# Patient Record
Sex: Female | Born: 1960 | Race: White | Hispanic: No | Marital: Married | Smoking: Former smoker
Health system: Southern US, Community
[De-identification: ages and names within clinical notes are randomized; demographics above are authoritative.]

## PROBLEM LIST (undated history)

## (undated) DIAGNOSIS — J189 Pneumonia, unspecified organism: Secondary | ICD-10-CM

## (undated) DIAGNOSIS — Z5189 Encounter for other specified aftercare: Secondary | ICD-10-CM

## (undated) DIAGNOSIS — I639 Cerebral infarction, unspecified: Secondary | ICD-10-CM

## (undated) DIAGNOSIS — C50919 Malignant neoplasm of unspecified site of unspecified female breast: Secondary | ICD-10-CM

## (undated) DIAGNOSIS — E119 Type 2 diabetes mellitus without complications: Secondary | ICD-10-CM

## (undated) DIAGNOSIS — F419 Anxiety disorder, unspecified: Secondary | ICD-10-CM

## (undated) DIAGNOSIS — K219 Gastro-esophageal reflux disease without esophagitis: Secondary | ICD-10-CM

## (undated) DIAGNOSIS — R87629 Unspecified abnormal cytological findings in specimens from vagina: Secondary | ICD-10-CM

## (undated) DIAGNOSIS — M1990 Unspecified osteoarthritis, unspecified site: Secondary | ICD-10-CM

## (undated) DIAGNOSIS — Z87442 Personal history of urinary calculi: Secondary | ICD-10-CM

## (undated) DIAGNOSIS — I10 Essential (primary) hypertension: Secondary | ICD-10-CM

## (undated) DIAGNOSIS — R011 Cardiac murmur, unspecified: Secondary | ICD-10-CM

## (undated) DIAGNOSIS — T7840XA Allergy, unspecified, initial encounter: Secondary | ICD-10-CM

## (undated) HISTORY — DX: Allergy, unspecified, initial encounter: T78.40XA

## (undated) HISTORY — DX: Cerebral infarction, unspecified: I63.9

## (undated) HISTORY — DX: Type 2 diabetes mellitus without complications: E11.9

## (undated) HISTORY — DX: Encounter for other specified aftercare: Z51.89

## (undated) HISTORY — DX: Unspecified osteoarthritis, unspecified site: M19.90

## (undated) HISTORY — DX: Essential (primary) hypertension: I10

## (undated) HISTORY — DX: Unspecified abnormal cytological findings in specimens from vagina: R87.629

## (undated) HISTORY — PX: CHOLECYSTECTOMY: SHX55

## (undated) HISTORY — DX: Malignant neoplasm of unspecified site of unspecified female breast: C50.919

---

## 1998-09-02 ENCOUNTER — Other Ambulatory Visit: Admission: RE | Admit: 1998-09-02 | Discharge: 1998-09-02 | Payer: Self-pay | Admitting: *Deleted

## 2000-04-24 ENCOUNTER — Other Ambulatory Visit: Admission: RE | Admit: 2000-04-24 | Discharge: 2000-04-24 | Payer: Self-pay | Admitting: Family Medicine

## 2001-06-12 ENCOUNTER — Other Ambulatory Visit: Admission: RE | Admit: 2001-06-12 | Discharge: 2001-06-12 | Payer: Self-pay | Admitting: Family Medicine

## 2012-08-05 ENCOUNTER — Encounter: Payer: Self-pay | Admitting: Nurse Practitioner

## 2014-05-26 ENCOUNTER — Telehealth: Payer: Self-pay | Admitting: Family Medicine

## 2014-05-26 NOTE — Telephone Encounter (Signed)
Stp and she is coughing a lot, wtbs today, advised no appts available today offered tomorrow but pt states she will go to the urgent care.

## 2014-11-16 ENCOUNTER — Telehealth: Payer: Self-pay | Admitting: Family Medicine

## 2014-11-16 NOTE — Telephone Encounter (Signed)
Appointment scheduled for 8/3 @ 1:10pm with [facility]. Patient has [facility] and is currently on no medications except doxycycline.

## 2014-12-08 ENCOUNTER — Ambulatory Visit (INDEPENDENT_AMBULATORY_CARE_PROVIDER_SITE_OTHER): Payer: PRIVATE HEALTH INSURANCE | Admitting: Family Medicine

## 2014-12-08 ENCOUNTER — Encounter (INDEPENDENT_AMBULATORY_CARE_PROVIDER_SITE_OTHER): Payer: Self-pay

## 2014-12-08 ENCOUNTER — Encounter: Payer: Self-pay | Admitting: Family Medicine

## 2014-12-08 VITALS — BP 238/142 | HR 105 | Temp 97.5°F | Ht 61.5 in | Wt 150.8 lb

## 2014-12-08 DIAGNOSIS — S3091XA Unspecified superficial injury of lower back and pelvis, initial encounter: Secondary | ICD-10-CM | POA: Diagnosis not present

## 2014-12-08 DIAGNOSIS — R0600 Dyspnea, unspecified: Secondary | ICD-10-CM

## 2014-12-08 DIAGNOSIS — I10 Essential (primary) hypertension: Secondary | ICD-10-CM | POA: Diagnosis not present

## 2014-12-08 DIAGNOSIS — T148 Other injury of unspecified body region: Secondary | ICD-10-CM | POA: Diagnosis not present

## 2014-12-08 DIAGNOSIS — W57XXXA Bitten or stung by nonvenomous insect and other nonvenomous arthropods, initial encounter: Secondary | ICD-10-CM | POA: Diagnosis not present

## 2014-12-08 LAB — POCT CBC
Granulocyte percent: 79.7 %G (ref 37–80)
HEMATOCRIT: 48.4 % — AB (ref 37.7–47.9)
Hemoglobin: 15.8 g/dL (ref 12.2–16.2)
Lymph, poc: 1.9 (ref 0.6–3.4)
MCH, POC: 29.4 pg (ref 27–31.2)
MCHC: 32.8 g/dL (ref 31.8–35.4)
MCV: 89.9 fL (ref 80–97)
MPV: 8.1 fL (ref 0–99.8)
PLATELET COUNT, POC: 240 10*3/uL (ref 142–424)
POC Granulocyte: 8.5 — AB (ref 2–6.9)
POC LYMPH PERCENT: 18.2 %L (ref 10–50)
RBC: 5.38 M/uL (ref 4.04–5.48)
RDW, POC: 12.5 %
WBC: 10.7 10*3/uL — AB (ref 4.6–10.2)

## 2014-12-08 MED ORDER — OLMESARTAN MEDOXOMIL-HCTZ 40-25 MG PO TABS: 1.0000 | ORAL_TABLET | Freq: Every day | ORAL | Status: DC

## 2014-12-08 NOTE — Progress Notes (Signed)
Subjective:  Patient ID: Marilyn Ford, female    DOB: 15-Aug-1960  Age: 54 y.o. MRN: [id number]  CC: Establish Care and Insect Bite   HPI Marieta Ford presents for Tick bite 4 weeks ago.Concerned about possibility of LYmes dx. Describes engorged dog tick. Irritated anad painful at time of removal. Now slight itch only at site on upper back on the right.   Also needs follow-up of hypertension. Patient has no history of headache chest pain or recent cough. Patient also denies symptoms of TIA such as numbness weakness lateralizing. Patient not checking blood pressure at home. Off all meds.   She is a smoker and states that she is not interested in quitting right now. She gets winded rather easily walking a block or more. Sits down to rest frequently.   History Marilyn Ford has a past medical history of Hypertension.   She has past surgical history that includes Cholecystectomy and Cesarean section.   Her family history includes Diabetes in her brother.She reports that she has been smoking Cigarettes.  She started smoking about 15 years ago. She has been smoking about 0.50 packs per day. She does not have any smokeless tobacco history on file. She reports that she does not drink alcohol or use illicit drugs.  No current outpatient prescriptions on file prior to visit.   No current facility-administered medications on file prior to visit.    ROS Review of Systems  Constitutional: Negative for fever, chills, diaphoresis, appetite change, fatigue and unexpected weight change.  HENT: Negative for congestion, ear pain, hearing loss, postnasal drip, rhinorrhea, sneezing, sore throat and trouble swallowing.   Eyes: Negative for pain.  Respiratory: Positive for cough and shortness of breath. Negative for chest tightness.   Cardiovascular: Negative for chest pain and palpitations.  Gastrointestinal: Negative for nausea, vomiting, abdominal pain, diarrhea and constipation.  Genitourinary: Negative  for dysuria, frequency and menstrual problem.  Musculoskeletal: Negative for joint swelling and arthralgias.  Skin: Positive for wound (minimal hyperemia at site of right posterior shoulder tick bite.). Negative for rash.  Neurological: Negative for dizziness, weakness, numbness and headaches.  Psychiatric/Behavioral: Negative for dysphoric mood and agitation.    Objective:  BP 238/142 mmHg  Pulse 105  Temp(Src) 97.5 F (36.4 C) (Oral)  Ht 5' 1.5" (1.562 m)  Wt 150 lb 12.8 oz (68.402 kg)  BMI 28.04 kg/m2 .ext Physical Exam  Constitutional: She is oriented to person, place, and time. She appears well-developed and well-nourished. No distress.  HENT:  Head: Normocephalic and atraumatic.  Right Ear: External ear normal.  Left Ear: External ear normal.  Nose: Nose normal.  Mouth/Throat: Oropharynx is clear and moist.  Eyes: Conjunctivae and EOM are normal. Pupils are equal, round, and reactive to light.  Neck: Normal range of motion. Neck supple. No thyromegaly present.  Cardiovascular: Normal rate, regular rhythm and normal heart sounds.   No murmur heard. Pulmonary/Chest: Effort normal and breath sounds normal. No respiratory distress. She has no wheezes. She has no rales.  Abdominal: Soft. Bowel sounds are normal. She exhibits no distension. There is no tenderness.  Lymphadenopathy:    She has no cervical adenopathy.  Neurological: She is alert and oriented to person, place, and time. She has normal reflexes.  Skin: Skin is warm and dry.  Psychiatric: She has a normal mood and affect. Her behavior is normal. Judgment and thought content normal.    Assessment & Plan:   Kura was seen today for establish care and insect bite.  Diagnoses  and all orders for this visit:  Severe uncontrolled hypertension Orders: -     CMP14+EGFR  Dyspnea Orders: -     PR BREATHING CAPACITY TEST -     CMP14+EGFR  Tick bite Orders: -     POCT CBC -     CMP14+EGFR -     Lyme Ab/Western  Blot Reflex  Other orders -     Discontinue: olmesartan-hydrochlorothiazide (BENICAR HCT) 40-25 MG per tablet; Take 1 tablet by mouth daily.   I am having Ms. Mies maintain her Naproxen-Esomeprazole and multivitamin.  Meds ordered this encounter  Medications  . Naproxen-Esomeprazole 500-20 MG TBEC    Sig: Take 1 tablet by mouth 2 (two) times daily.  . Multiple Vitamin (MULTIVITAMIN) capsule    Sig: Take 1 capsule by mouth daily.  [name] DISCONTD: olmesartan-hydrochlorothiazide (BENICAR HCT) 40-25 MG per tablet    Sig: Take 1 tablet by mouth daily.    Dispense:  30 tablet    Refill:  1   Handout for DASH eating plan and information regarding HTN Given.   Insurance didn't cover benicar so diovan prescribed as a substitute.  Follow-up: Return in about 1 week (around 12/15/2014).  Marilyn Ford, M.D.

## 2014-12-08 NOTE — Patient Instructions (Signed)
DASH Eating Plan DASH stands for "Dietary Approaches to Stop Hypertension." The DASH eating plan is a healthy eating plan that has been shown to reduce high blood pressure (hypertension). Additional health benefits may include reducing the risk of type 2 diabetes mellitus, heart disease, and stroke. The DASH eating plan may also help with weight loss. WHAT DO I NEED TO KNOW ABOUT THE DASH EATING PLAN? For the DASH eating plan, you will follow these general guidelines:  Choose foods with a percent daily value for sodium of less than 5% (as listed on the food label).  Use salt-free seasonings or herbs instead of table salt or sea salt.  Check with your health care provider or pharmacist before using salt substitutes.  Eat lower-sodium products, often labeled as "lower sodium" or "no salt added."  Eat fresh foods.  Eat more vegetables, fruits, and low-fat dairy products.  Choose whole grains. Look for the word "whole" as the first word in the ingredient list.  Choose fish and skinless chicken or turkey more often than red meat. Limit fish, poultry, and meat to 6 oz (170 g) each day.  Limit sweets, desserts, sugars, and sugary drinks.  Choose heart-healthy fats.  Limit cheese to 1 oz (28 g) per day.  Eat more home-cooked food and less restaurant, buffet, and fast food.  Limit fried foods.  Cook foods using methods other than frying.  Limit canned vegetables. If you do use them, rinse them well to decrease the sodium.  When eating at a restaurant, ask that your food be prepared with less salt, or no salt if possible. WHAT FOODS CAN I EAT? Seek help from a dietitian for individual calorie needs. Grains Whole grain or whole wheat bread. Brown rice. Whole grain or whole wheat pasta. Quinoa, bulgur, and whole grain cereals. Low-sodium cereals. Corn or whole wheat flour tortillas. Whole grain cornbread. Whole grain crackers. Low-sodium crackers. Vegetables Fresh or frozen vegetables  (raw, steamed, roasted, or grilled). Low-sodium or reduced-sodium tomato and vegetable juices. Low-sodium or reduced-sodium tomato sauce and paste. Low-sodium or reduced-sodium canned vegetables.  Fruits All fresh, canned (in natural juice), or frozen fruits. Meat and Other Protein Products Ground beef (85% or leaner), grass-fed beef, or beef trimmed of fat. Skinless chicken or turkey. Ground chicken or turkey. Pork trimmed of fat. All fish and seafood. Eggs. Dried beans, peas, or lentils. Unsalted nuts and seeds. Unsalted canned beans. Dairy Low-fat dairy products, such as skim or 1% milk, 2% or reduced-fat cheeses, low-fat ricotta or cottage cheese, or plain low-fat yogurt. Low-sodium or reduced-sodium cheeses. Fats and Oils Tub margarines without trans fats. Light or reduced-fat mayonnaise and salad dressings (reduced sodium). Avocado. Safflower, olive, or canola oils. Natural peanut or almond butter. Other Unsalted popcorn and pretzels. The items listed above may not be a complete list of recommended foods or beverages. Contact your dietitian for more options. WHAT FOODS ARE NOT RECOMMENDED? Grains White bread. White pasta. White rice. Refined cornbread. Bagels and croissants. Crackers that contain trans fat. Vegetables Creamed or fried vegetables. Vegetables in a cheese sauce. Regular canned vegetables. Regular canned tomato sauce and paste. Regular tomato and vegetable juices. Fruits Dried fruits. Canned fruit in light or heavy syrup. Fruit juice. Meat and Other Protein Products Fatty cuts of meat. Ribs, chicken wings, bacon, sausage, bologna, salami, chitterlings, fatback, hot dogs, bratwurst, and packaged luncheon meats. Salted nuts and seeds. Canned beans with salt. Dairy Whole or 2% milk, cream, half-and-half, and cream cheese. Whole-fat or sweetened yogurt. Full-fat   cheeses or blue cheese. Nondairy creamers and whipped toppings. Processed cheese, cheese spreads, or cheese  curds. Condiments Onion and garlic salt, seasoned salt, table salt, and sea salt. Canned and packaged gravies. Worcestershire sauce. Tartar sauce. Barbecue sauce. Teriyaki sauce. Soy sauce, including reduced sodium. Steak sauce. Fish sauce. Oyster sauce. Cocktail sauce. Horseradish. Ketchup and mustard. Meat flavorings and tenderizers. Bouillon cubes. Hot sauce. Tabasco sauce. Marinades. Taco seasonings. Relishes. Fats and Oils Butter, stick margarine, lard, shortening, ghee, and bacon fat. Coconut, palm kernel, or palm oils. Regular salad dressings. Other Pickles and olives. Salted popcorn and pretzels. The items listed above may not be a complete list of foods and beverages to avoid. Contact your dietitian for more information. WHERE CAN I FIND MORE INFORMATION? National Heart, Lung, and Blood Institute: www.nhlbi.nih.gov/health/health-topics/topics/dash/ Document Released: 04/12/2011 Document Revised: 09/07/2013 Document Reviewed: 02/25/2013 ExitCare Patient Information 2015 [employer]. This information is not intended to replace advice given to you by your health care provider. Make sure you discuss any questions you have with your health care provider. Hypertension Hypertension, commonly called high blood pressure, is when the force of blood pumping through your arteries is too strong. Your arteries are the blood vessels that carry blood from your heart throughout your body. A blood pressure reading consists of a higher number over a lower number, such as 110/72. The higher number (systolic) is the pressure inside your arteries when your heart pumps. The lower number (diastolic) is the pressure inside your arteries when your heart relaxes. Ideally you want your blood pressure below 120/80. Hypertension forces your heart to work harder to pump blood. Your arteries may become narrow or stiff. Having hypertension puts you at risk for heart disease, stroke, and other problems.  RISK  FACTORS Some risk factors for high blood pressure are controllable. Others are not.  Risk factors you cannot control include:   Race. You may be at higher risk if you are African American.  Age. Risk increases with age.  Gender. Men are at higher risk than women before age 45 years. After age 65, women are at higher risk than men. Risk factors you can control include:  Not getting enough exercise or physical activity.  Being overweight.  Getting too much fat, sugar, calories, or salt in your diet.  Drinking too much alcohol. SIGNS AND SYMPTOMS Hypertension does not usually cause signs or symptoms. Extremely high blood pressure (hypertensive crisis) may cause headache, anxiety, shortness of breath, and nosebleed. DIAGNOSIS  To check if you have hypertension, your health care provider will measure your blood pressure while you are seated, with your arm held at the level of your heart. It should be measured at least twice using the same arm. Certain conditions can cause a difference in blood pressure between your right and left arms. A blood pressure reading that is higher than normal on one occasion does not mean that you need treatment. If one blood pressure reading is high, ask your health care provider about having it checked again. TREATMENT  Treating high blood pressure includes making lifestyle changes and possibly taking medicine. Living a healthy lifestyle can help lower high blood pressure. You may need to change some of your habits. Lifestyle changes may include:  Following the DASH diet. This diet is high in fruits, vegetables, and whole grains. It is low in salt, red meat, and added sugars.  Getting at least 2 hours of brisk physical activity every week.  Losing weight if necessary.  Not smoking.  Limiting   alcoholic beverages.  Learning ways to reduce stress. If lifestyle changes are not enough to get your blood pressure under control, your health care provider may  prescribe medicine. You may need to take more than one. Work closely with your health care provider to understand the risks and benefits. HOME CARE INSTRUCTIONS  Have your blood pressure rechecked as directed by your health care provider.   Take medicines only as directed by your health care provider. Follow the directions carefully. Blood pressure medicines must be taken as prescribed. The medicine does not work as well when you skip doses. Skipping doses also puts you at risk for problems.   Do not smoke.   Monitor your blood pressure at home as directed by your health care provider. SEEK MEDICAL CARE IF:   You think you are having a reaction to medicines taken.  You have recurrent headaches or feel dizzy.  You have swelling in your ankles.  You have trouble with your vision. SEEK IMMEDIATE MEDICAL CARE IF:  You develop a severe headache or confusion.  You have unusual weakness, numbness, or feel faint.  You have severe chest or abdominal pain.  You vomit repeatedly.  You have trouble breathing. MAKE SURE YOU:   Understand these instructions.  Will watch your condition.  Will get help right away if you are not doing well or get worse. Document Released: 04/23/2005 Document Revised: 09/07/2013 Document Reviewed: 02/13/2013 ExitCare Patient Information 2015 [employer]. This information is not intended to replace advice given to you by your health care provider. Make sure you discuss any questions you have with your health care provider.  

## 2014-12-09 ENCOUNTER — Telehealth: Payer: Self-pay | Admitting: Family Medicine

## 2014-12-09 LAB — CMP14+EGFR
A/G RATIO: 1.3 (ref 1.1–2.5)
ALT: 19 IU/L (ref 0–32)
AST: 14 IU/L (ref 0–40)
Albumin: 3.9 g/dL (ref 3.5–5.5)
Alkaline Phosphatase: 129 IU/L — ABNORMAL HIGH (ref 39–117)
BILIRUBIN TOTAL: 0.4 mg/dL (ref 0.0–1.2)
BUN/Creatinine Ratio: 18 (ref 9–23)
BUN: 13 mg/dL (ref 6–24)
CHLORIDE: 93 mmol/L — AB (ref 97–108)
CO2: 27 mmol/L (ref 18–29)
CREATININE: 0.74 mg/dL (ref 0.57–1.00)
Calcium: 9.5 mg/dL (ref 8.7–10.2)
GFR calc non Af Amer: 93 mL/min/{1.73_m2} (ref >59)
GFR, EST AFRICAN AMERICAN: 107 mL/min/{1.73_m2} (ref >59)
Globulin, Total: 2.9 g/dL (ref 1.5–4.5)
Glucose: 224 mg/dL — ABNORMAL HIGH (ref 65–99)
POTASSIUM: 3.8 mmol/L (ref 3.5–5.2)
Sodium: 141 mmol/L (ref 134–144)
TOTAL PROTEIN: 6.8 g/dL (ref 6.0–8.5)

## 2014-12-09 LAB — LYME AB/WESTERN BLOT REFLEX
LYME DISEASE AB, QUANT, IGM: <0.8 index (ref 0.00–0.79)
Lyme IgG/IgM Ab: <0.91 {ISR} (ref 0.00–0.90)

## 2014-12-09 NOTE — Telephone Encounter (Signed)
Dr Livia Snellen can you address and then route to pool. Pt aware that she will hear from [place] on friday

## 2014-12-10 ENCOUNTER — Other Ambulatory Visit: Payer: Self-pay | Admitting: Family Medicine

## 2014-12-10 MED ORDER — VALSARTAN-HYDROCHLOROTHIAZIDE 160-25 MG PO TABS: 1.0000 | ORAL_TABLET | Freq: Every day | ORAL | Status: DC

## 2014-12-10 NOTE — Telephone Encounter (Signed)
Replacement sent to [facility]

## 2014-12-14 LAB — POCT GLYCOSYLATED HEMOGLOBIN (HGB A1C): HEMOGLOBIN A1C: 6.8

## 2014-12-14 NOTE — Addendum Note (Signed)
Addended by: Wyline Mood on: 12/14/2014 08:53 AM   Modules accepted: Orders

## 2014-12-15 ENCOUNTER — Ambulatory Visit (INDEPENDENT_AMBULATORY_CARE_PROVIDER_SITE_OTHER): Payer: PRIVATE HEALTH INSURANCE | Admitting: Family Medicine

## 2014-12-15 VITALS — BP 194/110 | HR 98 | Temp 97.4°F | Ht 62.0 in | Wt 151.0 lb

## 2014-12-15 DIAGNOSIS — E1069 Type 1 diabetes mellitus with other specified complication: Secondary | ICD-10-CM

## 2014-12-15 DIAGNOSIS — E108 Type 1 diabetes mellitus with unspecified complications: Secondary | ICD-10-CM

## 2014-12-15 DIAGNOSIS — I10 Essential (primary) hypertension: Secondary | ICD-10-CM

## 2014-12-15 DIAGNOSIS — E1065 Type 1 diabetes mellitus with hyperglycemia: Secondary | ICD-10-CM

## 2014-12-15 DIAGNOSIS — IMO0002 Reserved for concepts with insufficient information to code with codable children: Secondary | ICD-10-CM

## 2014-12-15 MED ORDER — METFORMIN HCL ER 500 MG PO TB24: 500.0000 mg | ORAL_TABLET | Freq: Every day | ORAL | Status: DC

## 2014-12-15 MED ORDER — AMLODIPINE BESYLATE 5 MG PO TABS: 5.0000 mg | ORAL_TABLET | Freq: Every day | ORAL | Status: DC

## 2014-12-15 NOTE — Progress Notes (Signed)
Subjective:  Patient ID: Marilyn Ford, female    DOB: 03/12/1961  Age: 54 y.o. MRN: [id number]  CC: Hypertension and Diabetes   HPI Marilyn Ford presents for  follow-up of hypertension. Patient has no history of headache chest pain or shortness of breath or recent cough. Patient also denies symptoms of TIA such as numbness weakness lateralizing. Patient checks  blood pressure at home and has not had any elevated readings recently. Patient denies side effects from his medication. States taking it regularly.  Patient was noted to have an elevated glucose on her lab evaluation at her previous visit. This was followed up with a hemoglobin A1c test. 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 Marilyn Ford also has family members who've had diabetes. Marilyn Ford denies any current symptoms from the diabetes including polyuria polydipsia nausea or hypoglycemic symptoms.  History Marilyn Ford has a past medical history of Hypertension.   Marilyn Ford has past surgical history that includes Cholecystectomy and Cesarean section.   Her family history includes Diabetes in her brother.Marilyn Ford reports that Marilyn Ford has been smoking Cigarettes.  Marilyn Ford started smoking about 15 years ago. Marilyn Ford has been smoking about 0.50 packs per day. Marilyn Ford does not have any smokeless tobacco history on file. Marilyn Ford reports that Marilyn Ford does not drink alcohol or use illicit drugs.  Current Outpatient Prescriptions on File Prior to Visit  Medication Sig Dispense Refill  . Multiple Vitamin (MULTIVITAMIN) capsule Take 1 capsule by mouth daily.    . Naproxen-Esomeprazole 500-20 MG TBEC Take 1 tablet by mouth 2 (two) times daily.    . valsartan-hydrochlorothiazide (DIOVAN HCT) 160-25 MG per tablet Take 1 tablet by mouth daily. 30 tablet 2   No current facility-administered medications on file prior to visit.    ROS Review of Systems  Constitutional: Negative for fever,  chills, diaphoresis, appetite change and fatigue.  HENT: Negative for congestion, ear pain, hearing loss, postnasal drip, rhinorrhea, sore throat and trouble swallowing.   Respiratory: Negative for cough, chest tightness and shortness of breath.   Cardiovascular: Negative for chest pain and palpitations.  Gastrointestinal: Negative for abdominal pain.  Musculoskeletal: Negative for arthralgias.  Skin: Negative for rash.    Objective:  BP 194/110 mmHg  Pulse 98  Temp(Src) 97.4 F (36.3 C) (Oral)  Ht 5\' 2"  (1.575 m)  Wt 151 lb (68.493 kg)  BMI 27.61 kg/m2  BP Readings from Last 3 Encounters:  12/15/14 194/110  12/08/14 238/142    Wt Readings from Last 3 Encounters:  12/15/14 151 lb (68.493 kg)  12/08/14 150 lb 12.8 oz (68.402 kg)     Physical Exam  Constitutional: Marilyn Ford is oriented to person, place, and time. Marilyn Ford appears well-developed and well-nourished. No distress.  HENT:  Head: Normocephalic and atraumatic.  Eyes: Conjunctivae are normal. Pupils are equal, round, and reactive to light.  Neck: Normal range of motion. Neck supple. No thyromegaly present.  Cardiovascular: Normal rate, regular rhythm and normal heart sounds.   No murmur heard. Pulmonary/Chest: Effort normal and breath sounds normal. No respiratory distress. Marilyn Ford has no wheezes. Marilyn Ford has no rales.  Abdominal: Soft. Bowel sounds are normal. Marilyn Ford exhibits no distension. There is no tenderness.  Musculoskeletal: Normal range of motion.  Lymphadenopathy:    Marilyn Ford has no cervical adenopathy.  Neurological: Marilyn Ford is alert and oriented to person, place, and time.  Skin: Skin is warm and dry.  Psychiatric: Marilyn Ford has  a normal mood and affect. Her behavior is normal. Judgment and thought content normal.    Lab Results  Component Value Date   HGBA1C 6.8 12/14/2014    Lab Results  Component Value Date   WBC 10.7* 12/08/2014   HGB 15.8 12/08/2014   HCT 48.4* 12/08/2014   GLUCOSE 224* 12/08/2014   ALT 19 12/08/2014    AST 14 12/08/2014   NA 141 12/08/2014   K 3.8 12/08/2014   CL 93* 12/08/2014   CREATININE 0.74 12/08/2014   BUN 13 12/08/2014   CO2 27 12/08/2014   HGBA1C 6.8 12/14/2014    No results found.  Assessment & Plan:   Marilyn Ford was seen today for hypertension and diabetes.  Diagnoses and all orders for this visit:  Severe uncontrolled hypertension  Type I diabetes mellitus with complication, uncontrolled -     Amb ref to Medical Nutrition Therapy-MNT  Other orders -     amLODipine (NORVASC) 5 MG tablet; Take 1 tablet (5 mg total) by mouth daily. For blood pressure -     metFORMIN (GLUCOPHAGE-XR) 500 MG 24 hr tablet; Take 1 tablet (500 mg total) by mouth daily with breakfast.  I am having Marilyn Ford start on amLODipine and metFORMIN. I am also having her maintain her Naproxen-Esomeprazole, multivitamin, and valsartan-hydrochlorothiazide.  Meds ordered this encounter  Medications  . amLODipine (NORVASC) 5 MG tablet    Sig: Take 1 tablet (5 mg total) by mouth daily. For blood pressure    Dispense:  30 tablet    Refill:  2  . metFORMIN (GLUCOPHAGE-XR) 500 MG 24 hr tablet    Sig: Take 1 tablet (500 mg total) by mouth daily with breakfast.    Dispense:  30 tablet    Refill:  2     Follow-up: Return in about 1 week (around 12/22/2014).  Claretta Fraise, M.D.

## 2014-12-16 ENCOUNTER — Encounter: Payer: Self-pay | Admitting: Family Medicine

## 2014-12-20 ENCOUNTER — Ambulatory Visit: Payer: Self-pay

## 2014-12-23 ENCOUNTER — Encounter: Payer: Self-pay | Admitting: Pharmacist

## 2014-12-23 ENCOUNTER — Ambulatory Visit (INDEPENDENT_AMBULATORY_CARE_PROVIDER_SITE_OTHER): Payer: PRIVATE HEALTH INSURANCE | Admitting: Pharmacist

## 2014-12-23 VITALS — BP 180/99 | HR 92 | Ht 62.0 in | Wt 150.5 lb

## 2014-12-23 DIAGNOSIS — IMO0002 Reserved for concepts with insufficient information to code with codable children: Secondary | ICD-10-CM

## 2014-12-23 DIAGNOSIS — E1165 Type 2 diabetes mellitus with hyperglycemia: Secondary | ICD-10-CM | POA: Diagnosis not present

## 2014-12-23 DIAGNOSIS — I10 Essential (primary) hypertension: Secondary | ICD-10-CM | POA: Diagnosis not present

## 2014-12-23 DIAGNOSIS — E119 Type 2 diabetes mellitus without complications: Secondary | ICD-10-CM | POA: Insufficient documentation

## 2014-12-23 MED ORDER — GLUCOSE BLOOD VI STRP: ORAL_STRIP | Status: DC

## 2014-12-23 MED ORDER — ACCU-CHEK SOFTCLIX LANCETS MISC: Status: DC

## 2014-12-23 NOTE — Progress Notes (Signed)
Subjective:    Marilyn Ford is a 54 y.o. female who presents for an initial evaluation of Type 2 diabetes mellitus and for diabetes education.  She was diagnosed with type 2 DM 12/14/2014 based on RBG of 224 and A1c of 6.8%.  Current symptoms/problems include hyperglycemia, polyuria and fatigue and have been improving since she was diagnosed.   Marilyn Ford was started on metformin XR 500mg  1 tablet daily and has tolerated well.  Denies diarrhea or GI distress.  Known diabetic complications: none Cardiovascular risk factors: diabetes mellitus, hypertension and smoking/ tobacco exposure  HTN - patient has not started valsartan HCTZ because she was concerned about taking 2 BP medications and was confused as to if she should take together or separate.  Eye exam current (within one year): no Weight trend: stable Prior visit with dietician: no Current diet: in general, an "unhealthy" diet Current exercise: none but before summer she was walking 3 miles and plans to resume when weather is not so hot  Current monitoring regimen: none Home blood sugar records: not checking currently Any episodes of hypoglycemia? no  Is She on ACE inhibitor or angiotensin II receptor blocker?  Patient prescribed valsartan HCTZ but has not started yet    valsartan + HCTZ (Diovan HCT)  The following portions of the patient's history were reviewed and updated as appropriate: allergies, current medications, past family history, past medical history, past social history, past surgical history and problem list.  Review of Systems Constitutional: positive for fatigue, negative for anorexia, chills, fevers, malaise, sweats and weight loss Gastrointestinal: positive for none, negative for abdominal pain, diarrhea and nausea Behavioral/Psych: positive for bad mood, tobacco use and work related stress, negative for aggressive behavior, anxiety, decreased appetite and illegal drug usage    Objective:    BP 180/99  mmHg  Pulse 92  Ht 5\' 2"  (1.575 m)  Wt 150 lb 8 oz (68.266 kg)  BMI 27.52 kg/m2  A1c was 6.8% (12/14/2014)  Lab Review GLUCOSE (mg/dL)  Date Value  12/08/2014 224*   CO2 (mmol/L)  Date Value  12/08/2014 27   BUN (mg/dL)  Date Value  12/08/2014 13   CREATININE, SER (mg/dL)  Date Value  12/08/2014 0.74      ssessment:    Diabetes Mellitus type II, under inadequate control.   HTN -  Uncontrolled and has not started ARB diuretic combo.  Plan:    1.  Rx changes: continue metformin XR 500mg  1 tablet daily    Start valsartan HCTZ take 1 tablet each morning   Continue amlodipine 5mg  - take 1 tablet each evening. 2.  Education: Reviewed 'ABCs' of diabetes management (respective goals in parentheses):  A1C (<7), blood pressure (<130/80), and cholesterol (LDL <100). 3.  Compliance at present is estimated to be good. Efforts to improve compliance (if necessary) will be directed at dietary modifications: discuss CHO counting diet.  Patient appears to have good understandin gof CHO counting and how to measure serving sizes.  And increase exercise. 4.  Patient is given Aviva Plus glucometer in office today and taught how to use.  Discuss BG targets pre and post meal.  Patient advised to check BG once daily at varying times and to record and bring to next appt.  Rx for test strips and lancet sent to pharmacy. 5.  Reminded to get influenza vaccines in Fall 2016 6.  Discussed foot care - patient sees Dr Irving Shows already.  To checked feet daily. 7.  Reminded about importance  of eye exam yearly.  8.   Follow up: 1 month    Cherre Robins, PharmD, CPP, CDE

## 2015-01-24 ENCOUNTER — Ambulatory Visit: Payer: Self-pay | Admitting: Pharmacist

## 2015-03-12 ENCOUNTER — Encounter (HOSPITAL_COMMUNITY): Payer: Self-pay | Admitting: *Deleted

## 2015-03-12 ENCOUNTER — Emergency Department (HOSPITAL_COMMUNITY): Payer: PRIVATE HEALTH INSURANCE

## 2015-03-12 ENCOUNTER — Inpatient Hospital Stay (HOSPITAL_COMMUNITY): Payer: PRIVATE HEALTH INSURANCE

## 2015-03-12 ENCOUNTER — Inpatient Hospital Stay (HOSPITAL_COMMUNITY)
Admission: EM | Admit: 2015-03-12 | Discharge: 2015-03-14 | DRG: 065 | Disposition: A | Discharge status: Home Health | Payer: PRIVATE HEALTH INSURANCE | Attending: Internal Medicine | Admitting: Internal Medicine

## 2015-03-12 DIAGNOSIS — R51 Headache: Secondary | ICD-10-CM | POA: Diagnosis present

## 2015-03-12 DIAGNOSIS — R531 Weakness: Secondary | ICD-10-CM | POA: Diagnosis present

## 2015-03-12 DIAGNOSIS — Z79899 Other long term (current) drug therapy: Secondary | ICD-10-CM | POA: Diagnosis not present

## 2015-03-12 DIAGNOSIS — I10 Essential (primary) hypertension: Secondary | ICD-10-CM | POA: Diagnosis present

## 2015-03-12 DIAGNOSIS — E860 Dehydration: Secondary | ICD-10-CM | POA: Diagnosis present

## 2015-03-12 DIAGNOSIS — Z7982 Long term (current) use of aspirin: Secondary | ICD-10-CM | POA: Diagnosis not present

## 2015-03-12 DIAGNOSIS — E1165 Type 2 diabetes mellitus with hyperglycemia: Secondary | ICD-10-CM | POA: Diagnosis present

## 2015-03-12 DIAGNOSIS — I639 Cerebral infarction, unspecified: Secondary | ICD-10-CM | POA: Diagnosis present

## 2015-03-12 DIAGNOSIS — Z7984 Long term (current) use of oral hypoglycemic drugs: Secondary | ICD-10-CM

## 2015-03-12 DIAGNOSIS — G8191 Hemiplegia, unspecified affecting right dominant side: Secondary | ICD-10-CM | POA: Diagnosis present

## 2015-03-12 DIAGNOSIS — D751 Secondary polycythemia: Secondary | ICD-10-CM | POA: Diagnosis present

## 2015-03-12 DIAGNOSIS — E1101 Type 2 diabetes mellitus with hyperosmolarity with coma: Secondary | ICD-10-CM | POA: Diagnosis not present

## 2015-03-12 DIAGNOSIS — Z9114 Patient's other noncompliance with medication regimen: Secondary | ICD-10-CM | POA: Diagnosis not present

## 2015-03-12 DIAGNOSIS — Z720 Tobacco use: Secondary | ICD-10-CM | POA: Diagnosis present

## 2015-03-12 DIAGNOSIS — E119 Type 2 diabetes mellitus without complications: Secondary | ICD-10-CM | POA: Diagnosis present

## 2015-03-12 DIAGNOSIS — I63432 Cerebral infarction due to embolism of left posterior cerebral artery: Principal | ICD-10-CM | POA: Diagnosis present

## 2015-03-12 DIAGNOSIS — F17210 Nicotine dependence, cigarettes, uncomplicated: Secondary | ICD-10-CM | POA: Diagnosis present

## 2015-03-12 DIAGNOSIS — E785 Hyperlipidemia, unspecified: Secondary | ICD-10-CM | POA: Diagnosis present

## 2015-03-12 DIAGNOSIS — M6289 Other specified disorders of muscle: Secondary | ICD-10-CM | POA: Diagnosis not present

## 2015-03-12 DIAGNOSIS — I6789 Other cerebrovascular disease: Secondary | ICD-10-CM | POA: Diagnosis not present

## 2015-03-12 DIAGNOSIS — I6300 Cerebral infarction due to thrombosis of unspecified precerebral artery: Secondary | ICD-10-CM | POA: Diagnosis not present

## 2015-03-12 DIAGNOSIS — I63332 Cerebral infarction due to thrombosis of left posterior cerebral artery: Secondary | ICD-10-CM | POA: Diagnosis not present

## 2015-03-12 DIAGNOSIS — H538 Other visual disturbances: Secondary | ICD-10-CM | POA: Diagnosis present

## 2015-03-12 DIAGNOSIS — E118 Type 2 diabetes mellitus with unspecified complications: Secondary | ICD-10-CM | POA: Diagnosis not present

## 2015-03-12 LAB — GLUCOSE, CAPILLARY
GLUCOSE-CAPILLARY: 247 mg/dL — AB (ref 65–99)
Glucose-Capillary: 229 mg/dL — ABNORMAL HIGH (ref 65–99)

## 2015-03-12 LAB — COMPREHENSIVE METABOLIC PANEL
ALBUMIN: 4.3 g/dL (ref 3.5–5.0)
ALK PHOS: 104 U/L (ref 38–126)
ALT: 24 U/L (ref 14–54)
ANION GAP: 18 — AB (ref 5–15)
AST: 26 U/L (ref 15–41)
BILIRUBIN TOTAL: 0.6 mg/dL (ref 0.3–1.2)
BUN: 20 mg/dL (ref 6–20)
CALCIUM: 10.6 mg/dL — AB (ref 8.9–10.3)
CO2: 23 mmol/L (ref 22–32)
Chloride: 97 mmol/L — ABNORMAL LOW (ref 101–111)
Creatinine, Ser: 0.85 mg/dL (ref 0.44–1.00)
GLUCOSE: 177 mg/dL — AB (ref 65–99)
POTASSIUM: 3.8 mmol/L (ref 3.5–5.1)
Sodium: 138 mmol/L (ref 135–145)
TOTAL PROTEIN: 8.3 g/dL — AB (ref 6.5–8.1)

## 2015-03-12 LAB — CBC WITH DIFFERENTIAL/PLATELET
BASOS ABS: 0.1 10*3/uL (ref 0.0–0.1)
Basophils Relative: 1 %
EOS PCT: 1 %
Eosinophils Absolute: 0.1 10*3/uL (ref 0.0–0.7)
HEMATOCRIT: 48.3 % — AB (ref 36.0–46.0)
HEMOGLOBIN: 17.5 g/dL — AB (ref 12.0–15.0)
LYMPHS PCT: 14 %
Lymphs Abs: 1.7 10*3/uL (ref 0.7–4.0)
MCH: 32.5 pg (ref 26.0–34.0)
MCHC: 36.2 g/dL — AB (ref 30.0–36.0)
MCV: 89.6 fL (ref 78.0–100.0)
MONO ABS: 0.7 10*3/uL (ref 0.1–1.0)
MONOS PCT: 6 %
Neutro Abs: 9.2 10*3/uL — ABNORMAL HIGH (ref 1.7–7.7)
Neutrophils Relative %: 78 %
Platelets: DECREASED 10*3/uL (ref 150–400)
RBC: 5.39 MIL/uL — ABNORMAL HIGH (ref 3.87–5.11)
RDW: 12.6 % (ref 11.5–15.5)
WBC: 11.8 10*3/uL — ABNORMAL HIGH (ref 4.0–10.5)

## 2015-03-12 LAB — URINALYSIS, ROUTINE W REFLEX MICROSCOPIC
BILIRUBIN URINE: NEGATIVE
Glucose, UA: NEGATIVE mg/dL
Ketones, ur: NEGATIVE mg/dL
LEUKOCYTES UA: NEGATIVE
NITRITE: NEGATIVE
PH: 6.5 (ref 5.0–8.0)
Protein, ur: 100 mg/dL — AB
Specific Gravity, Urine: 1.009 (ref 1.005–1.030)
UROBILINOGEN UA: 0.2 mg/dL (ref 0.0–1.0)

## 2015-03-12 LAB — URINE MICROSCOPIC-ADD ON

## 2015-03-12 LAB — I-STAT TROPONIN, ED: TROPONIN I, POC: 0 ng/mL (ref 0.00–0.08)

## 2015-03-12 MED ORDER — INSULIN ASPART 100 UNIT/ML ~~LOC~~ SOLN: 0.0000 [IU] | SUBCUTANEOUS | Status: DC

## 2015-03-12 MED ORDER — INSULIN ASPART 100 UNIT/ML ~~LOC~~ SOLN
0.0000 [IU] | Freq: Three times a day (TID) | SUBCUTANEOUS | Status: DC
  Administered 2015-03-12: 3 [IU] via SUBCUTANEOUS
  Administered 2015-03-13 – 2015-03-14 (×2): 1 [IU] via SUBCUTANEOUS

## 2015-03-12 MED ORDER — NICOTINE 14 MG/24HR TD PT24
14.0000 mg | MEDICATED_PATCH | Freq: Every day | TRANSDERMAL | Status: DC
Start: 2015-03-12 — End: 2015-03-14
  Administered 2015-03-12 – 2015-03-14 (×3): 14 mg via TRANSDERMAL
  Filled 2015-03-12 (×3): qty 1

## 2015-03-12 MED ORDER — STROKE: EARLY STAGES OF RECOVERY BOOK
Freq: Once | Status: AC
  Administered 2015-03-12: 15:00:00
  Filled 2015-03-12: qty 1

## 2015-03-12 MED ORDER — LABETALOL HCL 5 MG/ML IV SOLN
10.0000 mg | INTRAVENOUS | Status: DC | PRN
  Administered 2015-03-12 (×2): 10 mg via INTRAVENOUS
  Filled 2015-03-12: qty 4

## 2015-03-12 MED ORDER — INSULIN ASPART 100 UNIT/ML ~~LOC~~ SOLN
3.0000 [IU] | Freq: Three times a day (TID) | SUBCUTANEOUS | Status: DC
  Administered 2015-03-12 – 2015-03-14 (×5): 3 [IU] via SUBCUTANEOUS

## 2015-03-12 MED ORDER — SENNOSIDES-DOCUSATE SODIUM 8.6-50 MG PO TABS: 1.0000 | ORAL_TABLET | Freq: Every evening | ORAL | Status: DC | PRN

## 2015-03-12 MED ORDER — SODIUM CHLORIDE 0.9 % IV SOLN
INTRAVENOUS | Status: DC
  Administered 2015-03-12: 15:00:00 via INTRAVENOUS

## 2015-03-12 MED ORDER — LORAZEPAM 2 MG/ML IJ SOLN
0.5000 mg | Freq: Once | INTRAMUSCULAR | Status: AC
  Administered 2015-03-12: 0.5 mg via INTRAVENOUS
  Filled 2015-03-12: qty 1

## 2015-03-12 MED ORDER — AMLODIPINE BESYLATE 5 MG PO TABS
5.0000 mg | ORAL_TABLET | Freq: Every day | ORAL | Status: DC
  Administered 2015-03-12 – 2015-03-14 (×3): 5 mg via ORAL
  Filled 2015-03-12 (×3): qty 1

## 2015-03-12 MED ORDER — ASPIRIN 300 MG RE SUPP: 300.0000 mg | Freq: Every day | RECTAL | Status: DC

## 2015-03-12 MED ORDER — ENOXAPARIN SODIUM 40 MG/0.4ML ~~LOC~~ SOLN
40.0000 mg | SUBCUTANEOUS | Status: DC
  Administered 2015-03-12 – 2015-03-14 (×3): 40 mg via SUBCUTANEOUS
  Filled 2015-03-12 (×3): qty 0.4

## 2015-03-12 MED ORDER — ASPIRIN 325 MG PO TABS
325.0000 mg | ORAL_TABLET | Freq: Every day | ORAL | Status: DC
  Administered 2015-03-12 – 2015-03-14 (×3): 325 mg via ORAL
  Filled 2015-03-12 (×3): qty 1

## 2015-03-12 NOTE — ED Notes (Signed)
Attempted report to [facility]M.  

## 2015-03-12 NOTE — ED Provider Notes (Signed)
CSN: [id number]     Arrival date & time 03/12/15  4098 History   First MD Initiated Contact with Patient 03/12/15 [phone]     Chief Complaint  Patient presents with  . Cerebrovascular Accident     (Consider location/radiation/quality/duration/timing/severity/associated sxs/prior Treatment) Patient is a 54 y.o. female presenting with neurologic complaint.  Neurologic Problem This is a new problem. Episode onset: on awakening this am. The problem occurs constantly. The problem has not changed since onset.Pertinent negatives include no chest pain, no abdominal pain, no headaches and no shortness of breath. Nothing aggravates the symptoms. Nothing relieves the symptoms.    Past Medical History  Diagnosis Date  . Hypertension   . Diabetes mellitus without complication [facility])    Past Surgical History  Procedure Laterality Date  . Cholecystectomy    . Cesarean section     Family History  Problem Relation Age of Onset  . Diabetes Brother   . Diabetes Mother    Social History  Substance Use Topics  . Smoking status: Current Every Day Smoker -- 0.50 packs/day    Types: Cigarettes    Start date: 12/08/1999  . Smokeless tobacco: None  . Alcohol Use: No   OB History    No data available     Review of Systems  Respiratory: Negative for shortness of breath.   Cardiovascular: Negative for chest pain.  Gastrointestinal: Negative for abdominal pain.  Neurological: Negative for headaches.  All other systems reviewed and are negative.     Allergies  Penicillins  Home Medications   Prior to Admission medications   Medication Sig Start Date End Date Taking? Authorizing Provider  amLODipine (NORVASC) 5 MG tablet Take 1 tablet (5 mg total) by mouth daily. For blood pressure 12/15/14  Yes Claretta Fraise, MD  ibuprofen (ADVIL,MOTRIN) 200 MG tablet Take 200 mg by mouth every 8 (eight) hours as needed (pain).   Yes Historical Provider, MD  metFORMIN (GLUCOPHAGE-XR) 500 MG 24 hr tablet Take  1 tablet (500 mg total) by mouth daily with breakfast. 12/15/14  Yes Claretta Fraise, MD  Multiple Vitamin (MULTIVITAMIN) capsule Take 1 capsule by mouth daily.   Yes Historical Provider, MD  ACCU-CHEK SOFTCLIX LANCETS lancets Use to check BG once daily 12/23/14   Tammy Eckard, PHARMD  glucose blood (ACCU-CHEK AVIVA PLUS) test strip Use to check BG up to QD 12/23/14   Tammy Eckard, PHARMD   BP 168/82 mmHg  Pulse 89  Temp(Src) 98.2 F (36.8 C) (Oral)  Resp 18  SpO2 98% Physical Exam  Constitutional: She is oriented to person, place, and time. She appears well-developed and well-nourished.  HENT:  Head: Normocephalic and atraumatic.  Right Ear: External ear normal.  Left Ear: External ear normal.  Eyes: Conjunctivae and EOM are normal. Pupils are equal, round, and reactive to light.  Neck: Normal range of motion. Neck supple.  Cardiovascular: Normal rate, regular rhythm, normal heart sounds and intact distal pulses.   Pulmonary/Chest: Effort normal and breath sounds normal.  Abdominal: Soft. Bowel sounds are normal. There is no tenderness.  Musculoskeletal: Normal range of motion.  Neurological: She is alert and oriented to person, place, and time. She has normal strength and normal reflexes. No cranial nerve deficit or sensory deficit (subjective decrease in RUE). Coordination normal. GCS eye subscore is 4. GCS verbal subscore is 5. GCS motor subscore is 6.  Skin: Skin is warm and dry.  Vitals reviewed.   ED Course  Procedures (including critical care time) Labs Review Labs  Reviewed  CBC WITH DIFFERENTIAL/PLATELET - Abnormal; Notable for the following:    WBC 11.8 (*)    RBC 5.39 (*)    Hemoglobin 17.5 (*)    HCT 48.3 (*)    MCHC 36.2 (*)    Neutro Abs 9.2 (*)    All other components within normal limits  COMPREHENSIVE METABOLIC PANEL - Abnormal; Notable for the following:    Chloride 97 (*)    Glucose, Bld 177 (*)    Calcium 10.6 (*)    Total Protein 8.3 (*)    Anion gap 18  (*)    All other components within normal limits  URINALYSIS, ROUTINE W REFLEX MICROSCOPIC (NOT AT [facility]) - Abnormal; Notable for the following:    Hgb urine dipstick SMALL (*)    Protein, ur 100 (*)    All other components within normal limits  GLUCOSE, CAPILLARY - Abnormal; Notable for the following:    Glucose-Capillary 229 (*)    All other components within normal limits  LIPID PANEL - Abnormal; Notable for the following:    Cholesterol 302 (*)    Triglycerides 534 (*)    HDL 39 (*)    All other components within normal limits  GLUCOSE, CAPILLARY - Abnormal; Notable for the following:    Glucose-Capillary 247 (*)    All other components within normal limits  GLUCOSE, CAPILLARY - Abnormal; Notable for the following:    Glucose-Capillary 132 (*)    All other components within normal limits  CBC WITH DIFFERENTIAL/PLATELET - Abnormal; Notable for the following:    Hemoglobin 15.7 (*)    All other components within normal limits  GLUCOSE, CAPILLARY - Abnormal; Notable for the following:    Glucose-Capillary 110 (*)    All other components within normal limits  URINE MICROSCOPIC-ADD ON  HEMOGLOBIN A1C  CBG MONITORING, ED  Randolm Idol, ED    Imaging Review Dg Chest 2 View  03/12/2015  CLINICAL DATA:  54 year old female with history of altered mental status and headache for 1 week. Blurred vision and weakness today. EXAM: CHEST  2 VIEW COMPARISON:  No priors. FINDINGS: Lung volumes are normal. No consolidative airspace disease. No pleural effusions. No pneumothorax. No pulmonary nodule or mass noted. Pulmonary vasculature and the cardiomediastinal silhouette are within normal limits. Atherosclerosis in the thoracic aorta. IMPRESSION: 1.  No radiographic evidence of acute cardiopulmonary disease. 2. Atherosclerosis. Electronically Signed   By: Vinnie Langton M.D.   On: 03/12/2015 10:05   Ct Head Wo Contrast  03/12/2015  CLINICAL DATA:  Right-sided weakness and numbness since  yesterday. [place] out of bed this morning, now with confusion. EXAM: CT HEAD WITHOUT CONTRAST TECHNIQUE: Contiguous axial images were obtained from the base of the skull through the vertex without intravenous contrast. COMPARISON:  None. FINDINGS: Regional soft tissues appear normal. No radiopaque foreign body. No displaced calvarial fracture. Bilateral basal ganglial lacunar infarcts, right greater than left (representative (image 18, series 2). Bilateral basal ganglial calcifications. Scattered periventricular hypodensities compatible with microvascular ischemic disease. The gray-white differentiation is otherwise well maintained without CT evidence of superimposed acute large territory infarct. No intraparenchymal or extra-axial mass or hemorrhage. Unchanged size and configuration of the ventricles and basilar cisterns. No midline shift. There is diffuse increased attenuation intracranial blood pool suggestive of volume depletion. Limited visualization the paranasal sinuses and mastoid air cells is normal. No air-fluid levels. IMPRESSION: Advanced microvascular ischemic disease without definite superimposed acute intracranial process. Electronically Signed   By: Eldridge Abrahams.D.  On: 03/12/2015 10:30   Mr Brain Wo Contrast  03/12/2015  CLINICAL DATA:  54 year old female with hypertension, hyperglycemia. One week of headaches, progressed at 0300 hours with associated right extremity weakness and tingling at that time. Initial encounter. EXAM: MRI HEAD WITHOUT CONTRAST MRA HEAD WITHOUT CONTRAST TECHNIQUE: Multiplanar, multiecho pulse sequences of the brain and surrounding structures were obtained without intravenous contrast. Angiographic images of the head were obtained using MRA technique without contrast. COMPARISON:  Head CT without contrast 1010 hours today. FINDINGS: MRI HEAD FINDINGS 16 mm curvilinear area of restricted diffusion tracking from the posterior left corona radiata through the external capsule  toward the lentiform nuclei. Mild associated T2 and FLAIR hyperintensity. No associated acute hemorrhage or mass effect. Superimposed chronic lacunar infarcts, some with hemosiderin, in the right basal ganglia, bilateral thalami, and anterior limb of the left external capsule. Chronic lacunar infarct in the left pons. There are also superimposed small scattered white matter foci of restricted diffusion in the left occipital lobe best seen on series 4, image 17. Minimal associated T2 and FLAIR hyperintensity with no hemorrhage or mass effect. No right hemisphere or posterior fossa restricted diffusion. Major intracranial vascular flow voids are preserved. No cortical encephalomalacia. No other chronic cerebral blood products. No midline shift, mass effect, evidence of mass lesion, ventriculomegaly, extra-axial collection or acute intracranial hemorrhage. Cervicomedullary junction and pituitary are within normal limits. Negative visualized cervical spine. Visible internal auditory structures appear normal. Mastoids and paranasal sinuses are clear. Negative orbit and scalp soft tissues. Visualized bone marrow signal is within normal limits. MRA HEAD FINDINGS Antegrade flow in the posterior circulation with dominant distal left vertebral artery. Both PICA origins are patent. Patent vertebrobasilar junction. No basilar artery stenosis. SCA and PCA origins are normal. Right PCA branches are within normal limits. Posterior communicating arteries are diminutive or absent. There is a moderate to severe focal stenosis in the left PCA P2 segment (Series 6, image 79). Preserved distal left PCA flow. Antegrade flow in both ICA siphons. No siphon stenosis. Ophthalmic artery origins are normal. Patent carotid termini. Dominant left ACA. ACA origins are normal. Diminutive anterior communicating artery. Visualized ACA branches are within normal limits. Right MCA M1 segment is mildly irregular without stenosis. Visualized MCA  branches are within normal limits. Left MCA origin and M1 segment are patent. Mild M1 segment irregularity without stenosis. Patent left MCA bifurcation. No left MCA branch occlusion identified. IMPRESSION: 1. Acute lacunar infarct extending from the posterior left corona radiata to the external capsule. No hemorrhage or mass effect. 2. Superimposed scattered small acute left PCA territory white matter infarcts in the occipital lobe no hemorrhage or mass effect. Favor synchronous atherosclerotic/small vessel disease rather than sequelae of emboli. 3. Intracranial MRA remarkable for moderate to severe left PCA P2 segment stenosis. No anterior circulation stenosis or left MCA branch occlusion identified. 4. Underlying advanced chronic small vessel ischemia in the bilateral deep gray matter nuclei and brainstem. Electronically Signed   By: Genevie Ann M.D.   On: 03/12/2015 14:14   Mr Jodene Nam Head/brain Wo Cm  03/12/2015  CLINICAL DATA:  54 year old female with hypertension, hyperglycemia. One week of headaches, progressed at 0300 hours with associated right extremity weakness and tingling at that time. Initial encounter. EXAM: MRI HEAD WITHOUT CONTRAST MRA HEAD WITHOUT CONTRAST TECHNIQUE: Multiplanar, multiecho pulse sequences of the brain and surrounding structures were obtained without intravenous contrast. Angiographic images of the head were obtained using MRA technique without contrast. COMPARISON:  Head CT without contrast  1010 hours today. FINDINGS: MRI HEAD FINDINGS 16 mm curvilinear area of restricted diffusion tracking from the posterior left corona radiata through the external capsule toward the lentiform nuclei. Mild associated T2 and FLAIR hyperintensity. No associated acute hemorrhage or mass effect. Superimposed chronic lacunar infarcts, some with hemosiderin, in the right basal ganglia, bilateral thalami, and anterior limb of the left external capsule. Chronic lacunar infarct in the left pons. There are also  superimposed small scattered white matter foci of restricted diffusion in the left occipital lobe best seen on series 4, image 17. Minimal associated T2 and FLAIR hyperintensity with no hemorrhage or mass effect. No right hemisphere or posterior fossa restricted diffusion. Major intracranial vascular flow voids are preserved. No cortical encephalomalacia. No other chronic cerebral blood products. No midline shift, mass effect, evidence of mass lesion, ventriculomegaly, extra-axial collection or acute intracranial hemorrhage. Cervicomedullary junction and pituitary are within normal limits. Negative visualized cervical spine. Visible internal auditory structures appear normal. Mastoids and paranasal sinuses are clear. Negative orbit and scalp soft tissues. Visualized bone marrow signal is within normal limits. MRA HEAD FINDINGS Antegrade flow in the posterior circulation with dominant distal left vertebral artery. Both PICA origins are patent. Patent vertebrobasilar junction. No basilar artery stenosis. SCA and PCA origins are normal. Right PCA branches are within normal limits. Posterior communicating arteries are diminutive or absent. There is a moderate to severe focal stenosis in the left PCA P2 segment (Series 6, image 79). Preserved distal left PCA flow. Antegrade flow in both ICA siphons. No siphon stenosis. Ophthalmic artery origins are normal. Patent carotid termini. Dominant left ACA. ACA origins are normal. Diminutive anterior communicating artery. Visualized ACA branches are within normal limits. Right MCA M1 segment is mildly irregular without stenosis. Visualized MCA branches are within normal limits. Left MCA origin and M1 segment are patent. Mild M1 segment irregularity without stenosis. Patent left MCA bifurcation. No left MCA branch occlusion identified. IMPRESSION: 1. Acute lacunar infarct extending from the posterior left corona radiata to the external capsule. No hemorrhage or mass effect. 2.  Superimposed scattered small acute left PCA territory white matter infarcts in the occipital lobe no hemorrhage or mass effect. Favor synchronous atherosclerotic/small vessel disease rather than sequelae of emboli. 3. Intracranial MRA remarkable for moderate to severe left PCA P2 segment stenosis. No anterior circulation stenosis or left MCA branch occlusion identified. 4. Underlying advanced chronic small vessel ischemia in the bilateral deep gray matter nuclei and brainstem. Electronically Signed   By: Genevie Ann M.D.   On: 03/12/2015 14:14   I have personally reviewed and evaluated these images and lab results as part of my medical decision-making.   EKG Interpretation   Date/Time:  Saturday March 12 2015 09:41:24 EDT Ventricular Rate:  120 PR Interval:  155 QRS Duration: 82 QT Interval:  331 QTC Calculation: 468 R Axis:   4 Text Interpretation:  Sinus tachycardia LAE, consider biatrial enlargement  Probable anteroseptal infarct, old Minimal ST depression, lateral leads ED  PHYSICIAN INTERPRETATION AVAILABLE IN [facility] [place] Confirmed by TEST,  Record ([id number]) on 03/13/2015 9:52:37 AM      MDM   Final diagnoses:  CVA (cerebral infarction)    54 y.o. female with pertinent PMH of prior episode of R sided numbness and CVA symptoms without dx, HTN, DM presents with R sided numbness, tingling, and weakness on awakening this am.  No facial symptoms.  Had a previous episode 4 months ago without clear etiology.  On arrival today vitals and physical  exam as above, no clear focal neuro deficits.  Consulted neurology and medicine for admission  I have reviewed all laboratory and imaging studies if ordered as above  1. CVA (cerebral infarction)         Debby Freiberg, MD 03/13/15 [phone]

## 2015-03-12 NOTE — Consult Note (Signed)
Neurology Consultation Reason for Consult: Stroke Referring Physician: Theodora Blow  CC: Stroke  History is obtained from: Patient  HPI: Marilyn Ford is a 54 y.o. female with a history of hypertension, diabetes who presents with right-sided weakness. She states that she awoke with these symptoms, however she might have had some slurred speech even prior to going to bed. This morning, when she awoke she noticed that she was dragging her right foot and therefore sought further care in the emergency department.   On review of symptoms, it is difficult to pin her down but she states that she just felt "off" yesterday.  LKW: 11/4, unclear time tpa given?: no, outside of window    ROS: A 14 point ROS was performed and is negative except as noted in the HPI.   Past Medical History  Diagnosis Date  . Hypertension   . Diabetes mellitus without complication ([place])      Family History  Problem Relation Age of Onset  . Diabetes Brother   . Diabetes Mother      Social History:  reports that she has been smoking Cigarettes.  She started smoking about 15 years ago. She has been smoking about 0.50 packs per day. She does not have any smokeless tobacco history on file. She reports that she does not drink alcohol or use illicit drugs.   Exam: Current vital signs: BP 138/66 mmHg  Pulse 83  Temp(Src) 98.3 F (36.8 C) (Oral)  Resp 16  SpO2 97% Vital signs in last 24 hours: Temp:  [98.2 F (36.8 C)-98.3 F (36.8 C)] 98.3 F (36.8 C) (11/05 1400) Pulse Rate:  [79-112] 83 (11/05 1600) Resp:  [14-22] 16 (11/05 1600) BP: (138-244)/(66-128) 138/66 mmHg (11/05 1600) SpO2:  [95 %-97 %] 97 % (11/05 1600)  Physical Exam  Constitutional: Appears well-developed and well-nourished.  Psych: Affect appropriate to situation Eyes: No scleral injection HENT: No OP obstrucion Head: Normocephalic.  Cardiovascular: Normal rate and regular rhythm.  Respiratory: Effort normal and breath sounds  normal to anterior ascultation GI: Soft.  No distension. There is no tenderness.  Skin: WDI  Neuro: Mental Status: Patient is awake, alert, oriented to person, place, month, year, and situation. Patient is able to give a clear and coherent history. No signs of aphasia or neglect Cranial Nerves: II: Visual Fields are full. Pupils are equal, round, and reactive to light.   III,IV, VI: EOMI without ptosis or diploplia.  V: Facial sensation is decreased on the right to temperature VII: Facial movement is mild right facial weakness VIII: hearing is intact to voice X: Uvula elevates symmetrically XI: Shoulder shrug is symmetric. XII: tongue is midline without atrophy or fasciculations.  Motor: Tone is normal. Bulk is normal. 5/5 strength was present on the left, she has 4+/5 strength in the right arm and leg Sensory: Sensation is diminished on the right to temperature Cerebellar: FNF and HKS are intact on left, consistent with weakness on right  I have reviewed labs in epic and the results pertinent to this consultation are: cmp - mildly high calcium  I have reviewed the images obtained:MRI brain two areas of infarction   Impression: 54 yo F with two separate infarcts, embolic vs simultaneous small vessel infarction. She will need PT and risk factor modification. I have encouraged her to stop smoking.   Recommendations: 1. HgbA1c, fasting lipid panel 2. Frequent neuro checks 3. Echocardiogram 4. Carotid dopplers 5. Prophylactic therapy-Antiplatelet med: Aspirin - dose 325mg  PO or 300mg  PR 6.  Risk factor modification 7. Telemetry monitoring 8. PT consult, OT consult, Speech consult    Roland Rack, MD [facility] [phone]  If 7pm- 7am, please page neurology on call as listed in [place].

## 2015-03-12 NOTE — ED Notes (Signed)
Pt states she wasn't feeling like herself yesterday when she was on her way to work. She states on her way she was unable to press down on the brake. Pt states she was still able to ambulate. Pt states she woke up at 0200 this am and fell out of the bed bc she was unable to stand up due to numbness. Pt family states in July pt was suspected to have lyme disease and had facial drooping and stroke sx then. Pt states the test was negative. Pt is having right sided weakness and numbness, family states they feel as if her speech has been affected and her emotions are labile and she seems confused.

## 2015-03-12 NOTE — H&P (Signed)
[facility] History and Physical                                                                                    Marilyn Ford, is a 54 y.o. female  MRN: [id number]   DOB - 07-07-1960  Admit Date - 03/12/2015  Outpatient Primary MD for the patient is Claretta Fraise, MD  Referring Physician:  Dr. Colin Rhein  Chief Complaint:   Chief Complaint  Patient presents with  . 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 The patient reports that she has had a headache on the left side of her head each night this week. It has woken her at 3 AM each night and she's taken Advil for it. She hasn't been eating or drinking well this week. For the last several days she feels as though her peripheral vision has decreased. Last night at approximately 2 AM she woke up because her right leg was tingling. She got up to go to the bathroom and fell into the wall hitting her head. She realized that she was unable to move her right leg. Her right arm was also weak. Her family mentions that her speech was "low". The patient also mentions that this past August she had an episode of facial droop and drooling with slurred speech. Her physician thought she had Lyme disease and treated her with doxycycline. Her "Lyme test " was negative.  The patient reports that she was put on a second blood pressure medication approximately 3 months ago, but her blood pressure has not come down and the medication made her feel ill so she stopped taking it. Her blood pressures have been elevated at home. Also her CBGs have been approximately 150 but she does not check them on a regular basis. She smokes half a pack of Stewart daily and drinks 4-5 beers per night.   Review of Systems  Constitutional: Positive for malaise/fatigue.  Eyes: Positive for  blurred vision.  Respiratory: Negative.   Cardiovascular: Negative.   Gastrointestinal: Negative.   Genitourinary: Negative.   Musculoskeletal: Positive for falls.  Skin: Negative.   Neurological: Positive for dizziness, tingling, speech change, focal weakness, weakness and headaches.  Endo/Heme/Allergies: Negative.   Psychiatric/Behavioral: Negative.      Past Medical History  Past Medical History  Diagnosis Date  . Hypertension   . Diabetes mellitus without complication [facility])     Past Surgical History  Procedure Laterality Date  . Cholecystectomy    . Cesarean section        Social History Social History  Substance Use Topics  . Smoking status: Current Every Day Smoker -- 0.50 packs/day    Types: Cigarettes    Start date: 12/08/1999  . Smokeless tobacco: Not on file  . Alcohol Use: No   she drinks 4-5 beers per night. She smokes half pack of cigarettes a day and has for the past 35 years. She lives at home with her husband. She is independent with ADLs. She has an  active life with multiple grandchildren.  Family History Family History  Problem Relation Age of Onset  . Diabetes Brother   . Diabetes Mother    mother is alive at 45 with no known health problems. Father is deceased, cause of death unknown by the patient at this time  Prior to Admission medications   Medication Sig Start Date End Date Taking? Authorizing Provider  amLODipine (NORVASC) 5 MG tablet Take 1 tablet (5 mg total) by mouth daily. For blood pressure 12/15/14  Yes Claretta Fraise, MD  ibuprofen (ADVIL,MOTRIN) 200 MG tablet Take 200 mg by mouth every 8 (eight) hours as needed (pain).   Yes Historical Provider, MD  metFORMIN (GLUCOPHAGE-XR) 500 MG 24 hr tablet Take 1 tablet (500 mg total) by mouth daily with breakfast. 12/15/14  Yes Claretta Fraise, MD  Multiple Vitamin (MULTIVITAMIN) capsule Take 1 capsule by mouth daily.   Yes Historical Provider, MD  ACCU-CHEK SOFTCLIX LANCETS lancets Use to check  BG once daily 12/23/14   Tammy Eckard, PHARMD  glucose blood (ACCU-CHEK AVIVA PLUS) test strip Use to check BG up to QD 12/23/14   Tammy Eckard, PHARMD    Allergies  Allergen Reactions  . Penicillins Shortness Of Breath    Physical Exam  Vitals  Blood pressure 183/98, pulse 83, temperature 98.2 F (36.8 C), temperature source Oral, resp. rate 22, SpO2 95 %.   General: Pleasant, well-developed well-nourished lying in bed in NAD, husband at bedside  Psych:  Normal affect and insight, Not Suicidal or Homicidal, Awake Alert, Oriented X 3.  Neuro:  Slightly decreased strength in right upper and lower extremities in comparison to left, symmetric sensation, no tongue deviation, no visual field defects detected, cranial nerves II through XII are grossly intact, except speech is slightly slurred.  ENT:  Ears and Eyes appear Normal, Conjunctivae clear, PER. Moist oral mucosa without erythema or exudates.  Neck:  Supple, No lymphadenopathy appreciated  Respiratory:  Symmetrical chest wall movement, Good air movement bilaterally, CTAB.  Cardiac:  RRR, +2/6 Murmur, no LE edema noted, no JVD.    Abdomen:  Positive bowel sounds, Soft, Non tender, Non distended,  No masses appreciated  Skin:  No Cyanosis, Normal Skin Turgor, No Skin Rash or Bruise.  Extremities:  Able to move all 4.  no effusions.  Data Review  Wt Readings from Last 3 Encounters:  12/23/14 68.266 kg (150 lb 8 oz)  12/15/14 68.493 kg (151 lb)  12/08/14 68.402 kg (150 lb 12.8 oz)    CBC  Recent Labs Lab 03/12/15 0945  WBC 11.8*  HGB 17.5*  HCT 48.3*  PLT PLATELET CLUMPS NOTED ON SMEAR, COUNT APPEARS DECREASED  MCV 89.6  MCH 32.5  MCHC 36.2*  RDW 12.6  LYMPHSABS 1.7  MONOABS 0.7  EOSABS 0.1  BASOSABS 0.1    Chemistries   Recent Labs Lab 03/12/15 0945  NA 138  K 3.8  CL 97*  CO2 23  GLUCOSE 177*  BUN 20  CREATININE 0.85  CALCIUM 10.6*  AST 26  ALT 24  ALKPHOS 104  BILITOT 0.6     Lab  Results  Component Value Date   HGBA1C 6.8 12/14/2014    Urinalysis    Component Value Date/Time   COLORURINE YELLOW 03/12/2015 [place] 03/12/2015 1105   LABSPEC 1.009 03/12/2015 1105   PHURINE 6.5 03/12/2015 [place] 03/12/2015 1105   HGBUR SMALL* 03/12/2015 1105   BILIRUBINUR NEGATIVE 03/12/2015 1105   KETONESUR NEGATIVE 03/12/2015  1105   PROTEINUR 100* 03/12/2015 1105   UROBILINOGEN 0.2 03/12/2015 1105   NITRITE NEGATIVE 03/12/2015 1105   LEUKOCYTESUR NEGATIVE 03/12/2015 1105    Imaging results:   Dg Chest 2 View  03/12/2015  CLINICAL DATA:  54 year old female with history of altered mental status and headache for 1 week. Blurred vision and weakness today. EXAM: CHEST  2 VIEW COMPARISON:  No priors. FINDINGS: Lung volumes are normal. No consolidative airspace disease. No pleural effusions. No pneumothorax. No pulmonary nodule or mass noted. Pulmonary vasculature and the cardiomediastinal silhouette are within normal limits. Atherosclerosis in the thoracic aorta. IMPRESSION: 1.  No radiographic evidence of acute cardiopulmonary disease. 2. Atherosclerosis. Electronically Signed   By: Vinnie Langton M.D.   On: 03/12/2015 10:05   Ct Head Wo Contrast  03/12/2015  CLINICAL DATA:  Right-sided weakness and numbness since yesterday. [place] out of bed this morning, now with confusion. EXAM: CT HEAD WITHOUT CONTRAST TECHNIQUE: Contiguous axial images were obtained from the base of the skull through the vertex without intravenous contrast. COMPARISON:  None. FINDINGS: Regional soft tissues appear normal. No radiopaque foreign body. No displaced calvarial fracture. Bilateral basal ganglial lacunar infarcts, right greater than left (representative (image 18, series 2). Bilateral basal ganglial calcifications. Scattered periventricular hypodensities compatible with microvascular ischemic disease. The gray-white differentiation is otherwise well maintained without CT  evidence of superimposed acute large territory infarct. No intraparenchymal or extra-axial mass or hemorrhage. Unchanged size and configuration of the ventricles and basilar cisterns. No midline shift. There is diffuse increased attenuation intracranial blood pool suggestive of volume depletion. Limited visualization the paranasal sinuses and mastoid air cells is normal. No air-fluid levels. IMPRESSION: Advanced microvascular ischemic disease without definite superimposed acute intracranial process. Electronically Signed   By: Sandi Mariscal M.D.   On: 03/12/2015 10:30    My personal review of EKG: sinus tach, ST depression noted multiple leads - Will repeat EKG.   Assessment & Plan  Principal Problem:   CVA (cerebral infarction) Active Problems:   DM (diabetes mellitus), type 2, uncontrolled ([place])   Hypertension   Right sided weakness   Tobacco abuse   CVA Patient with right sided deficits likely due to CVA.  Needs significant lifestyle changes / education. Neurology consulted.  Admitted to neuro telemetry using stroke protocol. MRI/MRA, 2-D echo, carotids, PT/OT evaluations ordered.  Aspirin ordered.  Will defer to neuro for final recs on antiplatelet therapy.    Diabetes Mellitus Patient likely non compliant with insulin.  Med Rec lists only metformin.  Check Hgb A1C.  Will place on Novolog sliding scale with meal coverage while in house.  Hypertension Patient unhappy with current regimen of BP medications (she says there were two, but I don't see a second one listed) Will allow for permissive HTN given acute stroke.  Continue amlodipine.  Add PRN Hydralazine.  Will likely need a new BP regimen at d/c.  Tobacco abuse Counseled to quit.  Nicotine patch ordered.  Regular alcohol use. Drinks 4-5 beers daily.  Patient will need education regarding life style changes.     Consultants Called:    Neurology  Family Communication:     Husband at bedside  Code Status:    Full  Code  Condition:    Guarded.  Potential Disposition:   To home when work up complete - hopefully 11/6  Time spent in minutes : [place],  PA-C on 03/12/2015 at 12:55 PM Between 7am to 7pm -  Pager - [phone] After 7pm go to [email or web address] - password [email or web address] And look for the night coverage person covering me after hours

## 2015-03-12 NOTE — ED Notes (Signed)
Patient transported to MRI 

## 2015-03-13 ENCOUNTER — Inpatient Hospital Stay (HOSPITAL_COMMUNITY): Payer: PRIVATE HEALTH INSURANCE

## 2015-03-13 DIAGNOSIS — I6300 Cerebral infarction due to thrombosis of unspecified precerebral artery: Secondary | ICD-10-CM

## 2015-03-13 DIAGNOSIS — I10 Essential (primary) hypertension: Secondary | ICD-10-CM

## 2015-03-13 DIAGNOSIS — M6289 Other specified disorders of muscle: Secondary | ICD-10-CM

## 2015-03-13 DIAGNOSIS — Z720 Tobacco use: Secondary | ICD-10-CM

## 2015-03-13 DIAGNOSIS — E1165 Type 2 diabetes mellitus with hyperglycemia: Secondary | ICD-10-CM

## 2015-03-13 DIAGNOSIS — E118 Type 2 diabetes mellitus with unspecified complications: Secondary | ICD-10-CM

## 2015-03-13 LAB — LIPID PANEL
CHOL/HDL RATIO: 7.7 ratio
Cholesterol: 302 mg/dL — ABNORMAL HIGH (ref 0–200)
HDL: 39 mg/dL — AB (ref >40)
LDL CALC: UNDETERMINED mg/dL (ref 0–99)
Triglycerides: 534 mg/dL — ABNORMAL HIGH (ref <150)
VLDL: UNDETERMINED mg/dL (ref 0–40)

## 2015-03-13 LAB — GLUCOSE, CAPILLARY
GLUCOSE-CAPILLARY: 132 mg/dL — AB (ref 65–99)
GLUCOSE-CAPILLARY: 110 mg/dL — AB (ref 65–99)
Glucose-Capillary: 110 mg/dL — ABNORMAL HIGH (ref 65–99)
Glucose-Capillary: 85 mg/dL (ref 65–99)

## 2015-03-13 LAB — CBC WITH DIFFERENTIAL/PLATELET
Basophils Absolute: 0.1 10*3/uL (ref 0.0–0.1)
Basophils Relative: 1 %
EOS ABS: 0.2 10*3/uL (ref 0.0–0.7)
EOS PCT: 2 %
HCT: 43.9 % (ref 36.0–46.0)
Hemoglobin: 15.7 g/dL — ABNORMAL HIGH (ref 12.0–15.0)
LYMPHS ABS: 2.2 10*3/uL (ref 0.7–4.0)
Lymphocytes Relative: 26 %
MCH: 32.4 pg (ref 26.0–34.0)
MCHC: 35.8 g/dL (ref 30.0–36.0)
MCV: 90.7 fL (ref 78.0–100.0)
MONO ABS: 0.6 10*3/uL (ref 0.1–1.0)
MONOS PCT: 7 %
Neutro Abs: 5.6 10*3/uL (ref 1.7–7.7)
Neutrophils Relative %: 64 %
PLATELETS: 264 10*3/uL (ref 150–400)
RBC: 4.84 MIL/uL (ref 3.87–5.11)
RDW: 12.8 % (ref 11.5–15.5)
WBC: 8.5 10*3/uL (ref 4.0–10.5)

## 2015-03-13 MED ORDER — FENOFIBRATE 54 MG PO TABS
54.0000 mg | ORAL_TABLET | Freq: Every day | ORAL | Status: DC
  Administered 2015-03-13: 54 mg via ORAL
  Filled 2015-03-13: qty 1

## 2015-03-13 MED ORDER — ATORVASTATIN CALCIUM 40 MG PO TABS
40.0000 mg | ORAL_TABLET | Freq: Every day | ORAL | Status: DC
  Administered 2015-03-13: 40 mg via ORAL
  Filled 2015-03-13: qty 1

## 2015-03-13 MED ORDER — NIACIN 50 MG PO TABS
50.0000 mg | ORAL_TABLET | Freq: Every day | ORAL | Status: DC
  Administered 2015-03-13: 50 mg via ORAL
  Filled 2015-03-13 (×2): qty 1

## 2015-03-13 MED ORDER — HYDRALAZINE HCL 20 MG/ML IJ SOLN: 10.0000 mg | Freq: Four times a day (QID) | INTRAMUSCULAR | Status: DC | PRN

## 2015-03-13 MED ORDER — ATORVASTATIN CALCIUM 80 MG PO TABS
80.0000 mg | ORAL_TABLET | Freq: Every day | ORAL | Status: DC
  Administered 2015-03-14: 80 mg via ORAL
  Filled 2015-03-13: qty 1

## 2015-03-13 NOTE — Progress Notes (Signed)
Utilization Review Completed.Khalil Belote T11/10/2014  

## 2015-03-13 NOTE — Progress Notes (Signed)
STROKE TEAM PROGRESS NOTE   HISTORY Marilyn Ford is a 54 y.o. female with a history of hypertension, diabetes who presented with right-sided weakness. She stated that she awoke with these symptoms, however she might have had some slurred speech even prior to going to bed. The morning of admission, when she awoke she noticed that she was dragging her right foot and therefore sought further care in the emergency department.  On review of symptoms, it was difficult to pin her down but she states that she just felt "off" yesterday.  LKW: 11/4, unclear time tpa given?: no, outside of window   SUBJECTIVE (INTERVAL HISTORY) Her family is at the bedside.  Overall she feels her condition is unchanged. We discussed the need for medical compliance and smoking cessation.  Of note, a family member pulled me aside and explained that patient is not transparent about the amount of ETOH she typically drinks.   OBJECTIVE Temp:  [97.3 F (36.3 C)-98.6 F (37 C)] 97.8 F (36.6 C) (11/06 1004) Pulse Rate:  [79-103] 83 (11/06 1004) Cardiac Rhythm:  [-] Normal sinus rhythm (11/06 0707) Resp:  [14-22] 18 (11/06 1004) BP: (138-205)/(64-120) 156/64 mmHg (11/06 1004) SpO2:  [95 %-100 %] 98 % (11/06 1004)  CBC:   Recent Labs Lab 03/12/15 0945 03/13/15 0728  WBC 11.8* 8.5  NEUTROABS 9.2* 5.6  HGB 17.5* 15.7*  HCT 48.3* 43.9  MCV 89.6 90.7  PLT PLATELET CLUMPS NOTED ON SMEAR, COUNT APPEARS DECREASED 720    Basic Metabolic Panel:   Recent Labs Lab 03/12/15 0945  NA 138  K 3.8  CL 97*  CO2 23  GLUCOSE 177*  BUN 20  CREATININE 0.85  CALCIUM 10.6*    Lipid Panel:     Component Value Date/Time   CHOL 302* 03/13/2015 0438   TRIG 534* 03/13/2015 0438   HDL 39* 03/13/2015 0438   CHOLHDL 7.7 03/13/2015 0438   VLDL UNABLE TO CALCULATE IF TRIGLYCERIDE OVER 400 mg/dL 03/13/2015 0438   LDLCALC UNABLE TO CALCULATE IF TRIGLYCERIDE OVER 400 mg/dL 03/13/2015 0438   HgbA1c:  Lab Results   Component Value Date   HGBA1C 6.8 12/14/2014   Urine Drug Screen: No results found for: LABOPIA, COCAINSCRNUR, LABBENZ, AMPHETMU, THCU, LABBARB       IMAGING  Dg Chest 2 View 03/12/2015   1.  No radiographic evidence of acute cardiopulmonary disease.  2. Atherosclerosis.   Ct Head Wo Contrast 03/12/2015   Advanced microvascular ischemic disease without definite superimposed acute intracranial process.   Mr Marilyn Ford Head/brain Wo Cm 03/12/2015   1. Acute lacunar infarct extending from the posterior left corona radiata to the external capsule. No hemorrhage or mass effect.  2. Superimposed scattered small acute left PCA territory white matter infarcts in the occipital lobe no hemorrhage or mass effect. Favor synchronous atherosclerotic/small vessel disease rather than sequelae of emboli.  3. Intracranial MRA remarkable for moderate to severe left PCA P2 segment stenosis. No anterior circulation stenosis or left MCA branch occlusion identified.  4. Underlying advanced chronic small vessel ischemia in the bilateral deep gray matter nuclei and brainstem.    PHYSICAL EXAM General - Well nourished, well developed, in NAD; she is volitionally poorly responsive and inattentive HEENT:  wnl  Cardiovascular - Regular rate and rhythm Pulmonary: CTA Abdomen: NT, ND, normal bowel sounds Extremities: No C/C/E  Neurological Exam Mental Status: Normal Orientation:  Oriented to person, place and time Speech:  Fluent; no dysarthria Cranial Nerves:  PERRL; EOMI; visual fields  full, face grossly symmetric, hearing grossly intact; shrug symmetric and tongue midline  Motor Exam:  Tone:  Within normal limits; Strength: 5/5 throughout the left side; ride side LE has drift the arm is stronger than the leg  Sensory: Intact to light touch throughout  Coordination:  Intact finger to nose  Gait: Patient able to walk slowly with a walker.  She maintains wide base  ASSESSMENT/PLAN [name] is a 54 y.o. female with history of diabetes mellitus, tobacco use, and hypertension presenting with right hemiparesis and slurred speech. She did not receive IV t-PA due to late presentation.   Stroke:  Dominant infarcts secondary to small vessel disease.  Resultant  Right hemiplegia  MRI  Acute lacunar infarct extending from the posterior left corona radiata to the external capsule.  Superimposed scattered small acute left PCA territory white matter infarcts in the occipital lobe  MRA - moderate to severe left PCA P2 segment stenosis.  Carotid Doppler - pending  2D Echo  pending  LDL - unable to calculate LDL secondary to triglycerides of 534  HgbA1c pending  VTE prophylaxis - Lovenox Diet Carb Modified Fluid consistency:: Thin; Room service appropriate?: Yes  No antithrombotic prior to admission, now on aspirin 325 mg daily  Patient counseled to be compliant with her antithrombotic medications  Ongoing aggressive stroke risk factor management  Therapy recommendations: Pending  Disposition: Pending  Hypertension  Blood pressure somewhat high  Permissive hypertension (OK if < 220/120) but gradually normalize in 5-7 days  Hyperlipidemia  Home meds: No lipid lowering medications prior to admission  LDL could not be calculated secondary to elevated triglycerides, goal < 70  Lipitor 40 mg daily added. Increase Lipitor to 80 mg daily.  Continue statin at discharge  Diabetes  HgbA1c pending, goal < 7.0  Uncontrolled  Other Stroke Risk Factors  Cigarette smoker, advised to stop smoking  ETOH use 4-5 beers per night; watch for ETOH withdrawal  Other Active Problems  Anemia  Leukocytosis  Elevated triglycerides  Elevated Calcium; will check ionized Ca  Hospital day # 1  Mikey Bussing PA-C [facility] Pager [phone] 03/13/2015, 11:16 AM  NEUROLOGY ATTENDING NOTE Patient was seen and examined by me personally. I reviewed  notes, independently viewed imaging studies, participated in medical decision making and plan of care. I have made additions or clarifications directly to the above note.  Documentation accurately reflects findings. The laboratory and radiographic studies were personally reviewed by me.  ROS completed by me personally and pertinent positives fully documented.  Assessment and plan completed by me personally and fully documented above.  Condition is unchanged   I spent 30 minutes of consultative care time with this patient.  SIGNED BY: Dr. Elissa Hefty     To contact Stroke Continuity provider, please refer to [email or web address] After hours, contact General Neurology

## 2015-03-13 NOTE — Progress Notes (Signed)
VASCULAR LAB PRELIMINARY  PRELIMINARY  PRELIMINARY  PRELIMINARY  Carotid duplex completed.    Preliminary report:  1-39% ICA plaquing.  Vertebral artery flow is antegrade.   Rushawn Capshaw, RVT 03/13/2015, 11:47 AM

## 2015-03-13 NOTE — Evaluation (Signed)
Physical Therapy Evaluation Patient Details Name: Marilyn Ford MRN: [id number] DOB: 1961-03-11 Today's Date: 03/13/2015   History of Present Illness    54 y.o. female with a history of hypertension, diabetes who presents 03/12/2015 with right-sided weakness. She states that she awoke with these symptoms, however she might have had some slurred speech even prior to going to bed. This morning, when she awoke she noticed that she was dragging her right foot and therefore sought further care in the emergency department.   Clinical Impression  Pt presents with moderate limitations to functional mobility related to RIGHT side decr motor control, decr sensation, weakness, limiting balance, gait and basic mobility ADLs, requiring minimal to moderate assistance depending on the task.  Pt works as CNA at baseline, is generally active with limitations, now tearful and emotional but motivated to maximize recovery.  Discussed options with pt, they prefer to seek postacute services closer to home in [place].   Recommend CSW to assist with possible short term SNF for rehabilitation of impairments and limitations; up with assist using RW while in hospital; initiate PT in acute setting to facilitate motor and sensory recovery.  See below for details of exam findings.      Follow Up Recommendations SNF    Equipment Recommendations  Rolling walker with 5" wheels    Recommendations for Other Services       Precautions / Restrictions Precautions Precautions: Fall Precaution Comments: up with assist, use walker for BR priviledges Restrictions Weight Bearing Restrictions: No      Mobility  Bed Mobility Overal bed mobility: Modified Independent             General bed mobility comments: incr time but able to come to sitting unassisted  Transfers Overall transfer level: Needs assistance Equipment used: 1 person hand held assist;Rolling walker (2 wheeled) Transfers: Sit to/from Stand Sit to Stand:  Min assist         General transfer comment: cues for safest hand position, and to control speed in setting of new LE deficit; initial instability needing physical assist to steady related to decr motor control RLE  Ambulation/Gait Ambulation/Gait assistance: Mod assist Ambulation Distance (Feet): 75 Feet Assistive device: Rolling walker (2 wheeled) Gait Pattern/deviations: Step-to pattern;Decreased step length - right;Decreased stance time - right;Decreased dorsiflexion - right;Decreased weight shift to right;Trunk flexed;Narrow base of support   Gait velocity interpretation: Below normal speed for age/gender General Gait Details: cues to adjust pace of gait cycle to accomodate delayed RLE incl cue to incr step lenght intact side to promote swing phase RLE; note inversion/supination pattern with limited TA/peroneal activation in DF; pt fatigues after moderate distance.    Stairs            Wheelchair Mobility    Modified Rankin (Stroke Patients Only) Modified Rankin (Stroke Patients Only) Pre-Morbid Rankin Score: No symptoms Modified Rankin: Moderately severe disability     Balance Overall balance assessment: Needs assistance Sitting-balance support: No upper extremity supported;Feet supported Sitting balance-Leahy Scale: Fair Sitting balance - Comments: requires UE support for dynamic activities   Standing balance support: Bilateral upper extremity supported;During functional activity Standing balance-Leahy Scale: Fair Standing balance comment: able to stand quietly short bouts without assist, needs hands on for initiating dynamic activity                             Pertinent Vitals/Pain Pain Assessment: No/denies pain    Home Living Family/patient expects  to be discharged to:: Private residence Living Arrangements: Spouse/significant other;Parent Available Help at Discharge: Family;Available 24 hours/day Type of Home: House         Home  Equipment: None Additional Comments: mother can stay with patient, family willing to take time off work; discussed pros/cons of HH v. 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 intact side, lacks dorsiflexion esp activation of peroneals resulting in excessive inversion/supination of foot with DF attempted       Communication   Communication: No difficulties  Cognition Arousal/Alertness: Awake/alert Behavior During Therapy: WFL for tasks assessed/performed (tearful at end of session) Overall Cognitive Status: Within Functional Limits for tasks assessed                      General Comments      Exercises        Assessment/Plan    PT Assessment Patient needs continued PT services  PT Diagnosis Hemiplegia dominant side   PT Problem List Impaired sensation;Decreased knowledge of precautions;Decreased knowledge of use of DME;Decreased coordination;Decreased mobility;Decreased balance;Decreased range of motion;Decreased strength  PT Treatment Interventions Patient/family education;Therapeutic exercise;Functional mobility training;Therapeutic activities;Balance training;Neuromuscular re-education;Stair training;Gait training;DME instruction   PT Goals (Current goals can be found in the Care Plan section) Acute Rehab PT Goals 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 PT Goal Formulation: With patient/family Time For Goal Achievement: 03/27/15 Potential to Achieve Goals: Good    Frequency Min 4X/week   Barriers to discharge Decreased caregiver  support family can assist minimally for short time; primary caregiver would be mother    Co-evaluation               End of Session Equipment Utilized During Treatment: Gait belt Activity Tolerance: Patient tolerated treatment well Patient left: in bed;with call bell/phone within reach;with nursing/sitter in room;with family/visitor present Nurse Communication: Mobility status         Time: 2542-7062 PT Time Calculation (min) (ACUTE ONLY): 30 min   Charges:   PT Evaluation $Initial PT Evaluation Tier I: 1 Procedure PT Treatments $Gait Training: 8-22 mins   PT G Codes:        Herbie Drape 03/13/2015, 11:05 AM

## 2015-03-13 NOTE — Progress Notes (Signed)
Patient Demographics:    Marilyn Ford, is a 54 y.o. female, DOB - December 08, 1960, VQX:[id number]  Admit date - 03/12/2015   Admitting Physician Cristal Ford, DO  Outpatient Primary MD for the patient is Claretta Fraise, MD  LOS - 1   Chief Complaint  Patient presents with  . Cerebrovascular Accident        Subjective:    Marilyn Ford today has, No headache, No chest pain, No abdominal pain - No Nausea, No new weakness tingling or numbness, No Cough - SOB. Approved but still present right-sided weakness.   Assessment  & Plan :      1. Left PCA territory ischemic CVA - appears embolic, currently on aspirin, LDL above goal with high triglycerides, placed on statin along with niacin. Neurology to evaluate. Full stroke workup underway which will include echogram, carotid duplex, A1c, evaluation by PT-OT and speech. Counseled to quit smoking.   2. History of smoking. Counseled to quit.   3. Essential hypertension. Currently off medications to allow for permissive hypertension due to #1 above. As needed IV hydralazine ordered.   4. Mild polycythemia, likely due to smoking. Stable. Outpatient monitor. Counseled to quit smoking.   5. DM type II. Currently on sliding scale will monitor.  CBG (last 3)   Recent Labs  03/12/15 1615 03/12/15 2120 03/13/15 0640  GLUCAP 229* 247* 132*     Lab Results  Component Value Date   HGBA1C 6.8 12/14/2014      Code Status : Full  Family Communication  : Husband bedside  Disposition Plan  :  home in 1-2 days  Consults  :  Neurology  Procedures  :   CT head and MRI brain confirming Left lacunar infarct in the corona radiate and internal capsule area, also some L. PCA distribution small infarcts.  TTE   Carotid Duplex  DVT Prophylaxis  :   Lovenox    Lab Results  Component Value Date   PLT 264 03/13/2015    Inpatient Medications  Scheduled Meds: . amLODipine  5 mg Oral Daily  . aspirin  300 mg Rectal Daily   Or  . aspirin  325 mg Oral Daily  . atorvastatin  40 mg Oral q1800  . enoxaparin (LOVENOX) injection  40 mg Subcutaneous Q24H  . insulin aspart  0-9 Units Subcutaneous TID WC  . insulin aspart  3 Units Subcutaneous TID WC  . niacin  50 mg Oral QHS  . nicotine  14 mg Transdermal Daily   Continuous Infusions: . sodium chloride 75 mL/hr at 03/12/15 1442   PRN Meds:.senna-docusate  Antibiotics  :     Anti-infectives    None        Objective:   Filed Vitals:   03/12/15 2341 03/13/15 0203 03/13/15 0521 03/13/15 1004  BP: 175/97 178/87 182/89 156/64  Pulse: 81 86 80 83  Temp: 97.7 F (36.5 C) 97.9 F (36.6 C) 97.3 F (36.3 C) 97.8 F (36.6 C)  TempSrc: Oral Oral Oral Oral  Resp: 16 18 18 18   SpO2: 100% 100% 99% 98%    Wt Readings from Last 3 Encounters:  12/23/14 68.266 kg (150 lb 8 oz)  12/15/14 68.493 kg (151 lb)  12/08/14 68.402 kg (150 lb 12.8 oz)  No intake or output data in the 24 hours ending 03/13/15 1102   Physical Exam  Awake Alert, Oriented X 3, mild right-sided hemiparesis, Normal affect [place].AT,PERRAL Supple Neck,No JVD, No cervical lymphadenopathy appriciated.  Symmetrical Chest wall movement, Good air movement bilaterally, CTAB RRR,No Gallops,Rubs or new Murmurs, No Parasternal Heave +ve B.Sounds, Abd Soft, No tenderness, No organomegaly appriciated, No rebound - guarding or rigidity. No Cyanosis, Clubbing or edema, No new Rash or bruise       Data Review:   Micro Results No results found for this or any previous visit (from the past 240 hour(s)).  Radiology Reports Dg Chest 2 View  03/12/2015  CLINICAL DATA:  54 year old female with history of altered mental status and headache for 1 week. Blurred vision and weakness today. EXAM: CHEST  2 VIEW COMPARISON:  No  priors. FINDINGS: Lung volumes are normal. No consolidative airspace disease. No pleural effusions. No pneumothorax. No pulmonary nodule or mass noted. Pulmonary vasculature and the cardiomediastinal silhouette are within normal limits. Atherosclerosis in the thoracic aorta. IMPRESSION: 1.  No radiographic evidence of acute cardiopulmonary disease. 2. Atherosclerosis. Electronically Signed   By: Vinnie Langton M.D.   On: 03/12/2015 10:05   Ct Head Wo Contrast  03/12/2015  CLINICAL DATA:  Right-sided weakness and numbness since yesterday. [place] out of bed this morning, now with confusion. EXAM: CT HEAD WITHOUT CONTRAST TECHNIQUE: Contiguous axial images were obtained from the base of the skull through the vertex without intravenous contrast. COMPARISON:  None. FINDINGS: Regional soft tissues appear normal. No radiopaque foreign body. No displaced calvarial fracture. Bilateral basal ganglial lacunar infarcts, right greater than left (representative (image 18, series 2). Bilateral basal ganglial calcifications. Scattered periventricular hypodensities compatible with microvascular ischemic disease. The gray-white differentiation is otherwise well maintained without CT evidence of superimposed acute large territory infarct. No intraparenchymal or extra-axial mass or hemorrhage. Unchanged size and configuration of the ventricles and basilar cisterns. No midline shift. There is diffuse increased attenuation intracranial blood pool suggestive of volume depletion. Limited visualization the paranasal sinuses and mastoid air cells is normal. No air-fluid levels. IMPRESSION: Advanced microvascular ischemic disease without definite superimposed acute intracranial process. Electronically Signed   By: Sandi Mariscal M.D.   On: 03/12/2015 10:30   Mr Brain Wo Contrast  03/12/2015  CLINICAL DATA:  54 year old female with hypertension, hyperglycemia. One week of headaches, progressed at 0300 hours with associated right extremity  weakness and tingling at that time. Initial encounter. EXAM: MRI HEAD WITHOUT CONTRAST MRA HEAD WITHOUT CONTRAST TECHNIQUE: Multiplanar, multiecho pulse sequences of the brain and surrounding structures were obtained without intravenous contrast. Angiographic images of the head were obtained using MRA technique without contrast. COMPARISON:  Head CT without contrast 1010 hours today. FINDINGS: MRI HEAD FINDINGS 16 mm curvilinear area of restricted diffusion tracking from the posterior left corona radiata through the external capsule toward the lentiform nuclei. Mild associated T2 and FLAIR hyperintensity. No associated acute hemorrhage or mass effect. Superimposed chronic lacunar infarcts, some with hemosiderin, in the right basal ganglia, bilateral thalami, and anterior limb of the left external capsule. Chronic lacunar infarct in the left pons. There are also superimposed small scattered white matter foci of restricted diffusion in the left occipital lobe best seen on series 4, image 17. Minimal associated T2 and FLAIR hyperintensity with no hemorrhage or mass effect. No right hemisphere or posterior fossa restricted diffusion. Major intracranial vascular flow voids are preserved. No cortical encephalomalacia. No other chronic cerebral blood products. No midline  shift, mass effect, evidence of mass lesion, ventriculomegaly, extra-axial collection or acute intracranial hemorrhage. Cervicomedullary junction and pituitary are within normal limits. Negative visualized cervical spine. Visible internal auditory structures appear normal. Mastoids and paranasal sinuses are clear. Negative orbit and scalp soft tissues. Visualized bone marrow signal is within normal limits. MRA HEAD FINDINGS Antegrade flow in the posterior circulation with dominant distal left vertebral artery. Both PICA origins are patent. Patent vertebrobasilar junction. No basilar artery stenosis. SCA and PCA origins are normal. Right PCA branches are  within normal limits. Posterior communicating arteries are diminutive or absent. There is a moderate to severe focal stenosis in the left PCA P2 segment (Series 6, image 79). Preserved distal left PCA flow. Antegrade flow in both ICA siphons. No siphon stenosis. Ophthalmic artery origins are normal. Patent carotid termini. Dominant left ACA. ACA origins are normal. Diminutive anterior communicating artery. Visualized ACA branches are within normal limits. Right MCA M1 segment is mildly irregular without stenosis. Visualized MCA branches are within normal limits. Left MCA origin and M1 segment are patent. Mild M1 segment irregularity without stenosis. Patent left MCA bifurcation. No left MCA branch occlusion identified. IMPRESSION: 1. Acute lacunar infarct extending from the posterior left corona radiata to the external capsule. No hemorrhage or mass effect. 2. Superimposed scattered small acute left PCA territory white matter infarcts in the occipital lobe no hemorrhage or mass effect. Favor synchronous atherosclerotic/small vessel disease rather than sequelae of emboli. 3. Intracranial MRA remarkable for moderate to severe left PCA P2 segment stenosis. No anterior circulation stenosis or left MCA branch occlusion identified. 4. Underlying advanced chronic small vessel ischemia in the bilateral deep gray matter nuclei and brainstem. Electronically Signed   By: Genevie Ann M.D.   On: 03/12/2015 14:14   Mr Jodene Nam Head/brain Wo Cm  03/12/2015  CLINICAL DATA:  54 year old female with hypertension, hyperglycemia. One week of headaches, progressed at 0300 hours with associated right extremity weakness and tingling at that time. Initial encounter. EXAM: MRI HEAD WITHOUT CONTRAST MRA HEAD WITHOUT CONTRAST TECHNIQUE: Multiplanar, multiecho pulse sequences of the brain and surrounding structures were obtained without intravenous contrast. Angiographic images of the head were obtained using MRA technique without contrast.  COMPARISON:  Head CT without contrast 1010 hours today. FINDINGS: MRI HEAD FINDINGS 16 mm curvilinear area of restricted diffusion tracking from the posterior left corona radiata through the external capsule toward the lentiform nuclei. Mild associated T2 and FLAIR hyperintensity. No associated acute hemorrhage or mass effect. Superimposed chronic lacunar infarcts, some with hemosiderin, in the right basal ganglia, bilateral thalami, and anterior limb of the left external capsule. Chronic lacunar infarct in the left pons. There are also superimposed small scattered white matter foci of restricted diffusion in the left occipital lobe best seen on series 4, image 17. Minimal associated T2 and FLAIR hyperintensity with no hemorrhage or mass effect. No right hemisphere or posterior fossa restricted diffusion. Major intracranial vascular flow voids are preserved. No cortical encephalomalacia. No other chronic cerebral blood products. No midline shift, mass effect, evidence of mass lesion, ventriculomegaly, extra-axial collection or acute intracranial hemorrhage. Cervicomedullary junction and pituitary are within normal limits. Negative visualized cervical spine. Visible internal auditory structures appear normal. Mastoids and paranasal sinuses are clear. Negative orbit and scalp soft tissues. Visualized bone marrow signal is within normal limits. MRA HEAD FINDINGS Antegrade flow in the posterior circulation with dominant distal left vertebral artery. Both PICA origins are patent. Patent vertebrobasilar junction. No basilar artery stenosis. SCA and PCA origins  are normal. Right PCA branches are within normal limits. Posterior communicating arteries are diminutive or absent. There is a moderate to severe focal stenosis in the left PCA P2 segment (Series 6, image 79). Preserved distal left PCA flow. Antegrade flow in both ICA siphons. No siphon stenosis. Ophthalmic artery origins are normal. Patent carotid termini. Dominant  left ACA. ACA origins are normal. Diminutive anterior communicating artery. Visualized ACA branches are within normal limits. Right MCA M1 segment is mildly irregular without stenosis. Visualized MCA branches are within normal limits. Left MCA origin and M1 segment are patent. Mild M1 segment irregularity without stenosis. Patent left MCA bifurcation. No left MCA branch occlusion identified. IMPRESSION: 1. Acute lacunar infarct extending from the posterior left corona radiata to the external capsule. No hemorrhage or mass effect. 2. Superimposed scattered small acute left PCA territory white matter infarcts in the occipital lobe no hemorrhage or mass effect. Favor synchronous atherosclerotic/small vessel disease rather than sequelae of emboli. 3. Intracranial MRA remarkable for moderate to severe left PCA P2 segment stenosis. No anterior circulation stenosis or left MCA branch occlusion identified. 4. Underlying advanced chronic small vessel ischemia in the bilateral deep gray matter nuclei and brainstem. Electronically Signed   By: Genevie Ann M.D.   On: 03/12/2015 14:14     CBC  Recent Labs Lab 03/12/15 0945 03/13/15 0728  WBC 11.8* 8.5  HGB 17.5* 15.7*  HCT 48.3* 43.9  PLT PLATELET CLUMPS NOTED ON SMEAR, COUNT APPEARS DECREASED 264  MCV 89.6 90.7  MCH 32.5 32.4  MCHC 36.2* 35.8  RDW 12.6 12.8  LYMPHSABS 1.7 2.2  MONOABS 0.7 0.6  EOSABS 0.1 0.2  BASOSABS 0.1 0.1    Chemistries   Recent Labs Lab 03/12/15 0945  NA 138  K 3.8  CL 97*  CO2 23  GLUCOSE 177*  BUN 20  CREATININE 0.85  CALCIUM 10.6*  AST 26  ALT 24  ALKPHOS 104  BILITOT 0.6   ------------------------------------------------------------------------------------------------------------------ CrCl cannot be calculated (Unknown ideal weight.). ------------------------------------------------------------------------------------------------------------------ No results for input(s): HGBA1C in the last 72  hours. ------------------------------------------------------------------------------------------------------------------  Recent Labs  03/13/15 0438  CHOL 302*  HDL 39*  LDLCALC UNABLE TO CALCULATE IF TRIGLYCERIDE OVER 400 mg/dL  TRIG 534*  CHOLHDL 7.7   ------------------------------------------------------------------------------------------------------------------ No results for input(s): TSH, T4TOTAL, T3FREE, THYROIDAB in the last 72 hours.  Invalid input(s): FREET3 ------------------------------------------------------------------------------------------------------------------ No results for input(s): VITAMINB12, FOLATE, FERRITIN, TIBC, IRON, RETICCTPCT in the last 72 hours.  Coagulation profile No results for input(s): INR, PROTIME in the last 168 hours.  No results for input(s): DDIMER in the last 72 hours.  Cardiac Enzymes No results for input(s): CKMB, TROPONINI, MYOGLOBIN in the last 168 hours.  Invalid input(s): CK ------------------------------------------------------------------------------------------------------------------ Invalid input(s): POCBNP   Time Spent in minutes   35   SINGH,PRASHANT K M.D on 03/13/2015 at 11:02 AM  Between 7am to 7pm - Pager - [phone]  After 7pm go to [email or web address] - password [facility]  [facility] -  Office  [phone]

## 2015-03-14 ENCOUNTER — Inpatient Hospital Stay (HOSPITAL_COMMUNITY): Payer: PRIVATE HEALTH INSURANCE

## 2015-03-14 DIAGNOSIS — Z794 Long term (current) use of insulin: Secondary | ICD-10-CM

## 2015-03-14 DIAGNOSIS — E1101 Type 2 diabetes mellitus with hyperosmolarity with coma: Secondary | ICD-10-CM

## 2015-03-14 DIAGNOSIS — I63332 Cerebral infarction due to thrombosis of left posterior cerebral artery: Secondary | ICD-10-CM

## 2015-03-14 DIAGNOSIS — E785 Hyperlipidemia, unspecified: Secondary | ICD-10-CM

## 2015-03-14 DIAGNOSIS — I6789 Other cerebrovascular disease: Secondary | ICD-10-CM

## 2015-03-14 LAB — GLUCOSE, CAPILLARY
GLUCOSE-CAPILLARY: 143 mg/dL — AB (ref 65–99)
Glucose-Capillary: 114 mg/dL — ABNORMAL HIGH (ref 65–99)

## 2015-03-14 LAB — HEMOGLOBIN A1C
Hgb A1c MFr Bld: 6.8 % — ABNORMAL HIGH (ref 4.8–5.6)
MEAN PLASMA GLUCOSE: 148 mg/dL

## 2015-03-14 MED ORDER — NICOTINE 14 MG/24HR TD PT24: 14.0000 mg | MEDICATED_PATCH | Freq: Every day | TRANSDERMAL | Status: DC

## 2015-03-14 MED ORDER — ATORVASTATIN CALCIUM 80 MG PO TABS: 80.0000 mg | ORAL_TABLET | Freq: Every day | ORAL | Status: DC

## 2015-03-14 MED ORDER — ASPIRIN 81 MG PO TBEC: 81.0000 mg | DELAYED_RELEASE_TABLET | Freq: Every day | ORAL | Status: DC

## 2015-03-14 MED ORDER — CLONIDINE ORAL SUSPENSION 10 MCG/ML
0.1000 mg | Freq: Once | ORAL | Status: AC
  Administered 2015-03-14: 0.1 mg via ORAL
  Filled 2015-03-14 (×2): qty 10

## 2015-03-14 MED ORDER — ALPRAZOLAM 0.5 MG PO TABS
0.5000 mg | ORAL_TABLET | Freq: Once | ORAL | Status: AC
  Administered 2015-03-14: 0.5 mg via ORAL
  Filled 2015-03-14: qty 1

## 2015-03-14 MED ORDER — CLOPIDOGREL BISULFATE 75 MG PO TABS: 75.0000 mg | ORAL_TABLET | Freq: Every day | ORAL | Status: DC

## 2015-03-14 MED ORDER — NIACIN 50 MG PO TABS: 50.0000 mg | ORAL_TABLET | Freq: Every day | ORAL | Status: DC

## 2015-03-14 MED ORDER — ASPIRIN EC 81 MG PO TBEC: 81.0000 mg | DELAYED_RELEASE_TABLET | Freq: Every day | ORAL | Status: DC

## 2015-03-14 NOTE — Progress Notes (Signed)
STROKE TEAM PROGRESS NOTE   SUBJECTIVE (INTERVAL HISTORY) Patient is sitting up in the chair at the bedside, female friend/family member (husband vs s/o) is beside her on the couch. Pt right hand weakness much better  OBJECTIVE Temp:  [97.6 F (36.4 C)-98.4 F (36.9 C)] 98.1 F (36.7 C) (11/07 0857) Pulse Rate:  [84-92] 88 (11/07 0857) Cardiac Rhythm:  [-] Normal sinus rhythm (11/07 0700) Resp:  [18-20] 20 (11/07 0857) BP: (164-181)/(82-96) 169/83 mmHg (11/07 0857) SpO2:  [96 %-98 %] 98 % (11/07 0857)  CBC:   Recent Labs Lab 03/12/15 0945 03/13/15 0728  WBC 11.8* 8.5  NEUTROABS 9.2* 5.6  HGB 17.5* 15.7*  HCT 48.3* 43.9  MCV 89.6 90.7  PLT PLATELET CLUMPS NOTED ON SMEAR, COUNT APPEARS DECREASED 081    Basic Metabolic Panel:   Recent Labs Lab 03/12/15 0945  NA 138  K 3.8  CL 97*  CO2 23  GLUCOSE 177*  BUN 20  CREATININE 0.85  CALCIUM 10.6*    Lipid Panel:     Component Value Date/Time   CHOL 302* 03/13/2015 0438   TRIG 534* 03/13/2015 0438   HDL 39* 03/13/2015 0438   CHOLHDL 7.7 03/13/2015 0438   VLDL UNABLE TO CALCULATE IF TRIGLYCERIDE OVER 400 mg/dL 03/13/2015 0438   LDLCALC UNABLE TO CALCULATE IF TRIGLYCERIDE OVER 400 mg/dL 03/13/2015 0438   HgbA1c:  Lab Results  Component Value Date   HGBA1C 6.8* 03/13/2015   Urine Drug Screen: No results found for: LABOPIA, COCAINSCRNUR, LABBENZ, AMPHETMU, THCU, LABBARB       IMAGING I have personally reviewed the radiological images below and agree with the radiology interpretations.  Dg Chest 2 View 03/12/2015   1.  No radiographic evidence of acute cardiopulmonary disease.  2. Atherosclerosis.   Ct Head Wo Contrast 03/12/2015   Advanced microvascular ischemic disease without definite superimposed acute intracranial process.   Mri & Mra Head/brain Wo Cm 03/12/2015   1. Acute lacunar infarct extending from the posterior left corona radiata to the external capsule. No hemorrhage or mass effect.  2.  Superimposed scattered small acute left PCA territory white matter infarcts in the occipital lobe no hemorrhage or mass effect. Favor synchronous atherosclerotic/small vessel disease rather than sequelae of emboli.  3. Intracranial MRA remarkable for moderate to severe left PCA P2 segment stenosis. No anterior circulation stenosis or left MCA branch occlusion identified.  4. Underlying advanced chronic small vessel ischemia in the bilateral deep gray matter nuclei and brainstem.   Carotid Doppler   There is 1-39% bilateral ICA stenosis. Vertebral artery flow is antegrade.    TTE - - Left ventricle: The cavity size was normal. Wall thickness was increased in a pattern of moderate LVH. Systolic function was normal. The estimated ejection fraction was in the range of 55% to 60%. Doppler parameters are consistent with abnormal left ventricular relaxation (grade 1 diastolic dysfunction).    PHYSICAL EXAM General - Well nourished, well developed, in NAD; she is volitionally poorly responsive and inattentive HEENT:  wnl  Cardiovascular - Regular rate and rhythm Pulmonary: CTA Abdomen: NT, ND, normal bowel sounds Extremities: No C/C/E  Neurological Exam Mental Status: Normal Orientation:  Oriented to person, place and time Speech:  Fluent; no dysarthria Cranial Nerves:  PERRL; EOMI; visual fields full, face grossly symmetric, hearing grossly intact; shrug symmetric and tongue midline Motor Exam:  Tone:  Within normal limits; Strength: 5/5 throughout the left side; ride side LE has drift the arm is stronger than the leg  Sensory: Intact to light touch throughout Coordination:  Intact finger to nose Gait: Patient able to walk slowly with a walker.  She maintains wide base   ASSESSMENT/PLAN [name] is a 54 y.o. female with history of diabetes mellitus, tobacco use, and hypertension presenting with right hemiparesis and slurred speech. She did not receive IV t-PA due  to late presentation.   Stroke:  Dominant L corona radiata/external capsule infarct and small scattered L PCA infarcts with L P2 stensosis secondary to small vessel disease given multiple risk factors. cardioembolic source can not excluded at this time, recommend 30 day cardiac monitoring.  Resultant  Right hemiparesis much improved  MRI  Acute lacunar infarct extending from the posterior left corona radiata to the external capsule. Superimposed scattered small acute left PCA territory white matter infarcts in the occipital lobe  MRA - moderate to severe left PCA P2 segment stenosis.  Carotid Doppler No significant stenosis   2D Echo  EF 55-60%   LDL - unable to calculate LDL secondary to triglycerides of 534  HgbA1c 6.8  VTE prophylaxis - Lovenox Diet Carb Modified Fluid consistency:: Thin; Room service appropriate?: Yes  No antithrombotic prior to admission, now on aspirin 325 mg daily. Given large vessel intracranial atherosclerosis, patient should be treated with aspirin 81 mg and clopidogrel 75 mg orally every day x 3 months for secondary stroke prevention. After 3 months, change to plavix alone. Long-term dual antiplatelets are contraindicated due to risk for intracerebral hemorrhage. Orders changed.   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  Continue statin at discharge  Diabetes  HgbA1c 6.8, goal <  7.0  Uncontrolled  Other Stroke Risk Factors  Cigarette smoker, advised to stop smoking  ETOH use 4-5 beers per night; watch for ETOH withdrawal  Other Active Problems  Anemia  Leukocytosis  Elevated triglycerides  Elevated Calcium; will check ionized Ca  Hospital day # 2  Neurology will sign off. Please call with questions. Pt will follow up with Dr. Erlinda Hong at [facility] in about 1 month. Thanks for the consult.  Rosalin Hawking, MD PhD Stroke Neurology 03/15/2015 12:39 AM   To contact Stroke Continuity provider, please refer to [email or web address] After hours, contact General Neurology

## 2015-03-14 NOTE — Evaluation (Signed)
Occupational Therapy Evaluation Patient Details Name: Marilyn Ford MRN: [id number] DOB: [birth year] Today's Date: 03/14/2015    History of Present Illness 54 y.o. female with a history of hypertension, diabetes who presents 03/12/2015 with right-sided weakness. She states that she awoke with these symptoms, however she might have had some slurred speech even prior to going to bed. This morning, when she awoke she noticed that she was dragging her right foot and therefore sought further care in the emergency department.   Clinical Impression   Patient presenting with decreased ADL and functional mobility independence secondary to above. Patient independent PTA. Patient currently functioning at an overall min assist level. Patient will benefit from acute OT to increase overall independence in the areas of ADLs, functional mobility, and overall safety in order to safely discharge home with HHOT. During OT eval, pt adamantly refused SNF placement.     Follow Up Recommendations  Supervision/Assistance - 24 hour;Home health OT (Pt adamantly refusing SNF placement )    Equipment Recommendations  3 in 1 bedside comode    Recommendations for Other Services  None at this time   Precautions / Restrictions Precautions Precautions: Fall Precaution Comments: up with assist, use walker for BR priviledges Restrictions Weight Bearing Restrictions: No    Mobility Bed Mobility Overal bed mobility: Modified Independent  Transfers Overall transfer level: Needs assistance Equipment used: Rolling walker (2 wheeled) Transfers: Sit to/from [facility] Sit to Stand: Min guard Stand pivot transfers: Min guard       General transfer comment: Min guard and cues for safety    Balance Overall balance assessment: Needs assistance Sitting-balance support: No upper extremity supported Sitting balance-Leahy Scale: Good     Standing balance support: Bilateral upper extremity  supported;During functional activity Standing balance-Leahy Scale: Fair    ADL Overall ADL's : Needs assistance/impaired Eating/Feeding: Set up;Sitting   Grooming: Set up;Sitting   Upper Body Bathing: Set up;Sitting   Lower Body Bathing: Min guard;Sit to/from stand   Upper Body Dressing : Set up;Sitting   Lower Body Dressing: Min guard;Sit to/from stand   Toilet Transfer: Minimal assistance;BSC;RW Functional mobility during ADLs: Minimal assistance;Rolling walker General ADL Comments: Pt limited due to decreased strength in R foot, unable to actively dorsiflex foot. Pt requires cueing for overall safety and encouragement for tasks. Pt needs RW for safe mobility. Pt with supportive family.     Vision Additional Comments: Pt reports her vision is back to normal. Her occular ROM is WFL and peripheral vision is WNL.           Pertinent Vitals/Pain Pain Assessment: No/denies pain     Hand Dominance Right   Extremity/Trunk Assessment Upper Extremity Assessment Upper Extremity Assessment: RUE deficits/detail RUE Deficits / Details: decr sensation, numb but LT intact RUE Sensation: decreased light touch RUE Coordination: decreased fine motor (but able to functionally use)   Lower Extremity Assessment Lower Extremity Assessment: Defer to PT evaluation   Cervical / Trunk Assessment Cervical / Trunk Assessment: Normal   Communication Communication Communication: No difficulties   Cognition Arousal/Alertness: Awake/alert Behavior During Therapy: WFL for tasks assessed/performed Overall Cognitive Status: Within Functional Limits for tasks assessed              Home Living Family/patient expects to be discharged to:: Private residence Living Arrangements: Spouse/significant other;Parent Available Help at Discharge: Family;Available 24 hours/day (has family surrounding her that can provide 24/7 supervision/assistance per husband and patient) Type of Home: House Home  Access: Stairs to enter  Entrance Stairs-Number of Steps: 2 Entrance Stairs-Rails: Left Home Layout: One level     Bathroom Shower/Tub: Corporate investment banker: Standard     Home Equipment: None   Additional Comments: discussed pros/cons of HH v. 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    OT Diagnosis: Generalized weakness   OT Problem List: Decreased strength;Decreased activity tolerance;Impaired balance (sitting and/or standing);Decreased safety awareness;Decreased knowledge of use of DME or AE;Decreased knowledge of precautions   OT Treatment/Interventions: Self-care/ADL training;Therapeutic exercise;Energy conservation;DME and/or AE instruction;Therapeutic activities;Patient/family education;Balance training    OT Goals(Current goals can be found in the care plan section) Acute Rehab OT Goals 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 OT Goal Formulation: With patient/family Time For Goal Achievement: 03/28/15 Potential to Achieve Goals: Good ADL Goals Pt Will Perform Grooming: with modified independence;standing Pt Will Perform Upper Body Bathing: with modified independence;sitting;standing Pt Will Perform Lower Body Bathing: with modified independence;sit to/from stand Pt Will Perform Upper Body Dressing: with modified independence;sitting;standing Pt Will Perform Lower Body Dressing: with modified independence;sit to/from stand Pt Will Transfer to Toilet: ambulating;bedside commode;with supervision Pt Will Perform Tub/Shower Transfer: ambulating;3 in 1;with supervision;Tub transfer;rolling walker  OT Frequency: Min 2X/week   Barriers to D/C: None known at this time   End of Session Equipment Utilized During Treatment: Rolling walker  Activity Tolerance: Patient tolerated treatment well Patient left: in chair;with call bell/phone within reach;with family/visitor  present (discussed importance of calling for assistance and pt NOT getting up on her own)   Time: 0940-7680 OT Time Calculation (min): 18 min Charges:  OT General Charges $OT Visit: 1 Procedure OT Evaluation $Initial OT Evaluation Tier I: 1 Procedure  Sharalyn Lomba , MS, OTR/L, CLT  03/14/2015, 10:14 AM

## 2015-03-14 NOTE — Discharge Summary (Signed)
Marilyn Ford, is a 54 y.o. female  DOB 1961-02-15  MRN [id number].  Admission date:  03/12/2015  Admitting Physician  Cristal Ford, DO  Discharge Date:  03/14/2015   Primary MD  Claretta Fraise, MD  Recommendations for primary care physician for things to follow:   Monitor secondary risk factors for stroke   Admission Diagnosis  CVA (cerebral infarction) [I63.9]   Discharge Diagnosis  CVA (cerebral infarction) [I63.9]    Principal Problem:   CVA (cerebral infarction) Active Problems:   DM (diabetes mellitus), type 2, uncontrolled ([place])   Hypertension   Right sided weakness   Tobacco abuse   Dehydration      Past Medical History  Diagnosis Date  . Hypertension   . Diabetes mellitus without complication [facility])     Past Surgical History  Procedure Laterality Date  . Cholecystectomy    . 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 The patient reports that she has had a headache on the left side of her head each night this week. It has woken her at 3 AM each night and she's taken Advil for it. She hasn't been eating or drinking well this week. For the last several days she feels as though her peripheral vision has decreased. Last night at approximately 2 AM she woke up because her right leg was tingling. She got up to go to the bathroom and fell into the wall hitting her head. She realized that she was unable to move her right leg. Her right arm was also weak. Her family mentions that her speech was "low". The patient also mentions that this past August she had an  episode of facial droop and drooling with slurred speech. Her physician thought she had Lyme disease and treated her with doxycycline. Her "Lyme test " was negative. The patient reports that she was put on a second blood pressure medication approximately 3 months ago, but her blood pressure has not come down and the medication made her feel ill so she stopped taking it. Her blood pressures have been elevated at home. Also her CBGs have been approximately 150 but she does not check them on a regular basis. She smokes half a pack of [place] daily and drinks 4-5 beers per night.      Hospital Course:    1. Left PCA territory ischemic CVA - appears embolic, currently on aspirin, LDL above goal with high triglycerides, placed on statin along with niacin. Neurology saw the patient along with PTOT and speech, we recommended SNF placement but patient refused, she will be placed on aspirin and Plavix and statin, Plavix to be stopped in 3 months thereafter only aspirin and statin, her right-sided deficits have improved but right-sided strength is still 4  over 5. She was Counseled to quit smoking. She will follow with PCP along with neurology post discharge.   2. History of smoking. Counseled to quit.   3. Essential hypertension. Resume home medications post discharge.   4. Mild polycythemia, likely due to smoking. Stable. Outpatient monitor. Counseled to quit smoking. Request PCP to check CBC in 1 week.   5. DM type II. Resume home regimen postdischarge. PCP to monitor A1c and CBG.        Discharge Condition: Stable  Follow UP  Follow-up Information    Follow up with Claretta Fraise, MD.   Specialty:  Family Medicine   Contact information:   [place] [address] [phone]       Follow up with SETHI,PRAMOD, MD. Schedule an appointment as soon as possible for a visit in 1 week.   Specialties:  Neurology, Radiology   Why:  Appointment is 03/30/15 at 2pm. Patient needs  to arrive at 1:30pm.   Contact information:   [address] [place]  [address] [phone]        Consults obtained - Neuro  Diet and Activity recommendation: See Discharge Instructions below  Discharge Instructions           Discharge Instructions    Discharge instructions    Complete by:  As directed   Follow with Primary MD STACKS,WARREN, MD in 7 days   Get CBC, CMP, 2 view Chest X ray checked  by Primary MD next visit.    Activity: As tolerated with Full fall precautions use walker/cane & assistance as needed   Disposition Home     Diet: Heart Healthy Low Carb.  For Heart failure patients - Check your Weight same time everyday, if you gain over 2 pounds, or you develop in leg swelling, experience more shortness of breath or chest pain, call your Primary MD immediately. Follow Cardiac Low Salt Diet and 1.5 lit/day fluid restriction.   On your next visit with your primary care physician please Get Medicines reviewed and adjusted.   Please request your Prim.MD to go over all Hospital Tests and Procedure/Radiological results at the follow up, please get all Hospital records sent to your Prim MD by signing hospital release before you go home.   If you experience worsening of your admission symptoms, develop shortness of breath, life threatening emergency, suicidal or homicidal thoughts you must seek medical attention immediately by calling 911 or calling your MD immediately  if symptoms less severe.  You Must read complete instructions/literature along with all the possible adverse reactions/side effects for all the Medicines you take and that have been prescribed to you. Take any new Medicines after you have completely understood and accpet all the possible adverse reactions/side effects.   Do not drive, operating heavy machinery, perform activities at heights, swimming or participation in water activities or provide baby sitting services if your were  admitted for syncope or siezures until you have seen by Primary MD or a Neurologist and advised to do so again.  Do not drive when taking Pain medications.    Do not take more than prescribed Pain, Sleep and Anxiety Medications  Special Instructions: If you have smoked or chewed Tobacco  in the last 2 yrs please stop smoking, stop any regular Alcohol  and or any Recreational drug use.  Wear Seat belts while driving.   Please note  You were cared for by a hospitalist during your hospital stay. If you have any questions about  your discharge medications or the care you received while you were in the hospital after you are discharged, you can call the unit and asked to speak with the hospitalist on call if the hospitalist that took care of you is not available. Once you are discharged, your primary care physician will handle any further medical issues. Please note that NO REFILLS for any discharge medications will be authorized once you are discharged, as it is imperative that you return to your primary care physician (or establish a relationship with a primary care physician if you do not have one) for your aftercare needs so that they can reassess your need for medications and monitor your lab values.     Increase activity slowly    Complete by:  As directed              Discharge Medications       Medication List    TAKE these medications        ACCU-CHEK SOFTCLIX LANCETS lancets  Use to check BG once daily     amLODipine 5 MG tablet  Commonly known as:  NORVASC  Take 1 tablet (5 mg total) by mouth daily. For blood pressure     aspirin 81 MG EC tablet  Take 1 tablet (81 mg total) by mouth daily.  Start taking on:  03/15/2015     atorvastatin 80 MG tablet  Commonly known as:  LIPITOR  Take 1 tablet (80 mg total) by mouth daily at 6 PM.     clopidogrel 75 MG tablet  Commonly known as:  PLAVIX  Take 1 tablet (75 mg total) by mouth daily.     glucose blood test strip    Commonly known as:  ACCU-CHEK AVIVA PLUS  Use to check BG up to QD     ibuprofen 200 MG tablet  Commonly known as:  ADVIL,MOTRIN  Take 200 mg by mouth every 8 (eight) hours as needed (pain).     metFORMIN 500 MG 24 hr tablet  Commonly known as:  GLUCOPHAGE-XR  Take 1 tablet (500 mg total) by mouth daily with breakfast.     multivitamin capsule  Take 1 capsule by mouth daily.     niacin 50 MG tablet  Take 1 tablet (50 mg total) by mouth at bedtime.     nicotine 14 mg/24hr patch  Commonly known as:  NICODERM CQ - dosed in mg/24 hours  Place 1 patch (14 mg total) onto the skin daily.        Major procedures and Radiology Reports - PLEASE review detailed and final reports for all details, in brief -   TTE   Left ventricle: The cavity size was normal. Wall thickness wasincreased in a pattern of moderate LVH. Systolic function was normal. The estimated ejection fraction was in the range of 55% to 60%. Doppler parameters are consistent with abnormal left ventricular relaxation (grade 1 diastolic dysfunction).   Carotids  Bilateral: intimal wall thickening CCA. Mild soft plaque origin ICA. 1-39% ICA plaquing. Vertebral artery flow is antegrade.    Dg Chest 2 View  03/12/2015  CLINICAL DATA:  54 year old female with history of altered mental status and headache for 1 week. Blurred vision and weakness today. EXAM: CHEST  2 VIEW COMPARISON:  No priors. FINDINGS: Lung volumes are normal. No consolidative airspace disease. No pleural effusions. No pneumothorax. No pulmonary nodule or mass noted. Pulmonary vasculature and the cardiomediastinal silhouette are within normal limits. Atherosclerosis in the thoracic aorta. IMPRESSION:  1.  No radiographic evidence of acute cardiopulmonary disease. 2. Atherosclerosis. Electronically Signed   By: Vinnie Langton M.D.   On: 03/12/2015 10:05   Ct Head Wo Contrast  03/12/2015  CLINICAL DATA:  Right-sided weakness and numbness since yesterday.  [place] out of bed this morning, now with confusion. EXAM: CT HEAD WITHOUT CONTRAST TECHNIQUE: Contiguous axial images were obtained from the base of the skull through the vertex without intravenous contrast. COMPARISON:  None. FINDINGS: Regional soft tissues appear normal. No radiopaque foreign body. No displaced calvarial fracture. Bilateral basal ganglial lacunar infarcts, right greater than left (representative (image 18, series 2). Bilateral basal ganglial calcifications. Scattered periventricular hypodensities compatible with microvascular ischemic disease. The gray-white differentiation is otherwise well maintained without CT evidence of superimposed acute large territory infarct. No intraparenchymal or extra-axial mass or hemorrhage. Unchanged size and configuration of the ventricles and basilar cisterns. No midline shift. There is diffuse increased attenuation intracranial blood pool suggestive of volume depletion. Limited visualization the paranasal sinuses and mastoid air cells is normal. No air-fluid levels. IMPRESSION: Advanced microvascular ischemic disease without definite superimposed acute intracranial process. Electronically Signed   By: Sandi Mariscal M.D.   On: 03/12/2015 10:30   Mr Brain Wo Contrast  03/12/2015  CLINICAL DATA:  54 year old female with hypertension, hyperglycemia. One week of headaches, progressed at 0300 hours with associated right extremity weakness and tingling at that time. Initial encounter. EXAM: MRI HEAD WITHOUT CONTRAST MRA HEAD WITHOUT CONTRAST TECHNIQUE: Multiplanar, multiecho pulse sequences of the brain and surrounding structures were obtained without intravenous contrast. Angiographic images of the head were obtained using MRA technique without contrast. COMPARISON:  Head CT without contrast 1010 hours today. FINDINGS: MRI HEAD FINDINGS 16 mm curvilinear area of restricted diffusion tracking from the posterior left corona radiata through the external capsule toward the  lentiform nuclei. Mild associated T2 and FLAIR hyperintensity. No associated acute hemorrhage or mass effect. Superimposed chronic lacunar infarcts, some with hemosiderin, in the right basal ganglia, bilateral thalami, and anterior limb of the left external capsule. Chronic lacunar infarct in the left pons. There are also superimposed small scattered white matter foci of restricted diffusion in the left occipital lobe best seen on series 4, image 17. Minimal associated T2 and FLAIR hyperintensity with no hemorrhage or mass effect. No right hemisphere or posterior fossa restricted diffusion. Major intracranial vascular flow voids are preserved. No cortical encephalomalacia. No other chronic cerebral blood products. No midline shift, mass effect, evidence of mass lesion, ventriculomegaly, extra-axial collection or acute intracranial hemorrhage. Cervicomedullary junction and pituitary are within normal limits. Negative visualized cervical spine. Visible internal auditory structures appear normal. Mastoids and paranasal sinuses are clear. Negative orbit and scalp soft tissues. Visualized bone marrow signal is within normal limits. MRA HEAD FINDINGS Antegrade flow in the posterior circulation with dominant distal left vertebral artery. Both PICA origins are patent. Patent vertebrobasilar junction. No basilar artery stenosis. SCA and PCA origins are normal. Right PCA branches are within normal limits. Posterior communicating arteries are diminutive or absent. There is a moderate to severe focal stenosis in the left PCA P2 segment (Series 6, image 79). Preserved distal left PCA flow. Antegrade flow in both ICA siphons. No siphon stenosis. Ophthalmic artery origins are normal. Patent carotid termini. Dominant left ACA. ACA origins are normal. Diminutive anterior communicating artery. Visualized ACA branches are within normal limits. Right MCA M1 segment is mildly irregular without stenosis. Visualized MCA branches are  within normal limits. Left MCA origin and M1 segment  are patent. Mild M1 segment irregularity without stenosis. Patent left MCA bifurcation. No left MCA branch occlusion identified. IMPRESSION: 1. Acute lacunar infarct extending from the posterior left corona radiata to the external capsule. No hemorrhage or mass effect. 2. Superimposed scattered small acute left PCA territory white matter infarcts in the occipital lobe no hemorrhage or mass effect. Favor synchronous atherosclerotic/small vessel disease rather than sequelae of emboli. 3. Intracranial MRA remarkable for moderate to severe left PCA P2 segment stenosis. No anterior circulation stenosis or left MCA branch occlusion identified. 4. Underlying advanced chronic small vessel ischemia in the bilateral deep gray matter nuclei and brainstem. Electronically Signed   By: Genevie Ann M.D.   On: 03/12/2015 14:14   Mr Jodene Nam Head/brain Wo Cm  03/12/2015  CLINICAL DATA:  54 year old female with hypertension, hyperglycemia. One week of headaches, progressed at 0300 hours with associated right extremity weakness and tingling at that time. Initial encounter. EXAM: MRI HEAD WITHOUT CONTRAST MRA HEAD WITHOUT CONTRAST TECHNIQUE: Multiplanar, multiecho pulse sequences of the brain and surrounding structures were obtained without intravenous contrast. Angiographic images of the head were obtained using MRA technique without contrast. COMPARISON:  Head CT without contrast 1010 hours today. FINDINGS: MRI HEAD FINDINGS 16 mm curvilinear area of restricted diffusion tracking from the posterior left corona radiata through the external capsule toward the lentiform nuclei. Mild associated T2 and FLAIR hyperintensity. No associated acute hemorrhage or mass effect. Superimposed chronic lacunar infarcts, some with hemosiderin, in the right basal ganglia, bilateral thalami, and anterior limb of the left external capsule. Chronic lacunar infarct in the left pons. There are also superimposed  small scattered white matter foci of restricted diffusion in the left occipital lobe best seen on series 4, image 17. Minimal associated T2 and FLAIR hyperintensity with no hemorrhage or mass effect. No right hemisphere or posterior fossa restricted diffusion. Major intracranial vascular flow voids are preserved. No cortical encephalomalacia. No other chronic cerebral blood products. No midline shift, mass effect, evidence of mass lesion, ventriculomegaly, extra-axial collection or acute intracranial hemorrhage. Cervicomedullary junction and pituitary are within normal limits. Negative visualized cervical spine. Visible internal auditory structures appear normal. Mastoids and paranasal sinuses are clear. Negative orbit and scalp soft tissues. Visualized bone marrow signal is within normal limits. MRA HEAD FINDINGS Antegrade flow in the posterior circulation with dominant distal left vertebral artery. Both PICA origins are patent. Patent vertebrobasilar junction. No basilar artery stenosis. SCA and PCA origins are normal. Right PCA branches are within normal limits. Posterior communicating arteries are diminutive or absent. There is a moderate to severe focal stenosis in the left PCA P2 segment (Series 6, image 79). Preserved distal left PCA flow. Antegrade flow in both ICA siphons. No siphon stenosis. Ophthalmic artery origins are normal. Patent carotid termini. Dominant left ACA. ACA origins are normal. Diminutive anterior communicating artery. Visualized ACA branches are within normal limits. Right MCA M1 segment is mildly irregular without stenosis. Visualized MCA branches are within normal limits. Left MCA origin and M1 segment are patent. Mild M1 segment irregularity without stenosis. Patent left MCA bifurcation. No left MCA branch occlusion identified. IMPRESSION: 1. Acute lacunar infarct extending from the posterior left corona radiata to the external capsule. No hemorrhage or mass effect. 2. Superimposed  scattered small acute left PCA territory white matter infarcts in the occipital lobe no hemorrhage or mass effect. Favor synchronous atherosclerotic/small vessel disease rather than sequelae of emboli. 3. Intracranial MRA remarkable for moderate to severe left PCA P2 segment stenosis. No  anterior circulation stenosis or left MCA branch occlusion identified. 4. Underlying advanced chronic small vessel ischemia in the bilateral deep gray matter nuclei and brainstem. Electronically Signed   By: Genevie Ann M.D.   On: 03/12/2015 14:14    Micro Results      No results found for this or any previous visit (from the past 240 hour(s)).   Today   Subjective    Marilyn Ford today has no headache,no chest abdominal pain,no new weakness tingling or numbness, feels much better wants to go home today.     Objective   Blood pressure 169/83, pulse 84, temperature 98.1 F (36.7 C), temperature source Oral, resp. rate 20, height 5\' 2"  (1.575 m), weight 69.6 kg (153 lb 7 oz), SpO2 100 %.   Intake/Output Summary (Last 24 hours) at 03/14/15 1545 Last data filed at 03/14/15 0900  Gross per 24 hour  Intake    240 ml  Output      0 ml  Net    240 ml    Exam Awake Alert, Oriented x 3, No new F.N deficits, 4 over 5 strength on the right side, Normal affect [place].AT,PERRAL Supple Neck,No JVD, No cervical lymphadenopathy appriciated.  Symmetrical Chest wall movement, Good air movement bilaterally, CTAB RRR,No Gallops,Rubs or new Murmurs, No Parasternal Heave +ve B.Sounds, Abd Soft, Non tender, No organomegaly appriciated, No rebound -guarding or rigidity. No Cyanosis, Clubbing or edema, No new Rash or bruise   Data Review   CBC w Diff:  Lab Results  Component Value Date   WBC 8.5 03/13/2015   WBC 10.7* 12/08/2014   HGB 15.7* 03/13/2015   HGB 15.8 12/08/2014   HCT 43.9 03/13/2015   HCT 48.4* 12/08/2014   PLT 264 03/13/2015   LYMPHOPCT 26 03/13/2015   MONOPCT 7 03/13/2015   EOSPCT 2 03/13/2015    BASOPCT 1 03/13/2015    CMP:  Lab Results  Component Value Date   NA 138 03/12/2015   NA 141 12/08/2014   K 3.8 03/12/2015   CL 97* 03/12/2015   CO2 23 03/12/2015   BUN 20 03/12/2015   BUN 13 12/08/2014   CREATININE 0.85 03/12/2015   PROT 8.3* 03/12/2015   PROT 6.8 12/08/2014   ALBUMIN 4.3 03/12/2015   ALBUMIN 3.9 12/08/2014   BILITOT 0.6 03/12/2015   BILITOT 0.4 12/08/2014   ALKPHOS 104 03/12/2015   AST 26 03/12/2015   ALT 24 03/12/2015   Lab Results  Component Value Date   HGBA1C 6.8* 03/13/2015    Lab Results  Component Value Date   CHOL 302* 03/13/2015   HDL 39* 03/13/2015   LDLCALC UNABLE TO CALCULATE IF TRIGLYCERIDE OVER 400 mg/dL 03/13/2015   TRIG 534* 03/13/2015   CHOLHDL 7.7 03/13/2015     Total Time in preparing paper work, data evaluation and todays exam - 35 minutes  Lala Lund K M.D on 03/14/2015 at 3:45 PM  [facility]   Office  [phone]

## 2015-03-14 NOTE — Progress Notes (Signed)
Patient being discharged home with orders for home health and PT, discharge summary reviewed prescription given, IV removed patient is alert and oriented with no complaints of pain husband is driving her home and will remain with her.

## 2015-03-14 NOTE — Care Management Note (Signed)
Case Management Note  Patient Details  Name: DANAE OLAND MRN: [id number] Date of Birth: 04-23-1961  Subjective/Objective:                    Action/Plan: Met with patient to discuss discharge planning. Patient is agreeable to home health services and has chosen [facility].  Miranda with [facility] was notified and has accepted the referral for discharge home today. [facility] DME was notified of need for equipment prior to discharge home today.  Expected Discharge Date:                  Expected Discharge Plan:  [place]  In-House Referral:     Discharge planning Services  CM Consult  Post Acute Care Choice:  Durable Medical Equipment, Home Health Choice offered to:  Patient  DME Arranged:  3-N-1, Walker rolling DME Agency:  [place]:  PT, OT [facility] Agency:  [place]  Status of Service:  Completed, signed off  Medicare Important Message Given:    Date Medicare IM Given:    Medicare IM give by:    Date Additional Medicare IM Given:    Additional Medicare Important Message give by:     If discussed at [place] of Stay Meetings, dates discussed:    Additional Comments:  Rolm Baptise, RN 03/14/2015, 4:14 PM

## 2015-03-14 NOTE — Progress Notes (Signed)
Physical Therapy Treatment Patient Details Name: Marilyn Ford MRN: [id number] DOB: Aug 02, 1960 Today's Date: 03/14/2015    History of Present Illness 54 y.o. female with a history of hypertension, diabetes who presents 03/12/2015 with right-sided weakness. She states that she awoke with these symptoms, however she might have had some slurred speech even prior to going to bed. This morning, when she awoke she noticed that she was dragging her right foot and therefore sought further care in the emergency department. MRI + posterior left corona radiata to the external capsule and multiple small infarcts Lt occipital lobe    PT Comments    Patient able to make corrections as instructed, however remains limited by fatigue. Discussed f/u PT plans and pt strongly desires OPPT at [facility], [place] facility. She reports she will have transportation (and verbalizes that she may not drive herself). Husband present throughout and agrees.    Follow Up Recommendations  Outpatient PT     Equipment Recommendations  Rolling walker with 5" wheels    Recommendations for Other Services       Precautions / Restrictions Precautions Precautions: Fall Precaution Comments: up with assist, use walker for BR priviledges    Mobility  Bed Mobility Overal bed mobility: Modified Independent             General bed mobility comments: incr time and effort  Transfers Overall transfer level: Needs assistance Equipment used: Rolling walker (2 wheeled) Transfers: Sit to/from Stand Sit to Stand: Min guard         General transfer comment: cues for safest hand position  Ambulation/Gait Ambulation/Gait assistance: Min guard Ambulation Distance (Feet): 70 Feet Assistive device: Rolling walker (2 wheeled) Gait Pattern/deviations: Step-through pattern;Decreased stride length;Decreased dorsiflexion - right Gait velocity: decr   General Gait Details: vc for heelstrike on Rt (improved DF with  minimal supination); as fatigued noted Rt knee hyperextension which pt was able to control once made aware   Stairs Stairs:  (pt deferred 2/2 fatigue; able to verbalize correct pattern)          Wheelchair Mobility    Modified Rankin (Stroke Patients Only) Modified Rankin (Stroke Patients Only) Pre-Morbid Rankin Score: No symptoms Modified Rankin: Moderately severe disability     Balance           Standing balance support: No upper extremity supported Standing balance-Leahy Scale: Fair                      Cognition Arousal/Alertness: Awake/alert Behavior During Therapy: WFL for tasks assessed/performed Overall Cognitive Status: Within Functional Limits for tasks assessed                      Exercises      General Comments        Pertinent Vitals/Pain Pain Assessment: No/denies pain    Home Living                      Prior Function            PT Goals (current goals can now be found in the care plan section) Acute Rehab PT Goals 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

## 2015-03-14 NOTE — Evaluation (Addendum)
SLP Cancellation Note  Patient Details Name: Marilyn Ford MRN: [id number] DOB: 1961-02-18   Cancelled treatment:       Reason Eval/Treat Not Completed: Patient at procedure or test/unavailable  Will reattempt as schedule allows.  Pt undergoing echo.    Luanna Salk, [facility] SLP [phone]

## 2015-03-14 NOTE — Progress Notes (Signed)
Echocardiogram 2D Echocardiogram has been performed.  Marilyn Ford 03/14/2015, 11:12 AM

## 2015-03-14 NOTE — Discharge Instructions (Signed)
Follow with Primary MD STACKS,WARREN, MD in 7 days   Get CBC, CMP, 2 view Chest X ray checked  by Primary MD next visit.    Activity: As tolerated with Full fall precautions use walker/cane & assistance as needed   Disposition Home     Diet: Heart Healthy Low carb.  For Heart failure patients - Check your Weight same time everyday, if you gain over 2 pounds, or you develop in leg swelling, experience more shortness of breath or chest pain, call your Primary MD immediately. Follow Cardiac Low Salt Diet and 1.5 lit/day fluid restriction.   On your next visit with your primary care physician please Get Medicines reviewed and adjusted.   Please request your Prim.MD to go over all Hospital Tests and Procedure/Radiological results at the follow up, please get all Hospital records sent to your Prim MD by signing hospital release before you go home.   If you experience worsening of your admission symptoms, develop shortness of breath, life threatening emergency, suicidal or homicidal thoughts you must seek medical attention immediately by calling 911 or calling your MD immediately  if symptoms less severe.  You Must read complete instructions/literature along with all the possible adverse reactions/side effects for all the Medicines you take and that have been prescribed to you. Take any new Medicines after you have completely understood and accpet all the possible adverse reactions/side effects.   Do not drive, operating heavy machinery, perform activities at heights, swimming or participation in water activities or provide baby sitting services if your were admitted for syncope or siezures until you have seen by Primary MD or a Neurologist and advised to do so again.  Do not drive when taking Pain medications.    Do not take more than prescribed Pain, Sleep and Anxiety Medications  Special Instructions: If you have smoked or chewed Tobacco  in the last 2 yrs please stop smoking, stop any  regular Alcohol  and or any Recreational drug use.  Wear Seat belts while driving.   Please note  You were cared for by a hospitalist during your hospital stay. If you have any questions about your discharge medications or the care you received while you were in the hospital after you are discharged, you can call the unit and asked to speak with the hospitalist on call if the hospitalist that took care of you is not available. Once you are discharged, your primary care physician will handle any further medical issues. Please note that NO REFILLS for any discharge medications will be authorized once you are discharged, as it is imperative that you return to your primary care physician (or establish a relationship with a primary care physician if you do not have one) for your aftercare needs so that they can reassess your need for medications and monitor your lab values.

## 2015-03-14 NOTE — Progress Notes (Addendum)
Just after  Discharge I was informed of a BP 205/110 I called attending and he ordered medications will check BP again in about 30 min, medicated patient per attending BP came down 182/ 102 and instructed to take BP medication when she gets home.

## 2015-03-15 ENCOUNTER — Encounter: Payer: Self-pay | Admitting: Family Medicine

## 2015-03-15 ENCOUNTER — Ambulatory Visit (INDEPENDENT_AMBULATORY_CARE_PROVIDER_SITE_OTHER): Payer: PRIVATE HEALTH INSURANCE | Admitting: Family Medicine

## 2015-03-15 ENCOUNTER — Telehealth: Payer: Self-pay | Admitting: Family Medicine

## 2015-03-15 VITALS — BP 161/94 | HR 80 | Temp 97.8°F | Ht 62.0 in | Wt 150.0 lb

## 2015-03-15 DIAGNOSIS — E785 Hyperlipidemia, unspecified: Secondary | ICD-10-CM | POA: Insufficient documentation

## 2015-03-15 DIAGNOSIS — M6289 Other specified disorders of muscle: Secondary | ICD-10-CM | POA: Diagnosis not present

## 2015-03-15 DIAGNOSIS — R531 Weakness: Secondary | ICD-10-CM

## 2015-03-15 DIAGNOSIS — E1165 Type 2 diabetes mellitus with hyperglycemia: Secondary | ICD-10-CM | POA: Diagnosis not present

## 2015-03-15 DIAGNOSIS — I10 Essential (primary) hypertension: Secondary | ICD-10-CM | POA: Diagnosis not present

## 2015-03-15 DIAGNOSIS — IMO0002 Reserved for concepts with insufficient information to code with codable children: Secondary | ICD-10-CM

## 2015-03-15 DIAGNOSIS — I63332 Cerebral infarction due to thrombosis of left posterior cerebral artery: Secondary | ICD-10-CM | POA: Diagnosis not present

## 2015-03-15 DIAGNOSIS — E1159 Type 2 diabetes mellitus with other circulatory complications: Secondary | ICD-10-CM

## 2015-03-15 DIAGNOSIS — Z720 Tobacco use: Secondary | ICD-10-CM

## 2015-03-15 LAB — GLUCOSE, CAPILLARY: GLUCOSE-CAPILLARY: 108 mg/dL — AB (ref 65–99)

## 2015-03-15 MED ORDER — VARENICLINE TARTRATE 0.5 MG X 11 & 1 MG X 42 PO MISC: ORAL | Status: DC

## 2015-03-15 MED ORDER — VALSARTAN-HYDROCHLOROTHIAZIDE 160-25 MG PO TABS: 1.0000 | ORAL_TABLET | Freq: Every day | ORAL | Status: DC

## 2015-03-15 NOTE — Telephone Encounter (Signed)
Patient's husband aware °

## 2015-03-15 NOTE — Progress Notes (Signed)
Subjective:  Patient ID: Marilyn Ford, female    DOB: 10-27-1960  Age: 54 y.o. MRN: [id number]  CC: Hospitalization Follow-up   HPI Marilyn Ford presents for released from hospital yesterday. She has right-sided weakness after a CVA. She was started on Plavix and aspirin. Hospital report reviewed. At discharge summary risk factor management was recommended. Currently the patient is a smoker she has elevated cholesterol but was started on Lipitor however she decided not to take it when it was prescribed. She was also started on niacin and nicotine patch while in the hospital. She is not using that she and is not to fully committed to smoking cessation. Currently she has weakness of the right leg but has regained some function of the right upper extremity.  History Marilyn Ford has a past medical history of Hypertension and Diabetes mellitus without complication ([place]).   She has past surgical history that includes Cholecystectomy and Cesarean section.   Her family history includes Diabetes in her brother and mother.She reports that she has been smoking Cigarettes.  She started smoking about 15 years ago. She has been smoking about 0.50 packs per day. She does not have any smokeless tobacco history on file. She reports that she does not drink alcohol or use illicit drugs.  Outpatient Prescriptions Prior to Visit  Medication Sig Dispense Refill  . aspirin EC 81 MG EC tablet Take 1 tablet (81 mg total) by mouth daily. 30 tablet 0  . atorvastatin (LIPITOR) 80 MG tablet Take 1 tablet (80 mg total) by mouth daily at 6 PM. 30 tablet 0  . clopidogrel (PLAVIX) 75 MG tablet Take 1 tablet (75 mg total) by mouth daily. 30 tablet 3  . metFORMIN (GLUCOPHAGE-XR) 500 MG 24 hr tablet Take 1 tablet (500 mg total) by mouth daily with breakfast. 30 tablet 2  . Multiple Vitamin (MULTIVITAMIN) capsule Take 1 capsule by mouth daily.    Marilyn Ford Kitchen ACCU-CHEK SOFTCLIX LANCETS lancets Use to check BG once daily 100 each 2  .  glucose blood (ACCU-CHEK AVIVA PLUS) test strip Use to check BG up to QD 100 each 2  . ibuprofen (ADVIL,MOTRIN) 200 MG tablet Take 200 mg by mouth every 8 (eight) hours as needed (pain).    . niacin 50 MG tablet Take 1 tablet (50 mg total) by mouth at bedtime. 30 tablet 0  . nicotine (NICODERM CQ - DOSED IN MG/24 HOURS) 14 mg/24hr patch Place 1 patch (14 mg total) onto the skin daily. 28 patch 0  . amLODipine (NORVASC) 5 MG tablet Take 1 tablet (5 mg total) by mouth daily. For blood pressure (Patient not taking: Reported on 03/15/2015) 30 tablet 2   No facility-administered medications prior to visit.    ROS Review of Systems  Constitutional: Negative for fever, activity change and appetite change.  HENT: Negative for congestion, rhinorrhea and sore throat.   Eyes: Negative for pain and visual disturbance.  Respiratory: Negative for cough and shortness of breath.   Gastrointestinal: Negative for nausea and abdominal pain.  Musculoskeletal: Negative for myalgias and arthralgias.  Neurological: Positive for facial asymmetry and weakness. Negative for dizziness and speech difficulty.  Hematological: Negative for adenopathy. Does not bruise/bleed easily.    Objective:  BP 161/94 mmHg  Pulse 80  Temp(Src) 97.8 F (36.6 C) (Oral)  Ht 5\' 2"  (1.575 m)  Wt 150 lb (68.04 kg)  BMI 27.43 kg/m2  SpO2 98%  BP Readings from Last 3 Encounters:  03/15/15 161/94  03/14/15 182/102  12/23/14 180/99    Wt Readings from Last 3 Encounters:  03/15/15 150 lb (68.04 kg)  03/14/15 153 lb 7 oz (69.6 kg)  12/23/14 150 lb 8 oz (68.266 kg)     Physical Exam  Constitutional: She is oriented to person, place, and time. She appears well-developed and well-nourished. No distress.  HENT:  Head: Normocephalic and atraumatic.  Right Ear: External ear normal.  Left Ear: External ear normal.  Nose: Nose normal.  Mouth/Throat: Oropharynx is clear and moist.  Eyes: Conjunctivae and EOM are normal. Pupils  are equal, round, and reactive to light.  Neck: Normal range of motion. Neck supple. No thyromegaly present.  Cardiovascular: Normal rate, regular rhythm and normal heart sounds.   No murmur heard. Pulmonary/Chest: Effort normal and breath sounds normal. No respiratory distress. She has no wheezes. She has no rales.  Abdominal: Soft. Bowel sounds are normal. She exhibits no distension. There is no tenderness.  Musculoskeletal:  Right lower extremity strength is 2-3/5. Patient is wheelchair-bound. There is minimal weakness of the right upper extremity.  Lymphadenopathy:    She has no cervical adenopathy.  Neurological: She is alert and oriented to person, place, and time. She has normal reflexes.  Skin: Skin is warm and dry.  Psychiatric: She has a normal mood and affect. Her behavior is normal. Judgment and thought content normal.    Lab Results  Component Value Date   HGBA1C 6.8* 03/13/2015   HGBA1C 6.8 12/14/2014    Lab Results  Component Value Date   WBC 8.5 03/13/2015   HGB 15.7* 03/13/2015   HCT 43.9 03/13/2015   PLT 264 03/13/2015   GLUCOSE 177* 03/12/2015   CHOL 302* 03/13/2015   TRIG 534* 03/13/2015   HDL 39* 03/13/2015   LDLCALC UNABLE TO CALCULATE IF TRIGLYCERIDE OVER 400 mg/dL 03/13/2015   ALT 24 03/12/2015   AST 26 03/12/2015   NA 138 03/12/2015   K 3.8 03/12/2015   CL 97* 03/12/2015   CREATININE 0.85 03/12/2015   BUN 20 03/12/2015   CO2 23 03/12/2015   HGBA1C 6.8* 03/13/2015    Dg Chest 2 View  03/12/2015  CLINICAL DATA:  54 year old female with history of altered mental status and headache for 1 week. Blurred vision and weakness today. EXAM: CHEST  2 VIEW COMPARISON:  No priors. FINDINGS: Lung volumes are normal. No consolidative airspace disease. No pleural effusions. No pneumothorax. No pulmonary nodule or mass noted. Pulmonary vasculature and the cardiomediastinal silhouette are within normal limits. Atherosclerosis in the thoracic aorta. IMPRESSION: 1.   No radiographic evidence of acute cardiopulmonary disease. 2. Atherosclerosis. Electronically Signed   By: Vinnie Langton M.D.   On: 03/12/2015 10:05   Ct Head Wo Contrast  03/12/2015  CLINICAL DATA:  Right-sided weakness and numbness since yesterday. [place] out of bed this morning, now with confusion. EXAM: CT HEAD WITHOUT CONTRAST TECHNIQUE: Contiguous axial images were obtained from the base of the skull through the vertex without intravenous contrast. COMPARISON:  None. FINDINGS: Regional soft tissues appear normal. No radiopaque foreign body. No displaced calvarial fracture. Bilateral basal ganglial lacunar infarcts, right greater than left (representative (image 18, series 2). Bilateral basal ganglial calcifications. Scattered periventricular hypodensities compatible with microvascular ischemic disease. The gray-white differentiation is otherwise well maintained without CT evidence of superimposed acute large territory infarct. No intraparenchymal or extra-axial mass or hemorrhage. Unchanged size and configuration of the ventricles and basilar cisterns. No midline shift. There is diffuse increased attenuation intracranial blood pool suggestive of volume  depletion. Limited visualization the paranasal sinuses and mastoid air cells is normal. No air-fluid levels. IMPRESSION: Advanced microvascular ischemic disease without definite superimposed acute intracranial process. Electronically Signed   By: Sandi Mariscal M.D.   On: 03/12/2015 10:30   Mr Brain Wo Contrast  03/12/2015  CLINICAL DATA:  54 year old female with hypertension, hyperglycemia. One week of headaches, progressed at 0300 hours with associated right extremity weakness and tingling at that time. Initial encounter. EXAM: MRI HEAD WITHOUT CONTRAST MRA HEAD WITHOUT CONTRAST TECHNIQUE: Multiplanar, multiecho pulse sequences of the brain and surrounding structures were obtained without intravenous contrast. Angiographic images of the head were obtained  using MRA technique without contrast. COMPARISON:  Head CT without contrast 1010 hours today. FINDINGS: MRI HEAD FINDINGS 16 mm curvilinear area of restricted diffusion tracking from the posterior left corona radiata through the external capsule toward the lentiform nuclei. Mild associated T2 and FLAIR hyperintensity. No associated acute hemorrhage or mass effect. Superimposed chronic lacunar infarcts, some with hemosiderin, in the right basal ganglia, bilateral thalami, and anterior limb of the left external capsule. Chronic lacunar infarct in the left pons. There are also superimposed small scattered white matter foci of restricted diffusion in the left occipital lobe best seen on series 4, image 17. Minimal associated T2 and FLAIR hyperintensity with no hemorrhage or mass effect. No right hemisphere or posterior fossa restricted diffusion. Major intracranial vascular flow voids are preserved. No cortical encephalomalacia. No other chronic cerebral blood products. No midline shift, mass effect, evidence of mass lesion, ventriculomegaly, extra-axial collection or acute intracranial hemorrhage. Cervicomedullary junction and pituitary are within normal limits. Negative visualized cervical spine. Visible internal auditory structures appear normal. Mastoids and paranasal sinuses are clear. Negative orbit and scalp soft tissues. Visualized bone marrow signal is within normal limits. MRA HEAD FINDINGS Antegrade flow in the posterior circulation with dominant distal left vertebral artery. Both PICA origins are patent. Patent vertebrobasilar junction. No basilar artery stenosis. SCA and PCA origins are normal. Right PCA branches are within normal limits. Posterior communicating arteries are diminutive or absent. There is a moderate to severe focal stenosis in the left PCA P2 segment (Series 6, image 79). Preserved distal left PCA flow. Antegrade flow in both ICA siphons. No siphon stenosis. Ophthalmic artery origins are  normal. Patent carotid termini. Dominant left ACA. ACA origins are normal. Diminutive anterior communicating artery. Visualized ACA branches are within normal limits. Right MCA M1 segment is mildly irregular without stenosis. Visualized MCA branches are within normal limits. Left MCA origin and M1 segment are patent. Mild M1 segment irregularity without stenosis. Patent left MCA bifurcation. No left MCA branch occlusion identified. IMPRESSION: 1. Acute lacunar infarct extending from the posterior left corona radiata to the external capsule. No hemorrhage or mass effect. 2. Superimposed scattered small acute left PCA territory white matter infarcts in the occipital lobe no hemorrhage or mass effect. Favor synchronous atherosclerotic/small vessel disease rather than sequelae of emboli. 3. Intracranial MRA remarkable for moderate to severe left PCA P2 segment stenosis. No anterior circulation stenosis or left MCA branch occlusion identified. 4. Underlying advanced chronic small vessel ischemia in the bilateral deep gray matter nuclei and brainstem. Electronically Signed   By: Genevie Ann M.D.   On: 03/12/2015 14:14   Mr Jodene Nam Head/brain Wo Cm  03/12/2015  CLINICAL DATA:  54 year old female with hypertension, hyperglycemia. One week of headaches, progressed at 0300 hours with associated right extremity weakness and tingling at that time. Initial encounter. EXAM: MRI HEAD WITHOUT CONTRAST MRA  HEAD WITHOUT CONTRAST TECHNIQUE: Multiplanar, multiecho pulse sequences of the brain and surrounding structures were obtained without intravenous contrast. Angiographic images of the head were obtained using MRA technique without contrast. COMPARISON:  Head CT without contrast 1010 hours today. FINDINGS: MRI HEAD FINDINGS 16 mm curvilinear area of restricted diffusion tracking from the posterior left corona radiata through the external capsule toward the lentiform nuclei. Mild associated T2 and FLAIR hyperintensity. No associated acute  hemorrhage or mass effect. Superimposed chronic lacunar infarcts, some with hemosiderin, in the right basal ganglia, bilateral thalami, and anterior limb of the left external capsule. Chronic lacunar infarct in the left pons. There are also superimposed small scattered white matter foci of restricted diffusion in the left occipital lobe best seen on series 4, image 17. Minimal associated T2 and FLAIR hyperintensity with no hemorrhage or mass effect. No right hemisphere or posterior fossa restricted diffusion. Major intracranial vascular flow voids are preserved. No cortical encephalomalacia. No other chronic cerebral blood products. No midline shift, mass effect, evidence of mass lesion, ventriculomegaly, extra-axial collection or acute intracranial hemorrhage. Cervicomedullary junction and pituitary are within normal limits. Negative visualized cervical spine. Visible internal auditory structures appear normal. Mastoids and paranasal sinuses are clear. Negative orbit and scalp soft tissues. Visualized bone marrow signal is within normal limits. MRA HEAD FINDINGS Antegrade flow in the posterior circulation with dominant distal left vertebral artery. Both PICA origins are patent. Patent vertebrobasilar junction. No basilar artery stenosis. SCA and PCA origins are normal. Right PCA branches are within normal limits. Posterior communicating arteries are diminutive or absent. There is a moderate to severe focal stenosis in the left PCA P2 segment (Series 6, image 79). Preserved distal left PCA flow. Antegrade flow in both ICA siphons. No siphon stenosis. Ophthalmic artery origins are normal. Patent carotid termini. Dominant left ACA. ACA origins are normal. Diminutive anterior communicating artery. Visualized ACA branches are within normal limits. Right MCA M1 segment is mildly irregular without stenosis. Visualized MCA branches are within normal limits. Left MCA origin and M1 segment are patent. Mild M1 segment  irregularity without stenosis. Patent left MCA bifurcation. No left MCA branch occlusion identified. IMPRESSION: 1. Acute lacunar infarct extending from the posterior left corona radiata to the external capsule. No hemorrhage or mass effect. 2. Superimposed scattered small acute left PCA territory white matter infarcts in the occipital lobe no hemorrhage or mass effect. Favor synchronous atherosclerotic/small vessel disease rather than sequelae of emboli. 3. Intracranial MRA remarkable for moderate to severe left PCA P2 segment stenosis. No anterior circulation stenosis or left MCA branch occlusion identified. 4. Underlying advanced chronic small vessel ischemia in the bilateral deep gray matter nuclei and brainstem. Electronically Signed   By: Genevie Ann M.D.   On: 03/12/2015 14:14    Assessment & Plan:   There are no diagnoses linked to this encounter. I have discontinued Ms. Recker's glucose blood, ACCU-CHEK SOFTCLIX LANCETS, ibuprofen, niacin, and nicotine. I am also having her start on varenicline. Additionally, I am having her maintain her amLODipine, metFORMIN, multivitamin, atorvastatin, aspirin, and clopidogrel.  Meds ordered this encounter  Medications  . DISCONTD: valsartan-hydrochlorothiazide (DIOVAN-HCT) 160-25 MG tablet    Sig: Take 1 tablet by mouth daily.  . varenicline (CHANTIX STARTING MONTH PAK) 0.5 MG X 11 & 1 MG X 42 tablet    Sig: Take one 0.5 mg tablet by mouth once daily for 3 days, then increase to one 0.5 mg tablet twice daily for 4 days, then increase to one 1  mg tablet twice daily.    Dispense:  53 tablet    Refill:  0   Discussed in detail the need to stop smoking. Additionally keeping tight control over her diabetes. The repeat blood pressure down and controlling cholesterol are important goals for her to follow  Follow-up: No Follow-up on file.  Claretta Fraise, M.D.

## 2015-03-16 ENCOUNTER — Telehealth: Payer: Self-pay | Admitting: Family Medicine

## 2015-03-16 ENCOUNTER — Other Ambulatory Visit: Payer: Self-pay | Admitting: Family Medicine

## 2015-03-16 MED ORDER — TRAZODONE HCL 150 MG PO TABS: ORAL_TABLET | ORAL | Status: DC

## 2015-03-16 NOTE — Telephone Encounter (Signed)
Med sent to [facility]  

## 2015-03-16 NOTE — Telephone Encounter (Signed)
Patient aware and rx called into pharmacy.  

## 2015-03-17 NOTE — Telephone Encounter (Signed)
We will fill out for her, she will bring 03/18/15

## 2015-03-18 ENCOUNTER — Other Ambulatory Visit: Payer: Self-pay

## 2015-03-18 NOTE — Patient Outreach (Signed)
Successful outreach to confirm consent to receiving [facility] Transition calls.  Calls will begin Sat 03/19/15; [place] [facility] will follow-up based on daily dashboard report results. Kenney Houseman, [facility] [phone]

## 2015-03-22 ENCOUNTER — Ambulatory Visit (INDEPENDENT_AMBULATORY_CARE_PROVIDER_SITE_OTHER): Payer: PRIVATE HEALTH INSURANCE | Admitting: Family Medicine

## 2015-03-22 ENCOUNTER — Encounter: Payer: Self-pay | Admitting: Family Medicine

## 2015-03-22 VITALS — BP 151/84 | HR 100 | Temp 97.1°F | Ht 62.0 in | Wt 157.0 lb

## 2015-03-22 DIAGNOSIS — I69359 Hemiplegia and hemiparesis following cerebral infarction affecting unspecified side: Secondary | ICD-10-CM | POA: Diagnosis not present

## 2015-03-22 DIAGNOSIS — Z23 Encounter for immunization: Secondary | ICD-10-CM

## 2015-03-22 MED ORDER — VALSARTAN-HYDROCHLOROTHIAZIDE 320-25 MG PO TABS: 1.0000 | ORAL_TABLET | Freq: Every day | ORAL | Status: DC

## 2015-03-22 MED ORDER — AMLODIPINE BESYLATE 10 MG PO TABS: 10.0000 mg | ORAL_TABLET | Freq: Every day | ORAL | Status: DC

## 2015-03-22 NOTE — Progress Notes (Signed)
Subjective:  Patient ID: Marilyn Ford, female    DOB: 1961-04-14  Age: 54 y.o. MRN: [id number]  CC: Cerebrovascular Accident   HPI [name] presents for follow-up from her recent CVA. She has been taking therapy at home and has been getting some strength back this week. She continues to have weakness on the left side. She is walking a little bit but does not have complete use of her right upper extremity and lower extremity is very slow and deliberate. She has committed to taking her medications for cholesterol and pressure and diabetes. However she is afraid of taking the Chantix. She is concerned about the possibility of side effects including depression.  Multiple blood pressure readings from home brought in showing systolics in the [id number] range and the diastolics in the 123456 10 range. Sugars taken fasting and postprandial. In the high 100s on the same log sheet.  History [name] has a past medical history of Hypertension and Diabetes mellitus without complication ([place]).   She has past surgical history that includes Cholecystectomy and Cesarean section.   Her family history includes Diabetes in her brother and mother.She reports that she has been smoking Cigarettes.  She started smoking about 15 years ago. She has been smoking about 0.50 packs per day. She does not have any smokeless tobacco history on file. She reports that she does not drink alcohol or use illicit drugs.  Outpatient Prescriptions Prior to Visit  Medication Sig Dispense Refill  . aspirin EC 81 MG EC tablet Take 1 tablet (81 mg total) by mouth daily. 30 tablet 0  . atorvastatin (LIPITOR) 80 MG tablet Take 1 tablet (80 mg total) by mouth daily at 6 PM. 30 tablet 0  . clopidogrel (PLAVIX) 75 MG tablet Take 1 tablet (75 mg total) by mouth daily. 30 tablet 3  . metFORMIN (GLUCOPHAGE-XR) 500 MG 24 hr tablet Take 1 tablet (500 mg total) by mouth daily with breakfast. 30 tablet 2  . Multiple Vitamin (MULTIVITAMIN)  capsule Take 1 capsule by mouth daily.    . traZODone (DESYREL) 150 MG tablet 1 or 2 at bedtime for sleep 60 tablet 2  . amLODipine (NORVASC) 5 MG tablet Take 1 tablet (5 mg total) by mouth daily. For blood pressure 30 tablet 2  . valsartan-hydrochlorothiazide (DIOVAN-HCT) 160-25 MG tablet Take 1 tablet by mouth daily. 30 tablet 2  . varenicline (CHANTIX STARTING MONTH PAK) 0.5 MG X 11 & 1 MG X 42 tablet Take one 0.5 mg tablet by mouth once daily for 3 days, then increase to one 0.5 mg tablet twice daily for 4 days, then increase to one 1 mg tablet twice daily. (Patient not taking: Reported on 03/22/2015) 53 tablet 0   No facility-administered medications prior to visit.    ROS Review of Systems  Constitutional: Negative for fever, chills, diaphoresis, appetite change, fatigue and unexpected weight change.  HENT: Negative for congestion, ear pain, hearing loss, postnasal drip, rhinorrhea, sneezing, sore throat and trouble swallowing.   Eyes: Negative for pain.  Respiratory: Negative for cough, chest tightness and shortness of breath.   Cardiovascular: Negative for chest pain and palpitations.  Gastrointestinal: Negative for nausea, vomiting, abdominal pain, diarrhea and constipation.  Genitourinary: Negative for dysuria, frequency and menstrual problem.  Musculoskeletal: Negative for joint swelling and arthralgias.  Skin: Negative for rash.  Neurological: Negative for dizziness, weakness, numbness and headaches.  Psychiatric/Behavioral: Negative for dysphoric mood and agitation.    Objective:  BP 151/84 mmHg  Pulse  100  Temp(Src) 97.1 F (36.2 C) (Oral)  Ht 5\' 2"  (1.575 m)  Wt 157 lb (71.215 kg)  BMI 28.71 kg/m2  SpO2 100%  BP Readings from Last 3 Encounters:  03/22/15 151/84  03/15/15 161/94  03/14/15 182/102    Wt Readings from Last 3 Encounters:  03/22/15 157 lb (71.215 kg)  03/15/15 150 lb (68.04 kg)  03/14/15 153 lb 7 oz (69.6 kg)     Physical Exam    Constitutional: She is oriented to person, place, and time. She appears well-developed and well-nourished. No distress.  HENT:  Head: Normocephalic and atraumatic.  Right Ear: External ear normal.  Left Ear: External ear normal.  Nose: Nose normal.  Mouth/Throat: Oropharynx is clear and moist.  Eyes: Conjunctivae and EOM are normal. Pupils are equal, round, and reactive to light.  Neck: Normal range of motion. Neck supple. No thyromegaly present.  Cardiovascular: Normal rate, regular rhythm and normal heart sounds.   No murmur heard. Pulmonary/Chest: Effort normal and breath sounds normal. No respiratory distress. She has no wheezes. She has no rales.  Abdominal: Soft. Bowel sounds are normal. She exhibits no distension. There is no tenderness.  Lymphadenopathy:    She has no cervical adenopathy.  Neurological: She is alert and oriented to person, place, and time. She has normal reflexes.  Skin: Skin is warm and dry.  Psychiatric: She has a normal mood and affect. Her behavior is normal. Judgment and thought content normal.    Lab Results  Component Value Date   HGBA1C 6.8* 03/13/2015   HGBA1C 6.8 12/14/2014    Lab Results  Component Value Date   WBC 8.5 03/13/2015   HGB 15.7* 03/13/2015   HCT 43.9 03/13/2015   PLT 264 03/13/2015   GLUCOSE 177* 03/12/2015   CHOL 302* 03/13/2015   TRIG 534* 03/13/2015   HDL 39* 03/13/2015   LDLCALC UNABLE TO CALCULATE IF TRIGLYCERIDE OVER 400 mg/dL 03/13/2015   ALT 24 03/12/2015   AST 26 03/12/2015   NA 138 03/12/2015   K 3.8 03/12/2015   CL 97* 03/12/2015   CREATININE 0.85 03/12/2015   BUN 20 03/12/2015   CO2 23 03/12/2015   HGBA1C 6.8* 03/13/2015    Dg Chest 2 View  03/12/2015  CLINICAL DATA:  54 year old female with history of altered mental status and headache for 1 week. Blurred vision and weakness today. EXAM: CHEST  2 VIEW COMPARISON:  No priors. FINDINGS: Lung volumes are normal. No consolidative airspace disease. No pleural  effusions. No pneumothorax. No pulmonary nodule or mass noted. Pulmonary vasculature and the cardiomediastinal silhouette are within normal limits. Atherosclerosis in the thoracic aorta. IMPRESSION: 1.  No radiographic evidence of acute cardiopulmonary disease. 2. Atherosclerosis. Electronically Signed   By: Vinnie Langton M.D.   On: 03/12/2015 10:05   Ct Head Wo Contrast  03/12/2015  CLINICAL DATA:  Right-sided weakness and numbness since yesterday. [place] out of bed this morning, now with confusion. EXAM: CT HEAD WITHOUT CONTRAST TECHNIQUE: Contiguous axial images were obtained from the base of the skull through the vertex without intravenous contrast. COMPARISON:  None. FINDINGS: Regional soft tissues appear normal. No radiopaque foreign body. No displaced calvarial fracture. Bilateral basal ganglial lacunar infarcts, right greater than left (representative (image 18, series 2). Bilateral basal ganglial calcifications. Scattered periventricular hypodensities compatible with microvascular ischemic disease. The gray-white differentiation is otherwise well maintained without CT evidence of superimposed acute large territory infarct. No intraparenchymal or extra-axial mass or hemorrhage. Unchanged size and configuration of  the ventricles and basilar cisterns. No midline shift. There is diffuse increased attenuation intracranial blood pool suggestive of volume depletion. Limited visualization the paranasal sinuses and mastoid air cells is normal. No air-fluid levels. IMPRESSION: Advanced microvascular ischemic disease without definite superimposed acute intracranial process. Electronically Signed   By: Sandi Mariscal M.D.   On: 03/12/2015 10:30   Mr Brain Wo Contrast  03/12/2015  CLINICAL DATA:  54 year old female with hypertension, hyperglycemia. One week of headaches, progressed at 0300 hours with associated right extremity weakness and tingling at that time. Initial encounter. EXAM: MRI HEAD WITHOUT CONTRAST MRA  HEAD WITHOUT CONTRAST TECHNIQUE: Multiplanar, multiecho pulse sequences of the brain and surrounding structures were obtained without intravenous contrast. Angiographic images of the head were obtained using MRA technique without contrast. COMPARISON:  Head CT without contrast 1010 hours today. FINDINGS: MRI HEAD FINDINGS 16 mm curvilinear area of restricted diffusion tracking from the posterior left corona radiata through the external capsule toward the lentiform nuclei. Mild associated T2 and FLAIR hyperintensity. No associated acute hemorrhage or mass effect. Superimposed chronic lacunar infarcts, some with hemosiderin, in the right basal ganglia, bilateral thalami, and anterior limb of the left external capsule. Chronic lacunar infarct in the left pons. There are also superimposed small scattered white matter foci of restricted diffusion in the left occipital lobe best seen on series 4, image 17. Minimal associated T2 and FLAIR hyperintensity with no hemorrhage or mass effect. No right hemisphere or posterior fossa restricted diffusion. Major intracranial vascular flow voids are preserved. No cortical encephalomalacia. No other chronic cerebral blood products. No midline shift, mass effect, evidence of mass lesion, ventriculomegaly, extra-axial collection or acute intracranial hemorrhage. Cervicomedullary junction and pituitary are within normal limits. Negative visualized cervical spine. Visible internal auditory structures appear normal. Mastoids and paranasal sinuses are clear. Negative orbit and scalp soft tissues. Visualized bone marrow signal is within normal limits. MRA HEAD FINDINGS Antegrade flow in the posterior circulation with dominant distal left vertebral artery. Both PICA origins are patent. Patent vertebrobasilar junction. No basilar artery stenosis. SCA and PCA origins are normal. Right PCA branches are within normal limits. Posterior communicating arteries are diminutive or absent. There is a  moderate to severe focal stenosis in the left PCA P2 segment (Series 6, image 79). Preserved distal left PCA flow. Antegrade flow in both ICA siphons. No siphon stenosis. Ophthalmic artery origins are normal. Patent carotid termini. Dominant left ACA. ACA origins are normal. Diminutive anterior communicating artery. Visualized ACA branches are within normal limits. Right MCA M1 segment is mildly irregular without stenosis. Visualized MCA branches are within normal limits. Left MCA origin and M1 segment are patent. Mild M1 segment irregularity without stenosis. Patent left MCA bifurcation. No left MCA branch occlusion identified. IMPRESSION: 1. Acute lacunar infarct extending from the posterior left corona radiata to the external capsule. No hemorrhage or mass effect. 2. Superimposed scattered small acute left PCA territory white matter infarcts in the occipital lobe no hemorrhage or mass effect. Favor synchronous atherosclerotic/small vessel disease rather than sequelae of emboli. 3. Intracranial MRA remarkable for moderate to severe left PCA P2 segment stenosis. No anterior circulation stenosis or left MCA branch occlusion identified. 4. Underlying advanced chronic small vessel ischemia in the bilateral deep gray matter nuclei and brainstem. Electronically Signed   By: Genevie Ann M.D.   On: 03/12/2015 14:14   Mr Jodene Nam Head/brain Wo Cm  03/12/2015  CLINICAL DATA:  54 year old female with hypertension, hyperglycemia. One week of headaches, progressed at 0300  hours with associated right extremity weakness and tingling at that time. Initial encounter. EXAM: MRI HEAD WITHOUT CONTRAST MRA HEAD WITHOUT CONTRAST TECHNIQUE: Multiplanar, multiecho pulse sequences of the brain and surrounding structures were obtained without intravenous contrast. Angiographic images of the head were obtained using MRA technique without contrast. COMPARISON:  Head CT without contrast 1010 hours today. FINDINGS: MRI HEAD FINDINGS 16 mm  curvilinear area of restricted diffusion tracking from the posterior left corona radiata through the external capsule toward the lentiform nuclei. Mild associated T2 and FLAIR hyperintensity. No associated acute hemorrhage or mass effect. Superimposed chronic lacunar infarcts, some with hemosiderin, in the right basal ganglia, bilateral thalami, and anterior limb of the left external capsule. Chronic lacunar infarct in the left pons. There are also superimposed small scattered white matter foci of restricted diffusion in the left occipital lobe best seen on series 4, image 17. Minimal associated T2 and FLAIR hyperintensity with no hemorrhage or mass effect. No right hemisphere or posterior fossa restricted diffusion. Major intracranial vascular flow voids are preserved. No cortical encephalomalacia. No other chronic cerebral blood products. No midline shift, mass effect, evidence of mass lesion, ventriculomegaly, extra-axial collection or acute intracranial hemorrhage. Cervicomedullary junction and pituitary are within normal limits. Negative visualized cervical spine. Visible internal auditory structures appear normal. Mastoids and paranasal sinuses are clear. Negative orbit and scalp soft tissues. Visualized bone marrow signal is within normal limits. MRA HEAD FINDINGS Antegrade flow in the posterior circulation with dominant distal left vertebral artery. Both PICA origins are patent. Patent vertebrobasilar junction. No basilar artery stenosis. SCA and PCA origins are normal. Right PCA branches are within normal limits. Posterior communicating arteries are diminutive or absent. There is a moderate to severe focal stenosis in the left PCA P2 segment (Series 6, image 79). Preserved distal left PCA flow. Antegrade flow in both ICA siphons. No siphon stenosis. Ophthalmic artery origins are normal. Patent carotid termini. Dominant left ACA. ACA origins are normal. Diminutive anterior communicating artery. Visualized  ACA branches are within normal limits. Right MCA M1 segment is mildly irregular without stenosis. Visualized MCA branches are within normal limits. Left MCA origin and M1 segment are patent. Mild M1 segment irregularity without stenosis. Patent left MCA bifurcation. No left MCA branch occlusion identified. IMPRESSION: 1. Acute lacunar infarct extending from the posterior left corona radiata to the external capsule. No hemorrhage or mass effect. 2. Superimposed scattered small acute left PCA territory white matter infarcts in the occipital lobe no hemorrhage or mass effect. Favor synchronous atherosclerotic/small vessel disease rather than sequelae of emboli. 3. Intracranial MRA remarkable for moderate to severe left PCA P2 segment stenosis. No anterior circulation stenosis or left MCA branch occlusion identified. 4. Underlying advanced chronic small vessel ischemia in the bilateral deep gray matter nuclei and brainstem. Electronically Signed   By: Genevie Ann M.D.   On: 03/12/2015 14:14    Assessment & Plan:   Lulabell was seen today for cerebrovascular accident.  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 For blood pressure -     valsartan-hydrochlorothiazide (DIOVAN-HCT) 320-25 MG tablet; Take 1 tablet by mouth daily. -     Flu Vaccine QUAD 36+ mos IM   I have discontinued Ms. Harville's valsartan-hydrochlorothiazide. I have also changed her amLODipine. Additionally, I  am having her start on valsartan-hydrochlorothiazide. Lastly, I am having her maintain her metFORMIN, multivitamin, atorvastatin, aspirin, clopidogrel, varenicline, and traZODone.  Meds ordered this encounter  Medications  . amLODipine (NORVASC) 10 MG tablet    Sig: Take 1 tablet (10 mg total) by mouth daily. For blood  pressure    Dispense:  30 tablet    Refill:  2  . valsartan-hydrochlorothiazide (DIOVAN-HCT) 320-25 MG tablet    Sig: Take 1 tablet by mouth daily.    Dispense:  30 tablet    Refill:  2   Patient was encouraged to start taking the Chantix starter pack prescribed at the previous visit. We reviewed in detail the side effects of the use of Chantix and perspective remedies/treatments and interventions. Patient seems to be more comfortable with the drug after that discussion. Additionally we discussed the risk benefit particularly with continued smoking. She will make every effort to stop smoking she states at this time.  Follow-up: Return in about 1 month (around 04/21/2015) for hypertension, diabetes, smoking cessation.  Claretta Fraise, M.D.

## 2015-03-24 ENCOUNTER — Telehealth: Payer: Self-pay

## 2015-03-24 ENCOUNTER — Telehealth: Payer: Self-pay | Admitting: Family Medicine

## 2015-03-24 DIAGNOSIS — I63332 Cerebral infarction due to thrombosis of left posterior cerebral artery: Secondary | ICD-10-CM

## 2015-03-24 NOTE — Telephone Encounter (Signed)
Okay to refer? 

## 2015-03-24 NOTE — Telephone Encounter (Signed)
On Stacks desk, just waiting for signature, pt aware

## 2015-03-24 NOTE — Telephone Encounter (Signed)
Patieints husband aware that referral has been placed.

## 2015-03-24 NOTE — Telephone Encounter (Signed)
Insurance denied Chantix.  Plan does not cover this type of medicine

## 2015-03-25 NOTE — Telephone Encounter (Signed)
Picked up papers today

## 2015-03-25 NOTE — Telephone Encounter (Signed)
Please find her a couopon. Also remind her of the offset in cost of cigarettes.Thanks, WS

## 2015-03-29 ENCOUNTER — Ambulatory Visit: Payer: PRIVATE HEALTH INSURANCE | Attending: Family Medicine | Admitting: Physical Therapy

## 2015-03-29 VITALS — BP 139/88

## 2015-03-29 DIAGNOSIS — R2681 Unsteadiness on feet: Secondary | ICD-10-CM | POA: Diagnosis present

## 2015-03-29 DIAGNOSIS — R29898 Other symptoms and signs involving the musculoskeletal system: Secondary | ICD-10-CM

## 2015-03-29 DIAGNOSIS — M259 Joint disorder, unspecified: Secondary | ICD-10-CM | POA: Insufficient documentation

## 2015-03-29 NOTE — Therapy (Signed)
[facility] [place], [place], [address] Phone: [phone]   Fax:  [phone]  Physical Therapy Evaluation  Patient Details  Name: Marilyn Ford MRN: [id number] Date of Birth: June 16, 1960 Referring Provider: Claretta Fraise MD  Encounter Date: 03/29/2015      PT End of Session - 03/29/15 1220    Visit Number 1   Number of Visits 16   Date for PT Re-Evaluation 05/31/15   PT Start Time 1115   PT Stop Time 1159   PT Time Calculation (min) 44 min   Activity Tolerance Patient tolerated treatment well   Behavior During Therapy [facility] for tasks assessed/performed      Past Medical History  Diagnosis Date  . Hypertension   . Diabetes mellitus without complication [facility])     Past Surgical History  Procedure Laterality Date  . Cholecystectomy    . Cesarean section      Filed Vitals:   03/29/15 1221  BP: 139/88    Visit Diagnosis:  Ankle weakness - Plan: PT plan of care cert/re-cert  Unsteadiness - Plan: PT plan of care cert/re-cert          [facility] PT Assessment - 03/29/15 0001    Assessment   Medical Diagnosis Cerebral infarction.   Referring Provider Claretta Fraise MD   Onset Date/Surgical Date --  03/12/15.   Precautions   Precautions Fall  Monitor BP please.   Precaution Comments Patient is currently using a FWW.  please monitor ptient at all times for safety.   Restrictions   Weight Bearing Restrictions No   Balance Screen   Has the patient fallen in the past 6 months No   Has the patient had a decrease in activity level because of a fear of falling?  No   Is the patient reluctant to leave their home because of a fear of falling?  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   Cognition   Overall Cognitive Status Within Functional Limits for tasks assessed   ROM / Strength   AROM / PROM / Strength AROM;Strength   AROM   Overall AROM Comments  Normal right LE AROM with the exception of right ankle dorsiflexion.   Strength   Overall Strength Comments Right ankle dorsiflexion= 4-/5 and eversion= 2+/5.   Transfers   Transfers Sit to Stand   Sit to Stand 4: Min guard   Ambulation/Gait   Ambulation/Gait Yes   Gait Comments The patient ambulated safely with a FWW exhibiting a steppage type gait pattern.   Balance   Balance Assessed Yes   Balance comment Performed a Romberg test om patient which was megative but patient required supervision for safety.                   [address] [facility] Adult PT Treatment/Exercise - 03/29/15 0001    Exercises   Exercises Knee/Hip   Knee/Hip Exercises: Aerobic   Nustep Level 3 x 10 minutes.                     PT Long Term Goals - 03/29/15 1250    PT LONG TERM GOAL #1   Title Ind with a HEP.   Time 8   Period Weeks   Status New   PT LONG TERM GOAL #2   Title Right ankle strength= 5/5.   Time 8   Period Weeks  Status New   PT LONG TERM GOAL #3   Title Walk in clinic with a straight cane without gait deviation.   Time 8   Period Weeks   Status New   PT LONG TERM GOAL #4   Title Perform all transfers independent.   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  She had some home health physical therapy and has been complaint to these exercises.  She worked hard and has already seen notable improvement.  She is very motivated to get back to where she was prior to this event.  Her CC is that of right nakle weakness.  She wants to walk without an assistive device again.   Pt will benefit from skilled therapeutic intervention in order to improve on the following deficits Pain;Decreased activity tolerance;Decreased strength   Rehab Potential Excellent   PT Frequency 2x / week   PT Duration 8 weeks    PT Treatment/Interventions Therapeutic activities;Therapeutic exercise;Gait training;Neuromuscular re-education;Balance training   PT Next Visit Plan Right ankle strengthening (especiially dorsiflexion and eversion); core exercise; gait and balance exercises.         Problem List Patient Active Problem List   Diagnosis Date Noted  . HLD (hyperlipidemia)   . Right sided weakness 03/12/2015  . CVA (cerebral infarction) 03/12/2015  . Tobacco abuse 03/12/2015  . DM (diabetes mellitus), type 2, uncontrolled ([place]) 12/23/2014  . Hypertension 12/23/2014    Muadh Creasy, [place] MPT 03/29/2015, 1:08 PM  [facility] [address] [place], [place], [address] Phone: [phone]   Fax:  [phone]  Name: Marilyn Ford MRN: [id number] Date of Birth: [birth year]

## 2015-03-30 ENCOUNTER — Encounter: Payer: Self-pay | Admitting: Neurology

## 2015-03-30 ENCOUNTER — Ambulatory Visit (INDEPENDENT_AMBULATORY_CARE_PROVIDER_SITE_OTHER): Payer: PRIVATE HEALTH INSURANCE | Admitting: Neurology

## 2015-03-30 VITALS — BP 127/83 | HR 94 | Ht 62.0 in | Wt 158.4 lb

## 2015-03-30 DIAGNOSIS — I639 Cerebral infarction, unspecified: Secondary | ICD-10-CM | POA: Diagnosis not present

## 2015-03-30 DIAGNOSIS — I6381 Other cerebral infarction due to occlusion or stenosis of small artery: Secondary | ICD-10-CM

## 2015-03-30 NOTE — Progress Notes (Signed)
[facility] [address] [place]. [place] [address] [phone]       OFFICE FOLLOW-UP NOTE  Marilyn. Marilyn Ford Date of Birth:  Nov 06, 1960 Medical Record Number:  [id number]   HPI: Marilyn Ford is a 83 year Caucasian lady seen today for first office follow-up following hospital admission for stroke 3 weeks ago.Marilyn Ford is a 54 y.o. female with a history of hypertension, diabetes who presented with right-sided weakness. She stated that she awoke with these symptoms, however she might have had some slurred speech even prior to going to bed. The morning of admission, when she awoke she noticed that she was dragging her right foot and therefore sought further care in the emergency department. On review of symptoms, it was difficult to pin her down but she states that she just felt "off" yesterday. LKW: 03/11/15, unclear time tpa given?: no, outside of window.. 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 Carotid Doppler showed no significant extracranial stenosis. Transthoracic echo showed normal ejection fraction without cardiac source of embolism. Hemoglobin A1c was elevated at 6.8. LDL cholesterol could not be calculated due to triglycerides being elevated at 534. Patient was counseled to quit smoking and started on Plavix. She states she has quit smoking completely. She states her blood pressure is under much better controlled and today it is 127/83. Her fasting sugars however still range in the 1:30 range. She is currently doing outpatient physical and occupational therapy and uses a walker for long distances but can walk short distances without help. She still has  mild weakness in the right leg and at times drags it. She's had no falls. She was advised to undergo outpatient 30 day heart monitor but for unclear reason that has not yet been done. She remains on aspirin and Plavix which is tolerating well without significant bleeding or bruising.  ROS:   14 system review of systems is positive for  leg weakness, difficult walking, anxiety, change in appetite and all other systems negative  PMH:  Past Medical History  Diagnosis Date  . Hypertension   . Diabetes mellitus without complication ([place])   . Stroke [facility])     Social History:  Social History   Social History  . Marital Status: Married    Spouse Name: N/A  . Number of Children: N/A  . Years of Education: N/A   Occupational History  . Not on file.   Social History Main Topics  . Smoking status: Former Smoker -- 0.50 packs/day    Types: Cigarettes    Start date: 12/08/1999    Quit date: 03/12/2015  . Smokeless tobacco: Not on file  . Alcohol Use: No  . Drug Use: No  . Sexual Activity: Not on file   Other Topics Concern  . Not on file   Social History Narrative    Medications:   Current Outpatient Prescriptions on File Prior to Visit  Medication Sig Dispense Refill  . amLODipine (NORVASC) 10 MG tablet Take 1 tablet (10 mg total) by mouth daily. For blood pressure 30 tablet 2  . aspirin EC 81 MG EC tablet Take 1 tablet (81 mg total) by mouth daily. 30 tablet 0  . atorvastatin (LIPITOR)  80 MG tablet Take 1 tablet (80 mg total) by mouth daily at 6 PM. 30 tablet 0  . clopidogrel (PLAVIX) 75 MG tablet Take 1 tablet (75 mg total) by mouth daily. 30 tablet 3  . metFORMIN (GLUCOPHAGE-XR) 500 MG 24 hr tablet Take 1 tablet (500 mg total) by mouth daily with breakfast. 30 tablet 2  . Multiple Vitamin (MULTIVITAMIN) capsule Take 1 capsule by mouth daily.    . traZODone (DESYREL) 150 MG tablet 1 or 2 at bedtime for sleep 60 tablet 2  . valsartan-hydrochlorothiazide (DIOVAN-HCT) 320-25 MG  tablet Take 1 tablet by mouth daily. 30 tablet 2   No current facility-administered medications on file prior to visit.    Allergies:   Allergies  Allergen Reactions  . Penicillins Shortness Of Breath    Physical Exam General: well developed, well nourished middle-age Caucasian lady, seated, in no evident distress Head: head normocephalic and atraumatic.  Neck: supple with no carotid or supraclavicular bruits Cardiovascular: regular rate and rhythm, no murmurs Musculoskeletal: no deformity Skin:  no rash/petichiae Vascular:  Normal pulses all extremities Filed Vitals:   03/30/15 1418  BP: 127/83  Pulse: 94   Neurologic Exam Mental Status: Awake and fully alert. Oriented to place and time. Recent and remote memory intact. Attention span, concentration and fund of knowledge appropriate. Mood and affect appropriate.  Cranial Nerves: Fundoscopic exam reveals sharp disc margins. Pupils equal, briskly reactive to light. Extraocular movements full without nystagmus. Visual fields full to confrontation. Hearing intact. Facial sensation intact. Face, tongue, palate moves normally and symmetrically.  Motor: Normal bulk and tone. Normal strength in all tested extremity muscles. Mild weakness of right hip flexors mild right lower extremity drift Sensory.: intact to touch ,pinprick .position and vibratory sensation.  Coordination: Rapid alternating movements normal in all extremities. Finger-to-nose and heel-to-shin performed accurately bilaterally. Gait and Station: Arises from chair without difficulty. Stance is normal. Gait demonstrates normal stride length and balance . Able to heel, toe and tandem walk with mild difficulty.  Reflexes: 1+ and symmetric. Toes downgoing.   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 Multiple vascular risk factors of hypertension, diabetes, smoking, hyperlipidemia and cerebrovascular disease    PLAN: I had a long d/w patient and husband about her recent stroke, risk for recurrent stroke/TIAs, personally independently reviewed imaging studies and stroke evaluation results and answered questions.Continue aspirin 81 mg daily and clopidogrel 75 mg daily  for secondary stroke prevention for 3 months and then discontinue aspirin and stay on clopidogrel alone and maintain strict control of hypertension with blood pressure goal below 130/90, diabetes with hemoglobin A1c goal below 6.5% and lipids with LDL cholesterol goal below 70 mg/dL. I also advised the patient to eat a healthy diet with plenty of whole grains, cereals, fruits and vegetables, exercise regularly and maintain ideal body weight .patient has not yet had outpatient 30 day heart monitor for unclear reason and hence I will order it again.Greater than 50% of time during this 25 minute visit was spent on counseling,explanation of diagnosis, planning of further management, discussion with patient and family and coordination of care Followup in the future with Dr. Erlinda Hong in 6 months or call earlier if necessary.  Antony Contras, MD  Note: This document was prepared with digital dictation and possible smart phrase technology. Any transcriptional errors  that result from this process are unintentional

## 2015-03-30 NOTE — Patient Instructions (Signed)
I had a long d/w patient and husband about her recent stroke, risk for recurrent stroke/TIAs, personally independently reviewed imaging studies and stroke evaluation results and answered questions.Continue aspirin 81 mg daily and clopidogrel 75 mg daily  for secondary stroke prevention for 3 months and then discontinue aspirin and stay on clopidogrel alone and maintain strict control of hypertension with blood pressure goal below 130/90, diabetes with hemoglobin A1c goal below 6.5% and lipids with LDL cholesterol goal below 70 mg/dL. I also advised the patient to eat a healthy diet with plenty of whole grains, cereals, fruits and vegetables, exercise regularly and maintain ideal body weight .patient has not yet had outpatient 30 day heart monitor for unclear reason and hence I will order it again. Followup in the future with Dr. Erlinda Hong in 6 months or call earlier if necessary.  Thank you for coming to see [place] at [facility]. I hope we have been able to provide you high quality care today. You may receive a patient satisfaction survey over the next few weeks. We would appreciate your feedback and comments so that we may continue to improve ourselves and the health of our patients.  Stroke Prevention Some medical conditions and behaviors are associated with an increased chance of having a stroke. You may prevent a stroke by making healthy choices and managing medical conditions. HOW CAN I REDUCE MY RISK OF HAVING A STROKE?   Stay physically active. Get at least 30 minutes of activity on most or all days.  Do not smoke. It may also be helpful to avoid exposure to secondhand smoke.  Limit alcohol use. Moderate alcohol use is considered to be:  No more than 2 drinks per day for men.  No more than 1 drink per day for nonpregnant women.  Eat healthy foods. This involves:  Eating 5 or more servings of fruits and vegetables a day.  Making dietary changes that address high blood pressure  (hypertension), high cholesterol, diabetes, or obesity.  Manage your cholesterol levels.  Making food choices that are high in fiber and low in saturated fat, trans fat, and cholesterol may control cholesterol levels.  Take any prescribed medicines to control cholesterol as directed by your health care provider.  Manage your diabetes.  Controlling your carbohydrate and sugar intake is recommended to manage diabetes.  Take any prescribed medicines to control diabetes as directed by your health care provider.  Control your hypertension.  Making food choices that are low in salt (sodium), saturated fat, trans fat, and cholesterol is recommended to manage hypertension.  Ask your health care provider if you need treatment to lower your blood pressure. Take any prescribed medicines to control hypertension as directed by your health care provider.  If you are 43-53 years of age, have your blood pressure checked every 3-5 years. If you are 77 years of age or older, have your blood pressure checked every year.  Maintain a healthy weight.  Reducing calorie intake and making food choices that are low in sodium, saturated fat, trans fat, and cholesterol are recommended to manage weight.  Stop drug abuse.  Avoid taking birth control pills.  Talk to your health care provider about the risks of taking birth control pills if you are over 58 years old, smoke, get migraines, or have ever had a blood clot.  Get evaluated for sleep disorders (sleep apnea).  Talk to your health care provider about getting a sleep evaluation if you snore a lot or have excessive sleepiness.  Take medicines only as directed by your health care provider.  For some people, aspirin or blood thinners (anticoagulants) are helpful in reducing the risk of forming abnormal blood clots that can lead to stroke. If you have the irregular heart rhythm of atrial fibrillation, you should be on a blood thinner unless there is a good  reason you cannot take them.  Understand all your medicine instructions.  Make sure that other conditions (such as anemia or atherosclerosis) are addressed. SEEK IMMEDIATE MEDICAL CARE IF:   You have sudden weakness or numbness of the face, arm, or leg, especially on one side of the body.  Your face or eyelid droops to one side.  You have sudden confusion.  You have trouble speaking (aphasia) or understanding.  You have sudden trouble seeing in one or both eyes.  You have sudden trouble walking.  You have dizziness.  You have a loss of balance or coordination.  You have a sudden, severe headache with no known cause.  You have new chest pain or an irregular heartbeat. Any of these symptoms may represent a serious problem that is an emergency. Do not wait to see if the symptoms will go away. Get medical help at once. Call your local emergency services (911 in [place] Do not drive yourself to the hospital.   This information is not intended to replace advice given to you by your health care provider. Make sure you discuss any questions you have with your health care provider.   Document Released: 05/31/2004 Document Revised: 05/14/2014 Document Reviewed: 10/24/2012 Elsevier Interactive Patient Education [employer].

## 2015-04-05 ENCOUNTER — Ambulatory Visit (INDEPENDENT_AMBULATORY_CARE_PROVIDER_SITE_OTHER): Payer: PRIVATE HEALTH INSURANCE | Admitting: Family Medicine

## 2015-04-05 ENCOUNTER — Encounter: Payer: Self-pay | Admitting: Family Medicine

## 2015-04-05 ENCOUNTER — Ambulatory Visit: Payer: PRIVATE HEALTH INSURANCE | Admitting: Physical Therapy

## 2015-04-05 VITALS — BP 144/81 | HR 89

## 2015-04-05 VITALS — BP 149/83 | HR 87 | Temp 97.4°F | Ht 62.0 in | Wt 156.0 lb

## 2015-04-05 DIAGNOSIS — R2681 Unsteadiness on feet: Secondary | ICD-10-CM

## 2015-04-05 DIAGNOSIS — I10 Essential (primary) hypertension: Secondary | ICD-10-CM | POA: Diagnosis not present

## 2015-04-05 DIAGNOSIS — M259 Joint disorder, unspecified: Secondary | ICD-10-CM | POA: Diagnosis not present

## 2015-04-05 DIAGNOSIS — I63332 Cerebral infarction due to thrombosis of left posterior cerebral artery: Secondary | ICD-10-CM

## 2015-04-05 DIAGNOSIS — E1159 Type 2 diabetes mellitus with other circulatory complications: Secondary | ICD-10-CM | POA: Diagnosis not present

## 2015-04-05 DIAGNOSIS — I69359 Hemiplegia and hemiparesis following cerebral infarction affecting unspecified side: Secondary | ICD-10-CM | POA: Diagnosis not present

## 2015-04-05 DIAGNOSIS — IMO0002 Reserved for concepts with insufficient information to code with codable children: Secondary | ICD-10-CM

## 2015-04-05 DIAGNOSIS — E1165 Type 2 diabetes mellitus with hyperglycemia: Secondary | ICD-10-CM

## 2015-04-05 DIAGNOSIS — E785 Hyperlipidemia, unspecified: Secondary | ICD-10-CM | POA: Diagnosis not present

## 2015-04-05 DIAGNOSIS — R29898 Other symptoms and signs involving the musculoskeletal system: Secondary | ICD-10-CM

## 2015-04-05 MED ORDER — ATORVASTATIN CALCIUM 80 MG PO TABS: 80.0000 mg | ORAL_TABLET | Freq: Every day | ORAL | Status: DC

## 2015-04-05 MED ORDER — METOPROLOL SUCCINATE ER 50 MG PO TB24: 50.0000 mg | ORAL_TABLET | Freq: Every day | ORAL | Status: DC

## 2015-04-05 MED ORDER — METFORMIN HCL ER 500 MG PO TB24: 500.0000 mg | ORAL_TABLET | Freq: Every day | ORAL | Status: DC

## 2015-04-05 MED ORDER — CLOPIDOGREL BISULFATE 75 MG PO TABS: 75.0000 mg | ORAL_TABLET | Freq: Every day | ORAL | Status: DC

## 2015-04-05 NOTE — Therapy (Signed)
[facility] [place], [place], [address] Phone: [phone]   Fax:  [phone]  Physical Therapy Treatment  Patient Details  Name: Marilyn Ford MRN: [id number] Date of Birth: 1960-07-28 Referring Provider: Claretta Fraise MD  Encounter Date: 04/05/2015      PT End of Session - 04/05/15 1122    Visit Number 2   Number of Visits 16   Date for PT Re-Evaluation 05/31/15   PT Start Time 1114   PT Stop Time 0900   PT Time Calculation (min) 1306 min   Activity Tolerance Patient tolerated treatment well   Behavior During Therapy [facility] for tasks assessed/performed      Past Medical History  Diagnosis Date  . Hypertension   . Diabetes mellitus without complication ([place])   . Stroke [facility])     Past Surgical History  Procedure Laterality Date  . Cholecystectomy    . Cesarean section      Filed Vitals:   04/05/15 1124 04/05/15 1159  BP: 139/87 144/81  Pulse: 85 89    Visit Diagnosis:  Ankle weakness  Unsteadiness      Subjective Assessment - 04/05/15 1208    Subjective Patient has f/u with MD tommorrow.   Currently in Pain? No/denies                         [facility] Adult PT Treatment/Exercise - 04/05/15 0001    Ambulation/Gait   Ambulation Distance (Feet) 300 Feet   Assistive device Straight cane   Gait Pattern Step-to pattern;Step-through pattern;Decreased dorsiflexion - right   Ambulation Surface Level   Gait Comments Focused on step to gait using cane, R, L as VCs and cues to not look down.   Exercises   Exercises Knee/Hip;Ankle   Knee/Hip Exercises: Aerobic   Nustep Level 4 x 10 minutes.   Knee/Hip Exercises: Standing   Heel Raises 3 sets;10 reps  straight, in, out   Heel Raises Limitations difficutl wth toes out   Hip Flexion 1 set;10 reps  then 1x 5   Forward Step Up 2 sets;Right;10 reps;Hand Hold: 2;Step Height: 4"   Forward Step Up Limitations VC's to avoid circumduction   Rocker Board 1 minute  PF/DF   Ankle Exercises: Seated   Ankle Circles/Pumps Strengthening;Both;15 reps  cw/ccw   Towel Inversion/Eversion --  30 reps NO TOWEL   Toe Raise --  30 reps                PT Education - 04/05/15 1208    Education provided Yes   Education Details HEP   Person(s) Educated Patient   Methods Demonstration;Explanation;Handout   Comprehension Verbalized understanding;Returned demonstration             PT Long Term Goals - 03/29/15 1250    PT LONG TERM GOAL #1   Title Ind with a HEP.   Time 8   Period Weeks   Status New   PT LONG TERM GOAL #2   Title Right ankle strength= 5/5.   Time 8   Period Weeks   Status New   PT LONG TERM GOAL #3   Title Walk in clinic with a straight cane without gait deviation.   Time 8   Period Weeks   Status New   PT LONG TERM GOAL #4   Title Perform all transfers independent.   Time 8   Period Weeks   Status New  Plan - 04/05/15 1211    Clinical Impression Statement Patient tolerated therapy well, but stated she was tired at the end. Vitals were monitored. Patient not confident with step to gait pattern with SPC and required SBA to CGA the entire time. She gave the impression her family was pressuring her to use the [facility] or no cane at home, but stated she feels best with the RW. PT encouraged patient to do what felt safest.  Goals are ongoing as only second visit.   Pt will benefit from skilled therapeutic intervention in order to improve on the following deficits Pain;Decreased activity tolerance;Decreased strength   Rehab Potential Excellent   PT Frequency 2x / week   PT Duration 8 weeks   PT Treatment/Interventions Therapeutic activities;Therapeutic exercise;Gait training;Neuromuscular re-education;Balance training   PT Next Visit Plan Continue Right ankle strengthening (especiially dorsiflexion and eversion); core exercise; gait with SPC and balance exercises.   Consulted and  Agree with Plan of Care Patient        Problem List Patient Active Problem List   Diagnosis Date Noted  . HLD (hyperlipidemia)   . Right sided weakness 03/12/2015  . CVA (cerebral infarction) 03/12/2015  . Tobacco abuse 03/12/2015  . DM (diabetes mellitus), type 2, uncontrolled ([place]) 12/23/2014  . Hypertension 12/23/2014    [name] PT  04/05/2015, 12:19 PM  [facility] [address] [place], [place], [address] Phone: [phone]   Fax:  [phone]  Name: Marilyn Ford MRN: [id number] Date of Birth: August 23, 1960

## 2015-04-05 NOTE — Progress Notes (Signed)
Subjective:  Patient ID: Marilyn Ford, female    DOB: Feb 13, 1961  Age: 54 y.o. MRN: [id number]  CC: 2 week recheck   HPI [name] presents for recheck of her recent stroke. She continues in physical therapy. She is beginning to stand transfer and walk short distances just a few steps at a time. She is using a walker under physical therapy supervision. She denies any excessive headache. She says her blood pressure has been running high when it's checked she is concerned that she may have another stroke if her blood pressure goes high again. She denies myalgia from her cholesterol medicine. She is sleeping better with the trazodone  History [name] has a past medical history of Hypertension; Diabetes mellitus without complication ([place]); and Stroke ([place]).   She has past surgical history that includes Cholecystectomy and Cesarean section.   Her family history includes Diabetes in her brother and mother.She reports that she quit smoking about 4 weeks ago. Her smoking use included Cigarettes. She started smoking about 15 years ago. She smoked 0.50 packs per day. She does not have any smokeless tobacco history on file. She reports that she does not drink alcohol or use illicit drugs.  Outpatient Prescriptions Prior to Visit  Medication Sig Dispense Refill  . amLODipine (NORVASC) 10 MG tablet Take 1 tablet (10 mg total) by mouth daily. For blood pressure 30 tablet 2  . aspirin EC 81 MG EC tablet Take 1 tablet (81 mg total) by mouth daily. 30 tablet 0  . Multiple Vitamin (MULTIVITAMIN) capsule Take 1 capsule by mouth daily.    . traZODone (DESYREL) 150 MG tablet 1 or 2 at bedtime for sleep 60 tablet 2  . valsartan-hydrochlorothiazide (DIOVAN-HCT) 320-25 MG tablet Take 1 tablet by mouth daily. 30 tablet 2  . atorvastatin (LIPITOR) 80 MG tablet Take 1 tablet (80 mg total) by mouth daily at 6 PM. 30 tablet 0  . clopidogrel (PLAVIX) 75 MG tablet Take 1 tablet (75 mg total) by mouth daily. 30  tablet 3  . metFORMIN (GLUCOPHAGE-XR) 500 MG 24 hr tablet Take 1 tablet (500 mg total) by mouth daily with breakfast. 30 tablet 2   No facility-administered medications prior to visit.    ROS Review of Systems  Constitutional: Negative for fever, activity change and appetite change.  HENT: Negative for congestion, rhinorrhea and sore throat.   Eyes: Negative for visual disturbance.  Respiratory: Negative for cough and shortness of breath.   Cardiovascular: Negative for chest pain and palpitations.  Gastrointestinal: Negative for nausea, abdominal pain and diarrhea.  Genitourinary: Negative for dysuria.  Musculoskeletal: Negative for myalgias and arthralgias.  Neurological: Positive for light-headedness.  Psychiatric/Behavioral: Positive for decreased concentration. Negative for behavioral problems, confusion, dysphoric mood and agitation. The patient is nervous/anxious.     Objective:  BP 149/83 mmHg  Pulse 87  Temp(Src) 97.4 F (36.3 C) (Oral)  Ht 5\' 2"  (1.575 m)  Wt 156 lb (70.761 kg)  BMI 28.53 kg/m2  BP Readings from Last 3 Encounters:  04/07/15 138/87  04/05/15 149/83  04/05/15 144/81    Wt Readings from Last 3 Encounters:  04/05/15 156 lb (70.761 kg)  03/30/15 158 lb 6.4 oz (71.85 kg)  03/22/15 157 lb (71.215 kg)     Physical Exam  Lab Results  Component Value Date   HGBA1C 6.8* 03/13/2015   HGBA1C 6.8 12/14/2014    Lab Results  Component Value Date   WBC 8.5 03/13/2015   HGB 15.7* 03/13/2015  HCT 43.9 03/13/2015   PLT 264 03/13/2015   GLUCOSE 177* 03/12/2015   CHOL 302* 03/13/2015   TRIG 534* 03/13/2015   HDL 39* 03/13/2015   LDLCALC UNABLE TO CALCULATE IF TRIGLYCERIDE OVER 400 mg/dL 03/13/2015   ALT 24 03/12/2015   AST 26 03/12/2015   NA 138 03/12/2015   K 3.8 03/12/2015   CL 97* 03/12/2015   CREATININE 0.85 03/12/2015   BUN 20 03/12/2015   CO2 23 03/12/2015   HGBA1C 6.8* 03/13/2015    Dg Chest 2 View  03/12/2015  CLINICAL DATA:   54 year old female with history of altered mental status and headache for 1 week. Blurred vision and weakness today. EXAM: CHEST  2 VIEW COMPARISON:  No priors. FINDINGS: Lung volumes are normal. No consolidative airspace disease. No pleural effusions. No pneumothorax. No pulmonary nodule or mass noted. Pulmonary vasculature and the cardiomediastinal silhouette are within normal limits. Atherosclerosis in the thoracic aorta. IMPRESSION: 1.  No radiographic evidence of acute cardiopulmonary disease. 2. Atherosclerosis. Electronically Signed   By: Vinnie Langton M.D.   On: 03/12/2015 10:05   Ct Head Wo Contrast  03/12/2015  CLINICAL DATA:  Right-sided weakness and numbness since yesterday. [place] out of bed this morning, now with confusion. EXAM: CT HEAD WITHOUT CONTRAST TECHNIQUE: Contiguous axial images were obtained from the base of the skull through the vertex without intravenous contrast. COMPARISON:  None. FINDINGS: Regional soft tissues appear normal. No radiopaque foreign body. No displaced calvarial fracture. Bilateral basal ganglial lacunar infarcts, right greater than left (representative (image 18, series 2). Bilateral basal ganglial calcifications. Scattered periventricular hypodensities compatible with microvascular ischemic disease. The gray-white differentiation is otherwise well maintained without CT evidence of superimposed acute large territory infarct. No intraparenchymal or extra-axial mass or hemorrhage. Unchanged size and configuration of the ventricles and basilar cisterns. No midline shift. There is diffuse increased attenuation intracranial blood pool suggestive of volume depletion. Limited visualization the paranasal sinuses and mastoid air cells is normal. No air-fluid levels. IMPRESSION: Advanced microvascular ischemic disease without definite superimposed acute intracranial process. Electronically Signed   By: Sandi Mariscal M.D.   On: 03/12/2015 10:30   Mr Brain Wo  Contrast  03/12/2015  CLINICAL DATA:  54 year old female with hypertension, hyperglycemia. One week of headaches, progressed at 0300 hours with associated right extremity weakness and tingling at that time. Initial encounter. EXAM: MRI HEAD WITHOUT CONTRAST MRA HEAD WITHOUT CONTRAST TECHNIQUE: Multiplanar, multiecho pulse sequences of the brain and surrounding structures were obtained without intravenous contrast. Angiographic images of the head were obtained using MRA technique without contrast. COMPARISON:  Head CT without contrast 1010 hours today. FINDINGS: MRI HEAD FINDINGS 16 mm curvilinear area of restricted diffusion tracking from the posterior left corona radiata through the external capsule toward the lentiform nuclei. Mild associated T2 and FLAIR hyperintensity. No associated acute hemorrhage or mass effect. Superimposed chronic lacunar infarcts, some with hemosiderin, in the right basal ganglia, bilateral thalami, and anterior limb of the left external capsule. Chronic lacunar infarct in the left pons. There are also superimposed small scattered white matter foci of restricted diffusion in the left occipital lobe best seen on series 4, image 17. Minimal associated T2 and FLAIR hyperintensity with no hemorrhage or mass effect. No right hemisphere or posterior fossa restricted diffusion. Major intracranial vascular flow voids are preserved. No cortical encephalomalacia. No other chronic cerebral blood products. No midline shift, mass effect, evidence of mass lesion, ventriculomegaly, extra-axial collection or acute intracranial hemorrhage. Cervicomedullary junction and pituitary are  within normal limits. Negative visualized cervical spine. Visible internal auditory structures appear normal. Mastoids and paranasal sinuses are clear. Negative orbit and scalp soft tissues. Visualized bone marrow signal is within normal limits. MRA HEAD FINDINGS Antegrade flow in the posterior circulation with dominant distal  left vertebral artery. Both PICA origins are patent. Patent vertebrobasilar junction. No basilar artery stenosis. SCA and PCA origins are normal. Right PCA branches are within normal limits. Posterior communicating arteries are diminutive or absent. There is a moderate to severe focal stenosis in the left PCA P2 segment (Series 6, image 79). Preserved distal left PCA flow. Antegrade flow in both ICA siphons. No siphon stenosis. Ophthalmic artery origins are normal. Patent carotid termini. Dominant left ACA. ACA origins are normal. Diminutive anterior communicating artery. Visualized ACA branches are within normal limits. Right MCA M1 segment is mildly irregular without stenosis. Visualized MCA branches are within normal limits. Left MCA origin and M1 segment are patent. Mild M1 segment irregularity without stenosis. Patent left MCA bifurcation. No left MCA branch occlusion identified. IMPRESSION: 1. Acute lacunar infarct extending from the posterior left corona radiata to the external capsule. No hemorrhage or mass effect. 2. Superimposed scattered small acute left PCA territory white matter infarcts in the occipital lobe no hemorrhage or mass effect. Favor synchronous atherosclerotic/small vessel disease rather than sequelae of emboli. 3. Intracranial MRA remarkable for moderate to severe left PCA P2 segment stenosis. No anterior circulation stenosis or left MCA branch occlusion identified. 4. Underlying advanced chronic small vessel ischemia in the bilateral deep gray matter nuclei and brainstem. Electronically Signed   By: Genevie Ann M.D.   On: 03/12/2015 14:14   Mr Jodene Nam Head/brain Wo Cm  03/12/2015  CLINICAL DATA:  54 year old female with hypertension, hyperglycemia. One week of headaches, progressed at 0300 hours with associated right extremity weakness and tingling at that time. Initial encounter. EXAM: MRI HEAD WITHOUT CONTRAST MRA HEAD WITHOUT CONTRAST TECHNIQUE: Multiplanar, multiecho pulse sequences of the  brain and surrounding structures were obtained without intravenous contrast. Angiographic images of the head were obtained using MRA technique without contrast. COMPARISON:  Head CT without contrast 1010 hours today. FINDINGS: MRI HEAD FINDINGS 16 mm curvilinear area of restricted diffusion tracking from the posterior left corona radiata through the external capsule toward the lentiform nuclei. Mild associated T2 and FLAIR hyperintensity. No associated acute hemorrhage or mass effect. Superimposed chronic lacunar infarcts, some with hemosiderin, in the right basal ganglia, bilateral thalami, and anterior limb of the left external capsule. Chronic lacunar infarct in the left pons. There are also superimposed small scattered white matter foci of restricted diffusion in the left occipital lobe best seen on series 4, image 17. Minimal associated T2 and FLAIR hyperintensity with no hemorrhage or mass effect. No right hemisphere or posterior fossa restricted diffusion. Major intracranial vascular flow voids are preserved. No cortical encephalomalacia. No other chronic cerebral blood products. No midline shift, mass effect, evidence of mass lesion, ventriculomegaly, extra-axial collection or acute intracranial hemorrhage. Cervicomedullary junction and pituitary are within normal limits. Negative visualized cervical spine. Visible internal auditory structures appear normal. Mastoids and paranasal sinuses are clear. Negative orbit and scalp soft tissues. Visualized bone marrow signal is within normal limits. MRA HEAD FINDINGS Antegrade flow in the posterior circulation with dominant distal left vertebral artery. Both PICA origins are patent. Patent vertebrobasilar junction. No basilar artery stenosis. SCA and PCA origins are normal. Right PCA branches are within normal limits. Posterior communicating arteries are diminutive or absent. There is a  moderate to severe focal stenosis in the left PCA P2 segment (Series 6, image  79). Preserved distal left PCA flow. Antegrade flow in both ICA siphons. No siphon stenosis. Ophthalmic artery origins are normal. Patent carotid termini. Dominant left ACA. ACA origins are normal. Diminutive anterior communicating artery. Visualized ACA branches are within normal limits. Right MCA M1 segment is mildly irregular without stenosis. Visualized MCA branches are within normal limits. Left MCA origin and M1 segment are patent. Mild M1 segment irregularity without stenosis. Patent left MCA bifurcation. No left MCA branch occlusion identified. IMPRESSION: 1. Acute lacunar infarct extending from the posterior left corona radiata to the external capsule. No hemorrhage or mass effect. 2. Superimposed scattered small acute left PCA territory white matter infarcts in the occipital lobe no hemorrhage or mass effect. Favor synchronous atherosclerotic/small vessel disease rather than sequelae of emboli. 3. Intracranial MRA remarkable for moderate to severe left PCA P2 segment stenosis. No anterior circulation stenosis or left MCA branch occlusion identified. 4. Underlying advanced chronic small vessel ischemia in the bilateral deep gray matter nuclei and brainstem. Electronically Signed   By: Genevie Ann M.D.   On: 03/12/2015 14:14    Assessment & Plan:   Sundas was seen today for 2 week recheck.  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 Take with or immediately following a meal. -     metFORMIN (GLUCOPHAGE-XR) 500 MG 24 hr tablet; Take 1 tablet (500  mg total) by mouth daily with breakfast. -     atorvastatin (LIPITOR) 80 MG tablet; Take 1 tablet (80 mg total) by mouth daily at 6 PM. -     clopidogrel (PLAVIX) 75 MG tablet; Take 1 tablet (75 mg total) by mouth daily.   I am having Ms. Therrien start on metoprolol succinate. I am also having her maintain her multivitamin, aspirin, traZODone, amLODipine, valsartan-hydrochlorothiazide, metFORMIN, atorvastatin, and clopidogrel.  Meds ordered this encounter  Medications  . metoprolol succinate (TOPROL-XL) 50 MG 24 hr tablet    Sig: Take 1 tablet (50 mg total) by mouth daily. Take with or immediately following a meal.    Dispense:  30 tablet    Refill:  0  . metFORMIN (GLUCOPHAGE-XR) 500 MG 24 hr tablet    Sig: Take 1 tablet (500 mg total) by mouth daily with breakfast.    Dispense:  90 tablet    Refill:  3  . atorvastatin (LIPITOR) 80 MG tablet    Sig: Take 1 tablet (80 mg total) by mouth daily at 6 PM.    Dispense:  30 tablet    Refill:  5  . clopidogrel (PLAVIX) 75 MG tablet    Sig: Take 1 tablet (75 mg total) by mouth daily.    Dispense:  30 tablet    Refill:  3     Follow-up: Return in about 2 weeks (around 04/19/2015).  Claretta Fraise, M.D.

## 2015-04-05 NOTE — Patient Instructions (Signed)
  Ankle Circles   Slowly rotate right foot and ankle clockwise then counterclockwise. Gradually increase range of motion. Avoid pain. Circle __10__ times each direction per set. Do ____ sets per session. Do 2-3____ sessions per day.  http://orth.exer.us/30   Copyright  VHI. All rights reserved.    ROM:  Dorsiflexion  DO IN SITTING   Sit in chair. Raise toes upward.  Move through full range of motion. Repeat __30__ times per set. Do ____ sets per session. Do __3-4__ sessions per day.  http://orth.exer.us/34   Copyright  VHI. All rights reserved.    ROM: Inversion / Eversion Do in long sitting.   Sitting with legs on bed, feet off bed, gently turn ankle and foot in and out. Move through full range of motion. Avoid pain. Repeat _30___ times per set. Do ____ sets per session. Do _3-4___ sessions per day.  http://orth.exer.us/36   Copyright  VHI. All rights reserved.   [name], PT 04/05/2015 12:04 PM [facility] [place], [place], [address] Phone: [phone]   Fax:  [phone]

## 2015-04-06 ENCOUNTER — Ambulatory Visit (INDEPENDENT_AMBULATORY_CARE_PROVIDER_SITE_OTHER): Payer: PRIVATE HEALTH INSURANCE | Admitting: Family Medicine

## 2015-04-06 DIAGNOSIS — I10 Essential (primary) hypertension: Secondary | ICD-10-CM

## 2015-04-06 DIAGNOSIS — Z720 Tobacco use: Secondary | ICD-10-CM

## 2015-04-06 DIAGNOSIS — E1165 Type 2 diabetes mellitus with hyperglycemia: Secondary | ICD-10-CM

## 2015-04-06 DIAGNOSIS — I69351 Hemiplegia and hemiparesis following cerebral infarction affecting right dominant side: Secondary | ICD-10-CM

## 2015-04-07 ENCOUNTER — Ambulatory Visit: Payer: PRIVATE HEALTH INSURANCE | Attending: Family Medicine | Admitting: Physical Therapy

## 2015-04-07 VITALS — BP 138/87 | HR 75

## 2015-04-07 DIAGNOSIS — R531 Weakness: Secondary | ICD-10-CM | POA: Diagnosis present

## 2015-04-07 DIAGNOSIS — R2681 Unsteadiness on feet: Secondary | ICD-10-CM | POA: Insufficient documentation

## 2015-04-07 DIAGNOSIS — M259 Joint disorder, unspecified: Secondary | ICD-10-CM | POA: Insufficient documentation

## 2015-04-07 DIAGNOSIS — R29898 Other symptoms and signs involving the musculoskeletal system: Secondary | ICD-10-CM

## 2015-04-07 NOTE — Therapy (Signed)
[facility] [place], [place], [address] Phone: [phone]   Fax:  [phone]  Physical Therapy Treatment  Patient Details  Name: Marilyn Ford MRN: [id number] Date of Birth: December 11, 1960 Referring Provider: Claretta Fraise MD  Encounter Date: 04/07/2015      PT End of Session - 04/07/15 1119    Visit Number 3   Number of Visits 16   Date for PT Re-Evaluation 05/31/15   PT Start Time 1116   PT Stop Time 1159   PT Time Calculation (min) 43 min   Activity Tolerance Patient tolerated treatment well   Behavior During Therapy [facility] for tasks assessed/performed      Past Medical History  Diagnosis Date  . Hypertension   . Diabetes mellitus without complication ([place])   . Stroke [facility])     Past Surgical History  Procedure Laterality Date  . Cholecystectomy    . Cesarean section      Filed Vitals:   04/07/15 1128 04/07/15 1157  BP: 136/84 138/87  Pulse: 74 75    Visit Diagnosis:  Ankle weakness  Unsteadiness  Generalized weakness      Subjective Assessment - 04/07/15 1120    Subjective I was tired after my last visit but just napped and was better then. My doctor added another BP medication.   Currently in Pain? No/denies                         [facility] Adult PT Treatment/Exercise - 04/07/15 0001    Knee/Hip Exercises: Aerobic   Nustep Level 4 x 10 minutes.   Knee/Hip Exercises: Standing   Heel Raises 3 sets;10 reps  straight, in, out   Hip Abduction Both;2 sets;10 reps   Lateral Step Up Right;15 reps;Hand Hold: 2;Step Height: 4"   Lateral Step Up Limitations Increase to 6 next time   Forward Step Up 15 reps;Hand Hold: 2;Step Height: 4"   Forward Step Up Limitations increase to 6 next time   Knee/Hip Exercises: Supine   Short Arc Quad Sets Strengthening;Right;20 reps   Bridges Limitations Bridge 2x10   Single Leg Bridge Strengthening;Right;10 reps   Straight Leg Raises  Strengthening;Right;2 sets;10 reps   Knee/Hip Exercises: Prone   Hamstring Curl 20 reps   Hamstring Curl Limitations no eccentric control   Hip Extension Strengthening;Right;2 sets;10 reps   Ankle Exercises: Seated   Towel Inversion/Eversion --  20 reps NO TOWEL   Other Seated Ankle Exercises ankle isolator 1.5# DF x 20                     PT Long Term Goals - 03/29/15 1250    PT LONG TERM GOAL #1   Title Ind with a HEP.   Time 8   Period Weeks   Status New   PT LONG TERM GOAL #2   Title Right ankle strength= 5/5.   Time 8   Period Weeks   Status New   PT LONG TERM GOAL #3   Title Walk in clinic with a straight cane without gait deviation.   Time 8   Period Weeks   Status New   PT LONG TERM GOAL #4   Title Perform all transfers independent.   Time 8   Period Weeks   Status New               Plan - 04/07/15 1230    Clinical Impression Statement Patient did a  great job today with all exercises. Her vitals remained constant and she stated at the end that she did not feel fatigued like she did the other day. She has very little eccentric HS control with prone HS curls requiring assist to lower to avoid slamming table. She did not want to work on gait with SPC today.   PT Next Visit Plan Continue RLE strength, including ankle DF/ever; HS, hip flex/abd/ext; core exercise; gait with SPC and balance exercises.   Consulted and Agree with Plan of Care Patient        Problem List Patient Active Problem List   Diagnosis Date Noted  . HLD (hyperlipidemia)   . Right sided weakness 03/12/2015  . CVA (cerebral infarction) 03/12/2015  . Tobacco abuse 03/12/2015  . DM (diabetes mellitus), type 2, uncontrolled ([place]) 12/23/2014  . Hypertension 12/23/2014    [name] PT  04/07/2015, 12:35 PM  [facility] [address] [place], [place], [address] Phone: [phone]   Fax:  [phone]  Name: Marilyn Ford MRN: [id number] Date of Birth: 09-Nov-1960

## 2015-04-08 ENCOUNTER — Telehealth: Payer: Self-pay | Admitting: Family Medicine

## 2015-04-08 NOTE — Telephone Encounter (Signed)
I discussed the blood pressure with the patient. She is in no distress with no symptoms. The blood pressure is coming down to 170. I do not believe the prolonged is the cause. I would like her to rest over the weekend that is the next 3 days. Monitor her blood pressure, avoid salt and check back in with me on Monday, 3 days from now. If she develops symptoms such as severe headache vision changes etc. she knows to back to the emergency department in the meantime.  Marilyn Fraise, MD

## 2015-04-08 NOTE — Telephone Encounter (Signed)
Her BP yesterday was 190/90 and this morning it is 178/90. She has taking all of her medications this am.

## 2015-04-11 ENCOUNTER — Encounter: Payer: PRIVATE HEALTH INSURANCE | Admitting: Physical Therapy

## 2015-04-13 ENCOUNTER — Ambulatory Visit: Payer: PRIVATE HEALTH INSURANCE | Admitting: Physical Therapy

## 2015-04-13 ENCOUNTER — Encounter: Payer: Self-pay | Admitting: Physical Therapy

## 2015-04-13 VITALS — BP 112/71 | HR 74

## 2015-04-13 DIAGNOSIS — R2681 Unsteadiness on feet: Secondary | ICD-10-CM

## 2015-04-13 DIAGNOSIS — R531 Weakness: Secondary | ICD-10-CM

## 2015-04-13 DIAGNOSIS — R29898 Other symptoms and signs involving the musculoskeletal system: Secondary | ICD-10-CM

## 2015-04-13 DIAGNOSIS — M259 Joint disorder, unspecified: Secondary | ICD-10-CM | POA: Diagnosis not present

## 2015-04-13 NOTE — Therapy (Signed)
[facility] [place], [place], [address] Phone: [phone]   Fax:  [phone]  Physical Therapy Treatment  Patient Details  Name: Marilyn Ford MRN: [id number] Date of Birth: 27-Aug-1960 Referring Provider: Claretta Fraise MD  Encounter Date: 04/13/2015      PT End of Session - 04/13/15 0918    Visit Number 4   Number of Visits 16   Date for PT Re-Evaluation 05/31/15   PT Start Time 0904   PT Stop Time 0942   PT Time Calculation (min) 38 min   Activity Tolerance Patient tolerated treatment well   Behavior During Therapy [facility] for tasks assessed/performed      Past Medical History  Diagnosis Date  . Hypertension   . Diabetes mellitus without complication ([place])   . Stroke [facility])     Past Surgical History  Procedure Laterality Date  . Cholecystectomy    . Cesarean section      Filed Vitals:   04/13/15 0916  BP: 112/71  Pulse: 74  SpO2: 97%    Visit Diagnosis:  Ankle weakness  Unsteadiness  Generalized weakness      Subjective Assessment - 04/13/15 0919    Subjective States that biggest issue post-stroke is walking. States that she was worked so hard in last treatment that she started menstrual cycle that she had not had in over 1 year. Requested no prone exercises today.   Currently in Pain? No/denies            [facility] PT Assessment - 04/13/15 0001    Assessment   Medical Diagnosis Cerebral infarction.   Onset Date/Surgical Date 03/12/15                     [facility] Adult PT Treatment/Exercise - 04/13/15 0001    Knee/Hip Exercises: Aerobic   Nustep Level 4 x 14 minutes.   Knee/Hip Exercises: Standing   Hip Abduction Both;2 sets;10 reps   Lateral Step Up Right;2 sets;10 reps;Hand Hold: 2;Step Height: 6"   Forward Step Up Right;2 sets;10 reps;Hand Hold: 2;Step Height: 6"   Knee/Hip Exercises: Seated   Long Arc Quad Strengthening;Right;2 sets;10 reps;Weights   Long Arc Quad Weight  2 lbs.   Knee/Hip Exercises: Supine   Bridges Limitations Bridge 2x10   Straight Leg Raises Strengthening;Right;Other (comment)  x25 reps   Other Supine Knee/Hip Exercises Supine B clamshell red theraband x25 reps   Ankle Exercises: Standing   Heel Raises 20 reps   Ankle Exercises: Seated   Other Seated Ankle Exercises AROM R ankle ROM 4D x20 reps                     PT Long Term Goals - 04/13/15 0944    PT LONG TERM GOAL #1   Title Ind with a HEP.   Time 8   Period Weeks   Status Achieved   PT LONG TERM GOAL #2   Title Right ankle strength= 5/5.   Time 8   Period Weeks   Status On-going   PT LONG TERM GOAL #3   Title Walk in clinic with a straight cane without gait deviation.   Time 8   Period Weeks   Status On-going   PT LONG TERM GOAL #4   Title Perform all transfers independent.   Time 8   Period Weeks   Status On-going               Plan - 04/13/15  0944    Clinical Impression Statement Patient tolerated today's treatment very well with no complants of pain only of fatigue following standing exercises. Progressed well to 6" step ups forward and laterally in which patient noted fatigue following exercises. Continues to demonstrate fatigue and weakness in R ankle with AROM exercises. Continues to require FWW for ambulation in clinic although patient reported at times at home she forgets to use FWW and is aware of safety consequences. Demonstrated slight R hip abduction with forward step up to 6" step. Denied pain following today's treatment only fatigue. Vitals taken following today's treatment: BP 117/80, 78 bpm, 97% O2 sat.   Pt will benefit from skilled therapeutic intervention in order to improve on the following deficits Pain;Decreased activity tolerance;Decreased strength   Rehab Potential Excellent   PT Frequency 2x / week   PT Duration 8 weeks   PT Treatment/Interventions Therapeutic activities;Therapeutic exercise;Gait training;Neuromuscular  re-education;Balance training   PT Next Visit Plan Continue RLE strength, including ankle DF/ever; HS, hip flex/abd/ext; core exercise; gait with SPC and balance exercises.   Consulted and Agree with Plan of Care Patient        Problem List Patient Active Problem List   Diagnosis Date Noted  . HLD (hyperlipidemia)   . Right sided weakness 03/12/2015  . CVA (cerebral infarction) 03/12/2015  . Tobacco abuse 03/12/2015  . DM (diabetes mellitus), type 2, uncontrolled ([place]) 12/23/2014  . Hypertension 12/23/2014    Wynelle Fanny, PTA 04/13/2015, 10:33 AM  [facility] [address] [place], [place], [address] Phone: [phone]   Fax:  [phone]  Name: Marilyn Ford MRN: [id number] Date of Birth: 09-02-1960

## 2015-04-14 ENCOUNTER — Ambulatory Visit: Payer: PRIVATE HEALTH INSURANCE | Admitting: Physical Therapy

## 2015-04-14 VITALS — BP 135/80 | HR 76

## 2015-04-14 DIAGNOSIS — R531 Weakness: Secondary | ICD-10-CM

## 2015-04-14 DIAGNOSIS — M259 Joint disorder, unspecified: Secondary | ICD-10-CM | POA: Diagnosis not present

## 2015-04-14 DIAGNOSIS — R2681 Unsteadiness on feet: Secondary | ICD-10-CM

## 2015-04-14 NOTE — Therapy (Signed)
[facility] [place], [place], [address] Phone: [phone]   Fax:  [phone]  Physical Therapy Treatment  Patient Details  Name: Marilyn Ford MRN: [id number] Date of Birth: [birth year] Referring Provider: Claretta Fraise MD  Encounter Date: 04/14/2015      PT End of Session - 04/14/15 1118    Visit Number 5   Number of Visits 16   Date for PT Re-Evaluation 05/31/15   PT Start Time 1116   PT Stop Time 1144   PT Time Calculation (min) 28 min   Activity Tolerance Patient tolerated treatment well   Behavior During Therapy [facility] for tasks assessed/performed      Past Medical History  Diagnosis Date  . Hypertension   . Diabetes mellitus without complication ([place])   . Stroke [facility])     Past Surgical History  Procedure Laterality Date  . Cholecystectomy    . Cesarean section      Filed Vitals:   04/14/15 1126 04/14/15 1143  BP: 132/82 135/80  Pulse: 69 76    Visit Diagnosis:  Unsteadiness  Generalized weakness      Subjective Assessment - 04/14/15 1119    Subjective Patient states she walked without any AD around the house last night. She felt safe and did not use any furniture as support. She presents with RW today. She feels stronger overall. PT advised patient to call her OBGYN regarding her having bleeding.  Patient also stated that she had to leave early today due to her ride.   Patient Stated Goals learn how to walk and get stronger   Currently in Pain? No/denies            [facility] PT Assessment - 04/14/15 0001    Assessment   Medical Diagnosis Cerebral infarction.   Onset Date/Surgical Date 03/12/15                     [facility] Adult PT Treatment/Exercise - 04/14/15 0001    Knee/Hip Exercises: Aerobic   Nustep L6 x 6 min   Knee/Hip Exercises: Standing   Knee Flexion Strengthening;Right;2 sets;10 reps   Hip Flexion Stengthening;Right;2 sets;10 reps, 2#   Hip Abduction Both;2  sets;10 reps  2#   Lateral Step Up Right;2 sets;10 reps;Hand Hold: 2;Step Height: 6"   Forward Step Up Right;2 sets;10 reps;Hand Hold: 2;Step Height: 6"   Knee/Hip Exercises: Seated   Sit to Sand 20 reps                     PT Long Term Goals - 04/13/15 0944    PT LONG TERM GOAL #1   Title Ind with a HEP.   Time 8   Period Weeks   Status Achieved   PT LONG TERM GOAL #2   Title Right ankle strength= 5/5.   Time 8   Period Weeks   Status On-going   PT LONG TERM GOAL #3   Title Walk in clinic with a straight cane without gait deviation.   Time 8   Period Weeks   Status On-going   PT LONG TERM GOAL #4   Title Perform all transfers independent.   Time 8   Period Weeks   Status On-going               Plan - 04/14/15 1152    Clinical Impression Statement Patient tolerated therex well today. Vitals were monitored and systolic was within safe limit but notably higher  than yesterday (130s vs 117). Patient continues to require short rests between standing exercises, but overall shows improved strength.    PT Next Visit Plan Continue RLE strength, including ankle DF/ever; HS, hip flex/abd/ext; core exercise; gait with SPC and balance exercises. F/U with pt calling OB.        Problem List Patient Active Problem List   Diagnosis Date Noted  . HLD (hyperlipidemia)   . Right sided weakness 03/12/2015  . CVA (cerebral infarction) 03/12/2015  . Tobacco abuse 03/12/2015  . DM (diabetes mellitus), type 2, uncontrolled ([place]) 12/23/2014  . Hypertension 12/23/2014    [name] PT  04/14/2015, 12:16 PM  [facility] [address] [place], [place], [address] Phone: [phone]   Fax:  [phone]  Name: Marilyn Ford MRN: [id number] Date of Birth: March 17, 1961

## 2015-04-19 ENCOUNTER — Ambulatory Visit: Payer: PRIVATE HEALTH INSURANCE | Admitting: Physical Therapy

## 2015-04-19 DIAGNOSIS — R29898 Other symptoms and signs involving the musculoskeletal system: Secondary | ICD-10-CM

## 2015-04-19 DIAGNOSIS — M259 Joint disorder, unspecified: Secondary | ICD-10-CM | POA: Diagnosis not present

## 2015-04-19 DIAGNOSIS — R531 Weakness: Secondary | ICD-10-CM

## 2015-04-19 DIAGNOSIS — R2681 Unsteadiness on feet: Secondary | ICD-10-CM

## 2015-04-19 NOTE — Therapy (Signed)
[facility] [place], [place], [address] Phone: [phone]   Fax:  [phone]  Physical Therapy Treatment  Patient Details  Name: Marilyn Ford MRN: [id number] Date of Birth: 21-Nov-1960 Referring Provider: Claretta Fraise MD  Encounter Date: 04/19/2015      PT End of Session - 04/19/15 1123    Visit Number 6   Number of Visits 16   Date for PT Re-Evaluation 05/31/15   PT Start Time 1115   PT Stop Time 1204   PT Time Calculation (min) 49 min   Activity Tolerance Patient tolerated treatment well   Behavior During Therapy [facility] for tasks assessed/performed      Past Medical History  Diagnosis Date  . Hypertension   . Diabetes mellitus without complication ([place])   . Stroke [facility])     Past Surgical History  Procedure Laterality Date  . Cholecystectomy    . Cesarean section      There were no vitals filed for this visit.  Visit Diagnosis:  Unsteadiness  Generalized weakness  Ankle weakness      Subjective Assessment - 04/19/15 1124    Subjective Patient is doing well. She states she is walking more without AD. When she is out of the house she walks with her husband holding her arm. She followed up with OBGYN.    Patient Stated Goals learn how to walk and get stronger   Currently in Pain? No/denies            [facility] PT Assessment - 04/19/15 0001    Strength   Overall Strength Comments R ankle DF 4+/5, ever 4/5, Inv 4+/5   Transfers   Transfers Sit to Stand   Sit to Stand 7: Independent                     [facility] Adult PT Treatment/Exercise - 04/19/15 0001    Knee/Hip Exercises: Aerobic   Nustep L4 x 11 min, L 6 x 4 min   Knee/Hip Exercises: Standing   Knee Flexion Strengthening;Right;10 reps;1 set  5 second hold   Hip Flexion Stengthening;Right;2 sets;10 reps  2#   Hip Abduction Both;10 reps;3 sets  2#   Lateral Step Up Right;2 sets;10 reps;Step Height: 6";Hand Hold: 1   Forward  Step Up Right;2 sets;10 reps;Step Height: 6";Hand Hold: 2   Knee/Hip Exercises: Seated   Sit to Sand 20 reps   Knee/Hip Exercises: Supine   Bridges Limitations Bridge 2x10   Single Leg Bridge Right;5 reps   Straight Leg Raises Strengthening;Right;Other (comment);2 sets;10 reps  2#   Knee/Hip Exercises: Sidelying   Clams 20  easy   Ankle Exercises: Seated   Other Seated Ankle Exercises ankle isolator DF 2# x 10, 1# ever 2x 10                     PT Long Term Goals - 04/19/15 1205    PT LONG TERM GOAL #1   Title Ind with a HEP.   Time 8   Period Weeks   Status Achieved   PT LONG TERM GOAL #2   Title Right ankle strength= 5/5.   Time 8   Period Weeks   Status On-going   PT LONG TERM GOAL #3   Title Walk in clinic with a straight cane without gait deviation.   Time 8   Period Weeks   Status On-going   PT LONG TERM GOAL #4   Title Perform  all transfers independent.   Time 8   Period Weeks   Status Achieved               Plan - 04/19/15 1206    Clinical Impression Statement Patient is progressing very well with therapy. She has met her LTG to be I with transfers and is close to meeting her strength goals. She still has ankle weakness which worsens as she fatigues overall. She amb primarily without AD at home and uses the RW in the community unless her husband is there for CGA and no AD. She continues to evert the R ankle with amb.   Pt will benefit from skilled therapeutic intervention in order to improve on the following deficits Pain;Decreased activity tolerance;Decreased strength   Rehab Potential Excellent   PT Frequency 2x / week   PT Duration 8 weeks   PT Treatment/Interventions Therapeutic activities;Therapeutic exercise;Gait training;Neuromuscular re-education;Balance training   PT Next Visit Plan Continue RLE strength, including ankle DF/ever; HS, hip flex/abd/ext; core exercise; gait with SPC and balance exercises.   Consulted and Agree with  Plan of Care Patient        Problem List Patient Active Problem List   Diagnosis Date Noted  . HLD (hyperlipidemia)   . Right sided weakness 03/12/2015  . CVA (cerebral infarction) 03/12/2015  . Tobacco abuse 03/12/2015  . DM (diabetes mellitus), type 2, uncontrolled ([place]) 12/23/2014  . Hypertension 12/23/2014    [name] PT  04/19/2015, 4:02 PM  [facility] [address] [place], [place], [address] Phone: [phone]   Fax:  [phone]  Name: Marilyn Ford MRN: [id number] Date of Birth: 1960/10/08

## 2015-04-20 ENCOUNTER — Ambulatory Visit (INDEPENDENT_AMBULATORY_CARE_PROVIDER_SITE_OTHER): Payer: PRIVATE HEALTH INSURANCE | Admitting: Family Medicine

## 2015-04-20 ENCOUNTER — Encounter: Payer: Self-pay | Admitting: Family Medicine

## 2015-04-20 VITALS — BP 143/76 | HR 66 | Temp 97.0°F | Ht 64.0 in | Wt 155.0 lb

## 2015-04-20 DIAGNOSIS — E785 Hyperlipidemia, unspecified: Secondary | ICD-10-CM

## 2015-04-20 DIAGNOSIS — I10 Essential (primary) hypertension: Secondary | ICD-10-CM | POA: Diagnosis not present

## 2015-04-20 DIAGNOSIS — I69351 Hemiplegia and hemiparesis following cerebral infarction affecting right dominant side: Secondary | ICD-10-CM | POA: Diagnosis not present

## 2015-04-20 NOTE — Progress Notes (Signed)
Subjective:  Patient ID: Marilyn Ford, female    DOB: [birth year]  Age: 54 y.o. MRN: [id number]  CC: Hypertension and Cerebrovascular Accident   HPI Marilyn Ford presents for follow-up of her recent CVA. She has had weakness in the right upper and lower extremity. She's been going to physical therapy regularly. She is regaining significant strength and coordination through her therapy. She is now walking without a walker. She has a bit of a limp and mild weakness on the right. Today she is significant again for reassessment for her physical therapy, work readiness and for reevaluation of her risk factors including blood pressure and cholesterol. She continues to use aspirin and Plavix therapy to prevent thromboembolism. At this time she states she is tolerating her medicines well. She denies myalgia from the atorvastatin. Blood pressure checks at home have been stable toward the upper end of the normal range.  History Marilyn Ford has a past medical history of Hypertension; Diabetes mellitus without complication ([place]); and Stroke ([place]).   She has past surgical history that includes Cholecystectomy and Cesarean section.   Her family history includes Diabetes in her brother and mother.She reports that she quit smoking about 5 weeks ago. Her smoking use included Cigarettes. She started smoking about 15 years ago. She smoked 0.50 packs per day. She does not have any smokeless tobacco history on file. She reports that she does not drink alcohol or use illicit drugs.  Outpatient Prescriptions Prior to Visit  Medication Sig Dispense Refill  . amLODipine (NORVASC) 10 MG tablet Take 1 tablet (10 mg total) by mouth daily. For blood pressure 30 tablet 2  . aspirin EC 81 MG EC tablet Take 1 tablet (81 mg total) by mouth daily. 30 tablet 0  . atorvastatin (LIPITOR) 80 MG tablet Take 1 tablet (80 mg total) by mouth daily at 6 PM. 30 tablet 5  . clopidogrel (PLAVIX) 75 MG tablet Take 1 tablet (75 mg total)  by mouth daily. 30 tablet 3  . metFORMIN (GLUCOPHAGE-XR) 500 MG 24 hr tablet Take 1 tablet (500 mg total) by mouth daily with breakfast. 90 tablet 3  . metoprolol succinate (TOPROL-XL) 50 MG 24 hr tablet Take 1 tablet (50 mg total) by mouth daily. Take with or immediately following a meal. 30 tablet 0  . Multiple Vitamin (MULTIVITAMIN) capsule Take 1 capsule by mouth daily.    . traZODone (DESYREL) 150 MG tablet 1 or 2 at bedtime for sleep 60 tablet 2  . valsartan-hydrochlorothiazide (DIOVAN-HCT) 320-25 MG tablet Take 1 tablet by mouth daily. 30 tablet 2   No facility-administered medications prior to visit.    ROS Review of Systems  Constitutional: Negative for fever, activity change and appetite change.  HENT: Negative for congestion, rhinorrhea and sore throat.   Eyes: Negative for visual disturbance.  Respiratory: Negative for cough and shortness of breath.   Cardiovascular: Negative for chest pain and palpitations.  Gastrointestinal: Negative for nausea, abdominal pain and diarrhea.  Genitourinary: Negative for dysuria.  Musculoskeletal: Negative for myalgias and arthralgias.  Neurological: Positive for weakness (left-sided, see history of present illness).  Psychiatric/Behavioral: Negative for behavioral problems, confusion, dysphoric mood, decreased concentration and agitation. The patient is not nervous/anxious.     Objective:  BP 143/76 mmHg  Pulse 66  Temp(Src) 97 F (36.1 C) (Oral)  Ht 5\' 4"  (1.626 m)  Wt 155 lb (70.308 kg)  BMI 26.59 kg/m2  SpO2 99%  BP Readings from Last 3 Encounters:  04/20/15 143/76  04/14/15 135/80  04/13/15 112/71    Wt Readings from Last 3 Encounters:  04/20/15 155 lb (70.308 kg)  04/05/15 156 lb (70.761 kg)  03/30/15 158 lb 6.4 oz (71.85 kg)     Physical Exam  Constitutional: She is oriented to person, place, and time. She appears well-developed and well-nourished. No distress.  HENT:  Head: Normocephalic and atraumatic.  Right  Ear: External ear normal.  Left Ear: External ear normal.  Nose: Nose normal.  Mouth/Throat: Oropharynx is clear and moist.  Eyes: Conjunctivae and EOM are normal. Pupils are equal, round, and reactive to light.  Neck: Normal range of motion. Neck supple. No thyromegaly present.  Cardiovascular: Normal rate, regular rhythm and normal heart sounds.   No murmur heard. Pulmonary/Chest: Effort normal and breath sounds normal. No respiratory distress. She has no wheezes. She has no rales.  Abdominal: Soft. Bowel sounds are normal. She exhibits no distension. There is no tenderness.  Lymphadenopathy:    She has no cervical adenopathy.  Neurological: She is alert and oriented to person, place, and time. She has normal reflexes. Coordination (Right sided weakness with 3-4/5 strength) abnormal.  Skin: Skin is warm and dry.  Psychiatric: She has a normal mood and affect. Her behavior is normal. Judgment and thought content normal.     Lab Results  Component Value Date   WBC 8.5 03/13/2015   HGB 15.7* 03/13/2015   HCT 43.9 03/13/2015   PLT 264 03/13/2015   GLUCOSE 177* 03/12/2015   CHOL 302* 03/13/2015   TRIG 534* 03/13/2015   HDL 39* 03/13/2015   LDLCALC UNABLE TO CALCULATE IF TRIGLYCERIDE OVER 400 mg/dL 03/13/2015   ALT 24 03/12/2015   AST 26 03/12/2015   NA 138 03/12/2015   K 3.8 03/12/2015   CL 97* 03/12/2015   CREATININE 0.85 03/12/2015   BUN 20 03/12/2015   CO2 23 03/12/2015   HGBA1C 6.8* 03/13/2015    Dg Chest 2 View  03/12/2015  CLINICAL DATA:  54 year old female with history of altered mental status and headache for 1 week. Blurred vision and weakness today. EXAM: CHEST  2 VIEW COMPARISON:  No priors. FINDINGS: Lung volumes are normal. No consolidative airspace disease. No pleural effusions. No pneumothorax. No pulmonary nodule or mass noted. Pulmonary vasculature and the cardiomediastinal silhouette are within normal limits. Atherosclerosis in the thoracic aorta. IMPRESSION:  1.  No radiographic evidence of acute cardiopulmonary disease. 2. Atherosclerosis. Electronically Signed   By: Vinnie Langton M.D.   On: 03/12/2015 10:05   Ct Head Wo Contrast  03/12/2015  CLINICAL DATA:  Right-sided weakness and numbness since yesterday. [place] out of bed this morning, now with confusion. EXAM: CT HEAD WITHOUT CONTRAST TECHNIQUE: Contiguous axial images were obtained from the base of the skull through the vertex without intravenous contrast. COMPARISON:  None. FINDINGS: Regional soft tissues appear normal. No radiopaque foreign body. No displaced calvarial fracture. Bilateral basal ganglial lacunar infarcts, right greater than left (representative (image 18, series 2). Bilateral basal ganglial calcifications. Scattered periventricular hypodensities compatible with microvascular ischemic disease. The gray-white differentiation is otherwise well maintained without CT evidence of superimposed acute large territory infarct. No intraparenchymal or extra-axial mass or hemorrhage. Unchanged size and configuration of the ventricles and basilar cisterns. No midline shift. There is diffuse increased attenuation intracranial blood pool suggestive of volume depletion. Limited visualization the paranasal sinuses and mastoid air cells is normal. No air-fluid levels. IMPRESSION: Advanced microvascular ischemic disease without definite superimposed acute intracranial process. Electronically Signed  By: Sandi Mariscal M.D.   On: 03/12/2015 10:30   Mr Brain Wo Contrast  03/12/2015  CLINICAL DATA:  54 year old female with hypertension, hyperglycemia. One week of headaches, progressed at 0300 hours with associated right extremity weakness and tingling at that time. Initial encounter. EXAM: MRI HEAD WITHOUT CONTRAST MRA HEAD WITHOUT CONTRAST TECHNIQUE: Multiplanar, multiecho pulse sequences of the brain and surrounding structures were obtained without intravenous contrast. Angiographic images of the head were  obtained using MRA technique without contrast. COMPARISON:  Head CT without contrast 1010 hours today. FINDINGS: MRI HEAD FINDINGS 16 mm curvilinear area of restricted diffusion tracking from the posterior left corona radiata through the external capsule toward the lentiform nuclei. Mild associated T2 and FLAIR hyperintensity. No associated acute hemorrhage or mass effect. Superimposed chronic lacunar infarcts, some with hemosiderin, in the right basal ganglia, bilateral thalami, and anterior limb of the left external capsule. Chronic lacunar infarct in the left pons. There are also superimposed small scattered white matter foci of restricted diffusion in the left occipital lobe best seen on series 4, image 17. Minimal associated T2 and FLAIR hyperintensity with no hemorrhage or mass effect. No right hemisphere or posterior fossa restricted diffusion. Major intracranial vascular flow voids are preserved. No cortical encephalomalacia. No other chronic cerebral blood products. No midline shift, mass effect, evidence of mass lesion, ventriculomegaly, extra-axial collection or acute intracranial hemorrhage. Cervicomedullary junction and pituitary are within normal limits. Negative visualized cervical spine. Visible internal auditory structures appear normal. Mastoids and paranasal sinuses are clear. Negative orbit and scalp soft tissues. Visualized bone marrow signal is within normal limits. MRA HEAD FINDINGS Antegrade flow in the posterior circulation with dominant distal left vertebral artery. Both PICA origins are patent. Patent vertebrobasilar junction. No basilar artery stenosis. SCA and PCA origins are normal. Right PCA branches are within normal limits. Posterior communicating arteries are diminutive or absent. There is a moderate to severe focal stenosis in the left PCA P2 segment (Series 6, image 79). Preserved distal left PCA flow. Antegrade flow in both ICA siphons. No siphon stenosis. Ophthalmic artery  origins are normal. Patent carotid termini. Dominant left ACA. ACA origins are normal. Diminutive anterior communicating artery. Visualized ACA branches are within normal limits. Right MCA M1 segment is mildly irregular without stenosis. Visualized MCA branches are within normal limits. Left MCA origin and M1 segment are patent. Mild M1 segment irregularity without stenosis. Patent left MCA bifurcation. No left MCA branch occlusion identified. IMPRESSION: 1. Acute lacunar infarct extending from the posterior left corona radiata to the external capsule. No hemorrhage or mass effect. 2. Superimposed scattered small acute left PCA territory white matter infarcts in the occipital lobe no hemorrhage or mass effect. Favor synchronous atherosclerotic/small vessel disease rather than sequelae of emboli. 3. Intracranial MRA remarkable for moderate to severe left PCA P2 segment stenosis. No anterior circulation stenosis or left MCA branch occlusion identified. 4. Underlying advanced chronic small vessel ischemia in the bilateral deep gray matter nuclei and brainstem. Electronically Signed   By: Genevie Ann M.D.   On: 03/12/2015 14:14   Mr Jodene Nam Head/brain Wo Cm  03/12/2015  CLINICAL DATA:  54 year old female with hypertension, hyperglycemia. One week of headaches, progressed at 0300 hours with associated right extremity weakness and tingling at that time. Initial encounter. EXAM: MRI HEAD WITHOUT CONTRAST MRA HEAD WITHOUT CONTRAST TECHNIQUE: Multiplanar, multiecho pulse sequences of the brain and surrounding structures were obtained without intravenous contrast. Angiographic images of the head were obtained using MRA technique without  contrast. COMPARISON:  Head CT without contrast 1010 hours today. FINDINGS: MRI HEAD FINDINGS 16 mm curvilinear area of restricted diffusion tracking from the posterior left corona radiata through the external capsule toward the lentiform nuclei. Mild associated T2 and FLAIR hyperintensity. No  associated acute hemorrhage or mass effect. Superimposed chronic lacunar infarcts, some with hemosiderin, in the right basal ganglia, bilateral thalami, and anterior limb of the left external capsule. Chronic lacunar infarct in the left pons. There are also superimposed small scattered white matter foci of restricted diffusion in the left occipital lobe best seen on series 4, image 17. Minimal associated T2 and FLAIR hyperintensity with no hemorrhage or mass effect. No right hemisphere or posterior fossa restricted diffusion. Major intracranial vascular flow voids are preserved. No cortical encephalomalacia. No other chronic cerebral blood products. No midline shift, mass effect, evidence of mass lesion, ventriculomegaly, extra-axial collection or acute intracranial hemorrhage. Cervicomedullary junction and pituitary are within normal limits. Negative visualized cervical spine. Visible internal auditory structures appear normal. Mastoids and paranasal sinuses are clear. Negative orbit and scalp soft tissues. Visualized bone marrow signal is within normal limits. MRA HEAD FINDINGS Antegrade flow in the posterior circulation with dominant distal left vertebral artery. Both PICA origins are patent. Patent vertebrobasilar junction. No basilar artery stenosis. SCA and PCA origins are normal. Right PCA branches are within normal limits. Posterior communicating arteries are diminutive or absent. There is a moderate to severe focal stenosis in the left PCA P2 segment (Series 6, image 79). Preserved distal left PCA flow. Antegrade flow in both ICA siphons. No siphon stenosis. Ophthalmic artery origins are normal. Patent carotid termini. Dominant left ACA. ACA origins are normal. Diminutive anterior communicating artery. Visualized ACA branches are within normal limits. Right MCA M1 segment is mildly irregular without stenosis. Visualized MCA branches are within normal limits. Left MCA origin and M1 segment are patent. Mild  M1 segment irregularity without stenosis. Patent left MCA bifurcation. No left MCA branch occlusion identified. IMPRESSION: 1. Acute lacunar infarct extending from the posterior left corona radiata to the external capsule. No hemorrhage or mass effect. 2. Superimposed scattered small acute left PCA territory white matter infarcts in the occipital lobe no hemorrhage or mass effect. Favor synchronous atherosclerotic/small vessel disease rather than sequelae of emboli. 3. Intracranial MRA remarkable for moderate to severe left PCA P2 segment stenosis. No anterior circulation stenosis or left MCA branch occlusion identified. 4. Underlying advanced chronic small vessel ischemia in the bilateral deep gray matter nuclei and brainstem. Electronically Signed   By: Genevie Ann M.D.   On: 03/12/2015 14:14    Assessment & Plan:   Marilyn Ford was seen today for hypertension and cerebrovascular accident.  Diagnoses and all orders for this visit:  Hemiparesis affecting right side as late effect of cerebrovascular accident (CVA) ([place])  Essential hypertension  HLD (hyperlipidemia)   I am having Ms. Sassano maintain her multivitamin, aspirin, traZODone, amLODipine, valsartan-hydrochlorothiazide, metoprolol succinate, metFORMIN, atorvastatin, and clopidogrel.  No orders of the defined types were placed in this encounter.     Follow-up: Return in about 1 month (around 05/21/2015).  Claretta Fraise, M.D.

## 2015-04-21 ENCOUNTER — Telehealth: Payer: Self-pay | Admitting: Family Medicine

## 2015-04-21 NOTE — Telephone Encounter (Signed)
Please write. I will sign. Thanks, WS

## 2015-04-22 ENCOUNTER — Ambulatory Visit: Payer: PRIVATE HEALTH INSURANCE | Admitting: Physical Therapy

## 2015-04-22 DIAGNOSIS — R531 Weakness: Secondary | ICD-10-CM

## 2015-04-22 DIAGNOSIS — I69351 Hemiplegia and hemiparesis following cerebral infarction affecting right dominant side: Secondary | ICD-10-CM | POA: Insufficient documentation

## 2015-04-22 DIAGNOSIS — R29898 Other symptoms and signs involving the musculoskeletal system: Secondary | ICD-10-CM

## 2015-04-22 DIAGNOSIS — M259 Joint disorder, unspecified: Secondary | ICD-10-CM | POA: Diagnosis not present

## 2015-04-22 DIAGNOSIS — R2681 Unsteadiness on feet: Secondary | ICD-10-CM

## 2015-04-22 NOTE — Therapy (Signed)
[facility] [place], [place], [address] Phone: [phone]   Fax:  [phone]  Physical Therapy Treatment  Patient Details  Name: Marilyn Ford MRN: [id number] Date of Birth: 01-Dec-1960 Referring Provider: Claretta Fraise MD  Encounter Date: 04/22/2015      PT End of Session - 04/22/15 1126    Visit Number 7   Number of Visits 16   Date for PT Re-Evaluation 05/31/15   PT Start Time 1118   PT Stop Time 1158   PT Time Calculation (min) 40 min   Activity Tolerance Patient tolerated treatment well   Behavior During Therapy [facility] for tasks assessed/performed      Past Medical History  Diagnosis Date  . Hypertension   . Diabetes mellitus without complication ([place])   . Stroke [facility])     Past Surgical History  Procedure Laterality Date  . Cholecystectomy    . Cesarean section      There were no vitals filed for this visit.  Visit Diagnosis:  Unsteadiness  Generalized weakness  Ankle weakness      Subjective Assessment - 04/22/15 1127    Subjective Patient went to see MD yesterday. She also reported that she almost fell on Wednesday when her cat ran in front of her. She caught herself on the door, but was not using her walker.   Patient Stated Goals learn how to walk and get stronger   Currently in Pain? No/denies                         [facility] Adult PT Treatment/Exercise - 04/22/15 0001    Knee/Hip Exercises: Aerobic   Nustep L6 x 13 min   Knee/Hip Exercises: Standing   Knee Flexion Strengthening;Right;2 sets;10 reps  5 second hold; 1x 5   Hip Abduction Both;10 reps;3 sets  2#   Lateral Step Up Right;2 sets;10 reps;Hand Hold: 2;Step Height: 8"   Forward Step Up Right;2 sets;10 reps;Hand Hold: 2;Step Height: 8"   Knee/Hip Exercises: Seated   Knee/Hip Flexion 3 sets to fatigue hip flexion with 2# weight    Ankle Exercises: Seated   Other Seated Ankle Exercises ankle isolator 2# DF x 30,  Inv 1.5# x 30, Ever  with foot on bolster/pillocase x 20   PT had to block knee to avoid hip rotation                     PT Long Term Goals - 04/19/15 1205    PT LONG TERM GOAL #1   Title Ind with a HEP.   Time 8   Period Weeks   Status Achieved   PT LONG TERM GOAL #2   Title Right ankle strength= 5/5.   Time 8   Period Weeks   Status On-going   PT LONG TERM GOAL #3   Title Walk in clinic with a straight cane without gait deviation.   Time 8   Period Weeks   Status On-going   PT LONG TERM GOAL #4   Title Perform all transfers independent.   Time 8   Period Weeks   Status Achieved               Plan - 04/22/15 1204    Clinical Impression Statement Patient continues to work hard. She has hip flexor weakness which is evident with seated hip flexion. She fatigues easily and compensates with backward trunk lean. Ankle eversion is difficult when  any resistance is added. Patient wants to compensate with DF and/or hip ER.   PT Next Visit Plan Work gait first; Continue RLE strength, including ankle DF/ever; HS, hip flex/abd/ext; core exercise; gait with SPC and balance exercises.        Problem List Patient Active Problem List   Diagnosis Date Noted  . HLD (hyperlipidemia)   . Right sided weakness 03/12/2015  . CVA (cerebral infarction) 03/12/2015  . Tobacco abuse 03/12/2015  . DM (diabetes mellitus), type 2, uncontrolled ([place]) 12/23/2014  . Hypertension 12/23/2014   [name] PT  04/22/2015, 12:08 PM  [facility] [place], [place], [address] Phone: [phone]   Fax:  [phone]  Name: Marilyn Ford MRN: [id number] Date of Birth: Mar 17, 1961

## 2015-04-24 ENCOUNTER — Other Ambulatory Visit: Payer: Self-pay | Admitting: Family Medicine

## 2015-04-27 ENCOUNTER — Ambulatory Visit: Payer: PRIVATE HEALTH INSURANCE | Admitting: *Deleted

## 2015-04-27 DIAGNOSIS — R2681 Unsteadiness on feet: Secondary | ICD-10-CM

## 2015-04-27 DIAGNOSIS — R29898 Other symptoms and signs involving the musculoskeletal system: Secondary | ICD-10-CM

## 2015-04-27 DIAGNOSIS — R531 Weakness: Secondary | ICD-10-CM

## 2015-04-27 DIAGNOSIS — M259 Joint disorder, unspecified: Secondary | ICD-10-CM | POA: Diagnosis not present

## 2015-04-27 NOTE — Therapy (Signed)
[facility] [place], [place], [address] Phone: [phone]   Fax:  [phone]  Physical Therapy Treatment  Patient Details  Name: SHERICE VATTER MRN: [id number] Date of Birth: 1961/01/29 Referring Provider: Claretta Fraise MD  Encounter Date: 04/27/2015      PT End of Session - 04/27/15 0958    Visit Number 8   Number of Visits 16   Date for PT Re-Evaluation 05/31/15   PT Start Time 0945   PT Stop Time [id number]   PT Time Calculation (min) 50 min      Past Medical History  Diagnosis Date  . Hypertension   . Diabetes mellitus without complication ([place])   . Stroke [facility])     Past Surgical History  Procedure Laterality Date  . Cholecystectomy    . Cesarean section      There were no vitals filed for this visit.  Visit Diagnosis:  Unsteadiness  Ankle weakness  Generalized weakness      Subjective Assessment - 04/27/15 0956    Subjective MD said I was doing better and wanted me to keep coming to PT   Patient Stated Goals learn how to walk and get stronger                         [facility] Adult PT Treatment/Exercise - 04/27/15 0001    Knee/Hip Exercises: Aerobic   Nustep L4-6 x 13 min   Knee/Hip Exercises: Standing   Heel Raises 3 sets;10 reps   Knee Flexion Strengthening;Right;2 sets;10 reps  5 second hold; 1x 5   Hip Abduction Both;10 reps;3 sets  2#   Lateral Step Up Right;2 sets;10 reps;Hand Hold: 2;Step Height: 8"   Forward Step Up Right;2 sets;10 reps;Hand Hold: 2;Step Height: 8"   Other Standing Knee Exercises one step holds with LT and then RT foot in front   Knee/Hip Exercises: Seated   Sit to Sand 10 reps;without UE support   Ankle Exercises: Seated   Other Seated Ankle Exercises dyna-disc DF/PF,EV/INV/ and circles each way 2x10 each                     PT Long Term Goals - 04/19/15 1205    PT LONG TERM GOAL #1   Title Ind with a HEP.   Time 8   Period Weeks    Status Achieved   PT LONG TERM GOAL #2   Title Right ankle strength= 5/5.   Time 8   Period Weeks   Status On-going   PT LONG TERM GOAL #3   Title Walk in clinic with a straight cane without gait deviation.   Time 8   Period Weeks   Status On-going   PT LONG TERM GOAL #4   Title Perform all transfers independent.   Time 8   Period Weeks   Status Achieved               Plan - 04/27/15 1238    Clinical Impression Statement Pt did fairly well today and was able to perform all exs and Act.'s.  She still fatigues fairly easy and needs to rest at times. She did well with dyna-disc, but was challenging for eversion and needed assistance to complete circles.    Pt will benefit from skilled therapeutic intervention in order to improve on the following deficits Pain;Decreased activity tolerance;Decreased strength   Rehab Potential Excellent   PT Frequency 2x / week  PT Duration 8 weeks   PT Treatment/Interventions Therapeutic activities;Therapeutic exercise;Gait training;Neuromuscular re-education;Balance training   PT Next Visit Plan Work gait first; Continue RLE strength, including ankle DF/ever; HS, hip flex/abd/ext; core exercise; gait with SPC and balance exercises.   Consulted and Agree with Plan of Care Patient        Problem List Patient Active Problem List   Diagnosis Date Noted  . Hemiparesis affecting right side as late effect of cerebrovascular accident (CVA) ([place]) 04/22/2015  . HLD (hyperlipidemia)   . DM (diabetes mellitus), type 2, uncontrolled ([place]) 12/23/2014  . Hypertension 12/23/2014    RAMSEUR,CHRIS, PTA 04/27/2015, 12:49 PM  [facility] [address] [place], [place], [address] Phone: [phone]   Fax:  [phone]  Name: KERRIN FRANEY MRN: [id number] Date of Birth: [birth year]

## 2015-04-28 ENCOUNTER — Encounter: Payer: Self-pay | Admitting: Physical Therapy

## 2015-04-28 ENCOUNTER — Ambulatory Visit: Payer: PRIVATE HEALTH INSURANCE | Admitting: Physical Therapy

## 2015-04-28 VITALS — BP 182/82 | HR 72

## 2015-04-28 DIAGNOSIS — R2681 Unsteadiness on feet: Secondary | ICD-10-CM

## 2015-04-28 DIAGNOSIS — R29898 Other symptoms and signs involving the musculoskeletal system: Secondary | ICD-10-CM

## 2015-04-28 DIAGNOSIS — M259 Joint disorder, unspecified: Secondary | ICD-10-CM | POA: Diagnosis not present

## 2015-04-28 DIAGNOSIS — R531 Weakness: Secondary | ICD-10-CM

## 2015-04-28 NOTE — Therapy (Signed)
[facility] [place], [place], [address] Phone: [phone]   Fax:  [phone]  Physical Therapy Treatment  Patient Details  Name: Marilyn Ford MRN: [id number] Date of Birth: Mar 04, 1961 Referring Angelisa Winthrop: Claretta Fraise MD  Encounter Date: 04/28/2015      PT End of Session - 04/28/15 0827    Visit Number 9   Number of Visits 16   Date for PT Re-Evaluation 05/31/15   PT Start Time 0817   PT Stop Time 0845  2 units secondary to patient leaving early for dentist appointment   PT Time Calculation (min) 28 min   Activity Tolerance Patient tolerated treatment well   Behavior During Therapy [facility] for tasks assessed/performed      Past Medical History  Diagnosis Date  . Hypertension   . Diabetes mellitus without complication ([place])   . Stroke [facility])     Past Surgical History  Procedure Laterality Date  . Cholecystectomy    . Cesarean section      Filed Vitals:   04/28/15 0831  BP: 182/82  Pulse: 72    Visit Diagnosis:  Unsteadiness  Ankle weakness  Generalized weakness      Subjective Assessment - 04/28/15 0821    Subjective States that she has to leave in 30 minutes due to a dentist appointment.   Patient Stated Goals learn how to walk and get stronger            [facility] PT Assessment - 04/28/15 0001    Assessment   Medical Diagnosis Cerebral infarction.   Onset Date/Surgical Date 03/12/15                     [facility] Adult PT Treatment/Exercise - 04/28/15 0001    Knee/Hip Exercises: Aerobic   Nustep L5 x12 min   Knee/Hip Exercises: Standing   Heel Raises Both;2 sets;10 reps   Heel Raises Limitations Toe raises 2x10 reps   Lateral Step Up Both;2 sets;10 reps;Hand Hold: 2;Step Height: 6"   Forward Step Up Both;2 sets;10 reps;Hand Hold: 2;Step Height: 6"   Knee/Hip Exercises: Seated   Sit to Sand 20 reps;without UE support   Ankle Exercises: Seated   Other Seated Ankle Exercises  AROM R ankle inversion/eversion x20 reps each                     PT Long Term Goals - 04/19/15 1205    PT LONG TERM GOAL #1   Title Ind with a HEP.   Time 8   Period Weeks   Status Achieved   PT LONG TERM GOAL #2   Title Right ankle strength= 5/5.   Time 8   Period Weeks   Status On-going   PT LONG TERM GOAL #3   Title Walk in clinic with a straight cane without gait deviation.   Time 8   Period Weeks   Status On-going   PT LONG TERM GOAL #4   Title Perform all transfers independent.   Time 8   Period Weeks   Status Achieved               Plan - 04/28/15 0847    Clinical Impression Statement Patient tolerated today's treatment well although she reports that her R ankle fatigues quickly. Repititions were cut short on exercises secondary to patient having to leave early for a dentist appointment. Patient noted that BP medication had not been taken today resulting in high reading today.  Completed exercises well although with seated AROM R ankle inversion/eversion fatigue was beginning to be noticable. Required mioderate multimodal cueing for correction of sequencing of step up actiiviites. Continues to report intermittant R knee hyperextension with activity and states that at home she is comfortable to walk without the walker.   Pt will benefit from skilled therapeutic intervention in order to improve on the following deficits Pain;Decreased activity tolerance;Decreased strength   Rehab Potential Excellent   PT Frequency 2x / week   PT Duration 8 weeks   PT Treatment/Interventions Therapeutic activities;Therapeutic exercise;Gait training;Neuromuscular re-education;Balance training   PT Next Visit Plan Work gait first; Continue RLE strength, including ankle DF/ever; HS, hip flex/abd/ext; core exercise; gait with SPC and balance exercises.   Consulted and Agree with Plan of Care Patient        Problem List Patient Active Problem List   Diagnosis Date Noted   . Hemiparesis affecting right side as late effect of cerebrovascular accident (CVA) ([place]) 04/22/2015  . HLD (hyperlipidemia)   . DM (diabetes mellitus), type 2, uncontrolled ([place]) 12/23/2014  . Hypertension 12/23/2014    Wynelle Fanny, PTA 04/28/2015, 8:54 AM  [facility] [address] [place], [place], [address] Phone: [phone]   Fax:  [phone]  Name: Marilyn Ford MRN: [id number] Date of Birth: 08/15/1960

## 2015-04-29 ENCOUNTER — Telehealth: Payer: Self-pay | Admitting: Family Medicine
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
## 2015-05-04 ENCOUNTER — Encounter: Payer: PRIVATE HEALTH INSURANCE | Admitting: Physical Therapy

## 2015-05-06 ENCOUNTER — Telehealth: Payer: Self-pay | Admitting: Family Medicine

## 2015-05-06 ENCOUNTER — Encounter: Payer: PRIVATE HEALTH INSURANCE | Admitting: *Deleted

## 2015-05-06 NOTE — Telephone Encounter (Signed)
Pt wanted to know what she could take otc for cough. Pt advised to take delsym or robitussion otw and call us back if it does not work.

## 2015-05-11 ENCOUNTER — Ambulatory Visit: Payer: PRIVATE HEALTH INSURANCE | Attending: Family Medicine | Admitting: Physical Therapy

## 2015-05-11 DIAGNOSIS — R2681 Unsteadiness on feet: Secondary | ICD-10-CM | POA: Diagnosis present

## 2015-05-11 DIAGNOSIS — R29898 Other symptoms and signs involving the musculoskeletal system: Secondary | ICD-10-CM

## 2015-05-11 DIAGNOSIS — R531 Weakness: Secondary | ICD-10-CM | POA: Diagnosis present

## 2015-05-11 DIAGNOSIS — M259 Joint disorder, unspecified: Secondary | ICD-10-CM | POA: Insufficient documentation

## 2015-05-11 NOTE — Therapy (Signed)
[facility] [place], [place], [address] Phone: [phone]   Fax:  [phone]  Physical Therapy Treatment  Patient Details  Name: ELEXCIA ZETTLEMOYER MRN: [id number] Date of Birth: [birth year] Referring Provider: Claretta Fraise MD  Encounter Date: 05/11/2015      PT End of Session - 05/11/15 1352    Visit Number 10   Number of Visits 16   Date for PT Re-Evaluation 05/31/15   PT Start Time 1350   PT Stop Time 1430   PT Time Calculation (min) 40 min   Activity Tolerance Patient tolerated treatment well   Behavior During Therapy [facility] for tasks assessed/performed      Past Medical History  Diagnosis Date  . Hypertension   . Diabetes mellitus without complication ([place])   . Stroke [facility])     Past Surgical History  Procedure Laterality Date  . Cholecystectomy    . Cesarean section      There were no vitals filed for this visit.  Visit Diagnosis:  Unsteadiness  Generalized weakness  Ankle weakness      Subjective Assessment - 05/11/15 1353    Subjective Patient presents with no new complaints. She amb from lobby to clinic without AD, but used it from car to lobby. She states she now goes up and down stairs to do her laundry using the railing. She uses a reciprocal gait as well.   Patient Stated Goals learn how to walk and get stronger   Currently in Pain? No/denies            [facility] PT Assessment - 05/11/15 0001    Assessment   Medical Diagnosis Cerebral infarction.   Onset Date/Surgical Date 03/12/15   Strength   Overall Strength Comments R ankle DF 5-/5, ever 4+/5, Inv 5-//5                     [facility] Adult PT Treatment/Exercise - 05/11/15 0001    Knee/Hip Exercises: Aerobic   Nustep L6 x10 min   Knee/Hip Exercises: Standing   Heel Raises Both;2 sets;10 reps  on foam   Heel Raises Limitations Toe raises 2x10 reps   Lateral Step Up Both;2 sets;10 reps;Hand Hold: 2;Step Height: 8"   Forward Step Up Both;2 sets;10 reps;Hand Hold: 2;Step Height: 8"   Rocker Board 2 minutes   Other Standing Knee Exercises side stepping on balance beam one UE support, standing on foam eyes closed   Knee/Hip Exercises: Seated   Sit to Sand 10 reps;without UE support  1 set without wt; 1 set with 2 and 4 # ball (30 total)   Ankle Exercises: Seated   Other Seated Ankle Exercises ankle isolator seated 1.5# DF x 30                     PT Long Term Goals - 05/11/15 1554    PT LONG TERM GOAL #1   Title Ind with a HEP.   Time 8   Period Weeks   Status Achieved   PT LONG TERM GOAL #2   Title Right ankle strength= 5/5.   Time 8   Status On-going   PT LONG TERM GOAL #3   Title Walk in clinic with a straight cane without gait deviation.   Time 8   Period Weeks   Status On-going   PT LONG TERM GOAL #4   Title Perform all transfers independent.   Time 8   Period Weeks  Status Achieved               Plan - 05/11/15 1552    Clinical Impression Statement Patient continues to make gains in ankle strength and with overall strength and function. She is able to access her laundry room in basement now safely. She reports she only uses walker when she starts to feel weak and no longer uses it at night to go to the bathroom.    Pt will benefit from skilled therapeutic intervention in order to improve on the following deficits Pain;Decreased activity tolerance;Decreased strength   Rehab Potential Excellent   PT Frequency 2x / week   PT Duration 8 weeks   PT Treatment/Interventions Therapeutic activities;Therapeutic exercise;Gait training;Neuromuscular re-education;Balance training   PT Next Visit Plan Work gait first; Continue RLE strength, including ankle DF/ever; HS, hip flex/abd/ext; core exercise; gait with SPC and balance exercises.        Problem List Patient Active Problem List   Diagnosis Date Noted  . Hemiparesis affecting right side as late effect of  cerebrovascular accident (CVA) ([place]) 04/22/2015  . HLD (hyperlipidemia)   . DM (diabetes mellitus), type 2, uncontrolled ([place]) 12/23/2014  . Hypertension 12/23/2014   [name] PT  05/11/2015, 3:56 PM  [facility] [place], [place], [address] Phone: [phone]   Fax:  [phone]  Name: KASINDA KURTZER MRN: [id number] Date of Birth: [birth year]

## 2015-05-12 ENCOUNTER — Encounter: Payer: Self-pay | Admitting: Physical Therapy

## 2015-05-12 ENCOUNTER — Ambulatory Visit: Payer: PRIVATE HEALTH INSURANCE | Admitting: Physical Therapy

## 2015-05-12 DIAGNOSIS — R2681 Unsteadiness on feet: Secondary | ICD-10-CM

## 2015-05-12 DIAGNOSIS — R29898 Other symptoms and signs involving the musculoskeletal system: Secondary | ICD-10-CM

## 2015-05-12 DIAGNOSIS — R531 Weakness: Secondary | ICD-10-CM

## 2015-05-12 NOTE — Therapy (Signed)
[facility] [place], [place], [address] Phone: [phone]   Fax:  [phone]  Physical Therapy Treatment  Patient Details  Name: Marilyn Ford MRN: [id number] Date of Birth: [birth year] Referring Provider: Claretta Fraise MD  Encounter Date: 05/12/2015      PT End of Session - 05/12/15 1120    Visit Number 11   Number of Visits 16   Date for PT Re-Evaluation 05/31/15   PT Start Time 1118   PT Stop Time 1200   PT Time Calculation (min) 42 min   Activity Tolerance Patient tolerated treatment well   Behavior During Therapy [facility] for tasks assessed/performed      Past Medical History  Diagnosis Date  . Hypertension   . Diabetes mellitus without complication ([place])   . Stroke [facility])     Past Surgical History  Procedure Laterality Date  . Cholecystectomy    . Cesarean section      There were no vitals filed for this visit.  Visit Diagnosis:  Unsteadiness  Generalized weakness  Ankle weakness      Subjective Assessment - 05/12/15 1120    Subjective No new complaints today.   Patient Stated Goals learn how to walk and get stronger   Currently in Pain? No/denies            [facility] PT Assessment - 05/12/15 0001    Assessment   Medical Diagnosis Cerebral infarction.   Onset Date/Surgical Date 03/12/15                     [facility] Adult PT Treatment/Exercise - 05/12/15 0001    Ambulation/Gait   Ambulation/Gait Yes   Ambulation/Gait Assistance 6: Modified independent (Device/Increase time)   Ambulation Distance (Feet) 270 Feet   Assistive device None   Gait Pattern Step-through pattern;Decreased step length - right;Decreased step length - left;Decreased stance time - right;Decreased stride length;Decreased hip/knee flexion - right;Decreased dorsiflexion - right;Right foot flat;Decreased trunk rotation;Narrow base of support   Ambulation Surface Level;Indoor   Knee/Hip Exercises: Aerobic   Nustep L5-6 x15 min   Knee/Hip Exercises: Standing   Lateral Step Up Right;3 sets;10 reps;Hand Hold: 2;Step Height: 8"   Forward Step Up Right;3 sets;10 reps;Hand Hold: 2;Step Height: 8"   Other Standing Knee Exercises Sidestepping on beam 1 UE support x4 RT, toes on foam x3 min, tandem stance on airex x1 min   Knee/Hip Exercises: Seated   Long Arc Quad Strengthening;Right;2 sets;10 reps;Weights   Long Arc Quad Weight 3 lbs.   Sit to Sand 20 reps;without UE support  with 4# ball                     PT Long Term Goals - 05/11/15 1554    PT LONG TERM GOAL #1   Title Ind with a HEP.   Time 8   Period Weeks   Status Achieved   PT LONG TERM GOAL #2   Title Right ankle strength= 5/5.   Time 8   Status On-going   PT LONG TERM GOAL #3   Title Walk in clinic with a straight cane without gait deviation.   Time 8   Period Weeks   Status On-going   PT LONG TERM GOAL #4   Title Perform all transfers independent.   Time 8   Period Weeks   Status Achieved               Plan - 05/12/15 1209  Clinical Impression Statement Patient tolerated today's treatment well overall with only fatigue experienced at the end of treatment. Patient ambulated fairly well without AD required no CGA from PTA. Main focus with gait training was R heel/ toe to promote R knee control and encouraged patient to continue that focus at home. Patient experiences R hyperextension at times and keeps walker close for safety and comfort. No pain or discomfort reported with any therapeutic exercises today by patient. Patient tolerated balance exercises fairly well although she was experiencing fatigue with those exercises.    Pt will benefit from skilled therapeutic intervention in order to improve on the following deficits Pain;Decreased activity tolerance;Decreased strength   Rehab Potential Excellent   PT Frequency 2x / week   PT Duration 8 weeks   PT Treatment/Interventions Therapeutic  activities;Therapeutic exercise;Gait training;Neuromuscular re-education;Balance training   PT Next Visit Plan Work gait first; Continue RLE strength, including ankle DF/ever; HS, hip flex/abd/ext; core exercise; gait with SPC and balance exercises.   Consulted and Agree with Plan of Care Patient        Problem List Patient Active Problem List   Diagnosis Date Noted  . Hemiparesis affecting right side as late effect of cerebrovascular accident (CVA) ([place]) 04/22/2015  . HLD (hyperlipidemia)   . DM (diabetes mellitus), type 2, uncontrolled ([place]) 12/23/2014  . Hypertension 12/23/2014    Wynelle Fanny, PTA 05/12/2015, 12:16 PM  [facility] [address] [place], [place], [address] Phone: [phone]   Fax:  [phone]  Name: Marilyn Ford MRN: [id number] Date of Birth: [birth year]

## 2015-05-13 ENCOUNTER — Encounter: Payer: PRIVATE HEALTH INSURANCE | Admitting: Physical Therapy

## 2015-05-16 ENCOUNTER — Encounter: Payer: PRIVATE HEALTH INSURANCE | Admitting: Physical Therapy

## 2015-05-17 ENCOUNTER — Ambulatory Visit: Payer: PRIVATE HEALTH INSURANCE | Admitting: Physical Therapy

## 2015-05-17 DIAGNOSIS — R2681 Unsteadiness on feet: Secondary | ICD-10-CM | POA: Diagnosis not present

## 2015-05-17 DIAGNOSIS — R29898 Other symptoms and signs involving the musculoskeletal system: Secondary | ICD-10-CM

## 2015-05-17 DIAGNOSIS — R531 Weakness: Secondary | ICD-10-CM

## 2015-05-17 NOTE — Therapy (Signed)
[facility] [place], [place], [address] Phone: [phone]   Fax:  [phone]  Physical Therapy Treatment  Patient Details  Name: Marilyn Ford MRN: [id number] Date of Birth: [birth year] Referring Provider: Claretta Fraise MD  Encounter Date: 05/17/2015      PT End of Session - 05/17/15 1120    Visit Number 12   Number of Visits 16   Date for PT Re-Evaluation 05/31/15   PT Start Time 1118   PT Stop Time 1200   PT Time Calculation (min) 42 min   Activity Tolerance Patient tolerated treatment well   Behavior During Therapy [facility] for tasks assessed/performed      Past Medical History  Diagnosis Date  . Hypertension   . Diabetes mellitus without complication ([place])   . Stroke [facility])     Past Surgical History  Procedure Laterality Date  . Cholecystectomy    . Cesarean section      There were no vitals filed for this visit.  Visit Diagnosis:  Unsteadiness  Generalized weakness  Ankle weakness      Subjective Assessment - 05/17/15 1120    Subjective Paitent states she only has 30 min for appointment today. She also reports she has done a lot this morning going up and down stairs multiple times.   Patient Stated Goals learn how to walk and get stronger   Currently in Pain? No/denies            [facility] PT Assessment - 05/17/15 0001    Assessment   Medical Diagnosis Cerebral infarction.   Onset Date/Surgical Date 03/12/15   Next MD Visit 06/21/15   Strength   Overall Strength Comments R hip ABD 4-/5, R knee flex 4/5                     [facility] Adult PT Treatment/Exercise - 05/17/15 0001    Knee/Hip Exercises: Aerobic   Nustep L5 x 6   Knee/Hip Exercises: Standing   Knee Flexion Strengthening;Right;2 sets;10 reps   Knee/Hip Exercises: Seated   Long Arc Quad Strengthening;Right;2 sets;10 reps;Weights  2 sec hold and slow release   Long Arc Quad Weight 3 lbs.   Knee/Hip Exercises: Sidelying    Hip ABduction Strengthening;Right;10 reps;2 sets   Clams x25, then 10 with red band   Ankle Exercises: Seated   Heel Raises 15 reps  held because pt doing Blue    Other Seated Ankle Exercises ankle isolator seated 1.5# DF ever and inv  x 30  3 x 10   Ankle Exercises: Standing   SLS with intermittent UE support  still very difficult   Rocker Board 1 minute   Balance Beam heels up on beam with alternating arm reaches straight and diagonals                PT Education - 05/17/15 1207    Education provided Yes   Education Details HeP   Person(s) Educated Patient   Methods Explanation;Demonstration   Comprehension Verbalized understanding;Returned demonstration             PT Long Term Goals - 05/11/15 1554    PT LONG TERM GOAL #1   Title Ind with a HEP.   Time 8   Period Weeks   Status Achieved   PT LONG TERM GOAL #2   Title Right ankle strength= 5/5.   Time 8   Status On-going   PT LONG TERM GOAL #3   Title  Walk in clinic with a straight cane without gait deviation.   Time 8   Period Weeks   Status On-going   PT LONG TERM GOAL #4   Title Perform all transfers independent.   Time 8   Period Weeks   Status Achieved               Plan - 05/17/15 1207    Clinical Impression Statement Patient continues to make progress with balance and strength. She has most difficulty with COG shifted forward, but improved with repetitions. Her R hip ABD is still very weak. She demo'd good heel/toe with gait today without VCs. She did state that she is back to using the walker at night when she gets up because she has been feeling a little unsteady.    Pt will benefit from skilled therapeutic intervention in order to improve on the following deficits Pain;Decreased activity tolerance;Decreased strength   Rehab Potential Excellent   PT Frequency 2x / week   PT Duration 8 weeks   PT Treatment/Interventions Therapeutic activities;Therapeutic exercise;Gait  training;Neuromuscular re-education;Balance training   PT Next Visit Plan Continue R hip ABD strengthening in sidelying, ankle strengthening, HS, hip flex/ext and gait.   PT Home Exercise Plan clams with red band and SDLY hip ABD   Consulted and Agree with Plan of Care Patient        Problem List Patient Active Problem List   Diagnosis Date Noted  . Hemiparesis affecting right side as late effect of cerebrovascular accident (CVA) ([place]) 04/22/2015  . HLD (hyperlipidemia)   . DM (diabetes mellitus), type 2, uncontrolled ([place]) 12/23/2014  . Hypertension 12/23/2014    [name] PT  05/17/2015, 12:13 PM  [facility] [address] [place], [place], [address] Phone: [phone]   Fax:  [phone]  Name: Marilyn Ford MRN: [id number] Date of Birth: 1960-06-14

## 2015-05-20 ENCOUNTER — Ambulatory Visit: Payer: PRIVATE HEALTH INSURANCE | Admitting: Physical Therapy

## 2015-05-20 VITALS — BP 154/88

## 2015-05-20 DIAGNOSIS — R2681 Unsteadiness on feet: Secondary | ICD-10-CM | POA: Diagnosis not present

## 2015-05-20 DIAGNOSIS — R531 Weakness: Secondary | ICD-10-CM

## 2015-05-20 DIAGNOSIS — R29898 Other symptoms and signs involving the musculoskeletal system: Secondary | ICD-10-CM

## 2015-05-20 NOTE — Therapy (Signed)
[facility] [place], [place], [address] Phone: [phone]   Fax:  [phone]  Physical Therapy Treatment  Patient Details  Name: Marilyn Ford MRN: [id number] Date of Birth: 1960-08-12 Referring Provider: Claretta Fraise MD  Encounter Date: 05/20/2015      PT End of Session - 05/20/15 1354    Visit Number 13   Number of Visits 16   Date for PT Re-Evaluation 05/31/15   PT Start Time 0945   PT Stop Time [id number]   PT Time Calculation (min) 47 min      Past Medical History  Diagnosis Date  . Hypertension   . Diabetes mellitus without complication ([place])   . Stroke [facility])     Past Surgical History  Procedure Laterality Date  . Cholecystectomy    . Cesarean section      Filed Vitals:   05/20/15 1401  BP: 154/88    Visit Diagnosis:  Unsteadiness  Generalized weakness  Ankle weakness      Subjective Assessment - 05/20/15 1356    Subjective I drove today with my mother as passenger.  No problems.  I am also doing household chores.   Patient Stated Goals learn how to walk and get stronger   Currently in Pain? No/denies                         [facility] Adult PT Treatment/Exercise - 05/20/15 0001    Knee/Hip Exercises: Aerobic   Nustep Level x 15 minutes.   Knee/Hip Exercises: Standing   Forward Step Up Step Height: 4"   Forward Step Up Limitations x 4 minutes.             Balance Exercises - 05/20/15 1402    Balance Exercises: Standing   Rebounder --  2# ball x 4 minutes.   Other Standing Exercises Airex pad standing x 5 minutes f/b inverted BOSU in parallel bars x 10 minutes.                PT Long Term Goals - 05/11/15 1554    PT LONG TERM GOAL #1   Title Ind with a HEP.   Time 8   Period Weeks   Status Achieved   PT LONG TERM GOAL #2   Title Right ankle strength= 5/5.   Time 8   Status On-going   PT LONG TERM GOAL #3   Title Walk in clinic with a straight cane  without gait deviation.   Time 8   Period Weeks   Status On-going   PT LONG TERM GOAL #4   Title Perform all transfers independent.   Time 8   Period Weeks   Status Achieved               Problem List Patient Active Problem List   Diagnosis Date Noted  . Hemiparesis affecting right side as late effect of cerebrovascular accident (CVA) ([place]) 04/22/2015  . HLD (hyperlipidemia)   . DM (diabetes mellitus), type 2, uncontrolled ([place]) 12/23/2014  . Hypertension 12/23/2014    Shizuo Biskup, [place] MPT 05/20/2015, 2:09 PM  [facility] [address] [place], [place], [address] Phone: [phone]   Fax:  [phone]  Name: Marilyn Ford MRN: [id number] Date of Birth: 28-Sep-1960

## 2015-05-24 ENCOUNTER — Telehealth: Payer: Self-pay

## 2015-05-24 ENCOUNTER — Other Ambulatory Visit: Payer: Self-pay | Admitting: Neurology

## 2015-05-24 ENCOUNTER — Encounter: Payer: Self-pay | Admitting: *Deleted

## 2015-05-24 ENCOUNTER — Ambulatory Visit: Payer: PRIVATE HEALTH INSURANCE | Admitting: *Deleted

## 2015-05-24 DIAGNOSIS — R531 Weakness: Secondary | ICD-10-CM

## 2015-05-24 DIAGNOSIS — R002 Palpitations: Secondary | ICD-10-CM

## 2015-05-24 DIAGNOSIS — R2681 Unsteadiness on feet: Secondary | ICD-10-CM | POA: Diagnosis not present

## 2015-05-24 DIAGNOSIS — R29898 Other symptoms and signs involving the musculoskeletal system: Secondary | ICD-10-CM

## 2015-05-24 NOTE — Progress Notes (Signed)
I ordered again. Please follow it up. Thanks.  Rosalin Hawking, MD PhD Stroke Neurology 05/24/2015 8:43 PM  Orders Placed This Encounter  Procedures  . Cardiac event monitor    Standing Status: Future     Number of Occurrences:      Standing Expiration Date: 05/24/2016    Scheduling Instructions:     Request cardionet setup. Thank you.    Order Specific Question:  Where should this test be performed?    Answer:  [facility]

## 2015-05-24 NOTE — Therapy (Signed)
[facility] [place], [place], [address] Phone: [phone]   Fax:  [phone]  Physical Therapy Treatment  Patient Details  Name: Marilyn Ford MRN: [id number] Date of Birth: Feb 01, 1961 Referring Provider: Claretta Fraise MD  Encounter Date: 05/24/2015      PT End of Session - 05/24/15 0820    Visit Number 14   Number of Visits 16   Date for PT Re-Evaluation 05/31/15   PT Start Time 0815   PT Stop Time 0901   PT Time Calculation (min) 46 min      Past Medical History  Diagnosis Date  . Hypertension   . Diabetes mellitus without complication ([place])   . Stroke [facility])     Past Surgical History  Procedure Laterality Date  . Cholecystectomy    . Cesarean section      There were no vitals filed for this visit.  Visit Diagnosis:  Unsteadiness  Generalized weakness  Ankle weakness      Subjective Assessment - 05/24/15 0818    Subjective Drove today by myself and did well.    Patient Stated Goals learn how to walk and get stronger   Currently in Pain? No/denies                         [facility] Adult PT Treatment/Exercise - 05/24/15 0001    Knee/Hip Exercises: Aerobic   Nustep L5 x12 min   Knee/Hip Exercises: Standing   Lateral Step Up Right;3 sets;10 reps;Hand Hold: 2;Step Height: 6"   Forward Step Up Step Height: 6";3 sets;10 reps;Hand Hold: 2   Rocker Board 3 minutes  calf stretching   Knee/Hip Exercises: Seated   Long Arc Quad Strengthening;Right;10 reps;Weights;3 sets  2 sec hold and slow release   Long Arc Quad Weight 3 lbs.   Knee/Hip Exercises: Sidelying   Hip ABduction Strengthening;Right;10 reps;3 sets             Balance Exercises - 05/24/15 0844    Balance Exercises: Standing   Rebounder --  2# ball x 4 minutes.   Other Standing Exercises one step holds x 2 mins with each leg forward, RT ankle dyna disc x 30 all motions                PT Long Term Goals  - 05/11/15 1554    PT LONG TERM GOAL #1   Title Ind with a HEP.   Time 8   Period Weeks   Status Achieved   PT LONG TERM GOAL #2   Title Right ankle strength= 5/5.   Time 8   Status On-going   PT LONG TERM GOAL #3   Title Walk in clinic with a straight cane without gait deviation.   Time 8   Period Weeks   Status On-going   PT LONG TERM GOAL #4   Title Perform all transfers independent.   Time 8   Period Weeks   Status Achieved               Plan - 05/24/15 [id number]    Clinical Impression Statement Pt continues to make progress and did well again with Exs and Act.'s for balance and stregthening. She was challenged the most by COG forward with heels propped on airex and performing rebounder. Her RT foot inverts and VCs are needed for her to correct. She also did  better with hip abduction today   Pt will benefit from skilled  therapeutic intervention in order to improve on the following deficits Pain;Decreased activity tolerance;Decreased strength   Rehab Potential Excellent   PT Frequency 2x / week   PT Duration 8 weeks   PT Treatment/Interventions Therapeutic activities;Therapeutic exercise;Gait training;Neuromuscular re-education;Balance training   PT Next Visit Plan Continue R hip ABD strengthening in sidelying, ankle strengthening, HS, hip flex/ext and gait.  MD note written?   Consulted and Agree with Plan of Care Patient        Problem List Patient Active Problem List   Diagnosis Date Noted  . Hemiparesis affecting right side as late effect of cerebrovascular accident (CVA) ([place]) 04/22/2015  . HLD (hyperlipidemia)   . DM (diabetes mellitus), type 2, uncontrolled ([place]) 12/23/2014  . Hypertension 12/23/2014    Clenton Esper,CHRIS, PTA 05/24/2015, 9:20 AM  [facility] [address] [place], [place], [address] Phone: [phone]   Fax:  [phone]  Name: ANAIZA AZBILL MRN: [id number] Date of Birth: [birth year]

## 2015-05-24 NOTE — Telephone Encounter (Signed)
Called and left vm to return call at [phone]. To sch research screening visit.

## 2015-05-25 ENCOUNTER — Telehealth: Payer: Self-pay | Admitting: Neurology

## 2015-05-25 NOTE — Telephone Encounter (Signed)
I returned a phone call from [name] [name]  I spoke with Marilyn Ford and let her know that my reason for calling was to see if she was interested in scheduling an appointment for a research study with Dr. Erlinda Hong.  She said that she needed more time to think about the study.  I let her know that my manager would like for me to schedule her for next week.  She stated that she still needed more time to think about the study and was not ready right now to make an appointment.  I told her that I would let my manager know.  She said "Thank you"

## 2015-05-27 ENCOUNTER — Ambulatory Visit: Payer: PRIVATE HEALTH INSURANCE | Admitting: *Deleted

## 2015-05-27 ENCOUNTER — Encounter: Payer: Self-pay | Admitting: *Deleted

## 2015-05-27 DIAGNOSIS — R29898 Other symptoms and signs involving the musculoskeletal system: Secondary | ICD-10-CM

## 2015-05-27 DIAGNOSIS — R531 Weakness: Secondary | ICD-10-CM

## 2015-05-27 DIAGNOSIS — R2681 Unsteadiness on feet: Secondary | ICD-10-CM | POA: Diagnosis not present

## 2015-05-27 NOTE — Therapy (Addendum)
[facility] [place], [place], [address] Phone: [phone]   Fax:  [phone]  Physical Therapy Treatment  Patient Details  Name: Marilyn Ford MRN: [id number] Date of Birth: 07/06/1960 Referring Provider: Claretta Fraise MD  Encounter Date: 05/27/2015      PT End of Session - 05/27/15 1040    Visit Number 15   Number of Visits 16   Date for PT Re-Evaluation 05/31/15   PT Start Time 0946   PT Stop Time 1037   PT Time Calculation (min) 51 min      Past Medical History  Diagnosis Date  . Hypertension   . Diabetes mellitus without complication ([place])   . Stroke [facility])     Past Surgical History  Procedure Laterality Date  . Cholecystectomy    . Cesarean section      There were no vitals filed for this visit.  Visit Diagnosis:  Unsteadiness  Generalized weakness  Ankle weakness      Subjective Assessment - 05/27/15 1001    Subjective RT hip was sore after last Rx   Patient Stated Goals learn how to walk and get stronger   Currently in Pain? No/denies                         [facility] Adult PT Treatment/Exercise - 05/27/15 0001    Knee/Hip Exercises: Aerobic   Nustep L5 x15 min   Knee/Hip Exercises: Standing   Lateral Step Up Right;3 sets;10 reps;Hand Hold: 2;Step Height: 6"   Forward Step Up Step Height: 6";3 sets;10 reps;Hand Hold: 2   Rocker Board 3 minutes  calf stretching and balance SBA   Knee/Hip Exercises: Seated   Long Arc Quad Strengthening;Right;10 reps;Weights;3 sets  2 sec hold and slow release   Long Arc Quad Weight 3 lbs.   Knee/Hip Exercises: Sidelying   Hip ABduction Strengthening;Right;10 reps;3 sets   Ankle Exercises: Standing   Other Standing Ankle Exercises dyna-disc avoid inversion             Balance Exercises - 05/27/15 1015    Balance Exercises: Standing   Rebounder --  2# ball x 4 minutes.   Rockerboard EO;EC  also diagonal reaches   Other  Standing Exercises one step holds x 2 mins with each leg forward, RT ankle dyna disc x 30 all motions. Airex pad reaching diagonals, EO/EC with SBA                 PT Long Term Goals - 05/27/15 1041    PT LONG TERM GOAL #1   Title Ind with a HEP.   Time 8   Period Weeks   Status Achieved   PT LONG TERM GOAL #2   Title Right ankle strength= 5/5.   Time 8   Period Weeks   Status On-going   PT LONG TERM GOAL #3   Title Walk in clinic with a straight cane without gait deviation.   Time 8   Period Weeks   Status On-going   PT LONG TERM GOAL #4   Title Perform all transfers independent.   Time 8   Period Weeks   Status Achieved               Plan - 05/27/15 1045    Clinical Impression Statement Pt continues to progress with some act.'s and exs, but still has strength ( eversion 4/5) and proprioception deficits in RT ankle and has not  met goals yet. During exs her RT ankle still inverts, but when cued she can correct it. She now has the awareness  when her ankle is doing this and  can correct it.  We have slowed her gait cycle down and  practiced heel to toe pattern without inversion of RT ankle.  2 LTGs are ongoing   Pt will benefit from skilled therapeutic intervention in order to improve on the following deficits Pain;Decreased activity tolerance;Decreased strength   Rehab Potential Excellent   PT Frequency 2x / week   PT Duration 8 weeks   PT Treatment/Interventions Therapeutic activities;Therapeutic exercise;Gait training;Neuromuscular re-education;Balance training   PT Next Visit Plan Continue R hip ABD strengthening in sidelying, ankle strengthening, HS, hip flex/ext and gait.        RT ankle proprioception       Needs MD NOTE TO CONTINUE. Routed to PT   PT Home Exercise Plan clams with red band and SDLY hip ABD   Consulted and Agree with Plan of Care Patient        Problem List Patient Active Problem List   Diagnosis Date Noted  . Palpitations 05/24/2015   . Hemiparesis affecting right side as late effect of cerebrovascular accident (CVA) ([place]) 04/22/2015  . HLD (hyperlipidemia)   . DM (diabetes mellitus), type 2, uncontrolled ([place]) 12/23/2014  . Hypertension 12/23/2014    APPLEGATE, Mali, PTA 05/27/2015, 12:53 PM Mali Applegate MPT [facility] [address] [place], [place], [address] Phone: [phone]   Fax:  [phone]  Name: Marilyn Ford MRN: [id number] Date of Birth: 10/25/1960

## 2015-05-31 ENCOUNTER — Other Ambulatory Visit: Payer: Self-pay | Admitting: Neurology

## 2015-05-31 ENCOUNTER — Ambulatory Visit (INDEPENDENT_AMBULATORY_CARE_PROVIDER_SITE_OTHER): Payer: PRIVATE HEALTH INSURANCE

## 2015-05-31 DIAGNOSIS — R002 Palpitations: Secondary | ICD-10-CM | POA: Diagnosis not present

## 2015-05-31 DIAGNOSIS — I639 Cerebral infarction, unspecified: Secondary | ICD-10-CM

## 2015-06-01 ENCOUNTER — Ambulatory Visit: Payer: PRIVATE HEALTH INSURANCE | Admitting: Physical Therapy

## 2015-06-01 ENCOUNTER — Encounter: Payer: Self-pay | Admitting: Physical Therapy

## 2015-06-01 DIAGNOSIS — R531 Weakness: Secondary | ICD-10-CM

## 2015-06-01 DIAGNOSIS — R2681 Unsteadiness on feet: Secondary | ICD-10-CM | POA: Diagnosis not present

## 2015-06-01 DIAGNOSIS — R29898 Other symptoms and signs involving the musculoskeletal system: Secondary | ICD-10-CM

## 2015-06-01 NOTE — Therapy (Signed)
[facility] [place], [place], [address] Phone: [phone]   Fax:  [phone]  Physical Therapy Treatment  Patient Details  Name: Marilyn Ford MRN: [id number] Date of Birth: 1961-03-11 Referring Provider: Claretta Fraise MD  Encounter Date: 06/01/2015      PT End of Session - 06/01/15 1024    Visit Number 16   Number of Visits 16   Date for PT Re-Evaluation 05/31/15   PT Start Time 0944   PT Stop Time 1028   PT Time Calculation (min) 44 min   Activity Tolerance Patient tolerated treatment well   Behavior During Therapy [facility] for tasks assessed/performed      Past Medical History  Diagnosis Date  . Hypertension   . Diabetes mellitus without complication ([place])   . Stroke [facility])     Past Surgical History  Procedure Laterality Date  . Cholecystectomy    . Cesarean section      There were no vitals filed for this visit.  Visit Diagnosis:  Unsteadiness  Generalized weakness  Ankle weakness      Subjective Assessment - 06/01/15 1018    Subjective Right hip feeling better today   Patient Stated Goals learn how to walk and get stronger   Currently in Pain? No/denies                         [facility] Adult PT Treatment/Exercise - 06/01/15 0001    Knee/Hip Exercises: Aerobic   Nustep L5 x15 min with UE/LE monitored activity   Knee/Hip Exercises: Standing   Lateral Step Up Right;3 sets;10 reps;Hand Hold: 2;Step Height: 6"   Forward Step Up Step Height: 6";3 sets;10 reps;Hand Hold: 2   Rocker Board 3 minutes   Knee/Hip Exercises: Seated   Long Arc Quad Strengthening;Right;10 reps;Weights;3 sets   [employer] Weight 3 lbs.   Marching Limitations 3x10   Marching Weights 3 lbs.   Sit to Sand 20 reps;without UE support   Knee/Hip Exercises: Sidelying   Hip ABduction Strengthening;Right;10 reps;3 sets                     PT Long Term Goals - 05/27/15 1041    PT LONG TERM GOAL  #1   Title Ind with a HEP.   Time 8   Period Weeks   Status Achieved   PT LONG TERM GOAL #2   Title Right ankle strength= 5/5.   Time 8   Period Weeks   Status On-going   PT LONG TERM GOAL #3   Title Walk in clinic with a straight cane without gait deviation.   Time 8   Period Weeks   Status On-going   PT LONG TERM GOAL #4   Title Perform all transfers independent.   Time 8   Period Weeks   Status Achieved               Plan - 06/01/15 1026    Clinical Impression Statement Patient tolerated treatment very well today and has less pain in right hip. Patient understands her limitations to avoid pain or fatigue. Patient reported feeling 70% better overall. goals ongoing dueto gait deviations at this time.   Pt will benefit from skilled therapeutic intervention in order to improve on the following deficits Pain;Decreased activity tolerance;Decreased strength   Rehab Potential Excellent   PT Frequency 2x / week   PT Duration 8 weeks   PT Treatment/Interventions Therapeutic  activities;Therapeutic exercise;Gait training;Neuromuscular re-education;Balance training   PT Next Visit Plan DC next visit per MPT   Consulted and Agree with Plan of Care Patient        Problem List Patient Active Problem List   Diagnosis Date Noted  . Palpitations 05/24/2015  . Hemiparesis affecting right side as late effect of cerebrovascular accident (CVA) ([place]) 04/22/2015  . HLD (hyperlipidemia)   . DM (diabetes mellitus), type 2, uncontrolled ([place]) 12/23/2014  . Hypertension 12/23/2014    Phillips Climes, PTA 06/01/2015, 10:32 AM  [facility] [place], [place], [address] Phone: [phone]   Fax:  [phone]  Name: JYOTI ZUPON MRN: [id number] Date of Birth: [birth year]

## 2015-06-03 ENCOUNTER — Ambulatory Visit: Payer: PRIVATE HEALTH INSURANCE | Admitting: *Deleted

## 2015-06-03 DIAGNOSIS — R2681 Unsteadiness on feet: Secondary | ICD-10-CM

## 2015-06-03 DIAGNOSIS — R531 Weakness: Secondary | ICD-10-CM

## 2015-06-03 DIAGNOSIS — R29898 Other symptoms and signs involving the musculoskeletal system: Secondary | ICD-10-CM

## 2015-06-03 NOTE — Therapy (Addendum)
[facility] [place], [place], [address] Phone: [phone]   Fax:  [phone]  Physical Therapy Treatment  Patient Details  Name: Marilyn Ford MRN: [id number] Date of Birth: 10/08/1960 Referring Provider: Claretta Fraise MD  Encounter Date: 06/03/2015      PT End of Session - 06/03/15 1052    Visit Number 17   Number of Visits 17   Date for PT Re-Evaluation 05/31/15   PT Start Time 0945   PT Stop Time 7544   PT Time Calculation (min) 54 min      Past Medical History  Diagnosis Date  . Hypertension   . Diabetes mellitus without complication ([place])   . Stroke [facility])     Past Surgical History  Procedure Laterality Date  . Cholecystectomy    . Cesarean section      There were no vitals filed for this visit.  Visit Diagnosis:  Unsteadiness  Generalized weakness  Ankle weakness      Subjective Assessment - 06/03/15 1003    Subjective Right hip feeling better today not as sore.    Patient Stated Goals learn how to walk and get stronger   Currently in Pain? No/denies                         [facility] Adult PT Treatment/Exercise - 06/03/15 0001    Knee/Hip Exercises: Aerobic   Stationary Bike x 5 mins   Nustep L5 x17 min with UE/LE monitored activity   Knee/Hip Exercises: Standing   Lateral Step Up Right;3 sets;10 reps;Hand Hold: 2;Step Height: 6"   Forward Step Up Step Height: 6";3 sets;10 reps;Hand Hold: 2   Rocker Board 3 minutes   Knee/Hip Exercises: Seated   Long Arc Quad Strengthening;Right;10 reps;Weights;3 sets   [employer] Weight 3 lbs.   Sit to Sand 20 reps;without UE support   Ankle Exercises: Standing   Rebounder 2# ball 3x 10 with step stride stance                     PT Long Term Goals - 06/03/15 1038    PT LONG TERM GOAL #1   Title Ind with a HEP.   Time 8   Period Weeks   Status Achieved   PT LONG TERM GOAL #2   Title Right ankle strength= 5/5.   Eversion4-/5   Time 8   Period Weeks   Status Partially Met   PT LONG TERM GOAL #3   Title Walk in clinic with a straight cane without gait deviation.  Pt still has a deviated gait   Time 8   Period Weeks   Status On-going   PT LONG TERM GOAL #4   Title Perform all transfers independent.   Time 8   Period Weeks   Status Achieved               Plan - 06/03/15 1053    Clinical Impression Statement Pt did very well today with Ex.'s and Act.'s for balance, proprioception and strengthening. She was unable to meet LTG for no gait deviation due to mild weakness still and she has the fear of RT knee hyper-extending on her. She also still has a strength  deficit in RT ankle for eversion so LTG for ankle strength was partially met.   Pt will benefit from skilled therapeutic intervention in order to improve on the following deficits Pain;Decreased activity tolerance;Decreased strength  Rehab Potential Excellent   PT Frequency 2x / week   PT Duration 8 weeks   PT Next Visit Plan Pt will F/U with MD 06-21-15. She is on Hold from PT until MD F/U. DC if no new MD order   PT Home Exercise Plan clams with red band and SDLY hip ABD   Consulted and Agree with Plan of Care Patient        Problem List Patient Active Problem List   Diagnosis Date Noted  . Palpitations 05/24/2015  . Hemiparesis affecting right side as late effect of cerebrovascular accident (CVA) ([place]) 04/22/2015  . HLD (hyperlipidemia)   . DM (diabetes mellitus), type 2, uncontrolled ([place]) 12/23/2014  . Hypertension 12/23/2014    Destry Bezdek,CHRIS, PTA 06/03/2015, 11:02 AM  [facility] [address] [place], [place], [address] Phone: [phone]   Fax:  [phone]  Name: Marilyn Ford MRN: [id number] Date of Birth: Jul 08, 1960  PHYSICAL THERAPY DISCHARGE SUMMARY  Visits from Start of Care: 17.  Current functional level related to goals / functional outcomes: See  above.   Remaining deficits: Continued right ankle weakness and gait deviation.   Education / Equipment: HEP. Plan: Patient agrees to discharge.  Patient goals were not met. Patient is being discharged due to not returning since the last visit.  ?????        Mali Applegate MPT

## 2015-06-09 ENCOUNTER — Telehealth: Payer: Self-pay | Admitting: Family Medicine

## 2015-06-10 ENCOUNTER — Telehealth: Payer: Self-pay | Admitting: Family Medicine

## 2015-06-10 NOTE — Telephone Encounter (Signed)
Pt needs a letter stating she is still under our care and that she has not been released to go back to work. Please advise

## 2015-06-10 NOTE — Telephone Encounter (Signed)
Letter has been printed, signed and is up front.

## 2015-06-10 NOTE — Telephone Encounter (Signed)
It was at [place] the whole time

## 2015-06-10 NOTE — Telephone Encounter (Signed)
Please write and I will sign 

## 2015-06-13 NOTE — Telephone Encounter (Signed)
Faxed letter, pt aware

## 2015-06-13 NOTE — Telephone Encounter (Signed)
Detailed message left for patient that letter is ready to be picked up.

## 2015-06-19 ENCOUNTER — Other Ambulatory Visit: Payer: Self-pay | Admitting: Family Medicine

## 2015-06-21 ENCOUNTER — Ambulatory Visit (INDEPENDENT_AMBULATORY_CARE_PROVIDER_SITE_OTHER): Payer: PRIVATE HEALTH INSURANCE | Admitting: Family Medicine

## 2015-06-21 ENCOUNTER — Encounter: Payer: Self-pay | Admitting: Family Medicine

## 2015-06-21 VITALS — BP 141/85 | HR 78 | Temp 97.8°F | Ht 64.0 in | Wt 151.2 lb

## 2015-06-21 DIAGNOSIS — I10 Essential (primary) hypertension: Secondary | ICD-10-CM | POA: Diagnosis not present

## 2015-06-21 DIAGNOSIS — I69351 Hemiplegia and hemiparesis following cerebral infarction affecting right dominant side: Secondary | ICD-10-CM

## 2015-06-21 DIAGNOSIS — IMO0002 Reserved for concepts with insufficient information to code with codable children: Secondary | ICD-10-CM

## 2015-06-21 DIAGNOSIS — E785 Hyperlipidemia, unspecified: Secondary | ICD-10-CM

## 2015-06-21 DIAGNOSIS — E1165 Type 2 diabetes mellitus with hyperglycemia: Secondary | ICD-10-CM | POA: Diagnosis not present

## 2015-06-21 DIAGNOSIS — E1159 Type 2 diabetes mellitus with other circulatory complications: Secondary | ICD-10-CM | POA: Diagnosis not present

## 2015-06-21 LAB — POCT GLYCOSYLATED HEMOGLOBIN (HGB A1C): HEMOGLOBIN A1C: 6.4

## 2015-06-21 MED ORDER — METFORMIN HCL ER (OSM) 1000 MG PO TB24: 1000.0000 mg | ORAL_TABLET | Freq: Every day | ORAL | Status: DC

## 2015-06-21 NOTE — Progress Notes (Signed)
Subjective:  Patient ID: Marilyn Ford, female    DOB: Feb 08, 1961  Age: 55 y.o. MRN: [id number]  CC: Cerebrovascular Accident and Hypertension   HPI Marilyn Ford presents for follow-up of her recent CVA. She has had weakness in the right upper and lower extremity. She's been going to physical therapy regularly. She is regaining significant strength and coordination through her therapy. She is now walking without a walker. She has a continued limp and weakness on the right. Today she isseen for reassessment for her physical therapy, work readiness and for reevaluation of her risk factors including blood pressure and cholesterol. She continues to use aspirin and Plavix therapy to prevent thromboembolism. At this time she states she is tolerating her medicines well. She denies myalgia from the atorvastatin. Blood pressure checks at home have been stable with systolic in the [id number].  Patient does  check blood sugar at home Log shows glucose fasting 110-30 until last month started going to 140-150. Patient denies symptoms such as polyuria, polydipsia, excessive hunger, nausea No significant hypoglycemic spells noted. Medications as noted below. Taking them regularly without complication/adverse reaction being reported today.   History Marilyn Ford has a past medical history of Hypertension; Diabetes mellitus without complication ([place]); and Stroke ([place]).   She has past surgical history that includes Cholecystectomy and Cesarean section.   Her family history includes Diabetes in her brother and mother.She reports that she quit smoking about 3 months ago. Her smoking use included Cigarettes. She started smoking about 15 years ago. She smoked 0.50 packs per day. She does not have any smokeless tobacco history on file. She reports that she does not drink alcohol or use illicit drugs.  Outpatient Prescriptions Prior to Visit  Medication Sig Dispense Refill  . amLODipine (NORVASC) 10 MG tablet TAKE ONE  TABLET BY MOUTH ONCE DAILY FOR BLOOD PRESSURE 30 tablet 3  . aspirin EC 81 MG EC tablet Take 1 tablet (81 mg total) by mouth daily. 30 tablet 0  . atorvastatin (LIPITOR) 80 MG tablet Take 1 tablet (80 mg total) by mouth daily at 6 PM. 30 tablet 5  . clopidogrel (PLAVIX) 75 MG tablet Take 1 tablet (75 mg total) by mouth daily. 30 tablet 3  . metFORMIN (GLUCOPHAGE-XR) 500 MG 24 hr tablet Take 1 tablet (500 mg total) by mouth daily with breakfast. 90 tablet 3  . metoprolol succinate (TOPROL-XL) 50 MG 24 hr tablet TAKE ONE TABLET BY MOUTH ONCE DAILY TAKE WITH OR IMMEDIATELY FOLLOWING A MEAL 30 tablet 5  . Multiple Vitamin (MULTIVITAMIN) capsule Take 1 capsule by mouth daily.    . traZODone (DESYREL) 150 MG tablet 1 or 2 at bedtime for sleep 60 tablet 2  . valsartan-hydrochlorothiazide (DIOVAN-HCT) 320-25 MG tablet TAKE ONE TABLET BY MOUTH ONCE DAILY 30 tablet 3   No facility-administered medications prior to visit.    ROS Review of Systems  Constitutional: Negative for fever, activity change and appetite change.  HENT: Negative for congestion, rhinorrhea and sore throat.   Eyes: Negative for visual disturbance.  Respiratory: Negative for cough and shortness of breath.   Cardiovascular: Negative for chest pain and palpitations.  Gastrointestinal: Negative for nausea, abdominal pain and diarrhea.  Genitourinary: Negative for dysuria.  Musculoskeletal: Negative for myalgias and arthralgias.  Neurological: Positive for weakness (left-sided, see history of present illness).  Psychiatric/Behavioral: Negative for behavioral problems, confusion, dysphoric mood, decreased concentration and agitation. The patient is not nervous/anxious.     Objective:  BP 141/85 mmHg  Pulse 78  Temp(Src) 97.8 F (36.6 C)  Ht 5\' 4"  (1.626 m)  Wt 151 lb 3.2 oz (68.584 kg)  BMI 25.94 kg/m2  SpO2 99%  BP Readings from Last 3 Encounters:  06/21/15 141/85  05/20/15 154/88  04/28/15 182/82    Wt Readings from  Last 3 Encounters:  06/21/15 151 lb 3.2 oz (68.584 kg)  04/20/15 155 lb (70.308 kg)  04/05/15 156 lb (70.761 kg)     Physical Exam  Constitutional: She is oriented to person, place, and time. She appears well-developed and well-nourished. No distress.  HENT:  Head: Normocephalic and atraumatic.  Right Ear: External ear normal.  Left Ear: External ear normal.  Nose: Nose normal.  Mouth/Throat: Oropharynx is clear and moist.  Eyes: Conjunctivae and EOM are normal. Pupils are equal, round, and reactive to light.  Neck: Normal range of motion. Neck supple. No thyromegaly present.  Cardiovascular: Normal rate, regular rhythm and normal heart sounds.   No murmur heard. Pulmonary/Chest: Effort normal and breath sounds normal. No respiratory distress. She has no wheezes. She has no rales.  Abdominal: Soft. Bowel sounds are normal. She exhibits no distension. There is no tenderness.  Lymphadenopathy:    She has no cervical adenopathy.  Neurological: She is alert and oriented to person, place, and time. She has normal reflexes. Coordination (Right sided weakness with 3-4/5 strength & moderate limp. Slow gait) abnormal.  Skin: Skin is warm and dry.  Psychiatric: She has a normal mood and affect. Her behavior is normal. Judgment and thought content normal.     Lab Results  Component Value Date   WBC 8.5 03/13/2015   HGB 15.7* 03/13/2015   HCT 43.9 03/13/2015   PLT 264 03/13/2015   GLUCOSE 177* 03/12/2015   CHOL 302* 03/13/2015   TRIG 534* 03/13/2015   HDL 39* 03/13/2015   LDLCALC UNABLE TO CALCULATE IF TRIGLYCERIDE OVER 400 mg/dL 03/13/2015   ALT 24 03/12/2015   AST 26 03/12/2015   NA 138 03/12/2015   K 3.8 03/12/2015   CL 97* 03/12/2015   CREATININE 0.85 03/12/2015   BUN 20 03/12/2015   CO2 23 03/12/2015   HGBA1C 6.8* 03/13/2015    Dg Chest 2 View  03/12/2015  CLINICAL DATA:  55 year old female with history of altered mental status and headache for 1 week. Blurred vision and  weakness today. EXAM: CHEST  2 VIEW COMPARISON:  No priors. FINDINGS: Lung volumes are normal. No consolidative airspace disease. No pleural effusions. No pneumothorax. No pulmonary nodule or mass noted. Pulmonary vasculature and the cardiomediastinal silhouette are within normal limits. Atherosclerosis in the thoracic aorta. IMPRESSION: 1.  No radiographic evidence of acute cardiopulmonary disease. 2. Atherosclerosis. Electronically Signed   By: Vinnie Langton M.D.   On: 03/12/2015 10:05   Ct Head Wo Contrast  03/12/2015  CLINICAL DATA:  Right-sided weakness and numbness since yesterday. [place] out of bed this morning, now with confusion. EXAM: CT HEAD WITHOUT CONTRAST TECHNIQUE: Contiguous axial images were obtained from the base of the skull through the vertex without intravenous contrast. COMPARISON:  None. FINDINGS: Regional soft tissues appear normal. No radiopaque foreign body. No displaced calvarial fracture. Bilateral basal ganglial lacunar infarcts, right greater than left (representative (image 18, series 2). Bilateral basal ganglial calcifications. Scattered periventricular hypodensities compatible with microvascular ischemic disease. The gray-white differentiation is otherwise well maintained without CT evidence of superimposed acute large territory infarct. No intraparenchymal or extra-axial mass or hemorrhage. Unchanged size and configuration of the ventricles and basilar  cisterns. No midline shift. There is diffuse increased attenuation intracranial blood pool suggestive of volume depletion. Limited visualization the paranasal sinuses and mastoid air cells is normal. No air-fluid levels. IMPRESSION: Advanced microvascular ischemic disease without definite superimposed acute intracranial process. Electronically Signed   By: Sandi Mariscal M.D.   On: 03/12/2015 10:30   Mr Brain Wo Contrast  03/12/2015  CLINICAL DATA:  55 year old female with hypertension, hyperglycemia. One week of headaches,  progressed at 0300 hours with associated right extremity weakness and tingling at that time. Initial encounter. EXAM: MRI HEAD WITHOUT CONTRAST MRA HEAD WITHOUT CONTRAST TECHNIQUE: Multiplanar, multiecho pulse sequences of the brain and surrounding structures were obtained without intravenous contrast. Angiographic images of the head were obtained using MRA technique without contrast. COMPARISON:  Head CT without contrast 1010 hours today. FINDINGS: MRI HEAD FINDINGS 16 mm curvilinear area of restricted diffusion tracking from the posterior left corona radiata through the external capsule toward the lentiform nuclei. Mild associated T2 and FLAIR hyperintensity. No associated acute hemorrhage or mass effect. Superimposed chronic lacunar infarcts, some with hemosiderin, in the right basal ganglia, bilateral thalami, and anterior limb of the left external capsule. Chronic lacunar infarct in the left pons. There are also superimposed small scattered white matter foci of restricted diffusion in the left occipital lobe best seen on series 4, image 17. Minimal associated T2 and FLAIR hyperintensity with no hemorrhage or mass effect. No right hemisphere or posterior fossa restricted diffusion. Major intracranial vascular flow voids are preserved. No cortical encephalomalacia. No other chronic cerebral blood products. No midline shift, mass effect, evidence of mass lesion, ventriculomegaly, extra-axial collection or acute intracranial hemorrhage. Cervicomedullary junction and pituitary are within normal limits. Negative visualized cervical spine. Visible internal auditory structures appear normal. Mastoids and paranasal sinuses are clear. Negative orbit and scalp soft tissues. Visualized bone marrow signal is within normal limits. MRA HEAD FINDINGS Antegrade flow in the posterior circulation with dominant distal left vertebral artery. Both PICA origins are patent. Patent vertebrobasilar junction. No basilar artery stenosis.  SCA and PCA origins are normal. Right PCA branches are within normal limits. Posterior communicating arteries are diminutive or absent. There is a moderate to severe focal stenosis in the left PCA P2 segment (Series 6, image 79). Preserved distal left PCA flow. Antegrade flow in both ICA siphons. No siphon stenosis. Ophthalmic artery origins are normal. Patent carotid termini. Dominant left ACA. ACA origins are normal. Diminutive anterior communicating artery. Visualized ACA branches are within normal limits. Right MCA M1 segment is mildly irregular without stenosis. Visualized MCA branches are within normal limits. Left MCA origin and M1 segment are patent. Mild M1 segment irregularity without stenosis. Patent left MCA bifurcation. No left MCA branch occlusion identified. IMPRESSION: 1. Acute lacunar infarct extending from the posterior left corona radiata to the external capsule. No hemorrhage or mass effect. 2. Superimposed scattered small acute left PCA territory white matter infarcts in the occipital lobe no hemorrhage or mass effect. Favor synchronous atherosclerotic/small vessel disease rather than sequelae of emboli. 3. Intracranial MRA remarkable for moderate to severe left PCA P2 segment stenosis. No anterior circulation stenosis or left MCA branch occlusion identified. 4. Underlying advanced chronic small vessel ischemia in the bilateral deep gray matter nuclei and brainstem. Electronically Signed   By: Genevie Ann M.D.   On: 03/12/2015 14:14   Mr Jodene Nam Head/brain Wo Cm  03/12/2015  CLINICAL DATA:  55 year old female with hypertension, hyperglycemia. One week of headaches, progressed at 0300 hours with associated right  extremity weakness and tingling at that time. Initial encounter. EXAM: MRI HEAD WITHOUT CONTRAST MRA HEAD WITHOUT CONTRAST TECHNIQUE: Multiplanar, multiecho pulse sequences of the brain and surrounding structures were obtained without intravenous contrast. Angiographic images of the head were  obtained using MRA technique without contrast. COMPARISON:  Head CT without contrast 1010 hours today. FINDINGS: MRI HEAD FINDINGS 16 mm curvilinear area of restricted diffusion tracking from the posterior left corona radiata through the external capsule toward the lentiform nuclei. Mild associated T2 and FLAIR hyperintensity. No associated acute hemorrhage or mass effect. Superimposed chronic lacunar infarcts, some with hemosiderin, in the right basal ganglia, bilateral thalami, and anterior limb of the left external capsule. Chronic lacunar infarct in the left pons. There are also superimposed small scattered white matter foci of restricted diffusion in the left occipital lobe best seen on series 4, image 17. Minimal associated T2 and FLAIR hyperintensity with no hemorrhage or mass effect. No right hemisphere or posterior fossa restricted diffusion. Major intracranial vascular flow voids are preserved. No cortical encephalomalacia. No other chronic cerebral blood products. No midline shift, mass effect, evidence of mass lesion, ventriculomegaly, extra-axial collection or acute intracranial hemorrhage. Cervicomedullary junction and pituitary are within normal limits. Negative visualized cervical spine. Visible internal auditory structures appear normal. Mastoids and paranasal sinuses are clear. Negative orbit and scalp soft tissues. Visualized bone marrow signal is within normal limits. MRA HEAD FINDINGS Antegrade flow in the posterior circulation with dominant distal left vertebral artery. Both PICA origins are patent. Patent vertebrobasilar junction. No basilar artery stenosis. SCA and PCA origins are normal. Right PCA branches are within normal limits. Posterior communicating arteries are diminutive or absent. There is a moderate to severe focal stenosis in the left PCA P2 segment (Series 6, image 79). Preserved distal left PCA flow. Antegrade flow in both ICA siphons. No siphon stenosis. Ophthalmic artery  origins are normal. Patent carotid termini. Dominant left ACA. ACA origins are normal. Diminutive anterior communicating artery. Visualized ACA branches are within normal limits. Right MCA M1 segment is mildly irregular without stenosis. Visualized MCA branches are within normal limits. Left MCA origin and M1 segment are patent. Mild M1 segment irregularity without stenosis. Patent left MCA bifurcation. No left MCA branch occlusion identified. IMPRESSION: 1. Acute lacunar infarct extending from the posterior left corona radiata to the external capsule. No hemorrhage or mass effect. 2. Superimposed scattered small acute left PCA territory white matter infarcts in the occipital lobe no hemorrhage or mass effect. Favor synchronous atherosclerotic/small vessel disease rather than sequelae of emboli. 3. Intracranial MRA remarkable for moderate to severe left PCA P2 segment stenosis. No anterior circulation stenosis or left MCA branch occlusion identified. 4. Underlying advanced chronic small vessel ischemia in the bilateral deep gray matter nuclei and brainstem. Electronically Signed   By: Genevie Ann M.D.   On: 03/12/2015 14:14    Assessment & Plan:   There are no diagnoses linked to this encounter. I am having Ms. Moncrief maintain her multivitamin, aspirin, traZODone, metFORMIN, atorvastatin, clopidogrel, metoprolol succinate, valsartan-hydrochlorothiazide, and amLODipine.  No orders of the defined types were placed in this encounter.     Follow-up: No Follow-up on file.  Marilyn Ford, M.D.

## 2015-06-22 ENCOUNTER — Telehealth: Payer: Self-pay | Admitting: *Deleted

## 2015-06-22 LAB — CMP14+EGFR
ALT: 30 IU/L (ref 0–32)
AST: 17 IU/L (ref 0–40)
Albumin/Globulin Ratio: 1.6 (ref 1.1–2.5)
Albumin: 4.8 g/dL (ref 3.5–5.5)
Alkaline Phosphatase: 149 IU/L — ABNORMAL HIGH (ref 39–117)
BUN/Creatinine Ratio: 23 (ref 9–23)
BUN: 20 mg/dL (ref 6–24)
Bilirubin Total: 0.3 mg/dL (ref 0.0–1.2)
CO2: 26 mmol/L (ref 18–29)
CREATININE: 0.88 mg/dL (ref 0.57–1.00)
Calcium: 10.4 mg/dL — ABNORMAL HIGH (ref 8.7–10.2)
Chloride: 97 mmol/L (ref 96–106)
GFR, EST AFRICAN AMERICAN: 86 mL/min/{1.73_m2} (ref >59)
GFR, EST NON AFRICAN AMERICAN: 75 mL/min/{1.73_m2} (ref >59)
GLUCOSE: 107 mg/dL — AB (ref 65–99)
Globulin, Total: 3 g/dL (ref 1.5–4.5)
Potassium: 4.3 mmol/L (ref 3.5–5.2)
Sodium: 142 mmol/L (ref 134–144)
TOTAL PROTEIN: 7.8 g/dL (ref 6.0–8.5)

## 2015-06-22 LAB — LIPID PANEL
CHOL/HDL RATIO: 4.3 ratio (ref 0.0–4.4)
Cholesterol, Total: 143 mg/dL (ref 100–199)
HDL: 33 mg/dL — AB (ref >39)
LDL CALC: 50 mg/dL (ref 0–99)
TRIGLYCERIDES: 301 mg/dL — AB (ref 0–149)
VLDL CHOLESTEROL CAL: 60 mg/dL — AB (ref 5–40)

## 2015-06-22 LAB — MICROALBUMIN / CREATININE URINE RATIO
Creatinine, Urine: 85.5 mg/dL
MICROALB/CREAT RATIO: 44.6 mg/g creat — ABNORMAL HIGH (ref 0.0–30.0)
Microalbumin, Urine: 38.1 ug/mL

## 2015-06-22 NOTE — Telephone Encounter (Signed)
Pt notified of results Verbalizes understanding 

## 2015-06-22 NOTE — Telephone Encounter (Signed)
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 This can lead to kidney problems. Be sure to take the valsartan every day. It ismportant to keep your blood pressure and blood sugar under excellent control to prevent significant kidney problems. Best Regards, Claretta Fraise, M.D.

## 2015-06-29 ENCOUNTER — Telehealth: Payer: Self-pay | Admitting: Family Medicine

## 2015-06-29 NOTE — Telephone Encounter (Signed)
Yes, pt aware.

## 2015-06-30 ENCOUNTER — Telehealth: Payer: Self-pay | Admitting: Family Medicine

## 2015-07-05 NOTE — Telephone Encounter (Signed)
Form filled out for [facility], pt aware

## 2015-07-21 ENCOUNTER — Other Ambulatory Visit: Payer: Self-pay

## 2015-07-21 NOTE — Telephone Encounter (Signed)
Last seen 06/21/15  Dr Livia Snellen  Sending this through to you for approval for Deseryl for mail order

## 2015-07-22 MED ORDER — METFORMIN HCL ER (OSM) 1000 MG PO TB24: 1000.0000 mg | ORAL_TABLET | Freq: Every day | ORAL | Status: DC

## 2015-07-22 MED ORDER — ATORVASTATIN CALCIUM 80 MG PO TABS: 80.0000 mg | ORAL_TABLET | Freq: Every day | ORAL | Status: DC

## 2015-07-22 MED ORDER — VALSARTAN-HYDROCHLOROTHIAZIDE 320-25 MG PO TABS: 1.0000 | ORAL_TABLET | Freq: Every day | ORAL | Status: DC

## 2015-07-22 MED ORDER — CLOPIDOGREL BISULFATE 75 MG PO TABS: 75.0000 mg | ORAL_TABLET | Freq: Every day | ORAL | Status: DC

## 2015-07-22 MED ORDER — TRAZODONE HCL 150 MG PO TABS: ORAL_TABLET | ORAL | Status: DC

## 2015-07-22 MED ORDER — METOPROLOL SUCCINATE ER 50 MG PO TB24: ORAL_TABLET | ORAL | Status: DC

## 2015-07-22 MED ORDER — AMLODIPINE BESYLATE 10 MG PO TABS: ORAL_TABLET | ORAL | Status: DC

## 2015-07-23 ENCOUNTER — Telehealth: Payer: Self-pay | Admitting: Family Medicine

## 2015-07-25 ENCOUNTER — Telehealth: Payer: Self-pay | Admitting: Family Medicine

## 2015-07-25 NOTE — Telephone Encounter (Signed)
Stp and she just wanted to make sure we had [facility] as her pharmacy. Advised pt we do.

## 2015-07-27 ENCOUNTER — Telehealth: Payer: Self-pay | Admitting: *Deleted

## 2015-07-27 NOTE — Telephone Encounter (Signed)
noted 

## 2015-07-27 NOTE — Telephone Encounter (Signed)
Pt's son is incarcerated in [place] Pt wants to know if it is safe for her to ride for 3 hours to go visit him Please advise

## 2015-07-28 ENCOUNTER — Telehealth: Payer: Self-pay | Admitting: Family Medicine

## 2015-07-28 ENCOUNTER — Telehealth: Payer: Self-pay | Admitting: *Deleted

## 2015-07-28 NOTE — Telephone Encounter (Signed)
Pt notified of recommendation Verbalizes understanding 

## 2015-07-28 NOTE — Telephone Encounter (Signed)
Spoke with patient and informed her, per Dr Erlinda Hong, that the cardiac monitoring test done recently was negative for afib. Please continue current treatment. She verbalized understanding, appreciation.

## 2015-07-28 NOTE — Telephone Encounter (Signed)
She will be fine, just stop every1 1/2 hours for a break and walk for several minutes

## 2015-07-28 NOTE — Telephone Encounter (Signed)
noted 

## 2015-07-29 ENCOUNTER — Telehealth: Payer: Self-pay | Admitting: Family Medicine

## 2015-07-29 NOTE — Telephone Encounter (Signed)
Patient wants lettrser stating she cannot ride for 3 hrs to see her son who is incarcerated If we write the letter, they will move her son closer Please review and advise

## 2015-08-01 NOTE — Telephone Encounter (Signed)
Please  write and I will sign. Thanks, WS 

## 2015-08-01 NOTE — Telephone Encounter (Signed)
Pt notified letter is ready for pick up Letter to front for pt pick up

## 2015-09-22 ENCOUNTER — Ambulatory Visit (INDEPENDENT_AMBULATORY_CARE_PROVIDER_SITE_OTHER): Payer: 59 | Admitting: Family Medicine

## 2015-09-22 ENCOUNTER — Encounter: Payer: Self-pay | Admitting: Family Medicine

## 2015-09-22 VITALS — BP 165/83 | HR 84 | Temp 97.2°F | Ht 64.0 in | Wt 149.4 lb

## 2015-09-22 DIAGNOSIS — I69351 Hemiplegia and hemiparesis following cerebral infarction affecting right dominant side: Secondary | ICD-10-CM | POA: Diagnosis not present

## 2015-09-22 DIAGNOSIS — E1159 Type 2 diabetes mellitus with other circulatory complications: Secondary | ICD-10-CM

## 2015-09-22 DIAGNOSIS — E1165 Type 2 diabetes mellitus with hyperglycemia: Secondary | ICD-10-CM | POA: Diagnosis not present

## 2015-09-22 DIAGNOSIS — E785 Hyperlipidemia, unspecified: Secondary | ICD-10-CM

## 2015-09-22 DIAGNOSIS — I10 Essential (primary) hypertension: Secondary | ICD-10-CM | POA: Diagnosis not present

## 2015-09-22 DIAGNOSIS — IMO0002 Reserved for concepts with insufficient information to code with codable children: Secondary | ICD-10-CM

## 2015-09-22 LAB — URINALYSIS
BILIRUBIN UA: NEGATIVE
GLUCOSE, UA: NEGATIVE
KETONES UA: NEGATIVE
Leukocytes, UA: NEGATIVE
Nitrite, UA: NEGATIVE
Protein, UA: NEGATIVE
RBC, UA: NEGATIVE
UUROB: 0.2 mg/dL (ref 0.2–1.0)
pH, UA: 5.5 (ref 5.0–7.5)

## 2015-09-22 LAB — BAYER DCA HB A1C WAIVED: HB A1C (BAYER DCA - WAIVED): 6.2 % (ref <7.0)

## 2015-09-22 NOTE — Progress Notes (Signed)
Subjective:  Patient ID: Marilyn Ford, female    DOB: Sep 06, 1960  Age: 55 y.o. MRN: [id number]  CC: Hypertension; Hyperlipidemia; Diabetes; and Cerebrovascular Accident   HPI Marilyn Ford presents for follow-up of her recent CVA. Marilyn Ford has had weakness in the right upper and lower extremity. Marilyn Ford's finished physical therapy   Marilyn Ford is regaining significant strength and coordination through her therapy. Marilyn Ford is now walking without a walker. Marilyn Ford has a continued limp and weakness on the right. Leg feels heavy. Marilyn Ford continues to use aspirin and Plavix therapy to prevent thromboembolism. At this time Marilyn Ford states Marilyn Ford is tolerating her medicines well. Marilyn Ford denies myalgia from the atorvastatin. Blood pressure checks at home have been stable with systolic in the 025E.  Patient does  check blood sugar at home Log shows glucose fasting 110-30 Patient denies symptoms such as polyuria, polydipsia, excessive hunger, nausea No significant hypoglycemic spells noted. Medications as noted below. Taking them regularly without complication/adverse reaction being reported today.   History Marilyn Ford has a past medical history of Hypertension; Diabetes mellitus without complication ([place]); and Stroke ([place]).   Marilyn Ford has past surgical history that includes Cholecystectomy and Cesarean section.   Her family history includes Diabetes in her brother and mother.Marilyn Ford reports that Marilyn Ford quit smoking about 6 months ago. Her smoking use included Cigarettes. Marilyn Ford started smoking about 15 years ago. Marilyn Ford smoked 0.50 packs per day. Marilyn Ford does not have any smokeless tobacco history on file. Marilyn Ford reports that Marilyn Ford does not drink alcohol or use illicit drugs.  Outpatient Prescriptions Prior to Visit  Medication Sig Dispense Refill  . amLODipine (NORVASC) 10 MG tablet TAKE ONE TABLET BY MOUTH ONCE DAILY FOR BLOOD PRESSURE 90 tablet 3  . aspirin EC 81 MG EC tablet Take 1 tablet (81 mg total) by mouth daily. 30 tablet 0  . atorvastatin (LIPITOR)  80 MG tablet Take 1 tablet (80 mg total) by mouth daily at 6 PM. 90 tablet 1  . clopidogrel (PLAVIX) 75 MG tablet Take 1 tablet (75 mg total) by mouth daily. 90 tablet 1  . metformin (FORTAMET) 1000 MG (OSM) 24 hr tablet Take 1 tablet (1,000 mg total) by mouth daily with breakfast. 90 tablet 3  . metoprolol succinate (TOPROL-XL) 50 MG 24 hr tablet TAKE ONE TABLET BY MOUTH ONCE DAILY TAKE WITH OR IMMEDIATELY FOLLOWING A MEAL 90 tablet 3  . Multiple Vitamin (MULTIVITAMIN) capsule Take 1 capsule by mouth daily.    . traZODone (DESYREL) 150 MG tablet 1 or 2 at bedtime for sleep 180 tablet 1  . valsartan-hydrochlorothiazide (DIOVAN-HCT) 320-25 MG tablet Take 1 tablet by mouth daily. 90 tablet 3   No facility-administered medications prior to visit.    ROS Review of Systems  Constitutional: Negative for fever, activity change and appetite change.  HENT: Negative for congestion, rhinorrhea and sore throat.   Eyes: Negative for visual disturbance.  Respiratory: Negative for cough and shortness of breath.   Cardiovascular: Negative for chest pain and palpitations.  Gastrointestinal: Negative for nausea, abdominal pain and diarrhea.  Genitourinary: Negative for dysuria.  Musculoskeletal: Negative for myalgias and arthralgias.  Neurological: Positive for weakness (left-sided, see history of present illness).  Psychiatric/Behavioral: Negative for behavioral problems, confusion, dysphoric mood, decreased concentration and agitation. The patient is not nervous/anxious.     Objective:  BP 165/83 mmHg  Pulse 84  Temp(Src) 97.2 F (36.2 C) (Oral)  Ht '5\' 4"'  (1.626 m)  Wt 149 lb 6.4 oz (67.767 kg)  BMI 25.63  kg/m2  SpO2 99%  BP Readings from Last 3 Encounters:  09/22/15 165/83  06/21/15 141/85  05/20/15 154/88    Wt Readings from Last 3 Encounters:  09/22/15 149 lb 6.4 oz (67.767 kg)  06/21/15 151 lb 3.2 oz (68.584 kg)  04/20/15 155 lb (70.308 kg)     Physical Exam  Constitutional: Marilyn Ford  is oriented to person, place, and time. Marilyn Ford appears well-developed and well-nourished. No distress.  HENT:  Head: Normocephalic and atraumatic.  Right Ear: External ear normal.  Left Ear: External ear normal.  Nose: Nose normal.  Mouth/Throat: Oropharynx is clear and moist.  Eyes: Conjunctivae and EOM are normal. Pupils are equal, round, and reactive to light.  Neck: Normal range of motion. Neck supple. No thyromegaly present.  Cardiovascular: Normal rate, regular rhythm and normal heart sounds.   No murmur heard. Pulmonary/Chest: Effort normal and breath sounds normal. No respiratory distress. Marilyn Ford has no wheezes. Marilyn Ford has no rales.  Abdominal: Soft. Bowel sounds are normal. Marilyn Ford exhibits no distension. There is no tenderness.  Musculoskeletal:  Marked weakness for ambulation. Right sided limp.Decreased coordination   Lymphadenopathy:    Marilyn Ford has no cervical adenopathy.  Neurological: Marilyn Ford is alert and oriented to person, place, and time. Marilyn Ford has normal reflexes. Coordination (Right sided weakness with 3-4/5 strength & moderate limp. Slow gait. Slightly improved from 3 mos ago assessment.) abnormal.  Skin: Skin is warm and dry.  Psychiatric: Marilyn Ford has a normal mood and affect. Her behavior is normal. Judgment and thought content normal.     Lab Results  Component Value Date   WBC 8.5 03/13/2015   HGB 15.7* 03/13/2015   HCT 43.9 03/13/2015   PLT 264 03/13/2015   GLUCOSE 107* 06/21/2015   CHOL 143 06/21/2015   TRIG 301* 06/21/2015   HDL 33* 06/21/2015   LDLCALC 50 06/21/2015   ALT 30 06/21/2015   AST 17 06/21/2015   NA 142 06/21/2015   K 4.3 06/21/2015   CL 97 06/21/2015   CREATININE 0.88 06/21/2015   BUN 20 06/21/2015   CO2 26 06/21/2015   HGBA1C 6.4 06/21/2015    Dg Chest 2 View  03/12/2015  CLINICAL DATA:  55 year old female with history of altered mental status and headache for 1 week. Blurred vision and weakness today. EXAM: CHEST  2 VIEW COMPARISON:  No priors. FINDINGS:  Lung volumes are normal. No consolidative airspace disease. No pleural effusions. No pneumothorax. No pulmonary nodule or mass noted. Pulmonary vasculature and the cardiomediastinal silhouette are within normal limits. Atherosclerosis in the thoracic aorta. IMPRESSION: 1.  No radiographic evidence of acute cardiopulmonary disease. 2. Atherosclerosis. Electronically Signed   By: Vinnie Langton M.D.   On: 03/12/2015 10:05   Ct Head Wo Contrast  03/12/2015  CLINICAL DATA:  Right-sided weakness and numbness since yesterday. [place] out of bed this morning, now with confusion. EXAM: CT HEAD WITHOUT CONTRAST TECHNIQUE: Contiguous axial images were obtained from the base of the skull through the vertex without intravenous contrast. COMPARISON:  None. FINDINGS: Regional soft tissues appear normal. No radiopaque foreign body. No displaced calvarial fracture. Bilateral basal ganglial lacunar infarcts, right greater than left (representative (image 18, series 2). Bilateral basal ganglial calcifications. Scattered periventricular hypodensities compatible with microvascular ischemic disease. The gray-white differentiation is otherwise well maintained without CT evidence of superimposed acute large territory infarct. No intraparenchymal or extra-axial mass or hemorrhage. Unchanged size and configuration of the ventricles and basilar cisterns. No midline shift. There is diffuse increased attenuation intracranial blood  pool suggestive of volume depletion. Limited visualization the paranasal sinuses and mastoid air cells is normal. No air-fluid levels. IMPRESSION: Advanced microvascular ischemic disease without definite superimposed acute intracranial process. Electronically Signed   By: Sandi Mariscal M.D.   On: 03/12/2015 10:30   Mr Brain Wo Contrast  03/12/2015  CLINICAL DATA:  55 year old female with hypertension, hyperglycemia. One week of headaches, progressed at 0300 hours with associated right extremity weakness and  tingling at that time. Initial encounter. EXAM: MRI HEAD WITHOUT CONTRAST MRA HEAD WITHOUT CONTRAST TECHNIQUE: Multiplanar, multiecho pulse sequences of the brain and surrounding structures were obtained without intravenous contrast. Angiographic images of the head were obtained using MRA technique without contrast. COMPARISON:  Head CT without contrast 1010 hours today. FINDINGS: MRI HEAD FINDINGS 16 mm curvilinear area of restricted diffusion tracking from the posterior left corona radiata through the external capsule toward the lentiform nuclei. Mild associated T2 and FLAIR hyperintensity. No associated acute hemorrhage or mass effect. Superimposed chronic lacunar infarcts, some with hemosiderin, in the right basal ganglia, bilateral thalami, and anterior limb of the left external capsule. Chronic lacunar infarct in the left pons. There are also superimposed small scattered white matter foci of restricted diffusion in the left occipital lobe best seen on series 4, image 17. Minimal associated T2 and FLAIR hyperintensity with no hemorrhage or mass effect. No right hemisphere or posterior fossa restricted diffusion. Major intracranial vascular flow voids are preserved. No cortical encephalomalacia. No other chronic cerebral blood products. No midline shift, mass effect, evidence of mass lesion, ventriculomegaly, extra-axial collection or acute intracranial hemorrhage. Cervicomedullary junction and pituitary are within normal limits. Negative visualized cervical spine. Visible internal auditory structures appear normal. Mastoids and paranasal sinuses are clear. Negative orbit and scalp soft tissues. Visualized bone marrow signal is within normal limits. MRA HEAD FINDINGS Antegrade flow in the posterior circulation with dominant distal left vertebral artery. Both PICA origins are patent. Patent vertebrobasilar junction. No basilar artery stenosis. SCA and PCA origins are normal. Right PCA branches are within normal  limits. Posterior communicating arteries are diminutive or absent. There is a moderate to severe focal stenosis in the left PCA P2 segment (Series 6, image 79). Preserved distal left PCA flow. Antegrade flow in both ICA siphons. No siphon stenosis. Ophthalmic artery origins are normal. Patent carotid termini. Dominant left ACA. ACA origins are normal. Diminutive anterior communicating artery. Visualized ACA branches are within normal limits. Right MCA M1 segment is mildly irregular without stenosis. Visualized MCA branches are within normal limits. Left MCA origin and M1 segment are patent. Mild M1 segment irregularity without stenosis. Patent left MCA bifurcation. No left MCA branch occlusion identified. IMPRESSION: 1. Acute lacunar infarct extending from the posterior left corona radiata to the external capsule. No hemorrhage or mass effect. 2. Superimposed scattered small acute left PCA territory white matter infarcts in the occipital lobe no hemorrhage or mass effect. Favor synchronous atherosclerotic/small vessel disease rather than sequelae of emboli. 3. Intracranial MRA remarkable for moderate to severe left PCA P2 segment stenosis. No anterior circulation stenosis or left MCA branch occlusion identified. 4. Underlying advanced chronic small vessel ischemia in the bilateral deep gray matter nuclei and brainstem. Electronically Signed   By: Genevie Ann M.D.   On: 03/12/2015 14:14   Mr Jodene Nam Head/brain Wo Cm  03/12/2015  CLINICAL DATA:  55 year old female with hypertension, hyperglycemia. One week of headaches, progressed at 0300 hours with associated right extremity weakness and tingling at that time. Initial encounter. EXAM: MRI  HEAD WITHOUT CONTRAST MRA HEAD WITHOUT CONTRAST TECHNIQUE: Multiplanar, multiecho pulse sequences of the brain and surrounding structures were obtained without intravenous contrast. Angiographic images of the head were obtained using MRA technique without contrast. COMPARISON:  Head CT  without contrast 1010 hours today. FINDINGS: MRI HEAD FINDINGS 16 mm curvilinear area of restricted diffusion tracking from the posterior left corona radiata through the external capsule toward the lentiform nuclei. Mild associated T2 and FLAIR hyperintensity. No associated acute hemorrhage or mass effect. Superimposed chronic lacunar infarcts, some with hemosiderin, in the right basal ganglia, bilateral thalami, and anterior limb of the left external capsule. Chronic lacunar infarct in the left pons. There are also superimposed small scattered white matter foci of restricted diffusion in the left occipital lobe best seen on series 4, image 17. Minimal associated T2 and FLAIR hyperintensity with no hemorrhage or mass effect. No right hemisphere or posterior fossa restricted diffusion. Major intracranial vascular flow voids are preserved. No cortical encephalomalacia. No other chronic cerebral blood products. No midline shift, mass effect, evidence of mass lesion, ventriculomegaly, extra-axial collection or acute intracranial hemorrhage. Cervicomedullary junction and pituitary are within normal limits. Negative visualized cervical spine. Visible internal auditory structures appear normal. Mastoids and paranasal sinuses are clear. Negative orbit and scalp soft tissues. Visualized bone marrow signal is within normal limits. MRA HEAD FINDINGS Antegrade flow in the posterior circulation with dominant distal left vertebral artery. Both PICA origins are patent. Patent vertebrobasilar junction. No basilar artery stenosis. SCA and PCA origins are normal. Right PCA branches are within normal limits. Posterior communicating arteries are diminutive or absent. There is a moderate to severe focal stenosis in the left PCA P2 segment (Series 6, image 79). Preserved distal left PCA flow. Antegrade flow in both ICA siphons. No siphon stenosis. Ophthalmic artery origins are normal. Patent carotid termini. Dominant left ACA. ACA  origins are normal. Diminutive anterior communicating artery. Visualized ACA branches are within normal limits. Right MCA M1 segment is mildly irregular without stenosis. Visualized MCA branches are within normal limits. Left MCA origin and M1 segment are patent. Mild M1 segment irregularity without stenosis. Patent left MCA bifurcation. No left MCA branch occlusion identified. IMPRESSION: 1. Acute lacunar infarct extending from the posterior left corona radiata to the external capsule. No hemorrhage or mass effect. 2. Superimposed scattered small acute left PCA territory white matter infarcts in the occipital lobe no hemorrhage or mass effect. Favor synchronous atherosclerotic/small vessel disease rather than sequelae of emboli. 3. Intracranial MRA remarkable for moderate to severe left PCA P2 segment stenosis. No anterior circulation stenosis or left MCA branch occlusion identified. 4. Underlying advanced chronic small vessel ischemia in the bilateral deep gray matter nuclei and brainstem. Electronically Signed   By: Genevie Ann M.D.   On: 03/12/2015 14:14    Assessment & Plan:   Marilyn Ford was seen today for hypertension, hyperlipidemia, diabetes and cerebrovascular accident.  Diagnoses and all orders for this visit:  Hemiparesis affecting right side as late effect of cerebrovascular accident (CVA) ([place])  HLD (hyperlipidemia) -     Lipid panel; Standing  Essential hypertension -     CMP14+EGFR; Standing -     CMP14+EGFR  Uncontrolled type 2 diabetes mellitus with other circulatory complication, without long-term current use of insulin (HCC) -     Bayer DCA Hb A1c Waived; Standing -     CBC with Differential/Platelet; Standing -     CMP14+EGFR; Standing -     Lipid panel; Standing -  Bayer DCA Hb A1c Waived -     CBC with Differential/Platelet -     CMP14+EGFR -     Microalbumin / creatinine urine ratio -     Urinalysis -     Ambulatory referral to Ophthalmology  I am having Marilyn Ford  maintain her multivitamin, aspirin, atorvastatin, clopidogrel, metoprolol succinate, metformin, traZODone, valsartan-hydrochlorothiazide, and amLODipine.  No orders of the defined types were placed in this encounter.     Follow-up: Return in about 3 months (around 12/23/2015).  Claretta Fraise, M.D.

## 2015-09-23 LAB — CMP14+EGFR
ALBUMIN: 4.6 g/dL (ref 3.5–5.5)
ALT: 35 IU/L — AB (ref 0–32)
AST: 22 IU/L (ref 0–40)
Albumin/Globulin Ratio: 1.6 (ref 1.2–2.2)
Alkaline Phosphatase: 149 IU/L — ABNORMAL HIGH (ref 39–117)
BUN / CREAT RATIO: 13 (ref 9–23)
BUN: 14 mg/dL (ref 6–24)
Bilirubin Total: 0.3 mg/dL (ref 0.0–1.2)
CALCIUM: 10.2 mg/dL (ref 8.7–10.2)
CHLORIDE: 94 mmol/L — AB (ref 96–106)
CO2: 25 mmol/L (ref 18–29)
CREATININE: 1.09 mg/dL — AB (ref 0.57–1.00)
GFR, EST AFRICAN AMERICAN: 67 mL/min/{1.73_m2} (ref >59)
GFR, EST NON AFRICAN AMERICAN: 58 mL/min/{1.73_m2} — AB (ref >59)
GLUCOSE: 149 mg/dL — AB (ref 65–99)
Globulin, Total: 2.9 g/dL (ref 1.5–4.5)
Potassium: 3.9 mmol/L (ref 3.5–5.2)
Sodium: 136 mmol/L (ref 134–144)
TOTAL PROTEIN: 7.5 g/dL (ref 6.0–8.5)

## 2015-09-23 LAB — CBC WITH DIFFERENTIAL/PLATELET
BASOS ABS: 0 10*3/uL (ref 0.0–0.2)
Basos: 0 %
EOS (ABSOLUTE): 0.1 10*3/uL (ref 0.0–0.4)
Eos: 2 %
Hematocrit: 36.6 % (ref 34.0–46.6)
Hemoglobin: 12.4 g/dL (ref 11.1–15.9)
Immature Grans (Abs): 0 10*3/uL (ref 0.0–0.1)
Immature Granulocytes: 0 %
LYMPHS ABS: 2.1 10*3/uL (ref 0.7–3.1)
Lymphs: 23 %
MCH: 29.7 pg (ref 26.6–33.0)
MCHC: 33.9 g/dL (ref 31.5–35.7)
MCV: 88 fL (ref 79–97)
MONOCYTES: 6 %
MONOS ABS: 0.5 10*3/uL (ref 0.1–0.9)
NEUTROS PCT: 69 %
Neutrophils Absolute: 6.2 10*3/uL (ref 1.4–7.0)
Platelets: 300 10*3/uL (ref 150–379)
RBC: 4.17 x10E6/uL (ref 3.77–5.28)
RDW: 13.6 % (ref 12.3–15.4)
WBC: 9.1 10*3/uL (ref 3.4–10.8)

## 2015-09-23 LAB — MICROALBUMIN / CREATININE URINE RATIO
Creatinine, Urine: 29.7 mg/dL
Microalbumin, Urine: <3 ug/mL

## 2015-09-26 ENCOUNTER — Telehealth: Payer: Self-pay | Admitting: *Deleted

## 2015-09-26 NOTE — Telephone Encounter (Signed)
------------------------------------------------------------   [facility]             CID [id number]  Patient SAME                 Pt's Dr Erlinda Hong           Area Code 336 Phone# [phone] * DOB [phone]     RE PT CANT READ APPT DATE ON HER CARD/SAYS ITS       EITHER 5/30 OR 6/30/SHE NEEDS TO CXL THAT APPT       Disp:Y/N N If Y = C/B If No Response In 74minutes ============================================================

## 2015-09-28 NOTE — Progress Notes (Signed)
Patient aware.

## 2015-10-04 ENCOUNTER — Ambulatory Visit: Payer: Self-pay | Admitting: Neurology

## 2015-10-05 ENCOUNTER — Telehealth: Payer: Self-pay | Admitting: Family Medicine

## 2015-10-24 ENCOUNTER — Telehealth: Payer: Self-pay | Admitting: Family Medicine

## 2015-10-24 NOTE — Telephone Encounter (Signed)
Tried to call, but it was after 10:30. Will see pt here

## 2015-10-26 LAB — HM DIABETES EYE EXAM

## 2015-11-30 ENCOUNTER — Other Ambulatory Visit: Payer: Self-pay | Admitting: Family Medicine

## 2015-12-26 ENCOUNTER — Ambulatory Visit (INDEPENDENT_AMBULATORY_CARE_PROVIDER_SITE_OTHER): Payer: 59 | Admitting: Family Medicine

## 2015-12-26 ENCOUNTER — Encounter: Payer: Self-pay | Admitting: Family Medicine

## 2015-12-26 VITALS — BP 146/78 | HR 68 | Temp 97.0°F | Ht 64.0 in | Wt 151.6 lb

## 2015-12-26 DIAGNOSIS — I69351 Hemiplegia and hemiparesis following cerebral infarction affecting right dominant side: Secondary | ICD-10-CM

## 2015-12-26 DIAGNOSIS — IMO0002 Reserved for concepts with insufficient information to code with codable children: Secondary | ICD-10-CM

## 2015-12-26 DIAGNOSIS — E1165 Type 2 diabetes mellitus with hyperglycemia: Secondary | ICD-10-CM

## 2015-12-26 DIAGNOSIS — E1159 Type 2 diabetes mellitus with other circulatory complications: Secondary | ICD-10-CM

## 2015-12-26 DIAGNOSIS — E785 Hyperlipidemia, unspecified: Secondary | ICD-10-CM | POA: Diagnosis not present

## 2015-12-26 DIAGNOSIS — I10 Essential (primary) hypertension: Secondary | ICD-10-CM

## 2015-12-26 LAB — URINALYSIS
BILIRUBIN UA: NEGATIVE
GLUCOSE, UA: NEGATIVE
KETONES UA: NEGATIVE
Leukocytes, UA: NEGATIVE
Nitrite, UA: NEGATIVE
PH UA: 6.5 (ref 5.0–7.5)
PROTEIN UA: NEGATIVE
RBC UA: NEGATIVE
SPEC GRAV UA: 1.01 (ref 1.005–1.030)
Urobilinogen, Ur: 0.2 mg/dL (ref 0.2–1.0)

## 2015-12-26 LAB — BAYER DCA HB A1C WAIVED: HB A1C: 6.5 % (ref <7.0)

## 2015-12-26 NOTE — Progress Notes (Signed)
Subjective:  Patient ID: Marilyn Ford, female    DOB: 19-Jun-1960  Age: 55 y.o. MRN: [id number]  CC: Hypertension (3 mth rck) and Diabetes   HPI Marilyn Ford presents for  follow-up of hypertension. Patient has no history of headache chest pain or shortness of breath or recent cough. Patient also denies symptoms of TIA such as numbness weakness lateralizing. Patient checks  blood pressure at home and has not had any elevated readings recently. Patient denies side effects from his medication. States taking it regularly.  Patient also  in for follow-up of elevated cholesterol. Doing well without complaints on current medication. Denies side effects of statin including myalgia and arthralgia and nausea. Also in today for liver function testing. Currently no chest pain, shortness of breath or other cardiovascular related symptoms noted.  Follow-up of diabetes. Patient does check blood sugar at home. Readings run between 110 and 140 Patient denies symptoms such as polyuria, polydipsia, excessive hunger, nausea No significant hypoglycemic spells noted. Medications as noted below. Taking them regularly without complication/adverse reaction being reported today.    History Marilyn Ford has a past medical history of Diabetes mellitus without complication ([place]); Hypertension; and Stroke [facility] [address]).   Marilyn Ford has a past surgical history that includes Cholecystectomy and Cesarean section.   Her family history includes Diabetes in her brother and mother.Marilyn Ford reports that Marilyn Ford quit smoking about 9 months ago. Her smoking use included Cigarettes. Marilyn Ford started smoking about 16 years ago. Marilyn Ford smoked 0.50 packs per day. Marilyn Ford has never used smokeless tobacco. Marilyn Ford reports that Marilyn Ford does not drink alcohol or use drugs.  Current Outpatient Prescriptions on File Prior to Visit  Medication Sig Dispense Refill  . amLODipine (NORVASC) 10 MG tablet TAKE ONE TABLET BY MOUTH ONCE DAILY FOR BLOOD PRESSURE 90 tablet 3  . aspirin EC  81 MG EC tablet Take 1 tablet (81 mg total) by mouth daily. 30 tablet 0  . atorvastatin (LIPITOR) 80 MG tablet TAKE 1 TABLET DAILY AT 6 P.M. 90 tablet 0  . clopidogrel (PLAVIX) 75 MG tablet TAKE 1 TABLET DAILY 90 tablet 0  . metformin (FORTAMET) 1000 MG (OSM) 24 hr tablet Take 1 tablet (1,000 mg total) by mouth daily with breakfast. 90 tablet 3  . metoprolol succinate (TOPROL-XL) 50 MG 24 hr tablet TAKE ONE TABLET BY MOUTH ONCE DAILY TAKE WITH OR IMMEDIATELY FOLLOWING A MEAL 90 tablet 3  . Multiple Vitamin (MULTIVITAMIN) capsule Take 1 capsule by mouth daily.    . traZODone (DESYREL) 150 MG tablet 1 or 2 at bedtime for sleep 180 tablet 1  . valsartan-hydrochlorothiazide (DIOVAN-HCT) 320-25 MG tablet Take 1 tablet by mouth daily. 90 tablet 3   No current facility-administered medications on file prior to visit.     ROS Review of Systems  Constitutional: Negative for activity change, appetite change and fever.  HENT: Negative for congestion, rhinorrhea and sore throat.   Eyes: Negative for visual disturbance.  Respiratory: Negative for cough and shortness of breath.   Cardiovascular: Negative for chest pain and palpitations.  Gastrointestinal: Negative for abdominal pain, diarrhea and nausea.  Genitourinary: Negative for dysuria.  Musculoskeletal: Negative for arthralgias and myalgias.    Objective:  BP (!) 146/78 (BP Location: Left Arm, Patient Position: Sitting, Cuff Size: Normal)   Pulse 68   Temp 97 F (36.1 C) (Oral)   Ht '5\' 4"'  (1.626 m)   Wt 151 lb 9.6 oz (68.8 kg)   SpO2 100%   BMI 26.02 kg/m  BP Readings from Last 3 Encounters:  12/26/15 (!) 146/78  09/22/15 (!) 165/83  06/21/15 (!) 141/85    Wt Readings from Last 3 Encounters:  12/26/15 151 lb 9.6 oz (68.8 kg)  09/22/15 149 lb 6.4 oz (67.8 kg)  06/21/15 151 lb 3.2 oz (68.6 kg)     Physical Exam  Constitutional: Marilyn Ford is oriented to person, place, and time. Marilyn Ford appears well-developed and well-nourished. No  distress.  HENT:  Head: Normocephalic and atraumatic.  Right Ear: External ear normal.  Left Ear: External ear normal.  Nose: Nose normal.  Mouth/Throat: Oropharynx is clear and moist.  Eyes: Conjunctivae and EOM are normal. Pupils are equal, round, and reactive to light.  Neck: Normal range of motion. Neck supple. No thyromegaly present.  Cardiovascular: Normal rate, regular rhythm and normal heart sounds.   No murmur heard. Pulmonary/Chest: Effort normal and breath sounds normal. No respiratory distress. Marilyn Ford has no wheezes. Marilyn Ford has no rales.  Abdominal: Soft. Bowel sounds are normal. Marilyn Ford exhibits no distension. There is no tenderness.  Lymphadenopathy:    Marilyn Ford has no cervical adenopathy.  Neurological: Marilyn Ford is alert and oriented to person, place, and time. Marilyn Ford has normal reflexes.  Some residual right sided weakness   Skin: Skin is warm and dry.  Psychiatric: Marilyn Ford has a normal mood and affect. Her behavior is normal. Judgment and thought content normal.    Lab Results  Component Value Date   HGBA1C 6.4 06/21/2015   HGBA1C 6.8 (H) 03/13/2015   HGBA1C 6.8 12/14/2014    Lab Results  Component Value Date   WBC 9.1 09/22/2015   HGB 15.7 (H) 03/13/2015   HCT 36.6 09/22/2015   PLT 300 09/22/2015   GLUCOSE 149 (H) 09/22/2015   CHOL 143 06/21/2015   TRIG 301 (H) 06/21/2015   HDL 33 (L) 06/21/2015   LDLCALC 50 06/21/2015   ALT 35 (H) 09/22/2015   AST 22 09/22/2015   NA 136 09/22/2015   K 3.9 09/22/2015   CL 94 (L) 09/22/2015   CREATININE 1.09 (H) 09/22/2015   BUN 14 09/22/2015   CO2 25 09/22/2015   HGBA1C 6.4 06/21/2015    Dg Chest 2 View  Result Date: 03/12/2015 CLINICAL DATA:  55 year old female with history of altered mental status and headache for 1 week. Blurred vision and weakness today. EXAM: CHEST  2 VIEW COMPARISON:  No priors. FINDINGS: Lung volumes are normal. No consolidative airspace disease. No pleural effusions. No pneumothorax. No pulmonary nodule or mass  noted. Pulmonary vasculature and the cardiomediastinal silhouette are within normal limits. Atherosclerosis in the thoracic aorta. IMPRESSION: 1.  No radiographic evidence of acute cardiopulmonary disease. 2. Atherosclerosis. Electronically Signed   By: Vinnie Langton M.D.   On: 03/12/2015 10:05   Ct Head Wo Contrast  Result Date: 03/12/2015 CLINICAL DATA:  Right-sided weakness and numbness since yesterday. [place] out of bed this morning, now with confusion. EXAM: CT HEAD WITHOUT CONTRAST TECHNIQUE: Contiguous axial images were obtained from the base of the skull through the vertex without intravenous contrast. COMPARISON:  None. FINDINGS: Regional soft tissues appear normal. No radiopaque foreign body. No displaced calvarial fracture. Bilateral basal ganglial lacunar infarcts, right greater than left (representative (image 18, series 2). Bilateral basal ganglial calcifications. Scattered periventricular hypodensities compatible with microvascular ischemic disease. The gray-white differentiation is otherwise well maintained without CT evidence of superimposed acute large territory infarct. No intraparenchymal or extra-axial mass or hemorrhage. Unchanged size and configuration of the ventricles and basilar cisterns. No midline  shift. There is diffuse increased attenuation intracranial blood pool suggestive of volume depletion. Limited visualization the paranasal sinuses and mastoid air cells is normal. No air-fluid levels. IMPRESSION: Advanced microvascular ischemic disease without definite superimposed acute intracranial process. Electronically Signed   By: Sandi Mariscal M.D.   On: 03/12/2015 10:30   Mr Brain Wo Contrast  Result Date: 03/12/2015 CLINICAL DATA:  55 year old female with hypertension, hyperglycemia. One week of headaches, progressed at 0300 hours with associated right extremity weakness and tingling at that time. Initial encounter. EXAM: MRI HEAD WITHOUT CONTRAST MRA HEAD WITHOUT CONTRAST  TECHNIQUE: Multiplanar, multiecho pulse sequences of the brain and surrounding structures were obtained without intravenous contrast. Angiographic images of the head were obtained using MRA technique without contrast. COMPARISON:  Head CT without contrast 1010 hours today. FINDINGS: MRI HEAD FINDINGS 16 mm curvilinear area of restricted diffusion tracking from the posterior left corona radiata through the external capsule toward the lentiform nuclei. Mild associated T2 and FLAIR hyperintensity. No associated acute hemorrhage or mass effect. Superimposed chronic lacunar infarcts, some with hemosiderin, in the right basal ganglia, bilateral thalami, and anterior limb of the left external capsule. Chronic lacunar infarct in the left pons. There are also superimposed small scattered white matter foci of restricted diffusion in the left occipital lobe best seen on series 4, image 17. Minimal associated T2 and FLAIR hyperintensity with no hemorrhage or mass effect. No right hemisphere or posterior fossa restricted diffusion. Major intracranial vascular flow voids are preserved. No cortical encephalomalacia. No other chronic cerebral blood products. No midline shift, mass effect, evidence of mass lesion, ventriculomegaly, extra-axial collection or acute intracranial hemorrhage. Cervicomedullary junction and pituitary are within normal limits. Negative visualized cervical spine. Visible internal auditory structures appear normal. Mastoids and paranasal sinuses are clear. Negative orbit and scalp soft tissues. Visualized bone marrow signal is within normal limits. MRA HEAD FINDINGS Antegrade flow in the posterior circulation with dominant distal left vertebral artery. Both PICA origins are patent. Patent vertebrobasilar junction. No basilar artery stenosis. SCA and PCA origins are normal. Right PCA branches are within normal limits. Posterior communicating arteries are diminutive or absent. There is a moderate to severe  focal stenosis in the left PCA P2 segment (Series 6, image 79). Preserved distal left PCA flow. Antegrade flow in both ICA siphons. No siphon stenosis. Ophthalmic artery origins are normal. Patent carotid termini. Dominant left ACA. ACA origins are normal. Diminutive anterior communicating artery. Visualized ACA branches are within normal limits. Right MCA M1 segment is mildly irregular without stenosis. Visualized MCA branches are within normal limits. Left MCA origin and M1 segment are patent. Mild M1 segment irregularity without stenosis. Patent left MCA bifurcation. No left MCA branch occlusion identified. IMPRESSION: 1. Acute lacunar infarct extending from the posterior left corona radiata to the external capsule. No hemorrhage or mass effect. 2. Superimposed scattered small acute left PCA territory white matter infarcts in the occipital lobe no hemorrhage or mass effect. Favor synchronous atherosclerotic/small vessel disease rather than sequelae of emboli. 3. Intracranial MRA remarkable for moderate to severe left PCA P2 segment stenosis. No anterior circulation stenosis or left MCA branch occlusion identified. 4. Underlying advanced chronic small vessel ischemia in the bilateral deep gray matter nuclei and brainstem. Electronically Signed   By: Genevie Ann M.D.   On: 03/12/2015 14:14   Mr Jodene Nam Head/brain VG Cm  Result Date: 03/12/2015 CLINICAL DATA:  55 year old female with hypertension, hyperglycemia. One week of headaches, progressed at 0300 hours with associated right extremity  weakness and tingling at that time. Initial encounter. EXAM: MRI HEAD WITHOUT CONTRAST MRA HEAD WITHOUT CONTRAST TECHNIQUE: Multiplanar, multiecho pulse sequences of the brain and surrounding structures were obtained without intravenous contrast. Angiographic images of the head were obtained using MRA technique without contrast. COMPARISON:  Head CT without contrast 1010 hours today. FINDINGS: MRI HEAD FINDINGS 16 mm curvilinear area  of restricted diffusion tracking from the posterior left corona radiata through the external capsule toward the lentiform nuclei. Mild associated T2 and FLAIR hyperintensity. No associated acute hemorrhage or mass effect. Superimposed chronic lacunar infarcts, some with hemosiderin, in the right basal ganglia, bilateral thalami, and anterior limb of the left external capsule. Chronic lacunar infarct in the left pons. There are also superimposed small scattered white matter foci of restricted diffusion in the left occipital lobe best seen on series 4, image 17. Minimal associated T2 and FLAIR hyperintensity with no hemorrhage or mass effect. No right hemisphere or posterior fossa restricted diffusion. Major intracranial vascular flow voids are preserved. No cortical encephalomalacia. No other chronic cerebral blood products. No midline shift, mass effect, evidence of mass lesion, ventriculomegaly, extra-axial collection or acute intracranial hemorrhage. Cervicomedullary junction and pituitary are within normal limits. Negative visualized cervical spine. Visible internal auditory structures appear normal. Mastoids and paranasal sinuses are clear. Negative orbit and scalp soft tissues. Visualized bone marrow signal is within normal limits. MRA HEAD FINDINGS Antegrade flow in the posterior circulation with dominant distal left vertebral artery. Both PICA origins are patent. Patent vertebrobasilar junction. No basilar artery stenosis. SCA and PCA origins are normal. Right PCA branches are within normal limits. Posterior communicating arteries are diminutive or absent. There is a moderate to severe focal stenosis in the left PCA P2 segment (Series 6, image 79). Preserved distal left PCA flow. Antegrade flow in both ICA siphons. No siphon stenosis. Ophthalmic artery origins are normal. Patent carotid termini. Dominant left ACA. ACA origins are normal. Diminutive anterior communicating artery. Visualized ACA branches are  within normal limits. Right MCA M1 segment is mildly irregular without stenosis. Visualized MCA branches are within normal limits. Left MCA origin and M1 segment are patent. Mild M1 segment irregularity without stenosis. Patent left MCA bifurcation. No left MCA branch occlusion identified. IMPRESSION: 1. Acute lacunar infarct extending from the posterior left corona radiata to the external capsule. No hemorrhage or mass effect. 2. Superimposed scattered small acute left PCA territory white matter infarcts in the occipital lobe no hemorrhage or mass effect. Favor synchronous atherosclerotic/small vessel disease rather than sequelae of emboli. 3. Intracranial MRA remarkable for moderate to severe left PCA P2 segment stenosis. No anterior circulation stenosis or left MCA branch occlusion identified. 4. Underlying advanced chronic small vessel ischemia in the bilateral deep gray matter nuclei and brainstem. Electronically Signed   By: Genevie Ann M.D.   On: 03/12/2015 14:14    Assessment & Plan:   Dannica was seen today for hypertension and diabetes.  Diagnoses and all orders for this visit:  Hemiparesis affecting right side as late effect of cerebrovascular accident (CVA) ([place])  Uncontrolled type 2 diabetes mellitus with other circulatory complication, without long-term current use of insulin (HCC) -     Microalbumin / creatinine urine ratio -     Urinalysis -     Bayer DCA Hb A1c Waived -     Lipid panel -     CMP14+EGFR  HLD (hyperlipidemia) -     Lipid panel -     CMP14+EGFR  Essential hypertension -  CMP14+EGFR   I am having Marilyn Ford maintain her multivitamin, aspirin, metoprolol succinate, metformin, traZODone, valsartan-hydrochlorothiazide, amLODipine, clopidogrel, and atorvastatin. Patient insists that her blood pressures at home are excellent. 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 Instead of increasing medication today, I recommended Marilyn Ford monitor her systolic blood  pressure at home and at rest it should be 130 or less. Diastolic 85 or less Follow-up: Return in about 3 months (around 03/27/2016).  Claretta Fraise, M.D.

## 2015-12-27 LAB — CMP14+EGFR
ALBUMIN: 4.7 g/dL (ref 3.5–5.5)
ALT: 40 IU/L — ABNORMAL HIGH (ref 0–32)
AST: 26 IU/L (ref 0–40)
Albumin/Globulin Ratio: 1.6 (ref 1.2–2.2)
Alkaline Phosphatase: 158 IU/L — ABNORMAL HIGH (ref 39–117)
BILIRUBIN TOTAL: 0.4 mg/dL (ref 0.0–1.2)
BUN / CREAT RATIO: 12 (ref 9–23)
BUN: 11 mg/dL (ref 6–24)
CO2: 25 mmol/L (ref 18–29)
CREATININE: 0.94 mg/dL (ref 0.57–1.00)
Calcium: 9.9 mg/dL (ref 8.7–10.2)
Chloride: 96 mmol/L (ref 96–106)
GFR calc Af Amer: 80 mL/min/{1.73_m2} (ref >59)
GFR, EST NON AFRICAN AMERICAN: 69 mL/min/{1.73_m2} (ref >59)
GLOBULIN, TOTAL: 2.9 g/dL (ref 1.5–4.5)
GLUCOSE: 131 mg/dL — AB (ref 65–99)
Potassium: 4.4 mmol/L (ref 3.5–5.2)
Sodium: 139 mmol/L (ref 134–144)
TOTAL PROTEIN: 7.6 g/dL (ref 6.0–8.5)

## 2015-12-27 LAB — LIPID PANEL
CHOLESTEROL TOTAL: 150 mg/dL (ref 100–199)
Chol/HDL Ratio: 3.3 ratio units (ref 0.0–4.4)
HDL: 46 mg/dL (ref >39)
LDL Calculated: 61 mg/dL (ref 0–99)
Triglycerides: 213 mg/dL — ABNORMAL HIGH (ref 0–149)
VLDL Cholesterol Cal: 43 mg/dL — ABNORMAL HIGH (ref 5–40)

## 2015-12-27 LAB — MICROALBUMIN / CREATININE URINE RATIO
Creatinine, Urine: 29.5 mg/dL
MICROALB/CREAT RATIO: 19 mg/g{creat} (ref 0.0–30.0)
MICROALBUM., U, RANDOM: 5.6 ug/mL

## 2016-02-28 ENCOUNTER — Other Ambulatory Visit: Payer: Self-pay | Admitting: Family Medicine

## 2016-02-29 ENCOUNTER — Telehealth: Payer: Self-pay | Admitting: Family Medicine

## 2016-03-01 NOTE — Telephone Encounter (Signed)
Done, [facility] that they are ready and there is a fee

## 2016-03-02 DIAGNOSIS — Z0289 Encounter for other administrative examinations: Secondary | ICD-10-CM

## 2016-03-27 ENCOUNTER — Ambulatory Visit: Payer: Self-pay | Admitting: Family Medicine

## 2016-04-03 ENCOUNTER — Ambulatory Visit: Payer: Self-pay | Admitting: Family Medicine

## 2016-04-04 ENCOUNTER — Encounter: Payer: Self-pay | Admitting: Family Medicine

## 2016-04-11 ENCOUNTER — Encounter: Payer: Self-pay | Admitting: Family Medicine

## 2016-04-11 ENCOUNTER — Ambulatory Visit (INDEPENDENT_AMBULATORY_CARE_PROVIDER_SITE_OTHER): Payer: 59 | Admitting: Family Medicine

## 2016-04-11 ENCOUNTER — Encounter (INDEPENDENT_AMBULATORY_CARE_PROVIDER_SITE_OTHER): Payer: Self-pay

## 2016-04-11 VITALS — BP 132/86 | HR 67 | Temp 97.7°F | Ht 64.0 in | Wt 153.4 lb

## 2016-04-11 DIAGNOSIS — E782 Mixed hyperlipidemia: Secondary | ICD-10-CM

## 2016-04-11 DIAGNOSIS — I69351 Hemiplegia and hemiparesis following cerebral infarction affecting right dominant side: Secondary | ICD-10-CM

## 2016-04-11 DIAGNOSIS — E1149 Type 2 diabetes mellitus with other diabetic neurological complication: Secondary | ICD-10-CM

## 2016-04-11 DIAGNOSIS — I10 Essential (primary) hypertension: Secondary | ICD-10-CM | POA: Diagnosis not present

## 2016-04-11 LAB — BAYER DCA HB A1C WAIVED: HB A1C (BAYER DCA - WAIVED): 6 % (ref <7.0)

## 2016-04-11 MED ORDER — ATORVASTATIN CALCIUM 80 MG PO TABS: ORAL_TABLET | ORAL | 1 refills | Status: DC

## 2016-04-11 MED ORDER — CLOPIDOGREL BISULFATE 75 MG PO TABS: 75.0000 mg | ORAL_TABLET | Freq: Every day | ORAL | 1 refills | Status: DC

## 2016-04-11 MED ORDER — AMLODIPINE BESYLATE 10 MG PO TABS: ORAL_TABLET | ORAL | 3 refills | Status: DC

## 2016-04-11 MED ORDER — METFORMIN HCL ER (OSM) 1000 MG PO TB24: 1000.0000 mg | ORAL_TABLET | Freq: Every day | ORAL | 3 refills | Status: DC

## 2016-04-11 MED ORDER — VALSARTAN-HYDROCHLOROTHIAZIDE 320-25 MG PO TABS: 1.0000 | ORAL_TABLET | Freq: Every day | ORAL | 3 refills | Status: DC

## 2016-04-11 MED ORDER — TRAZODONE HCL 150 MG PO TABS: ORAL_TABLET | ORAL | 1 refills | Status: DC

## 2016-04-11 MED ORDER — METOPROLOL SUCCINATE ER 50 MG PO TB24: ORAL_TABLET | ORAL | 3 refills | Status: DC

## 2016-04-11 NOTE — Progress Notes (Signed)
Subjective:  Patient ID: Marilyn Ford, female    DOB: 1961-01-16  Age: 55 y.o. MRN: [id number]  CC: Diabetes (3 month recheck); Hyperlipidemia; Hypertension; and Cerebrovascular Accident   HPI Marilyn Ford presents for  follow-up of hypertension. Patient has no history of headache chest pain or shortness of breath or recent cough. Patient also denies symptoms of TIA such as numbness weakness lateralizing. Patient checks  blood pressure at home and has not had any elevated readings recently. Patient denies side effects from his medication. States taking it regularly.  Patient also  in for follow-up of elevated cholesterol. Doing well without complaints on current medication. Denies side effects of statin including myalgia and arthralgia and nausea. Also in today for liver function testing. Currently no chest pain, shortness of breath or other cardiovascular related symptoms noted.  Follow-up of diabetes. Patient does check blood sugar at home. Readings run between100-150 Patient denies symptoms such as polyuria, polydipsia, excessive hunger, nausea No significant hypoglycemic spells noted. Medications as noted below. Taking them regularly without complication/adverse reaction being reported today.    History Marilyn Ford has a past medical history of Diabetes mellitus without complication ([place]); Hypertension; and Stroke [facility]).   She has a past surgical history that includes Cholecystectomy and Cesarean section.   Her family history includes Diabetes in her brother and mother.She reports that she quit smoking about 13 months ago. Her smoking use included Cigarettes. She started smoking about 16 years ago. She smoked 0.50 packs per day. She has never used smokeless tobacco. She reports that she does not drink alcohol or use drugs.  Current Outpatient Prescriptions on File Prior to Visit  Medication Sig Dispense Refill  . aspirin EC 81 MG EC tablet Take 1 tablet (81 mg total) by mouth daily.  30 tablet 0  . Multiple Vitamin (MULTIVITAMIN) capsule Take 1 capsule by mouth daily.     No current facility-administered medications on file prior to visit.     ROS Review of Systems  Constitutional: Negative for activity change, appetite change and fever.  HENT: Negative for congestion, rhinorrhea and sore throat.   Eyes: Negative for visual disturbance.  Respiratory: Negative for cough and shortness of breath.   Cardiovascular: Negative for chest pain and palpitations.  Gastrointestinal: Negative for abdominal pain, diarrhea and nausea.  Genitourinary: Negative for dysuria.  Musculoskeletal: Negative for arthralgias and myalgias.    Objective:  BP 132/86   Pulse 67   Temp 97.7 F (36.5 C) (Oral)   Ht _0  (1.626 m)   Wt 153 lb 6.4 oz (69.6 kg)   BMI 26.33 kg/m   BP Readings from Last 3 Encounters:  04/11/16 132/86  12/26/15 (!) 146/78  09/22/15 (!) 165/83    Wt Readings from Last 3 Encounters:  04/11/16 153 lb 6.4 oz (69.6 kg)  12/26/15 151 lb 9.6 oz (68.8 kg)  09/22/15 149 lb 6.4 oz (67.8 kg)     Physical Exam  Constitutional: She is oriented to person, place, and time. She appears well-developed and well-nourished. No distress.  HENT:  Head: Normocephalic and atraumatic.  Right Ear: External ear normal.  Left Ear: External ear normal.  Nose: Nose normal.  Mouth/Throat: Oropharynx is clear and moist.  Eyes: Conjunctivae and EOM are normal. Pupils are equal, round, and reactive to light.  Neck: Normal range of motion. Neck supple. No thyromegaly present.  Cardiovascular: Normal rate, regular rhythm and normal heart sounds.   No murmur heard. Pulmonary/Chest: Effort normal and breath sounds  normal. No respiratory distress. She has no wheezes. She has no rales.  Abdominal: Soft. Bowel sounds are normal. She exhibits no distension. There is no tenderness.  Lymphadenopathy:    She has no cervical adenopathy.  Neurological: She is alert and oriented to person,  place, and time. She has normal reflexes.  Skin: Skin is warm and dry.  Psychiatric: She has a normal mood and affect. Her behavior is normal. Judgment and thought content normal.    Lab Results  Component Value Date   HGBA1C 6.4 06/21/2015   HGBA1C 6.8 (H) 03/13/2015   HGBA1C 6.8 12/14/2014    Lab Results  Component Value Date   WBC 9.1 09/22/2015   HGB 15.7 (H) 03/13/2015   HCT 36.6 09/22/2015   PLT 300 09/22/2015   GLUCOSE 89 04/11/2016   CHOL 146 04/11/2016   TRIG 257 (H) 04/11/2016   HDL 39 (L) 04/11/2016   LDLCALC 56 04/11/2016   ALT 30 04/11/2016   AST 17 04/11/2016   NA 141 04/11/2016   K 4.2 04/11/2016   CL 98 04/11/2016   CREATININE 0.90 04/11/2016   BUN 13 04/11/2016   CO2 26 04/11/2016   HGBA1C 6.4 06/21/2015    Dg Chest 2 View  Result Date: 03/12/2015 CLINICAL DATA:  55 year old female with history of altered mental status and headache for 1 week. Blurred vision and weakness today. EXAM: CHEST  2 VIEW COMPARISON:  No priors. FINDINGS: Lung volumes are normal. No consolidative airspace disease. No pleural effusions. No pneumothorax. No pulmonary nodule or mass noted. Pulmonary vasculature and the cardiomediastinal silhouette are within normal limits. Atherosclerosis in the thoracic aorta. IMPRESSION: 1.  No radiographic evidence of acute cardiopulmonary disease. 2. Atherosclerosis. Electronically Signed   By: Vinnie Langton M.D.   On: 03/12/2015 10:05   Ct Head Wo Contrast  Result Date: 03/12/2015 CLINICAL DATA:  Right-sided weakness and numbness since yesterday. [place] out of bed this morning, now with confusion. EXAM: CT HEAD WITHOUT CONTRAST TECHNIQUE: Contiguous axial images were obtained from the base of the skull through the vertex without intravenous contrast. COMPARISON:  None. FINDINGS: Regional soft tissues appear normal. No radiopaque foreign body. No displaced calvarial fracture. Bilateral basal ganglial lacunar infarcts, right greater than left  (representative (image 18, series 2). Bilateral basal ganglial calcifications. Scattered periventricular hypodensities compatible with microvascular ischemic disease. The gray-white differentiation is otherwise well maintained without CT evidence of superimposed acute large territory infarct. No intraparenchymal or extra-axial mass or hemorrhage. Unchanged size and configuration of the ventricles and basilar cisterns. No midline shift. There is diffuse increased attenuation intracranial blood pool suggestive of volume depletion. Limited visualization the paranasal sinuses and mastoid air cells is normal. No air-fluid levels. IMPRESSION: Advanced microvascular ischemic disease without definite superimposed acute intracranial process. Electronically Signed   By: Sandi Mariscal M.D.   On: 03/12/2015 10:30   Mr Brain Wo Contrast  Result Date: 03/12/2015 CLINICAL DATA:  55 year old female with hypertension, hyperglycemia. One week of headaches, progressed at 0300 hours with associated right extremity weakness and tingling at that time. Initial encounter. EXAM: MRI HEAD WITHOUT CONTRAST MRA HEAD WITHOUT CONTRAST TECHNIQUE: Multiplanar, multiecho pulse sequences of the brain and surrounding structures were obtained without intravenous contrast. Angiographic images of the head were obtained using MRA technique without contrast. COMPARISON:  Head CT without contrast 1010 hours today. FINDINGS: MRI HEAD FINDINGS 16 mm curvilinear area of restricted diffusion tracking from the posterior left corona radiata through the external capsule toward the lentiform nuclei.  Mild associated T2 and FLAIR hyperintensity. No associated acute hemorrhage or mass effect. Superimposed chronic lacunar infarcts, some with hemosiderin, in the right basal ganglia, bilateral thalami, and anterior limb of the left external capsule. Chronic lacunar infarct in the left pons. There are also superimposed small scattered white matter foci of restricted  diffusion in the left occipital lobe best seen on series 4, image 17. Minimal associated T2 and FLAIR hyperintensity with no hemorrhage or mass effect. No right hemisphere or posterior fossa restricted diffusion. Major intracranial vascular flow voids are preserved. No cortical encephalomalacia. No other chronic cerebral blood products. No midline shift, mass effect, evidence of mass lesion, ventriculomegaly, extra-axial collection or acute intracranial hemorrhage. Cervicomedullary junction and pituitary are within normal limits. Negative visualized cervical spine. Visible internal auditory structures appear normal. Mastoids and paranasal sinuses are clear. Negative orbit and scalp soft tissues. Visualized bone marrow signal is within normal limits. MRA HEAD FINDINGS Antegrade flow in the posterior circulation with dominant distal left vertebral artery. Both PICA origins are patent. Patent vertebrobasilar junction. No basilar artery stenosis. SCA and PCA origins are normal. Right PCA branches are within normal limits. Posterior communicating arteries are diminutive or absent. There is a moderate to severe focal stenosis in the left PCA P2 segment (Series 6, image 79). Preserved distal left PCA flow. Antegrade flow in both ICA siphons. No siphon stenosis. Ophthalmic artery origins are normal. Patent carotid termini. Dominant left ACA. ACA origins are normal. Diminutive anterior communicating artery. Visualized ACA branches are within normal limits. Right MCA M1 segment is mildly irregular without stenosis. Visualized MCA branches are within normal limits. Left MCA origin and M1 segment are patent. Mild M1 segment irregularity without stenosis. Patent left MCA bifurcation. No left MCA branch occlusion identified. IMPRESSION: 1. Acute lacunar infarct extending from the posterior left corona radiata to the external capsule. No hemorrhage or mass effect. 2. Superimposed scattered small acute left PCA territory white  matter infarcts in the occipital lobe no hemorrhage or mass effect. Favor synchronous atherosclerotic/small vessel disease rather than sequelae of emboli. 3. Intracranial MRA remarkable for moderate to severe left PCA P2 segment stenosis. No anterior circulation stenosis or left MCA branch occlusion identified. 4. Underlying advanced chronic small vessel ischemia in the bilateral deep gray matter nuclei and brainstem. Electronically Signed   By: Genevie Ann M.D.   On: 03/12/2015 14:14   Mr Jodene Nam Head/brain DP Cm  Result Date: 03/12/2015 CLINICAL DATA:  55 year old female with hypertension, hyperglycemia. One week of headaches, progressed at 0300 hours with associated right extremity weakness and tingling at that time. Initial encounter. EXAM: MRI HEAD WITHOUT CONTRAST MRA HEAD WITHOUT CONTRAST TECHNIQUE: Multiplanar, multiecho pulse sequences of the brain and surrounding structures were obtained without intravenous contrast. Angiographic images of the head were obtained using MRA technique without contrast. COMPARISON:  Head CT without contrast 1010 hours today. FINDINGS: MRI HEAD FINDINGS 16 mm curvilinear area of restricted diffusion tracking from the posterior left corona radiata through the external capsule toward the lentiform nuclei. Mild associated T2 and FLAIR hyperintensity. No associated acute hemorrhage or mass effect. Superimposed chronic lacunar infarcts, some with hemosiderin, in the right basal ganglia, bilateral thalami, and anterior limb of the left external capsule. Chronic lacunar infarct in the left pons. There are also superimposed small scattered white matter foci of restricted diffusion in the left occipital lobe best seen on series 4, image 17. Minimal associated T2 and FLAIR hyperintensity with no hemorrhage or mass effect. No right hemisphere or  posterior fossa restricted diffusion. Major intracranial vascular flow voids are preserved. No cortical encephalomalacia. No other chronic cerebral  blood products. No midline shift, mass effect, evidence of mass lesion, ventriculomegaly, extra-axial collection or acute intracranial hemorrhage. Cervicomedullary junction and pituitary are within normal limits. Negative visualized cervical spine. Visible internal auditory structures appear normal. Mastoids and paranasal sinuses are clear. Negative orbit and scalp soft tissues. Visualized bone marrow signal is within normal limits. MRA HEAD FINDINGS Antegrade flow in the posterior circulation with dominant distal left vertebral artery. Both PICA origins are patent. Patent vertebrobasilar junction. No basilar artery stenosis. SCA and PCA origins are normal. Right PCA branches are within normal limits. Posterior communicating arteries are diminutive or absent. There is a moderate to severe focal stenosis in the left PCA P2 segment (Series 6, image 79). Preserved distal left PCA flow. Antegrade flow in both ICA siphons. No siphon stenosis. Ophthalmic artery origins are normal. Patent carotid termini. Dominant left ACA. ACA origins are normal. Diminutive anterior communicating artery. Visualized ACA branches are within normal limits. Right MCA M1 segment is mildly irregular without stenosis. Visualized MCA branches are within normal limits. Left MCA origin and M1 segment are patent. Mild M1 segment irregularity without stenosis. Patent left MCA bifurcation. No left MCA branch occlusion identified. IMPRESSION: 1. Acute lacunar infarct extending from the posterior left corona radiata to the external capsule. No hemorrhage or mass effect. 2. Superimposed scattered small acute left PCA territory white matter infarcts in the occipital lobe no hemorrhage or mass effect. Favor synchronous atherosclerotic/small vessel disease rather than sequelae of emboli. 3. Intracranial MRA remarkable for moderate to severe left PCA P2 segment stenosis. No anterior circulation stenosis or left MCA branch occlusion identified. 4. Underlying  advanced chronic small vessel ischemia in the bilateral deep gray matter nuclei and brainstem. Electronically Signed   By: Genevie Ann M.D.   On: 03/12/2015 14:14    Assessment & Plan:   Marilyn Ford was seen today for diabetes, hyperlipidemia, hypertension and cerebrovascular accident.  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 -     clopidogrel (PLAVIX) 75 MG tablet; Take 1 tablet (75 mg total) by mouth daily. -     traZODone (DESYREL) 150 MG tablet; 1 or 2 at bedtime for sleep -     amLODipine (NORVASC) 10 MG tablet; TAKE ONE TABLET BY MOUTH ONCE DAILY FOR BLOOD PRESSURE -     metformin (FORTAMET) 1000 MG (OSM) 24 hr tablet; Take 1 tablet (1,000 mg total) by mouth daily with breakfast. -     metoprolol succinate (TOPROL-XL) 50 MG 24 hr tablet; TAKE ONE TABLET BY MOUTH ONCE DAILY TAKE WITH OR IMMEDIATELY FOLLOWING A MEAL -     valsartan-hydrochlorothiazide (DIOVAN-HCT) 320-25 MG tablet; Take 1 tablet by mouth daily.   I have changed Marilyn Ford's clopidogrel. I am also having her maintain her multivitamin, aspirin, atorvastatin, traZODone, amLODipine,  metformin, metoprolol succinate, and valsartan-hydrochlorothiazide.  Meds ordered this encounter  Medications  . atorvastatin (LIPITOR) 80 MG tablet    Sig: TAKE 1 TABLET DAILY AT 6 P.M.    Dispense:  90 tablet    Refill:  1  . clopidogrel (PLAVIX) 75 MG tablet    Sig: Take 1 tablet (75 mg total) by mouth daily.    Dispense:  90  tablet    Refill:  1  . traZODone (DESYREL) 150 MG tablet    Sig: 1 or 2 at bedtime for sleep    Dispense:  180 tablet    Refill:  1  . amLODipine (NORVASC) 10 MG tablet    Sig: TAKE ONE TABLET BY MOUTH ONCE DAILY FOR BLOOD PRESSURE    Dispense:  90 tablet    Refill:  3  . metformin (FORTAMET) 1000 MG (OSM) 24 hr tablet    Sig: Take 1 tablet (1,000 mg total) by mouth daily with breakfast.    Dispense:  90 tablet    Refill:  3  . metoprolol succinate (TOPROL-XL) 50 MG 24 hr tablet    Sig: TAKE ONE TABLET BY MOUTH ONCE DAILY TAKE WITH OR IMMEDIATELY FOLLOWING A MEAL    Dispense:  90 tablet    Refill:  3  . valsartan-hydrochlorothiazide (DIOVAN-HCT) 320-25 MG tablet    Sig: Take 1 tablet by mouth daily.    Dispense:  90 tablet    Refill:  3     Follow-up: Return in about 3 months (around 07/10/2016).  Claretta Fraise, M.D.

## 2016-04-12 LAB — CMP14+EGFR
ALT: 30 IU/L (ref 0–32)
AST: 17 IU/L (ref 0–40)
Albumin/Globulin Ratio: 1.6 (ref 1.2–2.2)
Albumin: 4.7 g/dL (ref 3.5–5.5)
Alkaline Phosphatase: 140 IU/L — ABNORMAL HIGH (ref 39–117)
BILIRUBIN TOTAL: 0.3 mg/dL (ref 0.0–1.2)
BUN/Creatinine Ratio: 14 (ref 9–23)
BUN: 13 mg/dL (ref 6–24)
CALCIUM: 9.7 mg/dL (ref 8.7–10.2)
CHLORIDE: 98 mmol/L (ref 96–106)
CO2: 26 mmol/L (ref 18–29)
Creatinine, Ser: 0.9 mg/dL (ref 0.57–1.00)
GFR, EST AFRICAN AMERICAN: 83 mL/min/{1.73_m2} (ref >59)
GFR, EST NON AFRICAN AMERICAN: 72 mL/min/{1.73_m2} (ref >59)
GLUCOSE: 89 mg/dL (ref 65–99)
Globulin, Total: 2.9 g/dL (ref 1.5–4.5)
Potassium: 4.2 mmol/L (ref 3.5–5.2)
Sodium: 141 mmol/L (ref 134–144)
TOTAL PROTEIN: 7.6 g/dL (ref 6.0–8.5)

## 2016-04-12 LAB — LIPID PANEL
CHOL/HDL RATIO: 3.7 ratio (ref 0.0–4.4)
Cholesterol, Total: 146 mg/dL (ref 100–199)
HDL: 39 mg/dL — ABNORMAL LOW (ref >39)
LDL Calculated: 56 mg/dL (ref 0–99)
Triglycerides: 257 mg/dL — ABNORMAL HIGH (ref 0–149)
VLDL Cholesterol Cal: 51 mg/dL — ABNORMAL HIGH (ref 5–40)

## 2016-04-12 LAB — MICROALBUMIN / CREATININE URINE RATIO
CREATININE, UR: 60.3 mg/dL
Microalb/Creat Ratio: <5 mg/g creat (ref 0.0–30.0)

## 2016-04-17 ENCOUNTER — Telehealth: Payer: Self-pay | Admitting: Family Medicine

## 2016-04-17 ENCOUNTER — Other Ambulatory Visit: Payer: Self-pay | Admitting: *Deleted

## 2016-04-17 MED ORDER — TRAZODONE HCL 150 MG PO TABS: ORAL_TABLET | ORAL | 1 refills | Status: DC

## 2016-04-17 NOTE — Telephone Encounter (Signed)
Pt notified RX sent into [facility] per request

## 2016-04-17 NOTE — Progress Notes (Signed)
RX sent into [facility] per pt request

## 2016-04-26 ENCOUNTER — Encounter: Payer: Self-pay | Admitting: *Deleted

## 2016-05-22 ENCOUNTER — Telehealth: Payer: Self-pay | Admitting: Family Medicine

## 2016-05-28 NOTE — Telephone Encounter (Signed)
Marilyn Ford states she has already spoken with [name] and she will get the questions to Dr Livia Snellen.

## 2016-05-28 NOTE — Telephone Encounter (Signed)
Please contact the patient to see what her questions are. Thanks, WS

## 2016-05-29 LAB — HM DIABETES EYE EXAM

## 2016-06-06 ENCOUNTER — Encounter: Payer: Self-pay | Admitting: *Deleted

## 2016-07-10 ENCOUNTER — Ambulatory Visit: Payer: Self-pay | Admitting: Family Medicine

## 2016-07-11 ENCOUNTER — Encounter: Payer: Self-pay | Admitting: Family Medicine

## 2016-07-11 ENCOUNTER — Telehealth: Payer: Self-pay | Admitting: Family Medicine

## 2016-07-24 ENCOUNTER — Ambulatory Visit (INDEPENDENT_AMBULATORY_CARE_PROVIDER_SITE_OTHER): Payer: 59 | Admitting: Family Medicine

## 2016-07-24 ENCOUNTER — Encounter: Payer: Self-pay | Admitting: Family Medicine

## 2016-07-24 VITALS — BP 135/78 | HR 65 | Temp 98.4°F | Ht 64.0 in | Wt 157.0 lb

## 2016-07-24 DIAGNOSIS — E1159 Type 2 diabetes mellitus with other circulatory complications: Secondary | ICD-10-CM

## 2016-07-24 DIAGNOSIS — E1165 Type 2 diabetes mellitus with hyperglycemia: Secondary | ICD-10-CM | POA: Diagnosis not present

## 2016-07-24 DIAGNOSIS — I10 Essential (primary) hypertension: Secondary | ICD-10-CM

## 2016-07-24 DIAGNOSIS — E1149 Type 2 diabetes mellitus with other diabetic neurological complication: Secondary | ICD-10-CM

## 2016-07-24 DIAGNOSIS — IMO0002 Reserved for concepts with insufficient information to code with codable children: Secondary | ICD-10-CM

## 2016-07-24 LAB — BAYER DCA HB A1C WAIVED: HB A1C (BAYER DCA - WAIVED): 6.8 % (ref <7.0)

## 2016-07-24 LAB — CMP14+EGFR
ALBUMIN: 4.5 g/dL (ref 3.5–5.5)
ALK PHOS: 141 IU/L — AB (ref 39–117)
ALT: 23 IU/L (ref 0–32)
AST: 18 IU/L (ref 0–40)
Albumin/Globulin Ratio: 1.6 (ref 1.2–2.2)
BUN / CREAT RATIO: 10 (ref 9–23)
BUN: 9 mg/dL (ref 6–24)
Bilirubin Total: 0.4 mg/dL (ref 0.0–1.2)
CALCIUM: 10.4 mg/dL — AB (ref 8.7–10.2)
CO2: 24 mmol/L (ref 18–29)
CREATININE: 0.89 mg/dL (ref 0.57–1.00)
Chloride: 101 mmol/L (ref 96–106)
GFR calc Af Amer: 84 mL/min/{1.73_m2} (ref >59)
GFR, EST NON AFRICAN AMERICAN: 73 mL/min/{1.73_m2} (ref >59)
GLUCOSE: 135 mg/dL — AB (ref 65–99)
Globulin, Total: 2.9 g/dL (ref 1.5–4.5)
Potassium: 4.3 mmol/L (ref 3.5–5.2)
Sodium: 143 mmol/L (ref 134–144)
Total Protein: 7.4 g/dL (ref 6.0–8.5)

## 2016-07-24 NOTE — Addendum Note (Signed)
Addended by: Liliane Bade on: 07/24/2016 10:48 AM   Modules accepted: Orders

## 2016-07-24 NOTE — Progress Notes (Signed)
Subjective:  Patient ID: Marilyn Ford, female    DOB: 1961-02-25  Age: 56 y.o. MRN: [id number]  CC: Hypertension (pt here today for routine follow up on her HTN. No other concerns voiced.)   HPI ORLI DEGRAVE presents for  follow-up of hypertension. Patient has no history of headache chest pain or shortness of breath or recent cough. Patient also denies symptoms of TIA such as numbness weakness lateralizing. Patient checks  blood pressure at home. Recent readings have been good Patient denies side effects from medication. States taking it regularly.  Patient also  in for follow-up of elevated cholesterol. Doing well without complaints on current medication. Denies side effects of statin including myalgia and arthralgia and nausea. Also in today for liver function testing. Currently no chest pain, shortness of breath or other cardiovascular related symptoms noted.  Follow-up of diabetes. Patient does check blood sugar at home. Readings run between 116 and 118 fasting & postprandial. Patient denies symptoms such as polyuria, polydipsia, excessive hunger, nausea No significant hypoglycemic spells noted. Medications reviewed. Pt reports taking them regularly. Pt. denies complication/adverse reaction today.    History Maylani has a past medical history of Diabetes mellitus without complication ([place]); Hypertension; and Stroke [facility]).   She has a past surgical history that includes Cholecystectomy and Cesarean section.   Her family history includes Diabetes in her brother and mother.She reports that she quit smoking about 16 months ago. Her smoking use included Cigarettes. She started smoking about 16 years ago. She smoked 0.50 packs per day. She has never used smokeless tobacco. She reports that she does not drink alcohol or use drugs.  Current Outpatient Prescriptions on File Prior to Visit  Medication Sig Dispense Refill  . amLODipine (NORVASC) 10 MG tablet TAKE ONE TABLET BY MOUTH ONCE  DAILY FOR BLOOD PRESSURE 90 tablet 3  . aspirin EC 81 MG EC tablet Take 1 tablet (81 mg total) by mouth daily. 30 tablet 0  . atorvastatin (LIPITOR) 80 MG tablet TAKE 1 TABLET DAILY AT 6 P.M. 90 tablet 1  . clopidogrel (PLAVIX) 75 MG tablet Take 1 tablet (75 mg total) by mouth daily. 90 tablet 1  . metformin (FORTAMET) 1000 MG (OSM) 24 hr tablet Take 1 tablet (1,000 mg total) by mouth daily with breakfast. 90 tablet 3  . metoprolol succinate (TOPROL-XL) 50 MG 24 hr tablet TAKE ONE TABLET BY MOUTH ONCE DAILY TAKE WITH OR IMMEDIATELY FOLLOWING A MEAL 90 tablet 3  . Multiple Vitamin (MULTIVITAMIN) capsule Take 1 capsule by mouth daily.    . traZODone (DESYREL) 150 MG tablet 1 or 2 at bedtime for sleep 180 tablet 1  . valsartan-hydrochlorothiazide (DIOVAN-HCT) 320-25 MG tablet Take 1 tablet by mouth daily. 90 tablet 3   No current facility-administered medications on file prior to visit.     ROS Review of Systems  Constitutional: Negative for activity change, appetite change and fever.  HENT: Negative for congestion, rhinorrhea and sore throat.   Eyes: Negative for visual disturbance.  Respiratory: Negative for cough and shortness of breath.   Cardiovascular: Negative for chest pain and palpitations.  Gastrointestinal: Negative for abdominal pain, diarrhea and nausea.  Genitourinary: Negative for dysuria.  Musculoskeletal: Negative for arthralgias and myalgias.    Objective:  BP 135/78   Pulse 65   Temp 98.4 F (36.9 C) (Oral)   Ht 5\' 4"  (1.626 m)   Wt 157 lb (71.2 kg)   BMI 26.95 kg/m   BP Readings from Last 3  Encounters:  07/24/16 135/78  04/11/16 132/86  12/26/15 (!) 146/78    Wt Readings from Last 3 Encounters:  07/24/16 157 lb (71.2 kg)  04/11/16 153 lb 6.4 oz (69.6 kg)  12/26/15 151 lb 9.6 oz (68.8 kg)     Physical Exam  Constitutional: She is oriented to person, place, and time. She appears well-developed and well-nourished. No distress.  HENT:  Head:  Normocephalic and atraumatic.  Right Ear: External ear normal.  Left Ear: External ear normal.  Nose: Nose normal.  Mouth/Throat: Oropharynx is clear and moist.  Eyes: Conjunctivae and EOM are normal. Pupils are equal, round, and reactive to light.  Neck: Normal range of motion. Neck supple. No thyromegaly present.  Cardiovascular: Normal rate, regular rhythm and normal heart sounds.   No murmur heard. Pulmonary/Chest: Effort normal and breath sounds normal. No respiratory distress. She has no wheezes. She has no rales.  Abdominal: Soft. Bowel sounds are normal. She exhibits no distension. There is no tenderness.  Lymphadenopathy:    She has no cervical adenopathy.  Neurological: She is alert and oriented to person, place, and time. She has normal reflexes.  Skin: Skin is warm and dry.  Psychiatric: She has a normal mood and affect. Her behavior is normal. Judgment and thought content normal.    MMSE score is 27. Pt. Having trouble with simple math & recall.    Assessment & Plan:   Kennya was seen today for hypertension.  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 Kirt maintain her multivitamin, aspirin, atorvastatin, clopidogrel, amLODipine, metformin, metoprolol succinate, valsartan-hydrochlorothiazide, and traZODone.  No orders of the defined types were placed in this encounter.    Follow-up: Return in about 3 months (around 10/24/2016).  Claretta Fraise, M.D.

## 2016-08-16 NOTE — Telephone Encounter (Signed)
Pt was seen 07/24/16 with Dr. Livia Snellen.

## 2016-09-24 DIAGNOSIS — Z0289 Encounter for other administrative examinations: Secondary | ICD-10-CM

## 2016-10-18 ENCOUNTER — Telehealth: Payer: Self-pay | Admitting: Family Medicine

## 2016-10-18 NOTE — Telephone Encounter (Signed)
Left message that records would be done today.

## 2016-10-20 ENCOUNTER — Other Ambulatory Visit: Payer: Self-pay | Admitting: Family Medicine

## 2016-10-26 ENCOUNTER — Encounter: Payer: Self-pay | Admitting: Family Medicine

## 2016-10-26 ENCOUNTER — Ambulatory Visit (INDEPENDENT_AMBULATORY_CARE_PROVIDER_SITE_OTHER): Payer: 59 | Admitting: Family Medicine

## 2016-10-26 VITALS — BP 112/63 | HR 65 | Temp 98.0°F | Ht 64.0 in | Wt 154.0 lb

## 2016-10-26 DIAGNOSIS — I69351 Hemiplegia and hemiparesis following cerebral infarction affecting right dominant side: Secondary | ICD-10-CM

## 2016-10-26 DIAGNOSIS — I10 Essential (primary) hypertension: Secondary | ICD-10-CM | POA: Diagnosis not present

## 2016-10-26 DIAGNOSIS — E782 Mixed hyperlipidemia: Secondary | ICD-10-CM | POA: Diagnosis not present

## 2016-10-26 DIAGNOSIS — R748 Abnormal levels of other serum enzymes: Secondary | ICD-10-CM | POA: Diagnosis not present

## 2016-10-26 DIAGNOSIS — E1159 Type 2 diabetes mellitus with other circulatory complications: Secondary | ICD-10-CM

## 2016-10-26 LAB — URINALYSIS
BILIRUBIN UA: NEGATIVE
GLUCOSE, UA: NEGATIVE
LEUKOCYTES UA: NEGATIVE
Nitrite, UA: NEGATIVE
PH UA: 6 (ref 5.0–7.5)
PROTEIN UA: NEGATIVE
RBC, UA: NEGATIVE
Specific Gravity, UA: 1.02 (ref 1.005–1.030)
Urobilinogen, Ur: 0.2 mg/dL (ref 0.2–1.0)

## 2016-10-26 LAB — BAYER DCA HB A1C WAIVED: HB A1C: 6.5 % (ref <7.0)

## 2016-10-26 NOTE — Progress Notes (Signed)
Subjective:  Patient ID: Marilyn Ford,  female    DOB: 29-Nov-1960  Age: 56 y.o.    CC: Diabetes (pt here today for routine follow up of her chronic medical conditions)   HPI CHERIS TWETEN presents for  follow-up of hypertension. Patient has no history of headache chest pain or shortness of breath or recent cough. Patient also denies symptoms of TIA such as numbness weakness lateralizing. Patient checks  blood pressure at home. Recent readings have been good Patient denies side effects from medication. States taking it regularly.  Patient also  in for follow-up of elevated cholesterol. Doing well without complaints on current medication. Denies side effects of statin including myalgia and arthralgia and nausea. Also in today for liver function testing. Currently no chest pain, shortness of breath or other cardiovascular related symptoms noted.  Follow-up of diabetes. Patient does check blood sugar at home. Readings run between 80-120 fasting and 145-165 post-prandial Patient denies symptoms such as polyuria, polydipsia, excessive hunger, nausea No significant hypoglycemic spells noted. Medications reviewed. Pt reports taking them regularly. Pt. denies complication/adverse reaction today.    History Roisin has a past medical history of Diabetes mellitus without complication ([place]); Hypertension; and Stroke [facility]).   She has a past surgical history that includes Cholecystectomy and Cesarean section.   Her family history includes Diabetes in her brother and mother.She reports that she quit smoking about 19 months ago. Her smoking use included Cigarettes. She started smoking about 16 years ago. She smoked 0.50 packs per day. She has never used smokeless tobacco. She reports that she does not drink alcohol or use drugs.  Current Outpatient Prescriptions on File Prior to Visit  Medication Sig Dispense Refill  . amLODipine (NORVASC) 10 MG tablet TAKE ONE TABLET BY MOUTH ONCE DAILY FOR  BLOOD PRESSURE 90 tablet 3  . aspirin EC 81 MG EC tablet Take 1 tablet (81 mg total) by mouth daily. 30 tablet 0  . atorvastatin (LIPITOR) 80 MG tablet TAKE 1 TABLET DAILY AT 6 P.M. 90 tablet 0  . clopidogrel (PLAVIX) 75 MG tablet TAKE 1 TABLET DAILY 90 tablet 0  . metformin (FORTAMET) 1000 MG (OSM) 24 hr tablet Take 1 tablet (1,000 mg total) by mouth daily with breakfast. 90 tablet 3  . metoprolol succinate (TOPROL-XL) 50 MG 24 hr tablet TAKE ONE TABLET BY MOUTH ONCE DAILY TAKE WITH OR IMMEDIATELY FOLLOWING A MEAL 90 tablet 3  . Multiple Vitamin (MULTIVITAMIN) capsule Take 1 capsule by mouth daily.    . traZODone (DESYREL) 150 MG tablet 1 or 2 at bedtime for sleep 180 tablet 1  . valsartan-hydrochlorothiazide (DIOVAN-HCT) 320-25 MG tablet Take 1 tablet by mouth daily. 90 tablet 3   No current facility-administered medications on file prior to visit.     ROS Review of Systems  Constitutional: Negative for activity change, appetite change and fever.  HENT: Negative for congestion, rhinorrhea and sore throat.   Eyes: Negative for visual disturbance.  Respiratory: Negative for cough and shortness of breath.   Cardiovascular: Negative for chest pain and palpitations.  Gastrointestinal: Negative for abdominal pain, diarrhea and nausea.  Genitourinary: Negative for dysuria.  Musculoskeletal: Negative for arthralgias and myalgias.    Objective:  BP 112/63   Pulse 65   Temp 98 F (36.7 C) (Oral)   Ht '5\' 4"'  (1.626 m)   Wt 154 lb (69.9 kg)   BMI 26.43 kg/m   BP Readings from Last 3 Encounters:  10/26/16 112/63  07/24/16 135/78  04/11/16 132/86    Wt Readings from Last 3 Encounters:  10/26/16 154 lb (69.9 kg)  07/24/16 157 lb (71.2 kg)  04/11/16 153 lb 6.4 oz (69.6 kg)     Physical Exam  Constitutional: She is oriented to person, place, and time. She appears well-developed and well-nourished. No distress.  HENT:  Head: Normocephalic and atraumatic.  Right Ear: External ear  normal.  Left Ear: External ear normal.  Nose: Nose normal.  Mouth/Throat: Oropharynx is clear and moist.  Eyes: Conjunctivae and EOM are normal. Pupils are equal, round, and reactive to light.  Neck: Normal range of motion. Neck supple. No thyromegaly present.  Cardiovascular: Normal rate, regular rhythm and normal heart sounds.   No murmur heard. Pulmonary/Chest: Effort normal and breath sounds normal. No respiratory distress. She has no wheezes. She has no rales.  Abdominal: Soft. Bowel sounds are normal. She exhibits no distension. There is no tenderness.  Lymphadenopathy:    She has no cervical adenopathy.  Neurological: She is alert and oriented to person, place, and time. She has normal reflexes. Abnormal coordination: mild right hemiparesis leading to imbalance and weakness for ambulation.  Skin: Skin is warm and dry.  Psychiatric: She has a normal mood and affect. Her behavior is normal. Judgment and thought content normal.    Diabetic Foot Exam - Simple   No data filed        Assessment & Plan:   Arthea was seen today for diabetes.  Diagnoses and all orders for this visit:  Controlled type 2 diabetes mellitus with other circulatory complication, without long-term current use of insulin (HCC) -     CBC with Differential/Platelet -     CMP14+EGFR -     Microalbumin / creatinine urine ratio -     Urinalysis -     Bayer DCA Hb A1c Waived -     Lipid panel  Hemiparesis affecting right side as late effect of cerebrovascular accident (CVA) ([facility]) -     CMP14+EGFR  Essential hypertension -     CMP14+EGFR  Mixed hyperlipidemia -     CMP14+EGFR -     Lipid panel   I am having Ms. Goins maintain her multivitamin, aspirin, amLODipine, metformin, metoprolol succinate, valsartan-hydrochlorothiazide, traZODone, atorvastatin, and clopidogrel.  No orders of the defined types were placed in this encounter.    Follow-up: Return in about 3 months (around  01/26/2017).  Claretta Fraise, M.D.

## 2016-10-27 LAB — MICROALBUMIN / CREATININE URINE RATIO
CREATININE, UR: 161.9 mg/dL
Microalb/Creat Ratio: <1.9 mg/g creat (ref 0.0–30.0)
Microalbumin, Urine: <3 ug/mL

## 2016-10-27 LAB — CBC WITH DIFFERENTIAL/PLATELET
BASOS: 1 %
Basophils Absolute: 0.1 10*3/uL (ref 0.0–0.2)
EOS (ABSOLUTE): 0.2 10*3/uL (ref 0.0–0.4)
Eos: 2 %
HEMATOCRIT: 38.1 % (ref 34.0–46.6)
Hemoglobin: 12 g/dL (ref 11.1–15.9)
Immature Grans (Abs): 0 10*3/uL (ref 0.0–0.1)
Immature Granulocytes: 0 %
LYMPHS ABS: 1.8 10*3/uL (ref 0.7–3.1)
Lymphs: 20 %
MCH: 29.8 pg (ref 26.6–33.0)
MCHC: 31.5 g/dL (ref 31.5–35.7)
MCV: 95 fL (ref 79–97)
MONOS ABS: 0.6 10*3/uL (ref 0.1–0.9)
Monocytes: 6 %
Neutrophils Absolute: 6.2 10*3/uL (ref 1.4–7.0)
Neutrophils: 71 %
Platelets: 322 10*3/uL (ref 150–379)
RBC: 4.03 x10E6/uL (ref 3.77–5.28)
RDW: 14 % (ref 12.3–15.4)
WBC: 8.8 10*3/uL (ref 3.4–10.8)

## 2016-10-27 LAB — LIPID PANEL
CHOL/HDL RATIO: 3.9 ratio (ref 0.0–4.4)
Cholesterol, Total: 138 mg/dL (ref 100–199)
HDL: 35 mg/dL — AB (ref >39)
LDL CALC: 59 mg/dL (ref 0–99)
Triglycerides: 222 mg/dL — ABNORMAL HIGH (ref 0–149)
VLDL CHOLESTEROL CAL: 44 mg/dL — AB (ref 5–40)

## 2016-10-27 LAB — CMP14+EGFR
A/G RATIO: 1.2 (ref 1.2–2.2)
ALBUMIN: 4.1 g/dL (ref 3.5–5.5)
ALK PHOS: 183 IU/L — AB (ref 39–117)
ALT: 33 IU/L — ABNORMAL HIGH (ref 0–32)
AST: 21 IU/L (ref 0–40)
BUN / CREAT RATIO: 10 (ref 9–23)
BUN: 10 mg/dL (ref 6–24)
Bilirubin Total: 0.3 mg/dL (ref 0.0–1.2)
CALCIUM: 10 mg/dL (ref 8.7–10.2)
CO2: 21 mmol/L (ref 20–29)
Chloride: 100 mmol/L (ref 96–106)
Creatinine, Ser: 1 mg/dL (ref 0.57–1.00)
GFR calc Af Amer: 73 mL/min/{1.73_m2} (ref >59)
GFR, EST NON AFRICAN AMERICAN: 64 mL/min/{1.73_m2} (ref >59)
GLOBULIN, TOTAL: 3.5 g/dL (ref 1.5–4.5)
Glucose: 124 mg/dL — ABNORMAL HIGH (ref 65–99)
POTASSIUM: 4.9 mmol/L (ref 3.5–5.2)
SODIUM: 142 mmol/L (ref 134–144)
Total Protein: 7.6 g/dL (ref 6.0–8.5)

## 2016-10-30 ENCOUNTER — Encounter: Payer: Self-pay | Admitting: *Deleted

## 2016-10-30 NOTE — Addendum Note (Signed)
Addended by: Thana Ates on: 10/30/2016 11:16 AM   Modules accepted: Orders

## 2016-10-31 ENCOUNTER — Telehealth: Payer: Self-pay | Admitting: Family Medicine

## 2016-11-01 NOTE — Telephone Encounter (Signed)
Pt aware of results 

## 2016-11-14 ENCOUNTER — Telehealth: Payer: Self-pay | Admitting: *Deleted

## 2016-11-14 NOTE — Telephone Encounter (Signed)
neurologist called requesting peer to peer for disability Please call [phone]

## 2016-11-19 NOTE — Telephone Encounter (Signed)
Discussed with Dr. Araceli Bouche - disabled due to R hemiparesis

## 2016-11-27 LAB — HM DIABETES EYE EXAM

## 2016-12-03 ENCOUNTER — Other Ambulatory Visit: Payer: Self-pay

## 2016-12-04 LAB — HEPATIC FUNCTION PANEL
ALBUMIN: 4 g/dL (ref 3.5–5.5)
ALK PHOS: 118 IU/L — AB (ref 39–117)
ALT: 20 IU/L (ref 0–32)
AST: 17 IU/L (ref 0–40)
BILIRUBIN TOTAL: 0.3 mg/dL (ref 0.0–1.2)
Bilirubin, Direct: 0.1 mg/dL (ref 0.00–0.40)
Total Protein: 7.2 g/dL (ref 6.0–8.5)

## 2016-12-11 ENCOUNTER — Telehealth: Payer: Self-pay | Admitting: *Deleted

## 2016-12-11 NOTE — Telephone Encounter (Addendum)
Patients son is incarcerated She is needing letter written again to get his placement changed d/t to her health problems and inability to ride long distances for visitation. See last letter written 08/01/15

## 2016-12-17 NOTE — Telephone Encounter (Signed)
Please  write and I will sign. Thanks, WS 

## 2016-12-17 NOTE — Telephone Encounter (Signed)
Letter written and left up front for patient pick up. Patient notified

## 2017-01-18 ENCOUNTER — Other Ambulatory Visit: Payer: Self-pay | Admitting: Family Medicine

## 2017-01-28 ENCOUNTER — Telehealth: Payer: Self-pay | Admitting: Family Medicine

## 2017-01-28 ENCOUNTER — Encounter (INDEPENDENT_AMBULATORY_CARE_PROVIDER_SITE_OTHER): Payer: Self-pay | Admitting: *Deleted

## 2017-01-28 ENCOUNTER — Encounter: Payer: Self-pay | Admitting: Family Medicine

## 2017-01-28 ENCOUNTER — Ambulatory Visit (INDEPENDENT_AMBULATORY_CARE_PROVIDER_SITE_OTHER): Payer: 59 | Admitting: Family Medicine

## 2017-01-28 VITALS — BP 111/69 | HR 73 | Temp 96.9°F | Ht 64.0 in | Wt 155.0 lb

## 2017-01-28 DIAGNOSIS — Z1211 Encounter for screening for malignant neoplasm of colon: Secondary | ICD-10-CM | POA: Diagnosis not present

## 2017-01-28 DIAGNOSIS — E782 Mixed hyperlipidemia: Secondary | ICD-10-CM

## 2017-01-28 DIAGNOSIS — E1159 Type 2 diabetes mellitus with other circulatory complications: Secondary | ICD-10-CM | POA: Diagnosis not present

## 2017-01-28 DIAGNOSIS — I10 Essential (primary) hypertension: Secondary | ICD-10-CM

## 2017-01-28 LAB — BAYER DCA HB A1C WAIVED: HB A1C: 7 % — AB (ref <7.0)

## 2017-01-28 MED ORDER — METOPROLOL SUCCINATE ER 50 MG PO TB24: ORAL_TABLET | ORAL | 3 refills | Status: DC

## 2017-01-28 MED ORDER — AMLODIPINE BESYLATE 10 MG PO TABS: ORAL_TABLET | ORAL | 3 refills | Status: DC

## 2017-01-28 MED ORDER — ATORVASTATIN CALCIUM 80 MG PO TABS: ORAL_TABLET | ORAL | 1 refills | Status: DC

## 2017-01-28 MED ORDER — CLOPIDOGREL BISULFATE 75 MG PO TABS: 75.0000 mg | ORAL_TABLET | Freq: Every day | ORAL | 1 refills | Status: DC

## 2017-01-28 MED ORDER — TRAZODONE HCL 150 MG PO TABS: ORAL_TABLET | ORAL | 1 refills | Status: DC

## 2017-01-28 MED ORDER — VALSARTAN-HYDROCHLOROTHIAZIDE 320-25 MG PO TABS: 1.0000 | ORAL_TABLET | Freq: Every day | ORAL | 3 refills | Status: DC

## 2017-01-28 MED ORDER — METFORMIN HCL ER (OSM) 1000 MG PO TB24: 1000.0000 mg | ORAL_TABLET | Freq: Every day | ORAL | 3 refills | Status: DC

## 2017-01-28 NOTE — Progress Notes (Signed)
Subjective:  Patient ID: Marilyn Ford,  female    DOB: October 05, 1960  Age: 56 y.o.    CC: Diabetes (pt here today for routine follow up of her chronic medical conditions and refills on her medications)   HPI JULIANNAH OHMANN presents for  follow-up of hypertension. Patient has no history of headache chest pain or shortness of breath or recent cough. Patient also denies symptoms of TIA such as numbness weakness lateralizing. Patient checks  blood pressure at home. Recent readings have been good Patient denies side effects from medication. States taking it regularly.  Patient also  in for follow-up of elevated cholesterol. Doing well without complaints on current medication. Denies side effects of statin including myalgia and arthralgia and nausea. Also in today for liver function testing. Currently no chest pain, shortness of breath or other cardiovascular related symptoms noted.  Follow-up of diabetes. Patient does not check blood sugar at home regularly.(Changes the subject repeatedly when asked.) Patient denies symptoms such as polyuria, polydipsia, excessive hunger, nausea No significant hypoglycemic spells noted. Medications reviewed. Pt reports taking them regularly. Pt. denies complication/adverse reaction today.    History Kyiah has a past medical history of Diabetes mellitus without complication ([place]); Hypertension; and Stroke [facility]).   She has a past surgical history that includes Cholecystectomy and Cesarean section.   Her family history includes Diabetes in her brother and mother.She reports that she quit smoking about 22 months ago. Her smoking use included Cigarettes. She started smoking about 17 years ago. She smoked 0.50 packs per day. She has never used smokeless tobacco. She reports that she does not drink alcohol or use drugs.  Current Outpatient Prescriptions on File Prior to Visit  Medication Sig Dispense Refill  . aspirin EC 81 MG EC tablet Take 1 tablet (81 mg  total) by mouth daily. 30 tablet 0  . Multiple Vitamin (MULTIVITAMIN) capsule Take 1 capsule by mouth daily.     No current facility-administered medications on file prior to visit.     ROS Review of Systems  Constitutional: Negative for activity change, appetite change and fever.  HENT: Negative for congestion, rhinorrhea and sore throat.   Eyes: Negative for visual disturbance.  Respiratory: Negative for cough and shortness of breath.   Cardiovascular: Positive for leg swelling (RLE with sitting prolonged times - riding , e.g.). Negative for chest pain and palpitations.  Gastrointestinal: Negative for abdominal pain, diarrhea and nausea.  Genitourinary: Negative for dysuria.  Musculoskeletal: Negative for arthralgias and myalgias.  Neurological: Positive for weakness (RLE feels heavy, increasing when tired.).    Objective:  BP 111/69   Pulse 73   Temp (!) 96.9 F (36.1 C) (Oral)   Ht 5\' 4"  (1.626 m)   Wt 155 lb (70.3 kg)   BMI 26.61 kg/m   BP Readings from Last 3 Encounters:  01/28/17 111/69  10/26/16 112/63  07/24/16 135/78    Wt Readings from Last 3 Encounters:  01/28/17 155 lb (70.3 kg)  10/26/16 154 lb (69.9 kg)  07/24/16 157 lb (71.2 kg)     Physical Exam  Constitutional: She is oriented to person, place, and time. She appears well-developed and well-nourished. No distress.  HENT:  Head: Normocephalic and atraumatic.  Right Ear: External ear normal.  Left Ear: External ear normal.  Nose: Nose normal.  Mouth/Throat: Oropharynx is clear and moist.  Eyes: Pupils are equal, round, and reactive to light. Conjunctivae and EOM are normal.  Neck: Normal range of motion. Neck supple.  No thyromegaly present.  Cardiovascular: Normal rate, regular rhythm and normal heart sounds.   No murmur heard. Pulmonary/Chest: Effort normal and breath sounds normal. No respiratory distress. She has no wheezes. She has no rales.  Abdominal: Soft. Bowel sounds are normal. She  exhibits no distension. There is no tenderness.  Lymphadenopathy:    She has no cervical adenopathy.  Neurological: She is alert and oriented to person, place, and time. She has normal reflexes.  Skin: Skin is warm and dry.  Psychiatric: She has a normal mood and affect. Her behavior is normal. Judgment and thought content normal.    Diabetic Foot Exam - Simple   Simple Foot Form Diabetic Foot exam was performed with the following findings:  Yes 01/28/2017  8:23 AM  Visual Inspection No deformities, no ulcerations, no other skin breakdown bilaterally:  Yes Sensation Testing Intact to touch and monofilament testing bilaterally:  Yes Pulse Check Posterior Tibialis and Dorsalis pulse intact bilaterally:  Yes Comments       Assessment & Plan:   Teigan was seen today for diabetes.  Diagnoses and all orders for this visit:  Controlled type 2 diabetes mellitus with other circulatory complication, without long-term current use of insulin (HCC)  Essential hypertension  Mixed hyperlipidemia  Screening for colon cancer  Other orders -     amLODipine (NORVASC) 10 MG tablet; TAKE ONE TABLET BY MOUTH ONCE DAILY FOR BLOOD PRESSURE -     atorvastatin (LIPITOR) 80 MG tablet; TAKE 1 TABLET DAILY AT 6 P.M. -     clopidogrel (PLAVIX) 75 MG tablet; Take 1 tablet (75 mg total) by mouth daily. -     metformin (FORTAMET) 1000 MG (OSM) 24 hr tablet; Take 1 tablet (1,000 mg total) by mouth daily with breakfast. -     metoprolol succinate (TOPROL-XL) 50 MG 24 hr tablet; TAKE ONE TABLET BY MOUTH ONCE DAILY TAKE WITH OR IMMEDIATELY FOLLOWING A MEAL -     valsartan-hydrochlorothiazide (DIOVAN-HCT) 320-25 MG tablet; Take 1 tablet by mouth daily. -     traZODone (DESYREL) 150 MG tablet; 1 or 2 at bedtime for sleep   I have changed Ms. Cappelletti's clopidogrel. I am also having her maintain her multivitamin, aspirin, amLODipine, atorvastatin, metformin, metoprolol succinate, valsartan-hydrochlorothiazide,  and traZODone.  Meds ordered this encounter  Medications  . amLODipine (NORVASC) 10 MG tablet    Sig: TAKE ONE TABLET BY MOUTH ONCE DAILY FOR BLOOD PRESSURE    Dispense:  90 tablet    Refill:  3  . atorvastatin (LIPITOR) 80 MG tablet    Sig: TAKE 1 TABLET DAILY AT 6 P.M.    Dispense:  90 tablet    Refill:  1  . clopidogrel (PLAVIX) 75 MG tablet    Sig: Take 1 tablet (75 mg total) by mouth daily.    Dispense:  90 tablet    Refill:  1  . metformin (FORTAMET) 1000 MG (OSM) 24 hr tablet    Sig: Take 1 tablet (1,000 mg total) by mouth daily with breakfast.    Dispense:  90 tablet    Refill:  3  . metoprolol succinate (TOPROL-XL) 50 MG 24 hr tablet    Sig: TAKE ONE TABLET BY MOUTH ONCE DAILY TAKE WITH OR IMMEDIATELY FOLLOWING A MEAL    Dispense:  90 tablet    Refill:  3  . valsartan-hydrochlorothiazide (DIOVAN-HCT) 320-25 MG tablet    Sig: Take 1 tablet by mouth daily.    Dispense:  90 tablet  Refill:  3  . traZODone (DESYREL) 150 MG tablet    Sig: 1 or 2 at bedtime for sleep    Dispense:  180 tablet    Refill:  1   Pt. Stable for all treated conditions. Remains weak on right side.  Follow-up: Return in about 3 months (around 04/29/2017).  Claretta Fraise, M.D.

## 2017-01-29 LAB — CBC WITH DIFFERENTIAL/PLATELET
BASOS: 1 %
Basophils Absolute: 0 10*3/uL (ref 0.0–0.2)
EOS (ABSOLUTE): 0.2 10*3/uL (ref 0.0–0.4)
EOS: 3 %
HEMATOCRIT: 37.4 % (ref 34.0–46.6)
Hemoglobin: 12.6 g/dL (ref 11.1–15.9)
IMMATURE GRANS (ABS): 0 10*3/uL (ref 0.0–0.1)
IMMATURE GRANULOCYTES: 0 %
Lymphocytes Absolute: 1.5 10*3/uL (ref 0.7–3.1)
Lymphs: 22 %
MCH: 29.5 pg (ref 26.6–33.0)
MCHC: 33.7 g/dL (ref 31.5–35.7)
MCV: 88 fL (ref 79–97)
MONOS ABS: 0.4 10*3/uL (ref 0.1–0.9)
Monocytes: 6 %
NEUTROS PCT: 68 %
Neutrophils Absolute: 4.9 10*3/uL (ref 1.4–7.0)
Platelets: 306 10*3/uL (ref 150–379)
RBC: 4.27 x10E6/uL (ref 3.77–5.28)
RDW: 13 % (ref 12.3–15.4)
WBC: 7 10*3/uL (ref 3.4–10.8)

## 2017-01-29 LAB — CMP14+EGFR
A/G RATIO: 1.4 (ref 1.2–2.2)
ALT: 25 IU/L (ref 0–32)
AST: 22 IU/L (ref 0–40)
Albumin: 4.2 g/dL (ref 3.5–5.5)
Alkaline Phosphatase: 133 IU/L — ABNORMAL HIGH (ref 39–117)
BUN / CREAT RATIO: 12 (ref 9–23)
BUN: 13 mg/dL (ref 6–24)
Bilirubin Total: 0.4 mg/dL (ref 0.0–1.2)
CALCIUM: 10 mg/dL (ref 8.7–10.2)
CO2: 23 mmol/L (ref 20–29)
Chloride: 100 mmol/L (ref 96–106)
Creatinine, Ser: 1.13 mg/dL — ABNORMAL HIGH (ref 0.57–1.00)
GFR, EST AFRICAN AMERICAN: 63 mL/min/{1.73_m2} (ref >59)
GFR, EST NON AFRICAN AMERICAN: 54 mL/min/{1.73_m2} — AB (ref >59)
GLOBULIN, TOTAL: 3.1 g/dL (ref 1.5–4.5)
Glucose: 139 mg/dL — ABNORMAL HIGH (ref 65–99)
Potassium: 4.3 mmol/L (ref 3.5–5.2)
SODIUM: 140 mmol/L (ref 134–144)
TOTAL PROTEIN: 7.3 g/dL (ref 6.0–8.5)

## 2017-01-29 NOTE — Telephone Encounter (Signed)
Pt aware of results 

## 2017-01-30 ENCOUNTER — Ambulatory Visit (INDEPENDENT_AMBULATORY_CARE_PROVIDER_SITE_OTHER): Payer: 59

## 2017-01-30 DIAGNOSIS — Z23 Encounter for immunization: Secondary | ICD-10-CM | POA: Diagnosis not present

## 2017-02-04 ENCOUNTER — Telehealth: Payer: Self-pay | Admitting: Family Medicine

## 2017-02-05 ENCOUNTER — Telehealth: Payer: Self-pay | Admitting: Family Medicine

## 2017-02-05 NOTE — Telephone Encounter (Signed)
lmtcb

## 2017-02-06 ENCOUNTER — Encounter: Payer: Self-pay | Admitting: Family Medicine

## 2017-02-06 ENCOUNTER — Ambulatory Visit (INDEPENDENT_AMBULATORY_CARE_PROVIDER_SITE_OTHER): Payer: 59 | Admitting: Family Medicine

## 2017-02-06 VITALS — BP 105/70 | HR 81 | Temp 98.2°F | Ht 64.0 in | Wt 152.0 lb

## 2017-02-06 DIAGNOSIS — B029 Zoster without complications: Secondary | ICD-10-CM | POA: Diagnosis not present

## 2017-02-06 MED ORDER — GABAPENTIN 300 MG PO CAPS: 300.0000 mg | ORAL_CAPSULE | Freq: Two times a day (BID) | ORAL | 1 refills | Status: DC

## 2017-02-06 NOTE — Telephone Encounter (Signed)
Aware, needs an appointment. 

## 2017-02-06 NOTE — Telephone Encounter (Signed)
Patient aware , will need to schedule an appointment.

## 2017-02-06 NOTE — Patient Instructions (Signed)
You are outside of the window for antiviral treatment. However, I will treat your neuropathic pain associated with the shingles infection. I recommend that you consider starting out with Tylenol for pain. If this is not effective, proceed with taking the gabapentin that I prescribed to you. Start off taking 1 capsule at bedtime for 3 days. If you are not very sleepy off of this medication, you may increase to 1 capsule 2 times a day. Follow up with Dr. Livia Snellen if you're having persistent pain despite medication. As we discussed, make sure that you're skin lesions are covered and that you avoid contact with people that have a low immune system until all of your rash has crusted over.  Shingles Shingles is an infection that causes a painful skin rash and fluid-filled blisters. Shingles is caused by the same virus that causes chickenpox. Shingles only develops in people who:  Have had chickenpox.  Have gotten the chickenpox vaccine. (This is rare.)  The first symptoms of shingles may be itching, tingling, or pain in an area on your skin. A rash will follow in a few days or weeks. The rash is usually on one side of the body in a bandlike or beltlike pattern. Over time, the rash turns into fluid-filled blisters that break open, scab over, and dry up. Medicines may:  Help you manage pain.  Help you recover more quickly.  Help to prevent long-term problems.  Follow these instructions at home: Medicines  Take medicines only as told by your doctor.  Apply an anti-itch or numbing cream to the affected area as told by your doctor. Blister and Rash Care  Take a cool bath or put cool compresses on the area of the rash or blisters as told by your doctor. This may help with pain and itching.  Keep your rash covered with a loose bandage (dressing). Wear loose-fitting clothing.  Keep your rash and blisters clean with mild soap and cool water or as told by your doctor.  Check your rash every day for  signs of infection. These include redness, swelling, and pain that lasts or gets worse.  Do not pick your blisters.  Do not scratch your rash. General instructions  Rest as told by your doctor.  Keep all follow-up visits as told by your doctor. This is important.  Until your blisters scab over, your infection can cause chickenpox in people who have never had it or been vaccinated against it. To prevent this from happening, avoid touching other people or being around other people, especially: ? Babies. ? Pregnant women. ? Children who have eczema. ? Elderly people who have transplants. ? People who have chronic illnesses, such as leukemia or AIDS. Contact a doctor if:  Your pain does not get better with medicine.  Your pain does not get better after the rash heals.  Your rash looks infected. Signs of infection include: ? Redness. ? Swelling. ? Pain that lasts or gets worse. Get help right away if:  The rash is on your face or nose.  You have pain in your face, pain around your eye area, or loss of feeling on one side of your face.  You have ear pain or you have ringing in your ear.  You have loss of taste.  Your condition gets worse. This information is not intended to replace advice given to you by your health care provider. Make sure you discuss any questions you have with your health care provider. Document Released: 10/10/2007 Document Revised: 12/18/2015  Document Reviewed: 02/02/2014 Elsevier Interactive Patient Education  2018 [employer]. Postherpetic Neuralgia Postherpetic neuralgia (PHN) is nerve pain that occurs after a shingles infection. Shingles is a painful rash that appears on one side of the body, usually on your trunk or face. Shingles is caused by the varicella-zoster virus. This is the same virus that causes chickenpox. In people who have had chickenpox, the virus can resurface years later and cause shingles. You may have PHN if you continue to have pain  for 3 months after your shingles rash has gone away. PHN appears in the same area where you had the shingles rash. For most people, PHN goes away within 1 year. Getting a vaccination for shingles can prevent PHN. This vaccine is recommended for people older than 50. It may prevent shingles and may also lower your risk of PHN if you do get shingles. What are the causes? PHN is caused by damage to your nerves from the varicella-zoster virus. This damage makes your nerves overly sensitive. What increases the risk? Aging is the biggest risk factor for developing PHN. Most people who get PHN are older than 32. Other risk factors include:  Having very bad pain before your shingles rash starts.  Having a very bad rash.  Having shingles in the nerve that supplies your face and eye (trigeminal nerve).  What are the signs or symptoms? Pain is the main symptom of PHN. The pain is often very bad and may be described as stabbing, burning, or feeling like an electric shock. The pain may come and go or may be there all the time. Pain may be triggered by light touches on the skin or changes in temperature. You may have itching along with the pain. How is this diagnosed? Your health care provider may diagnose PHN based on your symptoms and your history of shingles. Lab studies and other diagnostic tests are usually not needed. How is this treated? There is no cure for PHN. Treatment for PHN will focus on pain relief. Over-the-counter pain relievers do not usually relieve PHN pain. You may need to work with a pain specialist. Treatment may include:  Antidepressant medicines to help with pain and improve sleep.  Antiseizure medicines to relieve nerve pain.  Strong pain relievers (opioids).  A numbing patch worn on the skin (lidocaine patch).  Follow these instructions at home: It may take a long time to recover from PHN. Work closely with your health care provider, and have a good support system at  home.  Take all medicines as directed by your health care provider.  Wear loose, comfortable clothing.  Cover sensitive areas with a dressing to reduce friction from clothing rubbing on the area.  If cold does not make your pain worse, try applying a cool compress or cooling gel pack to the area.  Talk to your health care provider if you feel depressed or desperate. Living with long-term pain can be depressing.  Contact a health care provider if:  Your medicine is not helping.  You are struggling to manage your pain at home. This information is not intended to replace advice given to you by your health care provider. Make sure you discuss any questions you have with your health care provider. Document Released: 07/14/2002 Document Revised: 09/29/2015 Document Reviewed: 04/14/2013 Elsevier Interactive Patient Education  [employer].

## 2017-02-06 NOTE — Progress Notes (Signed)
   Subjective: ZC:HYIFOYD rash PCP: Claretta Fraise, MD Marilyn Ford is a 56 y.o. female presenting to clinic today for:  1. Painful rash Patient reports that she developed a painful rash on her mid back and under her left arm somewhere around Thursday or Friday. She describes the rash as burning. She reports that it started out vesicular but that the lesions underneath her left arm have now scabbed over. She has been using topical Benadryl and attempts to relieve the pain with no improvement. She denies any change in oral medications, recent travel, new pets, new foods, new lotions or soaps. She reports she did get a flu vaccine last Wednesday and is worried that her rash may be a result of a drug reaction. No fevers, no chills.   Allergies  Allergen Reactions  . Penicillins Shortness Of Breath   Past Medical History:  Diagnosis Date  . Diabetes mellitus without complication ([place])   . Hypertension   . Stroke [facility])    Family History  Problem Relation Age of Onset  . Diabetes Brother   . Diabetes Mother    Social Hx: former smoker.Current medications reviewed.   ROS: Per HPI  Objective: Office vital signs reviewed. BP 105/70   Pulse 81   Temp 98.2 F (36.8 C) (Oral)   Ht 5\' 4"  (1.626 m)   Wt 152 lb (68.9 kg)   BMI 26.09 kg/m   Physical Examination:  General: Awake, alert, well nourished, nontoxic appearing, No acute distress Skin: dry; Patient has a small cluster of healing/crusted over lesions under the left axilla inferiorly. These are tender to palpation. No surrounding induration or significant erythema. Nonexudative, nonbleeding. She also has a small cluster of vesicles but are not yet crusted over at the T3 dermatomal level on the left side near her spine.  Assessment/ Plan: 56 y.o. female   1. Herpes zoster without complication Clinically consistent with shingles outbreak at the T3 dermatome. She still has active infection with a small cluster of vesicles on  the left side of her back. We discussed that she is considered infectious until these are crusted over. She is greater than 72 hours out from initial presentation and not developing new lesions, therefore she is outside of the window of treatment with antivirals. She is experiencing burning pain with the rash. I discussed that she can use topical capsaicin and oral Tylenol for pain. Would not recommend the use of NSAIDs given her impaired renal function. While neuropathic medications like gabapentin are not well described in the acute phase of shingles outbreak, I have prescribed this renally dosed up to 2 times daily for postherpetic neuralgia. We reviewed instructions for use and I cautioned sedation. Patient will start with conservative therapy with topical and Tylenol and if this is ineffective we'll proceed to use gabapentin. I did recommend that she have the shingles vaccination once this infection has resolved. Return precautions were reviewed with the patient and she voiced good understanding. She will follow up with her primary care doctor as needed.  No orders of the defined types were placed in this encounter.  Meds ordered this encounter  Medications  . gabapentin (NEURONTIN) 300 MG capsule    Sig: Take 1 capsule (300 mg total) by mouth 2 (two) times daily.    Dispense:  60 capsule    Refill:  [place], [place] [phone]

## 2017-04-03 ENCOUNTER — Other Ambulatory Visit (INDEPENDENT_AMBULATORY_CARE_PROVIDER_SITE_OTHER): Payer: Self-pay | Admitting: *Deleted

## 2017-04-03 DIAGNOSIS — Z1211 Encounter for screening for malignant neoplasm of colon: Secondary | ICD-10-CM | POA: Insufficient documentation

## 2017-04-05 ENCOUNTER — Telehealth: Payer: Self-pay | Admitting: Family Medicine

## 2017-04-05 NOTE — Telephone Encounter (Signed)
Please review medications and advise. Thanks 

## 2017-04-05 NOTE — Telephone Encounter (Signed)
Pt dentist  Wants her to take 800mg  IBP for inflammation in her mouth and wants to make sure it will not interfere with the meds she is on.

## 2017-04-05 NOTE — Telephone Encounter (Signed)
Patient aware and verbalizes understanding. 

## 2017-04-05 NOTE — Telephone Encounter (Signed)
Ibuprofen is not compatible with plavix. She could take celebrex instead.

## 2017-04-06 ENCOUNTER — Telehealth: Payer: Self-pay | Admitting: Family Medicine

## 2017-04-08 ENCOUNTER — Other Ambulatory Visit: Payer: Self-pay | Admitting: Family Medicine

## 2017-04-08 MED ORDER — CELECOXIB 200 MG PO CAPS: 200.0000 mg | ORAL_CAPSULE | Freq: Every day | ORAL | 5 refills | Status: DC

## 2017-04-08 NOTE — Telephone Encounter (Signed)
I sent in the requested prescription 

## 2017-04-08 NOTE — Telephone Encounter (Signed)
Patient has never had celebrex before. Will you please dose and send to [facility] in [place]

## 2017-04-15 ENCOUNTER — Ambulatory Visit: Payer: 59 | Admitting: Family Medicine

## 2017-04-17 ENCOUNTER — Ambulatory Visit: Payer: 59 | Admitting: Family Medicine

## 2017-04-26 ENCOUNTER — Ambulatory Visit: Payer: 59 | Admitting: Family Medicine

## 2017-05-02 ENCOUNTER — Ambulatory Visit: Payer: Self-pay | Admitting: Family Medicine

## 2017-05-17 ENCOUNTER — Encounter: Payer: Self-pay | Admitting: Family Medicine

## 2017-05-17 ENCOUNTER — Ambulatory Visit (INDEPENDENT_AMBULATORY_CARE_PROVIDER_SITE_OTHER): Payer: 59 | Admitting: Family Medicine

## 2017-05-17 VITALS — BP 129/81 | HR 83 | Temp 96.9°F | Ht 64.0 in | Wt 156.0 lb

## 2017-05-17 DIAGNOSIS — E782 Mixed hyperlipidemia: Secondary | ICD-10-CM | POA: Diagnosis not present

## 2017-05-17 DIAGNOSIS — E1159 Type 2 diabetes mellitus with other circulatory complications: Secondary | ICD-10-CM | POA: Diagnosis not present

## 2017-05-17 DIAGNOSIS — Z87898 Personal history of other specified conditions: Secondary | ICD-10-CM | POA: Diagnosis not present

## 2017-05-17 DIAGNOSIS — I10 Essential (primary) hypertension: Secondary | ICD-10-CM | POA: Diagnosis not present

## 2017-05-17 DIAGNOSIS — Z8742 Personal history of other diseases of the female genital tract: Secondary | ICD-10-CM

## 2017-05-17 LAB — BAYER DCA HB A1C WAIVED: HB A1C (BAYER DCA - WAIVED): 6.5 % (ref <7.0)

## 2017-05-17 MED ORDER — TRAZODONE HCL 150 MG PO TABS: ORAL_TABLET | ORAL | 1 refills | Status: DC

## 2017-05-17 NOTE — Patient Instructions (Signed)
Hold Plavix and Aspirin for 5 days prior to dental procedures. DASH Eating Plan DASH stands for "Dietary Approaches to Stop Hypertension." The DASH eating plan is a healthy eating plan that has been shown to reduce high blood pressure (hypertension). It may also reduce your risk for type 2 diabetes, heart disease, and stroke. The DASH eating plan may also help with weight loss. What are tips for following this plan? General guidelines  Avoid eating more than 2,300 mg (milligrams) of salt (sodium) a day. If you have hypertension, you may need to reduce your sodium intake to 1,500 mg a day.  Limit alcohol intake to no more than 1 drink a day for nonpregnant women and 2 drinks a day for men. One drink equals 12 oz of beer, 5 oz of wine, or 1 oz of hard liquor.  Work with your health care provider to maintain a healthy body weight or to lose weight. Ask what an ideal weight is for you.  Get at least 30 minutes of exercise that causes your heart to beat faster (aerobic exercise) most days of the week. Activities may include walking, swimming, or biking.  Work with your health care provider or diet and nutrition specialist (dietitian) to adjust your eating plan to your individual calorie needs. Reading food labels  Check food labels for the amount of sodium per serving. Choose foods with less than 5 percent of the Daily Value of sodium. Generally, foods with less than 300 mg of sodium per serving fit into this eating plan.  To find whole grains, look for the word "whole" as the first word in the ingredient list. Shopping  Buy products labeled as "low-sodium" or "no salt added."  Buy fresh foods. Avoid canned foods and premade or frozen meals. Cooking  Avoid adding salt when cooking. Use salt-free seasonings or herbs instead of table salt or sea salt. Check with your health care provider or pharmacist before using salt substitutes.  Do not fry foods. Cook foods using healthy methods such as  baking, boiling, grilling, and broiling instead.  Cook with heart-healthy oils, such as olive, canola, soybean, or sunflower oil. Meal planning   Eat a balanced diet that includes: ? 5 or more servings of fruits and vegetables each day. At each meal, try to fill half of your plate with fruits and vegetables. ? Up to 6-8 servings of whole grains each day. ? Less than 6 oz of lean meat, poultry, or fish each day. A 3-oz serving of meat is about the same size as a deck of cards. One egg equals 1 oz. ? 2 servings of low-fat dairy each day. ? A serving of nuts, seeds, or beans 5 times each week. ? Heart-healthy fats. Healthy fats called Omega-3 fatty acids are found in foods such as flaxseeds and coldwater fish, like sardines, salmon, and mackerel.  Limit how much you eat of the following: ? Canned or prepackaged foods. ? Food that is high in trans fat, such as fried foods. ? Food that is high in saturated fat, such as fatty meat. ? Sweets, desserts, sugary drinks, and other foods with added sugar. ? Full-fat dairy products.  Do not salt foods before eating.  Try to eat at least 2 vegetarian meals each week.  Eat more home-cooked food and less restaurant, buffet, and fast food.  When eating at a restaurant, ask that your food be prepared with less salt or no salt, if possible. What foods are recommended? The items listed may  not be a complete list. Talk with your dietitian about what dietary choices are best for you. Grains Whole-grain or whole-wheat bread. Whole-grain or whole-wheat pasta. Brown rice. [place]. Bulgur. Whole-grain and low-sodium cereals. Pita bread. Low-fat, low-sodium crackers. Whole-wheat flour tortillas. Vegetables Fresh or frozen vegetables (raw, steamed, roasted, or grilled). Low-sodium or reduced-sodium tomato and vegetable juice. Low-sodium or reduced-sodium tomato sauce and tomato paste. Low-sodium or reduced-sodium canned vegetables. Fruits All fresh,  dried, or frozen fruit. Canned fruit in natural juice (without added sugar). Meat and other protein foods Skinless chicken or [place]. Ground chicken or [place]. Pork with fat trimmed off. Fish and seafood. Egg whites. Dried beans, peas, or lentils. Unsalted nuts, nut butters, and seeds. Unsalted canned beans. Lean cuts of beef with fat trimmed off. Low-sodium, lean deli meat. Dairy Low-fat (1%) or fat-free (skim) milk. Fat-free, low-fat, or reduced-fat cheeses. Nonfat, low-sodium ricotta or cottage cheese. Low-fat or nonfat yogurt. Low-fat, low-sodium cheese. Fats and oils Soft margarine without trans fats. Vegetable oil. Low-fat, reduced-fat, or light mayonnaise and salad dressings (reduced-sodium). Canola, safflower, olive, soybean, and sunflower oils. Avocado. Seasoning and other foods Herbs. Spices. Seasoning mixes without salt. Unsalted popcorn and pretzels. Fat-free sweets. What foods are not recommended? The items listed may not be a complete list. Talk with your dietitian about what dietary choices are best for you. Grains Baked goods made with fat, such as croissants, muffins, or some breads. Dry pasta or rice meal packs. Vegetables Creamed or fried vegetables. Vegetables in a cheese sauce. Regular canned vegetables (not low-sodium or reduced-sodium). Regular canned tomato sauce and paste (not low-sodium or reduced-sodium). Regular tomato and vegetable juice (not low-sodium or reduced-sodium). Angie Fava. Olives. Fruits Canned fruit in a light or heavy syrup. Fried fruit. Fruit in cream or butter sauce. Meat and other protein foods Fatty cuts of meat. Ribs. Fried meat. Berniece Salines. Sausage. Bologna and other processed lunch meats. Salami. Fatback. Hotdogs. Bratwurst. Salted nuts and seeds. Canned beans with added salt. Canned or smoked fish. Whole eggs or egg yolks. Chicken or [place] with skin. Dairy Whole or 2% milk, cream, and half-and-half. Whole or full-fat cream cheese. Whole-fat or sweetened  yogurt. Full-fat cheese. Nondairy creamers. Whipped toppings. Processed cheese and cheese spreads. Fats and oils Butter. Stick margarine. Lard. Shortening. Ghee. Bacon fat. Tropical oils, such as coconut, palm kernel, or palm oil. Seasoning and other foods Salted popcorn and pretzels. Onion salt, garlic salt, seasoned salt, table salt, and sea salt. Worcestershire sauce. Tartar sauce. Barbecue sauce. Teriyaki sauce. Soy sauce, including reduced-sodium. Steak sauce. Canned and packaged gravies. Fish sauce. Oyster sauce. Cocktail sauce. Horseradish that you find on the shelf. Ketchup. Mustard. Meat flavorings and tenderizers. Bouillon cubes. Hot sauce and Tabasco sauce. Premade or packaged marinades. Premade or packaged taco seasonings. Relishes. Regular salad dressings. Where to find more information:  National Heart, Lung, and [place]: https://wilson-eaton.com/  American Heart Association: www.heart.org Summary  The DASH eating plan is a healthy eating plan that has been shown to reduce high blood pressure (hypertension). It may also reduce your risk for type 2 diabetes, heart disease, and stroke.  With the DASH eating plan, you should limit salt (sodium) intake to 2,300 mg a day. If you have hypertension, you may need to reduce your sodium intake to 1,500 mg a day.  When on the DASH eating plan, aim to eat more fresh fruits and vegetables, whole grains, lean proteins, low-fat dairy, and heart-healthy fats.  Work with your health care provider or diet and  nutrition specialist (dietitian) to adjust your eating plan to your individual calorie needs. This information is not intended to replace advice given to you by your health care provider. Make sure you discuss any questions you have with your health care provider. Document Released: 04/12/2011 Document Revised: 04/16/2016 Document Reviewed: 04/16/2016 Elsevier Interactive Patient Education  [employer].

## 2017-05-17 NOTE — Progress Notes (Signed)
Subjective:  Patient ID: Marilyn Ford,  female    DOB: [birth year]  Age: 57 y.o.    CC: Diabetes (pt here today for routine follow up of her chronic medical conditions, no other concerns voiced.)   HPI Marilyn Ford presents for  follow-up of hypertension. Patient has no history of headache chest pain or shortness of breath or recent cough. Patient also denies symptoms of TIA such as numbness weakness lateralizing. Patient checks  blood pressure at home. Recent readings have been good Patient denies side effects from medication. States taking it regularly.  Patient also  in for follow-up of elevated cholesterol. Doing well without complaints on current medication. Denies side effects of statin including myalgia and arthralgia and nausea. Also in today for liver function testing. Currently no chest pain, shortness of breath or other cardiovascular related symptoms noted.  Follow-up of diabetes. Patient does check blood sugar at home. Readings run between 100 and 150 Patient denies symptoms such as polyuria, polydipsia, excessive hunger, nausea No significant hypoglycemic spells noted. Medications reviewed. Pt reports taking them regularly. Pt. denies complication/adverse reaction today.    History Marilyn Ford has a past medical history of Diabetes mellitus without complication ([place]), Hypertension, and Stroke ([place]).   She has a past surgical history that includes Cholecystectomy and Cesarean section.   Her family history includes Diabetes in her brother and mother.She reports that she quit smoking about 2 years ago. Her smoking use included cigarettes. She started smoking about 17 years ago. She smoked 0.50 packs per day. she has never used smokeless tobacco. She reports that she does not drink alcohol or use drugs.  Current Outpatient Medications on File Prior to Visit  Medication Sig Dispense Refill  . amLODipine (NORVASC) 10 MG tablet TAKE ONE TABLET BY MOUTH ONCE DAILY FOR BLOOD  PRESSURE 90 tablet 3  . aspirin EC 81 MG EC tablet Take 1 tablet (81 mg total) by mouth daily. 30 tablet 0  . atorvastatin (LIPITOR) 80 MG tablet TAKE 1 TABLET DAILY AT 6 P.M. 90 tablet 1  . celecoxib (CELEBREX) 200 MG capsule Take 1 capsule (200 mg total) by mouth daily. With food 30 capsule 5  . clopidogrel (PLAVIX) 75 MG tablet Take 1 tablet (75 mg total) by mouth daily. 90 tablet 1  . gabapentin (NEURONTIN) 300 MG capsule Take 1 capsule (300 mg total) by mouth 2 (two) times daily. 60 capsule 1  . metformin (FORTAMET) 1000 MG (OSM) 24 hr tablet Take 1 tablet (1,000 mg total) by mouth daily with breakfast. 90 tablet 3  . metoprolol succinate (TOPROL-XL) 50 MG 24 hr tablet TAKE ONE TABLET BY MOUTH ONCE DAILY TAKE WITH OR IMMEDIATELY FOLLOWING A MEAL 90 tablet 3  . Multiple Vitamin (MULTIVITAMIN) capsule Take 1 capsule by mouth daily.    . valsartan-hydrochlorothiazide (DIOVAN-HCT) 320-25 MG tablet Take 1 tablet by mouth daily. 90 tablet 3   No current facility-administered medications on file prior to visit.     ROS Review of Systems  Constitutional: Negative for activity change, appetite change and fever.  HENT: Negative for congestion, rhinorrhea and sore throat.   Eyes: Negative for visual disturbance.  Respiratory: Negative for cough and shortness of breath.   Cardiovascular: Negative for chest pain and palpitations.  Gastrointestinal: Negative for abdominal pain, diarrhea and nausea.  Genitourinary: Negative for dysuria.  Musculoskeletal: Negative for arthralgias and myalgias.    Objective:  BP 129/81   Pulse 83   Temp (!) 96.9 F (36.1 C) (  Oral)   Ht '5\' 4"'  (1.626 m)   Wt 156 lb (70.8 kg)   BMI 26.78 kg/m   BP Readings from Last 3 Encounters:  05/17/17 129/81  02/06/17 105/70  01/28/17 111/69    Wt Readings from Last 3 Encounters:  05/17/17 156 lb (70.8 kg)  02/06/17 152 lb (68.9 kg)  01/28/17 155 lb (70.3 kg)     Physical Exam  Constitutional: She is oriented  to person, place, and time. She appears well-developed and well-nourished. No distress.  HENT:  Head: Normocephalic and atraumatic.  Right Ear: External ear normal.  Left Ear: External ear normal.  Nose: Nose normal.  Mouth/Throat: Oropharynx is clear and moist.  Eyes: Conjunctivae and EOM are normal. Pupils are equal, round, and reactive to light.  Neck: Normal range of motion. Neck supple. No thyromegaly present.  Cardiovascular: Normal rate, regular rhythm and normal heart sounds.  No murmur heard. Pulmonary/Chest: Effort normal and breath sounds normal. No respiratory distress. She has no wheezes. She has no rales.  Abdominal: Soft. Bowel sounds are normal. She exhibits no distension. There is no tenderness.  Lymphadenopathy:    She has no cervical adenopathy.  Neurological: She is alert and oriented to person, place, and time. She has normal reflexes.  Skin: Skin is warm and dry.  Psychiatric: She has a normal mood and affect. Her behavior is normal. Judgment and thought content normal.      Assessment & Plan:   Marilyn Ford was seen today for diabetes.  Diagnoses and all orders for this visit:  Essential hypertension -     CBC with Differential/Platelet -     CMP14+EGFR  Controlled type 2 diabetes mellitus with other circulatory complication, without long-term current use of insulin (HCC) -     Bayer DCA Hb A1c Waived  Mixed hyperlipidemia -     Lipid panel  History of abnormal cervical Pap smear -     Ambulatory referral to Gynecology  Other orders -     traZODone (DESYREL) 150 MG tablet; 1 or 2 at bedtime for sleep   I am having Marilyn Ford maintain her multivitamin, aspirin, amLODipine, atorvastatin, clopidogrel, metformin, metoprolol succinate, valsartan-hydrochlorothiazide, gabapentin, celecoxib, and traZODone.  Meds ordered this encounter  Medications  . traZODone (DESYREL) 150 MG tablet    Sig: 1 or 2 at bedtime for sleep    Dispense:  180 tablet     Refill:  1     Follow-up: Return in about 3 months (around 08/15/2017).  Claretta Fraise, M.D.

## 2017-05-18 LAB — CBC WITH DIFFERENTIAL/PLATELET
Basophils Absolute: 0.1 10*3/uL (ref 0.0–0.2)
Basos: 1 %
EOS (ABSOLUTE): 0.2 10*3/uL (ref 0.0–0.4)
EOS: 2 %
HEMATOCRIT: 41.2 % (ref 34.0–46.6)
HEMOGLOBIN: 13.3 g/dL (ref 11.1–15.9)
IMMATURE GRANS (ABS): 0 10*3/uL (ref 0.0–0.1)
Immature Granulocytes: 0 %
LYMPHS: 27 %
Lymphocytes Absolute: 2.1 10*3/uL (ref 0.7–3.1)
MCH: 29.2 pg (ref 26.6–33.0)
MCHC: 32.3 g/dL (ref 31.5–35.7)
MCV: 90 fL (ref 79–97)
MONOCYTES: 3 %
Monocytes Absolute: 0.3 10*3/uL (ref 0.1–0.9)
NEUTROS ABS: 5.2 10*3/uL (ref 1.4–7.0)
Neutrophils: 67 %
Platelets: 314 10*3/uL (ref 150–379)
RBC: 4.56 x10E6/uL (ref 3.77–5.28)
RDW: 14.2 % (ref 12.3–15.4)
WBC: 7.8 10*3/uL (ref 3.4–10.8)

## 2017-05-18 LAB — LIPID PANEL
CHOLESTEROL TOTAL: 157 mg/dL (ref 100–199)
Chol/HDL Ratio: 3.3 ratio (ref 0.0–4.4)
HDL: 47 mg/dL (ref >39)
LDL CALC: 69 mg/dL (ref 0–99)
Triglycerides: 204 mg/dL — ABNORMAL HIGH (ref 0–149)
VLDL CHOLESTEROL CAL: 41 mg/dL — AB (ref 5–40)

## 2017-05-18 LAB — CMP14+EGFR
ALBUMIN: 4.8 g/dL (ref 3.5–5.5)
ALK PHOS: 140 IU/L — AB (ref 39–117)
ALT: 24 IU/L (ref 0–32)
AST: 20 IU/L (ref 0–40)
Albumin/Globulin Ratio: 1.5 (ref 1.2–2.2)
BUN / CREAT RATIO: 11 (ref 9–23)
BUN: 11 mg/dL (ref 6–24)
Bilirubin Total: 0.3 mg/dL (ref 0.0–1.2)
CO2: 27 mmol/L (ref 20–29)
CREATININE: 0.97 mg/dL (ref 0.57–1.00)
Calcium: 10.1 mg/dL (ref 8.7–10.2)
Chloride: 99 mmol/L (ref 96–106)
GFR calc non Af Amer: 65 mL/min/{1.73_m2} (ref >59)
GFR, EST AFRICAN AMERICAN: 76 mL/min/{1.73_m2} (ref >59)
GLOBULIN, TOTAL: 3.3 g/dL (ref 1.5–4.5)
Glucose: 150 mg/dL — ABNORMAL HIGH (ref 65–99)
Potassium: 4.3 mmol/L (ref 3.5–5.2)
SODIUM: 143 mmol/L (ref 134–144)
TOTAL PROTEIN: 8.1 g/dL (ref 6.0–8.5)

## 2017-05-21 ENCOUNTER — Encounter: Payer: Self-pay | Admitting: Family Medicine

## 2017-06-28 ENCOUNTER — Telehealth: Payer: Self-pay | Admitting: Family Medicine

## 2017-06-28 ENCOUNTER — Telehealth (INDEPENDENT_AMBULATORY_CARE_PROVIDER_SITE_OTHER): Payer: Self-pay | Admitting: *Deleted

## 2017-06-28 ENCOUNTER — Encounter (INDEPENDENT_AMBULATORY_CARE_PROVIDER_SITE_OTHER): Payer: Self-pay | Admitting: *Deleted

## 2017-06-28 NOTE — Telephone Encounter (Signed)
Referring MD/PCP: stacks -- [facility]   Procedure: tcs  Reason/Indication:  screening  Has patient had this procedure before?  no  If so, when, by whom and where?    Is there a family history of colon cancer?  no  Who?  What age when diagnosed?    Is patient diabetic?   yes      Does patient have prosthetic heart valve or mechanical valve?  no  Do you have a pacemaker?  no  Has patient ever had endocarditis? no  Has patient had joint replacement within last 12 months?  no  Is patient constipated or do they take laxatives? no  Does patient have a history of alcohol/drug use?  no  Is patient on blood thinner such as Coumadin, Plavix and/or Aspirin? yes  Medications: see epic  Allergies: pcn  Medication Adjustment per Dr Lindi Adie, NP: asa 2 days, plavix 5 days, hold metformin morning of  Procedure date & time: 07/25/17 at 730

## 2017-06-28 NOTE — Telephone Encounter (Signed)
Patient needs trilyte 

## 2017-06-30 NOTE — Telephone Encounter (Signed)
She should be fine doing this. Please let her know. WS

## 2017-07-01 ENCOUNTER — Telehealth (INDEPENDENT_AMBULATORY_CARE_PROVIDER_SITE_OTHER): Payer: Self-pay | Admitting: *Deleted

## 2017-07-01 MED ORDER — PEG 3350-KCL-NA BICARB-NACL 420 G PO SOLR: 4000.0000 mL | Freq: Once | ORAL | 0 refills | Status: AC

## 2017-07-01 NOTE — Telephone Encounter (Signed)
agree

## 2017-07-01 NOTE — Telephone Encounter (Signed)
Detailed message left for Ann at Dr. Olevia Perches that Sr. Quinn Axe said that it was ok for her to stop plavix and asa before colonoscopy.

## 2017-07-01 NOTE — Telephone Encounter (Signed)
Per Dr. Quinn Axe it is ok for patient to stop plavix and asa before colonoscopy -- patient aware

## 2017-07-04 ENCOUNTER — Other Ambulatory Visit (HOSPITAL_COMMUNITY)
Admission: RE | Admit: 2017-07-04 | Discharge: 2017-07-04 | Disposition: A | Discharge status: Home / Self Care | Payer: 59 | Source: Ambulatory Visit | Attending: Adult Health | Admitting: Adult Health

## 2017-07-04 ENCOUNTER — Ambulatory Visit (INDEPENDENT_AMBULATORY_CARE_PROVIDER_SITE_OTHER): Payer: 59 | Admitting: Adult Health

## 2017-07-04 ENCOUNTER — Encounter: Payer: Self-pay | Admitting: Adult Health

## 2017-07-04 VITALS — BP 120/70 | HR 98 | Ht 62.0 in | Wt 161.0 lb

## 2017-07-04 DIAGNOSIS — Z01411 Encounter for gynecological examination (general) (routine) with abnormal findings: Secondary | ICD-10-CM | POA: Diagnosis not present

## 2017-07-04 DIAGNOSIS — Z1212 Encounter for screening for malignant neoplasm of rectum: Secondary | ICD-10-CM | POA: Diagnosis not present

## 2017-07-04 DIAGNOSIS — Z01419 Encounter for gynecological examination (general) (routine) without abnormal findings: Secondary | ICD-10-CM | POA: Insufficient documentation

## 2017-07-04 DIAGNOSIS — Z8673 Personal history of transient ischemic attack (TIA), and cerebral infarction without residual deficits: Secondary | ICD-10-CM

## 2017-07-04 DIAGNOSIS — Z1211 Encounter for screening for malignant neoplasm of colon: Secondary | ICD-10-CM

## 2017-07-04 LAB — HEMOCCULT GUIAC POC 1CARD (OFFICE): FECAL OCCULT BLD: NEGATIVE

## 2017-07-04 NOTE — Addendum Note (Signed)
Addended by: Diona Fanti A on: 07/04/2017 03:45 PM   Modules accepted: Orders

## 2017-07-04 NOTE — Progress Notes (Signed)
Patient ID: Marilyn Ford, female   DOB: 05/13/1960, 57 y.o.   MRN: [id number] History of Present Illness: Marilyn Ford is a 57 year old white female, married, PM in for well woman gyn exam and pap. She used to work as Quarry manager at [employer]. She is a new patient.  PCP is Dr Livia Snellen.    Current Medications, Allergies, Past Medical History, Past Surgical History, Family History and Social History were reviewed in [employer] record.     Review of Systems: Patient denies any headaches, hearing loss, fatigue, blurred vision, shortness of breath, chest pain, abdominal pain, problems with bowel movements, urination, or intercourse(not having sex). No joint pain or mood swings.Has not had any bleeding since menopause. She had abnormal pap after first child and last pap was about 7 years ago.  +short term memory loss since stroke 2.5 years ago and some weakness on right.    Physical Exam:BP 120/70 (BP Location: Left Arm, Patient Position: Sitting, Cuff Size: Small)   Pulse 98   Ht 5\' 2"  (1.575 m)   Wt 161 lb (73 kg)   BMI 29.45 kg/m  General:  Well developed, well nourished, no acute distress Skin:  Warm and dry Neck:  Midline trachea, normal thyroid, good ROM, no lymphadenopathy Lungs; Clear to auscultation bilaterally Breast:  No dominant palpable mass, retraction, or nipple discharge Cardiovascular: Regular rate and rhythm Abdomen:  Soft, non tender, no hepatosplenomegaly Pelvic:  External genitalia is normal in appearance, no lesions,has small sebaceous cyst left labia.  The vagina is normal in appearance. Urethra has no lesions or masses. The cervix is bulbous and smooth, pap with HPV performed.  Uterus is felt to be normal size, shape, and contour.  No adnexal masses or tenderness noted.Bladder is non tender, no masses felt. Rectal: Good sphincter tone, no polyps, or hemorrhoids felt.  Hemoccult negative. Extremities/musculoskeletal:  No swelling or varicosities noted, no  clubbing or cyanosis,has some weakness of right hand and foot.  Psych:  No mood changes, alert and cooperative,seems happy PHQ 9 score 1.  Impression: 1. Encounter for gynecological examination with Papanicolaou smear of cervix   2. Screening for colorectal cancer   3. History of stroke       Plan: Physical in 1 year Pap in 3 if normal Mammogram in April Colonoscopy in April Labs with PCP

## 2017-07-08 ENCOUNTER — Telehealth: Payer: Self-pay | Admitting: *Deleted

## 2017-07-08 NOTE — Telephone Encounter (Signed)
Yes she can. Please notify Chong Sicilian.

## 2017-07-08 NOTE — Telephone Encounter (Signed)
Pt is reluctant to have colonoscopy  Pt is uncomfortable having to d/c meds for procedure Pt wants to know if she can do cologard instead Please advise

## 2017-07-09 LAB — CYTOLOGY - PAP
DIAGNOSIS: NEGATIVE
HPV (WINDOPATH): NOT DETECTED

## 2017-07-09 NOTE — Telephone Encounter (Signed)
Notified patient that cologard will be ordered by this office and that she should receive packet in about 2 weeks.  Patient verbalized understanding.

## 2017-07-16 ENCOUNTER — Telehealth: Payer: Self-pay | Admitting: *Deleted

## 2017-07-17 NOTE — Telephone Encounter (Signed)
Pt called requesting results from PAP. DOB verified. Informed pt that results were normal.

## 2017-07-25 ENCOUNTER — Encounter (HOSPITAL_COMMUNITY): Admission: RE | Payer: Self-pay | Source: Ambulatory Visit

## 2017-07-25 ENCOUNTER — Ambulatory Visit (HOSPITAL_COMMUNITY): Admission: RE | Admit: 2017-07-25 | Payer: 59 | Source: Ambulatory Visit | Admitting: Internal Medicine

## 2017-07-25 SURGERY — COLONOSCOPY
Anesthesia: Moderate Sedation

## 2017-07-29 NOTE — Telephone Encounter (Signed)
cologuard ordered.

## 2017-08-16 ENCOUNTER — Encounter: Payer: Self-pay | Admitting: Family Medicine

## 2017-08-16 ENCOUNTER — Ambulatory Visit (INDEPENDENT_AMBULATORY_CARE_PROVIDER_SITE_OTHER): Payer: 59 | Admitting: Family Medicine

## 2017-08-16 VITALS — BP 113/66 | HR 64 | Ht 62.0 in | Wt 163.0 lb

## 2017-08-16 DIAGNOSIS — E1159 Type 2 diabetes mellitus with other circulatory complications: Secondary | ICD-10-CM | POA: Diagnosis not present

## 2017-08-16 DIAGNOSIS — I10 Essential (primary) hypertension: Secondary | ICD-10-CM | POA: Diagnosis not present

## 2017-08-16 DIAGNOSIS — E782 Mixed hyperlipidemia: Secondary | ICD-10-CM | POA: Diagnosis not present

## 2017-08-16 LAB — URINALYSIS
Bilirubin, UA: NEGATIVE
GLUCOSE, UA: NEGATIVE
Ketones, UA: NEGATIVE
Leukocytes, UA: NEGATIVE
Nitrite, UA: NEGATIVE
PH UA: 7 (ref 5.0–7.5)
PROTEIN UA: NEGATIVE
RBC, UA: NEGATIVE
Specific Gravity, UA: 1.015 (ref 1.005–1.030)
Urobilinogen, Ur: 0.2 mg/dL (ref 0.2–1.0)

## 2017-08-16 LAB — BAYER DCA HB A1C WAIVED: HB A1C (BAYER DCA - WAIVED): 6.6 % (ref <7.0)

## 2017-08-16 MED ORDER — ATORVASTATIN CALCIUM 80 MG PO TABS: ORAL_TABLET | ORAL | 1 refills | Status: DC

## 2017-08-16 MED ORDER — CLOPIDOGREL BISULFATE 75 MG PO TABS: 75.0000 mg | ORAL_TABLET | Freq: Every day | ORAL | 1 refills | Status: DC

## 2017-08-17 LAB — CMP14+EGFR
A/G RATIO: 1.6 (ref 1.2–2.2)
ALK PHOS: 131 IU/L — AB (ref 39–117)
ALT: 44 IU/L — ABNORMAL HIGH (ref 0–32)
AST: 28 IU/L (ref 0–40)
Albumin: 4.7 g/dL (ref 3.5–5.5)
BILIRUBIN TOTAL: 0.4 mg/dL (ref 0.0–1.2)
BUN / CREAT RATIO: 18 (ref 9–23)
BUN: 16 mg/dL (ref 6–24)
CHLORIDE: 96 mmol/L (ref 96–106)
CO2: 26 mmol/L (ref 20–29)
Calcium: 10 mg/dL (ref 8.7–10.2)
Creatinine, Ser: 0.9 mg/dL (ref 0.57–1.00)
GFR calc Af Amer: 83 mL/min/{1.73_m2} (ref >59)
GFR calc non Af Amer: 72 mL/min/{1.73_m2} (ref >59)
Globulin, Total: 3 g/dL (ref 1.5–4.5)
Glucose: 159 mg/dL — ABNORMAL HIGH (ref 65–99)
POTASSIUM: 4 mmol/L (ref 3.5–5.2)
SODIUM: 139 mmol/L (ref 134–144)
Total Protein: 7.7 g/dL (ref 6.0–8.5)

## 2017-08-17 LAB — MICROALBUMIN / CREATININE URINE RATIO
CREATININE, UR: 75.1 mg/dL
Microalb/Creat Ratio: <4 mg/g creat (ref 0.0–30.0)

## 2017-08-17 LAB — CBC WITH DIFFERENTIAL/PLATELET
Basophils Absolute: 0.1 10*3/uL (ref 0.0–0.2)
Basos: 1 %
EOS (ABSOLUTE): 0.2 10*3/uL (ref 0.0–0.4)
EOS: 2 %
HEMATOCRIT: 37.7 % (ref 34.0–46.6)
Hemoglobin: 12.9 g/dL (ref 11.1–15.9)
Immature Grans (Abs): 0 10*3/uL (ref 0.0–0.1)
Immature Granulocytes: 0 %
LYMPHS ABS: 2.2 10*3/uL (ref 0.7–3.1)
Lymphs: 27 %
MCH: 30.2 pg (ref 26.6–33.0)
MCHC: 34.2 g/dL (ref 31.5–35.7)
MCV: 88 fL (ref 79–97)
MONOS ABS: 0.4 10*3/uL (ref 0.1–0.9)
Monocytes: 5 %
NEUTROS ABS: 5.3 10*3/uL (ref 1.4–7.0)
Neutrophils: 65 %
Platelets: 296 10*3/uL (ref 150–379)
RBC: 4.27 x10E6/uL (ref 3.77–5.28)
RDW: 14.1 % (ref 12.3–15.4)
WBC: 8.2 10*3/uL (ref 3.4–10.8)

## 2017-08-18 ENCOUNTER — Encounter: Payer: Self-pay | Admitting: Family Medicine

## 2017-08-18 NOTE — Progress Notes (Signed)
Subjective:  Patient ID: Marilyn Ford,  female    DOB: 1960/07/08  Age: 57 y.o.    CC: Diabetes (pt here today for routine follow up of her chronic medical conditions)   HPI Marilyn Ford presents for  follow-up of hypertension. Patient has no history of headache chest pain or shortness of breath or recent cough. Patient also denies symptoms of TIA such as numbness weakness lateralizing. Patient denies side effects from medication. States taking it regularly.  Patient also  in for follow-up of elevated cholesterol. Doing well without complaints on current medication. Denies side effects  including myalgia and arthralgia and nausea. Also in today for liver function testing. Currently no chest pain, shortness of breath or other cardiovascular related symptoms noted.  Follow-up of diabetes. Patient does check blood sugar at home. Readings run between 100 and 150 Patient denies symptoms such as excessive hunger or urinary frequency, excessive hunger, nausea.  She admits to eating sweets more than she should.  She asked me about fruit and says that she has started trying to substitute fruit for sweets and I confirmed with her that fruit is approved for 2 servings a day.  He is to limit portion size and number of servings. No significant hypoglycemic spells noted. Medications reviewed. Pt reports taking them regularly. Pt. denies complication/adverse reaction today.    History Marilyn Ford has a past medical history of Diabetes mellitus without complication ([place]), Hypertension, Stroke ([place]), and Vaginal Pap smear, abnormal.   She has a past surgical history that includes Cholecystectomy and Cesarean section.   Her family history includes Diabetes in her brother and mother.She reports that she quit smoking about 2 years ago. Her smoking use included cigarettes. She started smoking about 17 years ago. She smoked 0.50 packs per day. She has never used smokeless tobacco. She reports that she does  not drink alcohol or use drugs.  Current Outpatient Medications on File Prior to Visit  Medication Sig Dispense Refill  . amLODipine (NORVASC) 10 MG tablet TAKE ONE TABLET BY MOUTH ONCE DAILY FOR BLOOD PRESSURE 90 tablet 3  . aspirin EC 81 MG EC tablet Take 1 tablet (81 mg total) by mouth daily. 30 tablet 0  . metformin (FORTAMET) 1000 MG (OSM) 24 hr tablet Take 1 tablet (1,000 mg total) by mouth daily with breakfast. 90 tablet 3  . metoprolol succinate (TOPROL-XL) 50 MG 24 hr tablet TAKE ONE TABLET BY MOUTH ONCE DAILY TAKE WITH OR IMMEDIATELY FOLLOWING A MEAL 90 tablet 3  . Multiple Vitamin (MULTIVITAMIN) capsule Take 1 capsule by mouth daily.    . traZODone (DESYREL) 150 MG tablet 1 or 2 at bedtime for sleep 180 tablet 1  . valsartan-hydrochlorothiazide (DIOVAN-HCT) 320-25 MG tablet Take 1 tablet by mouth daily. 90 tablet 3  . celecoxib (CELEBREX) 200 MG capsule Take 1 capsule (200 mg total) by mouth daily. With food (Patient not taking: Reported on 08/16/2017) 30 capsule 5   No current facility-administered medications on file prior to visit.     ROS Review of Systems  Constitutional: Negative.   HENT: Negative for congestion.   Eyes: Negative for visual disturbance.  Respiratory: Negative for shortness of breath.   Cardiovascular: Negative for chest pain.  Gastrointestinal: Negative for abdominal pain, constipation, diarrhea, nausea and vomiting.  Genitourinary: Negative for difficulty urinating.  Musculoskeletal: Positive for myalgias (Patient reports a 3/10 pain in the right thigh.  It is a shooting sensation it is intermittent.  She is no longer taking  Celebrex or gabapentin, but the pain is tolerable and she does not want to resume either medicine.). Negative for arthralgias.  Neurological: Negative for headaches.  Psychiatric/Behavioral: Negative for sleep disturbance.    Objective:  BP 113/66   Pulse 64   Ht _0  (1.575 m)   Wt 163 lb (73.9 kg)   BMI 29.81 kg/m   BP  Readings from Last 3 Encounters:  08/16/17 113/66  07/04/17 120/70  05/17/17 129/81    Wt Readings from Last 3 Encounters:  08/16/17 163 lb (73.9 kg)  07/04/17 161 lb (73 kg)  05/17/17 156 lb (70.8 kg)     Physical Exam  Constitutional: She is oriented to person, place, and time. She appears well-developed and well-nourished. No distress.  HENT:  Head: Normocephalic and atraumatic.  Right Ear: External ear normal.  Left Ear: External ear normal.  Nose: Nose normal.  Mouth/Throat: Oropharynx is clear and moist. No oropharyngeal exudate.  Eyes: Pupils are equal, round, and reactive to light. Conjunctivae and EOM are normal. Right eye exhibits no discharge. Left eye exhibits no discharge. No scleral icterus.  Neck: Normal range of motion. Neck supple. No JVD present. No thyromegaly present.  Cardiovascular: Normal rate, regular rhythm, normal heart sounds and intact distal pulses. Exam reveals no gallop and no friction rub.  No murmur heard. Pulmonary/Chest: Effort normal and breath sounds normal. No stridor. No respiratory distress. She has no wheezes. She has no rales. She exhibits no tenderness.  Abdominal: Soft. Bowel sounds are normal. There is no tenderness.  Musculoskeletal: Normal range of motion.  Lymphadenopathy:    She has no cervical adenopathy.  Neurological: She is alert and oriented to person, place, and time. She displays normal reflexes. No cranial nerve deficit. She exhibits normal muscle tone. Coordination normal.  Skin: Skin is warm and dry. She is not diaphoretic.  Psychiatric: She has a normal mood and affect. Her behavior is normal.  Vitals reviewed.   Diabetic Foot Exam - Simple   No data filed        Assessment & Plan:   Marilyn Ford was seen today for diabetes.  Diagnoses and all orders for this visit:  Controlled type 2 diabetes mellitus with other circulatory complication, without long-term current use of insulin (HCC) -     CBC with  Differential/Platelet -     CMP14+EGFR -     Microalbumin / creatinine urine ratio -     Urinalysis -     Bayer DCA Hb A1c Waived  Essential hypertension  Mixed hyperlipidemia  Other orders -     atorvastatin (LIPITOR) 80 MG tablet; TAKE 1 TABLET DAILY AT 6 P.M. -     clopidogrel (PLAVIX) 75 MG tablet; Take 1 tablet (75 mg total) by mouth daily.   I have discontinued Marilyn Ford. Marilyn Ford's gabapentin. I am also having her maintain her multivitamin, aspirin, amLODipine, metformin, metoprolol succinate, valsartan-hydrochlorothiazide, celecoxib, traZODone, atorvastatin, and clopidogrel.  Meds ordered this encounter  Medications  . atorvastatin (LIPITOR) 80 MG tablet    Sig: TAKE 1 TABLET DAILY AT 6 P.M.    Dispense:  90 tablet    Refill:  1  . clopidogrel (PLAVIX) 75 MG tablet    Sig: Take 1 tablet (75 mg total) by mouth daily.    Dispense:  90 tablet    Refill:  1     Follow-up: Return in about 3 months (around 11/15/2017).  Claretta Fraise, M.D.

## 2017-08-21 LAB — COLOGUARD: Cologuard: POSITIVE

## 2017-09-03 LAB — HM MAMMOGRAPHY

## 2017-09-07 ENCOUNTER — Telehealth: Payer: Self-pay | Admitting: Family Medicine

## 2017-09-07 NOTE — Telephone Encounter (Signed)
Aware.  We do not have result.

## 2017-09-18 ENCOUNTER — Other Ambulatory Visit: Payer: Self-pay | Admitting: *Deleted

## 2017-09-18 DIAGNOSIS — R195 Other fecal abnormalities: Secondary | ICD-10-CM

## 2017-09-26 ENCOUNTER — Ambulatory Visit (INDEPENDENT_AMBULATORY_CARE_PROVIDER_SITE_OTHER): Payer: 59 | Admitting: Internal Medicine

## 2017-11-18 ENCOUNTER — Ambulatory Visit: Payer: 59 | Admitting: Family Medicine

## 2017-11-22 ENCOUNTER — Ambulatory Visit: Payer: 59 | Admitting: Family Medicine

## 2017-12-10 ENCOUNTER — Ambulatory Visit: Payer: 59 | Admitting: Family Medicine

## 2017-12-17 ENCOUNTER — Ambulatory Visit: Payer: 59 | Admitting: Family Medicine

## 2017-12-27 ENCOUNTER — Encounter: Payer: Self-pay | Admitting: Family Medicine

## 2017-12-27 ENCOUNTER — Ambulatory Visit (INDEPENDENT_AMBULATORY_CARE_PROVIDER_SITE_OTHER): Payer: Medicare HMO | Admitting: Family Medicine

## 2017-12-27 VITALS — BP 131/73 | HR 75 | Temp 97.1°F | Ht 62.0 in | Wt 166.5 lb

## 2017-12-27 DIAGNOSIS — I10 Essential (primary) hypertension: Secondary | ICD-10-CM | POA: Diagnosis not present

## 2017-12-27 DIAGNOSIS — E1159 Type 2 diabetes mellitus with other circulatory complications: Secondary | ICD-10-CM

## 2017-12-27 DIAGNOSIS — E782 Mixed hyperlipidemia: Secondary | ICD-10-CM

## 2017-12-27 LAB — URINALYSIS
Bilirubin, UA: NEGATIVE
Glucose, UA: NEGATIVE
Ketones, UA: NEGATIVE
LEUKOCYTES UA: NEGATIVE
Nitrite, UA: NEGATIVE
PH UA: 5.5 (ref 5.0–7.5)
PROTEIN UA: NEGATIVE
RBC, UA: NEGATIVE
SPEC GRAV UA: 1.01 (ref 1.005–1.030)
Urobilinogen, Ur: 0.2 mg/dL (ref 0.2–1.0)

## 2017-12-27 LAB — BAYER DCA HB A1C WAIVED: HB A1C (BAYER DCA - WAIVED): 6.8 % (ref <7.0)

## 2017-12-27 MED ORDER — METFORMIN HCL ER (OSM) 1000 MG PO TB24: 1000.0000 mg | ORAL_TABLET | Freq: Every day | ORAL | 3 refills | Status: DC

## 2017-12-27 MED ORDER — ATORVASTATIN CALCIUM 80 MG PO TABS: ORAL_TABLET | ORAL | 1 refills | Status: DC

## 2017-12-27 MED ORDER — VALSARTAN-HYDROCHLOROTHIAZIDE 320-25 MG PO TABS: 1.0000 | ORAL_TABLET | Freq: Every day | ORAL | 3 refills | Status: DC

## 2017-12-27 MED ORDER — CLOPIDOGREL BISULFATE 75 MG PO TABS: 75.0000 mg | ORAL_TABLET | Freq: Every day | ORAL | 1 refills | Status: DC

## 2017-12-27 MED ORDER — METOPROLOL SUCCINATE ER 50 MG PO TB24: ORAL_TABLET | ORAL | 3 refills | Status: DC

## 2017-12-27 MED ORDER — TRAZODONE HCL 150 MG PO TABS: ORAL_TABLET | ORAL | 1 refills | Status: DC

## 2017-12-27 MED ORDER — AMLODIPINE BESYLATE 10 MG PO TABS: ORAL_TABLET | ORAL | 3 refills | Status: DC

## 2017-12-27 MED ORDER — CELECOXIB 200 MG PO CAPS: 200.0000 mg | ORAL_CAPSULE | Freq: Every day | ORAL | 1 refills | Status: DC

## 2017-12-27 MED ORDER — LANSOPRAZOLE 15 MG PO TBDP: 15.0000 mg | ORAL_TABLET | Freq: Every day | ORAL | 5 refills | Status: DC

## 2017-12-27 NOTE — Patient Instructions (Signed)

## 2017-12-28 LAB — LIPID PANEL
CHOL/HDL RATIO: 3.4 ratio (ref 0.0–4.4)
Cholesterol, Total: 150 mg/dL (ref 100–199)
HDL: 44 mg/dL (ref >39)
LDL Calculated: 47 mg/dL (ref 0–99)
Triglycerides: 293 mg/dL — ABNORMAL HIGH (ref 0–149)
VLDL Cholesterol Cal: 59 mg/dL — ABNORMAL HIGH (ref 5–40)

## 2017-12-28 LAB — CMP14+EGFR
A/G RATIO: 1.5 (ref 1.2–2.2)
ALBUMIN: 4.8 g/dL (ref 3.5–5.5)
ALT: 39 IU/L — ABNORMAL HIGH (ref 0–32)
AST: 25 IU/L (ref 0–40)
Alkaline Phosphatase: 124 IU/L — ABNORMAL HIGH (ref 39–117)
BUN / CREAT RATIO: 14 (ref 9–23)
BUN: 12 mg/dL (ref 6–24)
Bilirubin Total: 0.3 mg/dL (ref 0.0–1.2)
CALCIUM: 10.1 mg/dL (ref 8.7–10.2)
CO2: 24 mmol/L (ref 20–29)
Chloride: 97 mmol/L (ref 96–106)
Creatinine, Ser: 0.86 mg/dL (ref 0.57–1.00)
GFR, EST AFRICAN AMERICAN: 87 mL/min/{1.73_m2} (ref >59)
GFR, EST NON AFRICAN AMERICAN: 76 mL/min/{1.73_m2} (ref >59)
GLOBULIN, TOTAL: 3.2 g/dL (ref 1.5–4.5)
Glucose: 159 mg/dL — ABNORMAL HIGH (ref 65–99)
POTASSIUM: 4 mmol/L (ref 3.5–5.2)
SODIUM: 140 mmol/L (ref 134–144)
TOTAL PROTEIN: 8 g/dL (ref 6.0–8.5)

## 2017-12-29 ENCOUNTER — Encounter: Payer: Self-pay | Admitting: Family Medicine

## 2017-12-29 NOTE — Progress Notes (Signed)
Subjective:  Patient ID: Marilyn Ford,  female    DOB: 06-26-1960  Age: 57 y.o.    CC: Medical Management of Chronic Issues   HPI MALAIJAH HOUCHEN presents for  follow-up of hypertension. Patient has no history of headache chest pain or shortness of breath or recent cough. Patient also denies symptoms of TIA such as numbness weakness lateralizing. Patient denies side effects from medication. States taking it regularly.  Patient also  in for follow-up of elevated cholesterol. Doing well without complaints on current medication. Denies side effects  including myalgia and arthralgia and nausea. Also in today for liver function testing. Currently no chest pain, shortness of breath or other cardiovascular related symptoms noted.  Follow-up of diabetes. Patient does check blood sugar at home. Readings running low to mid 100s. Patient denies symptoms such as excessive hunger or urinary frequency, excessive hunger, nausea No significant hypoglycemic spells noted. Medications reviewed. Pt reports taking them regularly. Pt. denies complication/adverse reaction today.    History Kayci has a past medical history of Diabetes mellitus without complication ([place]), Hypertension, Stroke ([place]), and Vaginal Pap smear, abnormal.   She has a past surgical history that includes Cholecystectomy and Cesarean section.   Her family history includes Diabetes in her brother and mother.She reports that she quit smoking about 2 years ago. Her smoking use included cigarettes. She started smoking about 18 years ago. She smoked 0.50 packs per day. She has never used smokeless tobacco. She reports that she does not drink alcohol or use drugs.  Current Outpatient Medications on File Prior to Visit  Medication Sig Dispense Refill  . aspirin EC 81 MG EC tablet Take 1 tablet (81 mg total) by mouth daily. 30 tablet 0  . Multiple Vitamin (MULTIVITAMIN) capsule Take 1 capsule by mouth daily.     No current  facility-administered medications on file prior to visit.     ROS Review of Systems  Constitutional: Negative.   HENT: Negative for congestion.   Eyes: Negative for visual disturbance.  Respiratory: Negative for shortness of breath.   Cardiovascular: Negative for chest pain.  Gastrointestinal: Negative for abdominal pain, constipation, diarrhea, nausea and vomiting.  Genitourinary: Negative for difficulty urinating.  Musculoskeletal: Negative for arthralgias and myalgias.  Neurological: Negative for headaches.  Psychiatric/Behavioral: Negative for sleep disturbance.    Objective:  BP 131/73   Pulse 75   Temp (!) 97.1 F (36.2 C) (Oral)   Ht '5\' 2"'  (1.575 m)   Wt 166 lb 8 oz (75.5 kg)   BMI 30.45 kg/m   BP Readings from Last 3 Encounters:  12/27/17 131/73  08/16/17 113/66  07/04/17 120/70    Wt Readings from Last 3 Encounters:  12/27/17 166 lb 8 oz (75.5 kg)  08/16/17 163 lb (73.9 kg)  07/04/17 161 lb (73 kg)     Physical Exam  Constitutional: She is oriented to person, place, and time. She appears well-developed and well-nourished. No distress.  HENT:  Head: Normocephalic and atraumatic.  Right Ear: External ear normal.  Left Ear: External ear normal.  Nose: Nose normal.  Mouth/Throat: Oropharynx is clear and moist.  Eyes: Pupils are equal, round, and reactive to light. Conjunctivae and EOM are normal.  Neck: Normal range of motion. Neck supple. No thyromegaly present.  Cardiovascular: Normal rate, regular rhythm and normal heart sounds.  No murmur heard. Pulmonary/Chest: Effort normal and breath sounds normal. No respiratory distress. She has no wheezes. She has no rales.  Abdominal: Soft. Bowel sounds  are normal. She exhibits no distension. There is no tenderness.  Lymphadenopathy:    She has no cervical adenopathy.  Neurological: She is alert and oriented to person, place, and time. She has normal reflexes.  Skin: Skin is warm and dry.  Psychiatric: She has  a normal mood and affect. Her behavior is normal. Judgment and thought content normal.    Diabetic Foot Exam - Simple   No data filed        Assessment & Plan:   Conna was seen today for medical management of chronic issues.  Diagnoses and all orders for this visit:  Controlled type 2 diabetes mellitus with other circulatory complication, without long-term current use of insulin (HCC) -     Bayer DCA Hb A1c Waived -     Urinalysis  Mixed hyperlipidemia -     CMP14+EGFR -     Lipid panel  Essential hypertension  Other orders -     amLODipine (NORVASC) 10 MG tablet; TAKE ONE TABLET BY MOUTH ONCE DAILY FOR BLOOD PRESSURE -     atorvastatin (LIPITOR) 80 MG tablet; TAKE 1 TABLET DAILY AT 6 P.M. -     celecoxib (CELEBREX) 200 MG capsule; Take 1 capsule (200 mg total) by mouth daily. With food -     clopidogrel (PLAVIX) 75 MG tablet; Take 1 tablet (75 mg total) by mouth daily. -     metformin (FORTAMET) 1000 MG (OSM) 24 hr tablet; Take 1 tablet (1,000 mg total) by mouth daily with breakfast. -     metoprolol succinate (TOPROL-XL) 50 MG 24 hr tablet; TAKE ONE TABLET BY MOUTH ONCE DAILY TAKE WITH OR IMMEDIATELY FOLLOWING A MEAL -     traZODone (DESYREL) 150 MG tablet; 1 or 2 at bedtime for sleep -     valsartan-hydrochlorothiazide (DIOVAN-HCT) 320-25 MG tablet; Take 1 tablet by mouth daily. -     lansoprazole (PREVACID SOLUTAB) 15 MG disintegrating tablet; Take 1 tablet (15 mg total) by mouth daily at 12 noon.   I am having Ciara Kagan. [place] start on lansoprazole. I am also having her maintain her multivitamin, aspirin, amLODipine, atorvastatin, celecoxib, clopidogrel, metformin, metoprolol succinate, traZODone, and valsartan-hydrochlorothiazide.  Meds ordered this encounter  Medications  . amLODipine (NORVASC) 10 MG tablet    Sig: TAKE ONE TABLET BY MOUTH ONCE DAILY FOR BLOOD PRESSURE    Dispense:  90 tablet    Refill:  3  . atorvastatin (LIPITOR) 80 MG tablet    Sig: TAKE 1  TABLET DAILY AT 6 P.M.    Dispense:  90 tablet    Refill:  1  . celecoxib (CELEBREX) 200 MG capsule    Sig: Take 1 capsule (200 mg total) by mouth daily. With food    Dispense:  90 capsule    Refill:  1  . clopidogrel (PLAVIX) 75 MG tablet    Sig: Take 1 tablet (75 mg total) by mouth daily.    Dispense:  90 tablet    Refill:  1  . metformin (FORTAMET) 1000 MG (OSM) 24 hr tablet    Sig: Take 1 tablet (1,000 mg total) by mouth daily with breakfast.    Dispense:  90 tablet    Refill:  3  . metoprolol succinate (TOPROL-XL) 50 MG 24 hr tablet    Sig: TAKE ONE TABLET BY MOUTH ONCE DAILY TAKE WITH OR IMMEDIATELY FOLLOWING A MEAL    Dispense:  90 tablet    Refill:  3  . traZODone (DESYREL) 150  MG tablet    Sig: 1 or 2 at bedtime for sleep    Dispense:  180 tablet    Refill:  1  . valsartan-hydrochlorothiazide (DIOVAN-HCT) 320-25 MG tablet    Sig: Take 1 tablet by mouth daily.    Dispense:  90 tablet    Refill:  3  . lansoprazole (PREVACID SOLUTAB) 15 MG disintegrating tablet    Sig: Take 1 tablet (15 mg total) by mouth daily at 12 noon.    Dispense:  30 tablet    Refill:  5     Follow-up: Return in about 3 months (around 03/29/2018).  Claretta Fraise, M.D.

## 2018-01-15 ENCOUNTER — Other Ambulatory Visit: Payer: Self-pay | Admitting: *Deleted

## 2018-01-15 ENCOUNTER — Telehealth: Payer: Self-pay | Admitting: Family Medicine

## 2018-01-15 DIAGNOSIS — Z1211 Encounter for screening for malignant neoplasm of colon: Secondary | ICD-10-CM

## 2018-01-15 NOTE — Telephone Encounter (Signed)
Pt is rtn  call 

## 2018-01-15 NOTE — Telephone Encounter (Signed)
Patient aware that referral was placed in may to [employer].  Patient will just need to schedule an appoinment

## 2018-02-03 ENCOUNTER — Ambulatory Visit: Payer: Medicare HMO

## 2018-02-04 ENCOUNTER — Telehealth (INDEPENDENT_AMBULATORY_CARE_PROVIDER_SITE_OTHER): Payer: Self-pay | Admitting: *Deleted

## 2018-02-04 ENCOUNTER — Telehealth: Payer: Self-pay | Admitting: Family Medicine

## 2018-02-04 ENCOUNTER — Ambulatory Visit (INDEPENDENT_AMBULATORY_CARE_PROVIDER_SITE_OTHER): Payer: Medicare HMO | Admitting: Internal Medicine

## 2018-02-04 ENCOUNTER — Encounter (INDEPENDENT_AMBULATORY_CARE_PROVIDER_SITE_OTHER): Payer: Self-pay | Admitting: Internal Medicine

## 2018-02-04 ENCOUNTER — Encounter (INDEPENDENT_AMBULATORY_CARE_PROVIDER_SITE_OTHER): Payer: Self-pay | Admitting: *Deleted

## 2018-02-04 VITALS — BP 148/92 | HR 60 | Temp 97.4°F | Ht 62.0 in | Wt 168.9 lb

## 2018-02-04 DIAGNOSIS — R195 Other fecal abnormalities: Secondary | ICD-10-CM | POA: Insufficient documentation

## 2018-02-04 MED ORDER — SUPREP BOWEL PREP KIT 17.5-3.13-1.6 GM/177ML PO SOLN: 1.0000 | Freq: Once | ORAL | 0 refills | Status: AC

## 2018-02-04 NOTE — Patient Instructions (Signed)
The risks of bleeding, perforation and infection were reviewed with patient.  

## 2018-02-04 NOTE — Telephone Encounter (Signed)
That will be okay Thanks, WS

## 2018-02-04 NOTE — Telephone Encounter (Signed)
Patient aware.

## 2018-02-04 NOTE — Progress Notes (Signed)
   Subjective:    Patient ID: Marilyn Ford, female    DOB: 05-22-1960, 57 y.o.   MRN: [id number]  HPI Referred by Dr. Quinn Axe for positive cologuard. Positive cologuard 08/09/2017.  No change in her stool. Has a BM. Has not seen any [facility] or melena. Appetite is good. No weight loss. No abdominal pain. Has never undergone a colonoscopy in the past. No family hx of colon cancer.  Hx of CVA 3 yrs ago and maintained on Plavix and ASA    Review of Systems Past Medical History:  Diagnosis Date  . Diabetes mellitus without complication ([place])   . Hypertension   . Stroke ([place])   . Vaginal Pap smear, abnormal     Past Surgical History:  Procedure Laterality Date  . CESAREAN SECTION    . CHOLECYSTECTOMY      Allergies  Allergen Reactions  . Penicillins Shortness Of Breath    Current Outpatient Medications on File Prior to Visit  Medication Sig Dispense Refill  . amLODipine (NORVASC) 10 MG tablet TAKE ONE TABLET BY MOUTH ONCE DAILY FOR BLOOD PRESSURE 90 tablet 3  . aspirin EC 81 MG EC tablet Take 1 tablet (81 mg total) by mouth daily. 30 tablet 0  . atorvastatin (LIPITOR) 80 MG tablet TAKE 1 TABLET DAILY AT 6 P.M. 90 tablet 1  . celecoxib (CELEBREX) 200 MG capsule Take 1 capsule (200 mg total) by mouth daily. With food 90 capsule 1  . clopidogrel (PLAVIX) 75 MG tablet Take 1 tablet (75 mg total) by mouth daily. 90 tablet 1  . lansoprazole (PREVACID SOLUTAB) 15 MG disintegrating tablet Take 1 tablet (15 mg total) by mouth daily at 12 noon. 30 tablet 5  . metformin (FORTAMET) 1000 MG (OSM) 24 hr tablet Take 1 tablet (1,000 mg total) by mouth daily with breakfast. 90 tablet 3  . metoprolol succinate (TOPROL-XL) 50 MG 24 hr tablet TAKE ONE TABLET BY MOUTH ONCE DAILY TAKE WITH OR IMMEDIATELY FOLLOWING A MEAL 90 tablet 3  . Multiple Vitamin (MULTIVITAMIN) capsule Take 1 capsule by mouth daily.    . traZODone (DESYREL) 150 MG tablet 1 or 2 at bedtime for sleep 180 tablet 1  .  valsartan-hydrochlorothiazide (DIOVAN-HCT) 320-25 MG tablet Take 1 tablet by mouth daily. 90 tablet 3   No current facility-administered medications on file prior to visit.          Objective:   Physical Exam Blood pressure (!) 148/92, pulse 60, temperature (!) 97.4 F (36.3 C), height 5\' 2"  (1.575 m), weight 168 lb 14.4 oz (76.6 kg). Alert and oriented. Skin warm and dry. Oral mucosa is moist.   . Sclera anicteric, conjunctivae is pink. Thyroid not enlarged. No cervical lymphadenopathy. Lungs clear. Heart regular rate and rhythm.  Abdomen is soft. Bowel sounds are positive. No hepatomegaly. No abdominal masses felt. No tenderness.  No edema to lower extremities.           Assessment & Plan:  Positive cologuard. Colonic carcinoma needs to be ruled out. The risks of bleeding, perforation and infection were reviewed with patient.

## 2018-02-04 NOTE — Telephone Encounter (Signed)
Patient needs suprep 

## 2018-03-12 ENCOUNTER — Encounter (INDEPENDENT_AMBULATORY_CARE_PROVIDER_SITE_OTHER): Payer: Self-pay | Admitting: *Deleted

## 2018-03-31 ENCOUNTER — Ambulatory Visit: Payer: Medicare HMO | Admitting: Family Medicine

## 2018-04-07 ENCOUNTER — Telehealth: Payer: Self-pay | Admitting: *Deleted

## 2018-04-07 NOTE — Telephone Encounter (Signed)
Pt now aware Metformin ER not covered by insurance Please advise on alternative, she has enough for this wk & next [facility] mail order

## 2018-04-07 NOTE — Telephone Encounter (Signed)
Pt called about Metformin refill Rx was sent to [facility] 12/27/17 #90 w/ 3 RFs TC to [facility] the ER is not covered by the patient's insurance they were not able to get in contact w/ her. LMOVM for pt to call back regarding this

## 2018-04-07 NOTE — Telephone Encounter (Signed)
Okay to refill all meds for 6 mos 

## 2018-04-07 NOTE — Telephone Encounter (Signed)
Pt called back after talking to [facility] They will pay for the 500 mg BID Please advise

## 2018-04-08 MED ORDER — METFORMIN HCL 500 MG PO TABS: 500.0000 mg | ORAL_TABLET | Freq: Two times a day (BID) | ORAL | 1 refills | Status: DC

## 2018-04-08 NOTE — Addendum Note (Signed)
Addended by: Marylin Crosby on: 04/08/2018 11:37 AM   Modules accepted: Orders

## 2018-04-08 NOTE — Telephone Encounter (Signed)
Left message advising pt to call back. Rx sent to [facility] per Dr Livia Snellen.

## 2018-04-08 NOTE — Telephone Encounter (Signed)
Please change to immediate release and ask the patient to monitor for diarrhea as a potential side effect. Thanks, WS

## 2018-04-08 NOTE — Telephone Encounter (Signed)
Her insurance will only cover Metformin 500mg  BID not the Metformin ER. Is it ok to change?

## 2018-04-14 NOTE — Telephone Encounter (Signed)
Attempt to contact pt without return call in over 3 days, will close encounter. 

## 2018-04-16 ENCOUNTER — Ambulatory Visit: Payer: Medicare HMO | Admitting: Family Medicine

## 2018-05-21 ENCOUNTER — Encounter (HOSPITAL_COMMUNITY): Admission: RE | Payer: Self-pay | Source: Ambulatory Visit

## 2018-05-21 ENCOUNTER — Ambulatory Visit (HOSPITAL_COMMUNITY): Admission: RE | Admit: 2018-05-21 | Payer: Medicare HMO | Source: Ambulatory Visit | Admitting: Internal Medicine

## 2018-05-21 SURGERY — COLONOSCOPY
Anesthesia: Moderate Sedation

## 2018-05-22 ENCOUNTER — Other Ambulatory Visit: Payer: Self-pay | Admitting: Family Medicine

## 2018-06-02 ENCOUNTER — Encounter: Payer: Self-pay | Admitting: Family Medicine

## 2018-06-02 ENCOUNTER — Ambulatory Visit (INDEPENDENT_AMBULATORY_CARE_PROVIDER_SITE_OTHER): Payer: Medicare HMO | Admitting: Family Medicine

## 2018-06-02 VITALS — BP 158/87 | HR 85 | Temp 97.8°F | Ht 62.0 in | Wt 174.1 lb

## 2018-06-02 DIAGNOSIS — I10 Essential (primary) hypertension: Secondary | ICD-10-CM

## 2018-06-02 DIAGNOSIS — E1159 Type 2 diabetes mellitus with other circulatory complications: Secondary | ICD-10-CM

## 2018-06-02 DIAGNOSIS — E782 Mixed hyperlipidemia: Secondary | ICD-10-CM

## 2018-06-02 LAB — BAYER DCA HB A1C WAIVED: HB A1C (BAYER DCA - WAIVED): 6.6 % (ref <7.0)

## 2018-06-02 MED ORDER — ATORVASTATIN CALCIUM 80 MG PO TABS: ORAL_TABLET | ORAL | 1 refills | Status: DC

## 2018-06-02 MED ORDER — CLOPIDOGREL BISULFATE 75 MG PO TABS: 75.0000 mg | ORAL_TABLET | Freq: Every day | ORAL | 1 refills | Status: DC

## 2018-06-02 MED ORDER — TRAZODONE HCL 150 MG PO TABS: ORAL_TABLET | ORAL | 1 refills | Status: DC

## 2018-06-02 MED ORDER — CELECOXIB 200 MG PO CAPS: 200.0000 mg | ORAL_CAPSULE | Freq: Every day | ORAL | 1 refills | Status: DC

## 2018-06-02 NOTE — Progress Notes (Signed)
Subjective:  Patient ID: Marilyn Ford,  female    DOB: 26-Mar-1961  Age: 58 y.o.    CC: Medical Management of Chronic Issues   HPI Marilyn Ford presents for  follow-up of hypertension. Patient has no history of headache chest pain or shortness of breath or recent cough. Patient also denies symptoms of TIA such as numbness weakness lateralizing. Patient denies side effects from medication. States taking it regularly. BP at home usually 120/70. Pt. Has hx of CVA from when she had uncontrolled BP.Prefers to increase med since BP  Is up in office.  Patient also  in for follow-up of elevated cholesterol. Doing well without complaints on current medication. Denies side effects  including myalgia and arthralgia and nausea. Also in today for liver function testing. Currently no chest pain, shortness of breath or other cardiovascular related symptoms noted.  Follow-up of diabetes. Patient does check blood sugar at home. Readings run around 170.  PAtient says her appetite is back. She likes to eat. Putting on weight. Loves to go to the [employer]. Can eat a whole bowl of chips. No significant hypoglycemic spells noted. Medications reviewed. Pt reports taking them regularly. Pt. denies complication/adverse reaction today.  Depression screen [facility] 2/9 06/02/2018 12/27/2017 08/16/2017 07/04/2017 05/17/2017  Decreased Interest 1 1 0 0 0  Down, Depressed, Hopeless 0 1 1 0 0  PHQ - 2 Score _0 0 0  Altered sleeping - 2 - 0 -  Tired, decreased energy - 1 - 0 -  Change in appetite - 2 - 1 -  Feeling bad or failure about yourself  - 2 - 0 -  Trouble concentrating - 1 - 0 -  Moving slowly or fidgety/restless - 2 - 0 -  Suicidal thoughts - 0 - 0 -  PHQ-9 Score - 12 - 1 -  Difficult doing work/chores - - - Not difficult at all -     History Marilyn Ford has a past medical history of Diabetes mellitus without complication ([place]), Hypertension, Stroke ([place]), and Vaginal Pap smear, abnormal.    She has a past surgical history that includes Cholecystectomy and Cesarean section.   Her family history includes Diabetes in her brother and mother.She reports that she quit smoking about 3 years ago. Her smoking use included cigarettes. She started smoking about 18 years ago. She smoked 0.50 packs per day. She has never used smokeless tobacco. She reports that she does not drink alcohol or use drugs.  Current Outpatient Medications on File Prior to Visit  Medication Sig Dispense Refill  . amLODipine (NORVASC) 10 MG tablet TAKE ONE TABLET BY MOUTH ONCE DAILY FOR BLOOD PRESSURE 90 tablet 3  . aspirin EC 81 MG EC tablet Take 1 tablet (81 mg total) by mouth daily. 30 tablet 0  . metFORMIN (GLUCOPHAGE) 500 MG tablet Take 1 tablet (500 mg total) by mouth 2 (two) times daily with a meal. 180 tablet 1  . metoprolol succinate (TOPROL-XL) 50 MG 24 hr tablet TAKE ONE TABLET BY MOUTH ONCE DAILY TAKE WITH OR IMMEDIATELY FOLLOWING A MEAL 90 tablet 3  . Multiple Vitamin (MULTIVITAMIN) capsule Take 1 capsule by mouth daily.    . valsartan-hydrochlorothiazide (DIOVAN-HCT) 320-25 MG tablet Take 1 tablet by mouth daily. 90 tablet 3   No current facility-administered medications on file prior to visit.     ROS Review of Systems  Constitutional: Negative.   HENT: Negative for congestion.   Eyes: Negative for visual disturbance.  Respiratory: Negative for shortness of breath.   Cardiovascular: Negative for chest pain.  Gastrointestinal: Negative for abdominal pain, constipation, diarrhea, nausea and vomiting.  Genitourinary: Negative for difficulty urinating.  Musculoskeletal: Negative for arthralgias and myalgias.  Neurological: Negative for headaches.  Psychiatric/Behavioral: Negative for sleep disturbance.    Objective:  BP (!) 158/87   Pulse 85   Temp 97.8 F (36.6 C) (Oral)   Ht _0  (1.575 m)   Wt 174 lb 2 oz (79 kg)   BMI 31.85 kg/m   BP Readings from Last 3 Encounters:  06/02/18 (!)  158/87  02/04/18 (!) 148/92  12/27/17 131/73    Wt Readings from Last 3 Encounters:  06/02/18 174 lb 2 oz (79 kg)  02/04/18 168 lb 14.4 oz (76.6 kg)  12/27/17 166 lb 8 oz (75.5 kg)     Physical Exam Constitutional:      General: She is not in acute distress.    Appearance: She is well-developed.  HENT:     Head: Normocephalic and atraumatic.     Right Ear: External ear normal.     Left Ear: External ear normal.     Nose: Nose normal.  Eyes:     Conjunctiva/sclera: Conjunctivae normal.     Pupils: Pupils are equal, round, and reactive to light.  Neck:     Musculoskeletal: Normal range of motion and neck supple.     Thyroid: No thyromegaly.  Cardiovascular:     Rate and Rhythm: Normal rate and regular rhythm.     Heart sounds: Normal heart sounds. No murmur.  Pulmonary:     Effort: Pulmonary effort is normal. No respiratory distress.     Breath sounds: Normal breath sounds. No wheezing or rales.  Abdominal:     General: Bowel sounds are normal. There is no distension.     Palpations: Abdomen is soft.     Tenderness: There is no abdominal tenderness.  Lymphadenopathy:     Cervical: No cervical adenopathy.  Skin:    General: Skin is warm and dry.  Neurological:     Mental Status: She is alert and oriented to person, place, and time.     Deep Tendon Reflexes: Reflexes are normal and symmetric.  Psychiatric:        Behavior: Behavior normal.        Thought Content: Thought content normal.        Judgment: Judgment normal.     Diabetic Foot Exam - Simple   No data filed        Assessment & Plan:   Skilynn was seen today for medical management of chronic issues.  Diagnoses and all orders for this visit:  Controlled type 2 diabetes mellitus with other circulatory complication, without long-term current use of insulin (HCC) -     CBC with Differential/Platelet -     CMP14+EGFR -     Bayer DCA Hb A1c Waived  Mixed hyperlipidemia -     Lipid panel  Essential  hypertension  Other orders -     atorvastatin (LIPITOR) 80 MG tablet; TAKE 1 TABLET DAILY AT 6 P.M. -     celecoxib (CELEBREX) 200 MG capsule; Take 1 capsule (200 mg total) by mouth daily. With food -     clopidogrel (PLAVIX) 75 MG tablet; Take 1 tablet (75 mg total) by mouth daily. -     traZODone (DESYREL) 150 MG tablet; TAKE 1 OR 2 TABLETS AT BEDTIME FOR SLEEP   I have discontinued Yanel  T. Scheibe's lansoprazole. I have also changed her celecoxib. Additionally, I am having her maintain her multivitamin, aspirin, amLODipine, metoprolol succinate, valsartan-hydrochlorothiazide, metFORMIN, atorvastatin, clopidogrel, and traZODone.  Meds ordered this encounter  Medications  . atorvastatin (LIPITOR) 80 MG tablet    Sig: TAKE 1 TABLET DAILY AT 6 P.M.    Dispense:  90 tablet    Refill:  1  . celecoxib (CELEBREX) 200 MG capsule    Sig: Take 1 capsule (200 mg total) by mouth daily. With food    Dispense:  90 capsule    Refill:  1  . clopidogrel (PLAVIX) 75 MG tablet    Sig: Take 1 tablet (75 mg total) by mouth daily.    Dispense:  90 tablet    Refill:  1  . traZODone (DESYREL) 150 MG tablet    Sig: TAKE 1 OR 2 TABLETS AT BEDTIME FOR SLEEP    Dispense:  180 tablet    Refill:  1   Pt. Was encouraged to avoid high carb foods. Strategies reviewed. Also avoid salt/sodium  Follow-up: Return in about 3 months (around 09/01/2018).  Claretta Fraise, M.D.

## 2018-06-03 LAB — CBC WITH DIFFERENTIAL/PLATELET
Basophils Absolute: 0.1 10*3/uL (ref 0.0–0.2)
Basos: 1 %
EOS (ABSOLUTE): 0.2 10*3/uL (ref 0.0–0.4)
Eos: 2 %
Hematocrit: 38.5 % (ref 34.0–46.6)
Hemoglobin: 13.5 g/dL (ref 11.1–15.9)
Immature Grans (Abs): 0 10*3/uL (ref 0.0–0.1)
Immature Granulocytes: 0 %
Lymphocytes Absolute: 1.8 10*3/uL (ref 0.7–3.1)
Lymphs: 22 %
MCH: 32.1 pg (ref 26.6–33.0)
MCHC: 35.1 g/dL (ref 31.5–35.7)
MCV: 91 fL (ref 79–97)
Monocytes Absolute: 0.6 10*3/uL (ref 0.1–0.9)
Monocytes: 7 %
Neutrophils Absolute: 5.4 10*3/uL (ref 1.4–7.0)
Neutrophils: 68 %
Platelets: 249 10*3/uL (ref 150–450)
RBC: 4.21 x10E6/uL (ref 3.77–5.28)
RDW: 12.8 % (ref 11.7–15.4)
WBC: 8 10*3/uL (ref 3.4–10.8)

## 2018-06-03 LAB — CMP14+EGFR
ALBUMIN: 4.7 g/dL (ref 3.8–4.9)
ALT: 30 IU/L (ref 0–32)
AST: 24 IU/L (ref 0–40)
Albumin/Globulin Ratio: 1.6 (ref 1.2–2.2)
Alkaline Phosphatase: 108 IU/L (ref 39–117)
BUN/Creatinine Ratio: 15 (ref 9–23)
BUN: 15 mg/dL (ref 6–24)
Bilirubin Total: 0.4 mg/dL (ref 0.0–1.2)
CO2: 23 mmol/L (ref 20–29)
Calcium: 10.5 mg/dL — ABNORMAL HIGH (ref 8.7–10.2)
Chloride: 100 mmol/L (ref 96–106)
Creatinine, Ser: 1 mg/dL (ref 0.57–1.00)
GFR calc Af Amer: 72 mL/min/{1.73_m2} (ref >59)
GFR calc non Af Amer: 63 mL/min/{1.73_m2} (ref >59)
Globulin, Total: 2.9 g/dL (ref 1.5–4.5)
Glucose: 143 mg/dL — ABNORMAL HIGH (ref 65–99)
POTASSIUM: 4.8 mmol/L (ref 3.5–5.2)
SODIUM: 143 mmol/L (ref 134–144)
Total Protein: 7.6 g/dL (ref 6.0–8.5)

## 2018-06-03 LAB — LIPID PANEL
Chol/HDL Ratio: 3 ratio (ref 0.0–4.4)
Cholesterol, Total: 147 mg/dL (ref 100–199)
HDL: 49 mg/dL (ref >39)
LDL Calculated: 60 mg/dL (ref 0–99)
Triglycerides: 191 mg/dL — ABNORMAL HIGH (ref 0–149)
VLDL Cholesterol Cal: 38 mg/dL (ref 5–40)

## 2018-06-06 ENCOUNTER — Telehealth: Payer: Self-pay | Admitting: Family Medicine

## 2018-06-06 NOTE — Telephone Encounter (Signed)
Left Dr. Mitzie Na on voicemail

## 2018-06-09 ENCOUNTER — Telehealth: Payer: Self-pay | Admitting: Family Medicine

## 2018-06-19 ENCOUNTER — Telehealth: Payer: Self-pay | Admitting: *Deleted

## 2018-06-19 NOTE — Telephone Encounter (Signed)
When pt was here for OV pt thought BP med was going to be increased (Metoprolol) Pt also requesting med for acid reflux Please advise

## 2018-06-20 ENCOUNTER — Other Ambulatory Visit: Payer: Self-pay | Admitting: Family Medicine

## 2018-06-20 MED ORDER — METOPROLOL SUCCINATE ER 100 MG PO TB24: 100.0000 mg | ORAL_TABLET | Freq: Every day | ORAL | 1 refills | Status: DC

## 2018-06-20 MED ORDER — PANTOPRAZOLE SODIUM 40 MG PO TBEC: 40.0000 mg | DELAYED_RELEASE_TABLET | Freq: Every day | ORAL | 3 refills | Status: DC

## 2018-06-20 NOTE — Telephone Encounter (Signed)
Aware of meds

## 2018-06-20 NOTE — Telephone Encounter (Signed)
I sent in the requested prescription 

## 2018-08-25 ENCOUNTER — Other Ambulatory Visit: Payer: Self-pay | Admitting: Family Medicine

## 2018-08-29 ENCOUNTER — Telehealth: Payer: Self-pay | Admitting: Family Medicine

## 2018-08-29 NOTE — Telephone Encounter (Signed)
Patient aware has appt for next week and can wait till then.

## 2018-09-03 ENCOUNTER — Ambulatory Visit (INDEPENDENT_AMBULATORY_CARE_PROVIDER_SITE_OTHER): Payer: Medicare HMO | Admitting: Family Medicine

## 2018-09-03 ENCOUNTER — Other Ambulatory Visit: Payer: Medicare HMO

## 2018-09-03 ENCOUNTER — Telehealth: Payer: Self-pay | Admitting: Family Medicine

## 2018-09-03 ENCOUNTER — Other Ambulatory Visit: Payer: Self-pay

## 2018-09-03 ENCOUNTER — Encounter: Payer: Self-pay | Admitting: Family Medicine

## 2018-09-03 DIAGNOSIS — E1159 Type 2 diabetes mellitus with other circulatory complications: Secondary | ICD-10-CM

## 2018-09-03 LAB — BAYER DCA HB A1C WAIVED: HB A1C (BAYER DCA - WAIVED): 6.7 % (ref <7.0)

## 2018-09-03 MED ORDER — METFORMIN HCL 500 MG PO TABS: 500.0000 mg | ORAL_TABLET | Freq: Two times a day (BID) | ORAL | 1 refills | Status: DC

## 2018-09-03 NOTE — Telephone Encounter (Signed)
PT is calling to talk to the nurse about her labs, states she came in earlier to have them done.

## 2018-09-03 NOTE — Telephone Encounter (Signed)
Aware.  All labs are not ready for provider to look at.

## 2018-09-03 NOTE — Progress Notes (Signed)
Subjective:    Patient ID: Marilyn Ford, female    DOB: [birth year], 58 y.o.   MRN: [id number]   HPI: [name] is a 58 y.o. female presenting for follow-up of diabetes. Patient does occasionally check blood sugar at home. Glucose 168 had a biscuit and gravy first. Not checking regularly. Finger got sore from sticks. Patient denies symptoms such as polyuria, polydipsia, excessive hunger, nausea No significant hypoglycemic spells noted. Medications as noted below. Taking them regularly without complication/adverse reaction being reported today.  Follow-up of hypertension. Patient has no history of headache chest pain or shortness of breath or recent cough. Patient also denies symptoms of TIA such as numbness weakness lateralizing. Patient checks  blood pressure at home and has not had any elevated readings recently. Patient denies side effects from his medication. States taking it regularly. 138/86 this AM.  Depression screen [facility] 2/9 06/02/2018 12/27/2017 08/16/2017 07/04/2017 05/17/2017  Decreased Interest 1 1 0 0 0  Down, Depressed, Hopeless 0 1 1 0 0  PHQ - 2 Score '1 2 1 ' 0 0  Altered sleeping - 2 - 0 -  Tired, decreased energy - 1 - 0 -  Change in appetite - 2 - 1 -  Feeling bad or failure about yourself  - 2 - 0 -  Trouble concentrating - 1 - 0 -  Moving slowly or fidgety/restless - 2 - 0 -  Suicidal thoughts - 0 - 0 -  PHQ-9 Score - 12 - 1 -  Difficult doing work/chores - - - Not difficult at all -   Pt. Denies sx of depression during this COVID sheltering.  Relevant past medical, surgical, family and social history reviewed and updated as indicated.  Interim medical history since our last visit reviewed. Allergies and medications reviewed and updated.  ROS:  Review of Systems  Constitutional: Negative.   HENT: Negative for congestion.   Eyes: Negative for visual disturbance.  Respiratory: Negative for shortness of breath.   Cardiovascular: Negative for chest pain.   Gastrointestinal: Negative for abdominal pain, constipation, diarrhea, nausea and vomiting.  Genitourinary: Negative for difficulty urinating.  Musculoskeletal: Negative for arthralgias (good control, no pain since starting celebrex) and myalgias.  Neurological: Negative for headaches.  Psychiatric/Behavioral: Negative for dysphoric mood and sleep disturbance.     Social History   Tobacco Use  Smoking Status Former Smoker  . Packs/day: 0.50  . Types: Cigarettes  . Start date: 12/08/1999  . Last attempt to quit: 03/12/2015  . Years since quitting: 3.4  Smokeless Tobacco Never Used       Objective:     Wt Readings from Last 3 Encounters:  06/02/18 174 lb 2 oz (79 kg)  02/04/18 168 lb 14.4 oz (76.6 kg)  12/27/17 166 lb 8 oz (75.5 kg)     Exam deferred. Pt. Harboring due to COVID 19. Phone visit performed.   Assessment & Plan:   1. Controlled type 2 diabetes mellitus with other circulatory complication, without long-term current use of insulin ([place])     Meds ordered this encounter  Medications  . metFORMIN (GLUCOPHAGE) 500 MG tablet    Sig: Take 1 tablet (500 mg total) by mouth 2 (two) times daily with a meal.    Dispense:  180 tablet    Refill:  1    Orders Placed This Encounter  Procedures  . Bayer DCA Hb A1c Waived  . CMP14+EGFR    Order Specific Question:   Has the patient fasted?  Answer:   Yes      Diagnoses and all orders for this visit:  Controlled type 2 diabetes mellitus with other circulatory complication, without long-term current use of insulin (HCC) -     Bayer DCA Hb A1c Waived -     CMP14+EGFR  Other orders -     metFORMIN (GLUCOPHAGE) 500 MG tablet; Take 1 tablet (500 mg total) by mouth 2 (two) times daily with a meal.    Virtual Visit via telephone Note  I discussed the limitations, risks, security and privacy concerns of performing an evaluation and management service by telephone and the availability of in person appointments. The  patient was identified with two identifiers. Pt.expressed understanding and agreed to proceed. Pt. Is at home. Dr. Livia Snellen is in his office.  Follow Up Instructions:   I discussed the assessment and treatment plan with the patient. The patient was provided an opportunity to ask questions and all were answered. The patient agreed with the plan and demonstrated an understanding of the instructions.   The patient was advised to call back or seek an in-person evaluation if the symptoms worsen or if the condition fails to improve as anticipated.  Visit started: 7:58 Call ended:  8:15 Total minutes including chart review and phone contact time: 25   Follow up plan: Return in about 3 months (around 12/03/2018).  Claretta Fraise, MD [place]

## 2018-09-04 LAB — CMP14+EGFR
ALT: 37 IU/L — ABNORMAL HIGH (ref 0–32)
AST: 27 IU/L (ref 0–40)
Albumin/Globulin Ratio: 1.6 (ref 1.2–2.2)
Albumin: 4.4 g/dL (ref 3.8–4.9)
Alkaline Phosphatase: 96 IU/L (ref 39–117)
BUN/Creatinine Ratio: 14 (ref 9–23)
BUN: 13 mg/dL (ref 6–24)
Bilirubin Total: 0.4 mg/dL (ref 0.0–1.2)
CO2: 23 mmol/L (ref 20–29)
Calcium: 10 mg/dL (ref 8.7–10.2)
Chloride: 98 mmol/L (ref 96–106)
Creatinine, Ser: 0.95 mg/dL (ref 0.57–1.00)
GFR calc Af Amer: 77 mL/min/{1.73_m2} (ref >59)
GFR calc non Af Amer: 67 mL/min/{1.73_m2} (ref >59)
Globulin, Total: 2.8 g/dL (ref 1.5–4.5)
Glucose: 131 mg/dL — ABNORMAL HIGH (ref 65–99)
Potassium: 4.9 mmol/L (ref 3.5–5.2)
Sodium: 140 mmol/L (ref 134–144)
Total Protein: 7.2 g/dL (ref 6.0–8.5)

## 2018-09-04 NOTE — Progress Notes (Signed)
Hello Marilyn Ford,  Your lab result is normal.Some minor variations that are not significant are commonly marked abnormal, but do not represent any medical problem for you.  Best regards, Lorieann Argueta, M.D.

## 2018-10-30 ENCOUNTER — Other Ambulatory Visit: Payer: Self-pay | Admitting: Family Medicine

## 2018-11-04 DIAGNOSIS — Z1231 Encounter for screening mammogram for malignant neoplasm of breast: Secondary | ICD-10-CM | POA: Diagnosis not present

## 2018-11-05 ENCOUNTER — Telehealth: Payer: Self-pay | Admitting: Family Medicine

## 2018-11-13 ENCOUNTER — Encounter: Payer: Self-pay | Admitting: *Deleted

## 2018-11-27 ENCOUNTER — Telehealth: Payer: Self-pay | Admitting: Family Medicine

## 2018-11-27 NOTE — Chronic Care Management (AMB) (Signed)
°  Chronic Care Management   Outreach Note  11/27/2018 Name: Marilyn Ford MRN: [id number] DOB: [birth year]  Referred by: Claretta Fraise, MD Reason for referral : Chronic Care Management (Initial CCM outreach was unsuccessful. )   An unsuccessful telephone outreach was attempted today. The patient was referred to the case management team by for assistance with chronic care management and care coordination.   Follow Up Plan: A HIPPA compliant phone message was left for the patient providing contact information and requesting a return call.  The care management team will reach out to the patient again over the next 7 days.  If patient returns call to provider office, please advise to call [place] at [place]  [email or web address]   ??[phone]

## 2018-12-03 NOTE — Chronic Care Management (AMB) (Signed)
°  Chronic Care Management   Outreach Note  12/03/2018 Name: Marilyn Ford MRN: [id number] DOB: 03/27/1961  Referred by: Claretta Fraise, MD Reason for referral : Chronic Care Management (Initial CCM outreach was unsuccessful. ) and Chronic Care Management (Second CCM outreach was unsuccessful.)   A second unsuccessful telephone outreach was attempted today. The patient was referred to the case management team for assistance with chronic care management and care coordination.   Follow Up Plan: A HIPPA compliant phone message was left for the patient providing contact information and requesting a return call.  The care management team will reach out to the patient again over the next 7 days.  If patient returns call to provider office, please advise to call [place] at [place]  [email or web address]   ??[phone]

## 2018-12-08 ENCOUNTER — Encounter: Payer: Self-pay | Admitting: Family Medicine

## 2018-12-08 ENCOUNTER — Ambulatory Visit (INDEPENDENT_AMBULATORY_CARE_PROVIDER_SITE_OTHER): Payer: Medicare HMO | Admitting: Family Medicine

## 2018-12-08 ENCOUNTER — Other Ambulatory Visit: Payer: Self-pay | Admitting: Family Medicine

## 2018-12-08 ENCOUNTER — Other Ambulatory Visit: Payer: Self-pay

## 2018-12-08 VITALS — BP 147/79 | HR 73 | Temp 98.7°F | Ht 62.0 in | Wt 177.0 lb

## 2018-12-08 DIAGNOSIS — F411 Generalized anxiety disorder: Secondary | ICD-10-CM

## 2018-12-08 DIAGNOSIS — E782 Mixed hyperlipidemia: Secondary | ICD-10-CM

## 2018-12-08 DIAGNOSIS — I10 Essential (primary) hypertension: Secondary | ICD-10-CM | POA: Diagnosis not present

## 2018-12-08 DIAGNOSIS — E1165 Type 2 diabetes mellitus with hyperglycemia: Secondary | ICD-10-CM

## 2018-12-08 DIAGNOSIS — E1159 Type 2 diabetes mellitus with other circulatory complications: Secondary | ICD-10-CM | POA: Diagnosis not present

## 2018-12-08 LAB — BAYER DCA HB A1C WAIVED: HB A1C (BAYER DCA - WAIVED): 8.3 % — ABNORMAL HIGH (ref <7.0)

## 2018-12-08 MED ORDER — METFORMIN HCL 500 MG PO TABS: 500.0000 mg | ORAL_TABLET | Freq: Two times a day (BID) | ORAL | 1 refills | Status: DC

## 2018-12-08 MED ORDER — ASPIRIN 81 MG PO TBEC: 81.0000 mg | DELAYED_RELEASE_TABLET | Freq: Every day | ORAL | 0 refills | Status: AC

## 2018-12-08 MED ORDER — CELECOXIB 200 MG PO CAPS: 200.0000 mg | ORAL_CAPSULE | Freq: Every day | ORAL | 1 refills | Status: DC

## 2018-12-08 MED ORDER — METOPROLOL SUCCINATE ER 200 MG PO TB24: 200.0000 mg | ORAL_TABLET | Freq: Every day | ORAL | 2 refills | Status: DC

## 2018-12-08 MED ORDER — PANTOPRAZOLE SODIUM 40 MG PO TBEC: 40.0000 mg | DELAYED_RELEASE_TABLET | Freq: Every day | ORAL | 3 refills | Status: DC

## 2018-12-08 MED ORDER — JANUMET XR 100-1000 MG PO TB24: 1.0000 | ORAL_TABLET | Freq: Every day | ORAL | 1 refills | Status: DC

## 2018-12-08 MED ORDER — TRAZODONE HCL 150 MG PO TABS: ORAL_TABLET | ORAL | 1 refills | Status: DC

## 2018-12-08 MED ORDER — SERTRALINE HCL 50 MG PO TABS: 50.0000 mg | ORAL_TABLET | Freq: Every day | ORAL | 1 refills | Status: DC

## 2018-12-08 MED ORDER — VALSARTAN-HYDROCHLOROTHIAZIDE 320-25 MG PO TABS: 1.0000 | ORAL_TABLET | Freq: Every day | ORAL | 1 refills | Status: DC

## 2018-12-08 MED ORDER — AMLODIPINE BESYLATE 10 MG PO TABS: ORAL_TABLET | ORAL | 1 refills | Status: DC

## 2018-12-08 MED ORDER — METOPROLOL SUCCINATE ER 50 MG PO TB24: ORAL_TABLET | ORAL | 1 refills | Status: DC

## 2018-12-08 MED ORDER — ATORVASTATIN CALCIUM 80 MG PO TABS: ORAL_TABLET | ORAL | 1 refills | Status: DC

## 2018-12-08 MED ORDER — CIPROFLOXACIN HCL 500 MG PO TABS: 500.0000 mg | ORAL_TABLET | Freq: Two times a day (BID) | ORAL | 0 refills | Status: DC

## 2018-12-08 MED ORDER — CLOPIDOGREL BISULFATE 75 MG PO TABS: 75.0000 mg | ORAL_TABLET | Freq: Every day | ORAL | 1 refills | Status: DC

## 2018-12-08 NOTE — Progress Notes (Signed)
Subjective:  Patient ID: Marilyn Ford, female    DOB: October 25, 1960  Age: 58 y.o. MRN: [id number]  CC: Diabetes (3 month follow up)   HPI Marilyn Ford presents forFollow-up of diabetes. Patient checks blood sugar at home.   168 range fasting and not checking postprandial Patient denies symptoms such as polyuria, polydipsia, excessive hunger, nausea No significant hypoglycemic spells noted. Medications reviewed. Pt reports taking them regularly without complication/adverse reaction being reported today.  Checking feet daily. Last eye appt was over 1 year. Depression screen [facility] 2/9 06/02/2018 12/27/2017 08/16/2017  Decreased Interest 1 1 0  Down, Depressed, Hopeless 0 1 1  PHQ - 2 Score '1 2 1  ' Altered sleeping - 2 -  Tired, decreased energy - 1 -  Change in appetite - 2 -  Feeling bad or failure about yourself  - 2 -  Trouble concentrating - 1 -  Moving slowly or fidgety/restless - 2 -  Suicidal thoughts - 0 -  PHQ-9 Score - 12 -  Difficult doing work/chores - - -     GAD 7 : Generalized Anxiety Score 12/08/2018 09/22/2015 03/15/2015  Nervous, Anxious, on Edge '3 2 2  ' Control/stop worrying '3 3 2  ' Worry too much - different things '3 3 1  ' Trouble relaxing 3 2 0  Restless '3 2 2  ' Easily annoyed or irritable '3 3 3  ' Afraid - awful might happen 2 0 0  Total GAD 7 Score '20 15 10  ' Anxiety Difficulty Very difficult Somewhat difficult -      Depression screen [facility] 2/9 06/02/2018 12/27/2017 08/16/2017 07/04/2017 05/17/2017  Decreased Interest 1 1 0 0 0  Down, Depressed, Hopeless 0 1 1 0 0  PHQ - 2 Score '1 2 1 ' 0 0  Altered sleeping - 2 - 0 -  Tired, decreased energy - 1 - 0 -  Change in appetite - 2 - 1 -  Feeling bad or failure about yourself  - 2 - 0 -  Trouble concentrating - 1 - 0 -  Moving slowly or fidgety/restless - 2 - 0 -  Suicidal thoughts - 0 - 0 -  PHQ-9 Score - 12 - 1 -  Difficult doing work/chores - - - Not difficult at all -     History Barbarita has a past medical  history of Diabetes mellitus without complication ([place]), Hypertension, Stroke ([place]), and Vaginal Pap smear, abnormal.   Marilyn Ford has a past surgical history that includes Cholecystectomy and Cesarean section.   Her family history includes Diabetes in her brother and mother.Marilyn Ford reports that Marilyn Ford quit smoking about 3 years ago. Her smoking use included cigarettes. Marilyn Ford started smoking about 19 years ago. Marilyn Ford smoked 0.50 packs per day. Marilyn Ford has never used smokeless tobacco. Marilyn Ford reports that Marilyn Ford does not drink alcohol or use drugs.  Current Outpatient Medications on File Prior to Visit  Medication Sig Dispense Refill  . Multiple Vitamin (MULTIVITAMIN) capsule Take 1 capsule by mouth daily.     No current facility-administered medications on file prior to visit.     ROS Review of Systems  Constitutional: Negative.   HENT: Negative for congestion.   Eyes: Negative for visual disturbance.  Respiratory: Negative for shortness of breath.   Cardiovascular: Negative for chest pain.  Gastrointestinal: Negative for abdominal pain, constipation, diarrhea, nausea and vomiting.  Genitourinary: Negative for difficulty urinating.  Musculoskeletal: Negative for arthralgias and myalgias.  Neurological: Negative for headaches.  Psychiatric/Behavioral: Positive for dysphoric mood. Negative for  sleep disturbance. The patient is nervous/anxious.     Objective:  BP (!) 147/79   Pulse 73   Temp 98.7 F (37.1 C) (Temporal)   Ht '5\' 2"'  (1.575 m)   Wt 177 lb (80.3 kg)   BMI 32.37 kg/m   BP Readings from Last 3 Encounters:  12/08/18 (!) 147/79  06/02/18 (!) 158/87  02/04/18 (!) 148/92    Wt Readings from Last 3 Encounters:  12/08/18 177 lb (80.3 kg)  06/02/18 174 lb 2 oz (79 kg)  02/04/18 168 lb 14.4 oz (76.6 kg)     Physical Exam Constitutional:      General: Marilyn Ford is not in acute distress.    Appearance: Marilyn Ford is well-developed.  HENT:     Head: Normocephalic and atraumatic.     Right Ear: External  ear normal.     Left Ear: External ear normal.     Nose: Nose normal.  Eyes:     Conjunctiva/sclera: Conjunctivae normal.     Pupils: Pupils are equal, round, and reactive to light.  Neck:     Musculoskeletal: Normal range of motion and neck supple.     Thyroid: No thyromegaly.  Cardiovascular:     Rate and Rhythm: Normal rate and regular rhythm.     Heart sounds: Normal heart sounds. No murmur.  Pulmonary:     Effort: Pulmonary effort is normal. No respiratory distress.     Breath sounds: Normal breath sounds. No wheezing or rales.  Abdominal:     General: Bowel sounds are normal. There is no distension.     Palpations: Abdomen is soft.     Tenderness: There is no abdominal tenderness.  Lymphadenopathy:     Cervical: No cervical adenopathy.  Skin:    General: Skin is warm and dry.  Neurological:     Mental Status: Marilyn Ford is alert and oriented to person, place, and time.     Deep Tendon Reflexes: Reflexes are normal and symmetric.  Psychiatric:        Mood and Affect: Mood is anxious.        Behavior: Behavior normal.        Thought Content: Thought content normal.        Cognition and Memory: Cognition normal.        Judgment: Judgment normal.    Results for orders placed or performed in visit on 12/08/18  Bayer DCA Hb A1c Waived  Result Value Ref Range   HB A1C (BAYER DCA - WAIVED) 8.3 (H) <7.0 %      Assessment & Plan:   [place] was seen today for diabetes.  Diagnoses and all orders for this visit:  Uncontrolled type 2 diabetes mellitus with hyperglycemia ([place]) -     Microalbumin / creatinine urine ratio -     Bayer DCA Hb A1c Waived  Mixed hyperlipidemia -     Lipid panel  Essential hypertension -     CBC with Differential/Platelet -     CMP14+EGFR  GAD (generalized anxiety disorder)  Other orders -     amLODipine (NORVASC) 10 MG tablet; 1 tablet daily -     aspirin 81 MG EC tablet; Take 1 tablet (81 mg total) by mouth daily. -     atorvastatin (LIPITOR)  80 MG tablet; TAKE 1 TABLET DAILY AT 6 P.M. -     celecoxib (CELEBREX) 200 MG capsule; Take 1 capsule (200 mg total) by mouth daily. With food -     clopidogrel (PLAVIX) 75  MG tablet; Take 1 tablet (75 mg total) by mouth daily. -     Discontinue: metFORMIN (GLUCOPHAGE) 500 MG tablet; Take 1 tablet (500 mg total) by mouth 2 (two) times daily with a meal. -     Discontinue: metoprolol succinate (TOPROL-XL) 50 MG 24 hr tablet; TAKE ONE TABLET BY MOUTH ONCE DAILY WITH OR IMMEDIATELY FOLLOWING A MEAL -     pantoprazole (PROTONIX) 40 MG tablet; Take 1 tablet (40 mg total) by mouth daily. For stomach -     traZODone (DESYREL) 150 MG tablet; TAKE 1 OR 2 TABLETS AT BEDTIME FOR SLEEP -     valsartan-hydrochlorothiazide (DIOVAN-HCT) 320-25 MG tablet; Take 1 tablet by mouth daily. -     metoprolol succinate (TOPROL-XL) 200 MG 24 hr tablet; Take 1 tablet (200 mg total) by mouth daily. TAKE ONE TABLET BY MOUTH ONCE DAILY TAKE WITH OR IMMEDIATELY FOLLOWING A MEAL -     ciprofloxacin (CIPRO) 500 MG tablet; Take 1 tablet (500 mg total) by mouth 2 (two) times daily. For skin infection -     sertraline (ZOLOFT) 50 MG tablet; Take 1 tablet (50 mg total) by mouth at bedtime. For anxiety and depression -     SitaGLIPtin-MetFORMIN HCl (JANUMET XR) [phone] MG TB24; Take 1 tablet by mouth daily. For diabetes      I have discontinued Elissia T. Ghattas's metFORMIN, metoprolol succinate, metFORMIN, and metoprolol succinate. I have changed her metoprolol succinate to metoprolol. I have also changed her amLODipine, aspirin, and valsartan-hydrochlorothiazide. Additionally, I am having her start on ciprofloxacin, sertraline, and Janumet XR. Lastly, I am having her maintain her multivitamin, atorvastatin, celecoxib, clopidogrel, pantoprazole, and traZODone.  Meds ordered this encounter  Medications  . amLODipine (NORVASC) 10 MG tablet    Sig: 1 tablet daily    Dispense:  90 tablet    Refill:  1  . aspirin 81 MG EC tablet     Sig: Take 1 tablet (81 mg total) by mouth daily.    Dispense:  30 tablet    Refill:  0  . atorvastatin (LIPITOR) 80 MG tablet    Sig: TAKE 1 TABLET DAILY AT 6 P.M.    Dispense:  90 tablet    Refill:  1  . celecoxib (CELEBREX) 200 MG capsule    Sig: Take 1 capsule (200 mg total) by mouth daily. With food    Dispense:  90 capsule    Refill:  1  . clopidogrel (PLAVIX) 75 MG tablet    Sig: Take 1 tablet (75 mg total) by mouth daily.    Dispense:  90 tablet    Refill:  1  . DISCONTD: metFORMIN (GLUCOPHAGE) 500 MG tablet    Sig: Take 1 tablet (500 mg total) by mouth 2 (two) times daily with a meal.    Dispense:  180 tablet    Refill:  1  . DISCONTD: metoprolol succinate (TOPROL-XL) 50 MG 24 hr tablet    Sig: TAKE ONE TABLET BY MOUTH ONCE DAILY WITH OR IMMEDIATELY FOLLOWING A MEAL    Dispense:  90 tablet    Refill:  1  . pantoprazole (PROTONIX) 40 MG tablet    Sig: Take 1 tablet (40 mg total) by mouth daily. For stomach    Dispense:  90 tablet    Refill:  3  . traZODone (DESYREL) 150 MG tablet    Sig: TAKE 1 OR 2 TABLETS AT BEDTIME FOR SLEEP    Dispense:  180 tablet    Refill:  1  . valsartan-hydrochlorothiazide (DIOVAN-HCT) 320-25 MG tablet    Sig: Take 1 tablet by mouth daily.    Dispense:  90 tablet    Refill:  1  . metoprolol succinate (TOPROL-XL) 200 MG 24 hr tablet    Sig: Take 1 tablet (200 mg total) by mouth daily. TAKE ONE TABLET BY MOUTH ONCE DAILY TAKE WITH OR IMMEDIATELY FOLLOWING A MEAL    Dispense:  30 tablet    Refill:  2  . ciprofloxacin (CIPRO) 500 MG tablet    Sig: Take 1 tablet (500 mg total) by mouth 2 (two) times daily. For skin infection    Dispense:  14 tablet    Refill:  0  . sertraline (ZOLOFT) 50 MG tablet    Sig: Take 1 tablet (50 mg total) by mouth at bedtime. For anxiety and depression    Dispense:  90 tablet    Refill:  1  . SitaGLIPtin-MetFORMIN HCl (JANUMET XR) [phone] MG TB24    Sig: Take 1 tablet by mouth daily. For diabetes    Dispense:   90 tablet    Refill:  1     Follow-up: Return in about 3 months (around 03/10/2019).  Claretta Fraise, M.D.

## 2018-12-08 NOTE — Patient Instructions (Signed)

## 2018-12-09 LAB — CMP14+EGFR
ALT: 51 IU/L — ABNORMAL HIGH (ref 0–32)
AST: 39 IU/L (ref 0–40)
Albumin/Globulin Ratio: 1.9 (ref 1.2–2.2)
Albumin: 5.1 g/dL — ABNORMAL HIGH (ref 3.8–4.9)
Alkaline Phosphatase: 112 IU/L (ref 39–117)
BUN/Creatinine Ratio: 14 (ref 9–23)
BUN: 15 mg/dL (ref 6–24)
Bilirubin Total: 0.5 mg/dL (ref 0.0–1.2)
CO2: 25 mmol/L (ref 20–29)
Calcium: 11.7 mg/dL — ABNORMAL HIGH (ref 8.7–10.2)
Chloride: 93 mmol/L — ABNORMAL LOW (ref 96–106)
Creatinine, Ser: 1.09 mg/dL — ABNORMAL HIGH (ref 0.57–1.00)
GFR calc Af Amer: 65 mL/min/{1.73_m2} (ref >59)
GFR calc non Af Amer: 56 mL/min/{1.73_m2} — ABNORMAL LOW (ref >59)
Globulin, Total: 2.7 g/dL (ref 1.5–4.5)
Glucose: 232 mg/dL — ABNORMAL HIGH (ref 65–99)
Potassium: 5.4 mmol/L — ABNORMAL HIGH (ref 3.5–5.2)
Sodium: 137 mmol/L (ref 134–144)
Total Protein: 7.8 g/dL (ref 6.0–8.5)

## 2018-12-09 LAB — CBC WITH DIFFERENTIAL/PLATELET
Basophils Absolute: 0.1 10*3/uL (ref 0.0–0.2)
Basos: 1 %
EOS (ABSOLUTE): 0.2 10*3/uL (ref 0.0–0.4)
Eos: 2 %
Hematocrit: 42 % (ref 34.0–46.6)
Hemoglobin: 14.5 g/dL (ref 11.1–15.9)
Immature Grans (Abs): 0 10*3/uL (ref 0.0–0.1)
Immature Granulocytes: 0 %
Lymphocytes Absolute: 1.7 10*3/uL (ref 0.7–3.1)
Lymphs: 20 %
MCH: 31.7 pg (ref 26.6–33.0)
MCHC: 34.5 g/dL (ref 31.5–35.7)
MCV: 92 fL (ref 79–97)
Monocytes Absolute: 0.5 10*3/uL (ref 0.1–0.9)
Monocytes: 6 %
Neutrophils Absolute: 6 10*3/uL (ref 1.4–7.0)
Neutrophils: 71 %
Platelets: 253 10*3/uL (ref 150–450)
RBC: 4.58 x10E6/uL (ref 3.77–5.28)
RDW: 12.7 % (ref 11.7–15.4)
WBC: 8.5 10*3/uL (ref 3.4–10.8)

## 2018-12-09 LAB — LIPID PANEL
Chol/HDL Ratio: 4.2 ratio (ref 0.0–4.4)
Cholesterol, Total: 178 mg/dL (ref 100–199)
HDL: 42 mg/dL (ref >39)
Triglycerides: 401 mg/dL — ABNORMAL HIGH (ref 0–149)

## 2018-12-09 LAB — MICROALBUMIN / CREATININE URINE RATIO
Creatinine, Urine: 87.6 mg/dL
Microalb/Creat Ratio: 32 mg/g creat — ABNORMAL HIGH (ref 0–29)
Microalbumin, Urine: 27.8 ug/mL

## 2018-12-12 ENCOUNTER — Other Ambulatory Visit: Payer: Self-pay | Admitting: *Deleted

## 2018-12-12 ENCOUNTER — Ambulatory Visit: Payer: Medicare HMO | Admitting: Family Medicine

## 2018-12-12 MED ORDER — METOPROLOL SUCCINATE ER 200 MG PO TB24: 200.0000 mg | ORAL_TABLET | Freq: Every day | ORAL | 1 refills | Status: DC

## 2018-12-17 ENCOUNTER — Telehealth: Payer: Self-pay | Admitting: Family Medicine

## 2018-12-17 NOTE — Telephone Encounter (Signed)
Pt aware metoprolol 200 xl  Was sent to mail on 8/7. Confirmation from pharm as well.

## 2018-12-18 ENCOUNTER — Ambulatory Visit: Payer: Medicare HMO

## 2018-12-22 NOTE — Chronic Care Management (AMB) (Signed)
Chronic Care Management   Note  12/22/2018 Name: Marilyn Ford MRN: [id number] DOB: Feb 25, 1961  [name] is a 58 y.o. year old female who is a primary care patient of Stacks, Cletus Gash, MD. I reached out to [name] by phone today in response to a referral sent by Ms. Lorie Phenix Kuster's health plan.    Ms. Yim was given information about Chronic Care Management services today including:  1. CCM service includes personalized support from designated clinical staff supervised by her physician, including individualized plan of care and coordination with other care providers 2. 24/7 contact phone numbers for assistance for urgent and routine care needs. 3. Service will only be billed when office clinical staff spend 20 minutes or more in a month to coordinate care. 4. Only one practitioner may furnish and bill the service in a calendar month. 5. The patient may stop CCM services at any time (effective at the end of the month) by phone call to the office staff. 6. The patient will be responsible for cost sharing (co-pay) of up to 20% of the service fee (after annual deductible is met).  Patient agreed to services and verbal consent obtained.   Follow up plan: Telephone appointment with CCM team member scheduled for: 01/21/2019  [place]  ??[email or web address]   ??[phone]

## 2018-12-23 ENCOUNTER — Telehealth: Payer: Self-pay | Admitting: *Deleted

## 2018-12-23 MED ORDER — METOPROLOL SUCCINATE ER 200 MG PO TB24: 200.0000 mg | ORAL_TABLET | Freq: Every day | ORAL | 1 refills | Status: DC

## 2018-12-23 MED ORDER — PANTOPRAZOLE SODIUM 40 MG PO TBEC: 40.0000 mg | DELAYED_RELEASE_TABLET | Freq: Every day | ORAL | 3 refills | Status: DC

## 2018-12-23 MED ORDER — JANUMET XR 100-1000 MG PO TB24: 1.0000 | ORAL_TABLET | Freq: Every day | ORAL | 1 refills | Status: DC

## 2018-12-23 NOTE — Telephone Encounter (Signed)
Pt aware sent to [facility]

## 2019-01-02 ENCOUNTER — Ambulatory Visit (INDEPENDENT_AMBULATORY_CARE_PROVIDER_SITE_OTHER): Payer: Medicare HMO | Admitting: *Deleted

## 2019-01-02 ENCOUNTER — Other Ambulatory Visit: Payer: Self-pay

## 2019-01-02 DIAGNOSIS — Z0000 Encounter for general adult medical examination without abnormal findings: Secondary | ICD-10-CM | POA: Diagnosis not present

## 2019-01-02 NOTE — Progress Notes (Addendum)
MEDICARE ANNUAL WELLNESS VISIT  01/02/2019  Telephone Visit Disclaimer This Medicare AWV was conducted by telephone due to national recommendations for restrictions regarding the COVID-19 Pandemic (e.g. social distancing).  I verified, using two identifiers, that I am speaking with Marilyn Ford or their authorized healthcare agent. I discussed the limitations, risks, security, and privacy concerns of performing an evaluation and management service by telephone and the potential availability of an in-person appointment in the future. The patient expressed understanding and agreed to proceed.   Subjective:  Marilyn Ford is a 58 y.o. female patient of Stacks, Cletus Gash, MD who had a Medicare Annual Wellness Visit today via telephone. Khayla is Disabled and lives with their spouse. she has 2 children. she reports that she is socially active and does interact with friends/family regularly. she is minimally physically active and enjoys volunteering at the thrift shop.  Patient Care Team: Claretta Fraise, MD as PCP - General (Family Medicine) Claretta Fraise, MD (Family Medicine) Ilean China, RN as Registered Nurse  Advanced Directives 01/02/2019 03/12/2015  Does Patient Have a Medical Advance Directive? No No  Would patient like information on creating a medical advance directive? No - Patient declined No - patient declined information    Hospital Utilization Over the Past 12 Months: # of hospitalizations or ER visits: 0 # of surgeries: 0  Review of Systems    Patient reports that her overall health is unchanged compared to last year.  History obtained from chart review  Patient Reported Readings (BP, Pulse, CBG, Weight, etc) none  Pain Assessment Pain : No/denies pain     Current Medications & Allergies (verified) Allergies as of 01/02/2019      Reactions   Penicillins Shortness Of Breath      Medication List       Accurate as of January 02, 2019  9:09 AM. If you  have any questions, ask your nurse or doctor.        STOP taking these medications   ciprofloxacin 500 MG tablet Commonly known as: Cipro     TAKE these medications   amLODipine 10 MG tablet Commonly known as: NORVASC 1 tablet daily   aspirin 81 MG EC tablet Take 1 tablet (81 mg total) by mouth daily.   atorvastatin 80 MG tablet Commonly known as: LIPITOR TAKE 1 TABLET DAILY AT 6 P.M.   celecoxib 200 MG capsule Commonly known as: CELEBREX Take 1 capsule (200 mg total) by mouth daily. With food   clopidogrel 75 MG tablet Commonly known as: PLAVIX Take 1 tablet (75 mg total) by mouth daily.   Janumet XR [phone] MG Tb24 Generic drug: SitaGLIPtin-MetFORMIN HCl Take 1 tablet by mouth daily. For diabetes   metoprolol 200 MG 24 hr tablet Commonly known as: TOPROL-XL Take 1 tablet (200 mg total) by mouth daily. TAKE ONE TABLET BY MOUTH ONCE DAILY TAKE WITH OR IMMEDIATELY FOLLOWING A MEAL   multivitamin capsule Take 1 capsule by mouth daily.   pantoprazole 40 MG tablet Commonly known as: PROTONIX Take 1 tablet (40 mg total) by mouth daily. For stomach   sertraline 50 MG tablet Commonly known as: ZOLOFT Take 1 tablet (50 mg total) by mouth at bedtime. For anxiety and depression   traZODone 150 MG tablet Commonly known as: DESYREL TAKE 1 OR 2 TABLETS AT BEDTIME FOR SLEEP   valsartan-hydrochlorothiazide 320-25 MG tablet Commonly known as: DIOVAN-HCT Take 1 tablet by mouth daily.       History (reviewed):  Past Medical History:  Diagnosis Date  . Diabetes mellitus without complication ([place])   . Hypertension   . Stroke ([place])   . Vaginal Pap smear, abnormal    Past Surgical History:  Procedure Laterality Date  . CESAREAN SECTION    . CHOLECYSTECTOMY     Family History  Problem Relation Age of Onset  . Diabetes Brother   . Diabetes Mother    Social History   Socioeconomic History  . Marital status: Married    Spouse name: Marilyn Ford  . Number of children:  2  . Years of education: 10  . Highest education level: 10th grade  Occupational History  . Occupation: disabled  Social Needs  . Financial resource strain: Somewhat hard  . Food insecurity    Worry: Never true    Inability: Never true  . Transportation needs    Medical: No    Non-medical: No  Tobacco Use  . Smoking status: Former Smoker    Packs/day: 0.50    Types: Cigarettes    Start date: 12/08/1999    Quit date: 03/12/2015    Years since quitting: 3.8  . Smokeless tobacco: Never Used  Substance and Sexual Activity  . Alcohol use: Yes    Alcohol/week: 1.0 standard drinks    Types: 1 Glasses of wine per week  . Drug use: No  . Sexual activity: Not Currently    Birth control/protection: Post-menopausal  Lifestyle  . Physical activity    Days per week: 7 days    Minutes per session: 10 min  . Stress: Very much  Relationships  . Social Herbalist on phone: Three times a week    Gets together: Once a week    Attends religious service: Never    Active member of club or organization: No    Attends meetings of clubs or organizations: Never    Relationship status: Married  Other Topics Concern  . Not on file  Social History Narrative  . Not on file    Activities of Daily Living In your present state of health, do you have any difficulty performing the following activities: 01/02/2019  Hearing? N  Vision? N  Comment had her last diabetic eye exam about 8 months ago  Difficulty concentrating or making decisions? Y  Comment she can't remember names of places or people and where she puts things  Walking or climbing stairs? N  Dressing or bathing? N  Doing errands, shopping? N  Preparing Food and eating ? Y  Comment she only cooks when her husband is home, because she forgets that she is cooking and her husband is concerned  Using the Toilet? N  In the past six months, have you accidently leaked urine? N  Do you have problems with loss of bowel control? N   Managing your Medications? Y  Comment her husband always sets them out for her so she doesn't forget to take them  Managing your Finances? N  Housekeeping or managing your Housekeeping? N  Some recent data might be hidden    Patient Education/ Literacy How often do you need to have someone help you when you read instructions, pamphlets, or other written materials from your doctor or pharmacy?: 3 - Sometimes(she can read  but doesn't always comprehend) What is the last grade level you completed in school?: 10th grade  Exercise Current Exercise Habits: Home exercise routine, Type of exercise: walking, Time (Minutes): 10, Frequency (Times/Week): 7, Weekly Exercise (Minutes/Week): 70, Exercise limited  by: Other - see comments(legs go numb sometimes)  Diet Patient reports consuming 2 meals a day and 2 snack(s) a day Patient reports that her primary diet is: Regular Patient reports that she does have regular access to food.   Depression Screen PHQ 2/9 Scores 01/02/2019 06/02/2018 12/27/2017 08/16/2017 07/04/2017 05/17/2017 01/28/2017  PHQ - 2 Score 2 1 2 1  0 0 0  PHQ- 9 Score 7 - 12 - 1 - -     Fall Risk Fall Risk  01/02/2019 12/27/2017 08/16/2017 07/24/2016 04/11/2016  Falls in the past year? 0 No No No No  Number falls in past yr: 0 - - - -  Injury with Fall? 0 - - - -  Follow up Falls prevention discussed - - - -  Comment get rid of all throw rugs, adequate lighting in general walkways and grab bars in the bathroom - - - -     Objective:  Marilyn Ford seemed alert and oriented and she participated appropriately during our telephone visit.  Blood Pressure Weight BMI  BP Readings from Last 3 Encounters:  12/08/18 (!) 147/79  06/02/18 (!) 158/87  02/04/18 (!) 148/92   Wt Readings from Last 3 Encounters:  12/08/18 177 lb (80.3 kg)  06/02/18 174 lb 2 oz (79 kg)  02/04/18 168 lb 14.4 oz (76.6 kg)   BMI Readings from Last 1 Encounters:  12/08/18 32.37 kg/m    *Unable to obtain  current vital signs, weight, and BMI due to telephone visit type  Hearing/Vision  . Ger did not seem to have difficulty with hearing/understanding during the telephone conversation . Reports that she has had a formal eye exam by an eye care professional within the past year . Reports that she has not had a formal hearing evaluation within the past year *Unable to fully assess hearing and vision during telephone visit type  Cognitive Function: 6CIT Screen 01/02/2019  What Year? 0 points  What month? 0 points  What time? 0 points  Count back from 20 0 points  Months in reverse 2 points  Repeat phrase 0 points  Total Score 2   (Normal:0-7, Significant for Dysfunction: >8)  Normal Cognitive Function Screening: Yes   Immunization & Health Maintenance Record Immunization History  Administered Date(s) Administered  . Influenza,inj,Quad PF,6+ Mos 03/22/2015, 01/30/2017, 02/04/2018  . Influenza-Unspecified 01/28/2016    Health Maintenance  Topic Date Due  . Hepatitis C Screening  1960/06/28  . PNEUMOCOCCAL POLYSACCHARIDE VACCINE AGE 18-64 HIGH RISK  12/30/1962  . HIV Screening  12/30/1975  . COLONOSCOPY  12/30/2010  . OPHTHALMOLOGY EXAM  11/27/2017  . FOOT EXAM  01/28/2018  . COLON CANCER SCREENING ANNUAL FOBT  07/04/2018  . INFLUENZA VACCINE  12/06/2018  . HEMOGLOBIN A1C  06/10/2019  . PAP SMEAR-Modifier  07/04/2020  . MAMMOGRAM  11/03/2020  . TETANUS/TDAP  11/25/2024       Assessment  This is a routine wellness examination for JULE SALATINO.  Health Maintenance: Due or Overdue Health Maintenance Due  Topic Date Due  . Hepatitis C Screening  1960-09-28  . PNEUMOCOCCAL POLYSACCHARIDE VACCINE AGE 18-64 HIGH RISK  12/30/1962  . HIV Screening  12/30/1975  . COLONOSCOPY  12/30/2010  . OPHTHALMOLOGY EXAM  11/27/2017  . FOOT EXAM  01/28/2018  . COLON CANCER SCREENING ANNUAL FOBT  07/04/2018  . INFLUENZA VACCINE  12/06/2018    Marilyn Ford does not need a referral  for Community Assistance: Care Management:   no Social Work:  no Prescription Assistance:  no Nutrition/Diabetes Education:  no   Plan:  Personalized Goals Goals Addressed            This Visit's Progress   . DIET - INCREASE WATER INTAKE       Try to drink 6-8 glasses of water daily.      Personalized Health Maintenance & Screening Recommendations  Influenza vaccine Colorectal cancer screening Shingles vaccine  Lung Cancer Screening Recommended: no (Low Dose CT Chest recommended if Age 32-80 years, 30 pack-year currently smoking OR have quit w/in past 15 years) Hepatitis C Screening recommended: no HIV Screening recommended: no  Advanced Directives: Written information was not prepared per patient's request.  Referrals & Orders No orders of the defined types were placed in this encounter.   Follow-up Plan . Follow-up with Claretta Fraise, MD as planned . Schedule your Colonoscopy as discussed . Consider your Flu and Shingles vaccines at your next visit with your PCP   I have personally reviewed and noted the following in the patient's chart:   . Medical and social history . Use of alcohol, tobacco or illicit drugs  . Current medications and supplements . Functional ability and status . Nutritional status . Physical activity . Advanced directives . List of other physicians . Hospitalizations, surgeries, and ER visits in previous 12 months . Vitals . Screenings to include cognitive, depression, and falls . Referrals and appointments  In addition, I have reviewed and discussed with Marilyn Ford certain preventive protocols, quality metrics, and best practice recommendations. A written personalized care plan for preventive services as well as general preventive health recommendations is available and can be mailed to the patient at her request.      Marylin Crosby, LPN  [phone]    I have reviewed and agree with the above AWV documentation.    Mary-Margaret Hassell Done, FNP

## 2019-01-02 NOTE — Patient Instructions (Signed)

## 2019-01-21 ENCOUNTER — Ambulatory Visit: Payer: Medicare HMO | Admitting: *Deleted

## 2019-01-21 DIAGNOSIS — I10 Essential (primary) hypertension: Secondary | ICD-10-CM

## 2019-01-21 DIAGNOSIS — E118 Type 2 diabetes mellitus with unspecified complications: Secondary | ICD-10-CM

## 2019-01-21 DIAGNOSIS — I69351 Hemiplegia and hemiparesis following cerebral infarction affecting right dominant side: Secondary | ICD-10-CM

## 2019-01-21 DIAGNOSIS — Z8673 Personal history of transient ischemic attack (TIA), and cerebral infarction without residual deficits: Secondary | ICD-10-CM

## 2019-01-21 NOTE — Chronic Care Management (AMB) (Signed)
  Chronic Care Management   RN CCM Initial Outreach  01/21/2019 Name: MATI GRISWELL MRN: [id number] DOB: [birth year]  Referred by: Claretta Fraise, MD Reason for referral : Chronic Care Management (RN CCM Initial Visit)   An unsuccessful telephone outreach was attempted today. The patient was referred to the case management team by for assistance with chronic care management and care coordination and was scheduled for an Initial CCM Visit with the embedded RN Care Manager today.  Chart review was performed in preparation for today's visit. 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 Her husband helps provide care. She's had a significant increase in A1C from 6.7 four months ago to 8.3. Her triglycerides also increased from 191 to 401 which also supports hat her blood sugar levels have been considerably higher than they were previously. She was switched from Metformin to Janumet XR 100-1000mg  daily. I'm not sure why she had such an increase. Her blood pressure has also been elevated at recent office visits.   I would like to discuss blood sugar and blood pressure management when I talk with her.    Follow Up Plan: A HIPPA compliant phone message was left for the patient providing contact information and requesting a return call.  The care management team will reach out to the patient again over the next 30 days.   Chong Sicilian BSN, RN-BC Embedded Chronic Care Manager [facility] Direct Dial: [phone]

## 2019-02-11 ENCOUNTER — Other Ambulatory Visit: Payer: Self-pay | Admitting: Family Medicine

## 2019-02-20 ENCOUNTER — Ambulatory Visit (INDEPENDENT_AMBULATORY_CARE_PROVIDER_SITE_OTHER): Payer: Medicare HMO | Admitting: *Deleted

## 2019-02-20 DIAGNOSIS — E118 Type 2 diabetes mellitus with unspecified complications: Secondary | ICD-10-CM | POA: Diagnosis not present

## 2019-02-20 DIAGNOSIS — I10 Essential (primary) hypertension: Secondary | ICD-10-CM | POA: Diagnosis not present

## 2019-02-20 DIAGNOSIS — I69351 Hemiplegia and hemiparesis following cerebral infarction affecting right dominant side: Secondary | ICD-10-CM

## 2019-02-20 NOTE — Patient Instructions (Addendum)
Chronic Care Management   02/23/2019   Percell Locus,  If you have Medicare and live with two or more chronic conditions like arthritis, diabetes, depression, or high blood pressure, Chronic Care Management services can help you connect the dots so you can spend more time doing what you love.   Services May Include: . At least 20 minutes a month of chronic care management services with a Nurse Care Manager and/or Licensed Clinical Social Worker . Personalized assistance from a dedicated health care professional who will work with you to create your care plan . Coordination of care between your pharmacy, specialists, testing centers, and more . Phone check-ins between visits to keep you on track . 24/7 emergency access to a health care professional . Expert assistance with setting and meeting your health goals  During your initial telephone contact by [facility] staff, you were given the following information on Medicare guidelines regarding Chronic Care Management Services.  If you have any questions, please let me know.  1. CCM service include personalized support from designated clinical staff supervised by your physician, including individualized plans of care and coordination with other care providers 2. 24/7 contact phone numbers for assistance for urgent and routine care needs [facility] [phone], 24-Hr Nurse Line [phone], [facility] [phone]) 3. Service will only be billed when office clinical staff spend 20 minutes or more in a month to coordinate care. 4. Only one practitioner may furnish and bill the service in a calendar month. 5. The patient will be responsible for cost sharing (co-pay) of up to 20% of the service fee (after annual deductible is met). Most Medicare Advantage Plans cover Chronic Care Management Services at 100%. Please ask if you have any questions about your coverage.  6. You have provided verbal consent for these services. You may stop CCM services at any time  (effective at the end of the month) by phone call to the office staff.   Thank you for talking with me about the Chronic Care Management program at [facility] (through [facility])). As your Nurse Care Manager, I look forward to working with you to meet your healthcare management goals.   Listed below is the care plan that we created today. We will work together to adjust this care plan to meet your needs as they change.    Goals Addressed    . Blood Pressure Management       Current Barriers:  . Chronic Disease Management support and education needs related to hypertension.  Nurse Case Manager Clinical Goal(s):  [name] Over the next 30 days, patient will verbalize understanding of plan for blood pressure management.  Interventions:  . Evaluation of current treatment plan related to hypertension and patient's adherence to plan as established by provider. . Reviewed medications with patient and discussed amlodipine, metoprolol, and diovan . Discussed plans with patient for ongoing care management follow up and provided patient with direct contact information for care management team . Advised patient, providing education and rationale, to monitor blood pressure daily and record, calling PCP office [phone] or RN CCM [phone] for findings outside established parameters.  . Reviewed scheduled/upcoming provider appointments including:   Patient Self Care Activities:  . Performs ADL's independently . Performs IADL's independently    . Blood Sugar Management        Current Barriers:  . Chronic Disease Management support and education needs related to diabetes management.  Nurse Case Manager Clinical Goal(s):  [name] Over the next 30 days,  patient will verbalize understanding of plan for blood sugar management  Interventions:  . Evaluation of current treatment plan related to diabetes and patient's adherence to plan as established by  provider. . Reviewed medications with patient and discussed janumet XR . Discussed plans with patient for ongoing care management follow up and provided patient with direct contact information for care management team . Reviewed scheduled/upcoming provider appointments including:  . Advised patient, providing education and rationale, to check cbg daily and PRN and record, calling [phone]  for findings outside established parameters.    Patient Self Care Activities:  . Performs ADL's independently . Performs IADL's independently .        Current Barriers:  . Chronic Disease Management support, education, and care coordination needs related to HTN and DMII  Clinical Goal(s) related to HTN and DMII:  Over the next 30 days, patient will:  . Work with the care management team to address educational, disease management, and care coordination needs  . Begin self health monitoring activities as directed today . Call provider office for new or worsened signs and symptoms . Call care management team with questions or concerns . Verbalize basic understanding of patient centered plan of care established today  Interventions related to HTN and DMII:  . Evaluation of current treatment plans and patient's adherence to plan as established by provider . Assessed patient understanding of disease states . Assessed patient's education and care coordination needs . Provided basic disease specific education to patient  . Collaborated with appropriate clinical care team members regarding patient needs  Patient Self Care Activities related to HTN and DMII:  . Patient is unable to independently self-manage chronic health conditions  Initial goal documentation         Patient will keep appointment with PCP on 03/10/2019 and will bring a record of her blood pressure and daily blood               sugar readings  RN CCM will mail patient a summary of today's visit along with CCM contact and resource  information  RN CCM will f/u with patient over the next 30 days  Patient will reach out with any CCM needs   Chong Sicilian, BSN, RN-BC [place] [facility] [place] [facility] Direct Dial: [phone]

## 2019-02-20 NOTE — Chronic Care Management (AMB) (Signed)
Chronic Care Management   Initial Visit Note  02/20/2019 Name: Marilyn Ford MRN: [id number] DOB: August 07, 1960  Referred by: Claretta Fraise, MD Reason for referral : Chronic Care Management (RN CCM Initial Outreach)   Marilyn Ford is a 58 y.o. year old female who is a primary care patient of Stacks, Cletus Gash, MD. The CCM team was consulted for assistance with chronic disease management and care coordination needs related to HTN, DMII and right sided hemiparesis as latent effect of CVA.  Review of patient status, including review of consultants reports, relevant laboratory and other test results, and collaboration with appropriate care team members and the patient's provider was performed as part of comprehensive patient evaluation and provision of chronic care management services.     Subjective: I spoke with Marilyn Ford by telephone today. Overall she feels that she is managing her chronic medical conditions well but she would like to work with the CCM team to improve on her self management and to have as a resource if needed. Marilyn Ford states that she is checking her blood sugar each morning and that it was 158 this morning. It is never over 200 or below 90. She spot checks her blood pressure but not on a regular basis. 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 She lives at home with her husband and reports that finances are tight but that she has not gone without food, shelter, or transportation in the past year. She does not have any problems with purchasing her medications and is able to take them regularly. Her blood A1C jumped 2 points the last time labs were drawn and she reports that she is watching her diet and limiting carbohydrate intake now. She is walking some daily and would like to increase her physical activity but has some difficulty due to right sided hemiparesis as a latent effect of CVA.   SDOH (Social  Determinants of Health) screening performed today: Depression   Stress. See Care Plan for related entries.    Objective:  Outpatient Encounter Medications as of 02/20/2019  Medication Sig  . amLODipine (NORVASC) 10 MG tablet 1 tablet daily  . aspirin 81 MG EC tablet Take 1 tablet (81 mg total) by mouth daily.  [name] atorvastatin (LIPITOR) 80 MG tablet TAKE 1 TABLET DAILY AT 6 P.M.  . celecoxib (CELEBREX) 200 MG capsule Take 1 capsule (200 mg total) by mouth daily. With food  . clopidogrel (PLAVIX) 75 MG tablet Take 1 tablet (75 mg total) by mouth daily.  . metoprolol (TOPROL-XL) 200 MG 24 hr tablet TAKE 1 TABLET ONE TIME DAILY, TAKE WITH OR IMMEDIATELY FOLLOWING A MEAL  . Multiple Vitamin (MULTIVITAMIN) capsule Take 1 capsule by mouth daily.  . pantoprazole (PROTONIX) 40 MG tablet Take 1 tablet (40 mg total) by mouth daily. For stomach  . sertraline (ZOLOFT) 50 MG tablet Take 1 tablet (50 mg total) by mouth at bedtime. For anxiety and depression  . SitaGLIPtin-MetFORMIN HCl (JANUMET XR) [phone] MG TB24 Take 1 tablet by mouth daily. For diabetes  . traZODone (DESYREL) 150 MG tablet TAKE 1 OR 2 TABLETS AT BEDTIME FOR SLEEP  . valsartan-hydrochlorothiazide (DIOVAN-HCT) 320-25 MG tablet Take 1 tablet by mouth daily.   No facility-administered encounter medications on file as of 02/20/2019.     BP Readings from Last 3 Encounters:  12/08/18 (!) 147/79  06/02/18 (!) 158/87  02/04/18 (!) 148/92   Lab Results  Component Value Date  HGBA1C 8.3 (H) 12/08/2018   HGBA1C 6.7 09/03/2018   HGBA1C 6.6 06/02/2018   Lab Results  Component Value Date   LDLCALC Comment 12/08/2018   CREATININE 1.09 (H) 12/08/2018     Assessment & Plan Goals Addressed            This Visit's Progress     Patient Stated   . Blood Pressure Management (pt-stated)       Current Barriers:  . Chronic Disease Management support and education needs related to hypertension.  Nurse Case Manager Clinical Goal(s):   [name] Over the next 30 days, patient will verbalize understanding of plan for blood pressure management.  Interventions:  . Evaluation of current treatment plan related to hypertension and patient's adherence to plan as established by provider. . Reviewed medications with patient and discussed amlodipine, metoprolol, and diovan . Discussed plans with patient for ongoing care management follow up and provided patient with direct contact information for care management team . Advised patient, providing education and rationale, to monitor blood pressure daily and record, calling PCP office [phone] or RN CCM [phone] for findings outside established parameters.  . Reviewed scheduled/upcoming provider appointments including:   Patient Self Care Activities:  . Performs ADL's independently . Performs IADL's independently .   Initial goal documentation     . Blood Sugar Management (pt-stated)       Current Barriers:  . Chronic Disease Management support and education needs related to diabetes management.  Nurse Case Manager Clinical Goal(s):  [name] Over the next 30 days, patient will verbalize understanding of plan for blood sugar management  Interventions:  . Evaluation of current treatment plan related to diabetes and patient's adherence to plan as established by provider. . Reviewed medications with patient and discussed janumet XR . Discussed plans with patient for ongoing care management follow up and provided patient with direct contact information for care management team . Reviewed scheduled/upcoming provider appointments including:  . Advised patient, providing education and rationale, to check cbg daily and PRN and record, calling [phone]  for findings outside established parameters.    Patient Self Care Activities:  . Performs ADL's independently . Performs IADL's independently .   Initial goal documentation        Other   . CCM Initial Goal       Current  Barriers:  . Chronic Disease Management support, education, and care coordination needs related to HTN and DMII  Clinical Goal(s) related to HTN and DMII:  Over the next 30 days, patient will:  . Work with the care management team to address educational, disease management, and care coordination needs  . Begin self health monitoring activities as directed today . Call provider office for new or worsened signs and symptoms . Call care management team with questions or concerns . Verbalize basic understanding of patient centered plan of care established today  Interventions related to HTN and DMII:  . Evaluation of current treatment plans and patient's adherence to plan as established by provider . Assessed patient understanding of disease states . Assessed patient's education and care coordination needs . Provided basic disease specific education to patient  . Collaborated with appropriate clinical care team members regarding patient needs  Patient Self Care Activities related to HTN and DMII:  . Patient is unable to independently self-manage chronic health conditions  Initial goal documentation         Patient will keep appointment with PCP on 03/10/2019 and will bring a record of her blood  pressure and daily blood sugar readings  RN CCM will mail patient a summary of today's visit along with CCM contact and resource information  RN CCM will f/u with patient over the next 30 days  Patient will reach out with any CCM needs  Chong Sicilian, BSN, RN-BC [place] [facility] [place] [facility] Direct Dial: [phone]

## 2019-02-28 ENCOUNTER — Other Ambulatory Visit: Payer: Self-pay | Admitting: Family Medicine

## 2019-03-10 ENCOUNTER — Ambulatory Visit (INDEPENDENT_AMBULATORY_CARE_PROVIDER_SITE_OTHER): Payer: Medicare HMO | Admitting: Family Medicine

## 2019-03-10 ENCOUNTER — Encounter: Payer: Self-pay | Admitting: Family Medicine

## 2019-03-10 DIAGNOSIS — E118 Type 2 diabetes mellitus with unspecified complications: Secondary | ICD-10-CM

## 2019-03-10 DIAGNOSIS — I10 Essential (primary) hypertension: Secondary | ICD-10-CM | POA: Diagnosis not present

## 2019-03-10 DIAGNOSIS — E782 Mixed hyperlipidemia: Secondary | ICD-10-CM

## 2019-03-10 LAB — BAYER DCA HB A1C WAIVED: HB A1C (BAYER DCA - WAIVED): 6.8 % (ref <7.0)

## 2019-03-10 NOTE — Progress Notes (Signed)
Subjective:    Patient ID: Marilyn Ford, female    DOB: 26-May-1960, 58 y.o.   MRN: [id number]   HPI:. Marilyn Ford is a 58 y.o. female presenting for presents forFollow-up of diabetes. Patient checks blood sugar at home. No longer eating pork skins. Has less swelling.   Not sure, but did write them down fasting and 138 postprandial. 171 fasting just now. Will drop off log and have A1c done.  Patient denies symptoms such as polyuria, polydipsia, excessive hunger, nausea No significant hypoglycemic spells noted. Medications reviewed. Pt reports taking them regularly without complication/adverse reaction being reported today.  Checking feet daily.  BP running  128/86 last night. Never high. Checking frequently.   Depression screen [facility] 2/9 01/02/2019 06/02/2018 12/27/2017 08/16/2017 07/04/2017  Decreased Interest 1 1 1  0 0  Down, Depressed, Hopeless 1 0 1 1 0  PHQ - 2 Score 2 1 2 1  0  Altered sleeping 2 - 2 - 0  Tired, decreased energy 2 - 1 - 0  Change in appetite 0 - 2 - 1  Feeling bad or failure about yourself  0 - 2 - 0  Trouble concentrating 1 - 1 - 0  Moving slowly or fidgety/restless 0 - 2 - 0  Suicidal thoughts 0 - 0 - 0  PHQ-9 Score 7 - 12 - 1  Difficult doing work/chores Somewhat difficult - - - Not difficult at all     Relevant past medical, surgical, family and social history reviewed and updated as indicated.  Interim medical history since our last visit reviewed. Allergies and medications reviewed and updated.  ROS:  Review of Systems  Constitutional: Negative.   HENT: Negative for congestion.   Eyes: Negative for visual disturbance.  Respiratory: Negative for shortness of breath.   Cardiovascular: Negative for chest pain.  Gastrointestinal: Negative for abdominal pain, constipation, diarrhea, nausea and vomiting.  Genitourinary: Negative for difficulty urinating.  Musculoskeletal: Negative for arthralgias and myalgias.  Neurological: Negative for  headaches.  Psychiatric/Behavioral: Negative for sleep disturbance.     Social History   Tobacco Use  Smoking Status Former Smoker  . Packs/day: 0.50  . Types: Cigarettes  . Start date: 12/08/1999  . Quit date: 03/12/2015  . Years since quitting: 3.9  Smokeless Tobacco Never Used       Objective:     Wt Readings from Last 3 Encounters:  12/08/18 177 lb (80.3 kg)  06/02/18 174 lb 2 oz (79 kg)  02/04/18 168 lb 14.4 oz (76.6 kg)     Exam deferred. Pt. Harboring due to COVID 19. Phone visit performed.   Assessment & Plan:   1. Controlled type 2 diabetes mellitus with complication, without long-term current use of insulin ([place])   2. Essential hypertension   3. Mixed hyperlipidemia     No orders of the defined types were placed in this encounter.   Orders Placed This Encounter  Procedures  . Bayer DCA Hb A1c Waived      Diagnoses and all orders for this visit:  Controlled type 2 diabetes mellitus with complication, without long-term current use of insulin (HCC) -     Bayer DCA Hb A1c Waived  Essential hypertension  Mixed hyperlipidemia    Virtual Visit via telephone Note   I discussed the limitations, risks, security and privacy concerns of performing an evaluation and management service by telephone and the availability of in person appointments. The patient was identified with two identifiers. Pt.expressed understanding and agreed  to proceed. Pt. Is at home. Dr. Livia Snellen is in his office.  Follow Up Instructions:   I discussed the assessment and treatment plan with the patient. The patient was provided an opportunity to ask questions and all were answered. The patient agreed with the plan and demonstrated an understanding of the instructions.   The patient was advised to call back or seek an in-person evaluation if the symptoms worsen or if the condition fails to improve as anticipated.   Total minutes including chart review and phone contact time: 20    Follow up plan: Return in about 3 months (around 06/10/2019) for diabetes.  Claretta Fraise, MD [place]

## 2019-03-19 ENCOUNTER — Ambulatory Visit: Payer: Medicare HMO | Admitting: *Deleted

## 2019-03-19 DIAGNOSIS — E118 Type 2 diabetes mellitus with unspecified complications: Secondary | ICD-10-CM

## 2019-03-19 DIAGNOSIS — I10 Essential (primary) hypertension: Secondary | ICD-10-CM

## 2019-04-28 ENCOUNTER — Other Ambulatory Visit: Payer: Self-pay | Admitting: Family Medicine

## 2019-04-28 NOTE — Telephone Encounter (Signed)
Stacks. NTBS was to be seen in Nov. mail order RF not sent

## 2019-04-29 ENCOUNTER — Other Ambulatory Visit: Payer: Self-pay | Admitting: Family Medicine

## 2019-06-10 ENCOUNTER — Other Ambulatory Visit: Payer: Self-pay | Admitting: *Deleted

## 2019-06-10 ENCOUNTER — Encounter: Payer: Self-pay | Admitting: Family Medicine

## 2019-06-10 ENCOUNTER — Ambulatory Visit: Payer: Medicare HMO | Admitting: Family Medicine

## 2019-06-25 ENCOUNTER — Ambulatory Visit: Payer: Medicare HMO | Admitting: Family Medicine

## 2019-07-02 ENCOUNTER — Ambulatory Visit (INDEPENDENT_AMBULATORY_CARE_PROVIDER_SITE_OTHER): Payer: Medicare HMO | Admitting: Family Medicine

## 2019-07-02 ENCOUNTER — Other Ambulatory Visit: Payer: Self-pay

## 2019-07-02 ENCOUNTER — Encounter: Payer: Self-pay | Admitting: Family Medicine

## 2019-07-02 DIAGNOSIS — G4701 Insomnia due to medical condition: Secondary | ICD-10-CM

## 2019-07-02 DIAGNOSIS — R69 Illness, unspecified: Secondary | ICD-10-CM | POA: Diagnosis not present

## 2019-07-02 DIAGNOSIS — I69351 Hemiplegia and hemiparesis following cerebral infarction affecting right dominant side: Secondary | ICD-10-CM | POA: Diagnosis not present

## 2019-07-02 DIAGNOSIS — E118 Type 2 diabetes mellitus with unspecified complications: Secondary | ICD-10-CM

## 2019-07-02 DIAGNOSIS — K219 Gastro-esophageal reflux disease without esophagitis: Secondary | ICD-10-CM | POA: Diagnosis not present

## 2019-07-02 DIAGNOSIS — I10 Essential (primary) hypertension: Secondary | ICD-10-CM | POA: Diagnosis not present

## 2019-07-02 DIAGNOSIS — E782 Mixed hyperlipidemia: Secondary | ICD-10-CM | POA: Diagnosis not present

## 2019-07-02 DIAGNOSIS — F411 Generalized anxiety disorder: Secondary | ICD-10-CM

## 2019-07-02 LAB — BAYER DCA HB A1C WAIVED: HB A1C (BAYER DCA - WAIVED): 6.7 % (ref <7.0)

## 2019-07-02 MED ORDER — TRAZODONE HCL 150 MG PO TABS: ORAL_TABLET | ORAL | 1 refills | Status: DC

## 2019-07-02 MED ORDER — CLOPIDOGREL BISULFATE 75 MG PO TABS: 75.0000 mg | ORAL_TABLET | Freq: Every day | ORAL | 0 refills | Status: DC

## 2019-07-02 MED ORDER — AMLODIPINE BESYLATE 10 MG PO TABS: ORAL_TABLET | ORAL | 1 refills | Status: DC

## 2019-07-02 MED ORDER — PANTOPRAZOLE SODIUM 40 MG PO TBEC: 40.0000 mg | DELAYED_RELEASE_TABLET | Freq: Every day | ORAL | 3 refills | Status: DC

## 2019-07-02 MED ORDER — ATORVASTATIN CALCIUM 80 MG PO TABS: ORAL_TABLET | ORAL | 0 refills | Status: DC

## 2019-07-02 MED ORDER — CELECOXIB 200 MG PO CAPS: ORAL_CAPSULE | ORAL | 0 refills | Status: DC

## 2019-07-02 MED ORDER — VALSARTAN-HYDROCHLOROTHIAZIDE 320-25 MG PO TABS: 1.0000 | ORAL_TABLET | Freq: Every day | ORAL | 1 refills | Status: DC

## 2019-07-02 MED ORDER — JANUMET XR 100-1000 MG PO TB24: 1.0000 | ORAL_TABLET | Freq: Every day | ORAL | 1 refills | Status: DC

## 2019-07-02 MED ORDER — SERTRALINE HCL 50 MG PO TABS: 50.0000 mg | ORAL_TABLET | Freq: Every day | ORAL | 1 refills | Status: DC

## 2019-07-02 MED ORDER — METOPROLOL SUCCINATE ER 200 MG PO TB24: ORAL_TABLET | ORAL | 1 refills | Status: DC

## 2019-07-02 NOTE — Progress Notes (Signed)
Subjective:    Patient ID: Marilyn Ford, female    DOB: 1961/02/08, 59 y.o.   MRN: [id number]   HPI: LAQUANDRA CARRILLO is a 59 y.o. female presenting for presents forFollow-up of diabetes. Patient checks blood sugar at home.   130-fasting and <180 postprandial Patient denies symptoms such as polyuria, polydipsia, excessive hunger, nausea No significant hypoglycemic spells noted. Medications reviewed. Pt reports taking them regularly without complication/adverse reaction being reported today.    presents for  follow-up of hypertension. Patient has no history of headache chest pain or shortness of breath or recent cough. Patient also denies symptoms of TIA such as focal numbness or weakness. Patient denies side effects from medication. States taking it regularly.  Lost her brother two weeks ago to [place]. Struggling to help her mother grieve.      Depression screen [facility] 2/9 07/02/2019 01/02/2019 06/02/2018 12/27/2017 08/16/2017  Decreased Interest '3 1 1 1 ' 0  Down, Depressed, Hopeless 3 1 0 1 1  PHQ - 2 Score '6 2 1 2 1  ' Altered sleeping 2 2 - 2 -  Tired, decreased energy 0 2 - 1 -  Change in appetite - 0 - 2 -  Feeling bad or failure about yourself  0 0 - 2 -  Trouble concentrating 1 1 - 1 -  Moving slowly or fidgety/restless 1 0 - 2 -  Suicidal thoughts 0 0 - 0 -  PHQ-9 Score 10 7 - 12 -  Difficult doing work/chores Somewhat difficult Somewhat difficult - - -  Some recent data might be hidden     Relevant past medical, surgical, family and social history reviewed and updated as indicated.  Interim medical history since our last visit reviewed. Allergies and medications reviewed and updated.  ROS:  Review of Systems  Constitutional: Negative.   HENT: Negative for congestion.   Eyes: Negative for visual disturbance.  Respiratory: Negative for shortness of breath.   Cardiovascular: Negative for chest pain.  Gastrointestinal: Negative for abdominal pain, constipation,  diarrhea, nausea and vomiting.  Genitourinary: Negative for difficulty urinating.  Musculoskeletal: Negative for arthralgias and myalgias.  Neurological: Negative for headaches.  Psychiatric/Behavioral: Negative for sleep disturbance.     Social History   Tobacco Use  Smoking Status Former Smoker  . Packs/day: 0.50  . Types: Cigarettes  . Start date: 12/08/1999  . Quit date: 03/12/2015  . Years since quitting: 4.3  Smokeless Tobacco Never Used       Objective:     Wt Readings from Last 3 Encounters:  12/08/18 177 lb (80.3 kg)  06/02/18 174 lb 2 oz (79 kg)  02/04/18 168 lb 14.4 oz (76.6 kg)     Exam deferred. Pt. Harboring due to COVID 19. Phone visit performed.   Assessment & Plan:   1. Controlled type 2 diabetes mellitus with complication, without long-term current use of insulin ([place])   2. Hemiparesis affecting right side as late effect of cerebrovascular accident (CVA) ([place])   3. Mixed hyperlipidemia   4. Essential hypertension   5. GAD (generalized anxiety disorder)   6. Insomnia due to medical condition   7. Gastroesophageal reflux disease without esophagitis     Meds ordered this encounter  Medications  . amLODipine (NORVASC) 10 MG tablet    Sig: 1 tablet daily    Dispense:  90 tablet    Refill:  1  . atorvastatin (LIPITOR) 80 MG tablet    Sig: TAKE 1 TABLET EVERY DAY AT [facility]  Dispense:  90 tablet    Refill:  0  . celecoxib (CELEBREX) 200 MG capsule    Sig: TAKE 1 CAPSULE EVERY DAY WITH FOOD    Dispense:  90 capsule    Refill:  0  . clopidogrel (PLAVIX) 75 MG tablet    Sig: Take 1 tablet (75 mg total) by mouth daily.    Dispense:  90 tablet    Refill:  0  . metoprolol (TOPROL-XL) 200 MG 24 hr tablet    Sig: TAKE 1 TABLET ONE TIME DAILY, TAKE WITH OR IMMEDIATELY FOLLOWING A MEAL    Dispense:  90 tablet    Refill:  1  . pantoprazole (PROTONIX) 40 MG tablet    Sig: Take 1 tablet (40 mg total) by mouth daily. For stomach    Dispense:  90 tablet     Refill:  3  . sertraline (ZOLOFT) 50 MG tablet    Sig: Take 1 tablet (50 mg total) by mouth at bedtime. For anxiety and depression    Dispense:  90 tablet    Refill:  1  . SitaGLIPtin-MetFORMIN HCl (JANUMET XR) [id number] MG TB24    Sig: Take 1 tablet by mouth daily. For diabetes    Dispense:  90 tablet    Refill:  1  . traZODone (DESYREL) 150 MG tablet    Sig: TAKE 1 OR 2 TABLETS AT BEDTIME FOR SLEEP    Dispense:  180 tablet    Refill:  1  . valsartan-hydrochlorothiazide (DIOVAN-HCT) 320-25 MG tablet    Sig: Take 1 tablet by mouth daily.    Dispense:  90 tablet    Refill:  1    Orders Placed This Encounter  Procedures  . CBC with Differential/Platelet  . CMP14+EGFR    Order Specific Question:   Has the patient fasted?    Answer:   Yes  . Bayer DCA Hb A1c Waived  . Lipid panel    Order Specific Question:   Has the patient fasted?    Answer:   Yes  . Ambulatory referral to diabetic education    Referral Priority:   Routine    Referral Type:   Consultation    Referral Reason:   Specialty Services Required    Number of Visits Requested:   1      Diagnoses and all orders for this visit:  Controlled type 2 diabetes mellitus with complication, without long-term current use of insulin ([place]) -     Ambulatory referral to diabetic education -     CBC with Differential/Platelet -     CMP14+EGFR -     Bayer DCA Hb A1c Waived -     Lipid panel -     atorvastatin (LIPITOR) 80 MG tablet; TAKE 1 TABLET EVERY DAY AT 6PM -     SitaGLIPtin-MetFORMIN HCl (JANUMET XR) [id number] MG TB24; Take 1 tablet by mouth daily. For diabetes -     valsartan-hydrochlorothiazide (DIOVAN-HCT) 320-25 MG tablet; Take 1 tablet by mouth daily.  Hemiparesis affecting right side as late effect of cerebrovascular accident (CVA) ([place]) -     CBC with Differential/Platelet -     CMP14+EGFR -     celecoxib (CELEBREX) 200 MG capsule; TAKE 1 CAPSULE EVERY DAY WITH FOOD -     clopidogrel (PLAVIX) 75 MG tablet; Take 1  tablet (75 mg total) by mouth daily.  Mixed hyperlipidemia -     CBC with Differential/Platelet -     CMP14+EGFR -  Lipid panel -     atorvastatin (LIPITOR) 80 MG tablet; TAKE 1 TABLET EVERY DAY AT 6PM  Essential hypertension -     CBC with Differential/Platelet -     CMP14+EGFR -     amLODipine (NORVASC) 10 MG tablet; 1 tablet daily -     metoprolol (TOPROL-XL) 200 MG 24 hr tablet; TAKE 1 TABLET ONE TIME DAILY, TAKE WITH OR IMMEDIATELY FOLLOWING A MEAL -     valsartan-hydrochlorothiazide (DIOVAN-HCT) 320-25 MG tablet; Take 1 tablet by mouth daily.  GAD (generalized anxiety disorder) -     sertraline (ZOLOFT) 50 MG tablet; Take 1 tablet (50 mg total) by mouth at bedtime. For anxiety and depression  Insomnia due to medical condition -     traZODone (DESYREL) 150 MG tablet; TAKE 1 OR 2 TABLETS AT BEDTIME FOR SLEEP  Gastroesophageal reflux disease without esophagitis -     pantoprazole (PROTONIX) 40 MG tablet; Take 1 tablet (40 mg total) by mouth daily. For stomach  Patient's not really monitoring her diabetes very closely at this point is my impression based on her conversation.  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  Unfortunately he had reminded over time several times of the need to monitor her blood sugar more closely.  And again she promises to do that.  She is taking her cholesterol and blood pressure medications regularly and those are stable today.  That is, pending the blood test results.  Although we did a telephone visit she is going to drop by for her A1c lipid and chemistry profile.  She is also treated for reflux and that is doing well.  Due to her previous stroke from which she has very minimal right sided weakness testing some 4-1/2 years later, she continues to take Plavix and aspirin daily.  She should follow-up every 3 months.  Will base recommendations for treatment on the above plus  lab results once they are available.  Virtual Visit via telephone Note  I discussed the limitations, risks, security and privacy concerns of performing an evaluation and management service by telephone and the availability of in person appointments. The patient was identified with two identifiers. Pt.expressed understanding and agreed to proceed. Pt. Is at home. Dr. Livia Snellen is in his office.  Follow Up Instructions:   I discussed the assessment and treatment plan with the patient. The patient was provided an opportunity to ask questions and all were answered. The patient agreed with the plan and demonstrated an understanding of the instructions.   The patient was advised to call back or seek an in-person evaluation if the symptoms worsen or if the condition fails to improve as anticipated.   Total minutes including chart review and phone contact time: 36   Follow up plan: Return in about 3 months (around 09/29/2019).  Claretta Fraise, MD [place]

## 2019-07-03 ENCOUNTER — Telehealth: Payer: Self-pay | Admitting: Family Medicine

## 2019-07-03 LAB — CMP14+EGFR
ALT: 27 IU/L (ref 0–32)
AST: 22 IU/L (ref 0–40)
Albumin/Globulin Ratio: 1.7 (ref 1.2–2.2)
Albumin: 4.5 g/dL (ref 3.8–4.9)
Alkaline Phosphatase: 109 IU/L (ref 39–117)
BUN/Creatinine Ratio: 12 (ref 9–23)
BUN: 12 mg/dL (ref 6–24)
Bilirubin Total: 0.3 mg/dL (ref 0.0–1.2)
CO2: 27 mmol/L (ref 20–29)
Calcium: 10 mg/dL (ref 8.7–10.2)
Chloride: 101 mmol/L (ref 96–106)
Creatinine, Ser: 1.02 mg/dL — ABNORMAL HIGH (ref 0.57–1.00)
GFR calc Af Amer: 70 mL/min/{1.73_m2} (ref >59)
GFR calc non Af Amer: 61 mL/min/{1.73_m2} (ref >59)
Globulin, Total: 2.7 g/dL (ref 1.5–4.5)
Glucose: 164 mg/dL — ABNORMAL HIGH (ref 65–99)
Potassium: 4.5 mmol/L (ref 3.5–5.2)
Sodium: 142 mmol/L (ref 134–144)
Total Protein: 7.2 g/dL (ref 6.0–8.5)

## 2019-07-03 LAB — CBC WITH DIFFERENTIAL/PLATELET
Basophils Absolute: 0.1 10*3/uL (ref 0.0–0.2)
Basos: 1 %
EOS (ABSOLUTE): 0.2 10*3/uL (ref 0.0–0.4)
Eos: 2 %
Hematocrit: 40.8 % (ref 34.0–46.6)
Hemoglobin: 13.7 g/dL (ref 11.1–15.9)
Immature Grans (Abs): 0 10*3/uL (ref 0.0–0.1)
Immature Granulocytes: 0 %
Lymphocytes Absolute: 2 10*3/uL (ref 0.7–3.1)
Lymphs: 22 %
MCH: 31 pg (ref 26.6–33.0)
MCHC: 33.6 g/dL (ref 31.5–35.7)
MCV: 92 fL (ref 79–97)
Monocytes Absolute: 0.6 10*3/uL (ref 0.1–0.9)
Monocytes: 6 %
Neutrophils Absolute: 6.1 10*3/uL (ref 1.4–7.0)
Neutrophils: 69 %
Platelets: 245 10*3/uL (ref 150–450)
RBC: 4.42 x10E6/uL (ref 3.77–5.28)
RDW: 12.6 % (ref 11.7–15.4)
WBC: 8.9 10*3/uL (ref 3.4–10.8)

## 2019-07-03 LAB — LIPID PANEL
Chol/HDL Ratio: 3.5 ratio (ref 0.0–4.4)
Cholesterol, Total: 156 mg/dL (ref 100–199)
HDL: 44 mg/dL (ref >39)
LDL Chol Calc (NIH): 72 mg/dL (ref 0–99)
Triglycerides: 243 mg/dL — ABNORMAL HIGH (ref 0–149)
VLDL Cholesterol Cal: 40 mg/dL (ref 5–40)

## 2019-07-03 NOTE — Telephone Encounter (Signed)
Aware.  Provider has not reviewed labs but we will call once he has signed them.

## 2019-07-06 NOTE — Progress Notes (Signed)
Hello Marilyn Ford,  Your lab result is normal and/or stable.Some minor variations that are not significant are commonly marked abnormal, but do not represent any medical problem for you.  Best regards, Cosima Prentiss, M.D.

## 2019-07-08 ENCOUNTER — Telehealth: Payer: Self-pay | Admitting: Family Medicine

## 2019-07-08 NOTE — Telephone Encounter (Signed)
lmtcb

## 2019-07-28 ENCOUNTER — Telehealth: Payer: Self-pay | Admitting: Family Medicine

## 2019-07-28 NOTE — Chronic Care Management (AMB) (Signed)
  Care Management   Note  07/28/2019 Name: IZEL ZICKEFOOSE MRN: [id number] DOB: Sep 05, 1960  [name] is a 59 y.o. year old female who is a primary care patient of Stacks, Cletus Gash, MD and is actively engaged with the care management team. I reached out to [name] by phone today to assist with scheduling a follow up visit with the RN Case Manager Pharmacist  Follow up plan: Unsuccessful telephone outreach attempt made. A HIPPA compliant phone message was left for the patient providing contact information and requesting a return call.  The care management team will reach out to the patient again over the next 7 days.  If patient returns call to provider office, please advise to call [facility]  at [place], [place], [place], [place] [address] Direct Dial: [phone] [email or web address] Website: [email or web address]

## 2019-07-29 NOTE — Chronic Care Management (AMB) (Signed)
°  Care Management   Note  07/29/2019 Name: Marilyn Ford MRN: [id number] DOB: [birth year]  Marilyn Ford is a 59 y.o. year old female who is a primary care patient of Stacks, Cletus Gash, MD and is actively engaged with the care management team. I reached out to Marilyn Ford by phone today to assist with scheduling a follow up visit with the RN Case Manager  Follow up plan: Telephone appointment with care management team member scheduled for:12/16/2019  Noreene Larsson, [address], [place], [place] [address] Direct Dial: [phone] [email or web address]: [email or web address]

## 2019-08-18 ENCOUNTER — Ambulatory Visit: Payer: Medicare HMO | Admitting: Nutrition

## 2019-08-24 ENCOUNTER — Other Ambulatory Visit: Payer: Self-pay | Admitting: *Deleted

## 2019-08-24 DIAGNOSIS — E118 Type 2 diabetes mellitus with unspecified complications: Secondary | ICD-10-CM

## 2019-08-24 DIAGNOSIS — E782 Mixed hyperlipidemia: Secondary | ICD-10-CM

## 2019-08-24 DIAGNOSIS — I10 Essential (primary) hypertension: Secondary | ICD-10-CM

## 2019-08-24 DIAGNOSIS — K219 Gastro-esophageal reflux disease without esophagitis: Secondary | ICD-10-CM

## 2019-08-24 DIAGNOSIS — I69351 Hemiplegia and hemiparesis following cerebral infarction affecting right dominant side: Secondary | ICD-10-CM

## 2019-08-24 MED ORDER — CELECOXIB 200 MG PO CAPS: ORAL_CAPSULE | ORAL | 0 refills | Status: DC

## 2019-08-24 MED ORDER — METOPROLOL SUCCINATE ER 200 MG PO TB24: ORAL_TABLET | ORAL | 0 refills | Status: DC

## 2019-08-24 MED ORDER — JANUMET XR 100-1000 MG PO TB24: 1.0000 | ORAL_TABLET | Freq: Every day | ORAL | 0 refills | Status: DC

## 2019-08-24 MED ORDER — ATORVASTATIN CALCIUM 80 MG PO TABS: ORAL_TABLET | ORAL | 0 refills | Status: DC

## 2019-08-24 MED ORDER — PANTOPRAZOLE SODIUM 40 MG PO TBEC: 40.0000 mg | DELAYED_RELEASE_TABLET | Freq: Every day | ORAL | 0 refills | Status: DC

## 2019-08-24 MED ORDER — VALSARTAN-HYDROCHLOROTHIAZIDE 320-25 MG PO TABS: 1.0000 | ORAL_TABLET | Freq: Every day | ORAL | 0 refills | Status: DC

## 2019-08-24 MED ORDER — CLOPIDOGREL BISULFATE 75 MG PO TABS: 75.0000 mg | ORAL_TABLET | Freq: Every day | ORAL | 0 refills | Status: DC

## 2019-09-10 ENCOUNTER — Telehealth: Payer: Self-pay | Admitting: Family Medicine

## 2019-09-30 ENCOUNTER — Encounter: Payer: Self-pay | Admitting: Family Medicine

## 2019-09-30 ENCOUNTER — Ambulatory Visit: Payer: Medicare HMO | Admitting: Family Medicine

## 2019-10-06 ENCOUNTER — Other Ambulatory Visit: Payer: Self-pay | Admitting: Family Medicine

## 2019-10-06 DIAGNOSIS — I10 Essential (primary) hypertension: Secondary | ICD-10-CM

## 2019-10-06 MED ORDER — AMLODIPINE BESYLATE 10 MG PO TABS: ORAL_TABLET | ORAL | 1 refills | Status: DC

## 2019-10-06 NOTE — Telephone Encounter (Signed)
  Prescription Request  10/06/2019  What is the name of the medication or equipment? amLODipine (NORVASC) 10 MG tablet  Have you contacted your pharmacy to request a refill? (if applicable) yes  Which pharmacy would you like this sent [facility]   Patient notified that their request is being sent to the clinical staff for review and that they should receive a response within 2 business days.

## 2019-10-06 NOTE — Telephone Encounter (Signed)
Refill sent.

## 2019-10-07 ENCOUNTER — Telehealth: Payer: Self-pay | Admitting: Family Medicine

## 2019-10-07 ENCOUNTER — Other Ambulatory Visit: Payer: Self-pay | Admitting: *Deleted

## 2019-10-07 DIAGNOSIS — I10 Essential (primary) hypertension: Secondary | ICD-10-CM

## 2019-10-07 MED ORDER — AMLODIPINE BESYLATE 10 MG PO TABS: ORAL_TABLET | ORAL | 1 refills | Status: DC

## 2019-10-07 NOTE — Telephone Encounter (Signed)
Left message,  Marilyn Ford says they will try to cancel the amlodipine script but it may have shipped .  Amlodipine script was sent to [facility] but they will not fill if [employer] used insurance already.

## 2019-10-07 NOTE — Telephone Encounter (Signed)
Patient aware, per message left on voice mail,  script of amlodipine is ready.

## 2019-10-20 ENCOUNTER — Other Ambulatory Visit: Payer: Self-pay

## 2019-10-20 ENCOUNTER — Ambulatory Visit (INDEPENDENT_AMBULATORY_CARE_PROVIDER_SITE_OTHER): Payer: Medicare HMO | Admitting: Family Medicine

## 2019-10-20 ENCOUNTER — Encounter: Payer: Self-pay | Admitting: Family Medicine

## 2019-10-20 VITALS — BP 128/70 | HR 68 | Temp 97.5°F | Ht 62.0 in | Wt 173.6 lb

## 2019-10-20 DIAGNOSIS — E118 Type 2 diabetes mellitus with unspecified complications: Secondary | ICD-10-CM | POA: Diagnosis not present

## 2019-10-20 DIAGNOSIS — I69351 Hemiplegia and hemiparesis following cerebral infarction affecting right dominant side: Secondary | ICD-10-CM | POA: Diagnosis not present

## 2019-10-20 DIAGNOSIS — E782 Mixed hyperlipidemia: Secondary | ICD-10-CM

## 2019-10-20 DIAGNOSIS — K219 Gastro-esophageal reflux disease without esophagitis: Secondary | ICD-10-CM

## 2019-10-20 DIAGNOSIS — G4701 Insomnia due to medical condition: Secondary | ICD-10-CM

## 2019-10-20 DIAGNOSIS — F411 Generalized anxiety disorder: Secondary | ICD-10-CM | POA: Diagnosis not present

## 2019-10-20 DIAGNOSIS — E785 Hyperlipidemia, unspecified: Secondary | ICD-10-CM | POA: Diagnosis not present

## 2019-10-20 DIAGNOSIS — I10 Essential (primary) hypertension: Secondary | ICD-10-CM | POA: Diagnosis not present

## 2019-10-20 DIAGNOSIS — E1165 Type 2 diabetes mellitus with hyperglycemia: Secondary | ICD-10-CM | POA: Diagnosis not present

## 2019-10-20 DIAGNOSIS — E1159 Type 2 diabetes mellitus with other circulatory complications: Secondary | ICD-10-CM | POA: Diagnosis not present

## 2019-10-20 LAB — BAYER DCA HB A1C WAIVED: HB A1C (BAYER DCA - WAIVED): 7 % — ABNORMAL HIGH (ref <7.0)

## 2019-10-20 MED ORDER — ATORVASTATIN CALCIUM 80 MG PO TABS: ORAL_TABLET | ORAL | 0 refills | Status: DC

## 2019-10-20 MED ORDER — CLOPIDOGREL BISULFATE 75 MG PO TABS: 75.0000 mg | ORAL_TABLET | Freq: Every day | ORAL | 0 refills | Status: DC

## 2019-10-20 MED ORDER — JANUMET XR 100-1000 MG PO TB24: 1.0000 | ORAL_TABLET | Freq: Every day | ORAL | 0 refills | Status: DC

## 2019-10-20 MED ORDER — CELECOXIB 200 MG PO CAPS: ORAL_CAPSULE | ORAL | 0 refills | Status: DC

## 2019-10-20 MED ORDER — PANTOPRAZOLE SODIUM 40 MG PO TBEC: 40.0000 mg | DELAYED_RELEASE_TABLET | Freq: Every day | ORAL | 0 refills | Status: DC

## 2019-10-20 MED ORDER — SERTRALINE HCL 50 MG PO TABS: 50.0000 mg | ORAL_TABLET | Freq: Every day | ORAL | 1 refills | Status: DC

## 2019-10-20 MED ORDER — VALSARTAN-HYDROCHLOROTHIAZIDE 320-25 MG PO TABS: 1.0000 | ORAL_TABLET | Freq: Every day | ORAL | 0 refills | Status: DC

## 2019-10-20 MED ORDER — METOPROLOL SUCCINATE ER 200 MG PO TB24: ORAL_TABLET | ORAL | 0 refills | Status: DC

## 2019-10-20 MED ORDER — TRAZODONE HCL 150 MG PO TABS: ORAL_TABLET | ORAL | 1 refills | Status: DC

## 2019-10-20 NOTE — Progress Notes (Signed)
Subjective:  Patient ID: Marilyn Ford, female    DOB: 01/03/1961  Age: 59 y.o. MRN: [id number]  CC: Follow-up (3 month)   HPI [name] presents forFollow-up of diabetes.    Eating extra starches.  Patient denies symptoms such as polyuria, polydipsia, excessive hunger, nausea No significant hypoglycemic spells noted. Medications reviewed. Pt reports taking them regularly without complication/adverse reaction being reported today.  Checking feet daily.  Patient in for follow-up of elevated cholesterol. Doing well without complaints on current medication. Denies side effects of statin including myalgia and arthralgia and nausea. Also in today for liver function testing. Currently no chest pain, shortness of breath or other cardiovascular related symptoms noted.   Follow-up of hypertension. Patient has no history of headache chest pain or shortness of breath or recent cough. Patient also denies symptoms of TIA such as numbness weakness lateralizing. Patient checks  blood pressure at home and has not had any elevated readings recently. Patient denies side effects from his medication. States taking it regularly.    History [name] has a past medical history of Diabetes mellitus without complication ([place]), Hypertension, Stroke ([place]), and Vaginal Pap smear, abnormal.   She has a past surgical history that includes Cholecystectomy and Cesarean section.   Her family history includes Diabetes in her brother and mother.She reports that she quit smoking about 4 years ago. Her smoking use included cigarettes. She started smoking about 19 years ago. She smoked 0.50 packs per day. She has never used smokeless tobacco. She reports current alcohol use of about 1.0 standard drink of alcohol per week. She reports that she does not use drugs.  Current Outpatient Medications on File Prior to Visit  Medication Sig Dispense Refill  . amLODipine (NORVASC) 10 MG tablet 1 tablet daily 90 tablet 1  .  aspirin 81 MG EC tablet Take 1 tablet (81 mg total) by mouth daily. 30 tablet 0  . FLUBLOK QUADRIVALENT 0.5 ML injection     . Multiple Vitamin (MULTIVITAMIN) capsule Take 1 capsule by mouth daily.     No current facility-administered medications on file prior to visit.    ROS Review of Systems  Constitutional: Negative.   HENT: Negative for congestion.   Eyes: Negative for visual disturbance.  Respiratory: Negative for shortness of breath.   Cardiovascular: Negative for chest pain.  Gastrointestinal: Negative for abdominal pain, constipation, diarrhea, nausea and vomiting.  Genitourinary: Negative for difficulty urinating.  Musculoskeletal: Negative for arthralgias and myalgias.  Neurological: Negative for headaches.  Psychiatric/Behavioral: Negative for sleep disturbance.    Objective:  BP 128/70   Pulse 68   Temp (!) 97.5 F (36.4 C) (Temporal)   Ht '5\' 2"'  (1.575 m)   Wt 173 lb 9.6 oz (78.7 kg)   BMI 31.75 kg/m   BP Readings from Last 3 Encounters:  10/20/19 128/70  12/08/18 (!) 147/79  06/02/18 (!) 158/87    Wt Readings from Last 3 Encounters:  10/20/19 173 lb 9.6 oz (78.7 kg)  12/08/18 177 lb (80.3 kg)  06/02/18 174 lb 2 oz (79 kg)     Physical Exam Constitutional:      General: She is not in acute distress.    Appearance: She is well-developed.  Cardiovascular:     Rate and Rhythm: Normal rate and regular rhythm.  Pulmonary:     Breath sounds: Normal breath sounds.  Skin:    General: Skin is warm and dry.  Neurological:     Mental Status: She is alert  and oriented to person, place, and time.    Results for orders placed or performed in visit on 10/20/19  Bayer DCA Hb A1c Waived  Result Value Ref Range   HB A1C (BAYER DCA - WAIVED) 7.0 (H) <7.0 %      Assessment & Plan:   Dejah was seen today for follow-up.  Diagnoses and all orders for this visit:  Controlled type 2 diabetes mellitus with complication, without long-term current use of  insulin ([facility]) -     CBC with Differential/Platelet -     CBC with Differential/Platelet -     CMP14+EGFR -     Cancel: Bayer DCA Hb A1c Waived -     Bayer DCA Hb A1c Waived -     atorvastatin (LIPITOR) 80 MG tablet; TAKE 1 TABLET EVERY DAY AT 6PM -     SitaGLIPtin-MetFORMIN HCl (JANUMET XR) [phone] MG TB24; Take 1 tablet by mouth daily. For diabetes -     valsartan-hydrochlorothiazide (DIOVAN-HCT) 320-25 MG tablet; Take 1 tablet by mouth daily.  Essential hypertension -     CBC with Differential/Platelet -     CBC with Differential/Platelet -     CMP14+EGFR -     metoprolol (TOPROL-XL) 200 MG 24 hr tablet; TAKE 1 TABLET ONE TIME DAILY, TAKE WITH OR IMMEDIATELY FOLLOWING A MEAL -     valsartan-hydrochlorothiazide (DIOVAN-HCT) 320-25 MG tablet; Take 1 tablet by mouth daily.  Mixed hyperlipidemia -     Lipid panel -     CBC with Differential/Platelet -     CBC with Differential/Platelet -     CMP14+EGFR -     Lipid panel -     atorvastatin (LIPITOR) 80 MG tablet; TAKE 1 TABLET EVERY DAY AT 6PM  Hemiparesis affecting right side as late effect of cerebrovascular accident (CVA) ([facility]) -     celecoxib (CELEBREX) 200 MG capsule; TAKE 1 CAPSULE EVERY DAY WITH FOOD -     clopidogrel (PLAVIX) 75 MG tablet; Take 1 tablet (75 mg total) by mouth daily.  Gastroesophageal reflux disease without esophagitis -     pantoprazole (PROTONIX) 40 MG tablet; Take 1 tablet (40 mg total) by mouth daily. For stomach  GAD (generalized anxiety disorder) -     sertraline (ZOLOFT) 50 MG tablet; Take 1 tablet (50 mg total) by mouth at bedtime. For anxiety and depression  Insomnia due to medical condition -     traZODone (DESYREL) 150 MG tablet; TAKE 1 OR 2 TABLETS AT BEDTIME FOR SLEEP      I am having Alaney T. Baucum maintain her multivitamin, aspirin, Flublok Quadrivalent, amLODipine, atorvastatin, celecoxib, clopidogrel, metoprolol, pantoprazole, sertraline, Janumet XR, traZODone, and  valsartan-hydrochlorothiazide.  Meds ordered this encounter  Medications  . atorvastatin (LIPITOR) 80 MG tablet    Sig: TAKE 1 TABLET EVERY DAY AT 6PM    Dispense:  90 tablet    Refill:  0  . celecoxib (CELEBREX) 200 MG capsule    Sig: TAKE 1 CAPSULE EVERY DAY WITH FOOD    Dispense:  90 capsule    Refill:  0  . clopidogrel (PLAVIX) 75 MG tablet    Sig: Take 1 tablet (75 mg total) by mouth daily.    Dispense:  90 tablet    Refill:  0  . metoprolol (TOPROL-XL) 200 MG 24 hr tablet    Sig: TAKE 1 TABLET ONE TIME DAILY, TAKE WITH OR IMMEDIATELY FOLLOWING A MEAL    Dispense:  90 tablet    Refill:  0  . pantoprazole (PROTONIX) 40 MG tablet    Sig: Take 1 tablet (40 mg total) by mouth daily. For stomach    Dispense:  90 tablet    Refill:  0  . sertraline (ZOLOFT) 50 MG tablet    Sig: Take 1 tablet (50 mg total) by mouth at bedtime. For anxiety and depression    Dispense:  90 tablet    Refill:  1  . SitaGLIPtin-MetFORMIN HCl (JANUMET XR) [phone] MG TB24    Sig: Take 1 tablet by mouth daily. For diabetes    Dispense:  90 tablet    Refill:  0  . traZODone (DESYREL) 150 MG tablet    Sig: TAKE 1 OR 2 TABLETS AT BEDTIME FOR SLEEP    Dispense:  180 tablet    Refill:  1  . valsartan-hydrochlorothiazide (DIOVAN-HCT) 320-25 MG tablet    Sig: Take 1 tablet by mouth daily.    Dispense:  90 tablet    Refill:  0     Follow-up: Return in about 3 months (around 01/20/2020) for diabetes.  Claretta Fraise, M.D.

## 2019-10-21 LAB — CMP14+EGFR
ALT: 33 IU/L — ABNORMAL HIGH (ref 0–32)
AST: 26 IU/L (ref 0–40)
Albumin/Globulin Ratio: 1.6 (ref 1.2–2.2)
Albumin: 4.5 g/dL (ref 3.8–4.9)
Alkaline Phosphatase: 117 IU/L (ref 48–121)
BUN/Creatinine Ratio: 13 (ref 9–23)
BUN: 12 mg/dL (ref 6–24)
Bilirubin Total: 0.4 mg/dL (ref 0.0–1.2)
CO2: 26 mmol/L (ref 20–29)
Calcium: 10.5 mg/dL — ABNORMAL HIGH (ref 8.7–10.2)
Chloride: 99 mmol/L (ref 96–106)
Creatinine, Ser: 0.94 mg/dL (ref 0.57–1.00)
GFR calc Af Amer: 77 mL/min/{1.73_m2} (ref >59)
GFR calc non Af Amer: 67 mL/min/{1.73_m2} (ref >59)
Globulin, Total: 2.8 g/dL (ref 1.5–4.5)
Glucose: 169 mg/dL — ABNORMAL HIGH (ref 65–99)
Potassium: 4.8 mmol/L (ref 3.5–5.2)
Sodium: 141 mmol/L (ref 134–144)
Total Protein: 7.3 g/dL (ref 6.0–8.5)

## 2019-10-21 LAB — CBC WITH DIFFERENTIAL/PLATELET
Basophils Absolute: 0.1 10*3/uL (ref 0.0–0.2)
Basos: 1 %
EOS (ABSOLUTE): 0.1 10*3/uL (ref 0.0–0.4)
Eos: 2 %
Hematocrit: 42 % (ref 34.0–46.6)
Hemoglobin: 14.1 g/dL (ref 11.1–15.9)
Immature Grans (Abs): 0 10*3/uL (ref 0.0–0.1)
Immature Granulocytes: 0 %
Lymphocytes Absolute: 1.7 10*3/uL (ref 0.7–3.1)
Lymphs: 21 %
MCH: 30.7 pg (ref 26.6–33.0)
MCHC: 33.6 g/dL (ref 31.5–35.7)
MCV: 91 fL (ref 79–97)
Monocytes Absolute: 0.5 10*3/uL (ref 0.1–0.9)
Monocytes: 6 %
Neutrophils Absolute: 5.6 10*3/uL (ref 1.4–7.0)
Neutrophils: 70 %
Platelets: 227 10*3/uL (ref 150–450)
RBC: 4.6 x10E6/uL (ref 3.77–5.28)
RDW: 13 % (ref 11.7–15.4)
WBC: 8 10*3/uL (ref 3.4–10.8)

## 2019-10-21 LAB — LIPID PANEL
Chol/HDL Ratio: 3.5 ratio (ref 0.0–4.4)
Cholesterol, Total: 149 mg/dL (ref 100–199)
HDL: 42 mg/dL (ref >39)
LDL Chol Calc (NIH): 70 mg/dL (ref 0–99)
Triglycerides: 223 mg/dL — ABNORMAL HIGH (ref 0–149)
VLDL Cholesterol Cal: 37 mg/dL (ref 5–40)

## 2019-10-23 NOTE — Progress Notes (Signed)
Hello Marilyn Ford,  Your lab result is normal and/or stable.Some minor variations that are not significant are commonly marked abnormal, but do not represent any medical problem for you.  Best regards, Claretta Fraise, M.D.

## 2019-11-19 ENCOUNTER — Other Ambulatory Visit: Payer: Self-pay | Admitting: Family Medicine

## 2019-11-19 DIAGNOSIS — Z1231 Encounter for screening mammogram for malignant neoplasm of breast: Secondary | ICD-10-CM

## 2019-11-24 ENCOUNTER — Telehealth: Payer: Self-pay | Admitting: Family Medicine

## 2019-11-24 NOTE — Chronic Care Management (AMB) (Signed)
  Care Management   Note  11/24/2019 Name: Marilyn Ford MRN: [id number] DOB: 03/19/1961  Marilyn Ford is a 59 y.o. year old female who is a primary care patient of Stacks, Cletus Gash, MD and is actively engaged with the care management team. I reached out to Marilyn Ford by phone today to assist with re-scheduling a follow up visit with the RN Case Manager  Follow up plan: Telephone appointment with care management team member scheduled for:12/18/2019  Noreene Larsson, [place], [place], [place] [address] Direct Dial: [phone] [email or web address]: [email or web address]

## 2019-12-06 ENCOUNTER — Other Ambulatory Visit: Payer: Self-pay | Admitting: Family Medicine

## 2019-12-06 DIAGNOSIS — E118 Type 2 diabetes mellitus with unspecified complications: Secondary | ICD-10-CM

## 2019-12-16 ENCOUNTER — Telehealth: Payer: Medicare HMO

## 2019-12-18 ENCOUNTER — Telehealth: Payer: Medicare HMO

## 2019-12-21 ENCOUNTER — Other Ambulatory Visit: Payer: Self-pay

## 2019-12-21 DIAGNOSIS — E118 Type 2 diabetes mellitus with unspecified complications: Secondary | ICD-10-CM

## 2019-12-21 MED ORDER — JANUMET XR 100-1000 MG PO TB24: ORAL_TABLET | ORAL | 0 refills | Status: DC

## 2019-12-30 ENCOUNTER — Other Ambulatory Visit: Payer: Self-pay

## 2019-12-30 ENCOUNTER — Ambulatory Visit
Admission: RE | Admit: 2019-12-30 | Discharge: 2019-12-30 | Disposition: A | Discharge status: Home / Self Care | Payer: Medicare HMO | Source: Ambulatory Visit | Attending: Family Medicine | Admitting: Family Medicine

## 2019-12-30 DIAGNOSIS — Z1231 Encounter for screening mammogram for malignant neoplasm of breast: Secondary | ICD-10-CM

## 2020-01-16 ENCOUNTER — Other Ambulatory Visit: Payer: Self-pay | Admitting: Family Medicine

## 2020-01-16 DIAGNOSIS — E782 Mixed hyperlipidemia: Secondary | ICD-10-CM

## 2020-01-16 DIAGNOSIS — I69351 Hemiplegia and hemiparesis following cerebral infarction affecting right dominant side: Secondary | ICD-10-CM

## 2020-01-16 DIAGNOSIS — K219 Gastro-esophageal reflux disease without esophagitis: Secondary | ICD-10-CM

## 2020-01-16 DIAGNOSIS — E118 Type 2 diabetes mellitus with unspecified complications: Secondary | ICD-10-CM

## 2020-01-16 DIAGNOSIS — I10 Essential (primary) hypertension: Secondary | ICD-10-CM

## 2020-01-20 ENCOUNTER — Ambulatory Visit: Payer: Medicare HMO | Admitting: Family Medicine

## 2020-01-28 ENCOUNTER — Ambulatory Visit (INDEPENDENT_AMBULATORY_CARE_PROVIDER_SITE_OTHER): Payer: Medicare HMO | Admitting: Family Medicine

## 2020-01-28 ENCOUNTER — Encounter: Payer: Self-pay | Admitting: Family Medicine

## 2020-01-28 ENCOUNTER — Other Ambulatory Visit: Payer: Self-pay

## 2020-01-28 VITALS — BP 130/86 | HR 73 | Temp 97.5°F | Resp 20 | Ht 62.0 in | Wt 173.0 lb

## 2020-01-28 DIAGNOSIS — E118 Type 2 diabetes mellitus with unspecified complications: Secondary | ICD-10-CM

## 2020-01-28 DIAGNOSIS — I10 Essential (primary) hypertension: Secondary | ICD-10-CM

## 2020-01-28 DIAGNOSIS — E782 Mixed hyperlipidemia: Secondary | ICD-10-CM | POA: Diagnosis not present

## 2020-01-28 DIAGNOSIS — Z1211 Encounter for screening for malignant neoplasm of colon: Secondary | ICD-10-CM | POA: Diagnosis not present

## 2020-01-28 LAB — CMP14+EGFR
ALT: 35 IU/L — ABNORMAL HIGH (ref 0–32)
AST: 30 IU/L (ref 0–40)
Albumin/Globulin Ratio: 1.8 (ref 1.2–2.2)
Albumin: 4.6 g/dL (ref 3.8–4.9)
Alkaline Phosphatase: 107 IU/L (ref 44–121)
BUN/Creatinine Ratio: 12 (ref 9–23)
BUN: 14 mg/dL (ref 6–24)
Bilirubin Total: 0.4 mg/dL (ref 0.0–1.2)
CO2: 25 mmol/L (ref 20–29)
Calcium: 10.4 mg/dL — ABNORMAL HIGH (ref 8.7–10.2)
Chloride: 97 mmol/L (ref 96–106)
Creatinine, Ser: 1.19 mg/dL — ABNORMAL HIGH (ref 0.57–1.00)
GFR calc Af Amer: 58 mL/min/{1.73_m2} — ABNORMAL LOW (ref >59)
GFR calc non Af Amer: 50 mL/min/{1.73_m2} — ABNORMAL LOW (ref >59)
Globulin, Total: 2.5 g/dL (ref 1.5–4.5)
Glucose: 149 mg/dL — ABNORMAL HIGH (ref 65–99)
Potassium: 4.8 mmol/L (ref 3.5–5.2)
Sodium: 139 mmol/L (ref 134–144)
Total Protein: 7.1 g/dL (ref 6.0–8.5)

## 2020-01-28 LAB — CBC WITH DIFFERENTIAL/PLATELET
Basophils Absolute: 0.1 10*3/uL (ref 0.0–0.2)
Basos: 1 %
EOS (ABSOLUTE): 0.2 10*3/uL (ref 0.0–0.4)
Eos: 2 %
Hematocrit: 41.3 % (ref 34.0–46.6)
Hemoglobin: 14 g/dL (ref 11.1–15.9)
Immature Grans (Abs): 0 10*3/uL (ref 0.0–0.1)
Immature Granulocytes: 0 %
Lymphocytes Absolute: 1.6 10*3/uL (ref 0.7–3.1)
Lymphs: 19 %
MCH: 31.2 pg (ref 26.6–33.0)
MCHC: 33.9 g/dL (ref 31.5–35.7)
MCV: 92 fL (ref 79–97)
Monocytes Absolute: 0.6 10*3/uL (ref 0.1–0.9)
Monocytes: 6 %
Neutrophils Absolute: 6.2 10*3/uL (ref 1.4–7.0)
Neutrophils: 72 %
Platelets: 213 10*3/uL (ref 150–450)
RBC: 4.49 x10E6/uL (ref 3.77–5.28)
RDW: 12.9 % (ref 11.7–15.4)
WBC: 8.6 10*3/uL (ref 3.4–10.8)

## 2020-01-28 LAB — LIPID PANEL
Chol/HDL Ratio: 4.2 ratio (ref 0.0–4.4)
Cholesterol, Total: 151 mg/dL (ref 100–199)
HDL: 36 mg/dL — ABNORMAL LOW (ref >39)
LDL Chol Calc (NIH): 68 mg/dL (ref 0–99)
Triglycerides: 297 mg/dL — ABNORMAL HIGH (ref 0–149)
VLDL Cholesterol Cal: 47 mg/dL — ABNORMAL HIGH (ref 5–40)

## 2020-01-28 LAB — BAYER DCA HB A1C WAIVED: HB A1C (BAYER DCA - WAIVED): 6.9 % (ref <7.0)

## 2020-01-28 NOTE — Progress Notes (Signed)
Subjective:  Patient ID: Marilyn Ford,  female    DOB: 1960-11-26  Age: 59 y.o.    CC: Medical Management of Chronic Issues   HPI Marilyn Ford presents for  follow-up of hypertension. Patient has no history of headache chest pain or shortness of breath or recent cough. Patient also denies symptoms of TIA such as numbness weakness lateralizing. Patient denies side effects from medication. States taking it regularly.  Patient also  in for follow-up of elevated cholesterol. Doing well without complaints on current medication. Denies side effects  including myalgia and arthralgia and nausea. Also in today for liver function testing. Currently no chest pain, shortness of breath or other cardiovascular related symptoms noted.  Follow-up of diabetes. Patient does check blood sugar at home.  Log not returned. Patient denies symptoms such as excessive hunger or urinary frequency, excessive hunger, nausea No significant hypoglycemic spells noted. Medications reviewed. Pt reports taking them regularly. Pt. denies complication/adverse reaction today.  However, she can no longer afford the Janumet.  Patient requests referral for colonoscopy  Depression screen [facility] 2/9 01/28/2020 10/20/2019 07/02/2019 01/02/2019 06/02/2018  Decreased Interest 0 0 '3 1 1  ' Down, Depressed, Hopeless 0 0 3 1 0  PHQ - 2 Score 0 0 '6 2 1  ' Altered sleeping - - 2 2 -  Tired, decreased energy - - 0 2 -  Change in appetite - - - 0 -  Feeling bad or failure about yourself  - - 0 0 -  Trouble concentrating - - 1 1 -  Moving slowly or fidgety/restless - - 1 0 -  Suicidal thoughts - - 0 0 -  PHQ-9 Score - - 10 7 -  Difficult doing work/chores - - Somewhat difficult Somewhat difficult -  Some recent data might be hidden    History Marilyn Ford has a past medical history of Diabetes mellitus without complication ([place]), Hypertension, Stroke ([place]), and Vaginal Pap smear, abnormal.   She has a past surgical history that includes  Cholecystectomy and Cesarean section.   Her family history includes Diabetes in her brother and mother.She reports that she quit smoking about 4 years ago. Her smoking use included cigarettes. She started smoking about 20 years ago. She smoked 0.50 packs per day. She has never used smokeless tobacco. She reports current alcohol use of about 1.0 standard drink of alcohol per week. She reports that she does not use drugs.  Current Outpatient Medications on File Prior to Visit  Medication Sig Dispense Refill  . amLODipine (NORVASC) 10 MG tablet 1 tablet daily 90 tablet 1  . aspirin 81 MG EC tablet Take 1 tablet (81 mg total) by mouth daily. 30 tablet 0  . atorvastatin (LIPITOR) 80 MG tablet TAKE 1 TABLET EVERY DAY AT 6PM 90 tablet 0  . celecoxib (CELEBREX) 200 MG capsule TAKE 1 CAPSULE EVERY DAY WITH FOOD 90 capsule 0  . clopidogrel (PLAVIX) 75 MG tablet Take 1 tablet (75 mg total) by mouth daily. 90 tablet 0  . metoprolol (TOPROL-XL) 200 MG 24 hr tablet TAKE 1 TABLET ONE TIME DAILY, WITH OR IMMEDIATELY FOLLOWING A MEAL 90 tablet 0  . Multiple Vitamin (MULTIVITAMIN) capsule Take 1 capsule by mouth daily.    . sertraline (ZOLOFT) 50 MG tablet Take 1 tablet (50 mg total) by mouth at bedtime. For anxiety and depression 90 tablet 1  . SitaGLIPtin-MetFORMIN HCl (JANUMET XR) [phone] MG TB24 TAKE 1 TABLET BY MOUTH ONCE DAILY FOR DIABETES 90 tablet 0  .  traZODone (DESYREL) 150 MG tablet TAKE 1 OR 2 TABLETS AT BEDTIME FOR SLEEP 180 tablet 1  . valsartan-hydrochlorothiazide (DIOVAN-HCT) 320-25 MG tablet TAKE 1 TABLET EVERY DAY 90 tablet 0  . FLUBLOK QUADRIVALENT 0.5 ML injection  (Patient not taking: Reported on 01/28/2020)    . pantoprazole (PROTONIX) 40 MG tablet TAKE 1 TABLET BY MOUTH DAILY FOR STOMACH (Patient not taking: Reported on 01/28/2020) 90 tablet 0   No current facility-administered medications on file prior to visit.    ROS Review of Systems  Objective:  BP 130/86   Pulse 73   Temp (!)  97.5 F (36.4 C) (Temporal)   Resp 20   Ht '5\' 2"'  (1.575 m)   Wt 173 lb (78.5 kg)   SpO2 98%   BMI 31.64 kg/m   BP Readings from Last 3 Encounters:  01/28/20 130/86  10/20/19 128/70  12/08/18 (!) 147/79    Wt Readings from Last 3 Encounters:  01/28/20 173 lb (78.5 kg)  10/20/19 173 lb 9.6 oz (78.7 kg)  12/08/18 177 lb (80.3 kg)     Physical Exam  Diabetic Foot Exam - Simple   No data filed        Assessment & Plan:   Marilyn Ford was seen today for medical management of chronic issues.  Diagnoses and all orders for this visit:  Controlled type 2 diabetes mellitus with complication, without long-term current use of insulin (HCC) -     Microalbumin / creatinine urine ratio -     Bayer DCA Hb A1c Waived -     CBC with Differential/Platelet -     CMP14+EGFR -     Lipid panel  Essential hypertension -     Microalbumin / creatinine urine ratio -     Bayer DCA Hb A1c Waived -     CBC with Differential/Platelet -     CMP14+EGFR -     Lipid panel  Mixed hyperlipidemia -     Microalbumin / creatinine urine ratio -     Bayer DCA Hb A1c Waived -     CBC with Differential/Platelet -     CMP14+EGFR -     Lipid panel  Screen for colon cancer -     Ambulatory referral to Gastroenterology   I am having Marilyn Ford maintain her multivitamin, aspirin, Flublok Quadrivalent, amLODipine, clopidogrel, sertraline, traZODone, Janumet XR, atorvastatin, metoprolol, celecoxib, pantoprazole, and valsartan-hydrochlorothiazide.  No orders of the defined types were placed in this encounter.    Follow-up: Return in about 3 months (around 04/28/2020).  Claretta Fraise, M.D.

## 2020-01-29 ENCOUNTER — Other Ambulatory Visit: Payer: Self-pay | Admitting: Family Medicine

## 2020-01-29 DIAGNOSIS — Z1211 Encounter for screening for malignant neoplasm of colon: Secondary | ICD-10-CM

## 2020-01-29 NOTE — Progress Notes (Signed)
Hello Aleasia,  Your lab result is normal and/or stable.Some minor variations that are not significant are commonly marked abnormal, but do not represent any medical problem for you.  Best regards, Claretta Fraise, M.D.

## 2020-02-02 ENCOUNTER — Encounter (INDEPENDENT_AMBULATORY_CARE_PROVIDER_SITE_OTHER): Payer: Self-pay

## 2020-02-02 NOTE — Chronic Care Management (AMB) (Signed)
  Chronic Care Management   Note  03/27/2019 Name: Marilyn Ford MRN: [id number] DOB: January 15, 1961  Patient does not have any CCM or resource needs and feels that her medical conditions are well managed at this time and therefore CCM services are not needed. She will reach out in the future if the need arises.   Follow up plan: CCM enrollment status changed to "previously enrolled" as per patient request on 03/27/2019 to discontinue enrollment. Case closed to case management services in primary care home.   Chong Sicilian, BSN, RN-BC Embedded Chronic Care Manager [facility] Management Direct Dial: [phone]

## 2020-02-02 NOTE — Patient Instructions (Signed)
Marilyn Ford, BSN, RN-BC Embedded Chronic Care Manager [facility] Care Management Direct Dial: [phone]    

## 2020-02-09 ENCOUNTER — Telehealth: Payer: Self-pay

## 2020-02-09 DIAGNOSIS — E118 Type 2 diabetes mellitus with unspecified complications: Secondary | ICD-10-CM

## 2020-02-09 MED ORDER — JANUMET XR 100-1000 MG PO TB24: ORAL_TABLET | ORAL | 0 refills | Status: DC

## 2020-02-09 NOTE — Telephone Encounter (Signed)
  Prescription Request  02/09/2020  What is the name of the medication or equipment? SitaGLIPtin-MetFORMIN HCl (JANUMET XR) [phone] MG TB24  Have you contacted your pharmacy to request a refill? (if applicable) no  Which pharmacy would you like this sent to? [facility]   Patient notified that their request is being sent to the clinical staff for review and that they should receive a response within 2 business days.

## 2020-02-09 NOTE — Telephone Encounter (Signed)
Left message advising requested rx sent into pharmacy and to call back with any further questions or concerns.

## 2020-02-15 ENCOUNTER — Telehealth: Payer: Self-pay

## 2020-02-15 ENCOUNTER — Other Ambulatory Visit: Payer: Self-pay | Admitting: Family Medicine

## 2020-02-15 MED ORDER — SITAGLIPTIN PHOSPHATE 100 MG PO TABS: 100.0000 mg | ORAL_TABLET | Freq: Every day | ORAL | 2 refills | Status: DC

## 2020-02-15 MED ORDER — METFORMIN HCL ER 500 MG PO TB24: 1000.0000 mg | ORAL_TABLET | Freq: Every day | ORAL | 2 refills | Status: DC

## 2020-02-15 NOTE — Telephone Encounter (Signed)
Please let the patient know that I sent their prescription to their pharmacy. Thanks, WS 

## 2020-02-15 NOTE — Telephone Encounter (Signed)
Rx that was sent to [facility] for Sitagliptin was never received per pharmacy member. Need to resend but pt says she wants the metformin and janumet to be taken separately, not together, because her insurance isnt paying for it.

## 2020-02-16 NOTE — Telephone Encounter (Signed)
LMTCB

## 2020-02-17 NOTE — Telephone Encounter (Signed)
Prescription sent and received by [facility] in [place] on 02/15/20

## 2020-03-23 ENCOUNTER — Other Ambulatory Visit: Payer: Self-pay | Admitting: *Deleted

## 2020-03-23 MED ORDER — METFORMIN HCL ER 500 MG PO TB24
1000.0000 mg | ORAL_TABLET | Freq: Every day | ORAL | 0 refills | Status: DC
Start: 2020-03-23 — End: 2020-04-07

## 2020-04-07 ENCOUNTER — Other Ambulatory Visit: Payer: Self-pay | Admitting: *Deleted

## 2020-04-07 DIAGNOSIS — I10 Essential (primary) hypertension: Secondary | ICD-10-CM

## 2020-04-07 MED ORDER — METFORMIN HCL ER 500 MG PO TB24
1000.0000 mg | ORAL_TABLET | Freq: Every day | ORAL | 0 refills | Status: DC
Start: 2020-04-07 — End: 2020-05-02

## 2020-04-07 MED ORDER — AMLODIPINE BESYLATE 10 MG PO TABS: ORAL_TABLET | ORAL | 0 refills | Status: DC

## 2020-05-02 ENCOUNTER — Ambulatory Visit (INDEPENDENT_AMBULATORY_CARE_PROVIDER_SITE_OTHER): Payer: Medicare HMO | Admitting: Family Medicine

## 2020-05-02 ENCOUNTER — Encounter: Payer: Self-pay | Admitting: Family Medicine

## 2020-05-02 ENCOUNTER — Other Ambulatory Visit: Payer: Self-pay

## 2020-05-02 VITALS — BP 152/86 | HR 73 | Temp 97.8°F | Resp 20 | Ht 62.0 in | Wt 171.2 lb

## 2020-05-02 DIAGNOSIS — E118 Type 2 diabetes mellitus with unspecified complications: Secondary | ICD-10-CM | POA: Diagnosis not present

## 2020-05-02 DIAGNOSIS — I10 Essential (primary) hypertension: Secondary | ICD-10-CM | POA: Diagnosis not present

## 2020-05-02 DIAGNOSIS — E782 Mixed hyperlipidemia: Secondary | ICD-10-CM | POA: Diagnosis not present

## 2020-05-02 LAB — CBC WITH DIFFERENTIAL/PLATELET
Basophils Absolute: 0.1 10*3/uL (ref 0.0–0.2)
Basos: 1 %
EOS (ABSOLUTE): 0.2 10*3/uL (ref 0.0–0.4)
Eos: 2 %
Hematocrit: 40.7 % (ref 34.0–46.6)
Hemoglobin: 14 g/dL (ref 11.1–15.9)
Immature Grans (Abs): 0 10*3/uL (ref 0.0–0.1)
Immature Granulocytes: 0 %
Lymphocytes Absolute: 1.5 10*3/uL (ref 0.7–3.1)
Lymphs: 18 %
MCH: 31.3 pg (ref 26.6–33.0)
MCHC: 34.4 g/dL (ref 31.5–35.7)
MCV: 91 fL (ref 79–97)
Monocytes Absolute: 0.5 10*3/uL (ref 0.1–0.9)
Monocytes: 6 %
Neutrophils Absolute: 5.8 10*3/uL (ref 1.4–7.0)
Neutrophils: 73 %
Platelets: 234 10*3/uL (ref 150–450)
RBC: 4.47 x10E6/uL (ref 3.77–5.28)
RDW: 13.1 % (ref 11.7–15.4)
WBC: 8 10*3/uL (ref 3.4–10.8)

## 2020-05-02 LAB — LIPID PANEL
Chol/HDL Ratio: 3.6 ratio (ref 0.0–4.4)
Cholesterol, Total: 138 mg/dL (ref 100–199)
HDL: 38 mg/dL — ABNORMAL LOW (ref >39)
LDL Chol Calc (NIH): 63 mg/dL (ref 0–99)
Triglycerides: 231 mg/dL — ABNORMAL HIGH (ref 0–149)
VLDL Cholesterol Cal: 37 mg/dL (ref 5–40)

## 2020-05-02 LAB — CMP14+EGFR
ALT: 27 IU/L (ref 0–32)
AST: 28 IU/L (ref 0–40)
Albumin/Globulin Ratio: 1.5 (ref 1.2–2.2)
Albumin: 4.2 g/dL (ref 3.8–4.9)
Alkaline Phosphatase: 89 IU/L (ref 44–121)
BUN/Creatinine Ratio: 11 (ref 9–23)
BUN: 12 mg/dL (ref 6–24)
Bilirubin Total: 0.4 mg/dL (ref 0.0–1.2)
CO2: 26 mmol/L (ref 20–29)
Calcium: 10.6 mg/dL — ABNORMAL HIGH (ref 8.7–10.2)
Chloride: 96 mmol/L (ref 96–106)
Creatinine, Ser: 1.06 mg/dL — ABNORMAL HIGH (ref 0.57–1.00)
GFR calc Af Amer: 66 mL/min/{1.73_m2} (ref >59)
GFR calc non Af Amer: 58 mL/min/{1.73_m2} — ABNORMAL LOW (ref >59)
Globulin, Total: 2.8 g/dL (ref 1.5–4.5)
Glucose: 172 mg/dL — ABNORMAL HIGH (ref 65–99)
Potassium: 5.1 mmol/L (ref 3.5–5.2)
Sodium: 137 mmol/L (ref 134–144)
Total Protein: 7 g/dL (ref 6.0–8.5)

## 2020-05-02 LAB — BAYER DCA HB A1C WAIVED: HB A1C (BAYER DCA - WAIVED): 7.1 % — ABNORMAL HIGH (ref <7.0)

## 2020-05-02 MED ORDER — JANUMET XR 100-1000 MG PO TB24: 1.0000 | ORAL_TABLET | Freq: Every day | ORAL | 1 refills | Status: DC

## 2020-05-02 NOTE — Progress Notes (Signed)
Subjective:  Patient ID: Marilyn Ford,  female    DOB: March 30, 1961  Age: 59 y.o.    CC: Medical Management of Chronic Issues   HPI Marilyn Ford presents for  follow-up of hypertension. Patient has no history of headache chest pain or shortness of breath or recent cough. Patient also denies symptoms of TIA such as numbness weakness lateralizing. Patient denies side effects from medication. States taking it regularly.  Patient also  in for follow-up of elevated cholesterol. Doing well without complaints on current medication. Denies side effects  including myalgia and arthralgia and nausea. Also in today for liver function testing. Currently no chest pain, shortness of breath or other cardiovascular related symptoms noted.  Follow-up of diabetes. Patient does check blood sugar at home. Readings run mostly 100 250. Patient denies symptoms such as excessive hunger or urinary frequency, excessive hunger, nausea No significant hypoglycemic spells noted. Medications reviewed. Pt reports taking them regularly. Pt. denies complication/adverse reaction today.    History Marilyn Ford has a past medical history of Diabetes mellitus without complication ([place]), Hypertension, Stroke ([place]), and Vaginal Pap smear, abnormal.   She has a past surgical history that includes Cholecystectomy and Cesarean section.   Her family history includes Diabetes in her brother and mother.She reports that she quit smoking about 5 years ago. Her smoking use included cigarettes. She started smoking about 20 years ago. She smoked 0.50 packs per day. She has never used smokeless tobacco. She reports current alcohol use of about 1.0 standard drink of alcohol per week. She reports that she does not use drugs.  Current Outpatient Medications on File Prior to Visit  Medication Sig Dispense Refill  . amLODipine (NORVASC) 10 MG tablet 1 tablet daily 90 tablet 0  . aspirin 81 MG EC tablet Take 1 tablet (81 mg total) by mouth  daily. 30 tablet 0  . atorvastatin (LIPITOR) 80 MG tablet TAKE 1 TABLET EVERY DAY AT 6PM 90 tablet 0  . celecoxib (CELEBREX) 200 MG capsule TAKE 1 CAPSULE EVERY DAY WITH FOOD 90 capsule 0  . clopidogrel (PLAVIX) 75 MG tablet Take 1 tablet (75 mg total) by mouth daily. 90 tablet 0  . metoprolol (TOPROL-XL) 200 MG 24 hr tablet TAKE 1 TABLET ONE TIME DAILY, WITH OR IMMEDIATELY FOLLOWING A MEAL 90 tablet 0  . Multiple Vitamin (MULTIVITAMIN) capsule Take 1 capsule by mouth daily.    . pantoprazole (PROTONIX) 40 MG tablet TAKE 1 TABLET BY MOUTH DAILY FOR STOMACH 90 tablet 0  . traZODone (DESYREL) 150 MG tablet TAKE 1 OR 2 TABLETS AT BEDTIME FOR SLEEP 180 tablet 1  . valsartan-hydrochlorothiazide (DIOVAN-HCT) 320-25 MG tablet TAKE 1 TABLET EVERY DAY 90 tablet 0  . sertraline (ZOLOFT) 50 MG tablet Take 1 tablet (50 mg total) by mouth at bedtime. For anxiety and depression (Patient not taking: Reported on 05/02/2020) 90 tablet 1   No current facility-administered medications on file prior to visit.    ROS Review of Systems  Constitutional: Negative.   HENT: Negative for congestion.   Eyes: Negative for visual disturbance.  Respiratory: Negative for shortness of breath.   Cardiovascular: Negative for chest pain.  Gastrointestinal: Negative for abdominal pain, constipation, diarrhea, nausea and vomiting.  Genitourinary: Negative for difficulty urinating.  Musculoskeletal: Negative for arthralgias and myalgias.  Neurological: Negative for headaches.  Psychiatric/Behavioral: Negative for sleep disturbance.    Objective:  BP (!) 152/86   Pulse 73   Temp 97.8 F (36.6 C) (Temporal)   Resp  20   Ht '5\' 2"'  (1.575 m)   Wt 171 lb 4 oz (77.7 kg)   SpO2 97%   BMI 31.32 kg/m   BP Readings from Last 3 Encounters:  05/02/20 (!) 152/86  01/28/20 130/86  10/20/19 128/70    Wt Readings from Last 3 Encounters:  05/02/20 171 lb 4 oz (77.7 kg)  01/28/20 173 lb (78.5 kg)  10/20/19 173 lb 9.6 oz (78.7  kg)     Physical Exam Constitutional:      General: She is not in acute distress.    Appearance: She is well-developed.  HENT:     Head: Normocephalic and atraumatic.  Eyes:     Conjunctiva/sclera: Conjunctivae normal.     Pupils: Pupils are equal, round, and reactive to light.  Neck:     Thyroid: No thyromegaly.  Cardiovascular:     Rate and Rhythm: Normal rate and regular rhythm.     Heart sounds: Normal heart sounds. No murmur heard.   Pulmonary:     Effort: Pulmonary effort is normal. No respiratory distress.     Breath sounds: Normal breath sounds. No wheezing or rales.  Abdominal:     General: Bowel sounds are normal. There is no distension.     Palpations: Abdomen is soft.     Tenderness: There is no abdominal tenderness.  Musculoskeletal:        General: Normal range of motion.     Cervical back: Normal range of motion and neck supple.  Lymphadenopathy:     Cervical: No cervical adenopathy.  Skin:    General: Skin is warm and dry.  Neurological:     Mental Status: She is alert and oriented to person, place, and time.  Psychiatric:        Behavior: Behavior normal.        Thought Content: Thought content normal.        Judgment: Judgment normal.     Diabetic Foot Exam - Simple   Simple Foot Form Visual Inspection No deformities, no ulcerations, no other skin breakdown bilaterally: Yes See comments: Yes Sensation Testing Intact to touch and monofilament testing bilaterally: Yes Pulse Check Posterior Tibialis and Dorsalis pulse intact bilaterally: Yes Comments  There are some significant calluses on the feet.  The plantar surface of the left big toe in particular.  She also has significant onychomycosis of the great toes bilaterally.  This is been trimmed back by her podiatrist.        Assessment & Plan:   Anshu was seen today for medical management of chronic issues.  Diagnoses and all orders for this visit:  Controlled type 2 diabetes mellitus  with complication, without long-term current use of insulin (HCC) -     Bayer DCA Hb A1c Waived -     CBC with Differential/Platelet -     CMP14+EGFR -     Lipid panel  Essential hypertension -     Bayer DCA Hb A1c Waived -     CBC with Differential/Platelet -     CMP14+EGFR -     Lipid panel  Mixed hyperlipidemia -     Bayer DCA Hb A1c Waived -     CBC with Differential/Platelet -     CMP14+EGFR -     Lipid panel  Other orders -     SitaGLIPtin-MetFORMIN HCl (JANUMET XR) [id number] MG TB24; Take 1 tablet by mouth daily. For diabetes   I have discontinued Suetta T. Neas's Flublok Quadrivalent, sitaGLIPtin, and  metFORMIN. I am also having her start on Janumet XR. Additionally, I am having her maintain her multivitamin, aspirin, clopidogrel, sertraline, traZODone, atorvastatin, metoprolol, celecoxib, pantoprazole, valsartan-hydrochlorothiazide, and amLODipine.  Meds ordered this encounter  Medications  . SitaGLIPtin-MetFORMIN HCl (JANUMET XR) [phone] MG TB24    Sig: Take 1 tablet by mouth daily. For diabetes    Dispense:  90 tablet    Refill:  1   Patient has appointment with Dr. Irving Shows to deal with the calluses in the onychomycosis of her feet.  She was encouraged in proper diabetic foot care  Follow-up: Return in about 3 months (around 07/31/2020).  Marilyn Ford, M.D.

## 2020-05-04 ENCOUNTER — Other Ambulatory Visit: Payer: Self-pay

## 2020-05-04 ENCOUNTER — Telehealth: Payer: Self-pay

## 2020-05-04 ENCOUNTER — Ambulatory Visit (INDEPENDENT_AMBULATORY_CARE_PROVIDER_SITE_OTHER): Payer: Medicare HMO

## 2020-05-04 DIAGNOSIS — Z23 Encounter for immunization: Secondary | ICD-10-CM | POA: Diagnosis not present

## 2020-05-04 NOTE — Progress Notes (Signed)
   Covid-19 Vaccination Clinic  Name:  Marilyn Ford    MRN: [id number] DOB: 1961-01-30  05/04/2020  Marilyn Ford was observed post Covid-19 immunization for 15 minutes without incident. She was provided with Vaccine Information Sheet and instruction to access the V-Safe system.   Marilyn Ford was instructed to call 911 with any severe reactions post vaccine: [name] Difficulty breathing  . Swelling of face and throat  . A fast heartbeat  . A bad rash all over body  . Dizziness and weakness   Immunizations Administered    Name Date Dose VIS Date Route   Pfizer COVID-19 Vaccine 05/04/2020  8:29 AM 0.3 mL 02/24/2020 Intramuscular   Manufacturer: [employer], [employer]   Lot: [id number]   NDC: [address]-3

## 2020-05-04 NOTE — Telephone Encounter (Signed)
Waiting on provider to review  

## 2020-05-08 NOTE — Progress Notes (Signed)
Hello Aleasia,  Your lab result is normal and/or stable.Some minor variations that are not significant are commonly marked abnormal, but do not represent any medical problem for you.  Best regards, Claretta Fraise, M.D.

## 2020-06-06 ENCOUNTER — Other Ambulatory Visit: Payer: Self-pay | Admitting: Family Medicine

## 2020-06-06 DIAGNOSIS — K219 Gastro-esophageal reflux disease without esophagitis: Secondary | ICD-10-CM

## 2020-06-13 ENCOUNTER — Ambulatory Visit (INDEPENDENT_AMBULATORY_CARE_PROVIDER_SITE_OTHER): Payer: Medicare HMO | Admitting: Nurse Practitioner

## 2020-06-13 ENCOUNTER — Encounter: Payer: Self-pay | Admitting: Nurse Practitioner

## 2020-06-13 DIAGNOSIS — Z91199 Patient's noncompliance with other medical treatment and regimen due to unspecified reason: Secondary | ICD-10-CM

## 2020-06-13 DIAGNOSIS — Z5329 Procedure and treatment not carried out because of patient's decision for other reasons: Secondary | ICD-10-CM | POA: Insufficient documentation

## 2020-06-13 DIAGNOSIS — E118 Type 2 diabetes mellitus with unspecified complications: Secondary | ICD-10-CM

## 2020-06-13 DIAGNOSIS — I69351 Hemiplegia and hemiparesis following cerebral infarction affecting right dominant side: Secondary | ICD-10-CM

## 2020-06-14 ENCOUNTER — Ambulatory Visit (INDEPENDENT_AMBULATORY_CARE_PROVIDER_SITE_OTHER): Payer: Medicare HMO | Admitting: Nurse Practitioner

## 2020-06-14 DIAGNOSIS — J069 Acute upper respiratory infection, unspecified: Secondary | ICD-10-CM

## 2020-06-14 MED ORDER — DM-GUAIFENESIN ER 30-600 MG PO TB12: 1.0000 | ORAL_TABLET | Freq: Two times a day (BID) | ORAL | 0 refills | Status: DC

## 2020-06-14 NOTE — Assessment & Plan Note (Signed)
Symptoms not well controlled, COVID-19 test ordered results pending, started patient on guaifenesin.  Follow-up with worsening or unresolved symptoms.  Patient verbalized understanding. Rx sent to pharmacy.

## 2020-06-14 NOTE — Progress Notes (Signed)
   Virtual Visit via telephone Note Due to COVID-19 pandemic this visit was conducted virtually. This visit type was conducted due to national recommendations for restrictions regarding the COVID-19 Pandemic (e.g. social distancing, sheltering in place) in an effort to limit this patient's exposure and mitigate transmission in our community. All issues noted in this document were discussed and addressed.  A physical exam was not performed with this format.  I connected with Marilyn Ford on 06/14/20 at 03:15 pm by telephone and verified that I am speaking with the correct person using two identifiers. Marilyn Ford is currently located at home during visit. The provider, Ivy Lynn, NP is located in their office at time of visit.  I discussed the limitations, risks, security and privacy concerns of performing an evaluation and management service by telephone and the availability of in person appointments. I also discussed with the patient that there may be a patient responsible charge related to this service. The patient expressed understanding and agreed to proceed.   History and Present Illness:  URI  This is a recurrent problem. The current episode started in the past 7 days. There has been no fever. Associated symptoms include congestion and coughing. Pertinent negatives include no abdominal pain, chest pain or headaches. She has tried nothing for the symptoms.      Review of Systems  Constitutional: Negative for chills and fever.  HENT: Positive for congestion.   Respiratory: Positive for cough.   Cardiovascular: Negative for chest pain.  Gastrointestinal: Negative for abdominal pain.  Neurological: Negative for headaches.  All other systems reviewed and are negative.    Observations/Objective: Tele visit- patient did not sound to be in distress  Assessment and Plan: Upper respiratory infection with cough and congestion Symptoms not well controlled, COVID-19 test  ordered results pending, started patient on guaifenesin.  Follow-up with worsening or unresolved symptoms.  Patient verbalized understanding. Rx sent to pharmacy.   Follow Up Instructions:  Follow up with unresolved or worsening symptoms    I discussed the assessment and treatment plan with the patient. The patient was provided an opportunity to ask questions and all were answered. The patient agreed with the plan and demonstrated an understanding of the instructions.   The patient was advised to call back or seek an in-person evaluation if the symptoms worsen or if the condition fails to improve as anticipated.  The above assessment and management plan was discussed with the patient. The patient verbalized understanding of and has agreed to the management plan. Patient is aware to call the clinic if symptoms persist or worsen. Patient is aware when to return to the clinic for a follow-up visit. Patient educated on when it is appropriate to go to the emergency department.   Time call ended: 03:25 PM  I provided 10 minutes of non-face-to-face time during this encounter.    Ivy Lynn, NP

## 2020-06-15 LAB — SARS-COV-2, NAA 2 DAY TAT

## 2020-06-15 LAB — NOVEL CORONAVIRUS, NAA: SARS-CoV-2, NAA: NOT DETECTED

## 2020-07-05 ENCOUNTER — Other Ambulatory Visit (INDEPENDENT_AMBULATORY_CARE_PROVIDER_SITE_OTHER): Payer: Self-pay

## 2020-07-05 ENCOUNTER — Encounter (INDEPENDENT_AMBULATORY_CARE_PROVIDER_SITE_OTHER): Payer: Self-pay

## 2020-07-05 ENCOUNTER — Telehealth (INDEPENDENT_AMBULATORY_CARE_PROVIDER_SITE_OTHER): Payer: Self-pay

## 2020-07-05 DIAGNOSIS — Z1211 Encounter for screening for malignant neoplasm of colon: Secondary | ICD-10-CM

## 2020-07-05 MED ORDER — PEG 3350-KCL-NA BICARB-NACL 420 G PO SOLR: 4000.0000 mL | ORAL | 0 refills | Status: DC

## 2020-07-05 NOTE — Telephone Encounter (Signed)
Ok to schedule.  Thanks,  Alysandra Lobue Castaneda Mayorga, MD Gastroenterology and Hepatology [facility]  

## 2020-07-05 NOTE — Telephone Encounter (Signed)
Marilyn Ford, CMA  

## 2020-07-05 NOTE — Telephone Encounter (Signed)
Referring MD/PCP: Stacks   Procedure: Tcs  Reason/Indication:  Screening  Has patient had this procedure before?  no  If so, when, by whom and where?    Is there a family history of colon cancer?  no  Who?  What age when diagnosed?    Is patient diabetic?   yes      Does patient have prosthetic heart valve or mechanical valve?  no  Do you have a pacemaker/defibrillator?  no  Has patient ever had endocarditis/atrial fibrillation? no  Have you had a stroke/heart attack last 6 mths? no  Does patient use oxygen? no  Has patient had joint replacement within last 12 months?  no  Is patient constipated or do they take laxatives? no  Does patient have a history of alcohol/drug use?  no  Is patient on blood thinner such as Coumadin, Plavix and/or Aspirin? yes  Medications: Plavix 75 mg daily, asa 81 mg daily, amlodipine 10mg  daily, atorvastatin 80 mg daily, celecoxib 200mg  daily, Janumet daily, metoprolol 200mg  daily, MVI daily, pantoprazole 40 mg daily,sertraline 50 mg daily, trazodone 150 mg daily, valsartan/hctz 150 mg 1-2 tabs at bedtime,cranberry daily, flax seed oil daily, krill oil daily  Allergies: pcn  Medication Adjustment per Dr Jenetta Downer Hold Plavix 5 days prior and hold diabetic meds the evening prior and morning of procedure  Procedure date & time: 07/19/20 AM

## 2020-07-14 ENCOUNTER — Ambulatory Visit: Payer: Medicare HMO

## 2020-07-18 ENCOUNTER — Other Ambulatory Visit: Payer: Self-pay

## 2020-07-18 ENCOUNTER — Other Ambulatory Visit (HOSPITAL_COMMUNITY)
Admission: RE | Admit: 2020-07-18 | Discharge: 2020-07-18 | Disposition: A | Discharge status: Home / Self Care | Payer: Medicare HMO | Source: Ambulatory Visit | Attending: Gastroenterology | Admitting: Gastroenterology

## 2020-07-18 DIAGNOSIS — Z833 Family history of diabetes mellitus: Secondary | ICD-10-CM | POA: Diagnosis not present

## 2020-07-18 DIAGNOSIS — Z87891 Personal history of nicotine dependence: Secondary | ICD-10-CM | POA: Diagnosis not present

## 2020-07-18 DIAGNOSIS — Z8673 Personal history of transient ischemic attack (TIA), and cerebral infarction without residual deficits: Secondary | ICD-10-CM | POA: Diagnosis not present

## 2020-07-18 DIAGNOSIS — Z1211 Encounter for screening for malignant neoplasm of colon: Secondary | ICD-10-CM | POA: Diagnosis not present

## 2020-07-18 DIAGNOSIS — E119 Type 2 diabetes mellitus without complications: Secondary | ICD-10-CM | POA: Diagnosis not present

## 2020-07-18 DIAGNOSIS — Z7982 Long term (current) use of aspirin: Secondary | ICD-10-CM | POA: Diagnosis not present

## 2020-07-18 DIAGNOSIS — Z01812 Encounter for preprocedural laboratory examination: Secondary | ICD-10-CM | POA: Insufficient documentation

## 2020-07-18 DIAGNOSIS — K635 Polyp of colon: Secondary | ICD-10-CM | POA: Diagnosis not present

## 2020-07-18 DIAGNOSIS — Z7984 Long term (current) use of oral hypoglycemic drugs: Secondary | ICD-10-CM | POA: Diagnosis not present

## 2020-07-18 DIAGNOSIS — Z20822 Contact with and (suspected) exposure to covid-19: Secondary | ICD-10-CM | POA: Diagnosis not present

## 2020-07-18 DIAGNOSIS — Z7902 Long term (current) use of antithrombotics/antiplatelets: Secondary | ICD-10-CM | POA: Diagnosis not present

## 2020-07-18 DIAGNOSIS — K5730 Diverticulosis of large intestine without perforation or abscess without bleeding: Secondary | ICD-10-CM | POA: Diagnosis not present

## 2020-07-18 DIAGNOSIS — Z880 Allergy status to penicillin: Secondary | ICD-10-CM | POA: Diagnosis not present

## 2020-07-18 DIAGNOSIS — Z79899 Other long term (current) drug therapy: Secondary | ICD-10-CM | POA: Diagnosis not present

## 2020-07-18 DIAGNOSIS — I10 Essential (primary) hypertension: Secondary | ICD-10-CM | POA: Diagnosis not present

## 2020-07-18 LAB — BASIC METABOLIC PANEL
Anion gap: 11 (ref 5–15)
BUN: 13 mg/dL (ref 6–20)
CO2: 27 mmol/L (ref 22–32)
Calcium: 9.4 mg/dL (ref 8.9–10.3)
Chloride: 98 mmol/L (ref 98–111)
Creatinine, Ser: 0.92 mg/dL (ref 0.44–1.00)
GFR, Estimated: >60 mL/min (ref >60)
Glucose, Bld: 192 mg/dL — ABNORMAL HIGH (ref 70–99)
Potassium: 4 mmol/L (ref 3.5–5.1)
Sodium: 136 mmol/L (ref 135–145)

## 2020-07-18 LAB — SARS CORONAVIRUS 2 (TAT 6-24 HRS): SARS Coronavirus 2: NEGATIVE

## 2020-07-19 ENCOUNTER — Ambulatory Visit (HOSPITAL_COMMUNITY): Payer: Medicare HMO | Admitting: Anesthesiology

## 2020-07-19 ENCOUNTER — Encounter (HOSPITAL_COMMUNITY): Payer: Self-pay | Admitting: Gastroenterology

## 2020-07-19 ENCOUNTER — Other Ambulatory Visit: Payer: Self-pay

## 2020-07-19 ENCOUNTER — Encounter (HOSPITAL_COMMUNITY)
Admission: RE | Disposition: A | Discharge status: Home / Self Care | Payer: Self-pay | Source: Home / Self Care | Attending: Gastroenterology

## 2020-07-19 ENCOUNTER — Ambulatory Visit (HOSPITAL_COMMUNITY)
Admission: RE | Admit: 2020-07-19 | Discharge: 2020-07-19 | Disposition: A | Discharge status: Home / Self Care | Payer: Medicare HMO | Attending: Gastroenterology | Admitting: Gastroenterology

## 2020-07-19 DIAGNOSIS — Z880 Allergy status to penicillin: Secondary | ICD-10-CM | POA: Diagnosis not present

## 2020-07-19 DIAGNOSIS — I10 Essential (primary) hypertension: Secondary | ICD-10-CM | POA: Insufficient documentation

## 2020-07-19 DIAGNOSIS — K635 Polyp of colon: Secondary | ICD-10-CM | POA: Diagnosis not present

## 2020-07-19 DIAGNOSIS — Z7984 Long term (current) use of oral hypoglycemic drugs: Secondary | ICD-10-CM | POA: Insufficient documentation

## 2020-07-19 DIAGNOSIS — Z1211 Encounter for screening for malignant neoplasm of colon: Secondary | ICD-10-CM | POA: Insufficient documentation

## 2020-07-19 DIAGNOSIS — Z79899 Other long term (current) drug therapy: Secondary | ICD-10-CM | POA: Diagnosis not present

## 2020-07-19 DIAGNOSIS — D125 Benign neoplasm of sigmoid colon: Secondary | ICD-10-CM | POA: Diagnosis not present

## 2020-07-19 DIAGNOSIS — Z7902 Long term (current) use of antithrombotics/antiplatelets: Secondary | ICD-10-CM | POA: Diagnosis not present

## 2020-07-19 DIAGNOSIS — K5730 Diverticulosis of large intestine without perforation or abscess without bleeding: Secondary | ICD-10-CM | POA: Insufficient documentation

## 2020-07-19 DIAGNOSIS — Z01812 Encounter for preprocedural laboratory examination: Secondary | ICD-10-CM | POA: Diagnosis not present

## 2020-07-19 DIAGNOSIS — E119 Type 2 diabetes mellitus without complications: Secondary | ICD-10-CM | POA: Insufficient documentation

## 2020-07-19 DIAGNOSIS — Z87891 Personal history of nicotine dependence: Secondary | ICD-10-CM | POA: Insufficient documentation

## 2020-07-19 DIAGNOSIS — Z8673 Personal history of transient ischemic attack (TIA), and cerebral infarction without residual deficits: Secondary | ICD-10-CM | POA: Diagnosis not present

## 2020-07-19 DIAGNOSIS — Z833 Family history of diabetes mellitus: Secondary | ICD-10-CM | POA: Insufficient documentation

## 2020-07-19 DIAGNOSIS — Z7982 Long term (current) use of aspirin: Secondary | ICD-10-CM | POA: Insufficient documentation

## 2020-07-19 HISTORY — PX: POLYPECTOMY: SHX5525

## 2020-07-19 HISTORY — PX: COLONOSCOPY WITH PROPOFOL: SHX5780

## 2020-07-19 LAB — HM COLONOSCOPY

## 2020-07-19 LAB — GLUCOSE, CAPILLARY: Glucose-Capillary: 176 mg/dL — ABNORMAL HIGH (ref 70–99)

## 2020-07-19 SURGERY — COLONOSCOPY WITH PROPOFOL
Anesthesia: General

## 2020-07-19 MED ORDER — PROPOFOL 10 MG/ML IV BOLUS
INTRAVENOUS | Status: AC
  Filled 2020-07-19: qty 60

## 2020-07-19 MED ORDER — LIDOCAINE HCL (CARDIAC) PF 100 MG/5ML IV SOSY
PREFILLED_SYRINGE | INTRAVENOUS | Status: DC | PRN
  Administered 2020-07-19: 40 mg via INTRATRACHEAL

## 2020-07-19 MED ORDER — PROPOFOL 10 MG/ML IV BOLUS
INTRAVENOUS | Status: DC | PRN
  Administered 2020-07-19 (×2): 50 mg via INTRAVENOUS
  Administered 2020-07-19: 100 mg via INTRAVENOUS
  Administered 2020-07-19 (×7): 50 mg via INTRAVENOUS

## 2020-07-19 MED ORDER — LACTATED RINGERS IV SOLN
INTRAVENOUS | Status: DC
  Administered 2020-07-19: 1000 mL via INTRAVENOUS

## 2020-07-19 MED ORDER — CHLORHEXIDINE GLUCONATE CLOTH 2 % EX PADS: 6.0000 | MEDICATED_PAD | Freq: Once | CUTANEOUS | Status: DC

## 2020-07-19 MED ORDER — PROPOFOL 10 MG/ML IV BOLUS
INTRAVENOUS | Status: AC
  Filled 2020-07-19: qty 20

## 2020-07-19 NOTE — Transfer of Care (Signed)
Immediate Anesthesia Transfer of Care Note  Patient: Marilyn Ford  Procedure(s) Performed: COLONOSCOPY WITH PROPOFOL (N/A ) POLYPECTOMY  Patient Location: Endoscopy Unit  Anesthesia Type:General  Level of Consciousness: awake, alert , oriented and patient cooperative  Airway & Oxygen Therapy: Patient Spontanous Breathing  Post-op Assessment: Report given to RN and Post -op Vital signs reviewed and stable  Post vital signs: Reviewed and stable  Last Vitals:  Vitals Value Taken Time  BP    Temp    Pulse    Resp    SpO2      Last Pain:  Vitals:   07/19/20 0907  TempSrc:   PainSc: 0-No pain      Patients Stated Pain Goal: 10 (71/95/97 4718)  Complications: No complications documented.

## 2020-07-19 NOTE — Anesthesia Preprocedure Evaluation (Signed)
Anesthesia Evaluation  Patient identified by MRN, date of birth, ID band Patient awake    Reviewed: Allergy & Precautions, NPO status , Patient's Chart, lab work & pertinent test results, reviewed documented beta blocker date and time   History of Anesthesia Complications Negative for: history of anesthetic complications  Airway Mallampati: III  TM Distance: >3 FB Neck ROM: Full    Dental  (+) Dental Advisory Given   Pulmonary former smoker,    Pulmonary exam normal breath sounds clear to auscultation       Cardiovascular Exercise Tolerance: Good hypertension, Pt. on medications and Pt. on home beta blockers Normal cardiovascular exam Rhythm:Regular Rate:Normal     Neuro/Psych PSYCHIATRIC DISORDERS Anxiety CVA, Residual Symptoms    GI/Hepatic Bowel prep,GERD  Medicated and Controlled,(+)     substance abuse  alcohol use,   Endo/Other  diabetes, Well Controlled, Type 2, Oral Hypoglycemic Agents  Renal/GU      Musculoskeletal negative musculoskeletal ROS (+)   Abdominal   Peds  Hematology   Anesthesia Other Findings   Reproductive/Obstetrics negative OB ROS                             Anesthesia Physical Anesthesia Plan  ASA: III  Anesthesia Plan: General   Post-op Pain Management:    Induction: Intravenous  PONV Risk Score and Plan: Propofol infusion and TIVA  Airway Management Planned: Nasal Cannula and Natural Airway  Additional Equipment:   Intra-op Plan:   Post-operative Plan:   Informed Consent: I have reviewed the patients History and Physical, chart, labs and discussed the procedure including the risks, benefits and alternatives for the proposed anesthesia with the patient or authorized representative who has indicated his/her understanding and acceptance.     Dental advisory given  Plan Discussed with: CRNA and Surgeon  Anesthesia Plan Comments:          Anesthesia Quick Evaluation

## 2020-07-19 NOTE — Op Note (Signed)
[facility] Patient Name: Marilyn Ford Procedure Date: 07/19/2020 9:00 AM MRN: [id number] Date of Birth: 1960/06/07 Attending MD: Maylon Peppers ,  CSN: [id number] Age: 60 Admit Type: Outpatient Procedure:                Colonoscopy Indications:              Screening for colorectal malignant neoplasm Providers:                Maylon Peppers, Gwenlyn Fudge, RN, [place]                            Risa Grill, Technician Referring MD:              Medicines:                Monitored Anesthesia Care Complications:            No immediate complications. Estimated Blood Loss:     Estimated blood loss: none. Procedure:                Pre-Anesthesia Assessment:                           - Prior to the procedure, a History and Physical                            was performed, and patient medications, allergies                            and sensitivities were reviewed. The patient's                            tolerance of previous anesthesia was reviewed.                           - The risks and benefits of the procedure and the                            sedation options and risks were discussed with the                            patient. All questions were answered and informed                            consent was obtained.                           - ASA Grade Assessment: II - A patient with mild                            systemic disease.                           After obtaining informed consent, the colonoscope                            was passed under direct vision. Throughout the  procedure, the patient's blood pressure, pulse, and                            oxygen saturations were monitored continuously. The                            [id number]-H190DL ([id number]) scope was introduced through                            the anus and advanced to the the cecum, identified                            by appendiceal orifice and ileocecal valve. The                             colonoscopy was somewhat difficult due to                            significant looping. Successful completion of the                            procedure was aided by applying abdominal pressure.                            The patient tolerated the procedure well. The                            quality of the bowel preparation was adequate to                            identify polyps 6 mm and larger in size. Scope                            withdrawal time was 20 minutes. Scope In: 9:13:29 AM Scope Out: 9:50:58 AM Scope Withdrawal Time: 0 hours 25 minutes 46 seconds  Total Procedure Duration: 0 hours 37 minutes 29 seconds  Findings:      The perianal and digital rectal examinations were normal.      Two sessile polyps were found in the sigmoid colon. The polyps were 2 to       5 mm in size. These polyps were removed with a cold snare. Resection and       retrieval were complete.      A few small and large-mouthed diverticula were found in the sigmoid       colon, descending colon and ascending colon.      The retroflexed view of the distal rectum and anal verge was normal and       showed no anal or rectal abnormalities. Impression:               - Two 2 to 5 mm polyps in the sigmoid colon,                            removed with a cold snare. Resected and retrieved.                           -  Diverticulosis in the sigmoid colon, in the                            descending colon and in the ascending colon.                           - The distal rectum and anal verge are normal on                            retroflexion view. Moderate Sedation:      Per Anesthesia Care Recommendation:           - Discharge patient to home (ambulatory).                           - Resume previous diet.                           - Repeat colonoscopy in 5 years for screening                            purposes due to prep quality.                           - Await pathology  results.                           - Resume Plavix (clopidogrel) at prior dose today. Procedure Code(s):        --- Professional ---                           [id number], Colonoscopy, flexible; with removal of                            tumor(s), polyp(s), or other lesion(s) by snare                            technique Diagnosis Code(s):        --- Professional ---                           Z12.11, Encounter for screening for malignant                            neoplasm of colon                           K63.5, Polyp of colon                           K57.30, Diverticulosis of large intestine without                            perforation or abscess without bleeding CPT copyright 2019 American Medical Association. All rights reserved. The codes documented in this report are preliminary and upon coder review may  be revised to meet current compliance requirements. Maylon Peppers,  MD Maylon Peppers,  07/19/2020 10:02:59 AM This report has been signed electronically. Number of Addenda: 0

## 2020-07-19 NOTE — Anesthesia Postprocedure Evaluation (Signed)
Anesthesia Post Note  Patient: BAYLEN DEA  Procedure(s) Performed: COLONOSCOPY WITH PROPOFOL (N/A ) POLYPECTOMY  Patient location during evaluation: Endoscopy Anesthesia Type: General Level of consciousness: awake and alert and oriented Pain management: pain level controlled Vital Signs Assessment: post-procedure vital signs reviewed and stable Respiratory status: spontaneous breathing Cardiovascular status: blood pressure returned to baseline Postop Assessment: no headache, no backache and no apparent nausea or vomiting Anesthetic complications: no   No complications documented.   Last Vitals:  Vitals:   07/19/20 0808  BP: (!) 158/90  Pulse: 76  Resp: 14  Temp: 36.6 C  SpO2: 97%    Last Pain:  Vitals:   07/19/20 0907  TempSrc:   PainSc: 0-No pain                 Tamirra Sienkiewicz

## 2020-07-19 NOTE — Discharge Instructions (Signed)
You are being discharged to home.  Resume your previous diet.  Your physician has recommended a repeat colonoscopy in five years for screening purposes.  We are waiting for your pathology results.  Restart Plavix today   Diverticulosis  Diverticulosis is a condition that develops when small pouches (diverticula) form in the wall of the large intestine (colon). The colon is where water is absorbed and stool (feces) is formed. The pouches form when the inside layer of the colon pushes through weak spots in the outer layers of the colon. You may have a few pouches or many of them. The pouches usually do not cause problems unless they become inflamed or infected. When this happens, the condition is called diverticulitis. What are the causes? The cause of this condition is not known. What increases the risk? The following factors may make you more likely to develop this condition:  Being older than age 70. Your risk for this condition increases with age. Diverticulosis is rare among people younger than age 47. By age 31, many people have it.  Eating a low-fiber diet.  Having frequent constipation.  Being overweight.  Not getting enough exercise.  Smoking.  Taking over-the-counter pain medicines, like aspirin and ibuprofen.  Having a family history of diverticulosis. What are the signs or symptoms? In most people, there are no symptoms of this condition. If you do have symptoms, they may include:  Bloating.  Cramps in the abdomen.  Constipation or diarrhea.  Pain in the lower left side of the abdomen. How is this diagnosed? Because diverticulosis usually has no symptoms, it is most often diagnosed during an exam for other colon problems. The condition may be diagnosed by:  Using a flexible scope to examine the colon (colonoscopy).  Taking an X-ray of the colon after dye has been put into the colon (barium enema).  Having a CT scan. How is this treated? You may not need  treatment for this condition. Your health care provider may recommend treatment to prevent problems. You may need treatment if you have symptoms or if you previously had diverticulitis. Treatment may include:  Eating a high-fiber diet.  Taking a fiber supplement.  Taking a live bacteria supplement (probiotic).  Taking medicine to relax your colon.   Follow these instructions at home: Medicines  Take over-the-counter and prescription medicines only as told by your health care provider.  If told by your health care provider, take a fiber supplement or probiotic. Constipation prevention Your condition may cause constipation. To prevent or treat constipation, you may need to:  Drink enough fluid to keep your urine pale yellow.  Take over-the-counter or prescription medicines.  Eat foods that are high in fiber, such as beans, whole grains, and fresh fruits and vegetables.  Limit foods that are high in fat and processed sugars, such as fried or sweet foods.   General instructions  Try not to strain when you have a bowel movement.  Keep all follow-up visits as told by your health care provider. This is important. Contact a health care provider if you:  Have pain in your abdomen.  Have bloating.  Have cramps.  Have not had a bowel movement in 3 days. Get help right away if:  Your pain gets worse.  Your bloating becomes very bad.  You have a fever or chills, and your symptoms suddenly get worse.  You vomit.  You have bowel movements that are bloody or black.  You have bleeding from your rectum. Summary  Diverticulosis  is a condition that develops when small pouches (diverticula) form in the wall of the large intestine (colon).  You may have a few pouches or many of them.  This condition is most often diagnosed during an exam for other colon problems.  Treatment may include increasing the fiber in your diet, taking supplements, or taking medicines. This information  is not intended to replace advice given to you by your health care provider. Make sure you discuss any questions you have with your health care provider. Document Revised: 11/20/2018 Document Reviewed: 11/20/2018 Elsevier Patient Education  2021 [place].  Colonoscopy, Adult, Care After This sheet gives you information about how to care for yourself after your procedure. Your doctor may also give you more specific instructions. If you have problems or questions, call your doctor. What can I expect after the procedure? After the procedure, it is common to have:  A small amount of blood in your poop (stool) for 24 hours.  Some gas.  Mild cramping or bloating in your belly (abdomen). Follow these instructions at home: Eating and drinking  Drink enough fluid to keep your pee (urine) pale yellow.  Follow instructions from your doctor about what you cannot eat or drink.  Return to your normal diet as told by your doctor. Avoid heavy or fried foods that are hard to digest.   Activity  Rest as told by your doctor.  Do not sit for a long time without moving. Get up to take short walks every 1-2 hours. This is important. Ask for help if you feel weak or unsteady.  Return to your normal activities as told by your doctor. Ask your doctor what activities are safe for you. To help cramping and bloating:  Try walking around.  Put heat on your belly as told by your doctor. Use the heat source that your doctor recommends, such as a moist heat pack or a heating pad. ? Put a towel between your skin and the heat source. ? Leave the heat on for 20-30 minutes. ? Remove the heat if your skin turns bright red. This is very important if you are unable to feel pain, heat, or cold. You may have a greater risk of getting burned.   General instructions  If you were given a medicine to help you relax (sedative) during your procedure, it can affect you for many hours. Do not drive or use machinery until  your doctor says that it is safe.  For the first 24 hours after the procedure: ? Do not sign important documents. ? Do not drink alcohol. ? Do your daily activities more slowly than normal. ? Eat foods that are soft and easy to digest.  Take over-the-counter or prescription medicines only as told by your doctor.  Keep all follow-up visits as told by your doctor. This is important. Contact a doctor if:  You have blood in your poop 2-3 days after the procedure. Get help right away if:  You have more than a small amount of blood in your poop.  You see large clumps of tissue (blood clots) in your poop.  Your belly is swollen.  You feel like you may vomit (nauseous).  You vomit.  You have a fever.  You have belly pain that gets worse, and medicine does not help your pain. Summary  After the procedure, it is common to have a small amount of blood in your poop. You may also have mild cramping and bloating in your belly.  If  you were given a medicine to help you relax (sedative) during your procedure, it can affect you for many hours. Do not drive or use machinery until your doctor says that it is safe.  Get help right away if you have a lot of blood in your poop, feel like you may vomit, have a fever, or have more belly pain. This information is not intended to replace advice given to you by your health care provider. Make sure you discuss any questions you have with your health care provider. Document Revised: 02/27/2019 Document Reviewed: 11/17/2018 Elsevier Patient Education  [place].  Colon Polyps  Colon polyps are tissue growths inside the colon, which is part of the large intestine. They are one of the types of polyps that can grow in the body. A polyp may be a round bump or a mushroom-shaped growth. You could have one polyp or more than one. Most colon polyps are noncancerous (benign). However, some colon polyps can become cancerous over time. Finding and  removing the polyps early can help prevent this. What are the causes? The exact cause of colon polyps is not known. What increases the risk? The following factors may make you more likely to develop this condition:  Having a family history of colorectal cancer or colon polyps.  Being older than 60 years of age.  Being younger than 60 years of age and having a significant family history of colorectal cancer or colon polyps or a genetic condition that puts you at higher risk of getting colon polyps.  Having inflammatory bowel disease, such as ulcerative colitis or Crohn's disease.  Having certain conditions passed from parent to child (hereditary conditions), such as: ? Familial adenomatous polyposis (FAP). ? Lynch syndrome. ? Turcot syndrome. ? Peutz-Jeghers syndrome. ? MUTYH-associated polyposis (MAP).  Being overweight.  Certain lifestyle factors. These include smoking cigarettes, drinking too much alcohol, not getting enough exercise, and eating a diet that is high in fat and red meat and low in fiber.  Having had childhood cancer that was treated with radiation of the abdomen. What are the signs or symptoms? Many times, there are no symptoms. If you have symptoms, they may include:  Blood coming from the rectum during a bowel movement.  Blood in the stool (feces). The blood may be bright red or very dark in color.  Pain in the abdomen.  A change in bowel habits, such as constipation or diarrhea. How is this diagnosed? This condition is diagnosed with a colonoscopy. This is a procedure in which a lighted, flexible scope is inserted into the opening between the buttocks (anus) and then passed into the colon to examine the area. Polyps are sometimes found when a colonoscopy is done as part of routine cancer screening tests. How is this treated? This condition is treated by removing any polyps that are found. Most polyps can be removed during a colonoscopy. Those polyps will  then be tested for cancer. Additional treatment may be needed depending on the results of testing. Follow these instructions at home: Eating and drinking  Eat foods that are high in fiber, such as fruits, vegetables, and whole grains.  Eat foods that are high in calcium and vitamin D, such as milk, cheese, yogurt, eggs, liver, fish, and broccoli.  Limit foods that are high in fat, such as fried foods and desserts.  Limit the amount of red meat, precooked or cured meat, or other processed meat that you eat, such as hot dogs, sausages, bacon, or  meat loaves.  Limit sugary drinks.   Lifestyle  Maintain a healthy weight, or lose weight if recommended by your health care provider.  Exercise every day or as told by your health care provider.  Do not use any products that contain nicotine or tobacco, such as cigarettes, e-cigarettes, and chewing tobacco. If you need help quitting, ask your health care provider.  Do not drink alcohol if: ? Your health care provider tells you not to drink. ? You are pregnant, may be pregnant, or are planning to become pregnant.  If you drink alcohol: ? Limit how much you use to:  0-1 drink a day for women.  0-2 drinks a day for men. ? Know how much alcohol is in your drink. In the [place] one drink equals one 12 oz bottle of beer (355 mL), one 5 oz glass of wine (148 mL), or one 1 oz glass of hard liquor (44 mL). General instructions  Take over-the-counter and prescription medicines only as told by your health care provider.  Keep all follow-up visits. This is important. This includes having regularly scheduled colonoscopies. Talk to your health care provider about when you need a colonoscopy. Contact a health care provider if:  You have new or worsening bleeding during a bowel movement.  You have new or increased blood in your stool.  You have a change in bowel habits.  You lose weight for no known reason. Summary  Colon polyps are tissue  growths inside the colon, which is part of the large intestine. They are one type of polyp that can grow in the body.  Most colon polyps are noncancerous (benign), but some can become cancerous over time.  This condition is diagnosed with a colonoscopy.  This condition is treated by removing any polyps that are found. Most polyps can be removed during a colonoscopy. This information is not intended to replace advice given to you by your health care provider. Make sure you discuss any questions you have with your health care provider. Document Revised: 08/12/2019 Document Reviewed: 08/12/2019 Elsevier Patient Education  2021 [employer].

## 2020-07-19 NOTE — H&P (Signed)
Marilyn Ford is an 60 y.o. female.   Chief Complaint: Colorectal cancer screening  HPI: 60 year old female with past medical history of diabetes, hypertension, stroke on Plavix, coming for screening colonoscopy. The patient has never had a colonoscopy in the past.  The patient denies having any complaints such as melena, hematochezia, abdominal pain or distention, change in her bowel movement consistency or frequency, no changes in her weight recently.  No family history of colorectal cancer.  The patient took her last dose of Plavix 5 days ago.  Past Medical History:  Diagnosis Date  . Diabetes mellitus without complication ([place])   . Hypertension   . Stroke ([place])   . Vaginal Pap smear, abnormal     Past Surgical History:  Procedure Laterality Date  . CESAREAN SECTION    . CHOLECYSTECTOMY      Family History  Problem Relation Age of Onset  . Diabetes Brother   . Diabetes Mother    Social History:  reports that she quit smoking about 5 years ago. Her smoking use included cigarettes. She started smoking about 20 years ago. She smoked 0.50 packs per day. She has never used smokeless tobacco. She reports current alcohol use of about 1.0 standard drink of alcohol per week. She reports that she does not use drugs.  Allergies:  Allergies  Allergen Reactions  . Penicillins Shortness Of Breath    Medications Prior to Admission  Medication Sig Dispense Refill  . amLODipine (NORVASC) 10 MG tablet 1 tablet daily (Patient taking differently: Take 10 mg by mouth daily.) 90 tablet 0  . Apple Cider Vinegar 500 MG TABS Take 500 mg by mouth daily.    [name] aspirin 81 MG EC tablet Take 1 tablet (81 mg total) by mouth daily. 30 tablet 0  . atorvastatin (LIPITOR) 80 MG tablet TAKE 1 TABLET EVERY DAY AT 6PM (Patient taking differently: Take 80 mg by mouth daily. TAKE 1 TABLET EVERY DAY AT 6PM) 90 tablet 0  . Calcium Carb-Cholecalciferol (CALCIUM-VITAMIN D) 600-400 MG-UNIT TABS Take 1 tablet by  mouth daily.    . celecoxib (CELEBREX) 200 MG capsule TAKE 1 CAPSULE EVERY DAY WITH FOOD (Patient taking differently: Take by mouth daily. TAKE 1 CAPSULE EVERY DAY WITH FOOD) 90 capsule 0  . clopidogrel (PLAVIX) 75 MG tablet Take 1 tablet (75 mg total) by mouth daily. 90 tablet 0  . Coenzyme Q10 (COQ10) 100 MG CAPS Take 100 mg by mouth daily.    . Cranberry 425 MG CAPS Take 425 mg by mouth in the morning and at bedtime.    . Flaxseed, Linseed, (FLAXSEED OIL) 1000 MG CAPS Take 1,000 mg by mouth in the morning and at bedtime.    Javier Docker Oil 350 MG CAPS Take 350 mg by mouth daily.    . metoprolol (TOPROL-XL) 200 MG 24 hr tablet TAKE 1 TABLET ONE TIME DAILY, WITH OR IMMEDIATELY FOLLOWING A MEAL (Patient taking differently: Take 200 mg by mouth daily. TAKE 1 TABLET ONE TIME DAILY, WITH OR IMMEDIATELY FOLLOWING A MEAL) 90 tablet 0  . Multiple Vitamin (MULTIVITAMIN) capsule Take 1 capsule by mouth daily.    . pantoprazole (PROTONIX) 40 MG tablet TAKE 1 TABLET BY MOUTH ONCE DAILY FOR STOMACH (Patient taking differently: Take 40 mg by mouth daily.) 90 tablet 0  . polyethylene glycol-electrolytes (TRILYTE) 420 g solution Take 4,000 mLs by mouth as directed. 4000 mL 0  . SitaGLIPtin-MetFORMIN HCl (JANUMET XR) [id number] MG TB24 Take 1 tablet by mouth daily. For  diabetes 90 tablet 1  . traZODone (DESYREL) 150 MG tablet TAKE 1 OR 2 TABLETS AT BEDTIME FOR SLEEP (Patient taking differently: Take 75 mg by mouth at bedtime.) 180 tablet 1  . valsartan-hydrochlorothiazide (DIOVAN-HCT) 320-25 MG tablet TAKE 1 TABLET EVERY DAY (Patient taking differently: Take 1 tablet by mouth daily.) 90 tablet 0  . zinc gluconate 50 MG tablet Take 50 mg by mouth daily.    [name] dextromethorphan-guaiFENesin (MUCINEX DM) 30-600 MG 12hr tablet Take 1 tablet by mouth 2 (two) times daily. (Patient not taking: Reported on 07/11/2020) 30 tablet 0  . sertraline (ZOLOFT) 50 MG tablet Take 1 tablet (50 mg total) by mouth at bedtime. For anxiety and  depression (Patient not taking: No sig reported) 90 tablet 1    Results for orders placed or performed during the hospital encounter of 07/19/20 (from the past 48 hour(s))  Basic metabolic panel     Status: Abnormal   Collection Time: 07/18/20  8:15 AM  Result Value Ref Range   Sodium 136 135 - 145 mmol/L   Potassium 4.0 3.5 - 5.1 mmol/L   Chloride 98 98 - 111 mmol/L   CO2 27 22 - 32 mmol/L   Glucose, Bld 192 (H) 70 - 99 mg/dL    Comment: Glucose reference range applies only to samples taken after fasting for at least 8 hours.   BUN 13 6 - 20 mg/dL   Creatinine, Ser 0.92 0.44 - 1.00 mg/dL   Calcium 9.4 8.9 - 10.3 mg/dL   GFR, Estimated >60 >60 mL/min    Comment: (NOTE) Calculated using the CKD-EPI Creatinine Equation (2021)    Anion gap 11 5 - 15    Comment: Performed at [facility], [address] [place], [place] [address]  Glucose, capillary     Status: Abnormal   Collection Time: 07/19/20  8:10 AM  Result Value Ref Range   Glucose-Capillary 176 (H) 70 - 99 mg/dL    Comment: Glucose reference range applies only to samples taken after fasting for at least 8 hours.   No results found.  Review of Systems  Constitutional: Negative.   HENT: Negative.   Eyes: Negative.   Respiratory: Negative.   Cardiovascular: Negative.   Gastrointestinal: Negative.   Endocrine: Negative.   Genitourinary: Negative.   Musculoskeletal: Negative.   Skin: Negative.   Allergic/Immunologic: Negative.   Neurological: Negative.   Hematological: Negative.   Psychiatric/Behavioral: Negative.     Blood pressure (!) 158/90, pulse 76, temperature 97.8 F (36.6 C), temperature source Oral, resp. rate 14, height 5\' 2"  (1.575 m), weight 76.2 kg, SpO2 97 %. Physical Exam  GENERAL: The patient is AO x3, in no acute distress. HEENT: Head is normocephalic and atraumatic. EOMI are intact. Mouth is well hydrated and without lesions. NECK: Supple. No masses LUNGS: Clear to auscultation. No presence of  rhonchi/wheezing/rales. Adequate chest expansion HEART: RRR, normal s1 and s2. ABDOMEN: Soft, nontender, no guarding, no peritoneal signs, and nondistended. BS +. No masses. EXTREMITIES: Without any cyanosis, clubbing, rash, lesions or edema. NEUROLOGIC: AOx3, no focal motor deficit. SKIN: no jaundice, no rashes  Assessment/Plan 60 year old female with past medical history of diabetes, hypertension, stroke on Plavix, coming for screening colonoscopy. The patient is at average risk for colorectal cancer.  We will proceed with colonoscopy today.  Harvel Quale, MD 07/19/2020, 9:01 AM

## 2020-07-20 ENCOUNTER — Other Ambulatory Visit: Payer: Self-pay | Admitting: Family Medicine

## 2020-07-20 LAB — SURGICAL PATHOLOGY

## 2020-07-21 ENCOUNTER — Encounter (INDEPENDENT_AMBULATORY_CARE_PROVIDER_SITE_OTHER): Payer: Self-pay | Admitting: *Deleted

## 2020-07-26 ENCOUNTER — Encounter (HOSPITAL_COMMUNITY): Payer: Self-pay | Admitting: Gastroenterology

## 2020-08-02 ENCOUNTER — Ambulatory Visit: Payer: Medicare HMO | Admitting: Family Medicine

## 2020-08-11 ENCOUNTER — Other Ambulatory Visit: Payer: Self-pay

## 2020-08-11 ENCOUNTER — Encounter: Payer: Self-pay | Admitting: Family Medicine

## 2020-08-11 ENCOUNTER — Ambulatory Visit (INDEPENDENT_AMBULATORY_CARE_PROVIDER_SITE_OTHER): Payer: Medicare HMO | Admitting: Family Medicine

## 2020-08-11 VITALS — BP 131/75 | HR 81 | Temp 97.6°F | Ht 62.0 in | Wt 172.4 lb

## 2020-08-11 DIAGNOSIS — G4701 Insomnia due to medical condition: Secondary | ICD-10-CM | POA: Diagnosis not present

## 2020-08-11 DIAGNOSIS — E118 Type 2 diabetes mellitus with unspecified complications: Secondary | ICD-10-CM

## 2020-08-11 DIAGNOSIS — K219 Gastro-esophageal reflux disease without esophagitis: Secondary | ICD-10-CM | POA: Diagnosis not present

## 2020-08-11 DIAGNOSIS — I69351 Hemiplegia and hemiparesis following cerebral infarction affecting right dominant side: Secondary | ICD-10-CM | POA: Diagnosis not present

## 2020-08-11 DIAGNOSIS — F411 Generalized anxiety disorder: Secondary | ICD-10-CM | POA: Diagnosis not present

## 2020-08-11 DIAGNOSIS — E782 Mixed hyperlipidemia: Secondary | ICD-10-CM

## 2020-08-11 DIAGNOSIS — I10 Essential (primary) hypertension: Secondary | ICD-10-CM

## 2020-08-11 LAB — BAYER DCA HB A1C WAIVED: HB A1C (BAYER DCA - WAIVED): 6.8 % (ref <7.0)

## 2020-08-11 MED ORDER — PANTOPRAZOLE SODIUM 40 MG PO TBEC: 40.0000 mg | DELAYED_RELEASE_TABLET | Freq: Two times a day (BID) | ORAL | 3 refills | Status: DC

## 2020-08-11 NOTE — Progress Notes (Signed)
Subjective:  Patient ID: Marilyn Ford,  female    DOB: [birth year]  Age: 60 y.o.    CC: Medical Management of Chronic Issues   HPI Marilyn Ford presents for  follow-up of hypertension. Patient has no history of headache chest pain or shortness of breath or recent cough. Patient also denies symptoms of TIA such as numbness weakness lateralizing. Patient denies side effects from medication. States taking it regularly.  Patient also  in for follow-up of elevated cholesterol. Doing well without complaints on current medication. Denies side effects  including myalgia and arthralgia and nausea. Also in today for liver function testing. Currently no chest pain, shortness of breath or other cardiovascular related symptoms noted.  Follow-up of diabetes. Patient does check blood sugar at home. "Readings run pretty good. Not up a lot." Patient denies symptoms such as excessive hunger or urinary frequency, excessive hunger, nausea No significant hypoglycemic spells noted. Medications reviewed. Pt reports taking them regularly. Pt. denies complication/adverse reaction today.   "I stay angry." My son lives with me. Was in prison. Does drugs in my house.   Depression screen [facility] 2/9 08/11/2020 05/02/2020 01/28/2020 10/20/2019 07/02/2019  Decreased Interest 0 0 0 0 3  Down, Depressed, Hopeless 0 1 0 0 3  PHQ - 2 Score 0 1 0 0 6  Altered sleeping - - - - 2  Tired, decreased energy - - - - 0  Change in appetite - - - - -  Feeling bad or failure about yourself  - - - - 0  Trouble concentrating - - - - 1  Moving slowly or fidgety/restless - - - - 1  Suicidal thoughts - - - - 0  PHQ-9 Score - - - - 10  Difficult doing work/chores - - - - Somewhat difficult  Some recent data might be hidden    History Marilyn Ford has a past medical history of Diabetes mellitus without complication ([place]), Hypertension, Stroke ([place]), and Vaginal Pap smear, abnormal.   She has a past surgical history that includes  Cholecystectomy; Cesarean section; Colonoscopy with propofol (N/A, 07/19/2020); and polypectomy (07/19/2020).   Her family history includes Diabetes in her brother and mother.She reports that she quit smoking about 5 years ago. Her smoking use included cigarettes. She started smoking about 20 years ago. She smoked 0.50 packs per day. She has never used smokeless tobacco. She reports current alcohol use of about 1.0 standard drink of alcohol per week. She reports that she does not use drugs.  Current Outpatient Medications on File Prior to Visit  Medication Sig Dispense Refill  . amLODipine (NORVASC) 10 MG tablet 1 tablet daily (Patient taking differently: Take 10 mg by mouth daily.) 90 tablet 0  . Apple Cider Vinegar 500 MG TABS Take 500 mg by mouth daily.    [name] aspirin 81 MG EC tablet Take 1 tablet (81 mg total) by mouth daily. 30 tablet 0  . atorvastatin (LIPITOR) 80 MG tablet TAKE 1 TABLET EVERY DAY AT 6PM (Patient taking differently: Take 80 mg by mouth daily. TAKE 1 TABLET EVERY DAY AT 6PM) 90 tablet 0  . Calcium Carb-Cholecalciferol (CALCIUM-VITAMIN D) 600-400 MG-UNIT TABS Take 1 tablet by mouth daily.    . celecoxib (CELEBREX) 200 MG capsule TAKE 1 CAPSULE EVERY DAY WITH FOOD (Patient taking differently: Take by mouth daily. TAKE 1 CAPSULE EVERY DAY WITH FOOD) 90 capsule 0  . clopidogrel (PLAVIX) 75 MG tablet Take 1 tablet (75 mg total) by mouth daily. [place]  tablet 0  . Coenzyme Q10 (COQ10) 100 MG CAPS Take 100 mg by mouth daily.    . Cranberry 425 MG CAPS Take 425 mg by mouth in the morning and at bedtime.    [name] dextromethorphan-guaiFENesin (MUCINEX DM) 30-600 MG 12hr tablet Take 1 tablet by mouth 2 (two) times daily. 30 tablet 0  . Flaxseed, Linseed, (FLAXSEED OIL) 1000 MG CAPS Take 1,000 mg by mouth in the morning and at bedtime.    Javier Docker Oil 350 MG CAPS Take 350 mg by mouth daily.    . metoprolol (TOPROL-XL) 200 MG 24 hr tablet TAKE 1 TABLET ONE TIME DAILY, WITH OR IMMEDIATELY FOLLOWING A  MEAL (Patient taking differently: Take 200 mg by mouth daily. TAKE 1 TABLET ONE TIME DAILY, WITH OR IMMEDIATELY FOLLOWING A MEAL) 90 tablet 0  . Multiple Vitamin (MULTIVITAMIN) capsule Take 1 capsule by mouth daily.    . sertraline (ZOLOFT) 50 MG tablet Take 1 tablet (50 mg total) by mouth at bedtime. For anxiety and depression 90 tablet 1  . SitaGLIPtin-MetFORMIN HCl (JANUMET XR) [phone] MG TB24 Take 1 tablet by mouth daily. For diabetes 90 tablet 1  . traZODone (DESYREL) 150 MG tablet TAKE 1 OR 2 TABLETS AT BEDTIME FOR SLEEP (Patient taking differently: Take 75 mg by mouth at bedtime.) 180 tablet 1  . valsartan-hydrochlorothiazide (DIOVAN-HCT) 320-25 MG tablet TAKE 1 TABLET EVERY DAY (Patient taking differently: Take 1 tablet by mouth daily.) 90 tablet 0  . zinc gluconate 50 MG tablet Take 50 mg by mouth daily.     No current facility-administered medications on file prior to visit.    ROS Review of Systems  Constitutional: Negative.   HENT: Negative.   Eyes: Negative for visual disturbance.  Respiratory: Negative for shortness of breath.   Cardiovascular: Negative for chest pain.  Gastrointestinal: Negative for abdominal pain.  Musculoskeletal: Negative for arthralgias.  Neurological: Positive for numbness (right leg whenshe walks a lot. ).    Objective:  BP 131/75   Pulse 81   Temp 97.6 F (36.4 C)   Ht '5\' 2"'  (1.575 m)   Wt 172 lb 6.4 oz (78.2 kg)   SpO2 96%   BMI 31.53 kg/m   BP Readings from Last 3 Encounters:  08/11/20 131/75  07/19/20 111/61  05/02/20 (!) 152/86    Wt Readings from Last 3 Encounters:  08/11/20 172 lb 6.4 oz (78.2 kg)  07/19/20 168 lb (76.2 kg)  05/02/20 171 lb 4 oz (77.7 kg)     Physical Exam Constitutional:      General: She is not in acute distress.    Appearance: She is well-developed.  HENT:     Head: Normocephalic and atraumatic.  Eyes:     Conjunctiva/sclera: Conjunctivae normal.     Pupils: Pupils are equal, round, and reactive  to light.  Neck:     Thyroid: No thyromegaly.  Cardiovascular:     Rate and Rhythm: Normal rate and regular rhythm.     Heart sounds: Normal heart sounds. No murmur heard.   Pulmonary:     Effort: Pulmonary effort is normal. No respiratory distress.     Breath sounds: Normal breath sounds. No wheezing or rales.  Abdominal:     General: Bowel sounds are normal. There is no distension.     Palpations: Abdomen is soft.     Tenderness: There is no abdominal tenderness.  Musculoskeletal:        General: Normal range of motion.  Cervical back: Normal range of motion and neck supple.  Lymphadenopathy:     Cervical: No cervical adenopathy.  Skin:    General: Skin is warm and dry.  Neurological:     Mental Status: She is alert and oriented to person, place, and time.  Psychiatric:        Behavior: Behavior normal.        Thought Content: Thought content normal.        Judgment: Judgment normal.     Diabetic Foot Exam - Simple   Simple Foot Form Diabetic Foot exam was performed with the following findings: Yes 08/11/2020  2:30 PM  Visual Inspection See comments: Yes Sensation Testing Intact to touch and monofilament testing bilaterally: Yes Pulse Check Posterior Tibialis and Dorsalis pulse intact bilaterally: Yes Comments Patient has a large onychomycotic right first toenail that she would like to have removed.  It appears to have been ground back some.  There is also callus formation at the medial plantar aspect of each large toe.  She was advised to soak these very gently with pumice and apply lotion daily       Assessment & Plan:   [place] was seen today for medical management of chronic issues.  Diagnoses and all orders for this visit:  Controlled type 2 diabetes mellitus with complication, without long-term current use of insulin (HCC) -     Bayer DCA Hb A1c Waived -     CMP14+EGFR -     CBC with Differential/Platelet  Mixed hyperlipidemia -     Lipid  panel  Essential hypertension -     CMP14+EGFR  Hemiparesis affecting right side as late effect of cerebrovascular accident (CVA) (HCC)  GAD (generalized anxiety disorder)  Gastroesophageal reflux disease without esophagitis -     pantoprazole (PROTONIX) 40 MG tablet; Take 1 tablet (40 mg total) by mouth 2 (two) times daily.  Primary hypertension  Insomnia due to medical condition   I have changed Butch Penny T. [place]'s pantoprazole. I am also having her maintain her multivitamin, aspirin, clopidogrel, sertraline, traZODone, atorvastatin, metoprolol, celecoxib, valsartan-hydrochlorothiazide, amLODipine, Janumet XR, dextromethorphan-guaiFENesin, Flaxseed Oil, Krill Oil, Apple Cider Vinegar, Calcium-Vitamin D, zinc gluconate, CoQ10, and Cranberry.  Meds ordered this encounter  Medications  . pantoprazole (PROTONIX) 40 MG tablet    Sig: Take 1 tablet (40 mg total) by mouth 2 (two) times daily.    Dispense:  180 tablet    Refill:  3     Follow-up: Return in about 3 months (around 11/10/2020).  Claretta Fraise, M.D.

## 2020-08-12 ENCOUNTER — Other Ambulatory Visit: Payer: Self-pay | Admitting: Family Medicine

## 2020-08-12 DIAGNOSIS — I10 Essential (primary) hypertension: Secondary | ICD-10-CM

## 2020-08-12 LAB — CMP14+EGFR
ALT: 35 IU/L — ABNORMAL HIGH (ref 0–32)
AST: 31 IU/L (ref 0–40)
Albumin/Globulin Ratio: 1.6 (ref 1.2–2.2)
Albumin: 4.6 g/dL (ref 3.8–4.9)
Alkaline Phosphatase: 101 IU/L (ref 44–121)
BUN/Creatinine Ratio: 13 (ref 9–23)
BUN: 11 mg/dL (ref 6–24)
Bilirubin Total: 0.4 mg/dL (ref 0.0–1.2)
CO2: 23 mmol/L (ref 20–29)
Calcium: 10.2 mg/dL (ref 8.7–10.2)
Chloride: 97 mmol/L (ref 96–106)
Creatinine, Ser: 0.88 mg/dL (ref 0.57–1.00)
Globulin, Total: 2.9 g/dL (ref 1.5–4.5)
Glucose: 202 mg/dL — ABNORMAL HIGH (ref 65–99)
Potassium: 4.3 mmol/L (ref 3.5–5.2)
Sodium: 139 mmol/L (ref 134–144)
Total Protein: 7.5 g/dL (ref 6.0–8.5)
eGFR: 76 mL/min/{1.73_m2} (ref >59)

## 2020-08-12 LAB — CBC WITH DIFFERENTIAL/PLATELET
Basophils Absolute: 0.1 10*3/uL (ref 0.0–0.2)
Basos: 1 %
EOS (ABSOLUTE): 0.2 10*3/uL (ref 0.0–0.4)
Eos: 2 %
Hematocrit: 42.3 % (ref 34.0–46.6)
Hemoglobin: 14.4 g/dL (ref 11.1–15.9)
Immature Grans (Abs): 0 10*3/uL (ref 0.0–0.1)
Immature Granulocytes: 1 %
Lymphocytes Absolute: 1.7 10*3/uL (ref 0.7–3.1)
Lymphs: 21 %
MCH: 31.3 pg (ref 26.6–33.0)
MCHC: 34 g/dL (ref 31.5–35.7)
MCV: 92 fL (ref 79–97)
Monocytes Absolute: 0.5 10*3/uL (ref 0.1–0.9)
Monocytes: 6 %
Neutrophils Absolute: 5.7 10*3/uL (ref 1.4–7.0)
Neutrophils: 69 %
Platelets: 249 10*3/uL (ref 150–450)
RBC: 4.6 x10E6/uL (ref 3.77–5.28)
RDW: 12.9 % (ref 11.7–15.4)
WBC: 8.2 10*3/uL (ref 3.4–10.8)

## 2020-08-12 LAB — LIPID PANEL
Chol/HDL Ratio: 4 ratio (ref 0.0–4.4)
Cholesterol, Total: 155 mg/dL (ref 100–199)
HDL: 39 mg/dL — ABNORMAL LOW (ref >39)
LDL Chol Calc (NIH): 59 mg/dL (ref 0–99)
Triglycerides: 367 mg/dL — ABNORMAL HIGH (ref 0–149)
VLDL Cholesterol Cal: 57 mg/dL — ABNORMAL HIGH (ref 5–40)

## 2020-08-12 MED ORDER — ICOSAPENT ETHYL 1 G PO CAPS: 2.0000 g | ORAL_CAPSULE | Freq: Two times a day (BID) | ORAL | 5 refills | Status: DC

## 2020-08-16 ENCOUNTER — Ambulatory Visit (INDEPENDENT_AMBULATORY_CARE_PROVIDER_SITE_OTHER): Payer: Medicare HMO

## 2020-08-16 VITALS — Ht 62.0 in | Wt 172.0 lb

## 2020-08-16 DIAGNOSIS — Z91120 Patient's intentional underdosing of medication regimen due to financial hardship: Secondary | ICD-10-CM

## 2020-08-16 DIAGNOSIS — Z5941 Food insecurity: Secondary | ICD-10-CM

## 2020-08-16 DIAGNOSIS — Z0000 Encounter for general adult medical examination without abnormal findings: Secondary | ICD-10-CM

## 2020-08-16 NOTE — Progress Notes (Signed)
Subjective:   Marilyn Ford is a 60 y.o. female who presents for Medicare Annual (Subsequent) preventive examination.  Virtual Visit via Telephone Note  I connected with  Marilyn Ford on 08/16/20 at  4:15 PM EDT by telephone and verified that I am speaking with the correct person using two identifiers.  Location: Patient: Home Provider: [facility] Persons participating in the virtual visit: patient/Nurse Health Advisor   I discussed the limitations, risks, security and privacy concerns of performing an evaluation and management service by telephone and the availability of in person appointments. The patient expressed understanding and agreed to proceed.  Interactive audio and video telecommunications were attempted between this nurse and patient, however failed, due to patient having technical difficulties OR patient did not have access to video capability.  We continued and completed visit with audio only.  Some vital signs may be absent or patient reported.   Benito Lemmerman E Tambria Pfannenstiel, LPN   Review of Systems     Cardiac Risk Factors include: diabetes mellitus;obesity (BMI >30kg/m2);sedentary lifestyle;Other (see comment);dyslipidemia (hx of stroke)     Objective:    Today's Vitals   08/16/20 1621  Weight: 172 lb (78 kg)  Height: 5\' 2"  (1.575 m)   Body mass index is 31.46 kg/m.  Advanced Directives 07/19/2020 01/02/2019 03/12/2015  Does Patient Have a Medical Advance Directive? No No No  Would patient like information on creating a medical advance directive? - No - Patient declined No - patient declined information    Current Medications (verified) Outpatient Encounter Medications as of 08/16/2020  Medication Sig  . amLODipine (NORVASC) 10 MG tablet 1 tablet daily (Patient taking differently: Take 10 mg by mouth daily.)  . Apple Cider Vinegar 500 MG TABS Take 500 mg by mouth daily.  [name] aspirin 81 MG EC tablet Take 1 tablet (81 mg total) by mouth daily.  [name] atorvastatin (LIPITOR)  80 MG tablet TAKE 1 TABLET EVERY DAY AT 6PM (Patient taking differently: Take 80 mg by mouth daily. TAKE 1 TABLET EVERY DAY AT 6PM)  . Calcium Carb-Cholecalciferol (CALCIUM-VITAMIN D) 600-400 MG-UNIT TABS Take 1 tablet by mouth daily.  . celecoxib (CELEBREX) 200 MG capsule TAKE 1 CAPSULE EVERY DAY WITH FOOD (Patient taking differently: Take by mouth daily. TAKE 1 CAPSULE EVERY DAY WITH FOOD)  . clopidogrel (PLAVIX) 75 MG tablet Take 1 tablet (75 mg total) by mouth daily.  . Coenzyme Q10 (COQ10) 100 MG CAPS Take 100 mg by mouth daily.  . Cranberry 425 MG CAPS Take 425 mg by mouth in the morning and at bedtime.  . Flaxseed, Linseed, (FLAXSEED OIL) 1000 MG CAPS Take 1,000 mg by mouth in the morning and at bedtime.  [name] icosapent Ethyl (VASCEPA) 1 g capsule Take 2 capsules (2 g total) by mouth 2 (two) times daily.  Javier Docker Oil 350 MG CAPS Take 350 mg by mouth daily.  . metoprolol (TOPROL-XL) 200 MG 24 hr tablet TAKE 1 TABLET ONE TIME DAILY, WITH OR IMMEDIATELY FOLLOWING A MEAL (Patient taking differently: Take 200 mg by mouth daily. TAKE 1 TABLET ONE TIME DAILY, WITH OR IMMEDIATELY FOLLOWING A MEAL)  . Multiple Vitamin (MULTIVITAMIN) capsule Take 1 capsule by mouth daily.  . pantoprazole (PROTONIX) 40 MG tablet Take 1 tablet (40 mg total) by mouth 2 (two) times daily.  . sertraline (ZOLOFT) 50 MG tablet Take 1 tablet (50 mg total) by mouth at bedtime. For anxiety and depression  . SitaGLIPtin-MetFORMIN HCl (JANUMET XR) [phone] MG TB24 Take 1 tablet  by mouth daily. For diabetes  . traZODone (DESYREL) 150 MG tablet TAKE 1 OR 2 TABLETS AT BEDTIME FOR SLEEP (Patient taking differently: Take 75 mg by mouth at bedtime.)  . valsartan-hydrochlorothiazide (DIOVAN-HCT) 320-25 MG tablet TAKE 1 TABLET EVERY DAY (Patient taking differently: Take 1 tablet by mouth daily.)  . zinc gluconate 50 MG tablet Take 50 mg by mouth daily.  . [DISCONTINUED] dextromethorphan-guaiFENesin (MUCINEX DM) 30-600 MG 12hr tablet Take 1  tablet by mouth 2 (two) times daily. (Patient not taking: Reported on 08/16/2020)   No facility-administered encounter medications on file as of 08/16/2020.    Allergies (verified) Penicillins   History: Past Medical History:  Diagnosis Date  . Diabetes mellitus without complication ([place])   . Hypertension   . Stroke ([place])   . Vaginal Pap smear, abnormal    Past Surgical History:  Procedure Laterality Date  . CESAREAN SECTION    . CHOLECYSTECTOMY    . COLONOSCOPY WITH PROPOFOL N/A 07/19/2020   Procedure: COLONOSCOPY WITH PROPOFOL;  Surgeon: Harvel Quale, MD;  [facility];  Service: Gastroenterology;  Laterality: N/A;  AM  . POLYPECTOMY  07/19/2020   Procedure: POLYPECTOMY;  Surgeon: Harvel Quale, MD;  Location: [facility];  Service: Gastroenterology;;   Family History  Problem Relation Age of Onset  . Diabetes Brother   . Diabetes Mother    Social History   Socioeconomic History  . Marital status: Married    Spouse name: Jeneen Rinks  . Number of children: 2  . Years of education: 10  . Highest education level: 10th grade  Occupational History  . Occupation: disabled  Tobacco Use  . Smoking status: Former Smoker    Packs/day: 0.50    Types: Cigarettes    Start date: 12/08/1999    Quit date: 03/12/2015    Years since quitting: 5.4  . Smokeless tobacco: Never Used  Vaping Use  . Vaping Use: Never used  Substance and Sexual Activity  . Alcohol use: Yes    Alcohol/week: 1.0 standard drink    Types: 1 Glasses of wine per week  . Drug use: No  . Sexual activity: Not Currently    Birth control/protection: Post-menopausal  Other Topics Concern  . Not on file  Social History Narrative   Volunteers at [employer] once a week. She really enjoys getting out of of the house and working there.    Social Determinants of Health   Financial Resource Strain: Medium Risk  . Difficulty of Paying Living Expenses: Somewhat hard  Food  Insecurity: Food Insecurity Present  . Worried About Charity fundraiser in the Last Year: Sometimes true  . Ran Out of Food in the Last Year: Sometimes true  Transportation Needs: No Transportation Needs  . Lack of Transportation (Medical): No  . Lack of Transportation (Non-Medical): No  Physical Activity: Insufficiently Active  . Days of Exercise per Week: 7 days  . Minutes of Exercise per Session: 10 min  Stress: Stress Concern Present  . Feeling of Stress : Rather much  Social Connections: Moderately Isolated  . Frequency of Communication with Friends and Family: Twice a week  . Frequency of Social Gatherings with Friends and Family: Once a week  . Attends Religious Services: Never  . Active Member of Clubs or Organizations: No  . Attends Archivist Meetings: Never  . Marital Status: Married    Tobacco Counseling Counseling given: Not Answered   Clinical Intake:  Pre-visit preparation completed:  Yes  Pain : No/denies pain     BMI - recorded: 31.46 Nutritional Status: BMI > 30  Obese Nutritional Risks: None Diabetes: Yes CBG done?: No Did pt. bring in CBG monitor from home?: No  How often do you need to have someone help you when you read instructions, pamphlets, or other written materials from your doctor or pharmacy?: 1 - Never  Nutrition Risk Assessment:  Has the patient had any N/V/D within the last 2 months?  No  Does the patient have any non-healing wounds?  No  Has the patient had any unintentional weight loss or weight gain?  No   Diabetes:  Is the patient diabetic?  Yes  If diabetic, was a CBG obtained today?  No  Did the patient bring in their glucometer from home?  No  How often do you monitor your CBG's? Daily fasting.   Financial Strains and Diabetes Management:  Are you having any financial strains with the device, your supplies or your medication? Yes .  Does the patient want to be seen by Chronic Care Management for management of  their diabetes?  Yes  Would the patient like to be referred to a Nutritionist or for Diabetic Management?  No   Diabetic Exams:  Diabetic Eye Exam: Completed 11/27/2016. Overdue for diabetic eye exam. Pt has been advised about the importance in completing this exam. She is going to make an appt soon  Diabetic Foot Exam: Completed 01/28/2017. Pt has been advised about the importance in completing this exam. Pt is scheduled for diabetic foot exam on 11/10/2020.    Interpreter Needed?: No  Information entered by :: Areana Kosanke, LPN   Activities of Daily Living In your present state of health, do you have any difficulty performing the following activities: 08/16/2020  Hearing? N  Vision? N  Difficulty concentrating or making decisions? Y  Walking or climbing stairs? Y  Dressing or bathing? N  Doing errands, shopping? N  Preparing Food and eating ? N  Using the Toilet? N  In the past six months, have you accidently leaked urine? N  Do you have problems with loss of bowel control? N  Managing your Medications? N  Managing your Finances? N  Housekeeping or managing your Housekeeping? N  Some recent data might be hidden    Patient Care Team: Claretta Fraise, MD as PCP - General (Family Medicine) Claretta Fraise, MD (Family Medicine)  Indicate any recent Medical Services you may have received from other than [facility] providers in the past year (date may be approximate).     Assessment:   This is a routine wellness examination for Marilyn Ford.  Hearing/Vision screen  Hearing Screening   125Hz  250Hz  500Hz  1000Hz  2000Hz  3000Hz  4000Hz  6000Hz  8000Hz   Right ear:           Left ear:           Comments: Denies hearing difficulty  Vision Screening Comments: Denies vision problems. [facility] exams [facility] in [place]  Dietary issues and exercise activities discussed: Current Exercise Habits: Home exercise routine, Type of exercise: walking, Time (Minutes): 10, Frequency (Times/Week): 7, Weekly  Exercise (Minutes/Week): 70, Intensity: Mild, Exercise limited by: neurologic condition(s) (hx of stroke - right leg goes numb after 10 minutes of walking)  Goals    .  Blood Pressure Management (pt-stated)      Current Barriers:  . Chronic Disease Management support and education needs related to hypertension.  Nurse Case Manager Clinical Goal(s):  [name] Over  the next 30 days, patient will verbalize understanding of plan for blood pressure management.  Interventions:  . Evaluation of current treatment plan related to hypertension and patient's adherence to plan as established by provider. . Reviewed medications with patient and discussed amlodipine, metoprolol, and diovan . Discussed plans with patient for ongoing care management follow up and provided patient with direct contact information for care management team . Advised patient, providing education and rationale, to monitor blood pressure daily and record, calling PCP office [phone] or RN CCM [phone] for findings outside established parameters.  . Reviewed scheduled/upcoming provider appointments including:   Patient Self Care Activities:  . Performs ADL's independently . Performs IADL's independently .   Initial goal documentation     .  Blood Sugar Management (pt-stated)      Current Barriers:  . Chronic Disease Management support and education needs related to diabetes management.  Nurse Case Manager Clinical Goal(s):  [name] Over the next 30 days, patient will verbalize understanding of plan for blood sugar management  Interventions:  . Evaluation of current treatment plan related to diabetes and patient's adherence to plan as established by provider. . Reviewed medications with patient and discussed janumet XR . Discussed plans with patient for ongoing care management follow up and provided patient with direct contact information for care management team . Reviewed scheduled/upcoming provider appointments including:   . Advised patient, providing education and rationale, to check cbg daily and PRN and record, calling [phone]  for findings outside established parameters.    Patient Self Care Activities:  . Performs ADL's independently . Performs IADL's independently .   Initial goal documentation      .  CCM Initial Goal      Current Barriers:  . Chronic Disease Management support, education, and care coordination needs related to HTN and DMII  Clinical Goal(s) related to HTN and DMII:  Over the next 30 days, patient will:  . Work with the care management team to address educational, disease management, and care coordination needs  . Begin self health monitoring activities as directed today . Call provider office for new or worsened signs and symptoms . Call care management team with questions or concerns . Verbalize basic understanding of patient centered plan of care established today  Interventions related to HTN and DMII:  . Evaluation of current treatment plans and patient's adherence to plan as established by provider . Assessed patient understanding of disease states . Assessed patient's education and care coordination needs . Provided basic disease specific education to patient  . Collaborated with appropriate clinical care team members regarding patient needs  Patient Self Care Activities related to HTN and DMII:  . Patient is unable to independently self-manage chronic health conditions  Initial goal documentation     .  DIET - INCREASE WATER INTAKE      Try to drink 6-8 glasses of water daily.  Goals Addressed             This Visit's Progress   . DIET - INCREASE WATER INTAKE   On track    Try to drink 6-8 glasses of water daily.             Depression Screen PHQ 2/9 Scores 08/16/2020 08/11/2020 05/02/2020 01/28/2020 10/20/2019 07/02/2019 01/02/2019  PHQ - 2 Score 4 0 1 0 0 6 2  PHQ- 9 Score 10 - - - - 10 7    Fall Risk Fall Risk  08/16/2020 10/20/2019 01/02/2019  12/27/2017 08/16/2017  Falls  in the past year? 0 0 0 No No  Number falls in past yr: 0 0 0 - -  Injury with Fall? 0 0 0 - -  Risk for fall due to : Impaired balance/gait No Fall Risks - - -  Risk for fall due to: Comment right leg goes numb after walking over 10 minutes - - - -  Follow up Falls prevention discussed Falls evaluation completed Falls prevention discussed - -  Comment - - get rid of all throw rugs, adequate lighting in general walkways and grab bars in the bathroom - -    FALL RISK PREVENTION PERTAINING TO THE HOME:  Any stairs in or around the home? Yes  If so, are there any without handrails? No  Home free of loose throw rugs in walkways, pet beds, electrical cords, etc? Yes  Adequate lighting in your home to reduce risk of falls? Yes   ASSISTIVE DEVICES UTILIZED TO PREVENT FALLS:  Life alert? No  Use of a cane, walker or w/c? No  Grab bars in the bathroom? Yes  Shower chair or bench in shower? Yes  Elevated toilet seat or a handicapped toilet? No   TIMED UP AND GO:  Was the test performed? No . Telephonic visit.  Cognitive Function:     6CIT Screen 08/16/2020 01/02/2019  What Year? 0 points 0 points  What month? 0 points 0 points  What time? 0 points 0 points  Count back from 20 0 points 0 points  Months in reverse 4 points 2 points  Repeat phrase 0 points 0 points  Total Score 4 2    Immunizations Immunization History  Administered Date(s) Administered  . Influenza, Quadrivalent, Recombinant, Inj, Pf 01/18/2019  . Influenza,inj,Quad PF,6+ Mos 03/22/2015, 01/30/2017, 02/04/2018  . Influenza-Unspecified 01/28/2016  . PFIZER(Purple Top)SARS-COV-2 Vaccination 08/26/2019, 08/28/2019, 05/04/2020    TDAP status: Due, Education has been provided regarding the importance of this vaccine. Advised may receive this vaccine at local pharmacy or Health Dept. Aware to provide a copy of the vaccination record if obtained from local pharmacy or Health Dept. Verbalized  acceptance and understanding.  Flu Vaccine status: Up to date  Pneumococcal vaccine status: Due, Education has been provided regarding the importance of this vaccine. Advised may receive this vaccine at local pharmacy or Health Dept. Aware to provide a copy of the vaccination record if obtained from local pharmacy or Health Dept. Verbalized acceptance and understanding.  Covid-19 vaccine status: Completed vaccines  Qualifies for Shingles Vaccine? Yes   Zostavax completed No   Shingrix Completed?: No.    Education has been provided regarding the importance of this vaccine. Patient has been advised to call insurance company to determine out of pocket expense if they have not yet received this vaccine. Advised may also receive vaccine at local pharmacy or Health Dept. Verbalized acceptance and understanding.  Screening Tests Health Maintenance  Topic Date Due  . OPHTHALMOLOGY EXAM  11/27/2017  . PAP SMEAR-Modifier  07/04/2020  . Hepatitis C Screening  09/21/2020 (Originally 03-18-1961)  . HIV Screening  09/21/2020 (Originally 12/30/1975)  . PNEUMOCOCCAL POLYSACCHARIDE VACCINE AGE 79-64 HIGH RISK  10/19/2020 (Originally 12/30/1962)  . INFLUENZA VACCINE  12/05/2020  . HEMOGLOBIN A1C  02/10/2021  . FOOT EXAM  08/11/2021  . MAMMOGRAM  12/29/2021  . TETANUS/TDAP  11/25/2024  . COLONOSCOPY (Pts 45-32yrs Insurance coverage will need to be confirmed)  07/19/2025  . COVID-19 Vaccine  Completed  . HPV [place]  Maintenance  Health Maintenance Due  Topic Date Due  . OPHTHALMOLOGY EXAM  11/27/2017  . PAP SMEAR-Modifier  07/04/2020    Colorectal cancer screening: Type of screening: Colonoscopy. Completed 07/19/2020. Repeat every 5 years  Mammogram status: Completed 12/30/2019. Repeat every year  Bone Density Scan due at age 37  Lung Cancer Screening: (Low Dose CT Chest recommended if Age 73-80 years, 30 pack-year currently smoking OR have quit w/in 15years.) does not qualify.    Additional Screening:  Hepatitis C Screening: does qualify; Needs this drawn with next routine labs.  Vision Screening: Recommended annual ophthalmology exams for early detection of glaucoma and other disorders of the eye. Is the patient up to date with their annual eye exam?  No  Who is the provider or what is the name of the office in which the patient attends annual eye exams? [facility] in [place] If pt is not established with a provider, would they like to be referred to a provider to establish care? No .   Dental Screening: Recommended annual dental exams for proper oral hygiene  Community Resource Referral / Chronic Care Management: CRR required this visit?  Yes   CCM required this visit?  Yes      Plan:     I have personally reviewed and noted the following in the patient's chart:   . Medical and social history . Use of alcohol, tobacco or illicit drugs  . Current medications and supplements . Functional ability and status . Nutritional status . Physical activity . Advanced directives . List of other physicians . Hospitalizations, surgeries, and ER visits in previous 12 months . Vitals . Screenings to include cognitive, depression, and falls . Referrals and appointments  In addition, I have reviewed and discussed with patient certain preventive protocols, quality metrics, and best practice recommendations. A written personalized care plan for preventive services as well as general preventive health recommendations were provided to patient.     Sandrea Hammond, LPN   10/31/348   Nurse Notes: CRR sent as patient says sometimes if she pays for Janumet, she can't get her other medications, so she gets them every other month, and she cannot afford food on her own, but her family does help her sometimes.

## 2020-08-16 NOTE — Patient Instructions (Signed)
Marilyn Ford , Thank you for taking time to come for your Medicare Wellness Visit. I appreciate your ongoing commitment to your health goals. Please review the following plan we discussed and let me know if I can assist you in the future.   Screening recommendations/referrals: Colonoscopy: Done 07/19/20 - Repeat in 5 years Mammogram: Done 12/30/2019 - Repeat every year Bone Density: Due at age 60 Recommended yearly ophthalmology/optometry visit for glaucoma screening and checkup Recommended yearly dental visit for hygiene and checkup  Vaccinations: Influenza vaccine: Done in 2021 at [facility] [place] Pneumococcal vaccine: Due for Prevnar-13 or Prevnar-20 Tdap vaccine: Due Shingles vaccine: Shingrix discussed. Please contact your pharmacy for coverage information.   Covid-19: Complete 08/26/19, 08/28/19, & 05/04/2020  Advanced directives: Advance directive discussed with you today. Even though you declined this today, please call our office should you change your mind, and we can give you the proper paperwork for you to fill out.  Conditions/risks identified: Continue fall prevention, exercising as much as you are able - aim for 30 minutes each day, even if you have to space it out.  Next appointment: Follow up in one year for your annual wellness visit.   Preventive Care 40-64 Years, Female Preventive care refers to lifestyle choices and visits with your health care provider that can promote health and wellness. What does preventive care include?  A yearly physical exam. This is also called an annual well check.  Dental exams once or twice a year.  Routine eye exams. Ask your health care provider how often you should have your eyes checked.  Personal lifestyle choices, including:  Daily care of your teeth and gums.  Regular physical activity.  Eating a healthy diet.  Avoiding tobacco and drug use.  Limiting alcohol use.  Practicing safe sex.  Taking low-dose aspirin daily  starting at age 19.  Taking vitamin and mineral supplements as recommended by your health care provider. What happens during an annual well check? The services and screenings done by your health care provider during your annual well check will depend on your age, overall health, lifestyle risk factors, and family history of disease. Counseling  Your health care provider may ask you questions about your:  Alcohol use.  Tobacco use.  Drug use.  Emotional well-being.  Home and relationship well-being.  Sexual activity.  Eating habits.  Work and work Statistician.  Method of birth control.  Menstrual cycle.  Pregnancy history. Screening  You may have the following tests or measurements:  Height, weight, and BMI.  Blood pressure.  Lipid and cholesterol levels. These may be checked every 5 years, or more frequently if you are over 20 years old.  Skin check.  Lung cancer screening. You may have this screening every year starting at age 4 if you have a 30-pack-year history of smoking and currently smoke or have quit within the past 15 years.  Fecal occult blood test (FOBT) of the stool. You may have this test every year starting at age 76.  Flexible sigmoidoscopy or colonoscopy. You may have a sigmoidoscopy every 5 years or a colonoscopy every 10 years starting at age 59.  Hepatitis C blood test.  Hepatitis B blood test.  Sexually transmitted disease (STD) testing.  Diabetes screening. This is done by checking your blood sugar (glucose) after you have not eaten for a while (fasting). You may have this done every 1-3 years.  Mammogram. This may be done every 1-2 years. Talk to your health care provider about  when you should start having regular mammograms. This may depend on whether you have a family history of breast cancer.  BRCA-related cancer screening. This may be done if you have a family history of breast, ovarian, tubal, or peritoneal cancers.  Pelvic exam and  Pap test. This may be done every 3 years starting at age 77. Starting at age 42, this may be done every 5 years if you have a Pap test in combination with an HPV test.  Bone density scan. This is done to screen for osteoporosis. You may have this scan if you are at high risk for osteoporosis. Discuss your test results, treatment options, and if necessary, the need for more tests with your health care provider. Vaccines  Your health care provider may recommend certain vaccines, such as:  Influenza vaccine. This is recommended every year.  Tetanus, diphtheria, and acellular pertussis (Tdap, Td) vaccine. You may need a Td booster every 10 years.  Zoster vaccine. You may need this after age 17.  Pneumococcal 13-valent conjugate (PCV13) vaccine. You may need this if you have certain conditions and were not previously vaccinated.  Pneumococcal polysaccharide (PPSV23) vaccine. You may need one or two doses if you smoke cigarettes or if you have certain conditions. Talk to your health care provider about which screenings and vaccines you need and how often you need them. This information is not intended to replace advice given to you by your health care provider. Make sure you discuss any questions you have with your health care provider. Document Released: 05/20/2015 Document Revised: 01/11/2016 Document Reviewed: 02/22/2015 Elsevier Interactive Patient Education  2017 [employer] Prevention in the Home Falls can cause injuries. They can happen to people of all ages. There are many things you can do to make your home safe and to help prevent falls. What can I do on the outside of my home?  Regularly fix the edges of walkways and driveways and fix any cracks.  Remove anything that might make you trip as you walk through a door, such as a raised step or threshold.  Trim any bushes or trees on the path to your home.  Use bright outdoor lighting.  Clear any walking paths of  anything that might make someone trip, such as rocks or tools.  Regularly check to see if handrails are loose or broken. Make sure that both sides of any steps have handrails.  Any raised decks and porches should have guardrails on the edges.  Have any leaves, snow, or ice cleared regularly.  Use sand or salt on walking paths during winter.  Clean up any spills in your garage right away. This includes oil or grease spills. What can I do in the bathroom?  Use night lights.  Install grab bars by the toilet and in the tub and shower. Do not use towel bars as grab bars.  Use non-skid mats or decals in the tub or shower.  If you need to sit down in the shower, use a plastic, non-slip stool.  Keep the floor dry. Clean up any water that spills on the floor as soon as it happens.  Remove soap buildup in the tub or shower regularly.  Attach bath mats securely with double-sided non-slip rug tape.  Do not have throw rugs and other things on the floor that can make you trip. What can I do in the bedroom?  Use night lights.  Make sure that you have a light by your bed  that is easy to reach.  Do not use any sheets or blankets that are too big for your bed. They should not hang down onto the floor.  Have a firm chair that has side arms. You can use this for support while you get dressed.  Do not have throw rugs and other things on the floor that can make you trip. What can I do in the kitchen?  Clean up any spills right away.  Avoid walking on wet floors.  Keep items that you use a lot in easy-to-reach places.  If you need to reach something above you, use a strong step stool that has a grab bar.  Keep electrical cords out of the way.  Do not use floor polish or wax that makes floors slippery. If you must use wax, use non-skid floor wax.  Do not have throw rugs and other things on the floor that can make you trip. What can I do with my stairs?  Do not leave any items on the  stairs.  Make sure that there are handrails on both sides of the stairs and use them. Fix handrails that are broken or loose. Make sure that handrails are as long as the stairways.  Check any carpeting to make sure that it is firmly attached to the stairs. Fix any carpet that is loose or worn.  Avoid having throw rugs at the top or bottom of the stairs. If you do have throw rugs, attach them to the floor with carpet tape.  Make sure that you have a light switch at the top of the stairs and the bottom of the stairs. If you do not have them, ask someone to add them for you. What else can I do to help prevent falls?  Wear shoes that:  Do not have high heels.  Have rubber bottoms.  Are comfortable and fit you well.  Are closed at the toe. Do not wear sandals.  If you use a stepladder:  Make sure that it is fully opened. Do not climb a closed stepladder.  Make sure that both sides of the stepladder are locked into place.  Ask someone to hold it for you, if possible.  Clearly mark and make sure that you can see:  Any grab bars or handrails.  First and last steps.  Where the edge of each step is.  Use tools that help you move around (mobility aids) if they are needed. These include:  Canes.  Walkers.  Scooters.  Crutches.  Turn on the lights when you go into a dark area. Replace any light bulbs as soon as they burn out.  Set up your furniture so you have a clear path. Avoid moving your furniture around.  If any of your floors are uneven, fix them.  If there are any pets around you, be aware of where they are.  Review your medicines with your doctor. Some medicines can make you feel dizzy. This can increase your chance of falling. Ask your doctor what other things that you can do to help prevent falls. This information is not intended to replace advice given to you by your health care provider. Make sure you discuss any questions you have with your health care  provider. Document Released: 02/17/2009 Document Revised: 09/29/2015 Document Reviewed: 05/28/2014 Elsevier Interactive Patient Education  2017 [employer].

## 2020-08-18 ENCOUNTER — Other Ambulatory Visit: Payer: Self-pay

## 2020-08-18 DIAGNOSIS — E785 Hyperlipidemia, unspecified: Secondary | ICD-10-CM

## 2020-08-24 ENCOUNTER — Telehealth: Payer: Self-pay | Admitting: *Deleted

## 2020-08-24 NOTE — Telephone Encounter (Signed)
Key: [id number] - PA Case ID: [id number]  Drug Icosapent Ethyl 1GM capsules Form [employer] Electronic PA Form  DRUG NOT COVEREDFORMULARY  [employer]'s covered (formulary) drugs are Vascepa (brand), simvastatin tablet, rosuvastatin tablet, and atorvastatin tablet

## 2020-08-25 ENCOUNTER — Other Ambulatory Visit: Payer: Self-pay | Admitting: Family Medicine

## 2020-08-25 MED ORDER — VASCEPA 1 G PO CAPS: 2.0000 g | ORAL_CAPSULE | Freq: Two times a day (BID) | ORAL | 5 refills | Status: DC

## 2020-08-25 NOTE — Telephone Encounter (Signed)
I sent in vascepa - brand only

## 2020-09-15 ENCOUNTER — Other Ambulatory Visit: Payer: Self-pay | Admitting: Family Medicine

## 2020-09-15 DIAGNOSIS — I69351 Hemiplegia and hemiparesis following cerebral infarction affecting right dominant side: Secondary | ICD-10-CM

## 2020-09-15 DIAGNOSIS — I10 Essential (primary) hypertension: Secondary | ICD-10-CM

## 2020-09-15 DIAGNOSIS — K219 Gastro-esophageal reflux disease without esophagitis: Secondary | ICD-10-CM

## 2020-09-15 DIAGNOSIS — E118 Type 2 diabetes mellitus with unspecified complications: Secondary | ICD-10-CM

## 2020-09-15 DIAGNOSIS — F411 Generalized anxiety disorder: Secondary | ICD-10-CM

## 2020-09-15 DIAGNOSIS — E782 Mixed hyperlipidemia: Secondary | ICD-10-CM

## 2020-09-15 DIAGNOSIS — G4701 Insomnia due to medical condition: Secondary | ICD-10-CM

## 2020-09-16 ENCOUNTER — Other Ambulatory Visit: Payer: Self-pay | Admitting: *Deleted

## 2020-09-16 MED ORDER — JANUMET XR 100-1000 MG PO TB24: 1.0000 | ORAL_TABLET | Freq: Every day | ORAL | 0 refills | Status: DC

## 2020-09-16 MED ORDER — VASCEPA 1 G PO CAPS: 2.0000 g | ORAL_CAPSULE | Freq: Two times a day (BID) | ORAL | 1 refills | Status: DC

## 2020-10-23 ENCOUNTER — Other Ambulatory Visit: Payer: Self-pay | Admitting: Family Medicine

## 2020-10-28 LAB — HM DIABETES EYE EXAM

## 2020-11-10 ENCOUNTER — Ambulatory Visit (INDEPENDENT_AMBULATORY_CARE_PROVIDER_SITE_OTHER): Payer: Medicare HMO | Admitting: Family Medicine

## 2020-11-10 ENCOUNTER — Encounter: Payer: Self-pay | Admitting: Family Medicine

## 2020-11-10 ENCOUNTER — Other Ambulatory Visit: Payer: Self-pay

## 2020-11-10 ENCOUNTER — Telehealth: Payer: Self-pay | Admitting: Family Medicine

## 2020-11-10 VITALS — BP 121/67 | HR 71 | Temp 97.9°F | Ht 62.0 in | Wt 170.0 lb

## 2020-11-10 DIAGNOSIS — I10 Essential (primary) hypertension: Secondary | ICD-10-CM | POA: Diagnosis not present

## 2020-11-10 DIAGNOSIS — E118 Type 2 diabetes mellitus with unspecified complications: Secondary | ICD-10-CM | POA: Diagnosis not present

## 2020-11-10 DIAGNOSIS — K219 Gastro-esophageal reflux disease without esophagitis: Secondary | ICD-10-CM | POA: Diagnosis not present

## 2020-11-10 DIAGNOSIS — I69351 Hemiplegia and hemiparesis following cerebral infarction affecting right dominant side: Secondary | ICD-10-CM | POA: Diagnosis not present

## 2020-11-10 DIAGNOSIS — E782 Mixed hyperlipidemia: Secondary | ICD-10-CM

## 2020-11-10 DIAGNOSIS — F411 Generalized anxiety disorder: Secondary | ICD-10-CM | POA: Diagnosis not present

## 2020-11-10 DIAGNOSIS — G4701 Insomnia due to medical condition: Secondary | ICD-10-CM

## 2020-11-10 LAB — BAYER DCA HB A1C WAIVED: HB A1C (BAYER DCA - WAIVED): 6.7 % (ref <7.0)

## 2020-11-10 MED ORDER — TRAZODONE HCL 150 MG PO TABS: ORAL_TABLET | ORAL | 3 refills | Status: DC

## 2020-11-10 MED ORDER — CLOPIDOGREL BISULFATE 75 MG PO TABS: 75.0000 mg | ORAL_TABLET | Freq: Every day | ORAL | 3 refills | Status: DC

## 2020-11-10 MED ORDER — ATORVASTATIN CALCIUM 80 MG PO TABS: ORAL_TABLET | ORAL | 3 refills | Status: DC

## 2020-11-10 MED ORDER — OZEMPIC (0.25 OR 0.5 MG/DOSE) 2 MG/1.5ML ~~LOC~~ SOPN: 0.5000 mg | PEN_INJECTOR | SUBCUTANEOUS | 2 refills | Status: DC

## 2020-11-10 MED ORDER — VASCEPA 1 G PO CAPS: 2.0000 g | ORAL_CAPSULE | Freq: Two times a day (BID) | ORAL | 3 refills | Status: DC

## 2020-11-10 MED ORDER — DIPHENHYDRAMINE-APAP (SLEEP) 25-500 MG PO TABS: 1.0000 | ORAL_TABLET | Freq: Every evening | ORAL | Status: DC | PRN

## 2020-11-10 MED ORDER — PANTOPRAZOLE SODIUM 40 MG PO TBEC
DELAYED_RELEASE_TABLET | ORAL | 3 refills | Status: DC
Start: 2020-11-10 — End: 2021-08-22

## 2020-11-10 MED ORDER — AMLODIPINE BESYLATE 10 MG PO TABS: ORAL_TABLET | ORAL | 3 refills | Status: DC

## 2020-11-10 MED ORDER — JANUMET XR 100-1000 MG PO TB24: ORAL_TABLET | ORAL | 3 refills | Status: DC

## 2020-11-10 MED ORDER — TRULICITY 0.75 MG/0.5ML ~~LOC~~ SOAJ: 0.7500 mg | SUBCUTANEOUS | 5 refills | Status: DC

## 2020-11-10 MED ORDER — VALSARTAN-HYDROCHLOROTHIAZIDE 320-25 MG PO TABS: 1.0000 | ORAL_TABLET | Freq: Every day | ORAL | 3 refills | Status: DC

## 2020-11-10 MED ORDER — METOPROLOL SUCCINATE ER 200 MG PO TB24
ORAL_TABLET | ORAL | 3 refills | Status: DC
Start: 2020-11-10 — End: 2021-08-22

## 2020-11-10 MED ORDER — KRILL OIL 500 MG PO CAPS: 3.0000 | ORAL_CAPSULE | Freq: Two times a day (BID) | ORAL | Status: AC

## 2020-11-10 MED ORDER — CELECOXIB 200 MG PO CAPS
ORAL_CAPSULE | ORAL | 3 refills | Status: DC
Start: 2020-11-10 — End: 2021-08-22

## 2020-11-10 NOTE — Telephone Encounter (Signed)
Patient aware.

## 2020-11-10 NOTE — Telephone Encounter (Signed)
Pt called stating that she went to the pharmacy to pick up her Rx's and said the Ozempic was going to cost her $238.[name] Pt cannot afford.. Was told by the pharmacist to let Dr Livia Snellen know that Trulicity would be a much cheaper option for pt.  Pt wants to know if Dr Livia Snellen can switch to Trulicity.  Please advise and call patient.

## 2020-11-10 NOTE — Addendum Note (Signed)
Addended by: Claretta Fraise on: 11/10/2020 03:19 PM   Modules accepted: Orders

## 2020-11-10 NOTE — Telephone Encounter (Signed)
Please let the patient know that I sent their prescription to their pharmacy. Thanks, WS 

## 2020-11-10 NOTE — Progress Notes (Addendum)
Subjective:  Patient ID: Marilyn Ford,  female    DOB: [birth year]  Age: 60 y.o.    CC: Medical Management of Chronic Issues   HPI PHYLLISS STREGE presents for  follow-up of hypertension. Patient has no history of headache chest pain or shortness of breath or recent cough. Patient also denies symptoms of TIA such as numbness weakness lateralizing. Patient denies side effects from medication. States taking it regularly.  Patient also  in for follow-up of elevated cholesterol. Doing well without complaints on current medication. Denies side effects  including myalgia and arthralgia and nausea. Also in today for liver function testing. Currently no chest pain, shortness of breath or other cardiovascular related symptoms noted.  Follow-up of diabetes. Patient does check blood sugar at home. Readings run between 100 and 150 Patient denies symptoms such as excessive hunger or urinary frequency, excessive hunger, nausea A few spellsnoted when she had to eat due to low glucose. Medications reviewed. Pt reports taking them regularly. Pt. denies complication/adverse reaction today.   Can't afford Vascepa, Janumet.   Energy poor. Not sleeping well. Feels bad in the morning. Taking trazodone one or two. Goes to her Mom's home to check on her each morning. Takes 20 min, but feels tired already. Goes to lay down for 1-2 hours. Can't get to sleep though. Makes her irritable.   No weakness of right side from CVA noted recently. Right lleg feels heavy sometimes.  History Belinda has a past medical history of Diabetes mellitus without complication ([place]), Hypertension, Stroke ([place]), and Vaginal Pap smear, abnormal.   She has a past surgical history that includes Cholecystectomy; Cesarean section; Colonoscopy with propofol (N/A, 07/19/2020); and polypectomy (07/19/2020).   Her family history includes Diabetes in her brother and mother.She reports that she quit smoking about 5 years ago. Her smoking use  included cigarettes. She started smoking about 20 years ago. She smoked an average of 0.50 packs per day. She has never used smokeless tobacco. She reports current alcohol use of about 1.0 standard drink of alcohol per week. She reports that she does not use drugs.  Current Outpatient Medications on File Prior to Visit  Medication Sig Dispense Refill   Apple Cider Vinegar 500 MG TABS Take 500 mg by mouth daily.     aspirin 81 MG EC tablet Take 1 tablet (81 mg total) by mouth daily. 30 tablet 0   Calcium Carb-Cholecalciferol (CALCIUM-VITAMIN D) 600-400 MG-UNIT TABS Take 1 tablet by mouth daily.     Coenzyme Q10 (COQ10) 100 MG CAPS Take 100 mg by mouth daily.     Cranberry 425 MG CAPS Take 425 mg by mouth in the morning and at bedtime.     Flaxseed, Linseed, (FLAXSEED OIL) 1000 MG CAPS Take 1,000 mg by mouth in the morning and at bedtime.     Multiple Vitamin (MULTIVITAMIN) capsule Take 1 capsule by mouth daily.     zinc gluconate 50 MG tablet Take 50 mg by mouth daily.     sertraline (ZOLOFT) 50 MG tablet TAKE 1 TABLET BY MOUTH AT BEDTIME FOR ANXIETY AND DEPRESSION (Patient not taking: Reported on 11/10/2020) 90 tablet 0   No current facility-administered medications on file prior to visit.    ROS Review of Systems  Objective:  BP 121/67   Pulse 71   Temp 97.9 F (36.6 C)   Ht _0  (1.575 m)   Wt 170 lb (77.1 kg)   SpO2 93%   BMI 31.09 kg/m  BP Readings from Last 3 Encounters:  11/10/20 121/67  08/11/20 131/75  07/19/20 111/61    Wt Readings from Last 3 Encounters:  11/10/20 170 lb (77.1 kg)  08/16/20 172 lb (78 kg)  08/11/20 172 lb 6.4 oz (78.2 kg)     Physical Exam  Diabetic Foot Exam - Simple   No data filed       Assessment & Plan:   Giabella was seen today for medical management of chronic issues.  Diagnoses and all orders for this visit:  Controlled type 2 diabetes mellitus with complication, without long-term current use of insulin (HCC) -     Bayer DCA  Hb A1c Waived -     CBC with Differential/Platelet -     atorvastatin (LIPITOR) 80 MG tablet; TAKE 1 TABLET EVERY DAY AT 6PM -     valsartan-hydrochlorothiazide (DIOVAN-HCT) 320-25 MG tablet; Take 1 tablet by mouth daily.  Essential hypertension -     CMP14+EGFR -     amLODipine (NORVASC) 10 MG tablet; 1 tablet daily -     metoprolol (TOPROL-XL) 200 MG 24 hr tablet; TAKE 1 TABLET ONE TIME DAILY, WITH OR IMMEDIATELY FOLLOWING A MEAL -     valsartan-hydrochlorothiazide (DIOVAN-HCT) 320-25 MG tablet; Take 1 tablet by mouth daily.  Mixed hyperlipidemia -     Lipid panel -     atorvastatin (LIPITOR) 80 MG tablet; TAKE 1 TABLET EVERY DAY AT 6PM  Hemiparesis affecting right side as late effect of cerebrovascular accident (CVA) (HCC) -     celecoxib (CELEBREX) 200 MG capsule; TAKE 1 CAPSULE EVERY DAY WITH FOOD -     clopidogrel (PLAVIX) 75 MG tablet; Take 1 tablet (75 mg total) by mouth daily.  Gastroesophageal reflux disease without esophagitis -     pantoprazole (PROTONIX) 40 MG tablet; TAKE 1 TABLET EVERY DAY FOR STOMACH  GAD (generalized anxiety disorder)  Insomnia due to medical condition -     traZODone (DESYREL) 150 MG tablet; TAKE 1 OR 2 TABLETS AT BEDTIME FOR SLEEP  Other orders -     Discontinue: SitaGLIPtin-MetFORMIN HCl (JANUMET XR) [phone] MG TB24; TAKE 1 TABLET BY MOUTH ONCE DAILY FOR DIABETES -     Discontinue: VASCEPA 1 g capsule; Take 2 capsules (2 g total) by mouth 2 (two) times daily. -     Krill Oil 500 MG CAPS; Take 3 capsules (1,500 mg total) by mouth in the morning and at bedtime. -     diphenhydramine-acetaminophen (TYLENOL PM) 25-500 MG TABS tablet; Take 1 tablet by mouth at bedtime as needed. -     Discontinue: Semaglutide,0.25 or 0.5MG/DOS, (OZEMPIC, 0.25 OR 0.5 MG/DOSE,) 2 MG/1.5ML SOPN; Inject 0.5 mg into the skin once a week. -     Dulaglutide (TRULICITY) 1.09 UE/4.5WU SOPN; Inject 0.75 mg into the skin once a week.  I have discontinued Omaya Nieland. Ginther's  [employer], Vascepa, Janumet XR, Janumet XR, Vascepa, and Ozempic (0.25 or 0.5 MG/DOSE). I have also changed her clopidogrel and valsartan-hydrochlorothiazide. Additionally, I am having her start on [employer], diphenhydramine-acetaminophen, and Trulicity. Lastly, I am having her maintain her multivitamin, aspirin, Flaxseed Oil, Apple Cider Vinegar, Calcium-Vitamin D, zinc gluconate, CoQ10, Cranberry, sertraline, amLODipine, atorvastatin, celecoxib, metoprolol, pantoprazole, and traZODone.  Meds ordered this encounter  Medications   amLODipine (NORVASC) 10 MG tablet    Sig: 1 tablet daily    Dispense:  90 tablet    Refill:  3   atorvastatin (LIPITOR) 80 MG tablet    Sig:  TAKE 1 TABLET EVERY DAY AT 6PM    Dispense:  90 tablet    Refill:  3   celecoxib (CELEBREX) 200 MG capsule    Sig: TAKE 1 CAPSULE EVERY DAY WITH FOOD    Dispense:  90 capsule    Refill:  3   clopidogrel (PLAVIX) 75 MG tablet    Sig: Take 1 tablet (75 mg total) by mouth daily.    Dispense:  90 tablet    Refill:  3   DISCONTD: SitaGLIPtin-MetFORMIN HCl (JANUMET XR) [phone] MG TB24    Sig: TAKE 1 TABLET BY MOUTH ONCE DAILY FOR DIABETES    Dispense:  90 tablet    Refill:  3   metoprolol (TOPROL-XL) 200 MG 24 hr tablet    Sig: TAKE 1 TABLET ONE TIME DAILY, WITH OR IMMEDIATELY FOLLOWING A MEAL    Dispense:  90 tablet    Refill:  3   pantoprazole (PROTONIX) 40 MG tablet    Sig: TAKE 1 TABLET EVERY DAY FOR STOMACH    Dispense:  90 tablet    Refill:  3   traZODone (DESYREL) 150 MG tablet    Sig: TAKE 1 OR 2 TABLETS AT BEDTIME FOR SLEEP    Dispense:  180 tablet    Refill:  3   valsartan-hydrochlorothiazide (DIOVAN-HCT) 320-25 MG tablet    Sig: Take 1 tablet by mouth daily.    Dispense:  90 tablet    Refill:  3   DISCONTD: VASCEPA 1 g capsule    Sig: Take 2 capsules (2 g total) by mouth 2 (two) times daily.    Dispense:  360 capsule    Refill:  3    Brand medically necessary   Krill Oil 500 MG CAPS    Sig: Take 3  capsules (1,500 mg total) by mouth in the morning and at bedtime.   diphenhydramine-acetaminophen (TYLENOL PM) 25-500 MG TABS tablet    Sig: Take 1 tablet by mouth at bedtime as needed.    Dispense:  60 tablet   DISCONTD: Semaglutide,0.25 or 0.5MG/DOS, (OZEMPIC, 0.25 OR 0.5 MG/DOSE,) 2 MG/1.5ML SOPN    Sig: Inject 0.5 mg into the skin once a week.    Dispense:  1.5 mL    Refill:  2   Dulaglutide (TRULICITY) 4.03 KV/4.2VZ SOPN    Sig: Inject 0.75 mg into the skin once a week.    Dispense:  2 mL    Refill:  5     Follow-up: Return in about 3 months (around 02/10/2021).  Claretta Fraise, M.D.

## 2020-11-11 LAB — CMP14+EGFR
ALT: 36 IU/L — ABNORMAL HIGH (ref 0–32)
AST: 30 IU/L (ref 0–40)
Albumin/Globulin Ratio: 1.8 (ref 1.2–2.2)
Albumin: 4.3 g/dL (ref 3.8–4.9)
Alkaline Phosphatase: 80 IU/L (ref 44–121)
BUN/Creatinine Ratio: 12 (ref 9–23)
BUN: 10 mg/dL (ref 6–24)
Bilirubin Total: 0.4 mg/dL (ref 0.0–1.2)
CO2: 25 mmol/L (ref 20–29)
Calcium: 9.8 mg/dL (ref 8.7–10.2)
Chloride: 95 mmol/L — ABNORMAL LOW (ref 96–106)
Creatinine, Ser: 0.86 mg/dL (ref 0.57–1.00)
Globulin, Total: 2.4 g/dL (ref 1.5–4.5)
Glucose: 159 mg/dL — ABNORMAL HIGH (ref 65–99)
Potassium: 4.6 mmol/L (ref 3.5–5.2)
Sodium: 136 mmol/L (ref 134–144)
Total Protein: 6.7 g/dL (ref 6.0–8.5)
eGFR: 78 mL/min/{1.73_m2} (ref >59)

## 2020-11-11 LAB — CBC WITH DIFFERENTIAL/PLATELET
Basophils Absolute: 0.1 10*3/uL (ref 0.0–0.2)
Basos: 1 %
EOS (ABSOLUTE): 0.2 10*3/uL (ref 0.0–0.4)
Eos: 2 %
Hematocrit: 39.4 % (ref 34.0–46.6)
Hemoglobin: 13.6 g/dL (ref 11.1–15.9)
Immature Grans (Abs): 0 10*3/uL (ref 0.0–0.1)
Immature Granulocytes: 0 %
Lymphocytes Absolute: 2 10*3/uL (ref 0.7–3.1)
Lymphs: 21 %
MCH: 31.6 pg (ref 26.6–33.0)
MCHC: 34.5 g/dL (ref 31.5–35.7)
MCV: 92 fL (ref 79–97)
Monocytes Absolute: 0.6 10*3/uL (ref 0.1–0.9)
Monocytes: 6 %
Neutrophils Absolute: 6.6 10*3/uL (ref 1.4–7.0)
Neutrophils: 70 %
Platelets: 240 10*3/uL (ref 150–450)
RBC: 4.3 x10E6/uL (ref 3.77–5.28)
RDW: 12.4 % (ref 11.7–15.4)
WBC: 9.5 10*3/uL (ref 3.4–10.8)

## 2020-11-11 LAB — LIPID PANEL
Chol/HDL Ratio: 3.2 ratio (ref 0.0–4.4)
Cholesterol, Total: 120 mg/dL (ref 100–199)
HDL: 37 mg/dL — ABNORMAL LOW (ref >39)
LDL Chol Calc (NIH): 52 mg/dL (ref 0–99)
Triglycerides: 189 mg/dL — ABNORMAL HIGH (ref 0–149)
VLDL Cholesterol Cal: 31 mg/dL (ref 5–40)

## 2020-11-14 NOTE — Progress Notes (Signed)
Hello Marilyn Ford,  Your lab result is normal and/or stable.Some minor variations that are not significant are commonly marked abnormal, but do not represent any medical problem for you.  Best regards, Claretta Fraise, M.D.

## 2020-11-17 ENCOUNTER — Ambulatory Visit (INDEPENDENT_AMBULATORY_CARE_PROVIDER_SITE_OTHER): Payer: Medicare HMO | Admitting: Pharmacist

## 2020-11-17 DIAGNOSIS — E663 Overweight: Secondary | ICD-10-CM | POA: Diagnosis not present

## 2020-11-17 DIAGNOSIS — E118 Type 2 diabetes mellitus with unspecified complications: Secondary | ICD-10-CM

## 2020-11-25 NOTE — Patient Instructions (Signed)
Visit Information  PATIENT GOALS:  Goals Addressed               This Visit's Progress     Patient Stated     T2DM PHARMD GOAL (pt-stated)        Current Barriers:  Unable to independently afford treatment regimen Unable to maintain control of TD2M/WEIGHT LOSS   Pharmacist Clinical Goal(s):  Over the next 90 days, patient will verbalize ability to afford treatment regimen maintain control of T2DM/WEIGHT LOSS as evidenced by WEIGHT LOSS/CONTINUED [place]  through collaboration with PharmD and provider.   Interventions: 1:1 collaboration with Claretta Fraise, MD regarding development and update of comprehensive plan of care as evidenced by provider attestation and co-signature Inter-disciplinary care team collaboration (see longitudinal plan of care) Comprehensive medication review performed; medication list updated in electronic medical record  Diabetes: Controlled; current treatment:NEW START TRULICITY;  PATIENT WOULD LIKE TO BE ON [place], CARDIO PROTECTION & WEIGHT LOSS WILL INCREASE AS TOLERATED Denies personal and family history of Medullary thyroid cancer (MTC) APPLICATION SUBMITTED FOR [facility] A1C 6.7%, GFR 78 Current glucose readings: fasting glucose: <135, post prandial glucose: <180 Denies hypoglycemic/hyperglycemic symptoms Discussed meal planning options and Plate method for healthy eating Avoid sugary drinks and desserts Incorporate balanced protein, non starchy veggies, 1 serving of carbohydrate with each meal Increase water intake Increase physical activity as able Current exercise: N/A Educated on [employer] patient finances. APPLICATION SUBMITTED FOR THE [facility]  Patient Goals/Self-Care Activities Over the next 90 days, patient will:  - take medications as prescribed WORK TO LOSE WEIGHT AND CONTINUE GLYCEMIC  CONTROL  Follow Up Plan: Telephone follow up appointment with care management team member scheduled for: 1 MONTH         The patient verbalized understanding of instructions, educational materials, and care plan provided today and declined offer to receive copy of patient instructions, educational materials, and care plan.   Telephone follow up appointment with care management team member scheduled for: 1 MONTH  Signature Regina Eck, PharmD, BCPS Clinical Pharmacist, [place]  II Phone [phone]

## 2020-11-25 NOTE — Progress Notes (Signed)
Chronic Care Management Pharmacy Note  11/25/2020 Name:  Marilyn Ford MRN:  [id number] DOB:  28-Jan-1961  Summary: DIABETES MANAGEMENT/WEIGHT LOSS  Recommendations/Changes made from today's visit: Diabetes: Controlled; current treatment:NEW START TRULICITY;  PATIENT WOULD LIKE TO BE ON TRULICITY FOR CONTINUED GLYCEMIC CONTROL, CARDIO PROTECTION & WEIGHT LOSS WILL INCREASE AS TOLERATED Denies personal and family history of Medullary thyroid cancer (MTC) APPLICATION SUBMITTED FOR [facility] A1C 6.7%, GFR 78 Current glucose readings: fasting glucose: <135, post prandial glucose: <180 Denies hypoglycemic/hyperglycemic symptoms Current exercise: N/A Educated on TRULICITY Assessed patient finances. APPLICATION SUBMITTED FOR THE [facility] PATIENT ASSISTANCE PROGRAM  Follow Up Plan: Telephone follow up appointment with care management team member scheduled for: 1 MONTH  Subjective: Marilyn Ford is an 60 y.o. year old female who is a primary patient of Stacks, Cletus Gash, MD.  The CCM team was consulted for assistance with disease management and care coordination needs.    Engaged with patient by telephone for initial visit in response to provider referral for pharmacy case management and/or care coordination services.   Consent to Services:  The patient was given the following information about Chronic Care Management services today, agreed to services, and gave verbal consent: 1. CCM service includes personalized support from designated clinical staff supervised by the primary care provider, including individualized plan of care and coordination with other care providers 2. 24/7 contact phone numbers for assistance for urgent and routine care needs. 3. Service will only be billed when office clinical staff spend 20 minutes or more in a month to coordinate care. 4. Only one practitioner may furnish and bill the service in a calendar month. 5.The patient  may stop CCM services at any time (effective at the end of the month) by phone call to the office staff. 6. The patient will be responsible for cost sharing (co-pay) of up to 20% of the service fee (after annual deductible is met). Patient agreed to services and consent obtained.  Patient Care Team: Claretta Fraise, MD as PCP - General (Family Medicine) Claretta Fraise, MD (Family Medicine)  Recent office visits: 11/04/20  Recent consult visits: [facility] visits: None in previous 6 months  Objective:  Lab Results  Component Value Date   CREATININE 0.86 11/10/2020   CREATININE 0.88 08/11/2020   CREATININE 0.92 07/18/2020    Lab Results  Component Value Date   HGBA1C 6.7 11/10/2020   Last diabetic Eye exam:  Lab Results  Component Value Date/Time   HMDIABEYEEXA No Retinopathy 10/28/2020 12:00 AM    Last diabetic Foot exam: No results found for: HMDIABFOOTEX      Component Value Date/Time   CHOL 120 11/10/2020 0824   TRIG 189 (H) 11/10/2020 0824   HDL 37 (L) 11/10/2020 0824   CHOLHDL 3.2 11/10/2020 0824   CHOLHDL 7.7 03/13/2015 0438   VLDL UNABLE TO CALCULATE IF TRIGLYCERIDE OVER 400 mg/dL 03/13/2015 0438   LDLCALC 52 11/10/2020 0824    Hepatic Function Latest Ref Rng & Units 11/10/2020 08/11/2020 05/02/2020  Total Protein 6.0 - 8.5 g/dL 6.7 7.5 7.0  Albumin 3.8 - 4.9 g/dL 4.3 4.6 4.2  AST 0 - 40 IU/L '30 31 28  ' ALT 0 - 32 IU/L 36(H) 35(H) 27  Alk Phosphatase 44 - 121 IU/L 80 101 89  Total Bilirubin 0.0 - 1.2 mg/dL 0.4 0.4 0.4  Bilirubin, Direct 0.00 - 0.40 mg/dL - - -    No results found for: TSH, FREET4  CBC  Latest Ref Rng & Units 11/10/2020 08/11/2020 05/02/2020  WBC 3.4 - 10.8 x10E3/uL 9.5 8.2 8.0  Hemoglobin 11.1 - 15.9 g/dL 13.6 14.4 14.0  Hematocrit 34.0 - 46.6 % 39.4 42.3 40.7  Platelets 150 - 450 x10E3/uL 240 249 234    No results found for: VD25OH  Clinical ASCVD: No  The ASCVD Risk score Mikey Bussing DC Jr., et al., 2013) failed to calculate for the  following reasons:   The valid total cholesterol range is 130 to 320 mg/dL    Other: (CHADS2VASc if Afib, PHQ9 if depression, MMRC or CAT for COPD, ACT, DEXA)  Social History   Tobacco Use  Smoking Status Former   Packs/day: 0.50   Types: Cigarettes   Start date: 12/08/1999   Quit date: 03/12/2015   Years since quitting: 5.7  Smokeless Tobacco Never   BP Readings from Last 3 Encounters:  11/10/20 121/67  08/11/20 131/75  07/19/20 111/61   Pulse Readings from Last 3 Encounters:  11/10/20 71  08/11/20 81  07/19/20 76   Wt Readings from Last 3 Encounters:  11/10/20 170 lb (77.1 kg)  08/16/20 172 lb (78 kg)  08/11/20 172 lb 6.4 oz (78.2 kg)    Assessment: Review of patient past medical history, allergies, medications, health status, including review of consultants reports, laboratory and other test data, was performed as part of comprehensive evaluation and provision of chronic care management services.   SDOH:  (Social Determinants of Health) assessments and interventions performed:    CCM Care Plan  Allergies  Allergen Reactions   Penicillins Shortness Of Breath    Medications Reviewed Today     Reviewed by Lavera Guise, [facility] (Pharmacist) on 11/25/20 at 1057  Med List Status: <None>   Medication Order Taking? Sig Documenting Provider Last Dose Status Informant  amLODipine (NORVASC) 10 MG tablet [id number]  1 tablet daily Claretta Fraise, MD  Active   Apple Cider Vinegar 500 MG TABS [id number] No Take 500 mg by mouth daily. [provider] Taking Active Self  aspirin 81 MG EC tablet [id number] No Take 1 tablet (81 mg total) by mouth daily. Claretta Fraise, MD Taking Active Self  atorvastatin (LIPITOR) 80 MG tablet [id number]  TAKE 1 TABLET EVERY DAY AT Nelva Bush, MD  Active   Calcium Carb-Cholecalciferol (CALCIUM-VITAMIN D) 600-400 MG-UNIT TABS [id number] No Take 1 tablet by mouth daily. [provider] Taking Active Self  celecoxib (CELEBREX) 200  MG capsule [id number]  TAKE 1 CAPSULE EVERY DAY WITH FOOD Claretta Fraise, MD  Active   clopidogrel (PLAVIX) 75 MG tablet [id number]  Take 1 tablet (75 mg total) by mouth daily. Claretta Fraise, MD  Active   Coenzyme Q10 (COQ10) 100 MG CAPS [id number] No Take 100 mg by mouth daily. [provider] Taking Active Self  Cranberry 425 MG CAPS [id number] No Take 425 mg by mouth in the morning and at bedtime. [provider] Taking Active Self  diphenhydramine-acetaminophen (TYLENOL PM) 25-500 MG TABS tablet [id number]  Take 1 tablet by mouth at bedtime as needed. Claretta Fraise, MD  Active   Dulaglutide (TRULICITY) 0.24 OX/7.3ZH Bonney Aid [id number]  Inject 0.75 mg into the skin once a week. Claretta Fraise, MD  Active   Flaxseed, Linseed, (FLAXSEED OIL) 1000 MG CAPS [id number] No Take 1,000 mg by mouth in the morning and at bedtime. [provider] Taking Active Self  Krill Oil 500 MG CAPS [id number]  Take 3 capsules (1,500 mg total) by mouth in the morning and at  bedtime. Claretta Fraise, MD  Active   metoprolol (TOPROL-XL) 200 MG 24 hr tablet [id number]  TAKE 1 TABLET ONE TIME DAILY, WITH OR IMMEDIATELY FOLLOWING A MEAL Stacks, Warren, MD  Active   Multiple Vitamin (MULTIVITAMIN) capsule [id number] No Take 1 capsule by mouth daily. [provider] Taking Active Self  pantoprazole (PROTONIX) 40 MG tablet [id number]  TAKE 1 TABLET EVERY DAY FOR STOMACH Stacks, Cletus Gash, MD  Active   sertraline (ZOLOFT) 50 MG tablet [id number] No TAKE 1 TABLET BY MOUTH AT BEDTIME FOR ANXIETY AND DEPRESSION  Patient not taking: Reported on 11/10/2020   Claretta Fraise, MD Not Taking Active   traZODone (DESYREL) 150 MG tablet [id number]  TAKE 1 OR 2 TABLETS AT BEDTIME FOR Karrie Doffing, MD  Active   valsartan-hydrochlorothiazide (DIOVAN-HCT) 320-25 MG tablet [id number]  Take 1 tablet by mouth daily. Claretta Fraise, MD  Active   zinc gluconate 50 MG tablet [id number] No Take 50 mg by mouth daily. [provider] Taking Active Self            Patient Active Problem List   Diagnosis Date Noted   Gastroesophageal reflux disease without esophagitis 10/20/2019   GAD (generalized anxiety disorder) 10/20/2019   Insomnia due to medical condition 10/20/2019   Hemiparesis affecting right side as late effect of cerebrovascular accident (CVA) ([place]) 04/22/2015   Diabetes mellitus type II, controlled ([place]) 12/23/2014   Hypertension 12/23/2014    Immunization History  Administered Date(s) Administered   Influenza, Quadrivalent, Recombinant, Inj, Pf 01/18/2019   Influenza,inj,Quad PF,6+ Mos 03/22/2015, 01/30/2017, 02/04/2018   Influenza-Unspecified 01/28/2016   PFIZER(Purple Top)SARS-COV-2 Vaccination 08/26/2019, 08/28/2019, 05/04/2020    Conditions to be addressed/monitored: DMII  Care Plan : PHARMD MEDICATION MANAGEMENT  Updates made by Lavera Guise, [place] since 11/25/2020 12:00 AM     Problem: DISEASE PROGRESSION PREVENTION      Long-Range Goal: T2DM   This Visit's Progress: On track  Priority: High  Note:   Current Barriers:  Unable to independently afford treatment regimen Unable to maintain control of TD2M/WEIGHT LOSS   Pharmacist Clinical Goal(s):  Over the next 90 days, patient will verbalize ability to afford treatment regimen maintain control of T2DM/WEIGHT LOSS as evidenced by WEIGHT LOSS/CONTINUED [place]  through collaboration with PharmD and provider.   Interventions: 1:1 collaboration with Claretta Fraise, MD regarding development and update of comprehensive plan of care as evidenced by provider attestation and co-signature Inter-disciplinary care team collaboration (see longitudinal plan of care) Comprehensive medication review performed; medication list updated in electronic medical record  Diabetes: Controlled; current treatment:NEW START TRULICITY;  PATIENT WOULD LIKE TO BE ON [place], CARDIO PROTECTION & WEIGHT LOSS WILL INCREASE AS TOLERATED Denies personal and family history of Medullary thyroid cancer (MTC) APPLICATION SUBMITTED FOR [facility] A1C 6.7%, GFR 78 Current glucose readings: fasting glucose: <135, post prandial glucose: <180 Denies hypoglycemic/hyperglycemic symptoms Discussed meal planning options and Plate method for healthy eating Avoid sugary drinks and desserts Incorporate balanced protein, non starchy veggies, 1 serving of carbohydrate with each meal Increase water intake Increase physical activity as able Current exercise: N/A Educated on [place] patient finances. APPLICATION SUBMITTED FOR THE [facility] PATIENT ASSISTANCE PROGRAM  Patient Goals/Self-Care Activities Over the next 90 days, patient will:  - take medications as prescribed WORK TO LOSE WEIGHT AND CONTINUE GLYCEMIC CONTROL  Follow Up Plan: Telephone follow up appointment with care management team member scheduled for:  1 MONTH      Medication Assistance: Application for TRULICITY/[facility]  medication assistance program. in process.  Anticipated assistance start date TBD.  See plan of care for additional detail.  Patient's preferred pharmacy is:  [facility] [address], [address] [place] [address] [place] [address] Phone: [phone] Fax: [phone]  Follow Up:  Patient agrees to Care Plan and Follow-up.  Plan: Telephone follow up appointment with care management team member scheduled for:  1 MONTH   Regina Eck, PharmD, BCPS Clinical Pharmacist, [place]  II Phone [phone]

## 2020-12-07 ENCOUNTER — Other Ambulatory Visit: Payer: Self-pay | Admitting: *Deleted

## 2020-12-07 MED ORDER — TRUEPLUS LANCETS 33G MISC: 3 refills | Status: AC

## 2020-12-07 MED ORDER — BD SWAB SINGLE USE REGULAR PADS: MEDICATED_PAD | 3 refills | Status: AC

## 2020-12-07 MED ORDER — TRUE METRIX AIR GLUCOSE METER W/DEVICE KIT: PACK | 0 refills | Status: AC

## 2020-12-07 MED ORDER — TRUE METRIX LEVEL 1 LOW VI SOLN: 0 refills | Status: AC

## 2020-12-07 MED ORDER — TRUE METRIX BLOOD GLUCOSE TEST VI STRP: ORAL_STRIP | 3 refills | Status: AC

## 2020-12-08 ENCOUNTER — Telehealth: Payer: Self-pay | Admitting: Pharmacist

## 2020-12-08 MED ORDER — TRULICITY 0.75 MG/0.5ML ~~LOC~~ SOAJ: 0.7500 mg | SUBCUTANEOUS | 5 refills | Status: DC

## 2020-12-08 NOTE — Telephone Encounter (Signed)
TRULICITY ESCRIBED TO [facility] HAD PATIENT'S DOB INCORRECT  REFAXED PAPERWORK

## 2020-12-09 NOTE — Progress Notes (Signed)
Received notification from [place] regarding approval for TRULICITY 0.'75MG'$ /0.5ML. Patient assistance approved from 11/28/20 to 05/06/21.  Medication will ship to patients home   Phone: [phone]

## 2020-12-14 ENCOUNTER — Telehealth: Payer: Self-pay | Admitting: Family Medicine

## 2020-12-14 NOTE — Telephone Encounter (Signed)
Returned call to patient  Gave # for [facility] to set up trulicity shipment

## 2020-12-21 ENCOUNTER — Other Ambulatory Visit: Payer: Self-pay | Admitting: Family Medicine

## 2020-12-21 ENCOUNTER — Ambulatory Visit (INDEPENDENT_AMBULATORY_CARE_PROVIDER_SITE_OTHER): Payer: Medicare HMO | Admitting: Pharmacist

## 2020-12-21 DIAGNOSIS — E663 Overweight: Secondary | ICD-10-CM

## 2020-12-21 DIAGNOSIS — E118 Type 2 diabetes mellitus with unspecified complications: Secondary | ICD-10-CM | POA: Diagnosis not present

## 2020-12-21 DIAGNOSIS — Z1231 Encounter for screening mammogram for malignant neoplasm of breast: Secondary | ICD-10-CM

## 2020-12-22 NOTE — Patient Instructions (Signed)
Visit Information  PATIENT GOALS:  Goals Addressed               This Visit's Progress     Patient Stated     T2DM PHARMD GOAL (pt-stated)        Current Barriers:  Unable to independently afford treatment regimen Unable to maintain control of TD2M/WEIGHT LOSS   Pharmacist Clinical Goal(s):  Over the next 90 days, patient will verbalize ability to afford treatment regimen maintain control of T2DM/WEIGHT LOSS as evidenced by WEIGHT LOSS/CONTINUED [place]  through collaboration with PharmD and provider.   Interventions: 1:1 collaboration with Claretta Fraise, MD regarding development and update of comprehensive plan of care as evidenced by provider attestation and co-signature Inter-disciplinary care team collaboration (see longitudinal plan of care) Comprehensive medication review performed; medication list updated in electronic medical record  Diabetes: Controlled; current treatment: NEW START TRULICITY 0.'75mg'$  sq weekly;  PATIENT WOULD LIKE TO BE ON [place], CARDIO PROTECTION & WEIGHT LOSS WILL INCREASE AS TOLERATED Denies personal and family history of Medullary thyroid cancer ([place]) Patient approved for [place] (trulicity to ship to patient's home) A1C 6.7%, GFR 78 Current glucose readings: fasting glucose: <135, post prandial glucose: <180 Denies hypoglycemic/hyperglycemic symptoms Discussed meal planning options and Plate method for healthy eating Avoid sugary drinks and desserts Incorporate balanced protein, non starchy veggies, 1 serving of carbohydrate with each meal Increase water intake Increase physical activity as able Current exercise: N/A Educated on TRULICITY Assessed patient finances. Patient approved for [place] (trulicity to ship to patient's home)  Patient Goals/Self-Care Activities Over the next 90 days, patient will:   - take medications as prescribed WORK TO LOSE WEIGHT AND CONTINUE GLYCEMIC CONTROL  Follow Up Plan: Telephone follow up appointment with care management team member scheduled for: 1 MONTH         The patient verbalized understanding of instructions, educational materials, and care plan provided today and declined offer to receive copy of patient instructions, educational materials, and care plan.   Telephone follow up appointment with care management team member scheduled for: 1 month  Regina Eck, PharmD, BCPS Clinical Pharmacist, [place]  II Phone [phone]

## 2020-12-22 NOTE — Progress Notes (Signed)
Chronic Care Management Pharmacy Note  12/21/2020 Name:  IQRA ROTUNDO MRN:  [id number] DOB:  01-Jul-1960  Summary: t2dm   Recommendations/Changes made from today's visit: Diabetes: Controlled; current treatment: NEW START TRULICITY 7.61PJ sq weekly;  PATIENT WOULD LIKE TO BE ON TRULICITY FOR CONTINUED GLYCEMIC CONTROL, CARDIO PROTECTION & WEIGHT LOSS WILL INCREASE AS TOLERATED Denies personal and family history of Medullary thyroid cancer (MTC) Patient approved for [place] (trulicity to ship to patient's home) A1C 6.7%, GFR 78 Current glucose readings: fasting glucose: <135, post prandial glucose: <180 Denies hypoglycemic/hyperglycemic symptoms Discussed meal planning options and Plate method for healthy eating Avoid sugary drinks and desserts Incorporate balanced protein, non starchy veggies, 1 serving of carbohydrate with each meal Increase water intake Increase physical activity as able Current exercise: N/A Educated on TRULICITY Assessed patient finances. Patient approved for [place] (trulicity to ship to patient's home)  Follow Up Plan: Telephone follow up appointment with care management team member scheduled for: 1 MONTH  Subjective: ANNAMARY BUSCHMAN is an 60 y.o. year old female who is a primary patient of Stacks, Cletus Gash, MD.  The CCM team was consulted for assistance with disease management and care coordination needs.    Engaged with patient by telephone for follow up visit in response to provider referral for pharmacy case management and/or care coordination services.   Consent to Services:  The patient was given information about Chronic Care Management services, agreed to services, and gave verbal consent prior to initiation of services.  Please see initial visit note for detailed documentation.   Patient Care Team: Claretta Fraise, MD as PCP - General (Family Medicine) Claretta Fraise, MD  (Family Medicine) Lavera Guise, [facility] as Pharmacist (Family Medicine)  Objective:  Lab Results  Component Value Date   CREATININE 0.86 11/10/2020   CREATININE 0.88 08/11/2020   CREATININE 0.92 07/18/2020    Lab Results  Component Value Date   HGBA1C 6.7 11/10/2020   Last diabetic Eye exam:  Lab Results  Component Value Date/Time   HMDIABEYEEXA No Retinopathy 10/28/2020 12:00 AM    Last diabetic Foot exam: No results found for: HMDIABFOOTEX      Component Value Date/Time   CHOL 120 11/10/2020 0824   TRIG 189 (H) 11/10/2020 0824   HDL 37 (L) 11/10/2020 0824   CHOLHDL 3.2 11/10/2020 0824   CHOLHDL 7.7 03/13/2015 0438   VLDL UNABLE TO CALCULATE IF TRIGLYCERIDE OVER 400 mg/dL 03/13/2015 0438   LDLCALC 52 11/10/2020 0824    Hepatic Function Latest Ref Rng & Units 11/10/2020 08/11/2020 05/02/2020  Total Protein 6.0 - 8.5 g/dL 6.7 7.5 7.0  Albumin 3.8 - 4.9 g/dL 4.3 4.6 4.2  AST 0 - 40 IU/L _0 ALT 0 - 32 IU/L 36(H) 35(H) 27  Alk Phosphatase 44 - 121 IU/L 80 101 89  Total Bilirubin 0.0 - 1.2 mg/dL 0.4 0.4 0.4  Bilirubin, Direct 0.00 - 0.40 mg/dL - - -    No results found for: TSH, FREET4  CBC Latest Ref Rng & Units 11/10/2020 08/11/2020 05/02/2020  WBC 3.4 - 10.8 x10E3/uL 9.5 8.2 8.0  Hemoglobin 11.1 - 15.9 g/dL 13.6 14.4 14.0  Hematocrit 34.0 - 46.6 % 39.4 42.3 40.7  Platelets 150 - 450 x10E3/uL 240 249 234    No results found for: VD25OH  Clinical ASCVD: No  The ASCVD Risk score Mikey Bussing DC Jr., et al., 2013) failed to calculate for the following reasons:  The valid total cholesterol range is 130 to 320 mg/dL    Other: (CHADS2VASc if Afib, PHQ9 if depression, MMRC or CAT for COPD, ACT, DEXA)  Social History   Tobacco Use  Smoking Status Former   Packs/day: 0.50   Types: Cigarettes   Start date: 12/08/1999   Quit date: 03/12/2015   Years since quitting: 5.7  Smokeless Tobacco Never   BP Readings from Last 3 Encounters:  11/10/20 121/67  08/11/20 131/75   07/19/20 111/61   Pulse Readings from Last 3 Encounters:  11/10/20 71  08/11/20 81  07/19/20 76   Wt Readings from Last 3 Encounters:  11/10/20 170 lb (77.1 kg)  08/16/20 172 lb (78 kg)  08/11/20 172 lb 6.4 oz (78.2 kg)    Assessment: Review of patient past medical history, allergies, medications, health status, including review of consultants reports, laboratory and other test data, was performed as part of comprehensive evaluation and provision of chronic care management services.   SDOH:  (Social Determinants of Health) assessments and interventions performed:    CCM Care Plan  Allergies  Allergen Reactions   Penicillins Shortness Of Breath    Medications Reviewed Today     Reviewed by Lavera Guise, [facility] (Pharmacist) on 12/08/20 at 1137  Med List Status: <None>   Medication Order Taking? Sig Documenting Provider Last Dose Status Informant  Alcohol Swabs (B-D SINGLE USE SWABS REGULAR) PADS [id number]  Test BS daily and as needed Dx E11.9 Claretta Fraise, MD  Active   amLODipine (NORVASC) 10 MG tablet [id number]  1 tablet daily Claretta Fraise, MD  Active   Apple Cider Vinegar 500 MG TABS [id number] No Take 500 mg by mouth daily. [provider] Taking Active Self  aspirin 81 MG EC tablet [id number] No Take 1 tablet (81 mg total) by mouth daily. Claretta Fraise, MD Taking Active Self  atorvastatin (LIPITOR) 80 MG tablet [id number]  TAKE 1 TABLET EVERY DAY AT Nelva Bush, MD  Active   Blood Glucose Calibration (TRUE METRIX LEVEL 1) Low SOLN [id number]  Use with glucometer Dx E11.9 Claretta Fraise, MD  Active   Blood Glucose Monitoring Suppl (TRUE METRIX AIR GLUCOSE METER) w/Device KIT [id number]  Test BS daily and as needed Dx E11.9 Claretta Fraise, MD  Active   Calcium Carb-Cholecalciferol (CALCIUM-VITAMIN D) 600-400 MG-UNIT TABS [id number] No Take 1 tablet by mouth daily. [provider] Taking Active Self  celecoxib (CELEBREX) 200 MG capsule [id number]  TAKE 1  CAPSULE EVERY DAY WITH FOOD Claretta Fraise, MD  Active   clopidogrel (PLAVIX) 75 MG tablet [id number]  Take 1 tablet (75 mg total) by mouth daily. Claretta Fraise, MD  Active   Coenzyme Q10 (COQ10) 100 MG CAPS [id number] No Take 100 mg by mouth daily. [provider] Taking Active Self  Cranberry 425 MG CAPS [id number] No Take 425 mg by mouth in the morning and at bedtime. [provider] Taking Active Self  diphenhydramine-acetaminophen (TYLENOL PM) 25-500 MG TABS tablet [id number]  Take 1 tablet by mouth at bedtime as needed. Claretta Fraise, MD  Active   Dulaglutide (TRULICITY) 7.68 TL/5.7WI Bonney Aid [id number]  Inject 0.75 mg into the skin once a week. Claretta Fraise, MD  Active   Flaxseed, Linseed, (FLAXSEED OIL) 1000 MG CAPS [id number] No Take 1,000 mg by mouth in the morning and at bedtime. [provider] Taking Active Self  glucose blood (TRUE METRIX BLOOD GLUCOSE TEST) test strip [id number]  Test BS daily and as needed Dx E11.9 Stacks,  Cletus Gash, MD  Active   Krill Oil 500 MG CAPS [id number]  Take 3 capsules (1,500 mg total) by mouth in the morning and at bedtime. Claretta Fraise, MD  Active   metoprolol (TOPROL-XL) 200 MG 24 hr tablet [id number]  TAKE 1 TABLET ONE TIME DAILY, WITH OR IMMEDIATELY FOLLOWING A MEAL Stacks, [place], MD  Active   Multiple Vitamin (MULTIVITAMIN) capsule [id number] No Take 1 capsule by mouth daily. [provider] Taking Active Self  pantoprazole (PROTONIX) 40 MG tablet [id number]  TAKE 1 TABLET EVERY DAY FOR STOMACH Stacks, Cletus Gash, MD  Active   sertraline (ZOLOFT) 50 MG tablet [id number] No TAKE 1 TABLET BY MOUTH AT BEDTIME FOR ANXIETY AND DEPRESSION  Patient not taking: Reported on 11/10/2020   Claretta Fraise, MD Not Taking Active   traZODone (DESYREL) 150 MG tablet [id number]  TAKE 1 OR 2 TABLETS AT BEDTIME FOR Karrie Doffing, MD  Active   TRUEplus Lancets 33G MISC [id number]  Test BS daily and as needed Dx E11.9 Claretta Fraise, MD  Active    valsartan-hydrochlorothiazide (DIOVAN-HCT) 320-25 MG tablet [id number]  Take 1 tablet by mouth daily. Claretta Fraise, MD  Active   zinc gluconate 50 MG tablet [id number] No Take 50 mg by mouth daily. [provider] Taking Active Self            Patient Active Problem List   Diagnosis Date Noted   Gastroesophageal reflux disease without esophagitis 10/20/2019   GAD (generalized anxiety disorder) 10/20/2019   Insomnia due to medical condition 10/20/2019   Hemiparesis affecting right side as late effect of cerebrovascular accident (CVA) ([place]) 04/22/2015   Diabetes mellitus type II, controlled ([place]) 12/23/2014   Hypertension 12/23/2014    Immunization History  Administered Date(s) Administered   Influenza, Quadrivalent, Recombinant, Inj, Pf 01/18/2019   Influenza,inj,Quad PF,6+ Mos 03/22/2015, 01/30/2017, 02/04/2018   Influenza-Unspecified 01/28/2016   PFIZER(Purple Top)SARS-COV-2 Vaccination 08/26/2019, 08/28/2019, 05/04/2020    Conditions to be addressed/monitored: DMII  Care Plan : PHARMD MEDICATION MANAGEMENT  Updates made by Lavera Guise, [place] since 12/22/2020 12:00 AM     Problem: DISEASE PROGRESSION PREVENTION      Long-Range Goal: T2DM   Recent Progress: On track  Priority: High  Note:   Current Barriers:  Unable to independently afford treatment regimen Unable to maintain control of TD2M/WEIGHT LOSS   Pharmacist Clinical Goal(s):  Over the next 90 days, patient will verbalize ability to afford treatment regimen maintain control of T2DM/WEIGHT LOSS as evidenced by WEIGHT LOSS/CONTINUED [place]  through collaboration with PharmD and provider.   Interventions: 1:1 collaboration with Claretta Fraise, MD regarding development and update of comprehensive plan of care as evidenced by provider attestation and co-signature Inter-disciplinary care team collaboration (see longitudinal plan of care) Comprehensive medication  review performed; medication list updated in electronic medical record  Diabetes: Controlled; current treatment: NEW START TRULICITY 1.60YO sq weekly;  PATIENT WOULD LIKE TO BE ON [place], CARDIO PROTECTION & WEIGHT LOSS WILL INCREASE AS TOLERATED Denies personal and family history of Medullary thyroid cancer (MTC) Patient approved for [place] (trulicity to ship to patient's home) A1C 6.7%, GFR 78 Current glucose readings: fasting glucose: <135, post prandial glucose: <180 Denies hypoglycemic/hyperglycemic symptoms Discussed meal planning options and Plate method for healthy eating Avoid sugary drinks and desserts Incorporate balanced protein, non starchy veggies, 1 serving of carbohydrate with each meal Increase water intake Increase physical activity as able Current exercise:  N/A Educated on [place] patient finances. Patient approved for [place] (trulicity to ship to patient's home)  Patient Goals/Self-Care Activities Over the next 90 days, patient will:  - take medications as prescribed WORK TO LOSE WEIGHT AND CONTINUE GLYCEMIC CONTROL  Follow Up Plan: Telephone follow up appointment with care management team member scheduled for: 1 MONTH      Medication Assistance:  trulicity obtained through [facility] medication assistance program.  Enrollment ends 05/06/21  Patient's preferred pharmacy is:  [facility] [address], [place] - [address] [place] [address] Phone: [phone] Fax: [phone]  [facility] ([facility]) - [place], [place] [address] Phone: [phone] Fax: [phone]  [id number] [place] [address]Phone: [phone]:  [phone] [facility] [place] [address] [id number]: [phone]: [phone] Follow Up:  Patient agrees to Care Plan and Follow-up.  Plan: Telephone follow up appointment with care management team member scheduled for:  1 month  JRegina Eck PharmD, BCPS Clinical Pharmacist, [place] Phone [phone]

## 2021-01-06 ENCOUNTER — Ambulatory Visit (INDEPENDENT_AMBULATORY_CARE_PROVIDER_SITE_OTHER): Payer: Medicare HMO | Admitting: Pharmacist

## 2021-01-06 DIAGNOSIS — E118 Type 2 diabetes mellitus with unspecified complications: Secondary | ICD-10-CM

## 2021-01-06 MED ORDER — TRULICITY 1.5 MG/0.5ML ~~LOC~~ SOAJ: 1.5000 mg | SUBCUTANEOUS | 4 refills | Status: DC

## 2021-01-06 NOTE — Progress Notes (Signed)
Chronic Care Management Pharmacy Note  01/06/2021 Name:  Marilyn Ford MRN:  [id number] DOB:  Oct 24, 1960  Summary: T2DM  Recommendations/Changes made from today's visit: Diabetes: Controlled; current treatment: TRULICITY 9.74BU sq weekly;  PATIENT WOULD LIKE TO BE ON TRULICITY FOR CONTINUED GLYCEMIC CONTROL, CARDIO PROTECTION & WEIGHT LOSS WILL INCREASE TO 1.5MG SQ WEEKLY NEW DOSE CHANGE FORM SUBMITTED TO [facility] Denies personal and family history of Medullary thyroid cancer (MTC) Patient approved for [place] (trulicity to ship to patient's home) A1C 6.7%, GFR 78 Current glucose readings: fasting glucose: <135, post prandial glucose: <180 Denies hypoglycemic/hyperglycemic symptoms Discussed meal planning options and Plate method for healthy eating Avoid sugary drinks and desserts Incorporate balanced protein, non starchy veggies, 1 serving of carbohydrate with each meal Increase water intake Increase physical activity as able Current exercise: N/A Educated on TRULICITY INCREASE Assessed patient finances. Patient approved for [place] (trulicity to ship to patient's home)  Follow Up Plan: Telephone follow up appointment with care management team member scheduled for: 1 MONTH  Subjective: Marilyn Ford is an 60 y.o. year old female who is a primary patient of Stacks, Cletus Gash, MD.  The CCM team was consulted for assistance with disease management and care coordination needs.    Engaged with patient by telephone for follow up visit in response to provider referral for pharmacy case management and/or care coordination services.   Consent to Services:  The patient was given information about Chronic Care Management services, agreed to services, and gave verbal consent prior to initiation of services.  Please see initial visit note for detailed documentation.   Patient Care Team: Claretta Fraise, MD as PCP -  General (Family Medicine) Claretta Fraise, MD (Family Medicine) Lavera Guise, [facility] as Pharmacist (Family Medicine)  Objective:  Lab Results  Component Value Date   CREATININE 0.86 11/10/2020   CREATININE 0.88 08/11/2020   CREATININE 0.92 07/18/2020    Lab Results  Component Value Date   HGBA1C 6.7 11/10/2020   Last diabetic Eye exam:  Lab Results  Component Value Date/Time   HMDIABEYEEXA No Retinopathy 10/28/2020 12:00 AM    Last diabetic Foot exam: No results found for: HMDIABFOOTEX      Component Value Date/Time   CHOL 120 11/10/2020 0824   TRIG 189 (H) 11/10/2020 0824   HDL 37 (L) 11/10/2020 0824   CHOLHDL 3.2 11/10/2020 0824   CHOLHDL 7.7 03/13/2015 0438   VLDL UNABLE TO CALCULATE IF TRIGLYCERIDE OVER 400 mg/dL 03/13/2015 0438   LDLCALC 52 11/10/2020 0824    Hepatic Function Latest Ref Rng & Units 11/10/2020 08/11/2020 05/02/2020  Total Protein 6.0 - 8.5 g/dL 6.7 7.5 7.0  Albumin 3.8 - 4.9 g/dL 4.3 4.6 4.2  AST 0 - 40 IU/L _0 ALT 0 - 32 IU/L 36(H) 35(H) 27  Alk Phosphatase 44 - 121 IU/L 80 101 89  Total Bilirubin 0.0 - 1.2 mg/dL 0.4 0.4 0.4  Bilirubin, Direct 0.00 - 0.40 mg/dL - - -    No results found for: TSH, FREET4  CBC Latest Ref Rng & Units 11/10/2020 08/11/2020 05/02/2020  WBC 3.4 - 10.8 x10E3/uL 9.5 8.2 8.0  Hemoglobin 11.1 - 15.9 g/dL 13.6 14.4 14.0  Hematocrit 34.0 - 46.6 % 39.4 42.3 40.7  Platelets 150 - 450 x10E3/uL 240 249 234    No results found for: VD25OH  Clinical ASCVD: No  The ASCVD Risk score (Arnett DK, et al., 2019) failed  to calculate for the following reasons:   The valid total cholesterol range is 130 to 320 mg/dL    Other: (CHADS2VASc if Afib, PHQ9 if depression, MMRC or CAT for COPD, ACT, DEXA)  Social History   Tobacco Use  Smoking Status Former   Packs/day: 0.50   Types: Cigarettes   Start date: 12/08/1999   Quit date: 03/12/2015   Years since quitting: 5.8  Smokeless Tobacco Never   BP Readings from Last 3  Encounters:  11/10/20 121/67  08/11/20 131/75  07/19/20 111/61   Pulse Readings from Last 3 Encounters:  11/10/20 71  08/11/20 81  07/19/20 76   Wt Readings from Last 3 Encounters:  11/10/20 170 lb (77.1 kg)  08/16/20 172 lb (78 kg)  08/11/20 172 lb 6.4 oz (78.2 kg)    Assessment: Review of patient past medical history, allergies, medications, health status, including review of consultants reports, laboratory and other test data, was performed as part of comprehensive evaluation and provision of chronic care management services.   SDOH:  (Social Determinants of Health) assessments and interventions performed:    CCM Care Plan  Allergies  Allergen Reactions   Penicillins Shortness Of Breath    Medications Reviewed Today     Reviewed by Lavera Guise, [facility] (Pharmacist) on 01/06/21 at 1354  Med List Status: <None>   Medication Order Taking? Sig Documenting Provider Last Dose Status Informant  Alcohol Swabs (B-D SINGLE USE SWABS REGULAR) PADS [id number]  Test BS daily and as needed Dx E11.9 Claretta Fraise, MD  Active   amLODipine (NORVASC) 10 MG tablet [id number]  1 tablet daily Claretta Fraise, MD  Active   Apple Cider Vinegar 500 MG TABS [id number] No Take 500 mg by mouth daily. [provider] Taking Active Self  aspirin 81 MG EC tablet [id number] No Take 1 tablet (81 mg total) by mouth daily. Claretta Fraise, MD Taking Active Self  atorvastatin (LIPITOR) 80 MG tablet [id number]  TAKE 1 TABLET EVERY DAY AT Nelva Bush, MD  Active   Blood Glucose Calibration (TRUE METRIX LEVEL 1) Low SOLN [id number]  Use with glucometer Dx E11.9 Claretta Fraise, MD  Active   Blood Glucose Monitoring Suppl (TRUE METRIX AIR GLUCOSE METER) w/Device KIT [id number]  Test BS daily and as needed Dx E11.9 Claretta Fraise, MD  Active   Calcium Carb-Cholecalciferol (CALCIUM-VITAMIN D) 600-400 MG-UNIT TABS [id number] No Take 1 tablet by mouth daily. [provider] Taking Active Self   celecoxib (CELEBREX) 200 MG capsule [id number]  TAKE 1 CAPSULE EVERY DAY WITH FOOD Claretta Fraise, MD  Active   clopidogrel (PLAVIX) 75 MG tablet [id number]  Take 1 tablet (75 mg total) by mouth daily. Claretta Fraise, MD  Active   Coenzyme Q10 (COQ10) 100 MG CAPS [id number] No Take 100 mg by mouth daily. [provider] Taking Active Self  Cranberry 425 MG CAPS [id number] No Take 425 mg by mouth in the morning and at bedtime. [provider] Taking Active Self  diphenhydramine-acetaminophen (TYLENOL PM) 25-500 MG TABS tablet [id number]  Take 1 tablet by mouth at bedtime as needed. Claretta Fraise, MD  Active   Dulaglutide (TRULICITY) 4.62 VO/3.5KK Bonney Aid [id number]  Inject 0.75 mg into the skin once a week. Claretta Fraise, MD  Active            Med Note Blanca Friend, Royce Macadamia   Fri Jan 06, 2021  1:54 PM) VIA [employer] PATIENT ASSISTANCE PROGRAM  Flaxseed, Linseed, (FLAXSEED OIL) 1000 MG CAPS [id number] No  Take 1,000 mg by mouth in the morning and at bedtime. [provider] Taking Active Self  glucose blood (TRUE METRIX BLOOD GLUCOSE TEST) test strip [id number]  Test BS daily and as needed Dx E11.9 Claretta Fraise, MD  Active   Krill Oil 500 MG CAPS [id number]  Take 3 capsules (1,500 mg total) by mouth in the morning and at bedtime. Claretta Fraise, MD  Active   metoprolol (TOPROL-XL) 200 MG 24 hr tablet [id number]  TAKE 1 TABLET ONE TIME DAILY, WITH OR IMMEDIATELY FOLLOWING A MEAL Stacks, [place], MD  Active   Multiple Vitamin (MULTIVITAMIN) capsule [id number] No Take 1 capsule by mouth daily. [provider] Taking Active Self  pantoprazole (PROTONIX) 40 MG tablet [id number]  TAKE 1 TABLET EVERY DAY FOR STOMACH Stacks, Cletus Gash, MD  Active   traZODone (DESYREL) 150 MG tablet [id number]  TAKE 1 OR 2 TABLETS AT BEDTIME FOR Karrie Doffing, MD  Active   TRUEplus Lancets 33G MISC [id number]  Test BS daily and as needed Dx E11.9 Claretta Fraise, MD  Active   valsartan-hydrochlorothiazide  (DIOVAN-HCT) 320-25 MG tablet [id number]  Take 1 tablet by mouth daily. Claretta Fraise, MD  Active   zinc gluconate 50 MG tablet [id number] No Take 50 mg by mouth daily. [provider] Taking Active Self            Patient Active Problem List   Diagnosis Date Noted   Gastroesophageal reflux disease without esophagitis 10/20/2019   GAD (generalized anxiety disorder) 10/20/2019   Insomnia due to medical condition 10/20/2019   Hemiparesis affecting right side as late effect of cerebrovascular accident (CVA) ([place]) 04/22/2015   Diabetes mellitus type II, controlled ([place]) 12/23/2014   Hypertension 12/23/2014    Immunization History  Administered Date(s) Administered   Influenza, Quadrivalent, Recombinant, Inj, Pf 01/18/2019   Influenza,inj,Quad PF,6+ Mos 03/22/2015, 01/30/2017, 02/04/2018   Influenza-Unspecified 01/28/2016   PFIZER(Purple Top)SARS-COV-2 Vaccination 08/26/2019, 08/28/2019, 05/04/2020    Conditions to be addressed/monitored: DMII  There are no care plans that you recently modified to display for this patient.   Medication Assistance:  [id number] obtained through [place] medication assistance program.  Enrollment ends 05/06/21  Patient's preferred pharmacy is:  [facility] [address], [place] - [address] [place] [address] Phone: [phone] Fax: [phone]  [facility] ([facility]) - [place], [place] [address] Phone: [phone] Fax: [phone]  [id number] [place]: [phone]: [phone] [facility] [place]- [address] [place]Phone: [phone]: [phone] Follow Up:  Patient agrees to Care Plan and  Follow-up.  Plan: Telephone follow up appointment with care management team member scheduled for:  3-4 WEEKS  JRegina Eck PharmD, BCPS Clinical Pharmacist, [place] Phone [phone]

## 2021-01-15 NOTE — Patient Instructions (Signed)
Visit Information  PATIENT GOALS:  Goals Addressed               This Visit's Progress     Patient Stated     T2DM PHARMD GOAL (pt-stated)        Current Barriers:  Unable to independently afford treatment regimen Unable to maintain control of TD2M/WEIGHT LOSS  Pharmacist Clinical Goal(s):  Over the next 90 days, patient will verbalize ability to afford treatment regimen maintain control of T2DM/WEIGHT LOSS as evidenced by WEIGHT LOSS/CONTINUED [place]  through collaboration with PharmD and provider.   Interventions: 1:1 collaboration with Claretta Fraise, MD regarding development and update of comprehensive plan of care as evidenced by provider attestation and co-signature Inter-disciplinary care team collaboration (see longitudinal plan of care) Comprehensive medication review performed; medication list updated in electronic medical record  Diabetes: Controlled; current treatment: TRULICITY 0.'75mg'$  sq weekly;  PATIENT WOULD LIKE TO BE ON TRULICITY FOR CONTINUED GLYCEMIC CONTROL, CARDIO PROTECTION & WEIGHT LOSS WILL INCREASE TO 1.'5MG'$  SQ WEEKLY NEW DOSE CHANGE FORM SUBMITTED TO [facility] Denies personal and family history of Medullary thyroid cancer ([place]) Patient approved for [place] (trulicity to ship to patient's home) A1C 6.7%, GFR 78 Current glucose readings: fasting glucose: <135, post prandial glucose: <180 Denies hypoglycemic/hyperglycemic symptoms Discussed meal planning options and Plate method for healthy eating Avoid sugary drinks and desserts Incorporate balanced protein, non starchy veggies, 1 serving of carbohydrate with each meal Increase water intake Increase physical activity as able Current exercise: N/A Educated on [place] patient finances. Patient approved for [place] (trulicity to ship to patient's home)  Patient Goals/Self-Care  Activities Over the next 90 days, patient will:  - take medications as prescribed WORK TO LOSE WEIGHT AND CONTINUE GLYCEMIC CONTROL  Follow Up Plan: Telephone follow up appointment with care management team member scheduled for: 1 MONTH         The patient verbalized understanding of instructions, educational materials, and care plan provided today and declined offer to receive copy of patient instructions, educational materials, and care plan.   Telephone follow up appointment with care management team member scheduled for:  Signature Regina Eck, PharmD, BCPS Clinical Pharmacist, [place]  II Phone [phone]

## 2021-02-01 ENCOUNTER — Ambulatory Visit: Payer: Medicare HMO | Admitting: Pharmacist

## 2021-02-01 DIAGNOSIS — E118 Type 2 diabetes mellitus with unspecified complications: Secondary | ICD-10-CM

## 2021-02-01 NOTE — Patient Instructions (Addendum)
Visit Information  PATIENT GOALS:  Goals Addressed               This Visit's Progress     Patient Stated     T2DM PHARMD GOAL (pt-stated)        Current Barriers:  Unable to independently afford treatment regimen Unable to maintain control of TD2M/WEIGHT LOSS  Pharmacist Clinical Goal(s):  Over the next 90 days, patient will verbalize ability to afford treatment regimen maintain control of T2DM/WEIGHT LOSS as evidenced by WEIGHT LOSS/CONTINUED [place]  through collaboration with PharmD and provider.   Interventions: 1:1 collaboration with Claretta Fraise, MD regarding development and update of comprehensive plan of care as evidenced by provider attestation and co-signature Inter-disciplinary care team collaboration (see longitudinal plan of care) Comprehensive medication review performed; medication list updated in electronic medical record  Diabetes: Controlled; current treatment: TRULICITY 1.5mg  sq weekly;  PATIENT WOULD LIKE TO BE ON [place], CARDIO PROTECTION & WEIGHT LOSS WILL INCREASE TO 1.5MG  SQ WEEKLY Denies personal and family history of Medullary thyroid cancer ([place]) Patient approved for [place] (trulicity to ship to patient's home) A1C 6.7%, GFR 78 Current glucose readings: fasting glucose: <135, post prandial glucose: <180 Denies hypoglycemic/hyperglycemic symptoms Discussed meal planning options and Plate method for healthy eating Avoid sugary drinks and desserts Incorporate balanced protein, non starchy veggies, 1 serving of carbohydrate with each meal Increase water intake Increase physical activity as able Current exercise: N/A Educated on [place] patient finances. Patient approved for [place] (trulicity to ship to patient's home)  Patient Goals/Self-Care Activities Over the next 90 days, patient  will:  - take medications as prescribed WORK TO LOSE WEIGHT AND CONTINUE GLYCEMIC CONTROL  Follow Up Plan: Telephone follow up appointment with care management team member scheduled for: 2 MONTHS         The patient verbalized understanding of instructions, educational materials, and care plan provided today and declined offer to receive copy of patient instructions, educational materials, and care plan.   Telephone follow up appointment with care management team member scheduled for: 04/11/21  Signature Regina Eck, PharmD, BCPS Clinical Pharmacist, [place]  II Phone [phone]

## 2021-02-01 NOTE — Progress Notes (Signed)
Chronic Care Management Pharmacy Note  02/01/2021 Name:  Marilyn Ford MRN:  [id number] DOB:  11-Dec-1960  Summary: T2DM  Recommendations/Changes made from today's visit:  Diabetes: Controlled; current treatment: TRULICITY 1.6BW sq weekly;  PATIENT WOULD LIKE TO BE ON TRULICITY FOR CONTINUED GLYCEMIC CONTROL, CARDIO PROTECTION & WEIGHT LOSS CONTINUE TRULICITY 4.6KZ SQ WEEKLY Denies personal and family history of Medullary thyroid cancer ([place]) Patient approved for [place] (trulicity to ship to patient's home) A1C 6.7%, GFR 78 Current glucose readings: fasting glucose: <135, post prandial glucose: <180 Denies hypoglycemic/hyperglycemic symptoms Discussed meal planning options and Plate method for healthy eating Avoid sugary drinks and desserts Incorporate balanced protein, non starchy veggies, 1 serving of carbohydrate with each meal Increase water intake Increase physical activity as able Current exercise: N/A Educated on [place] patient finances. Patient approved for [place] (trulicity to ship to patient's home)  Follow Up Plan: Telephone follow up appointment with care management team member scheduled for: 04/2021  Subjective: Marilyn Ford is an 60 y.o. year old female who is a primary patient of Stacks, Cletus Gash, MD.  The CCM team was consulted for assistance with disease management and care coordination needs.    Engaged with patient by telephone for follow up visit in response to provider referral for pharmacy case management and/or care coordination services.   Consent to Services:  The patient was given information about Chronic Care Management services, agreed to services, and gave verbal consent prior to initiation of services.  Please see initial visit note for detailed documentation.   Patient Care Team: Claretta Fraise, MD as PCP - General (Family Medicine) Claretta Fraise,  MD (Family Medicine) Lavera Guise, [facility] as Pharmacist (Family Medicine)  Objective:  Lab Results  Component Value Date   CREATININE 0.86 11/10/2020   CREATININE 0.88 08/11/2020   CREATININE 0.92 07/18/2020    Lab Results  Component Value Date   HGBA1C 6.7 11/10/2020   Last diabetic Eye exam:  Lab Results  Component Value Date/Time   HMDIABEYEEXA No Retinopathy 10/28/2020 12:00 AM    Last diabetic Foot exam: No results found for: HMDIABFOOTEX      Component Value Date/Time   CHOL 120 11/10/2020 0824   TRIG 189 (H) 11/10/2020 0824   HDL 37 (L) 11/10/2020 0824   CHOLHDL 3.2 11/10/2020 0824   CHOLHDL 7.7 03/13/2015 0438   VLDL UNABLE TO CALCULATE IF TRIGLYCERIDE OVER 400 mg/dL 03/13/2015 0438   LDLCALC 52 11/10/2020 0824    Hepatic Function Latest Ref Rng & Units 11/10/2020 08/11/2020 05/02/2020  Total Protein 6.0 - 8.5 g/dL 6.7 7.5 7.0  Albumin 3.8 - 4.9 g/dL 4.3 4.6 4.2  AST 0 - 40 IU/L '30 31 28  ' ALT 0 - 32 IU/L 36(H) 35(H) 27  Alk Phosphatase 44 - 121 IU/L 80 101 89  Total Bilirubin 0.0 - 1.2 mg/dL 0.4 0.4 0.4  Bilirubin, Direct 0.00 - 0.40 mg/dL - - -    No results found for: TSH, FREET4  CBC Latest Ref Rng & Units 11/10/2020 08/11/2020 05/02/2020  WBC 3.4 - 10.8 x10E3/uL 9.5 8.2 8.0  Hemoglobin 11.1 - 15.9 g/dL 13.6 14.4 14.0  Hematocrit 34.0 - 46.6 % 39.4 42.3 40.7  Platelets 150 - 450 x10E3/uL 240 249 234    No results found for: VD25OH  Clinical ASCVD: Yes  The ASCVD Risk score (Arnett DK, et al., 2019) failed to calculate for the following reasons:   The  valid total cholesterol range is 130 to 320 mg/dL    Other: (CHADS2VASc if Afib, PHQ9 if depression, MMRC or CAT for COPD, ACT, DEXA)  Social History   Tobacco Use  Smoking Status Former   Packs/day: 0.50   Types: Cigarettes   Start date: 12/08/1999   Quit date: 03/12/2015   Years since quitting: 5.8  Smokeless Tobacco Never   BP Readings from Last 3 Encounters:  11/10/20 121/67  08/11/20 131/75   07/19/20 111/61   Pulse Readings from Last 3 Encounters:  11/10/20 71  08/11/20 81  07/19/20 76   Wt Readings from Last 3 Encounters:  11/10/20 170 lb (77.1 kg)  08/16/20 172 lb (78 kg)  08/11/20 172 lb 6.4 oz (78.2 kg)    Assessment: Review of patient past medical history, allergies, medications, health status, including review of consultants reports, laboratory and other test data, was performed as part of comprehensive evaluation and provision of chronic care management services.   SDOH:  (Social Determinants of Health) assessments and interventions performed:    CCM Care Plan  Allergies  Allergen Reactions   Penicillins Shortness Of Breath    Medications Reviewed Today     Reviewed by Lavera Guise, [facility] (Pharmacist) on 02/01/21 at 1440  Med List Status: <None>   Medication Order Taking? Sig Documenting Provider Last Dose Status Informant  Alcohol Swabs (B-D SINGLE USE SWABS REGULAR) PADS [id number]  Test BS daily and as needed Dx E11.9 Claretta Fraise, MD  Active   amLODipine (NORVASC) 10 MG tablet [id number]  1 tablet daily Claretta Fraise, MD  Active   Apple Cider Vinegar 500 MG TABS [id number] No Take 500 mg by mouth daily. [provider] Taking Active Self  aspirin 81 MG EC tablet [id number] No Take 1 tablet (81 mg total) by mouth daily. Claretta Fraise, MD Taking Active Self  atorvastatin (LIPITOR) 80 MG tablet [id number]  TAKE 1 TABLET EVERY DAY AT Nelva Bush, MD  Active   Blood Glucose Calibration (TRUE METRIX LEVEL 1) Low SOLN [id number]  Use with glucometer Dx E11.9 Claretta Fraise, MD  Active   Blood Glucose Monitoring Suppl (TRUE METRIX AIR GLUCOSE METER) w/Device KIT [id number]  Test BS daily and as needed Dx E11.9 Claretta Fraise, MD  Active   Calcium Carb-Cholecalciferol (CALCIUM-VITAMIN D) 600-400 MG-UNIT TABS [id number] No Take 1 tablet by mouth daily. [provider] Taking Active Self  celecoxib (CELEBREX) 200 MG capsule [id number]  TAKE 1  CAPSULE EVERY DAY WITH FOOD Claretta Fraise, MD  Active   clopidogrel (PLAVIX) 75 MG tablet [id number]  Take 1 tablet (75 mg total) by mouth daily. Claretta Fraise, MD  Active   Coenzyme Q10 (COQ10) 100 MG CAPS [id number] No Take 100 mg by mouth daily. [provider] Taking Active Self  Cranberry 425 MG CAPS [id number] No Take 425 mg by mouth in the morning and at bedtime. [provider] Taking Active Self  diphenhydramine-acetaminophen (TYLENOL PM) 25-500 MG TABS tablet [id number]  Take 1 tablet by mouth at bedtime as needed. Claretta Fraise, MD  Active   Dulaglutide (TRULICITY) 1.5 PH/4.3EX [place] [id number]  Inject 1.5 mg into the skin once a week. Claretta Fraise, MD  Active            Med Note Blanca Friend, Royce Macadamia   Wed Feb 01, 2021  2:40 PM) [facility]  Flaxseed, Linseed, (FLAXSEED OIL) 1000 MG CAPS [id number] No Take 1,000 mg by mouth in the morning and at  bedtime. [provider] Taking Active Self  glucose blood (TRUE METRIX BLOOD GLUCOSE TEST) test strip [id number]  Test BS daily and as needed Dx E11.9 Claretta Fraise, MD  Active   Krill Oil 500 MG CAPS [id number]  Take 3 capsules (1,500 mg total) by mouth in the morning and at bedtime. Claretta Fraise, MD  Active   metoprolol (TOPROL-XL) 200 MG 24 hr tablet [id number]  TAKE 1 TABLET ONE TIME DAILY, WITH OR IMMEDIATELY FOLLOWING A MEAL Stacks, [place], MD  Active   Multiple Vitamin (MULTIVITAMIN) capsule [id number] No Take 1 capsule by mouth daily. [provider] Taking Active Self  pantoprazole (PROTONIX) 40 MG tablet [id number]  TAKE 1 TABLET EVERY DAY FOR STOMACH Stacks, Cletus Gash, MD  Active   traZODone (DESYREL) 150 MG tablet [id number]  TAKE 1 OR 2 TABLETS AT BEDTIME FOR Karrie Doffing, MD  Active   TRUEplus Lancets 33G MISC [id number]  Test BS daily and as needed Dx E11.9 Claretta Fraise, MD  Active   valsartan-hydrochlorothiazide (DIOVAN-HCT) 320-25 MG tablet [id number]  Take 1 tablet by mouth  daily. Claretta Fraise, MD  Active   zinc gluconate 50 MG tablet [id number] No Take 50 mg by mouth daily. [provider] Taking Active Self            Patient Active Problem List   Diagnosis Date Noted   Gastroesophageal reflux disease without esophagitis 10/20/2019   GAD (generalized anxiety disorder) 10/20/2019   Insomnia due to medical condition 10/20/2019   Hemiparesis affecting right side as late effect of cerebrovascular accident (CVA) ([place]) 04/22/2015   Diabetes mellitus type II, controlled ([place]) 12/23/2014   Hypertension 12/23/2014    Immunization History  Administered Date(s) Administered   Influenza, Quadrivalent, Recombinant, Inj, Pf 01/18/2019   Influenza,inj,Quad PF,6+ Mos 03/22/2015, 01/30/2017, 02/04/2018   Influenza-Unspecified 01/28/2016   PFIZER(Purple Top)SARS-COV-2 Vaccination 08/26/2019, 08/28/2019, 05/04/2020    Conditions to be addressed/monitored: DMII  Care Plan : PHARMD MEDICATION MANAGEMENT  Updates made by Lavera Guise, [place] since 02/01/2021 12:00 AM     Problem: DISEASE PROGRESSION PREVENTION      Long-Range Goal: T2DM   Recent Progress: On track  Priority: High  Note:   Current Barriers:  Unable to independently afford treatment regimen Unable to maintain control of TD2M/WEIGHT LOSS  Pharmacist Clinical Goal(s):  Over the next 90 days, patient will verbalize ability to afford treatment regimen maintain control of T2DM/WEIGHT LOSS as evidenced by WEIGHT LOSS/CONTINUED [place]  through collaboration with PharmD and provider.   Interventions: 1:1 collaboration with Claretta Fraise, MD regarding development and update of comprehensive plan of care as evidenced by provider attestation and co-signature Inter-disciplinary care team collaboration (see longitudinal plan of care) Comprehensive medication review performed; medication list updated in electronic medical record  Diabetes: Controlled;  current treatment: TRULICITY 3.7HK sq weekly;  PATIENT WOULD LIKE TO BE ON [place], CARDIO PROTECTION & WEIGHT LOSS WILL INCREASE TO 1.5MG SQ WEEKLY Denies personal and family history of Medullary thyroid cancer ([place]) Patient approved for [place] (trulicity to ship to patient's home) A1C 6.7%, GFR 78 Current glucose readings: fasting glucose: <135, post prandial glucose: <180 Denies hypoglycemic/hyperglycemic symptoms Discussed meal planning options and Plate method for healthy eating Avoid sugary drinks and desserts Incorporate balanced protein, non starchy veggies, 1 serving of carbohydrate with each meal Increase water intake Increase physical activity as able Current exercise: N/A Educated on [place] patient finances.  Patient approved for [place] (trulicity to ship to patient's home)  Patient Goals/Self-Care Activities Over the next 90 days, patient will:  - take medications as prescribed WORK TO LOSE WEIGHT AND CONTINUE GLYCEMIC CONTROL  Follow Up Plan: Telephone follow up appointment with care management team member scheduled for: 1 MONTH      Medication Assistance:  TRULICITY obtained through [facility] medication assistance program.  Enrollment ends 05/06/21  Patient's preferred pharmacy is:  [facility] [address], [address] [address] [place] [address] Phone: [phone] Fax: [phone]  [facility] [place], [place] [address] Phone: [phone] Fax: [phone]  [id number] [place] [address]: [phone]: [phone] [facility] [place] [address] [place] [address]Phone: [phone]: [phone]  Follow Up:  Patient agrees to Care Plan and Follow-up.  Plan: Telephone follow up appointment with care management team member scheduled for:  04/11/21   JRegina Eck PharmD, BCPS Clinical Pharmacist, [place] Phone [phone]

## 2021-02-03 DIAGNOSIS — E663 Overweight: Secondary | ICD-10-CM

## 2021-02-03 DIAGNOSIS — E118 Type 2 diabetes mellitus with unspecified complications: Secondary | ICD-10-CM

## 2021-02-20 ENCOUNTER — Encounter: Payer: Self-pay | Admitting: Family Medicine

## 2021-02-20 ENCOUNTER — Ambulatory Visit (INDEPENDENT_AMBULATORY_CARE_PROVIDER_SITE_OTHER): Payer: Medicare HMO

## 2021-02-20 ENCOUNTER — Ambulatory Visit (INDEPENDENT_AMBULATORY_CARE_PROVIDER_SITE_OTHER): Payer: Medicare HMO | Admitting: Family Medicine

## 2021-02-20 ENCOUNTER — Other Ambulatory Visit: Payer: Self-pay

## 2021-02-20 VITALS — BP 124/89 | HR 84 | Temp 97.9°F | Ht 62.0 in | Wt 168.6 lb

## 2021-02-20 DIAGNOSIS — E118 Type 2 diabetes mellitus with unspecified complications: Secondary | ICD-10-CM | POA: Diagnosis not present

## 2021-02-20 DIAGNOSIS — Z780 Asymptomatic menopausal state: Secondary | ICD-10-CM | POA: Diagnosis not present

## 2021-02-20 DIAGNOSIS — I10 Essential (primary) hypertension: Secondary | ICD-10-CM | POA: Diagnosis not present

## 2021-02-20 DIAGNOSIS — E782 Mixed hyperlipidemia: Secondary | ICD-10-CM

## 2021-02-20 LAB — BAYER DCA HB A1C WAIVED: HB A1C (BAYER DCA - WAIVED): 6.2 % — ABNORMAL HIGH (ref 4.8–5.6)

## 2021-02-20 NOTE — Progress Notes (Signed)
Subjective:  Patient ID: Marilyn Ford,  female    DOB: [birth year]  Age: 60 y.o.    CC: Medical Management of Chronic Issues (No complaints.)   HPI Marilyn Ford presents for  follow-up of hypertension. Patient has no history of headache chest pain or shortness of breath or recent cough. Patient also denies symptoms of TIA such as numbness weakness lateralizing. Patient denies side effects from medication. States taking it regularly.  Patient also  in for follow-up of elevated cholesterol. Doing well without complaints on current medication. Denies side effects  including myalgia and arthralgia and nausea. Also in today for liver function testing. Currently no chest pain, shortness of breath or other cardiovascular related symptoms noted.  Follow-up of diabetes. Patient does check blood sugar at home. Readings run between 128 and 150s. Ate 6 pork skins last night, salsa Patient denies symptoms such as excessive hunger or urinary frequency, excessive hunger, nausea No significant hypoglycemic spells noted. Medications reviewed. Pt reports taking them regularly. Pt. denies complication/adverse reaction today.    History Marilyn Ford has a past medical history of Diabetes mellitus without complication ([place]), Hypertension, Stroke ([place]), and Vaginal Pap smear, abnormal.   Marilyn Ford has a past surgical history that includes Cholecystectomy; Cesarean section; Colonoscopy with propofol (N/A, 07/19/2020); and polypectomy (07/19/2020).   Her family history includes Diabetes in her brother and mother.Marilyn Ford reports that Marilyn Ford quit smoking about 5 years ago. Her smoking use included cigarettes. Marilyn Ford started smoking about 21 years ago. Marilyn Ford smoked an average of .5 packs per day. Marilyn Ford has never used smokeless tobacco. Marilyn Ford reports current alcohol use of about 1.0 standard drink per week. Marilyn Ford reports that Marilyn Ford does not use drugs.  Current Outpatient Medications on File Prior to Visit  Medication Sig Dispense Refill    Alcohol Swabs (B-D SINGLE USE SWABS REGULAR) PADS Test BS daily and as needed Dx E11.9 100 each 3   amLODipine (NORVASC) 10 MG tablet 1 tablet daily 90 tablet 3   Apple Cider Vinegar 500 MG TABS Take 500 mg by mouth daily.     aspirin 81 MG EC tablet Take 1 tablet (81 mg total) by mouth daily. 30 tablet 0   atorvastatin (LIPITOR) 80 MG tablet TAKE 1 TABLET EVERY DAY AT 6PM 90 tablet 3   Blood Glucose Calibration (TRUE METRIX LEVEL 1) Low SOLN Use with glucometer Dx E11.9 3 each 0   Blood Glucose Monitoring Suppl (TRUE METRIX AIR GLUCOSE METER) w/Device KIT Test BS daily and as needed Dx E11.9 1 kit 0   Calcium Carb-Cholecalciferol (CALCIUM-VITAMIN D) 600-400 MG-UNIT TABS Take 1 tablet by mouth daily.     celecoxib (CELEBREX) 200 MG capsule TAKE 1 CAPSULE EVERY DAY WITH FOOD 90 capsule 3   clopidogrel (PLAVIX) 75 MG tablet Take 1 tablet (75 mg total) by mouth daily. 90 tablet 3   Coenzyme Q10 (COQ10) 100 MG CAPS Take 100 mg by mouth daily.     Cranberry 425 MG CAPS Take 425 mg by mouth in the morning and at bedtime.     diphenhydramine-acetaminophen (TYLENOL PM) 25-500 MG TABS tablet Take 1 tablet by mouth at bedtime as needed. 60 tablet    Dulaglutide (TRULICITY) 1.5 RC/7.8LF SOPN Inject 1.5 mg into the skin once a week. 6 mL 4   Flaxseed, Linseed, (FLAXSEED OIL) 1000 MG CAPS Take 1,000 mg by mouth in the morning and at bedtime.     glucose blood (TRUE METRIX BLOOD GLUCOSE TEST) test strip Test BS daily  and as needed Dx E11.9 100 each 3   Krill Oil 500 MG CAPS Take 3 capsules (1,500 mg total) by mouth in the morning and at bedtime.     metoprolol (TOPROL-XL) 200 MG 24 hr tablet TAKE 1 TABLET ONE TIME DAILY, WITH OR IMMEDIATELY FOLLOWING A MEAL 90 tablet 3   Multiple Vitamin (MULTIVITAMIN) capsule Take 1 capsule by mouth daily.     pantoprazole (PROTONIX) 40 MG tablet TAKE 1 TABLET EVERY DAY FOR STOMACH 90 tablet 3   traZODone (DESYREL) 150 MG tablet TAKE 1 OR 2 TABLETS AT BEDTIME FOR SLEEP 180  tablet 3   TRUEplus Lancets 33G MISC Test BS daily and as needed Dx E11.9 100 each 3   valsartan-hydrochlorothiazide (DIOVAN-HCT) 320-25 MG tablet Take 1 tablet by mouth daily. 90 tablet 3   zinc gluconate 50 MG tablet Take 50 mg by mouth daily.     No current facility-administered medications on file prior to visit.    ROS Review of Systems  Constitutional: Negative.   HENT:  Negative for congestion.   Eyes:  Negative for visual disturbance.  Respiratory:  Negative for shortness of breath.   Cardiovascular:  Negative for chest pain.  Gastrointestinal:  Negative for abdominal pain, constipation, diarrhea, nausea and vomiting.  Genitourinary:  Negative for difficulty urinating.  Musculoskeletal:  Negative for arthralgias and myalgias.  Neurological:  Negative for headaches.  Psychiatric/Behavioral:  Negative for sleep disturbance.    Objective:  BP 124/89   Pulse 84   Temp 97.9 F (36.6 C) (Temporal)   Ht '5\' 2"'  (1.575 m)   Wt 168 lb 9.6 oz (76.5 kg)   BMI 30.84 kg/m   BP Readings from Last 3 Encounters:  02/20/21 124/89  11/10/20 121/67  08/11/20 131/75    Wt Readings from Last 3 Encounters:  02/20/21 168 lb 9.6 oz (76.5 kg)  11/10/20 170 lb (77.1 kg)  08/16/20 172 lb (78 kg)     Physical Exam Constitutional:      General: Marilyn Ford is not in acute distress.    Appearance: Marilyn Ford is well-developed.  HENT:     Head: Normocephalic and atraumatic.  Eyes:     Conjunctiva/sclera: Conjunctivae normal.     Pupils: Pupils are equal, round, and reactive to light.  Neck:     Thyroid: No thyromegaly.  Cardiovascular:     Rate and Rhythm: Normal rate and regular rhythm.     Heart sounds: Normal heart sounds. No murmur heard. Pulmonary:     Effort: Pulmonary effort is normal. No respiratory distress.     Breath sounds: Normal breath sounds. No wheezing or rales.  Abdominal:     General: Bowel sounds are normal. There is no distension.     Palpations: Abdomen is soft.      Tenderness: There is no abdominal tenderness.  Musculoskeletal:        General: Normal range of motion.     Cervical back: Normal range of motion and neck supple.  Lymphadenopathy:     Cervical: No cervical adenopathy.  Skin:    General: Skin is warm and dry.  Neurological:     Mental Status: Marilyn Ford is alert and oriented to person, place, and time.  Psychiatric:        Behavior: Behavior normal.        Thought Content: Thought content normal.        Judgment: Judgment normal.        Assessment & Plan:   Marilyn Ford was seen  today for medical management of chronic issues.  Diagnoses and all orders for this visit:  Controlled type 2 diabetes mellitus with complication, without long-term current use of insulin ([place]) -     CBC with Differential/Platelet -     CMP14+EGFR  Mixed hyperlipidemia -     Lipid panel  Essential hypertension -     Bayer DCA Hb A1c Waived  Post-menopause -     DG WRFM DEXA  I am having Marilyn Ford maintain her multivitamin, aspirin, Flaxseed Oil, Apple Cider Vinegar, Calcium-Vitamin D, zinc gluconate, CoQ10, Cranberry, amLODipine, atorvastatin, celecoxib, clopidogrel, metoprolol, pantoprazole, traZODone, valsartan-hydrochlorothiazide, Krill Oil, diphenhydramine-acetaminophen, True Metrix Air Glucose Meter, True Metrix Blood Glucose Test, TRUEplus Lancets 33G, True Metrix Level 1, B-D SINGLE USE SWABS REGULAR, and Trulicity.  No orders of the defined types were placed in this encounter.    Follow-up: Return in about 3 months (around 05/23/2021).  Claretta Fraise, M.D.

## 2021-02-21 LAB — CBC WITH DIFFERENTIAL/PLATELET
Basophils Absolute: 0.1 10*3/uL (ref 0.0–0.2)
Basos: 1 %
EOS (ABSOLUTE): 0.2 10*3/uL (ref 0.0–0.4)
Eos: 3 %
Hematocrit: 42.5 % (ref 34.0–46.6)
Hemoglobin: 14.7 g/dL (ref 11.1–15.9)
Immature Grans (Abs): 0 10*3/uL (ref 0.0–0.1)
Immature Granulocytes: 0 %
Lymphocytes Absolute: 1.7 10*3/uL (ref 0.7–3.1)
Lymphs: 20 %
MCH: 31.7 pg (ref 26.6–33.0)
MCHC: 34.6 g/dL (ref 31.5–35.7)
MCV: 92 fL (ref 79–97)
Monocytes Absolute: 0.5 10*3/uL (ref 0.1–0.9)
Monocytes: 6 %
Neutrophils Absolute: 6.1 10*3/uL (ref 1.4–7.0)
Neutrophils: 70 %
Platelets: 241 10*3/uL (ref 150–450)
RBC: 4.64 x10E6/uL (ref 3.77–5.28)
RDW: 12.9 % (ref 11.7–15.4)
WBC: 8.7 10*3/uL (ref 3.4–10.8)

## 2021-02-21 LAB — CMP14+EGFR
ALT: 39 IU/L — ABNORMAL HIGH (ref 0–32)
AST: 35 IU/L (ref 0–40)
Albumin/Globulin Ratio: 1.9 (ref 1.2–2.2)
Albumin: 4.5 g/dL (ref 3.8–4.9)
Alkaline Phosphatase: 94 IU/L (ref 44–121)
BUN/Creatinine Ratio: 11 — ABNORMAL LOW (ref 12–28)
BUN: 11 mg/dL (ref 8–27)
Bilirubin Total: 0.5 mg/dL (ref 0.0–1.2)
CO2: 25 mmol/L (ref 20–29)
Calcium: 9.9 mg/dL (ref 8.7–10.3)
Chloride: 95 mmol/L — ABNORMAL LOW (ref 96–106)
Creatinine, Ser: 0.97 mg/dL (ref 0.57–1.00)
Globulin, Total: 2.4 g/dL (ref 1.5–4.5)
Glucose: 150 mg/dL — ABNORMAL HIGH (ref 70–99)
Potassium: 4.4 mmol/L (ref 3.5–5.2)
Sodium: 137 mmol/L (ref 134–144)
Total Protein: 6.9 g/dL (ref 6.0–8.5)
eGFR: 67 mL/min/{1.73_m2} (ref >59)

## 2021-02-21 LAB — LIPID PANEL
Chol/HDL Ratio: 3.9 ratio (ref 0.0–4.4)
Cholesterol, Total: 152 mg/dL (ref 100–199)
HDL: 39 mg/dL — ABNORMAL LOW (ref >39)
LDL Chol Calc (NIH): 54 mg/dL (ref 0–99)
Triglycerides: 390 mg/dL — ABNORMAL HIGH (ref 0–149)
VLDL Cholesterol Cal: 59 mg/dL — ABNORMAL HIGH (ref 5–40)

## 2021-03-01 ENCOUNTER — Ambulatory Visit
Admission: RE | Admit: 2021-03-01 | Discharge: 2021-03-01 | Disposition: A | Discharge status: Home / Self Care | Payer: Medicare HMO | Source: Ambulatory Visit | Attending: Family Medicine | Admitting: Family Medicine

## 2021-03-01 ENCOUNTER — Other Ambulatory Visit: Payer: Self-pay

## 2021-03-01 DIAGNOSIS — Z1231 Encounter for screening mammogram for malignant neoplasm of breast: Secondary | ICD-10-CM | POA: Diagnosis not present

## 2021-03-02 NOTE — Progress Notes (Signed)
Results letter printed and mailed to pt.

## 2021-03-07 ENCOUNTER — Other Ambulatory Visit: Payer: Self-pay | Admitting: Family Medicine

## 2021-03-07 DIAGNOSIS — R928 Other abnormal and inconclusive findings on diagnostic imaging of breast: Secondary | ICD-10-CM

## 2021-03-27 ENCOUNTER — Ambulatory Visit
Admission: RE | Admit: 2021-03-27 | Discharge: 2021-03-27 | Disposition: A | Discharge status: Home / Self Care | Payer: Medicare HMO | Source: Ambulatory Visit | Attending: Family Medicine | Admitting: Family Medicine

## 2021-03-27 ENCOUNTER — Other Ambulatory Visit: Payer: Self-pay

## 2021-03-27 ENCOUNTER — Other Ambulatory Visit: Payer: Self-pay | Admitting: Family Medicine

## 2021-03-27 DIAGNOSIS — R922 Inconclusive mammogram: Secondary | ICD-10-CM | POA: Diagnosis not present

## 2021-03-27 DIAGNOSIS — R928 Other abnormal and inconclusive findings on diagnostic imaging of breast: Secondary | ICD-10-CM

## 2021-03-27 DIAGNOSIS — N6489 Other specified disorders of breast: Secondary | ICD-10-CM | POA: Diagnosis not present

## 2021-03-27 DIAGNOSIS — R921 Mammographic calcification found on diagnostic imaging of breast: Secondary | ICD-10-CM | POA: Diagnosis not present

## 2021-04-06 ENCOUNTER — Ambulatory Visit
Admission: RE | Admit: 2021-04-06 | Discharge: 2021-04-06 | Disposition: A | Discharge status: Home / Self Care | Payer: Medicare HMO | Source: Ambulatory Visit | Attending: Family Medicine | Admitting: Family Medicine

## 2021-04-06 ENCOUNTER — Other Ambulatory Visit: Payer: Self-pay | Admitting: Radiology

## 2021-04-06 DIAGNOSIS — R921 Mammographic calcification found on diagnostic imaging of breast: Secondary | ICD-10-CM | POA: Diagnosis not present

## 2021-04-11 ENCOUNTER — Ambulatory Visit (INDEPENDENT_AMBULATORY_CARE_PROVIDER_SITE_OTHER): Payer: Medicare HMO | Admitting: Pharmacist

## 2021-04-11 DIAGNOSIS — I10 Essential (primary) hypertension: Secondary | ICD-10-CM

## 2021-04-11 DIAGNOSIS — E118 Type 2 diabetes mellitus with unspecified complications: Secondary | ICD-10-CM

## 2021-04-11 NOTE — Patient Instructions (Signed)
Visit Information  Thank you for taking time to visit with me today. Please don't hesitate to contact me if I can be of assistance to you before our next scheduled telephone appointment.  Following are the goals we discussed today:  Current Barriers:  Unable to independently afford treatment regimen Unable to maintain control of TD2M/WEIGHT LOSS  Pharmacist Clinical Goal(s):  Over the next 90 days, patient will verbalize ability to afford treatment regimen maintain control of T2DM/WEIGHT LOSS as evidenced by WEIGHT LOSS/CONTINUED [place]  through collaboration with PharmD and provider.   Interventions: 1:1 collaboration with Claretta Fraise, MD regarding development and update of comprehensive plan of care as evidenced by provider attestation and co-signature Inter-disciplinary care team collaboration (see longitudinal plan of care) Comprehensive medication review performed; medication list updated in electronic medical record  Diabetes: Controlled; current treatment: TRULICITY 1.5mg  sq weekly;  PATIENT WOULD LIKE TO BE ON TRULICITY FOR CONTINUED GLYCEMIC CONTROL, CARDIO PROTECTION & WEIGHT LOSS WILL INCREASE TO 1.5MG  SQ WEEKLY Denies personal and family history of Medullary thyroid cancer ([facility]) A1C 6.7%-->6.2%, GFR 78 Current glucose readings: fasting glucose: <135, post prandial glucose: <180 Denies hypoglycemic/hyperglycemic symptoms Discussed meal planning options and Plate method for healthy eating Avoid sugary drinks and desserts Incorporate balanced protein, non starchy veggies, 1 serving of carbohydrate with each meal Increase water intake Increase physical activity as able Current exercise: N/A Educated on [place], reviewed meds for hypertension/BP controlled Assessed patient finances. RE-ENROLLMENT FOR 2023 S[facility] TO [facility] (trulicity to ship to patient's home)  Patient Goals/Self-Care  Activities Over the next 90 days, patient will:  - take medications as prescribed WORK TO LOSE WEIGHT AND CONTINUE GLYCEMIC CONTROL  Follow Up Plan: Telephone follow up appointment with care management team member scheduled for: 3 MONTHS   Please call the care guide team at [phone] if you need to cancel or reschedule your appointment.   The patient verbalized understanding of instructions, educational materials, and care plan provided today and declined offer to receive copy of patient instructions, educational materials, and care plan.     Regina Eck, PharmD, BCPS Clinical Pharmacist, [place]  II Phone [phone]

## 2021-04-11 NOTE — Progress Notes (Signed)
Chronic Care Management Pharmacy Note  04/11/2021 Name:  Marilyn Ford MRN:  [id number] DOB:  May 24, 1960  Summary: T2DM, HTN  Recommendations/Changes made from today's visit: Diabetes: Controlled; current treatment: TRULICITY 0.3OZ sq weekly;  PATIENT WOULD LIKE TO BE ON TRULICITY FOR CONTINUED GLYCEMIC CONTROL, CARDIO PROTECTION & WEIGHT LOSS WILL INCREASE TO 1.5MG SQ WEEKLY Denies personal and family history of Medullary thyroid cancer (MTC) A1C 6.7%-->6.2%, GFR 78 Current glucose readings: fasting glucose: <135, post prandial glucose: <180 Denies hypoglycemic/hyperglycemic symptoms Discussed meal planning options and Plate method for healthy eating Avoid sugary drinks and desserts Incorporate balanced protein, non starchy veggies, 1 serving of carbohydrate with each meal Increase water intake Increase physical activity as able Current exercise: N/A Educated on TRULICITY INCREASE, reviewed meds for hypertension/BP controlled Assessed patient finances. RE-ENROLLMENT FOR 2023 SENT TO [facility] (trulicity to ship to patient's home)  Patient Goals/Self-Care Activities Over the next 90 days, patient will:  - take medications as prescribed WORK TO LOSE WEIGHT AND CONTINUE GLYCEMIC CONTROL  Follow Up Plan: Telephone follow up appointment with care management team member scheduled for: 3 MONTHS  Subjective: Marilyn Ford is an 60 y.o. year old female who is a primary patient of Stacks, Cletus Gash, MD.  The CCM team was consulted for assistance with disease management and care coordination needs.    Engaged with patient by telephone for follow up visit in response to provider referral for pharmacy case management and/or care coordination services.   Consent to Services:  The patient was given information about Chronic Care Management services, agreed to services, and gave verbal consent prior to initiation of services.  Please see initial visit  note for detailed documentation.   Patient Care Team: Claretta Fraise, MD as PCP - General (Family Medicine) Claretta Fraise, MD (Family Medicine) Lavera Guise, [facility] as Pharmacist (Family Medicine)  Objective:  Lab Results  Component Value Date   CREATININE 0.97 02/20/2021   CREATININE 0.86 11/10/2020   CREATININE 0.88 08/11/2020    Lab Results  Component Value Date   HGBA1C 6.2 (H) 02/20/2021   Last diabetic Eye exam:  Lab Results  Component Value Date/Time   HMDIABEYEEXA No Retinopathy 10/28/2020 12:00 AM    Last diabetic Foot exam: No results found for: HMDIABFOOTEX      Component Value Date/Time   CHOL 152 02/20/2021 0921   TRIG 390 (H) 02/20/2021 0921   HDL 39 (L) 02/20/2021 0921   CHOLHDL 3.9 02/20/2021 0921   CHOLHDL 7.7 03/13/2015 0438   VLDL UNABLE TO CALCULATE IF TRIGLYCERIDE OVER 400 mg/dL 03/13/2015 0438   LDLCALC 54 02/20/2021 0921    Hepatic Function Latest Ref Rng & Units 02/20/2021 11/10/2020 08/11/2020  Total Protein 6.0 - 8.5 g/dL 6.9 6.7 7.5  Albumin 3.8 - 4.9 g/dL 4.5 4.3 4.6  AST 0 - 40 IU/L 35 30 31  ALT 0 - 32 IU/L 39(H) 36(H) 35(H)  Alk Phosphatase 44 - 121 IU/L 94 80 101  Total Bilirubin 0.0 - 1.2 mg/dL 0.5 0.4 0.4  Bilirubin, Direct 0.00 - 0.40 mg/dL - - -    No results found for: TSH, FREET4  CBC Latest Ref Rng & Units 02/20/2021 11/10/2020 08/11/2020  WBC 3.4 - 10.8 x10E3/uL 8.7 9.5 8.2  Hemoglobin 11.1 - 15.9 g/dL 14.7 13.6 14.4  Hematocrit 34.0 - 46.6 % 42.5 39.4 42.3  Platelets 150 - 450 x10E3/uL 241 240 249    No results found for: VD25OH  Clinical ASCVD:  No  The 10-year ASCVD risk score (Arnett DK, et al., 2019) is: 8%   Values used to calculate the score:     Age: 68 years     Sex: Female     Is Non-Hispanic African American: No     Diabetic: Yes     Tobacco smoker: No     Systolic Blood Pressure: 785 mmHg     Is BP treated: Yes     HDL Cholesterol: 39 mg/dL     Total Cholesterol: 152 mg/dL    Other: (CHADS2VASc if  Afib, PHQ9 if depression, MMRC or CAT for COPD, ACT, DEXA)  Social History   Tobacco Use  Smoking Status Former   Packs/day: 0.50   Types: Cigarettes   Start date: 12/08/1999   Quit date: 03/12/2015   Years since quitting: 6.0  Smokeless Tobacco Never   BP Readings from Last 3 Encounters:  02/20/21 124/89  11/10/20 121/67  08/11/20 131/75   Pulse Readings from Last 3 Encounters:  02/20/21 84  11/10/20 71  08/11/20 81   Wt Readings from Last 3 Encounters:  02/20/21 168 lb 9.6 oz (76.5 kg)  11/10/20 170 lb (77.1 kg)  08/16/20 172 lb (78 kg)    Assessment: Review of patient past medical history, allergies, medications, health status, including review of consultants reports, laboratory and other test data, was performed as part of comprehensive evaluation and provision of chronic care management services.   SDOH:  (Social Determinants of Health) assessments and interventions performed:    CCM Care Plan  Allergies  Allergen Reactions   Penicillins Shortness Of Breath    Medications Reviewed Today     Reviewed by Lavera Guise, [facility] (Pharmacist) on 04/11/21 at [place] List Status: <None>   Medication Order Taking? Sig Documenting Provider Last Dose Status Informant  Alcohol Swabs (B-D SINGLE USE SWABS REGULAR) PADS [id number] No Test BS daily and as needed Dx E11.9 Claretta Fraise, MD Taking Active   amLODipine (NORVASC) 10 MG tablet [id number] No 1 tablet daily Claretta Fraise, MD Taking Active   Apple Cider Vinegar 500 MG TABS [id number] No Take 500 mg by mouth daily. [provider] Taking Active Self  aspirin 81 MG EC tablet [id number] No Take 1 tablet (81 mg total) by mouth daily. Claretta Fraise, MD Taking Active Self  atorvastatin (LIPITOR) 80 MG tablet [id number] No TAKE 1 TABLET EVERY DAY AT Nelva Bush, MD Taking Active   Blood Glucose Calibration (TRUE METRIX LEVEL 1) Low SOLN [id number] No Use with glucometer Dx E11.9 Claretta Fraise, MD Taking Active    Blood Glucose Monitoring Suppl (TRUE METRIX AIR GLUCOSE METER) w/Device KIT [id number] No Test BS daily and as needed Dx E11.9 Claretta Fraise, MD Taking Active   Calcium Carb-Cholecalciferol (CALCIUM-VITAMIN D) 600-400 MG-UNIT TABS [id number] No Take 1 tablet by mouth daily. [provider] Taking Active Self  celecoxib (CELEBREX) 200 MG capsule [id number] No TAKE 1 CAPSULE EVERY DAY WITH FOOD Stacks, Cletus Gash, MD Taking Active   clopidogrel (PLAVIX) 75 MG tablet [id number] No Take 1 tablet (75 mg total) by mouth daily. Claretta Fraise, MD Taking Active   Coenzyme Q10 (COQ10) 100 MG CAPS [id number] No Take 100 mg by mouth daily. [provider] Taking Active Self  Cranberry 425 MG CAPS [id number] No Take 425 mg by mouth in the morning and at bedtime. [provider] Taking Active Self  diphenhydramine-acetaminophen (TYLENOL PM) 25-500 MG TABS tablet [id number] No Take 1 tablet by mouth at  bedtime as needed. Claretta Fraise, MD Taking Active   Dulaglutide (TRULICITY) 1.5 TD/3.2KG SOPN [id number] No Inject 1.5 mg into the skin once a week. Claretta Fraise, MD Taking Active            Med Note Blanca Friend, Royce Macadamia   Wed Feb 01, 2021  2:40 PM) [facility]  Flaxseed, Linseed, (FLAXSEED OIL) 1000 MG CAPS [id number] No Take 1,000 mg by mouth in the morning and at bedtime. [provider] Taking Active Self  glucose blood (TRUE METRIX BLOOD GLUCOSE TEST) test strip [id number] No Test BS daily and as needed Dx E11.9 Claretta Fraise, MD Taking Active   Krill Oil 500 MG CAPS [id number] No Take 3 capsules (1,500 mg total) by mouth in the morning and at bedtime. Claretta Fraise, MD Taking Active   metoprolol (TOPROL-XL) 200 MG 24 hr tablet [id number] No TAKE 1 TABLET ONE TIME DAILY, WITH OR IMMEDIATELY FOLLOWING A MEAL Stacks, Warren, MD Taking Active   Multiple Vitamin (MULTIVITAMIN) capsule [id number] No Take 1 capsule by mouth daily. [provider] Taking Active  Self  pantoprazole (PROTONIX) 40 MG tablet [id number] No TAKE 1 TABLET EVERY DAY FOR STOMACH Stacks, Warren, MD Taking Active   traZODone (DESYREL) 150 MG tablet [id number] No TAKE 1 OR 2 TABLETS AT BEDTIME FOR Karrie Doffing, MD Taking Active   TRUEplus Lancets 33G MISC [id number] No Test BS daily and as needed Dx E11.9 Claretta Fraise, MD Taking Active   valsartan-hydrochlorothiazide (DIOVAN-HCT) 320-25 MG tablet [id number] No Take 1 tablet by mouth daily. Claretta Fraise, MD Taking Active   zinc gluconate 50 MG tablet [id number] No Take 50 mg by mouth daily. [provider] Taking Active Self            Patient Active Problem List   Diagnosis Date Noted   Gastroesophageal reflux disease without esophagitis 10/20/2019   GAD (generalized anxiety disorder) 10/20/2019   Insomnia due to medical condition 10/20/2019   Hemiparesis affecting right side as late effect of cerebrovascular accident (CVA) ([place]) 04/22/2015   Diabetes mellitus type II, controlled ([place]) 12/23/2014   Hypertension 12/23/2014    Immunization History  Administered Date(s) Administered   Influenza, Quadrivalent, Recombinant, Inj, Pf 01/18/2019   Influenza,inj,Quad PF,6+ Mos 03/22/2015, 01/30/2017, 02/04/2018   Influenza-Unspecified 01/28/2016   PFIZER(Purple Top)SARS-COV-2 Vaccination 08/26/2019, 08/28/2019, 05/04/2020    Conditions to be addressed/monitored: HTN and DMII  Care Plan : PHARMD MEDICATION MANAGEMENT  Updates made by Lavera Guise, [place] since 04/11/2021 12:00 AM     Problem: DISEASE PROGRESSION PREVENTION      Long-Range Goal: T2DM   Recent Progress: On track  Priority: High  Note:   Current Barriers:  Unable to independently afford treatment regimen Unable to maintain control of TD2M/WEIGHT LOSS  Pharmacist Clinical Goal(s):  Over the next 90 days, patient will verbalize ability to afford treatment regimen maintain control of T2DM/WEIGHT LOSS as evidenced by WEIGHT  LOSS/CONTINUED [place]  through collaboration with PharmD and provider.   Interventions: 1:1 collaboration with Claretta Fraise, MD regarding development and update of comprehensive plan of care as evidenced by provider attestation and co-signature Inter-disciplinary care team collaboration (see longitudinal plan of care) Comprehensive medication review performed; medication list updated in electronic medical record  Diabetes: Controlled; current treatment: TRULICITY 5.0DT sq weekly;  PATIENT WOULD LIKE TO BE ON [place], CARDIO PROTECTION & WEIGHT LOSS WILL INCREASE TO 1.5MG SQ WEEKLY Denies personal and family  history of Medullary thyroid cancer (MTC) A1C 6.7%-->6.2%, GFR 78 Current glucose readings: fasting glucose: <135, post prandial glucose: <180 Denies hypoglycemic/hyperglycemic symptoms Discussed meal planning options and Plate method for healthy eating Avoid sugary drinks and desserts Incorporate balanced protein, non starchy veggies, 1 serving of carbohydrate with each meal Increase water intake Increase physical activity as able Current exercise: N/A Educated on TRULICITY INCREASE, reviewed meds for hypertension/BP controlled Assessed patient finances. RE-ENROLLMENT FOR 2023 SENT TO [facility] (trulicity to ship to patient's home)  Patient Goals/Self-Care Activities Over the next 90 days, patient will:  - take medications as prescribed WORK TO LOSE WEIGHT AND CONTINUE GLYCEMIC CONTROL  Follow Up Plan: Telephone follow up appointment with care management team member scheduled for: 3 MONTHS      Medication Assistance:  TRULICITY obtained through [place] medication assistance program.  Enrollment ends 05/06/22--RE ENROLL SUBMITTED TODAY  Patient's preferred pharmacy is:  [facility] [address], [place] - [address] [place]  [address] Phone: [phone] Fax: [phone]  [id number] [id number]: [phone]: [phone]  Uses pill box? No - N/A Pt endorses 100% compliance  Follow Up:  Patient agrees to Care Plan and Follow-up.  Plan: Telephone follow up appointment with care management team member scheduled for:  06/27/21   JRegina Eck PharmD, BCPS Clinical Pharmacist, [place] Phone [phone]

## 2021-04-18 ENCOUNTER — Ambulatory Visit: Payer: Medicare HMO | Admitting: General Surgery

## 2021-05-02 ENCOUNTER — Ambulatory Visit: Payer: Medicare HMO | Admitting: General Surgery

## 2021-05-06 DIAGNOSIS — I10 Essential (primary) hypertension: Secondary | ICD-10-CM

## 2021-05-06 DIAGNOSIS — E118 Type 2 diabetes mellitus with unspecified complications: Secondary | ICD-10-CM

## 2021-05-11 NOTE — Progress Notes (Signed)
Pt aware and voiced understanding 

## 2021-05-11 NOTE — Progress Notes (Signed)
PLease notify pt. Of approval for trulicity

## 2021-05-11 NOTE — Progress Notes (Signed)
Received notification from [place] regarding RE-ENROLLMENT approval for TRULICITY 1.5MG /0.5ML. Patient assistance approved from 05/07/21 to 05/06/22.  MEDICATION WILL SHIP TO PT'S HOME  Phone: [phone]

## 2021-05-25 ENCOUNTER — Ambulatory Visit (INDEPENDENT_AMBULATORY_CARE_PROVIDER_SITE_OTHER): Payer: No Typology Code available for payment source | Admitting: Family Medicine

## 2021-05-25 ENCOUNTER — Other Ambulatory Visit: Payer: Self-pay

## 2021-05-25 ENCOUNTER — Encounter: Payer: Self-pay | Admitting: Family Medicine

## 2021-05-25 VITALS — BP 115/78 | HR 81 | Temp 97.7°F | Ht 62.0 in | Wt 168.6 lb

## 2021-05-25 DIAGNOSIS — E118 Type 2 diabetes mellitus with unspecified complications: Secondary | ICD-10-CM

## 2021-05-25 DIAGNOSIS — E782 Mixed hyperlipidemia: Secondary | ICD-10-CM

## 2021-05-25 DIAGNOSIS — I10 Essential (primary) hypertension: Secondary | ICD-10-CM | POA: Diagnosis not present

## 2021-05-25 LAB — CMP14+EGFR
ALT: 34 IU/L — ABNORMAL HIGH (ref 0–32)
AST: 25 IU/L (ref 0–40)
Albumin/Globulin Ratio: 1.7 (ref 1.2–2.2)
Albumin: 4.5 g/dL (ref 3.8–4.9)
Alkaline Phosphatase: 109 IU/L (ref 44–121)
BUN/Creatinine Ratio: 18 (ref 12–28)
BUN: 17 mg/dL (ref 8–27)
Bilirubin Total: 0.5 mg/dL (ref 0.0–1.2)
CO2: 28 mmol/L (ref 20–29)
Calcium: 10.2 mg/dL (ref 8.7–10.3)
Chloride: 99 mmol/L (ref 96–106)
Creatinine, Ser: 0.93 mg/dL (ref 0.57–1.00)
Globulin, Total: 2.7 g/dL (ref 1.5–4.5)
Glucose: 162 mg/dL — ABNORMAL HIGH (ref 70–99)
Potassium: 4.7 mmol/L (ref 3.5–5.2)
Sodium: 141 mmol/L (ref 134–144)
Total Protein: 7.2 g/dL (ref 6.0–8.5)
eGFR: 70 mL/min/{1.73_m2} (ref >59)

## 2021-05-25 LAB — CBC WITH DIFFERENTIAL/PLATELET
Basophils Absolute: 0.1 10*3/uL (ref 0.0–0.2)
Basos: 1 %
EOS (ABSOLUTE): 0.2 10*3/uL (ref 0.0–0.4)
Eos: 2 %
Hematocrit: 41.7 % (ref 34.0–46.6)
Hemoglobin: 13.9 g/dL (ref 11.1–15.9)
Immature Grans (Abs): 0 10*3/uL (ref 0.0–0.1)
Immature Granulocytes: 0 %
Lymphocytes Absolute: 1.8 10*3/uL (ref 0.7–3.1)
Lymphs: 24 %
MCH: 30.6 pg (ref 26.6–33.0)
MCHC: 33.3 g/dL (ref 31.5–35.7)
MCV: 92 fL (ref 79–97)
Monocytes Absolute: 0.5 10*3/uL (ref 0.1–0.9)
Monocytes: 6 %
Neutrophils Absolute: 5 10*3/uL (ref 1.4–7.0)
Neutrophils: 67 %
Platelets: 247 10*3/uL (ref 150–450)
RBC: 4.54 x10E6/uL (ref 3.77–5.28)
RDW: 12.1 % (ref 11.7–15.4)
WBC: 7.6 10*3/uL (ref 3.4–10.8)

## 2021-05-25 LAB — LIPID PANEL
Chol/HDL Ratio: 3.9 ratio (ref 0.0–4.4)
Cholesterol, Total: 150 mg/dL (ref 100–199)
HDL: 38 mg/dL — ABNORMAL LOW (ref >39)
LDL Chol Calc (NIH): 57 mg/dL (ref 0–99)
Triglycerides: 359 mg/dL — ABNORMAL HIGH (ref 0–149)
VLDL Cholesterol Cal: 55 mg/dL — ABNORMAL HIGH (ref 5–40)

## 2021-05-25 LAB — BAYER DCA HB A1C WAIVED: HB A1C (BAYER DCA - WAIVED): 6.7 % — ABNORMAL HIGH (ref 4.8–5.6)

## 2021-05-25 NOTE — Patient Instructions (Signed)

## 2021-05-25 NOTE — Progress Notes (Signed)
Subjective:  Patient ID: Marilyn Ford,  female    DOB: 06-01-1960  Age: 61 y.o.    CC: Medical Management of Chronic Issues   HPI URA YINGLING presents for  follow-up of hypertension. Patient has no history of headache chest pain or shortness of breath or recent cough. Patient also denies symptoms of TIA such as numbness weakness lateralizing. Patient denies side effects from medication. States taking it regularly.  Sleeping from 9:00 to 1AM and up for an hour. Leaves the TV on all night.  Patient also  in for follow-up of elevated cholesterol. Doing well without complaints on current medication. Denies side effects  including myalgia and arthralgia and nausea. Also in today for liver function testing. Currently no chest pain, shortness of breath or other cardiovascular related symptoms noted.  Follow-up of diabetes. Patient does check blood sugar at home. Readings run between 110 and 180 Patient denies symptoms such as excessive hunger or urinary frequency, excessive hunger, nausea No significant hypoglycemic spells noted. Medications reviewed. Pt reports taking them regularly. Pt. denies complication/adverse reaction today.    History Rebeca has a past medical history of Diabetes mellitus without complication ([place]), Hypertension, Stroke ([place]), and Vaginal Pap smear, abnormal.   She has a past surgical history that includes Cholecystectomy; Cesarean section; Colonoscopy with propofol (N/A, 07/19/2020); and polypectomy (07/19/2020).   Her family history includes Diabetes in her brother and mother.She reports that she quit smoking about 6 years ago. Her smoking use included cigarettes. She started smoking about 21 years ago. She smoked an average of .5 packs per day. She has never used smokeless tobacco. She reports current alcohol use of about 1.0 standard drink per week. She reports that she does not use drugs.  Current Outpatient Medications on File Prior to Visit  Medication  Sig Dispense Refill   Alcohol Swabs (B-D SINGLE USE SWABS REGULAR) PADS Test BS daily and as needed Dx E11.9 100 each 3   amLODipine (NORVASC) 10 MG tablet 1 tablet daily 90 tablet 3   Apple Cider Vinegar 500 MG TABS Take 500 mg by mouth daily.     aspirin 81 MG EC tablet Take 1 tablet (81 mg total) by mouth daily. 30 tablet 0   atorvastatin (LIPITOR) 80 MG tablet TAKE 1 TABLET EVERY DAY AT 6PM 90 tablet 3   Blood Glucose Calibration (TRUE METRIX LEVEL 1) Low SOLN Use with glucometer Dx E11.9 3 each 0   Blood Glucose Monitoring Suppl (TRUE METRIX AIR GLUCOSE METER) w/Device KIT Test BS daily and as needed Dx E11.9 1 kit 0   Calcium Carb-Cholecalciferol (CALCIUM-VITAMIN D) 600-400 MG-UNIT TABS Take 1 tablet by mouth daily.     celecoxib (CELEBREX) 200 MG capsule TAKE 1 CAPSULE EVERY DAY WITH FOOD 90 capsule 3   clopidogrel (PLAVIX) 75 MG tablet Take 1 tablet (75 mg total) by mouth daily. 90 tablet 3   Coenzyme Q10 (COQ10) 100 MG CAPS Take 100 mg by mouth daily.     Cranberry 425 MG CAPS Take 425 mg by mouth in the morning and at bedtime.     diphenhydramine-acetaminophen (TYLENOL PM) 25-500 MG TABS tablet Take 1 tablet by mouth at bedtime as needed. 60 tablet    Dulaglutide (TRULICITY) 1.5 NO/0.3BC SOPN Inject 1.5 mg into the skin once a week. 6 mL 4   Flaxseed, Linseed, (FLAXSEED OIL) 1000 MG CAPS Take 1,000 mg by mouth in the morning and at bedtime.     glucose blood (TRUE METRIX  BLOOD GLUCOSE TEST) test strip Test BS daily and as needed Dx E11.9 100 each 3   Krill Oil 500 MG CAPS Take 3 capsules (1,500 mg total) by mouth in the morning and at bedtime.     metoprolol (TOPROL-XL) 200 MG 24 hr tablet TAKE 1 TABLET ONE TIME DAILY, WITH OR IMMEDIATELY FOLLOWING A MEAL 90 tablet 3   Multiple Vitamin (MULTIVITAMIN) capsule Take 1 capsule by mouth daily.     pantoprazole (PROTONIX) 40 MG tablet TAKE 1 TABLET EVERY DAY FOR STOMACH 90 tablet 3   traZODone (DESYREL) 150 MG tablet TAKE 1 OR 2 TABLETS AT  BEDTIME FOR SLEEP 180 tablet 3   TRUEplus Lancets 33G MISC Test BS daily and as needed Dx E11.9 100 each 3   valsartan-hydrochlorothiazide (DIOVAN-HCT) 320-25 MG tablet Take 1 tablet by mouth daily. 90 tablet 3   zinc gluconate 50 MG tablet Take 50 mg by mouth daily.     No current facility-administered medications on file prior to visit.    ROS Review of Systems  Constitutional: Negative.   HENT: Negative.    Eyes:  Negative for visual disturbance.  Respiratory:  Negative for shortness of breath.   Cardiovascular:  Negative for chest pain.  Gastrointestinal:  Negative for abdominal pain.  Musculoskeletal:  Negative for arthralgias.   Objective:  BP 115/78    Pulse 81    Temp 97.7 F (36.5 C)    Ht _0  (1.575 m)    Wt 168 lb 9.6 oz (76.5 kg)    SpO2 97%    BMI 30.84 kg/m   BP Readings from Last 3 Encounters:  05/25/21 115/78  02/20/21 124/89  11/10/20 121/67    Wt Readings from Last 3 Encounters:  05/25/21 168 lb 9.6 oz (76.5 kg)  02/20/21 168 lb 9.6 oz (76.5 kg)  11/10/20 170 lb (77.1 kg)     Physical Exam Constitutional:      General: She is not in acute distress.    Appearance: She is well-developed.  Cardiovascular:     Rate and Rhythm: Normal rate and regular rhythm.  Pulmonary:     Breath sounds: Normal breath sounds.  Musculoskeletal:        General: Normal range of motion.  Skin:    General: Skin is warm and dry.  Neurological:     Mental Status: She is alert and oriented to person, place, and time.    Diabetic Foot Exam - Simple   No data filed       Assessment & Plan:   Alayne was seen today for medical management of chronic issues.  Diagnoses and all orders for this visit:  Controlled type 2 diabetes mellitus with complication, without long-term current use of insulin ([place]) -     Bayer DCA Hb A1c Waived  Primary hypertension -     CBC with Differential/Platelet -     CMP14+EGFR  Mixed hyperlipidemia -     Lipid panel   I am  having Decarla T. Dubiel maintain her multivitamin, aspirin, Flaxseed Oil, Apple Cider Vinegar, Calcium-Vitamin D, zinc gluconate, CoQ10, Cranberry, amLODipine, atorvastatin, celecoxib, clopidogrel, metoprolol, pantoprazole, traZODone, valsartan-hydrochlorothiazide, Krill Oil, diphenhydramine-acetaminophen, True Metrix Air Glucose Meter, True Metrix Blood Glucose Test, TRUEplus Lancets 33G, True Metrix Level 1, B-D SINGLE USE SWABS REGULAR, and Trulicity.  No orders of the defined types were placed in this encounter.    Follow-up: Return in about 3 months (around 08/23/2021).  Claretta Fraise, M.D.

## 2021-05-26 ENCOUNTER — Other Ambulatory Visit: Payer: Self-pay | Admitting: Family Medicine

## 2021-05-26 MED ORDER — ICOSAPENT ETHYL 1 G PO CAPS: 2.0000 g | ORAL_CAPSULE | Freq: Two times a day (BID) | ORAL | 3 refills | Status: DC

## 2021-06-01 ENCOUNTER — Ambulatory Visit: Payer: No Typology Code available for payment source | Admitting: General Surgery

## 2021-06-27 ENCOUNTER — Telehealth: Payer: Medicare HMO

## 2021-07-21 ENCOUNTER — Ambulatory Visit (INDEPENDENT_AMBULATORY_CARE_PROVIDER_SITE_OTHER): Payer: No Typology Code available for payment source | Admitting: Pharmacist

## 2021-07-21 DIAGNOSIS — E118 Type 2 diabetes mellitus with unspecified complications: Secondary | ICD-10-CM

## 2021-07-21 DIAGNOSIS — I10 Essential (primary) hypertension: Secondary | ICD-10-CM

## 2021-07-21 MED ORDER — TRULICITY 1.5 MG/0.5ML ~~LOC~~ SOAJ: 1.5000 mg | SUBCUTANEOUS | 4 refills | Status: DC

## 2021-07-21 NOTE — Patient Instructions (Signed)
Visit Information ? ?Following are the goals we discussed today:  ?Current Barriers:  ?Unable to independently afford treatment regimen ?Unable to maintain control of TD2M/WEIGHT LOSS ? ?Pharmacist Clinical Goal(s):  ?Over the next 90 days, patient will verbalize ability to afford treatment regimen ?maintain control of T2DM/WEIGHT LOSS as evidenced by WEIGHT LOSS/CONTINUED [place]  through collaboration with PharmD and provider.  ? ?Interventions: ?1:1 collaboration with Claretta Fraise, MD regarding development and update of comprehensive plan of care as evidenced by provider attestation and co-signature ?Inter-disciplinary care team collaboration (see longitudinal plan of care) ?Comprehensive medication review performed; medication list updated in electronic medical record ? ?Diabetes: ?Controlled; current treatment: TRULICITY 1.'5mg'$  sq weekly;  ?PATIENT WOULD LIKE TO BE ON TRULICITY FOR CONTINUED GLYCEMIC CONTROL, CARDIO PROTECTION & WEIGHT LOSS ?WILL CONTINUE TRULICITY 1.'5MG'$  SQ WEEKLY (NAUSEA RESOLVED; WON'T PUSH DOSE) ?Denies personal and family history of Medullary thyroid cancer (MTC) ?A1C 6.7%, GFR 78 ?Current glucose readings: fasting glucose: <135, post prandial glucose: <180 ?Denies hypoglycemic/hyperglycemic symptoms ?Discussed meal planning options and Plate method for healthy eating ?Avoid sugary drinks and desserts ?Incorporate balanced protein, non starchy veggies, 1 serving of carbohydrate with each meal ?Increase water intake ?Increase physical activity as able ?Current exercise: N/A ?Educated on [place], reviewed meds for hypertension/BP controlled ?Assessed patient finances. ENROLLED FOR 2023 [facility] (trulicity to ship to patient's home) ? ?Patient Goals/Self-Care Activities ?Over the next 90 days, patient will:  ?- take medications as prescribed ?WORK TO LOSE WEIGHT AND CONTINUE GLYCEMIC CONTROL ? ?Follow Up Plan:  Telephone follow up appointment with care management team member scheduled for: 3 MONTHS ? ? ?Plan: Telephone follow up appointment with care management team member scheduled for:  3 MONTHS ? ?Signature ?Regina Eck, PharmD, BCPS ?Clinical Pharmacist, [facility] ?[place]  II Phone [phone] ? ? ?Please call the care guide team at [phone] if you need to cancel or reschedule your appointment.  ? ?Patient verbalizes understanding of instructions and care plan provided today and agrees to view in [place]. Active MyChart status confirmed with patient.   ? ?

## 2021-07-21 NOTE — Progress Notes (Signed)
? ? ?Chronic Care Management ?Pharmacy Note ? ?07/21/2021 ?Name:  Marilyn Ford MRN:  [id number] DOB:  11/21/1960 ? ?Summary:T2DM ? ?Recommendations/Changes made from today's visit: ?Diabetes: ?Controlled; current treatment: TRULICITY 1.5mg  sq weekly;  ?PATIENT WOULD LIKE TO BE ON TRULICITY FOR CONTINUED GLYCEMIC CONTROL, CARDIO PROTECTION & WEIGHT LOSS ?WILL CONTINUE TRULICITY 1.5MG  SQ WEEKLY (NAUSEA RESOLVED; WON'T PUSH DOSE) ?Denies personal and family history of Medullary thyroid cancer (MTC) ?A1C 6.7%, GFR 78 ?Current glucose readings: fasting glucose: <135, post prandial glucose: <180 ?Denies hypoglycemic/hyperglycemic symptoms ?Discussed meal planning options and Plate method for healthy eating ?Avoid sugary drinks and desserts ?Incorporate balanced protein, non starchy veggies, 1 serving of carbohydrate with each meal ?Increase water intake ?Increase physical activity as able ?Current exercise: N/A ?Educated on [place], reviewed meds for hypertension/BP controlled ?Assessed patient finances. ENROLLED FOR 2023 [facility] (trulicity to ship to patient's home) ? ?Subjective: ?Marilyn Ford is an 61 y.o. year old female who is a primary patient of Stacks, Cletus Gash, MD.  The CCM team was consulted for assistance with disease management and care coordination needs.   ? ?Engaged with patient by telephone for follow up visit in response to provider referral for pharmacy case management and/or care coordination services.  ? ?Consent to Services:  ?The patient was given information about Chronic Care Management services, agreed to services, and gave verbal consent prior to initiation of services.  Please see initial visit note for detailed documentation.  ? ?Patient Care Team: ?Claretta Fraise, MD as PCP - General (Family Medicine) ?Claretta Fraise, MD (Family Medicine) ?Lavera Guise, [facility] as Pharmacist (Family Medicine) ? ?Objective: ? ?Lab Results  ?Component Value Date  ?  CREATININE 0.93 05/25/2021  ? CREATININE 0.97 02/20/2021  ? CREATININE 0.86 11/10/2020  ? ? ?Lab Results  ?Component Value Date  ? HGBA1C 6.7 (H) 05/25/2021  ? ?Last diabetic Eye exam:  ?Lab Results  ?Component Value Date/Time  ? HMDIABEYEEXA No Retinopathy 10/28/2020 12:00 AM  ?  ?Last diabetic Foot exam: No results found for: HMDIABFOOTEX  ? ?   ?Component Value Date/Time  ? CHOL 150 05/25/2021 0908  ? TRIG 359 (H) 05/25/2021 0908  ? HDL 38 (L) 05/25/2021 0908  ? CHOLHDL 3.9 05/25/2021 0908  ? CHOLHDL 7.7 03/13/2015 0438  ? VLDL UNABLE TO CALCULATE IF TRIGLYCERIDE OVER 400 mg/dL 03/13/2015 0438  ? [place] 57 05/25/2021 0908  ? ? ?Hepatic Function Latest Ref Rng & Units 05/25/2021 02/20/2021 11/10/2020  ?Total Protein 6.0 - 8.5 g/dL 7.2 6.9 6.7  ?Albumin 3.8 - 4.9 g/dL 4.5 4.5 4.3  ?AST 0 - 40 IU/L 25 35 30  ?ALT 0 - 32 IU/L 34(H) 39(H) 36(H)  ?Alk Phosphatase 44 - 121 IU/L 109 94 80  ?Total Bilirubin 0.0 - 1.2 mg/dL 0.5 0.5 0.4  ?Bilirubin, Direct 0.00 - 0.40 mg/dL - - -  ? ? ?No results found for: TSH, FREET4 ? ?CBC Latest Ref Rng & Units 05/25/2021 02/20/2021 11/10/2020  ?WBC 3.4 - 10.8 x10E3/uL 7.6 8.7 9.5  ?Hemoglobin 11.1 - 15.9 g/dL 13.9 14.7 13.6  ?Hematocrit 34.0 - 46.6 % 41.7 42.5 39.4  ?Platelets 150 - 450 x10E3/uL 247 241 240  ? ? ?No results found for: VD25OH ? ?Clinical ASCVD: No  ?The 10-year ASCVD risk score (Arnett DK, et al., 2019) is: 7% ?  Values used to calculate the score: ?    Age: 65 years ?    Sex: Female ?    Is Non-Hispanic  African American: No ?    Diabetic: Yes ?    Tobacco smoker: No ?    Systolic Blood Pressure: 644 mmHg ?    Is BP treated: Yes ?    HDL Cholesterol: 38 mg/dL ?    Total Cholesterol: 150 mg/dL   ? ?Other: (CHADS2VASc if Afib, PHQ9 if depression, MMRC or CAT for COPD, ACT, DEXA) ? ?Social History  ? ?Tobacco Use  ?Smoking Status Former  ? Packs/day: 0.50  ? Types: Cigarettes  ? Start date: 12/08/1999  ? Quit date: 03/12/2015  ? Years since quitting: 6.3  ?Smokeless Tobacco Never   ? ?BP Readings from Last 3 Encounters:  ?05/25/21 115/78  ?02/20/21 124/89  ?11/10/20 121/67  ? ?Pulse Readings from Last 3 Encounters:  ?05/25/21 81  ?02/20/21 84  ?11/10/20 71  ? ?Wt Readings from Last 3 Encounters:  ?05/25/21 168 lb 9.6 oz (76.5 kg)  ?02/20/21 168 lb 9.6 oz (76.5 kg)  ?11/10/20 170 lb (77.1 kg)  ? ? ?Assessment: Review of patient past medical history, allergies, medications, health status, including review of consultants reports, laboratory and other test data, was performed as part of comprehensive evaluation and provision of chronic care management services.  ? ?SDOH:  (Social Determinants of Health) assessments and interventions performed:  ? ? ?CCM Care Plan ? ?Allergies  ?Allergen Reactions  ? Penicillins Shortness Of Breath  ? ? ?Medications Reviewed Today   ? ? Reviewed by Claretta Fraise, MD (Physician) on 05/25/21 at [place] List Status: <None>  ? ?Medication Order Taking? Sig Documenting Provider Last Dose Status Informant  ?Alcohol Swabs (B-D SINGLE USE SWABS REGULAR) PADS [id number] Yes Test BS daily and as needed Dx E11.9 Claretta Fraise, MD Taking Active   ?amLODipine (NORVASC) 10 MG tablet [id number] Yes 1 tablet daily Claretta Fraise, MD Taking Active   ?Apple Cider Vinegar 500 MG TABS [id number] Yes Take 500 mg by mouth daily. [provider] Taking Active Self  ?aspirin 81 MG EC tablet [id number] Yes Take 1 tablet (81 mg total) by mouth daily. Claretta Fraise, MD Taking Active Self  ?atorvastatin (LIPITOR) 80 MG tablet [id number] Yes TAKE 1 TABLET EVERY DAY AT Nelva Bush, MD Taking Active   ?Blood Glucose Calibration (TRUE METRIX LEVEL 1) Low SOLN [id number] Yes Use with glucometer Dx E11.9 Claretta Fraise, MD Taking Active   ?Blood Glucose Monitoring Suppl (TRUE METRIX AIR GLUCOSE METER) w/Device KIT [id number] Yes Test BS daily and as needed Dx E11.9 Claretta Fraise, MD Taking Active   ?Calcium Carb-Cholecalciferol (CALCIUM-VITAMIN D) 600-400 MG-UNIT TABS [id number] Yes  Take 1 tablet by mouth daily. [provider] Taking Active Self  ?celecoxib (CELEBREX) 200 MG capsule [id number] Yes TAKE 1 CAPSULE EVERY DAY WITH FOOD Claretta Fraise, MD Taking Active   ?clopidogrel (PLAVIX) 75 MG tablet [id number] Yes Take 1 tablet (75 mg total) by mouth daily. Claretta Fraise, MD Taking Active   ?Coenzyme Q10 (COQ10) 100 MG CAPS [id number] Yes Take 100 mg by mouth daily. [provider] Taking Active Self  ?Cranberry 425 MG CAPS [id number] Yes Take 425 mg by mouth in the morning and at bedtime. [provider] Taking Active Self  ?diphenhydramine-acetaminophen (TYLENOL PM) 25-500 MG TABS tablet [id number] Yes Take 1 tablet by mouth at bedtime as needed. Claretta Fraise, MD Taking Active   ?Dulaglutide (TRULICITY) 1.5 CH/8.5ID SOPN [id number] Yes Inject 1.5 mg into the skin once a week. Claretta Fraise, MD Taking Active   ?         ?  Med Note Blanca Friend, Daleyssa Loiselle D   Wed Feb 01, 2021  2:40 PM) [facility]  ?Flaxseed, Linseed, (FLAXSEED OIL) 1000 MG CAPS [id number] Yes Take 1,000 mg by mouth in the morning and at bedtime. [provider] Taking Active Self  ?glucose blood (TRUE METRIX BLOOD GLUCOSE TEST) test strip [id number] Yes Test BS daily and as needed Dx E11.9 Claretta Fraise, MD Taking Active   ?Krill Oil 500 MG CAPS [id number] Yes Take 3 capsules (1,500 mg total) by mouth in the morning and at bedtime. Claretta Fraise, MD Taking Active   ?metoprolol (TOPROL-XL) 200 MG 24 hr tablet [id number] Yes TAKE 1 TABLET ONE TIME DAILY, WITH OR IMMEDIATELY FOLLOWING A MEAL Claretta Fraise, MD Taking Active   ?Multiple Vitamin (MULTIVITAMIN) capsule [id number] Yes Take 1 capsule by mouth daily. [provider] Taking Active Self  ?pantoprazole (PROTONIX) 40 MG tablet [id number] Yes TAKE 1 TABLET EVERY DAY FOR STOMACH Stacks, Warren, MD Taking Active   ?traZODone (DESYREL) 150 MG tablet [id number] Yes TAKE 1 OR 2 TABLETS AT BEDTIME FOR SLEEP Claretta Fraise, MD  Taking Active   ?TRUEplus Lancets 33G MISC [id number] Yes Test BS daily and as needed Dx E11.9 Claretta Fraise, MD Taking Active   ?valsartan-hydrochlorothiazide (DIOVAN-HCT) 320-25 MG tablet [id number]

## 2021-08-10 ENCOUNTER — Other Ambulatory Visit: Payer: Self-pay

## 2021-08-10 ENCOUNTER — Telehealth: Payer: Self-pay | Admitting: Family Medicine

## 2021-08-10 ENCOUNTER — Other Ambulatory Visit (HOSPITAL_COMMUNITY): Payer: Self-pay | Admitting: General Surgery

## 2021-08-10 ENCOUNTER — Encounter: Payer: Self-pay | Admitting: General Surgery

## 2021-08-10 ENCOUNTER — Ambulatory Visit (INDEPENDENT_AMBULATORY_CARE_PROVIDER_SITE_OTHER): Payer: No Typology Code available for payment source | Admitting: General Surgery

## 2021-08-10 VITALS — BP 139/84 | HR 77 | Temp 97.8°F | Resp 14 | Ht 62.0 in | Wt 168.0 lb

## 2021-08-10 DIAGNOSIS — R928 Other abnormal and inconclusive findings on diagnostic imaging of breast: Secondary | ICD-10-CM

## 2021-08-10 DIAGNOSIS — R920 Mammographic microcalcification found on diagnostic imaging of breast: Secondary | ICD-10-CM | POA: Diagnosis not present

## 2021-08-10 NOTE — Telephone Encounter (Signed)
Patient aware.

## 2021-08-10 NOTE — Telephone Encounter (Signed)
She can stop taking it 3 days prior to oral surgery.  ?

## 2021-08-10 NOTE — H&P (Signed)
Marilyn Ford; [id number]; 1960/09/04 ? ? ?HPI ?Patient is a 61 year old white female who was referred to my care by Dr. Livia Snellen in the breast center for a right breast biopsy.  Patient underwent core biopsy of the right breast for microcalcifications.  Final pathology was negative for malignancy, but this was discordant with the mammogram findings.  The patient now presents for a formal excisional biopsy.  Patient denies any nipple discharge.  She denies any family history of breast cancer. ?Past Medical History:  ?Diagnosis Date  ? Diabetes mellitus without complication ([place])   ? Hypertension   ? Stroke [facility])   ? Vaginal Pap smear, abnormal   ? ? ?Past Surgical History:  ?Procedure Laterality Date  ? CESAREAN SECTION    ? CHOLECYSTECTOMY    ? COLONOSCOPY WITH PROPOFOL N/A 07/19/2020  ? Procedure: COLONOSCOPY WITH PROPOFOL;  Surgeon: Harvel Quale, MD;  [facility];  Service: Gastroenterology;  Laterality: N/A;  AM  ? POLYPECTOMY  07/19/2020  ? Procedure: POLYPECTOMY;  Surgeon: Harvel Quale, MD;  [facility];  Service: Gastroenterology;;  ? ? ?Family History  ?Problem Relation Age of Onset  ? Diabetes Mother   ? Diabetes Brother   ? Breast cancer Neg Hx   ? ? ?Current Outpatient Medications on File Prior to Visit  ?Medication Sig Dispense Refill  ? Alcohol Swabs (B-D SINGLE USE SWABS REGULAR) PADS Test BS daily and as needed Dx E11.9 100 each 3  ? amLODipine (NORVASC) 10 MG tablet 1 tablet daily 90 tablet 3  ? Apple Cider Vinegar 500 MG TABS Take 500 mg by mouth daily.    ? aspirin 81 MG EC tablet Take 1 tablet (81 mg total) by mouth daily. 30 tablet 0  ? atorvastatin (LIPITOR) 80 MG tablet TAKE 1 TABLET EVERY DAY AT 6PM 90 tablet 3  ? Blood Glucose Calibration (TRUE METRIX LEVEL 1) Low SOLN Use with glucometer Dx E11.9 3 each 0  ? Blood Glucose Monitoring Suppl (TRUE METRIX AIR GLUCOSE METER) w/Device KIT Test BS daily and as needed Dx E11.9 1 kit 0  ? Calcium  Carb-Cholecalciferol (CALCIUM-VITAMIN D) 600-400 MG-UNIT TABS Take 1 tablet by mouth daily.    ? celecoxib (CELEBREX) 200 MG capsule TAKE 1 CAPSULE EVERY DAY WITH FOOD 90 capsule 3  ? clopidogrel (PLAVIX) 75 MG tablet Take 1 tablet (75 mg total) by mouth daily. 90 tablet 3  ? Coenzyme Q10 (COQ10) 100 MG CAPS Take 100 mg by mouth daily.    ? Cranberry 425 MG CAPS Take 425 mg by mouth in the morning and at bedtime.    ? diphenhydramine-acetaminophen (TYLENOL PM) 25-500 MG TABS tablet Take 1 tablet by mouth at bedtime as needed. 60 tablet   ? Dulaglutide (TRULICITY) 1.5 QQ/2.2LN SOPN Inject 1.5 mg into the skin once a week. 6 mL 4  ? Flaxseed, Linseed, (FLAXSEED OIL) 1000 MG CAPS Take 1,000 mg by mouth in the morning and at bedtime.    ? glucose blood (TRUE METRIX BLOOD GLUCOSE TEST) test strip Test BS daily and as needed Dx E11.9 100 each 3  ? Krill Oil 500 MG CAPS Take 3 capsules (1,500 mg total) by mouth in the morning and at bedtime.    ? metoprolol (TOPROL-XL) 200 MG 24 hr tablet TAKE 1 TABLET ONE TIME DAILY, WITH OR IMMEDIATELY FOLLOWING A MEAL 90 tablet 3  ? Multiple Vitamin (MULTIVITAMIN) capsule Take 1 capsule by mouth daily.    ? pantoprazole (PROTONIX)  40 MG tablet TAKE 1 TABLET EVERY DAY FOR STOMACH 90 tablet 3  ? traZODone (DESYREL) 150 MG tablet TAKE 1 OR 2 TABLETS AT BEDTIME FOR SLEEP 180 tablet 3  ? TRUEplus Lancets 33G MISC Test BS daily and as needed Dx E11.9 100 each 3  ? valsartan-hydrochlorothiazide (DIOVAN-HCT) 320-25 MG tablet Take 1 tablet by mouth daily. 90 tablet 3  ? zinc gluconate 50 MG tablet Take 50 mg by mouth daily.    ? ?No current facility-administered medications on file prior to visit.  ? ? ?Allergies  ?Allergen Reactions  ? Penicillins Shortness Of Breath  ? ? ?Social History  ? ?Substance and Sexual Activity  ?Alcohol Use Yes  ? Alcohol/week: 1.0 standard drink  ? Types: 1 Glasses of wine per week  ? ? ?Social History  ? ?Tobacco Use  ?Smoking Status Former  ? Packs/day: 0.50  ?  Types: Cigarettes  ? Start date: 12/08/1999  ? Quit date: 03/12/2015  ? Years since quitting: 6.4  ?Smokeless Tobacco Never  ? ? ?Review of Systems  ?Constitutional:  Positive for malaise/fatigue.  ?HENT: Negative.    ?Eyes:  Positive for blurred vision.  ?Respiratory: Negative.    ?Cardiovascular: Negative.   ?Gastrointestinal:  Positive for heartburn.  ?Genitourinary: Negative.   ?Musculoskeletal: Negative.   ?Skin: Negative.   ?Neurological: Negative.   ?Endo/Heme/Allergies: Negative.   ?Psychiatric/Behavioral: Negative.    ? ?Objective  ? ?Vitals:  ? 08/10/21 0852  ?BP: 139/84  ?Pulse: 77  ?Resp: 14  ?Temp: 97.8 ?F (36.6 ?C)  ?SpO2: 97%  ? ? ?Physical Exam ?Vitals reviewed.  ?Constitutional:   ?   Appearance: Normal appearance. She is not ill-appearing.  ?HENT:  ?   Head: Normocephalic and atraumatic.  ?Cardiovascular:  ?   Rate and Rhythm: Normal rate and regular rhythm.  ?   Heart sounds: Normal heart sounds. No murmur heard. ?  No friction rub. No gallop.  ?Pulmonary:  ?   Effort: Pulmonary effort is normal. No respiratory distress.  ?   Breath sounds: Normal breath sounds. No stridor. No wheezing, rhonchi or rales.  ?Skin: ?   General: Skin is warm and dry.  ?Neurological:  ?   Mental Status: She is alert and oriented to person, place, and time.  ?Breast: A small tender nodules noted just inferior and medial to the right axilla.  Patient states this is the area that was biopsied.  No nipple discharge or dimpling are noted.  No other right axillary lymphadenopathy noted.  Left breast examination unremarkable.  No dominant mass, nipple discharge, or dimpling noted.  The left axilla is negative for palpable nodes. ? ?Breast center notes reviewed ? ?Assessment  ?Right breast microcalcifications, question swollen axillary lymph node. ?Plan  ?Patient will be scheduled for right breast biopsy after tag placement.  Patient should hold her Plavix 1 week prior to the procedure.  The risks and benefits of the procedure  including bleeding, infection, and the possibility of cancer were fully explained to the patient, who gave informed consent. ?

## 2021-08-10 NOTE — Progress Notes (Addendum)
Marilyn Ford; [id number]; 18-May-1960 ? ? ?HPI ?Patient is a 61 year old white female who was referred to my care by Dr. Livia Snellen in the breast center for a right breast biopsy.  Patient underwent core biopsy of the right breast for microcalcifications.  Final pathology was negative for malignancy, but this was discordant with the mammogram findings.  The patient now presents for a formal excisional biopsy.  Patient denies any nipple discharge.  She denies any family history of breast cancer. ?Past Medical History:  ?Diagnosis Date  ? Diabetes mellitus without complication ([place])   ? Hypertension   ? Stroke [facility])   ? Vaginal Pap smear, abnormal   ? ? ?Past Surgical History:  ?Procedure Laterality Date  ? CESAREAN SECTION    ? CHOLECYSTECTOMY    ? COLONOSCOPY WITH PROPOFOL N/A 07/19/2020  ? Procedure: COLONOSCOPY WITH PROPOFOL;  Surgeon: Harvel Quale, MD;  [facility];  Service: Gastroenterology;  Laterality: N/A;  AM  ? POLYPECTOMY  07/19/2020  ? Procedure: POLYPECTOMY;  Surgeon: Harvel Quale, MD;  [facility];  Service: Gastroenterology;;  ? ? ?Family History  ?Problem Relation Age of Onset  ? Diabetes Mother   ? Diabetes Brother   ? Breast cancer Neg Hx   ? ? ?Current Outpatient Medications on File Prior to Visit  ?Medication Sig Dispense Refill  ? Alcohol Swabs (B-D SINGLE USE SWABS REGULAR) PADS Test BS daily and as needed Dx E11.9 100 each 3  ? amLODipine (NORVASC) 10 MG tablet 1 tablet daily 90 tablet 3  ? Apple Cider Vinegar 500 MG TABS Take 500 mg by mouth daily.    ? aspirin 81 MG EC tablet Take 1 tablet (81 mg total) by mouth daily. 30 tablet 0  ? atorvastatin (LIPITOR) 80 MG tablet TAKE 1 TABLET EVERY DAY AT 6PM 90 tablet 3  ? Blood Glucose Calibration (TRUE METRIX LEVEL 1) Low SOLN Use with glucometer Dx E11.9 3 each 0  ? Blood Glucose Monitoring Suppl (TRUE METRIX AIR GLUCOSE METER) w/Device KIT Test BS daily and as needed Dx E11.9 1 kit 0  ? Calcium  Carb-Cholecalciferol (CALCIUM-VITAMIN D) 600-400 MG-UNIT TABS Take 1 tablet by mouth daily.    ? celecoxib (CELEBREX) 200 MG capsule TAKE 1 CAPSULE EVERY DAY WITH FOOD 90 capsule 3  ? clopidogrel (PLAVIX) 75 MG tablet Take 1 tablet (75 mg total) by mouth daily. 90 tablet 3  ? Coenzyme Q10 (COQ10) 100 MG CAPS Take 100 mg by mouth daily.    ? Cranberry 425 MG CAPS Take 425 mg by mouth in the morning and at bedtime.    ? diphenhydramine-acetaminophen (TYLENOL PM) 25-500 MG TABS tablet Take 1 tablet by mouth at bedtime as needed. 60 tablet   ? Dulaglutide (TRULICITY) 1.5 ZD/6.6YQ SOPN Inject 1.5 mg into the skin once a week. 6 mL 4  ? Flaxseed, Linseed, (FLAXSEED OIL) 1000 MG CAPS Take 1,000 mg by mouth in the morning and at bedtime.    ? glucose blood (TRUE METRIX BLOOD GLUCOSE TEST) test strip Test BS daily and as needed Dx E11.9 100 each 3  ? Krill Oil 500 MG CAPS Take 3 capsules (1,500 mg total) by mouth in the morning and at bedtime.    ? metoprolol (TOPROL-XL) 200 MG 24 hr tablet TAKE 1 TABLET ONE TIME DAILY, WITH OR IMMEDIATELY FOLLOWING A MEAL 90 tablet 3  ? Multiple Vitamin (MULTIVITAMIN) capsule Take 1 capsule by mouth daily.    ? pantoprazole (PROTONIX)  40 MG tablet TAKE 1 TABLET EVERY DAY FOR STOMACH 90 tablet 3  ? traZODone (DESYREL) 150 MG tablet TAKE 1 OR 2 TABLETS AT BEDTIME FOR SLEEP 180 tablet 3  ? TRUEplus Lancets 33G MISC Test BS daily and as needed Dx E11.9 100 each 3  ? valsartan-hydrochlorothiazide (DIOVAN-HCT) 320-25 MG tablet Take 1 tablet by mouth daily. 90 tablet 3  ? zinc gluconate 50 MG tablet Take 50 mg by mouth daily.    ? ?No current facility-administered medications on file prior to visit.  ? ? ?Allergies  ?Allergen Reactions  ? Penicillins Shortness Of Breath  ? ? ?Social History  ? ?Substance and Sexual Activity  ?Alcohol Use Yes  ? Alcohol/week: 1.0 standard drink  ? Types: 1 Glasses of wine per week  ? ? ?Social History  ? ?Tobacco Use  ?Smoking Status Former  ? Packs/day: 0.50  ?  Types: Cigarettes  ? Start date: 12/08/1999  ? Quit date: 03/12/2015  ? Years since quitting: 6.4  ?Smokeless Tobacco Never  ? ? ?Review of Systems  ?Constitutional:  Positive for malaise/fatigue.  ?HENT: Negative.    ?Eyes:  Positive for blurred vision.  ?Respiratory: Negative.    ?Cardiovascular: Negative.   ?Gastrointestinal:  Positive for heartburn.  ?Genitourinary: Negative.   ?Musculoskeletal: Negative.   ?Skin: Negative.   ?Neurological: Negative.   ?Endo/Heme/Allergies: Negative.   ?Psychiatric/Behavioral: Negative.    ? ?Objective  ? ?Vitals:  ? 08/10/21 0852  ?BP: 139/84  ?Pulse: 77  ?Resp: 14  ?Temp: 97.8 ?F (36.6 ?C)  ?SpO2: 97%  ? ? ?Physical Exam ?Vitals reviewed. Exam conducted with a chaperone present.  ?Constitutional:   ?   Appearance: Normal appearance. She is not ill-appearing.  ?HENT:  ?   Head: Normocephalic and atraumatic.  ?Cardiovascular:  ?   Rate and Rhythm: Normal rate and regular rhythm.  ?   Heart sounds: Normal heart sounds. No murmur heard. ?  No friction rub. No gallop.  ?Pulmonary:  ?   Effort: Pulmonary effort is normal. No respiratory distress.  ?   Breath sounds: Normal breath sounds. No stridor. No wheezing, rhonchi or rales.  ?Skin: ?   General: Skin is warm and dry.  ?Neurological:  ?   Mental Status: She is alert and oriented to person, place, and time.  ?Breast: A small tender nodules noted just inferior and medial to the right axilla.  Patient states this is the area that was biopsied.  No nipple discharge or dimpling are noted.  No other right axillary lymphadenopathy noted.  Left breast examination unremarkable.  No dominant mass, nipple discharge, or dimpling noted.  The left axilla is negative for palpable nodes. ? ?Breast center notes reviewed ? ?Assessment  ?Right breast microcalcifications, question swollen axillary lymph node. ?Plan  ?Patient will be scheduled for right breast biopsy after tag placement.  Patient should hold her Plavix 1 week prior to the procedure.   The risks and benefits of the procedure including bleeding, infection, and the possibility of cancer were fully explained to the patient, who gave informed consent. ?

## 2021-08-10 NOTE — Telephone Encounter (Signed)
Attempted to contact patient, unable to leave VM.  ?

## 2021-08-17 ENCOUNTER — Telehealth: Payer: Self-pay

## 2021-08-17 ENCOUNTER — Ambulatory Visit (INDEPENDENT_AMBULATORY_CARE_PROVIDER_SITE_OTHER): Payer: No Typology Code available for payment source

## 2021-08-17 VITALS — Wt 168.0 lb

## 2021-08-17 DIAGNOSIS — Z599 Problem related to housing and economic circumstances, unspecified: Secondary | ICD-10-CM | POA: Diagnosis not present

## 2021-08-17 DIAGNOSIS — Z01419 Encounter for gynecological examination (general) (routine) without abnormal findings: Secondary | ICD-10-CM

## 2021-08-17 DIAGNOSIS — Z0000 Encounter for general adult medical examination without abnormal findings: Secondary | ICD-10-CM

## 2021-08-17 NOTE — Progress Notes (Signed)
? ?Subjective:  ? Marilyn Ford is a 61 y.o. female who presents for Medicare Annual (Subsequent) preventive examination. ? ?Virtual Visit via Telephone Note ? ?I connected with  Marilyn Ford on 08/17/21 at 12:00 PM EDT by telephone and verified that I am speaking with the correct person using two identifiers. ? ?Location: ?Patient: Home ?Provider: [facility] ?Persons participating in the virtual visit: patient/Nurse Health Advisor ?  ?I discussed the limitations, risks, security and privacy concerns of performing an evaluation and management service by telephone and the availability of in person appointments. The patient expressed understanding and agreed to proceed. ? ?Interactive audio and video telecommunications were attempted between this nurse and patient, however failed, due to patient having technical difficulties OR patient did not have access to video capability.  We continued and completed visit with audio only. ? ?Some vital signs may be absent or patient reported.  ? ?Sally-Anne Wamble Dionne Ano, LPN  ? ?Review of Systems    ? ?Cardiac Risk Factors include: diabetes mellitus;dyslipidemia;hypertension;obesity (BMI >30kg/m2);sedentary lifestyle;Other (see comment), Risk factor comments: hx of TIA ? ?   ?Objective:  ?  ?Today's Vitals  ? 08/17/21 1231  ?Weight: 168 lb (76.2 kg)  ?PainSc: 5   ? ?Body mass index is 30.73 kg/m?. ? ? ?  08/17/2021  ?  1:06 PM 07/19/2020  ?  7:54 AM 01/02/2019  ?  8:48 AM 03/12/2015  ?  9:42 AM  ?Advanced Directives  ?Does Patient Have a Medical Advance Directive? No No No No  ?Would patient like information on creating a medical advance directive? No - Patient declined  No - Patient declined No - patient declined information  ? ? ?Current Medications (verified) ?Outpatient Encounter Medications as of 08/17/2021  ?Medication Sig  ? Alcohol Swabs (B-D SINGLE USE SWABS REGULAR) PADS Test BS daily and as needed Dx E11.9  ? amLODipine (NORVASC) 10 MG tablet 1 tablet daily  ? Apple Cider Vinegar  500 MG TABS Take 500 mg by mouth daily.  ? aspirin 81 MG EC tablet Take 1 tablet (81 mg total) by mouth daily.  ? atorvastatin (LIPITOR) 80 MG tablet TAKE 1 TABLET EVERY DAY AT 6PM  ? Blood Glucose Calibration (TRUE METRIX LEVEL 1) Low SOLN Use with glucometer Dx E11.9  ? Blood Glucose Monitoring Suppl (TRUE METRIX AIR GLUCOSE METER) w/Device KIT Test BS daily and as needed Dx E11.9  ? Calcium Carb-Cholecalciferol (CALCIUM-VITAMIN D) 600-400 MG-UNIT TABS Take 1 tablet by mouth daily.  ? celecoxib (CELEBREX) 200 MG capsule TAKE 1 CAPSULE EVERY DAY WITH FOOD  ? clopidogrel (PLAVIX) 75 MG tablet Take 1 tablet (75 mg total) by mouth daily.  ? Coenzyme Q10 (COQ10) 100 MG CAPS Take 100 mg by mouth daily.  ? Cranberry 425 MG CAPS Take 425 mg by mouth in the morning and at bedtime.  ? Dulaglutide (TRULICITY) 1.5 ZO/1.0RU SOPN Inject 1.5 mg into the skin once a week.  ? Flaxseed, Linseed, (FLAXSEED OIL) 1000 MG CAPS Take 1,000 mg by mouth in the morning and at bedtime.  ? glucose blood (TRUE METRIX BLOOD GLUCOSE TEST) test strip Test BS daily and as needed Dx E11.9  ? Krill Oil 500 MG CAPS Take 3 capsules (1,500 mg total) by mouth in the morning and at bedtime.  ? metoprolol (TOPROL-XL) 200 MG 24 hr tablet TAKE 1 TABLET ONE TIME DAILY, WITH OR IMMEDIATELY FOLLOWING A MEAL  ? Multiple Vitamin (MULTIVITAMIN) capsule Take 1 capsule by mouth daily.  ? traZODone (DESYREL)  150 MG tablet TAKE 1 OR 2 TABLETS AT BEDTIME FOR SLEEP  ? TRUEplus Lancets 33G MISC Test BS daily and as needed Dx E11.9  ? valsartan-hydrochlorothiazide (DIOVAN-HCT) 320-25 MG tablet Take 1 tablet by mouth daily.  ? zinc gluconate 50 MG tablet Take 50 mg by mouth daily.  ? pantoprazole (PROTONIX) 40 MG tablet TAKE 1 TABLET EVERY DAY FOR STOMACH (Patient not taking: Reported on 08/17/2021)  ? [DISCONTINUED] diphenhydramine-acetaminophen (TYLENOL PM) 25-500 MG TABS tablet Take 1 tablet by mouth at bedtime as needed. (Patient not taking: Reported on 08/17/2021)   ? ?No facility-administered encounter medications on file as of 08/17/2021.  ? ? ?Allergies (verified) ?Penicillins  ? ?History: ?Past Medical History:  ?Diagnosis Date  ? Diabetes mellitus without complication ([place])   ? Hypertension   ? Stroke [facility])   ? Vaginal Pap smear, abnormal   ? ?Past Surgical History:  ?Procedure Laterality Date  ? CESAREAN SECTION    ? CHOLECYSTECTOMY    ? COLONOSCOPY WITH PROPOFOL N/A 07/19/2020  ? Procedure: COLONOSCOPY WITH PROPOFOL;  Surgeon: Harvel Quale, MD;  [facility];  Service: Gastroenterology;  Laterality: N/A;  AM  ? POLYPECTOMY  07/19/2020  ? Procedure: POLYPECTOMY;  Surgeon: Harvel Quale, MD;  Location[facility];  Service: Gastroenterology;;  ? ?Family History  ?Problem Relation Age of Onset  ? Diabetes Mother   ? Diabetes Brother   ? Breast cancer Neg Hx   ? ?Social History  ? ?Socioeconomic History  ? Marital status: Married  ?  Spouse name: Jeneen Rinks  ? Number of children: 2  ? Years of education: 10  ? Highest education level: 10th grade  ?Occupational History  ? Occupation: disabled  ?Tobacco Use  ? Smoking status: Former  ?  Packs/day: 0.50  ?  Types: Cigarettes  ?  Start date: 12/08/1999  ?  Quit date: 03/12/2015  ?  Years since quitting: 6.4  ? Smokeless tobacco: Never  ?Vaping Use  ? Vaping Use: Never used  ?Substance and Sexual Activity  ? Alcohol use: Yes  ?  Alcohol/week: 1.0 standard drink  ?  Types: 1 Glasses of wine per week  ? Drug use: No  ? Sexual activity: Not Currently  ?  Birth control/protection: Post-menopausal  ?Other Topics Concern  ? Not on file  ?Social History Narrative  ? 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   ? She really enjoys getting out of of the house and working there.   ? ?Social Determinants of Health  ? ?Financial Resource Strain: Medium Risk  ? Difficulty of Paying Living Expenses: Somewhat hard  ?Food Insecurity: Food Insecurity Present  ? Worried About Charity fundraiser in  the Last Year: Sometimes true  ? Ran Out of Food in the Last Year: Sometimes true  ?Transportation Needs: No Transportation Needs  ? Lack of Transportation (Medical): No  ? Lack of Transportation (Non-Medical): No  ?Physical Activity: Insufficiently Active  ? Days of Exercise per Week: 4 days  ? Minutes of Exercise per Session: 30 min  ?Stress: No Stress Concern Present  ? Feeling of Stress : Only a little  ?Social Connections: Moderately Isolated  ? Frequency of Communication with Friends and Family: More than three times a week  ? Frequency of Social Gatherings with Friends and Family: More than three times a week  ? Attends Religious Services: Never  ? Active Member of Clubs or Organizations: No  ? Attends Club  or Organization Meetings: Never  ? Marital Status: Married  ? ? ?Tobacco Counseling ?Counseling given: Not Answered ? ? ?Clinical Intake: ? ?Pre-visit preparation completed: Yes ? ?Pain : 0-10 ?Pain Score: 5  ?Pain Type: Acute pain ?Pain Location: Mouth ?Pain Orientation: Right ?Pain Descriptors / Indicators: Sore ?Pain Onset: Yesterday (teeth extraction yesterday) ?Pain Frequency: Intermittent ? ?  ? ?BMI - recorded: 30.73 ?Nutritional Status: BMI > 30  Obese ?Nutritional Risks: None ?Diabetes: Yes ?CBG done?: No ?Did pt. bring in CBG monitor from home?: No ? ?How often do you need to have someone help you when you read instructions, pamphlets, or other written materials from your doctor or pharmacy?: 1 - Never ? ?Diabetic? Nutrition Risk Assessment: ? ?Has the patient had any N/V/D within the last 2 months?  No  ?Does the patient have any non-healing wounds?  No  ?Has the patient had any unintentional weight loss or weight gain?  No  ? ?Diabetes: ? ?Is the patient diabetic?  Yes  ?If diabetic, was a CBG obtained today?  No  ?Did the patient bring in their glucometer from home?  No  ?How often do you monitor your CBG's? 3x per week.  ? ?Financial Strains and Diabetes Management: ? ?Are you having any  financial strains with the device, your supplies or your medication? No .  ?Does the patient want to be seen by Chronic Care Management for management of their diabetes?  No  ?Would the patient like to be refer

## 2021-08-17 NOTE — Telephone Encounter (Signed)
? ?  Telephone encounter was:  Successful.  ?08/17/2021 ?Name: MEYGAN KYSER MRN: [id number] DOB: 11-May-1960 ? ?SHIRELY TOREN is a 61 y.o. year old female who is a primary care patient of Stacks, Cletus Gash, MD . The community resource team was consulted for assistance with Food Insecurity and Financial Difficulties related to financial strain ? ?Care guide performed the following interventions: Patient provided with information about care guide support team and interviewed to confirm resource needs.Patient stated she is struggling to get food and pay power bill due to financial strain. Patient requested I email her resources to her ? ?Follow Up Plan:  Care guide will follow up with patient by phone over the next day ? ? ? ?Larena Sox ?Care Guide, Embedded Care Coordination ?[place], Care Management  ?[phone] ?[address], [place], [place] [address] ?Phone: [phone] ?Email: [email or web address] ? ?  ?

## 2021-08-17 NOTE — Patient Instructions (Signed)
Marilyn Ford , ?Thank you for taking time to come for your Medicare Wellness Visit. I appreciate your ongoing commitment to your health goals. Please review the following plan we discussed and let me know if I can assist you in the future.  ? ?Screening recommendations/referrals: ?Colonoscopy: Done 07/19/2020 - Repeat in 5 years ?Mammogram: Done 03/01/2021 - Repeat annually  ?Bone Density: Done 02/20/2021 - Repeat in 5 years  ?Recommended yearly ophthalmology/optometry visit for glaucoma screening and checkup ?Recommended yearly dental visit for hygiene and checkup ? ?Vaccinations: ?Influenza vaccine: Done 2022 - repeat every fall ?Pneumococcal vaccine: Due - consider once per lifetime [id number] ?Tdap vaccine: Due - recommended every 10 years ?Shingles vaccine: Due. Shingrix is 2 doses 2-6 months apart and over 90% effective     ?Covid-19: Done 08/26/2019, 08/28/2019, & 05/04/2020 ? ?Advanced directives: Advance directive discussed with you today. Even though you declined this today, please call our office should you change your mind, and we can give you the proper paperwork for you to fill out.  ? ?Conditions/risks identified: Aim for 30 minutes of exercise or brisk walking, 6-8 glasses of water, and 5 servings of fruits and vegetables each day.  ? ?Next appointment: Follow up in one year for your annual wellness visit  ? ? ?Preventive Care 61 Years and Older, Female ?Preventive care refers to lifestyle choices and visits with your health care provider that can promote health and wellness. ?What does preventive care include? ?A yearly physical exam. This is also called an annual well check. ?Dental exams once or twice a year. ?Routine eye exams. Ask your health care provider how often you should have your eyes checked. ?Personal lifestyle choices, including: ?Daily care of your teeth and gums. ?Regular physical activity. ?Eating a healthy diet. ?Avoiding tobacco and drug use. ?Limiting alcohol use. ?Practicing safe  sex. ?Taking low-dose aspirin every day. ?Taking vitamin and mineral supplements as recommended by your health care provider. ?What happens during an annual well check? ?The services and screenings done by your health care provider during your annual well check will depend on your age, overall health, lifestyle risk factors, and family history of disease. ?Counseling  ?Your health care provider may ask you questions about your: ?Alcohol use. ?Tobacco use. ?Drug use. ?Emotional well-being. ?Home and relationship well-being. ?Sexual activity. ?Eating habits. ?History of falls. ?Memory and ability to understand (cognition). ?Work and work Statistician. ?Reproductive health. ?Screening  ?You may have the following tests or measurements: ?Height, weight, and BMI. ?Blood pressure. ?Lipid and cholesterol levels. These may be checked every 5 years, or more frequently if you are over 44 years old. ?Skin check. ?Lung cancer screening. You may have this screening every year starting at age 61 if you have a 30-pack-year history of smoking and currently smoke or have quit within the past 15 years. ?Fecal occult blood test (FOBT) of the stool. You may have this test every year starting at age 30. ?Flexible sigmoidoscopy or colonoscopy. You may have a sigmoidoscopy every 5 years or a colonoscopy every 10 years starting at age 60. ?Hepatitis C blood test. ?Hepatitis B blood test. ?Sexually transmitted disease (STD) testing. ?Diabetes screening. This is done by checking your blood sugar (glucose) after you have not eaten for a while (fasting). You may have this done every 1-3 years. ?Bone density scan. This is done to screen for osteoporosis. You may have this done starting at age 40. ?Mammogram. This may be done every 1-2 years. Talk to your health care provider  about how often you should have regular mammograms. ?Talk with your health care provider about your test results, treatment options, and if necessary, the need for more  tests. ?Vaccines  ?Your health care provider may recommend certain vaccines, such as: ?Influenza vaccine. This is recommended every year. ?Tetanus, diphtheria, and acellular pertussis (Tdap, Td) vaccine. You may need a Td booster every 10 years. ?Zoster vaccine. You may need this after age 44. ?Pneumococcal 13-valent conjugate (PCV13) vaccine. One dose is recommended after age 61. ?Pneumococcal polysaccharide (PPSV23) vaccine. One dose is recommended after age 58. ?Talk to your health care provider about which screenings and vaccines you need and how often you need them. ?This information is not intended to replace advice given to you by your health care provider. Make sure you discuss any questions you have with your health care provider. ?Document Released: 05/20/2015 Document Revised: 01/11/2016 Document Reviewed: 02/22/2015 ?Elsevier Interactive Patient Education ? 2017 [place]. ? ?Fall Prevention in the Home ?Falls can cause injuries. They can happen to people of all ages. There are many things you can do to make your home safe and to help prevent falls. ?What can I do on the outside of my home? ?Regularly fix the edges of walkways and driveways and fix any cracks. ?Remove anything that might make you trip as you walk through a door, such as a raised step or threshold. ?Trim any bushes or trees on the path to your home. ?Use bright outdoor lighting. ?Clear any walking paths of anything that might make someone trip, such as rocks or tools. ?Regularly check to see if handrails are loose or broken. Make sure that both sides of any steps have handrails. ?Any raised decks and porches should have guardrails on the edges. ?Have any leaves, snow, or ice cleared regularly. ?Use sand or salt on walking paths during winter. ?Clean up any spills in your garage right away. This includes oil or grease spills. ?What can I do in the bathroom? ?Use night lights. ?Install grab bars by the toilet and in the tub and shower.  Do not use towel bars as grab bars. ?Use non-skid mats or decals in the tub or shower. ?If you need to sit down in the shower, use a plastic, non-slip stool. ?Keep the floor dry. Clean up any water that spills on the floor as soon as it happens. ?Remove soap buildup in the tub or shower regularly. ?Attach bath mats securely with double-sided non-slip rug tape. ?Do not have throw rugs and other things on the floor that can make you trip. ?What can I do in the bedroom? ?Use night lights. ?Make sure that you have a light by your bed that is easy to reach. ?Do not use any sheets or blankets that are too big for your bed. They should not hang down onto the floor. ?Have a firm chair that has side arms. You can use this for support while you get dressed. ?Do not have throw rugs and other things on the floor that can make you trip. ?What can I do in the kitchen? ?Clean up any spills right away. ?Avoid walking on wet floors. ?Keep items that you use a lot in easy-to-reach places. ?If you need to reach something above you, use a strong step stool that has a grab bar. ?Keep electrical cords out of the way. ?Do not use floor polish or wax that makes floors slippery. If you must use wax, use non-skid floor wax. ?Do not have throw rugs and  other things on the floor that can make you trip. ?What can I do with my stairs? ?Do not leave any items on the stairs. ?Make sure that there are handrails on both sides of the stairs and use them. Fix handrails that are broken or loose. Make sure that handrails are as long as the stairways. ?Check any carpeting to make sure that it is firmly attached to the stairs. Fix any carpet that is loose or worn. ?Avoid having throw rugs at the top or bottom of the stairs. If you do have throw rugs, attach them to the floor with carpet tape. ?Make sure that you have a light switch at the top of the stairs and the bottom of the stairs. If you do not have them, ask someone to add them for you. ?What else  can I do to help prevent falls? ?Wear shoes that: ?Do not have high heels. ?Have rubber bottoms. ?Are comfortable and fit you well. ?Are closed at the toe. Do not wear sandals. ?If you use a stepladder: ?Dillard Essex

## 2021-08-18 ENCOUNTER — Telehealth: Payer: Self-pay

## 2021-08-18 NOTE — Telephone Encounter (Signed)
? ?  Telephone encounter was:  Unsuccessful.  08/18/2021 ?Name: RIANNON MUKHERJEE MRN: [id number] DOB: [birth year] ? ?Unsuccessful outbound call made today to assist with:  Food Insecurity and Financial Difficulties related to financial strain ? ? ? ?A HIPAA compliant voice message was left requesting a return call.  Instructed patient to call back at earliest convenience. (follow up call). ? ? ? ?Larena Sox ?Care Guide, Embedded Care Coordination ?, Care Management  ?[phone] ?[address], [place], [place] [address] ?Phone: [phone] ?Email: [email or web address] ? ?  ?

## 2021-08-21 ENCOUNTER — Telehealth: Payer: Self-pay | Admitting: *Deleted

## 2021-08-21 NOTE — Telephone Encounter (Signed)
Received call from patient. ? ?Reports that she is needed to take her ailing mother to [place] and will not return x1 week.  ? ?Requested to cancel all appointments in regards to surgery. Appointment for tag placement cancelled. Appointment for pre-op and surgery cancelled.  ?

## 2021-08-22 ENCOUNTER — Ambulatory Visit (INDEPENDENT_AMBULATORY_CARE_PROVIDER_SITE_OTHER): Payer: No Typology Code available for payment source | Admitting: Family Medicine

## 2021-08-22 ENCOUNTER — Ambulatory Visit: Payer: No Typology Code available for payment source | Admitting: Family Medicine

## 2021-08-22 ENCOUNTER — Encounter: Payer: Self-pay | Admitting: Family Medicine

## 2021-08-22 ENCOUNTER — Encounter (HOSPITAL_COMMUNITY): Payer: Self-pay

## 2021-08-22 ENCOUNTER — Other Ambulatory Visit (HOSPITAL_COMMUNITY): Payer: Self-pay

## 2021-08-22 VITALS — BP 98/62 | HR 80 | Temp 97.7°F | Ht 62.0 in | Wt 166.4 lb

## 2021-08-22 DIAGNOSIS — I10 Essential (primary) hypertension: Secondary | ICD-10-CM | POA: Diagnosis not present

## 2021-08-22 DIAGNOSIS — E118 Type 2 diabetes mellitus with unspecified complications: Secondary | ICD-10-CM

## 2021-08-22 DIAGNOSIS — K219 Gastro-esophageal reflux disease without esophagitis: Secondary | ICD-10-CM

## 2021-08-22 DIAGNOSIS — I69351 Hemiplegia and hemiparesis following cerebral infarction affecting right dominant side: Secondary | ICD-10-CM | POA: Diagnosis not present

## 2021-08-22 DIAGNOSIS — G4701 Insomnia due to medical condition: Secondary | ICD-10-CM

## 2021-08-22 DIAGNOSIS — E782 Mixed hyperlipidemia: Secondary | ICD-10-CM

## 2021-08-22 LAB — BAYER DCA HB A1C WAIVED: HB A1C (BAYER DCA - WAIVED): 6.7 % — ABNORMAL HIGH (ref 4.8–5.6)

## 2021-08-22 MED ORDER — VALSARTAN 320 MG PO TABS: 320.0000 mg | ORAL_TABLET | Freq: Every day | ORAL | 1 refills | Status: DC

## 2021-08-22 MED ORDER — TRAZODONE HCL 150 MG PO TABS: ORAL_TABLET | ORAL | 3 refills | Status: DC

## 2021-08-22 MED ORDER — METOPROLOL SUCCINATE ER 200 MG PO TB24: ORAL_TABLET | ORAL | 3 refills | Status: DC

## 2021-08-22 MED ORDER — AMLODIPINE BESYLATE 10 MG PO TABS: ORAL_TABLET | ORAL | 3 refills | Status: DC

## 2021-08-22 MED ORDER — ATORVASTATIN CALCIUM 80 MG PO TABS: ORAL_TABLET | ORAL | 3 refills | Status: DC

## 2021-08-22 MED ORDER — CELECOXIB 200 MG PO CAPS: ORAL_CAPSULE | ORAL | 3 refills | Status: DC

## 2021-08-22 MED ORDER — PANTOPRAZOLE SODIUM 40 MG PO TBEC: DELAYED_RELEASE_TABLET | ORAL | 3 refills | Status: DC

## 2021-08-22 MED ORDER — CLOPIDOGREL BISULFATE 75 MG PO TABS: 75.0000 mg | ORAL_TABLET | Freq: Every day | ORAL | 3 refills | Status: DC

## 2021-08-22 NOTE — Progress Notes (Signed)
? ?Subjective:  ?Patient ID: Marilyn Ford,  ?female    DOB: 27-Dec-1960  Age: 61 y.o.  ? ? ?CC: Medical Management of Chronic Issues ? ? ?HPI ?[name] presents for  follow-up of hypertension. Patient has no history of headache chest pain or shortness of breath or recent cough. Patient also denies symptoms of TIA such as numbness weakness lateralizing. Patient denies side effects from medication. States taking it regularly. ? ?Patient also  in for follow-up of elevated cholesterol. Doing well without complaints on current medication. Denies side effects  including myalgia and arthralgia and nausea. Also in today for liver function testing. Currently no chest pain, shortness of breath or other cardiovascular related symptoms noted. ? ?Follow-up of diabetes. Patient does check blood sugar at home. Patient denies symptoms such as excessive hunger or urinary frequency, excessive hunger, nausea ?No significant hypoglycemic spells noted. ?Medications reviewed. Pt reports taking them regularly. Pt. denies complication/adverse reaction today.  ? ? ?History ?Kennetha has a past medical history of Diabetes mellitus without complication ([place]), Hypertension, Stroke ([place]), and Vaginal Pap smear, abnormal.  ? ?She has a past surgical history that includes Cholecystectomy; Cesarean section; Colonoscopy with propofol (N/A, 07/19/2020); and polypectomy (07/19/2020).  ? ?Her family history includes Diabetes in her brother and mother.She reports that she quit smoking about 6 years ago. Her smoking use included cigarettes. She started smoking about 21 years ago. She smoked an average of .5 packs per day. She has never used smokeless tobacco. She reports current alcohol use of about 1.0 standard drink per week. She reports that she does not use drugs. ? ?Current Outpatient Medications on File Prior to Visit  ?Medication Sig Dispense Refill  ? Alcohol Swabs (B-D SINGLE USE SWABS REGULAR) PADS Test BS daily and as needed Dx E11.9  100 each 3  ? Apple Cider Vinegar 500 MG TABS Take 500 mg by mouth daily.    ? aspirin 81 MG EC tablet Take 1 tablet (81 mg total) by mouth daily. 30 tablet 0  ? Blood Glucose Calibration (TRUE METRIX LEVEL 1) Low SOLN Use with glucometer Dx E11.9 3 each 0  ? Blood Glucose Monitoring Suppl (TRUE METRIX AIR GLUCOSE METER) w/Device KIT Test BS daily and as needed Dx E11.9 1 kit 0  ? Calcium Carb-Cholecalciferol (CALCIUM-VITAMIN D) 600-400 MG-UNIT TABS Take 1 tablet by mouth daily.    ? Coenzyme Q10 (COQ10) 100 MG CAPS Take 100 mg by mouth daily.    ? Cranberry 425 MG CAPS Take 425 mg by mouth in the morning and at bedtime.    ? Dulaglutide (TRULICITY) 1.5 XI/3.3AS SOPN Inject 1.5 mg into the skin once a week. 6 mL 4  ? Flaxseed, Linseed, (FLAXSEED OIL) 1000 MG CAPS Take 1,000 mg by mouth in the morning and at bedtime.    ? glucose blood (TRUE METRIX BLOOD GLUCOSE TEST) test strip Test BS daily and as needed Dx E11.9 100 each 3  ? Krill Oil 500 MG CAPS Take 3 capsules (1,500 mg total) by mouth in the morning and at bedtime.    ? Multiple Vitamin (MULTIVITAMIN) capsule Take 1 capsule by mouth daily.    ? TRUEplus Lancets 33G MISC Test BS daily and as needed Dx E11.9 100 each 3  ? zinc gluconate 50 MG tablet Take 50 mg by mouth daily.    ? ?No current facility-administered medications on file prior to visit.  ? ? ?ROS ?Review of Systems  ?Constitutional: Negative.   ?HENT: Negative.    ?  Eyes:  Negative for visual disturbance.  ?Respiratory:  Negative for shortness of breath.   ?Cardiovascular:  Negative for chest pain.  ?Gastrointestinal:  Negative for abdominal pain.  ?Musculoskeletal:  Negative for arthralgias.  ? ?Objective:  ?BP 98/62   Pulse 80   Temp 97.7 ?F (36.5 ?C)   Ht '5\' 2"'  (1.575 m)   Wt 166 lb 6.4 oz (75.5 kg)   SpO2 97%   BMI 30.43 kg/m?  ? ?BP Readings from Last 3 Encounters:  ?08/22/21 98/62  ?08/10/21 139/84  ?05/25/21 115/78  ? ? ?Wt Readings from Last 3 Encounters:  ?08/22/21 166 lb 6.4 oz (75.5  kg)  ?08/17/21 168 lb (76.2 kg)  ?08/10/21 168 lb (76.2 kg)  ? ? ? ?Physical Exam ?Constitutional:   ?   General: She is not in acute distress. ?   Appearance: She is well-developed.  ?Cardiovascular:  ?   Rate and Rhythm: Normal rate and regular rhythm.  ?Pulmonary:  ?   Breath sounds: Normal breath sounds.  ?Musculoskeletal:     ?   General: Normal range of motion.  ?Skin: ?   General: Skin is warm and dry.  ?Neurological:  ?   Mental Status: She is alert and oriented to person, place, and time.  ? ? ?Diabetic Foot Exam - Simple   ?No data filed ?  ? ? ? ? ?Assessment & Plan:  ? ?Dortha was seen today for medical management of chronic issues. ? ?Diagnoses and all orders for this visit: ? ?Controlled type 2 diabetes mellitus with complication, without long-term current use of insulin ([place]) ?-     Bayer DCA Hb A1c Waived ?-     atorvastatin (LIPITOR) 80 MG tablet; TAKE 1 TABLET EVERY DAY AT 6PM ? ?Primary hypertension ?-     CBC with Differential/Platelet ?-     CMP14+EGFR ? ?Mixed hyperlipidemia ?-     Lipid panel ?-     atorvastatin (LIPITOR) 80 MG tablet; TAKE 1 TABLET EVERY DAY AT 6PM ? ?Essential hypertension ?-     amLODipine (NORVASC) 10 MG tablet; 1 tablet daily ?-     metoprolol (TOPROL-XL) 200 MG 24 hr tablet; TAKE 1 TABLET ONE TIME DAILY, WITH OR IMMEDIATELY FOLLOWING A MEAL ? ?Hemiparesis affecting right side as late effect of cerebrovascular accident (CVA) ([place]) ?-     celecoxib (CELEBREX) 200 MG capsule; TAKE 1 CAPSULE EVERY DAY WITH FOOD ?-     clopidogrel (PLAVIX) 75 MG tablet; Take 1 tablet (75 mg total) by mouth daily. ? ?Gastroesophageal reflux disease without esophagitis ?-     pantoprazole (PROTONIX) 40 MG tablet; TAKE 1 TABLET EVERY DAY FOR STOMACH ? ?Insomnia due to medical condition ?-     traZODone (DESYREL) 150 MG tablet; TAKE 1 OR 2 TABLETS AT BEDTIME FOR SLEEP ? ?Other orders ?-     valsartan (DIOVAN) 320 MG tablet; Take 1 tablet (320 mg total) by mouth daily. For blood pressure. ? ? ?I  have discontinued Luretta Everly. Billey's valsartan-hydrochlorothiazide. I am also having her start on valsartan. Additionally, I am having her maintain her multivitamin, aspirin, Flaxseed Oil, Apple Cider Vinegar, Calcium-Vitamin D, zinc gluconate, CoQ10, Cranberry, Krill Oil, True Metrix Air Glucose Meter, True Metrix Blood Glucose Test, TRUEplus Lancets 33G, True Metrix Level 1, B-D SINGLE USE SWABS REGULAR, Trulicity, amLODipine, atorvastatin, celecoxib, clopidogrel, metoprolol, pantoprazole, and traZODone. ? ?Meds ordered this encounter  ?Medications  ? amLODipine (NORVASC) 10 MG tablet  ?  Sig: 1 tablet daily  ?  Dispense:  90 tablet  ?  Refill:  3  ? atorvastatin (LIPITOR) 80 MG tablet  ?  Sig: TAKE 1 TABLET EVERY DAY AT 6PM  ?  Dispense:  90 tablet  ?  Refill:  3  ? celecoxib (CELEBREX) 200 MG capsule  ?  Sig: TAKE 1 CAPSULE EVERY DAY WITH FOOD  ?  Dispense:  90 capsule  ?  Refill:  3  ? clopidogrel (PLAVIX) 75 MG tablet  ?  Sig: Take 1 tablet (75 mg total) by mouth daily.  ?  Dispense:  90 tablet  ?  Refill:  3  ? metoprolol (TOPROL-XL) 200 MG 24 hr tablet  ?  Sig: TAKE 1 TABLET ONE TIME DAILY, WITH OR IMMEDIATELY FOLLOWING A MEAL  ?  Dispense:  90 tablet  ?  Refill:  3  ? pantoprazole (PROTONIX) 40 MG tablet  ?  Sig: TAKE 1 TABLET EVERY DAY FOR STOMACH  ?  Dispense:  90 tablet  ?  Refill:  3  ? traZODone (DESYREL) 150 MG tablet  ?  Sig: TAKE 1 OR 2 TABLETS AT BEDTIME FOR SLEEP  ?  Dispense:  180 tablet  ?  Refill:  3  ? valsartan (DIOVAN) 320 MG tablet  ?  Sig: Take 1 tablet (320 mg total) by mouth daily. For blood pressure.  ?  Dispense:  90 tablet  ?  Refill:  1  ? ?Reminded her to schedule eye exam. ? ?Follow-up: Return in about 3 months (around 11/21/2021). ? ?Claretta Fraise, M.D. ?

## 2021-08-22 NOTE — Progress Notes (Signed)
I have reviewed and agree with the above AWV documentation.  ? ?Monia Pouch, FNP-C ?[facility] ?[address] ?[place], [place] [address] ?[phone] ?08/22/21 ? ?

## 2021-08-23 ENCOUNTER — Encounter (HOSPITAL_COMMUNITY): Payer: Self-pay

## 2021-08-23 LAB — CMP14+EGFR
ALT: 24 IU/L (ref 0–32)
AST: 19 IU/L (ref 0–40)
Albumin/Globulin Ratio: 1.7 (ref 1.2–2.2)
Albumin: 4.3 g/dL (ref 3.8–4.9)
Alkaline Phosphatase: 125 IU/L — ABNORMAL HIGH (ref 44–121)
BUN/Creatinine Ratio: 15 (ref 12–28)
BUN: 15 mg/dL (ref 8–27)
Bilirubin Total: 0.5 mg/dL (ref 0.0–1.2)
CO2: 25 mmol/L (ref 20–29)
Calcium: 9.9 mg/dL (ref 8.7–10.3)
Chloride: 100 mmol/L (ref 96–106)
Creatinine, Ser: 1.03 mg/dL — ABNORMAL HIGH (ref 0.57–1.00)
Globulin, Total: 2.5 g/dL (ref 1.5–4.5)
Glucose: 157 mg/dL — ABNORMAL HIGH (ref 70–99)
Potassium: 4.4 mmol/L (ref 3.5–5.2)
Sodium: 143 mmol/L (ref 134–144)
Total Protein: 6.8 g/dL (ref 6.0–8.5)
eGFR: 62 mL/min/{1.73_m2} (ref >59)

## 2021-08-23 LAB — CBC WITH DIFFERENTIAL/PLATELET
Basophils Absolute: 0.1 10*3/uL (ref 0.0–0.2)
Basos: 1 %
EOS (ABSOLUTE): 0.2 10*3/uL (ref 0.0–0.4)
Eos: 2 %
Hematocrit: 41.2 % (ref 34.0–46.6)
Hemoglobin: 13.7 g/dL (ref 11.1–15.9)
Immature Grans (Abs): 0 10*3/uL (ref 0.0–0.1)
Immature Granulocytes: 0 %
Lymphocytes Absolute: 2.2 10*3/uL (ref 0.7–3.1)
Lymphs: 21 %
MCH: 30.4 pg (ref 26.6–33.0)
MCHC: 33.3 g/dL (ref 31.5–35.7)
MCV: 92 fL (ref 79–97)
Monocytes Absolute: 0.7 10*3/uL (ref 0.1–0.9)
Monocytes: 6 %
Neutrophils Absolute: 7.2 10*3/uL — ABNORMAL HIGH (ref 1.4–7.0)
Neutrophils: 70 %
Platelets: 231 10*3/uL (ref 150–450)
RBC: 4.5 x10E6/uL (ref 3.77–5.28)
RDW: 12.7 % (ref 11.7–15.4)
WBC: 10.4 10*3/uL (ref 3.4–10.8)

## 2021-08-23 LAB — LIPID PANEL
Chol/HDL Ratio: 3.5 ratio (ref 0.0–4.4)
Cholesterol, Total: 133 mg/dL (ref 100–199)
HDL: 38 mg/dL — ABNORMAL LOW (ref >39)
LDL Chol Calc (NIH): 60 mg/dL (ref 0–99)
Triglycerides: 212 mg/dL — ABNORMAL HIGH (ref 0–149)
VLDL Cholesterol Cal: 35 mg/dL (ref 5–40)

## 2021-08-23 NOTE — Progress Notes (Signed)
Hello Arshia,  Your lab result is normal and/or stable.Some minor variations that are not significant are commonly marked abnormal, but do not represent any medical problem for you.  Best regards, Gesselle Fitzsimons, M.D.

## 2021-08-25 ENCOUNTER — Encounter (HOSPITAL_COMMUNITY): Admission: RE | Payer: Self-pay | Source: Home / Self Care

## 2021-08-25 ENCOUNTER — Ambulatory Visit (HOSPITAL_COMMUNITY)
Admission: RE | Admit: 2021-08-25 | Payer: No Typology Code available for payment source | Source: Home / Self Care | Admitting: General Surgery

## 2021-08-25 SURGERY — EXCISION OF BREAST BIOPSY
Anesthesia: General | Laterality: Right

## 2021-08-30 ENCOUNTER — Ambulatory Visit: Payer: No Typology Code available for payment source | Admitting: Family Medicine

## 2021-09-01 ENCOUNTER — Telehealth: Payer: Self-pay | Admitting: Family Medicine

## 2021-09-01 ENCOUNTER — Encounter: Payer: Self-pay | Admitting: Nurse Practitioner

## 2021-09-01 ENCOUNTER — Ambulatory Visit (INDEPENDENT_AMBULATORY_CARE_PROVIDER_SITE_OTHER): Payer: No Typology Code available for payment source | Admitting: Nurse Practitioner

## 2021-09-01 VITALS — BP 136/73 | HR 79 | Ht 62.0 in | Wt 164.0 lb

## 2021-09-01 DIAGNOSIS — L03011 Cellulitis of right finger: Secondary | ICD-10-CM | POA: Diagnosis not present

## 2021-09-01 MED ORDER — DOXYCYCLINE HYCLATE 100 MG PO TABS: 100.0000 mg | ORAL_TABLET | Freq: Two times a day (BID) | ORAL | 0 refills | Status: DC

## 2021-09-01 NOTE — Progress Notes (Signed)
? ?  Subjective:  ? ? Patient ID: SHEYNA PETTIBONE, female    DOB: January 07, 1961, 61 y.o.   MRN: [id number] ? ? ?Chief Complaint: finger swollen (Right first finger. Swollen, red, getting worse) ? ? ?HPI ?Patient woke up a weeks ago with right index finger swollen and red. Has gotten worse over the last few days. It is now throbbing with pain. Deneis history of gout. Denies injury . Does not remember bug bite. ? ? ? ?Review of Systems  ?Constitutional:  Negative for diaphoresis.  ?Eyes:  Negative for pain.  ?Respiratory:  Negative for shortness of breath.   ?Cardiovascular:  Negative for chest pain, palpitations and leg swelling.  ?Gastrointestinal:  Negative for abdominal pain.  ?Endocrine: Negative for polydipsia.  ?Skin:  Negative for rash.  ?Neurological:  Negative for dizziness, weakness and headaches.  ?Hematological:  Does not bruise/bleed easily.  ?All other systems reviewed and are negative. ? ?   ?Objective:  ? Physical Exam ?Vitals reviewed.  ?Constitutional:   ?   Appearance: Normal appearance.  ?Cardiovascular:  ?   Rate and Rhythm: Normal rate and regular rhythm.  ?   Heart sounds: Normal heart sounds.  ?Pulmonary:  ?   Effort: Pulmonary effort is normal.  ?   Breath sounds: Normal breath sounds.  ?Skin: ?   General: Skin is warm.  ?Neurological:  ?   General: No focal deficit present.  ?   Mental Status: She is alert and oriented to person, place, and time.  ?Psychiatric:     ?   Mood and Affect: Mood normal.     ?   Behavior: Behavior normal.  ? ?BP 136/73   Pulse 79   Ht '5\' 2"'$  (1.575 m)   Wt 164 lb (74.4 kg)   SpO2 96%   BMI 30.00 kg/m?  ? ? ? ? ?   ?Assessment & Plan:  ? ?[name] in today with chief complaint of finger swollen (Right first finger. Swollen, red, getting worse) ? ? ?1. Cellulitis of finger of right hand ?Soak in epsom salt bid ?RTO if not improving ?Tylenol OTC for pain ?- doxycycline (VIBRA-TABS) 100 MG tablet; Take 1 tablet (100 mg total) by mouth 2 (two) times daily. 1 po  bid  Dispense: 20 tablet; Refill: 0 ? ? ? ?The above assessment and management plan was discussed with the patient. The patient verbalized understanding of and has agreed to the management plan. Patient is aware to call the clinic if symptoms persist or worsen. Patient is aware when to return to the clinic for a follow-up visit. Patient educated on when it is appropriate to go to the emergency department.  ? ?Mary-Margaret Hassell Done, FNP ? ? ?

## 2021-09-01 NOTE — Patient Instructions (Signed)
Cellulitis, Adult  Cellulitis is a skin infection. The infected area is often warm, red, swollen, and sore. It occurs most often in the arms and lower legs. It is very important to get treated for this condition. What are the causes? This condition is caused by bacteria. The bacteria enter through a break in the skin, such as a cut, burn, insect bite, open sore, or crack. What increases the risk? This condition is more likely to occur in people who: Have a weak body defense system (immune system). Have open cuts, burns, bites, or scrapes on the skin. Are older than 60 years of age. Have a blood sugar problem (diabetes). Have a long-lasting (chronic) liver disease (cirrhosis) or kidney disease. Are very overweight (obese). Have a skin problem, such as: Itchy rash (eczema). Slow movement of blood in the veins (venous stasis). Fluid buildup below the skin (edema). Have been treated with high-energy rays (radiation). Use IV drugs. What are the signs or symptoms? Symptoms of this condition include: Skin that is: Red. Streaking. Spotting. Swollen. Sore or painful when you touch it. Warm. A fever. Chills. Blisters. How is this diagnosed? This condition is diagnosed based on: Medical history. Physical exam. Blood tests. Imaging tests. How is this treated? Treatment for this condition may include: Medicines to treat infections or allergies. Home care, such as: Rest. Placing cold or warm cloths (compresses) on the skin. Hospital care, if the condition is very bad. Follow these instructions at home: Medicines Take over-the-counter and prescription medicines only as told by your doctor. If you were prescribed an antibiotic medicine, take it as told by your doctor. Do not stop taking it even if you start to feel better. General instructions  Drink enough fluid to keep your pee (urine) pale yellow. Do not touch or rub the infected area. Raise (elevate) the infected area above  the level of your heart while you are sitting or lying down. Place cold or warm cloths on the area as told by your doctor. Keep all follow-up visits as told by your doctor. This is important. Contact a doctor if: You have a fever. You do not start to get better after 1-2 days of treatment. Your bone or joint under the infected area starts to hurt after the skin has healed. Your infection comes back. This can happen in the same area or another area. You have a swollen bump in the area. You have new symptoms. You feel ill and have muscle aches and pains. Get help right away if: Your symptoms get worse. You feel very sleepy. You throw up (vomit) or have watery poop (diarrhea) for a long time. You see red streaks coming from the area. Your red area gets larger. Your red area turns dark in color. These symptoms may represent a serious problem that is an emergency. Do not wait to see if the symptoms will go away. Get medical help right away. Call your local emergency services (911 in the [place] Do not drive yourself to the hospital. Summary Cellulitis is a skin infection. The area is often warm, red, swollen, and sore. This condition is treated with medicines, rest, and cold and warm cloths. Take all medicines only as told by your doctor. Tell your doctor if symptoms do not start to get better after 1-2 days of treatment. This information is not intended to replace advice given to you by your health care provider. Make sure you discuss any questions you have with your health care provider. Document Revised: 02/02/2021 Document   Reviewed: 02/02/2021 Elsevier Patient Education  2023 [employer]  

## 2021-09-04 ENCOUNTER — Other Ambulatory Visit: Payer: Self-pay | Admitting: Family Medicine

## 2021-09-04 ENCOUNTER — Encounter: Payer: Self-pay | Admitting: Family Medicine

## 2021-09-04 ENCOUNTER — Encounter (HOSPITAL_COMMUNITY): Payer: Self-pay | Admitting: *Deleted

## 2021-09-04 ENCOUNTER — Ambulatory Visit (INDEPENDENT_AMBULATORY_CARE_PROVIDER_SITE_OTHER): Payer: No Typology Code available for payment source

## 2021-09-04 ENCOUNTER — Ambulatory Visit (INDEPENDENT_AMBULATORY_CARE_PROVIDER_SITE_OTHER): Payer: No Typology Code available for payment source | Admitting: Family Medicine

## 2021-09-04 ENCOUNTER — Other Ambulatory Visit: Payer: Self-pay

## 2021-09-04 ENCOUNTER — Emergency Department (HOSPITAL_COMMUNITY)
Admission: EM | Admit: 2021-09-04 | Discharge: 2021-09-04 | Disposition: A | Discharge status: Home / Self Care | Payer: No Typology Code available for payment source | Attending: Emergency Medicine | Admitting: Emergency Medicine

## 2021-09-04 VITALS — BP 110/60 | HR 83 | Temp 98.1°F | Ht 62.0 in | Wt 167.1 lb

## 2021-09-04 DIAGNOSIS — W5503XA Scratched by cat, initial encounter: Secondary | ICD-10-CM | POA: Diagnosis not present

## 2021-09-04 DIAGNOSIS — L089 Local infection of the skin and subcutaneous tissue, unspecified: Secondary | ICD-10-CM | POA: Diagnosis not present

## 2021-09-04 DIAGNOSIS — M79644 Pain in right finger(s): Secondary | ICD-10-CM | POA: Diagnosis not present

## 2021-09-04 DIAGNOSIS — Z79899 Other long term (current) drug therapy: Secondary | ICD-10-CM | POA: Insufficient documentation

## 2021-09-04 DIAGNOSIS — Z7982 Long term (current) use of aspirin: Secondary | ICD-10-CM | POA: Diagnosis not present

## 2021-09-04 DIAGNOSIS — Z794 Long term (current) use of insulin: Secondary | ICD-10-CM | POA: Insufficient documentation

## 2021-09-04 DIAGNOSIS — E119 Type 2 diabetes mellitus without complications: Secondary | ICD-10-CM | POA: Diagnosis not present

## 2021-09-04 DIAGNOSIS — L03011 Cellulitis of right finger: Secondary | ICD-10-CM | POA: Diagnosis not present

## 2021-09-04 DIAGNOSIS — I10 Essential (primary) hypertension: Secondary | ICD-10-CM | POA: Diagnosis not present

## 2021-09-04 DIAGNOSIS — S61250A Open bite of right index finger without damage to nail, initial encounter: Secondary | ICD-10-CM | POA: Diagnosis not present

## 2021-09-04 DIAGNOSIS — M7989 Other specified soft tissue disorders: Secondary | ICD-10-CM | POA: Diagnosis not present

## 2021-09-04 LAB — CBC WITH DIFFERENTIAL/PLATELET
Abs Immature Granulocytes: 0.03 10*3/uL (ref 0.00–0.07)
Basophils Absolute: 0.1 10*3/uL (ref 0.0–0.1)
Basophils Relative: 1 %
Eosinophils Absolute: 0.2 10*3/uL (ref 0.0–0.5)
Eosinophils Relative: 2 %
HCT: 43.8 % (ref 36.0–46.0)
Hemoglobin: 15.1 g/dL — ABNORMAL HIGH (ref 12.0–15.0)
Immature Granulocytes: 0 %
Lymphocytes Relative: 19 %
Lymphs Abs: 2.1 10*3/uL (ref 0.7–4.0)
MCH: 30.6 pg (ref 26.0–34.0)
MCHC: 34.5 g/dL (ref 30.0–36.0)
MCV: 88.7 fL (ref 80.0–100.0)
Monocytes Absolute: 0.5 10*3/uL (ref 0.1–1.0)
Monocytes Relative: 5 %
Neutro Abs: 8 10*3/uL — ABNORMAL HIGH (ref 1.7–7.7)
Neutrophils Relative %: 73 %
Platelets: 276 10*3/uL (ref 150–400)
RBC: 4.94 MIL/uL (ref 3.87–5.11)
RDW: 12.4 % (ref 11.5–15.5)
WBC: 10.9 10*3/uL — ABNORMAL HIGH (ref 4.0–10.5)
nRBC: 0 % (ref 0.0–0.2)

## 2021-09-04 LAB — BASIC METABOLIC PANEL
Anion gap: 12 (ref 5–15)
BUN: 17 mg/dL (ref 6–20)
CO2: 26 mmol/L (ref 22–32)
Calcium: 10.5 mg/dL — ABNORMAL HIGH (ref 8.9–10.3)
Chloride: 101 mmol/L (ref 98–111)
Creatinine, Ser: 0.91 mg/dL (ref 0.44–1.00)
GFR, Estimated: >60 mL/min (ref >60)
Glucose, Bld: 136 mg/dL — ABNORMAL HIGH (ref 70–99)
Potassium: 3.7 mmol/L (ref 3.5–5.1)
Sodium: 139 mmol/L (ref 135–145)

## 2021-09-04 MED ORDER — MELOXICAM 15 MG PO TABS: 15.0000 mg | ORAL_TABLET | Freq: Every day | ORAL | 5 refills | Status: DC

## 2021-09-04 MED ORDER — METRONIDAZOLE 500 MG PO TABS: 500.0000 mg | ORAL_TABLET | Freq: Two times a day (BID) | ORAL | 0 refills | Status: DC

## 2021-09-04 MED ORDER — HYDROCODONE-ACETAMINOPHEN 5-325 MG PO TABS: 1.0000 | ORAL_TABLET | Freq: Four times a day (QID) | ORAL | 0 refills | Status: DC | PRN

## 2021-09-04 MED ORDER — METRONIDAZOLE 500 MG PO TABS
500.0000 mg | ORAL_TABLET | Freq: Once | ORAL | Status: AC
  Administered 2021-09-04: 500 mg via ORAL
  Filled 2021-09-04: qty 1

## 2021-09-04 MED ORDER — LEVOFLOXACIN 500 MG PO TABS
500.0000 mg | ORAL_TABLET | Freq: Every day | ORAL | 0 refills | Status: DC
Start: 2021-09-04 — End: 2021-09-12

## 2021-09-04 MED ORDER — LEVOFLOXACIN 500 MG PO TABS
500.0000 mg | ORAL_TABLET | Freq: Once | ORAL | Status: AC
  Administered 2021-09-04: 500 mg via ORAL
  Filled 2021-09-04: qty 1

## 2021-09-04 NOTE — Telephone Encounter (Signed)
PATIENT AWARE

## 2021-09-04 NOTE — Discharge Instructions (Signed)
Take the antibiotics as prescribed, your next dose is tomorrow morning.  Elevation and warm compresses also can help with this infection.  You may take the hydrocodone if needed for pain relief, but use caution as this medication will make you drowsy.  Do not drive within 4 hours of taking the pain medicine.  Discontinue the doxycycline. ?

## 2021-09-04 NOTE — Progress Notes (Signed)
? ?  Acute Office Visit ? ?Subjective:  ? ?  ?Patient ID: Marilyn Ford, female    DOB: [birth year], 61 y.o.   MRN: [id number] ? ?Chief Complaint  ?Patient presents with  ? Cellulitis  ? ? ?HPI ?Patient is in today for cellulitis of her right index finger. She reports swelling, redness, and pain in first joint of his finger for 1 week. This started after she pulled her cat out of a trap. She knows that her cat bit and scratched at here but she isn't sure if it broke the skin. She had a visit for this and was started on doxycyline 3 days ago. She has been taking this as prescribed although it has been making her sick. Even though she has been on antibiotics, she reports that the pain, swelling, and redness has worsened. Since last night she has been unable to bend this joint. She denies fever.  ? ?ROS ?As per HPI.  ? ?   ?Objective:  ?  ?BP 110/60 Comment: at home reading per pt  Pulse 83   Temp 98.1 ?F (36.7 ?C) (Temporal)   Ht '5\' 2"'$  (1.575 m)   Wt 167 lb 2 oz (75.8 kg)   BMI 30.57 kg/m?  ? ? ?Physical Exam ?Vitals and nursing note reviewed.  ?Constitutional:   ?   General: She is not in acute distress. ?   Appearance: She is not ill-appearing, toxic-appearing or diaphoretic.  ?Musculoskeletal:  ?   Right hand: Swelling, tenderness and bony tenderness present. Decreased range of motion.  ?   Comments: Right index finger: erythema, swelling, decreased ROM to right distal phalax  ?Skin: ?   General: Skin is warm and dry.  ?Neurological:  ?   Mental Status: She is alert and oriented to person, place, and time.  ?Psychiatric:     ?   Mood and Affect: Mood normal.     ?   Behavior: Behavior normal.  ? ? ?No results found for any visits on 09/04/21. ? ? ?   ?Assessment & Plan:  ? ?Brecklynn was seen today for cellulitis. ? ?Diagnoses and all orders for this visit: ? ?Cellulitis of finger of right hand ?From ? Cat bite. Worsening despite treatment with oral abx. Now with increased pain, swelling, and decreased ROM. Xray  today in office could not exclude osteomyelitis. Instructed patient to go to emergency room for further evaluation. Patient agrees.  ?-     DG Finger Index Right; Future ? ? ?Gwenlyn Perking, FNP ? ? ?

## 2021-09-04 NOTE — ED Provider Notes (Signed)
?[facility] ?Provider Note ? ? ?CSN: [id number] ?Arrival date & time: 09/04/21  1307 ? ?  ? ?History ? ?Chief Complaint  ?Patient presents with  ? Hand Pain  ? ? ?Marilyn Ford is a 61 y.o. female with a history including type 2 diabetes, hypertension, GERD presenting for evaluation of worsening right index finger pain, redness and swelling.  She describes being either scratched or bit by her mother's cat 1 week ago at the tip of her right index finger, but did not feel at the time that the cat had actually punctured the skin.  Since this event she has had increasing pain redness and swelling.  She was seen by her PCP 3 days ago and was placed on doxycycline but states that she became very nauseated and vomited with each tablet therefore was only able to keep 1 dose down.  She was seen in recheck by her PCP today at which time x-ray was suggesting there may be some early osteomyelitis of the distal phalanx proximally.  Patient states she is current with her tetanus vaccine.  The cat belongs to the family. They are unsure of his tetanus status but is healthy. ? ? ?The history is provided by the patient and medical records.  ? ?  ? ?Home Medications ?Prior to Admission medications   ?Medication Sig Start Date End Date Taking? Authorizing Provider  ?HYDROcodone-acetaminophen (NORCO/VICODIN) 5-325 MG tablet Take 1 tablet by mouth every 6 (six) hours as needed. 09/04/21  Yes Sherena Machorro, Almyra Free, PA-C  ?levofloxacin (LEVAQUIN) 500 MG tablet Take 1 tablet (500 mg total) by mouth daily. 09/04/21  Yes Deetya Drouillard, Almyra Free, PA-C  ?metroNIDAZOLE (FLAGYL) 500 MG tablet Take 1 tablet (500 mg total) by mouth 2 (two) times daily. 09/04/21  Yes Evalee Jefferson, PA-C  ?Alcohol Swabs (B-D SINGLE USE SWABS REGULAR) PADS Test BS daily and as needed Dx E11.9 12/07/20   Claretta Fraise, MD  ?amLODipine (NORVASC) 10 MG tablet 1 tablet daily 08/22/21   Claretta Fraise, MD  ?Apple Cider Vinegar 500 MG TABS Take 500 mg by mouth daily.    [provider]  ?aspirin 81 MG EC tablet Take 1 tablet (81 mg total) by mouth daily. 12/08/18   Claretta Fraise, MD  ?atorvastatin (LIPITOR) 80 MG tablet TAKE 1 TABLET EVERY DAY AT 6PM 08/22/21   Claretta Fraise, MD  ?Blood Glucose Calibration (TRUE METRIX LEVEL 1) Low SOLN Use with glucometer Dx E11.9 12/07/20   Claretta Fraise, MD  ?Blood Glucose Monitoring Suppl (TRUE METRIX AIR GLUCOSE METER) w/Device KIT Test BS daily and as needed Dx E11.9 12/07/20   Claretta Fraise, MD  ?Calcium Carb-Cholecalciferol (CALCIUM-VITAMIN D) 600-400 MG-UNIT TABS Take 1 tablet by mouth daily.    [provider]  ?clopidogrel (PLAVIX) 75 MG tablet Take 1 tablet (75 mg total) by mouth daily. 08/22/21   Claretta Fraise, MD  ?Coenzyme Q10 (COQ10) 100 MG CAPS Take 100 mg by mouth daily.    [provider]  ?Cranberry 425 MG CAPS Take 425 mg by mouth in the morning and at bedtime.    [provider]  ?Dulaglutide (TRULICITY) 1.5 HW/2.9HB SOPN Inject 1.5 mg into the skin once a week. 07/21/21   Claretta Fraise, MD  ?Flaxseed, Linseed, (FLAXSEED OIL) 1000 MG CAPS Take 1,000 mg by mouth in the morning and at bedtime.    [provider]  ?glucose blood (TRUE METRIX BLOOD GLUCOSE TEST) test strip Test BS daily and as needed Dx E11.9 12/07/20  Claretta Fraise, MD  ?Astrid Drafts 500 MG CAPS Take 3 capsules (1,500 mg total) by mouth in the morning and at bedtime. 11/10/20   Claretta Fraise, MD  ?meloxicam (MOBIC) 15 MG tablet Take 1 tablet (15 mg total) by mouth daily. For joint and muscle pain 09/04/21   Claretta Fraise, MD  ?metoprolol (TOPROL-XL) 200 MG 24 hr tablet TAKE 1 TABLET ONE TIME DAILY, WITH OR IMMEDIATELY FOLLOWING A MEAL 08/22/21   Claretta Fraise, MD  ?Multiple Vitamin (MULTIVITAMIN) capsule Take 1 capsule by mouth daily.    [provider]  ?pantoprazole (PROTONIX) 40 MG tablet TAKE 1 TABLET EVERY DAY FOR STOMACH 08/22/21   Claretta Fraise, MD  ?traZODone (DESYREL) 150 MG tablet TAKE 1 OR 2 TABLETS AT BEDTIME FOR  SLEEP 08/22/21   Claretta Fraise, MD  ?TRUEplus Lancets 33G MISC Test BS daily and as needed Dx E11.9 12/07/20   Claretta Fraise, MD  ?valsartan (DIOVAN) 320 MG tablet Take 1 tablet (320 mg total) by mouth daily. For blood pressure. 08/22/21   Claretta Fraise, MD  ?zinc gluconate 50 MG tablet Take 50 mg by mouth daily.    [provider]  ?   ? ?Allergies    ?Penicillins and Sulfa antibiotics   ? ?Review of Systems   ?Review of Systems  ?Constitutional:  Negative for fever.  ?Musculoskeletal:  Positive for arthralgias and joint swelling. Negative for myalgias.  ?Skin:  Positive for color change.  ?Neurological:  Negative for weakness and numbness.  ?All other systems reviewed and are negative. ? ?Physical Exam ?Updated Vital Signs ?BP (!) 146/93 (BP Location: Left Arm)   Pulse 81   Temp 97.6 ?F (36.4 ?C) (Oral)   Resp 18   Ht _0  (1.575 m)   Wt 75 kg   SpO2 95%   BMI 30.24 kg/m?  ?Physical Exam ?Constitutional:   ?   Appearance: She is well-developed.  ?HENT:  ?   Head: Atraumatic.  ?Cardiovascular:  ?   Comments: Pulses equal bilaterally ?Musculoskeletal:     ?   General: Swelling and tenderness present.  ?   Cervical back: Normal range of motion.  ?Skin: ?   General: Skin is warm and dry.  ?Neurological:  ?   Mental Status: She is alert.  ?   Sensory: No sensory deficit.  ?   Motor: No weakness.  ?   Deep Tendon Reflexes: Reflexes normal.  ? ? ?ED Results / Procedures / Treatments   ?Labs ?(all labs ordered are listed, but only abnormal results are displayed) ?Labs Reviewed  ?BASIC METABOLIC PANEL - Abnormal; Notable for the following components:  ?    Result Value  ? Glucose, Bld 136 (*)   ? Calcium 10.5 (*)   ? All other components within normal limits  ?CBC WITH DIFFERENTIAL/PLATELET - Abnormal; Notable for the following components:  ? WBC 10.9 (*)   ? Hemoglobin 15.1 (*)   ? Neutro Abs 8.0 (*)   ? All other components within normal limits  ? ? ?EKG ?None ? ?Radiology ?DG Finger Index Right ? ?Result  Date: 09/04/2021 ?CLINICAL DATA:  Infection of the right index finger. EXAM: RIGHT INDEX FINGER 2+V COMPARISON:  None. FINDINGS: There is osteopenia with mild bone destruction of the proximal aspect of the second distal phalanx. Osteomyelitis is not excluded. Surrounding soft tissue swelling is noted. IMPRESSION: There is osteopenia with mild bone destruction of the proximal aspect of the second distal phalanx. Osteomyelitis is not excluded. Surrounding soft  tissue swelling is noted. No soft tissue gas is noted. Electronically Signed   By: Abelardo Diesel M.D.   On: 09/04/2021 11:38   ? ?Procedures ?Procedures  ? ? ?Medications Ordered in ED ?Medications  ?levofloxacin (LEVAQUIN) tablet 500 mg (500 mg Oral Given 09/04/21 1753)  ?metroNIDAZOLE (FLAGYL) tablet 500 mg (500 mg Oral Given 09/04/21 1753)  ? ? ?ED Course/ Medical Decision Making/ A&P ?  ?                        ?Medical Decision Making ?Patient with a finger infection from either a cat bite or scratch, she has been unable to tolerate doxycycline.  Seen by her PCP today at which time x-rays suggesting possible osteomyelitis.  There is no abscess appreciated, no paronychia that would warrant I&D of the site.  Given this injury occurred just 1 week ago, it is unlikely she could have developed osteomyelitis in such a short period of time.  Discussed with our pharmacy regarding appropriate antibiotic coverage, placed on levofloxacin and Flagyl for both aerobic and and aerobic coverage.  She was given her first dose here.  She was advised close follow-up with hand specialty, Dr. Aline Brochure, she will call in the morning for an appointment with him.  Basic labs obtained today are stable including a fair glucose level at 136.  She is current with her tetanus.  We also discussed rabies, patient states her cat is not showing any signs of being ill, this injury occurred 1 week ago, essentially 3 days out from a full 10-day quarantine.  She was advised to continue to observe the  cat for any signs of illness and she is aware that if there are any concerns she does need to proceed with rabies coverage.  At this point in time she does not appear to need this prophylaxis.  Discussed with D

## 2021-09-04 NOTE — ED Triage Notes (Addendum)
Pt c/o pain, redness and swelling to right index finger x one week; pt was bit by a cat a week ago on the area and has been on an antibiotic but has been vomiting since taking the meds; pt's PCP told pt to quit taking the antibiotic (doxycycline) ?

## 2021-09-04 NOTE — Telephone Encounter (Signed)
Please let the patient know that I sent their prescription to their pharmacy. Thanks, WS 

## 2021-09-06 ENCOUNTER — Ambulatory Visit: Payer: No Typology Code available for payment source | Admitting: Orthopedic Surgery

## 2021-09-06 ENCOUNTER — Encounter: Payer: Self-pay | Admitting: Orthopedic Surgery

## 2021-09-06 VITALS — BP 144/83 | HR 78 | Ht 62.0 in | Wt 161.0 lb

## 2021-09-06 DIAGNOSIS — M009 Pyogenic arthritis, unspecified: Secondary | ICD-10-CM

## 2021-09-06 DIAGNOSIS — M79644 Pain in right finger(s): Secondary | ICD-10-CM | POA: Diagnosis not present

## 2021-09-06 NOTE — Patient Instructions (Addendum)
You have been referred to see Dr. Sherilyn Cooter , the hand specialist in our [facility] office.[name] ? ?Their address is  ?[address] ?[place] ? ?Phone: [phone] ?You can call if you would like. The order is in. They should contact you to schedule an appointment. ?

## 2021-09-06 NOTE — Progress Notes (Signed)
Chief Complaint  ?Patient presents with  ? Hand Pain  ?  Right index finger, cat bite did not break the skin DOI 08/28/21  ? ? ?HPI: 61 year old female diabetic history of a stroke on Plavix but the stroke was 8 years ago she recovered nicely ? ?She still on the Plavix for that reason ? ?On 28 April she was playing with her cat it placed her its mouth over her right index finger where she had a mucous cyst since that time its been red swollen and erythematous.  She tried some doxycycline did not get better went to the emergency room they put her on Flagyl and Levaquin and sent her here for follow-up ? ?The finger has not improved the DIP joint has significant redness erythema and pain with painful range of motion over that joint ? ?The x-ray shows previous arthritis there is some streaking over the dorsum of the joint question etiology ? ? ? ?Past Medical History:  ?Diagnosis Date  ? Diabetes mellitus without complication ([place])   ? Hypertension   ? Stroke [facility] [place])   ? Vaginal Pap smear, abnormal   ? ? ?BP (!) 144/83   Pulse 78   Ht '5\' 2"'$  (1.575 m)   Wt 161 lb (73 kg)   BMI 29.45 kg/m?  ? ? ?General appearance: Well-developed well-nourished no gross deformities ? ?Cardiovascular normal pulse and perfusion normal color without edema ? ?Neurologically no sensation loss or deficits or pathologic reflexes ? ?Psychological: Awake alert and oriented x3 mood and affect normal ? ?Musculoskeletal: Right index finger with erythema painful range of motion tenderness over the DIP joint with swelling ? ?Imaging x-rays show some streaking or stranding over the joint with osteophytes and cystic structure in the distal phalanx ? ?A/P ? ?Encounter Diagnoses  ?Name Primary?  ? Pain in right finger(s) Yes  ? Infection of distal interphalangeal (DIP) joint of finger (HCC)   ? ? ? ?Continue Flagyl and Levaquin and muscular hand referral to make sure this does not need an I&D ?

## 2021-09-07 ENCOUNTER — Other Ambulatory Visit: Payer: No Typology Code available for payment source | Admitting: Adult Health

## 2021-09-12 ENCOUNTER — Encounter: Payer: Self-pay | Admitting: Orthopedic Surgery

## 2021-09-12 ENCOUNTER — Ambulatory Visit (INDEPENDENT_AMBULATORY_CARE_PROVIDER_SITE_OTHER): Payer: No Typology Code available for payment source | Admitting: Orthopedic Surgery

## 2021-09-12 DIAGNOSIS — W5501XA Bitten by cat, initial encounter: Secondary | ICD-10-CM | POA: Diagnosis not present

## 2021-09-12 DIAGNOSIS — S61258A Open bite of other finger without damage to nail, initial encounter: Secondary | ICD-10-CM

## 2021-09-12 MED ORDER — LEVOFLOXACIN 500 MG PO TABS: 500.0000 mg | ORAL_TABLET | Freq: Every day | ORAL | 0 refills | Status: AC

## 2021-09-12 MED ORDER — METRONIDAZOLE 500 MG PO TABS: 500.0000 mg | ORAL_TABLET | Freq: Two times a day (BID) | ORAL | 0 refills | Status: AC

## 2021-09-12 NOTE — Progress Notes (Signed)
? ?Office Visit Note ?  ?Patient: Marilyn Ford           ?Date of Birth: 16-Mar-1961           ?MRN: [id number] ?Visit Date: 09/12/2021 ?             ?Requested by: Carole Civil, MD ?[address] ?[place],  [place] [address] ?PCP: Claretta Fraise, MD ? ? ?Assessment & Plan: ?Visit Diagnoses:  ?1. Cat bite of index finger, initial encounter   ? ? ?Plan: Patient's swelling, erythema, and pain are significantly improved from previous.  She is on day 6/7 of oral antibiotics.  I will give her just a few more days given that she still has some mild erythema around the proximal nail fold.  She can come back and see me if she continues to have swelling or erythema. ? ?Follow-Up Instructions: No follow-ups on file.  ? ?Orders:  ?No orders of the defined types were placed in this encounter. ? ?Meds ordered this encounter  ?Medications  ? levofloxacin (LEVAQUIN) 500 MG tablet  ?  Sig: Take 1 tablet (500 mg total) by mouth daily for 5 days.  ?  Dispense:  5 tablet  ?  Refill:  0  ? metroNIDAZOLE (FLAGYL) 500 MG tablet  ?  Sig: Take 1 tablet (500 mg total) by mouth 2 (two) times daily for 5 days.  ?  Dispense:  10 tablet  ?  Refill:  0  ? ? ? ? Procedures: ?No procedures performed ? ? ?Clinical Data: ?No additional findings. ? ? ?Subjective: ?Chief Complaint  ?Patient presents with  ? Right Index Finger - Wound Check  ?  RIGHT handed, DOI: 08/28/21 cat bite, still some redness over distal phalanx  ? ? ?This is a 60 year old right-hand-dominant female presents for follow-up of a right index finger cat bite.  Date of injury was 09/01/2021.  She has been on antibiotics for the last 6 days now.  She has that previously her entire index finger was severely swollen with severe pain and erythema.  Since starting the antibiotics the swelling erythema has greatly improved.  She was previously unable to move her finger at all and now has near full range of motion.  She still has some mild erythema and swelling around the proximal  nail fold.  She denies any significant symptoms. ? ?Wound Check ? ? ?Review of Systems ? ? ?Objective: ?Vital Signs: BP 113/78 (BP Location: Left Arm, Patient Position: Sitting)   Pulse 88   Ht '5\' 2"'$  (1.575 m)   Wt 161 lb (73 kg)   BMI 29.45 kg/m?  ? ?Physical Exam ?Constitutional:   ?   Appearance: Normal appearance.  ?Cardiovascular:  ?   Rate and Rhythm: Normal rate.  ?   Pulses: Normal pulses.  ?Pulmonary:  ?   Effort: Pulmonary effort is normal.  ?Skin: ?   General: Skin is warm and dry.  ?   Capillary Refill: Capillary refill takes less than 2 seconds.  ?Neurological:  ?   Mental Status: She is alert.  ? ? ?Right Hand Exam  ? ?Tenderness  ?The patient is experiencing no tenderness.  ? ?Other  ?Erythema: present ?Sensation: normal ?Pulse: present ? ?Comments:  Mild swelling and erythema at distal aspect of index finger around DIPJ and proximal nail fold.  Full and painless ROM of finger.  No drainage or evidence of open wound.  ? ? ? ? ?Specialty Comments:  ?No specialty comments available. ? ?  Imaging: ?No results found. ? ? ?PMFS History: ?Patient Active Problem List  ? Diagnosis Date Noted  ? Cat bite of index finger 09/12/2021  ? Gastroesophageal reflux disease without esophagitis 10/20/2019  ? GAD (generalized anxiety disorder) 10/20/2019  ? Insomnia due to medical condition 10/20/2019  ? Hemiparesis affecting right side as late effect of cerebrovascular accident (CVA) ([place]) 04/22/2015  ? Diabetes mellitus type II, controlled ([place]) 12/23/2014  ? Hypertension 12/23/2014  ? ?Past Medical History:  ?Diagnosis Date  ? Diabetes mellitus without complication ([place])   ? Hypertension   ? Stroke [facility])   ? Vaginal Pap smear, abnormal   ?  ?Family History  ?Problem Relation Age of Onset  ? Diabetes Mother   ? Diabetes Brother   ? Breast cancer Neg Hx   ?  ?Past Surgical History:  ?Procedure Laterality Date  ? CESAREAN SECTION    ? CHOLECYSTECTOMY    ? COLONOSCOPY WITH PROPOFOL N/A 07/19/2020  ? Procedure:  COLONOSCOPY WITH PROPOFOL;  Surgeon: Harvel Quale, MD;  [facility];  Service: Gastroenterology;  Laterality: N/A;  AM  ? POLYPECTOMY  07/19/2020  ? Procedure: POLYPECTOMY;  Surgeon: Harvel Quale, MD;  Location: [facility];  Service: Gastroenterology;;  ? ?Social History  ? ?Occupational History  ? Occupation: disabled  ?Tobacco Use  ? Smoking status: Former  ?  Packs/day: 0.50  ?  Types: Cigarettes  ?  Start date: 12/08/1999  ?  Quit date: 03/12/2015  ?  Years since quitting: 6.5  ? Smokeless tobacco: Never  ?Vaping Use  ? Vaping Use: Never used  ?Substance and Sexual Activity  ? Alcohol use: Yes  ?  Alcohol/week: 1.0 standard drink  ?  Types: 1 Glasses of wine per week  ? Drug use: No  ? Sexual activity: Not Currently  ?  Birth control/protection: Post-menopausal  ? ? ? ? ? ? ?

## 2021-10-11 ENCOUNTER — Other Ambulatory Visit: Payer: No Typology Code available for payment source | Admitting: Adult Health

## 2021-10-17 ENCOUNTER — Telehealth: Payer: Self-pay | Admitting: Family Medicine

## 2021-10-17 ENCOUNTER — Other Ambulatory Visit: Payer: Self-pay | Admitting: Family Medicine

## 2021-10-17 DIAGNOSIS — K219 Gastro-esophageal reflux disease without esophagitis: Secondary | ICD-10-CM

## 2021-10-17 MED ORDER — PANTOPRAZOLE SODIUM 40 MG PO TBEC: DELAYED_RELEASE_TABLET | ORAL | 3 refills | Status: DC

## 2021-10-17 NOTE — Telephone Encounter (Signed)
Please schedule CCM f/u diabetes Will need to do paperwork for trulicity

## 2021-10-17 NOTE — Telephone Encounter (Signed)
Ated scrip sent to [facility]

## 2021-10-17 NOTE — Telephone Encounter (Signed)
Patient would like to talk to Almyra Free about going up on her Trulicity. Please call back.

## 2021-10-17 NOTE — Telephone Encounter (Signed)
Patient calling about pantoprazole (PROTONIX) 40 MG tablet because she thought she was supposed to be taking 2 a day but now she is out and [facility] said the prescription they have is only for 1 a day. Please call back and advise.

## 2021-10-18 NOTE — Telephone Encounter (Signed)
PATIENT AWARE

## 2021-10-25 ENCOUNTER — Other Ambulatory Visit: Payer: No Typology Code available for payment source | Admitting: Adult Health

## 2021-11-08 ENCOUNTER — Ambulatory Visit (INDEPENDENT_AMBULATORY_CARE_PROVIDER_SITE_OTHER): Payer: No Typology Code available for payment source | Admitting: Pharmacist

## 2021-11-08 DIAGNOSIS — E119 Type 2 diabetes mellitus without complications: Secondary | ICD-10-CM

## 2021-11-08 DIAGNOSIS — I10 Essential (primary) hypertension: Secondary | ICD-10-CM

## 2021-11-08 MED ORDER — TRULICITY 3 MG/0.5ML ~~LOC~~ SOAJ: 3.0000 mg | SUBCUTANEOUS | 2 refills | Status: DC

## 2021-11-08 NOTE — Patient Instructions (Addendum)
Visit Information  Following are the goals we discussed today:  Current Barriers:  Unable to independently afford treatment regimen Unable to maintain control of TD2M/WEIGHT LOSS  Pharmacist Clinical Goal(s):  Over the next 90 days, patient will verbalize ability to afford treatment regimen maintain control of T2DM/WEIGHT LOSS as evidenced by WEIGHT LOSS/CONTINUED [place]  through collaboration with PharmD and provider.   Interventions: 1:1 collaboration with Claretta Fraise, MD regarding development and update of comprehensive plan of care as evidenced by provider attestation and co-signature Inter-disciplinary care team collaboration (see longitudinal plan of care) Comprehensive medication review performed; medication list updated in electronic medical record  Diabetes: Controlled; current treatment: TRULICITY 1.'5mg'$  -->'3MG'$  sq weekly;  PATIENT WOULD LIKE ADDITIONAL WEIGHT LOSS--WILL INCREASE TO '3MG'$  WEEKLY--> GLYCEMIC CONTROL, CARDIO PROTECTION & WEIGHT LOSS WILL INCREASE TRULICITY TO '3MG'$  WEEKLY (NAUSEA RESOLVED) Denies personal and family history of Medullary thyroid cancer (MTC) A1C 6.7%, GFR 78 Current glucose readings: fasting glucose: <135, post prandial glucose: <180 Denies hypoglycemic/hyperglycemic symptoms Discussed meal planning options and Plate method for healthy eating Avoid sugary drinks and desserts Incorporate balanced protein, non starchy veggies, 1 serving of carbohydrate with each meal Increase water intake Increase physical activity as able Current exercise: N/A Educated on [place], reviewed meds for hypertension/BP controlled Assessed patient finances. ENROLLED FOR 2023 [facility] (trulicity to ship to patient's home)  Patient Goals/Self-Care Activities Over the next 90 days, patient will:  - take medications as prescribed WORK TO LOSE WEIGHT AND CONTINUE GLYCEMIC CONTROL  Follow Up  Plan: Telephone follow up appointment with care management team member scheduled for: 3 MONTHS   Plan: Telephone follow up appointment with care management team member scheduled for:  03/2022  Signature Regina Eck, PharmD, BCPS Clinical Pharmacist, [place]  II Phone [phone]   Please call the care guide team at [phone] if you need to cancel or reschedule your appointment.   The patient verbalized understanding of instructions, educational materials, and care plan provided today and DECLINED offer to receive copy of patient instructions, educational materials, and care plan.

## 2021-11-08 NOTE — Progress Notes (Signed)
Chronic Care Management Pharmacy Note  11/08/2021 Name:  Marilyn Ford MRN:  [id number] DOB:  12-Jun-1960  Summary:  Diabetes: Controlled; current treatment: TRULICITY 3.5TI -->1WE sq weekly;  PATIENT WOULD LIKE ADDITIONAL WEIGHT LOSS--WILL INCREASE TO 3MG WEEKLY--> GLYCEMIC CONTROL, CARDIO PROTECTION & WEIGHT LOSS WILL INCREASE TRULICITY TO 3MG WEEKLY (NAUSEA RESOLVED) Denies personal and family history of Medullary thyroid cancer (MTC) A1C 6.7%, GFR 78 Current glucose readings: fasting glucose: <135, post prandial glucose: <180 Denies hypoglycemic/hyperglycemic symptoms Discussed meal planning options and Plate method for healthy eating Avoid sugary drinks and desserts Incorporate balanced protein, non starchy veggies, 1 serving of carbohydrate with each meal Increase water intake Increase physical activity as able Current exercise: N/A Educated on TRULICITY INCREASE, reviewed meds for hypertension/BP controlled Assessed patient finances. ENROLLED FOR 2023 [facility] (trulicity to ship to patient's home)  Patient Goals/Self-Care Activities Over the next 90 days, patient will:  - take medications as prescribed WORK TO LOSE WEIGHT AND CONTINUE GLYCEMIC CONTROL  Follow Up Plan: Telephone follow up appointment with care management team member scheduled for: 3 MONTHS  Subjective: Marilyn Ford is an 61 y.o. year old female who is a primary patient of Stacks, Cletus Gash, MD.  The CCM team was consulted for assistance with disease management and care coordination needs.    Engaged with patient by telephone for follow up visit in response to provider referral for pharmacy case management and/or care coordination services.   Consent to Services:  The patient was given information about Chronic Care Management services, agreed to services, and gave verbal consent prior to initiation of services.  Please see initial visit note for detailed documentation.    Patient Care Team: Claretta Fraise, MD as PCP - General (Family Medicine) Claretta Fraise, MD (Family Medicine) Lavera Guise, [facility] as Pharmacist (Family Medicine) Montez Morita, Quillian Quince, MD as Consulting Physician (Gastroenterology) Aviva Signs, MD as Consulting Physician (General Surgery)  Objective:  Lab Results  Component Value Date   CREATININE 0.91 09/04/2021   CREATININE 1.03 (H) 08/22/2021   CREATININE 0.93 05/25/2021    Lab Results  Component Value Date   HGBA1C 6.7 (H) 08/22/2021   Last diabetic Eye exam:  Lab Results  Component Value Date/Time   HMDIABEYEEXA No Retinopathy 10/28/2020 12:00 AM    Last diabetic Foot exam: No results found for: "HMDIABFOOTEX"      Component Value Date/Time   CHOL 133 08/22/2021 1422   TRIG 212 (H) 08/22/2021 1422   HDL 38 (L) 08/22/2021 1422   CHOLHDL 3.5 08/22/2021 1422   CHOLHDL 7.7 03/13/2015 0438   VLDL UNABLE TO CALCULATE IF TRIGLYCERIDE OVER 400 mg/dL 03/13/2015 0438   LDLCALC 60 08/22/2021 1422       Latest Ref Rng & Units 08/22/2021    2:22 PM 05/25/2021    9:08 AM 02/20/2021    9:21 AM  Hepatic Function  Total Protein 6.0 - 8.5 g/dL 6.8  7.2  6.9   Albumin 3.8 - 4.9 g/dL 4.3  4.5  4.5   AST 0 - 40 IU/L 19  25  35   ALT 0 - 32 IU/L 24  34  39   Alk Phosphatase 44 - 121 IU/L 125  109  94   Total Bilirubin 0.0 - 1.2 mg/dL 0.5  0.5  0.5     No results found for: "TSH", "FREET4"     Latest Ref Rng & Units 09/04/2021    2:12 PM 08/22/2021  2:22 PM 05/25/2021    9:08 AM  CBC  WBC 4.0 - 10.5 K/uL 10.9  10.4  7.6   Hemoglobin 12.0 - 15.0 g/dL 15.1  13.7  13.9   Hematocrit 36.0 - 46.0 % 43.8  41.2  41.7   Platelets 150 - 400 K/uL 276  231  247     No results found for: "VD25OH"  Clinical ASCVD: No  The 10-year ASCVD risk score (Arnett DK, et al., 2019) is: 6.2%   Values used to calculate the score:     Age: 40 years     Sex: Female     Is Non-Hispanic African American: No     Diabetic: Yes      Tobacco smoker: No     Systolic Blood Pressure: 563 mmHg     Is BP treated: Yes     HDL Cholesterol: 38 mg/dL     Total Cholesterol: 133 mg/dL    Other: (CHADS2VASc if Afib, PHQ9 if depression, MMRC or CAT for COPD, ACT, DEXA)  Social History   Tobacco Use  Smoking Status Former   Packs/day: 0.50   Types: Cigarettes   Start date: 12/08/1999   Quit date: 03/12/2015   Years since quitting: 6.6  Smokeless Tobacco Never   BP Readings from Last 3 Encounters:  09/12/21 113/78  09/06/21 (!) 144/83  09/04/21 (!) 146/93   Pulse Readings from Last 3 Encounters:  09/12/21 88  09/06/21 78  09/04/21 81   Wt Readings from Last 3 Encounters:  09/12/21 161 lb (73 kg)  09/06/21 161 lb (73 kg)  09/04/21 165 lb 5.5 oz (75 kg)    Assessment: Review of patient past medical history, allergies, medications, health status, including review of consultants reports, laboratory and other test data, was performed as part of comprehensive evaluation and provision of chronic care management services.   SDOH:  (Social Determinants of Health) assessments and interventions performed:    CCM Care Plan  Allergies  Allergen Reactions   Penicillins Shortness Of Breath   Sulfa Antibiotics Rash    Medications Reviewed Today     Reviewed by Minda Ditto, Alyse Low, RT (Technologist) on 09/12/21 at 1355  Med List Status: <None>   Medication Order Taking? Sig Documenting Provider Last Dose Status Informant  Alcohol Swabs (B-D SINGLE USE SWABS REGULAR) PADS [id number] No Test BS daily and as needed Dx E11.9 Claretta Fraise, MD Taking Active   amLODipine (NORVASC) 10 MG tablet [id number] No 1 tablet daily Claretta Fraise, MD Taking Active   Apple Cider Vinegar 500 MG TABS [id number] No Take 500 mg by mouth daily. [provider] Taking Active Self  aspirin 81 MG EC tablet [id number] No Take 1 tablet (81 mg total) by mouth daily. Claretta Fraise, MD Taking Active Self  atorvastatin (LIPITOR) 80 MG tablet  [id number] No TAKE 1 TABLET EVERY DAY AT Nelva Bush, MD Taking Active   Blood Glucose Calibration (TRUE METRIX LEVEL 1) Low SOLN [id number] No Use with glucometer Dx E11.9 Claretta Fraise, MD Taking Active   Blood Glucose Monitoring Suppl (TRUE METRIX AIR GLUCOSE METER) w/Device KIT [id number] No Test BS daily and as needed Dx E11.9 Claretta Fraise, MD Taking Active   Calcium Carb-Cholecalciferol (CALCIUM-VITAMIN D) 600-400 MG-UNIT TABS [id number] No Take 1 tablet by mouth daily. [provider] Taking Active Self  clopidogrel (PLAVIX) 75 MG tablet [id number] No Take 1 tablet (75 mg total) by mouth daily. Claretta Fraise, MD Taking Active   Coenzyme Q10 (COQ10) 100 MG  CAPS [id number] No Take 100 mg by mouth daily. [provider] Taking Active Self  Cranberry 425 MG CAPS [id number] No Take 425 mg by mouth in the morning and at bedtime. [provider] Taking Active Self  Dulaglutide (TRULICITY) 1.5 JJ/0.0XF SOPN [id number] No Inject 1.5 mg into the skin once a week. Claretta Fraise, MD Taking Active   Flaxseed, Linseed, (FLAXSEED OIL) 1000 MG CAPS [id number] No Take 1,000 mg by mouth in the morning and at bedtime. [provider] Taking Active Self  glucose blood (TRUE METRIX BLOOD GLUCOSE TEST) test strip [id number] No Test BS daily and as needed Dx E11.9 Claretta Fraise, MD Taking Active   HYDROcodone-acetaminophen (NORCO/VICODIN) 5-325 MG tablet [id number] No Take 1 tablet by mouth every 6 (six) hours as needed. Evalee Jefferson, PA-C Taking Active   Krill Oil 500 MG CAPS [id number] No Take 3 capsules (1,500 mg total) by mouth in the morning and at bedtime. Claretta Fraise, MD Taking Active   levofloxacin (LEVAQUIN) 500 MG tablet [id number] No Take 1 tablet (500 mg total) by mouth daily. Evalee Jefferson, PA-C Taking Active   meloxicam (MOBIC) 15 MG tablet [id number] No Take 1 tablet (15 mg total) by mouth daily. For joint and muscle pain Claretta Fraise, MD Taking Active   metoprolol  (TOPROL-XL) 200 MG 24 hr tablet [id number] No TAKE 1 TABLET ONE TIME DAILY, WITH OR IMMEDIATELY FOLLOWING A MEAL Stacks, Warren, MD Taking Active   metroNIDAZOLE (FLAGYL) 500 MG tablet [id number] No Take 1 tablet (500 mg total) by mouth 2 (two) times daily. Evalee Jefferson, PA-C Taking Active   Multiple Vitamin (MULTIVITAMIN) capsule [id number] No Take 1 capsule by mouth daily. [provider] Taking Active Self  pantoprazole (PROTONIX) 40 MG tablet [id number] No TAKE 1 TABLET EVERY DAY FOR STOMACH Stacks, Warren, MD Taking Active   traZODone (DESYREL) 150 MG tablet [id number] No TAKE 1 OR 2 TABLETS AT BEDTIME FOR Karrie Doffing, MD Taking Active   TRUEplus Lancets 33G MISC [id number] No Test BS daily and as needed Dx E11.9 Claretta Fraise, MD Taking Active   valsartan (DIOVAN) 320 MG tablet [id number] No Take 1 tablet (320 mg total) by mouth daily. For blood pressure. Claretta Fraise, MD Taking Active   zinc gluconate 50 MG tablet [id number] No Take 50 mg by mouth daily. [provider] Taking Active Self            Patient Active Problem List   Diagnosis Date Noted   Cat bite of index finger 09/12/2021   Gastroesophageal reflux disease without esophagitis 10/20/2019   GAD (generalized anxiety disorder) 10/20/2019   Insomnia due to medical condition 10/20/2019   Hemiparesis affecting right side as late effect of cerebrovascular accident (CVA) ([place]) 04/22/2015   Diabetes mellitus type II, controlled ([place]) 12/23/2014   Hypertension 12/23/2014    Immunization History  Administered Date(s) Administered   Influenza, Quadrivalent, Recombinant, Inj, Pf 01/18/2019   Influenza,inj,Quad PF,6+ Mos 03/22/2015, 01/30/2017, 02/04/2018   Influenza-Unspecified 01/28/2016   PFIZER(Purple Top)SARS-COV-2 Vaccination 08/26/2019, 08/28/2019, 05/04/2020    Conditions to be addressed/monitored: DMII and CKD Stage 2  Care Plan : PHARMD MEDICATION MANAGEMENT  Updates made by Lavera Guise,  [place] since 11/08/2021 12:00 AM     Problem: DISEASE PROGRESSION PREVENTION      Long-Range Goal: T2DM   Recent Progress: On track  Priority: High  Note:   Current Barriers:  Unable to independently afford treatment regimen Unable to maintain control of TD2M/WEIGHT LOSS  Pharmacist Clinical  Goal(s):  Over the next 90 days, patient will verbalize ability to afford treatment regimen maintain control of T2DM/WEIGHT LOSS as evidenced by WEIGHT LOSS/CONTINUED GLYCEMIC CONTROL WITH ADDITION OF TRULICITY  through collaboration with PharmD and provider.   Interventions: 1:1 collaboration with Claretta Fraise, MD regarding development and update of comprehensive plan of care as evidenced by provider attestation and co-signature Inter-disciplinary care team collaboration (see longitudinal plan of care) Comprehensive medication review performed; medication list updated in electronic medical record  Diabetes: Controlled; current treatment: TRULICITY 3.9RV -->2YE sq weekly;  PATIENT WOULD LIKE ADDITIONAL WEIGHT LOSS--WILL INCREASE TO 3MG WEEKLY--> GLYCEMIC CONTROL, CARDIO PROTECTION & WEIGHT LOSS WILL INCREASE TRULICITY TO 3MG WEEKLY (NAUSEA RESOLVED) Denies personal and family history of Medullary thyroid cancer (MTC) A1C 6.7%, GFR 78 Current glucose readings: fasting glucose: <135, post prandial glucose: <180 Denies hypoglycemic/hyperglycemic symptoms Discussed meal planning options and Plate method for healthy eating Avoid sugary drinks and desserts Incorporate balanced protein, non starchy veggies, 1 serving of carbohydrate with each meal Increase water intake Increase physical activity as able Current exercise: N/A Educated on [place], reviewed meds for hypertension/BP controlled Assessed patient finances. ENROLLED FOR 2023 [facility] (trulicity to ship to patient's home)  Patient Goals/Self-Care Activities Over the next 90 days, patient will:  -  take medications as prescribed WORK TO LOSE WEIGHT AND CONTINUE GLYCEMIC CONTROL  Follow Up Plan: Telephone follow up appointment with care management team member scheduled for: 3 MONTHS      Medication Assistance:  TRULICITY obtained through [place] medication assistance program.  Enrollment ends 05/06/22  Follow Up:  Patient agrees to Care Plan and Follow-up.  Plan: Telephone follow up appointment with care management team member scheduled for:  3 MONTHS   Regina Eck, PharmD, BCPS Clinical Pharmacist, [place]  II Phone [phone]

## 2021-12-04 DIAGNOSIS — E119 Type 2 diabetes mellitus without complications: Secondary | ICD-10-CM

## 2021-12-04 DIAGNOSIS — I10 Essential (primary) hypertension: Secondary | ICD-10-CM | POA: Diagnosis not present

## 2021-12-05 ENCOUNTER — Encounter: Payer: Self-pay | Admitting: Family Medicine

## 2021-12-05 ENCOUNTER — Ambulatory Visit (INDEPENDENT_AMBULATORY_CARE_PROVIDER_SITE_OTHER): Payer: No Typology Code available for payment source | Admitting: Family Medicine

## 2021-12-05 VITALS — BP 136/79 | HR 87 | Temp 97.9°F | Ht 62.0 in | Wt 172.0 lb

## 2021-12-05 DIAGNOSIS — E119 Type 2 diabetes mellitus without complications: Secondary | ICD-10-CM | POA: Diagnosis not present

## 2021-12-05 DIAGNOSIS — E782 Mixed hyperlipidemia: Secondary | ICD-10-CM

## 2021-12-05 DIAGNOSIS — I10 Essential (primary) hypertension: Secondary | ICD-10-CM

## 2021-12-05 LAB — BAYER DCA HB A1C WAIVED: HB A1C (BAYER DCA - WAIVED): 6.1 % — ABNORMAL HIGH (ref 4.8–5.6)

## 2021-12-05 NOTE — Progress Notes (Signed)
Subjective:  Patient ID: Marilyn Ford,  female    DOB: November 10, 1960  Age: 61 y.o.    CC: Medical Management of Chronic Issues   HPI Marilyn Ford presents for  follow-up of hypertension. Patient has no history of headache chest pain or shortness of breath or recent cough. Patient also denies symptoms of TIA such as numbness weakness lateralizing. Patient denies side effects from medication. States taking it regularly.  Patient also  in for follow-up of elevated cholesterol. Doing well without complaints on current medication. Denies side effects  including myalgia and arthralgia and nausea. Also in today for liver function testing. Currently no chest pain, shortness of breath or other cardiovascular related symptoms noted.  Follow-up of diabetes. Patient does check blood sugar at home. Readings run between 100 and 150 Patient denies symptoms such as excessive hunger or urinary frequency, excessive hunger, nausea No significant hypoglycemic spells noted. Medications reviewed. Pt reports taking them regularly. Pt. denies complication/adverse reaction today.    History Marilyn Ford has a past medical history of Diabetes mellitus without complication ([place]), Hypertension, Stroke ([place]), and Vaginal Pap smear, abnormal.   She has a past surgical history that includes Cholecystectomy; Cesarean section; Colonoscopy with propofol (N/A, 07/19/2020); and polypectomy (07/19/2020).   Her family history includes Diabetes in her brother and mother.She reports that she quit smoking about 6 years ago. Her smoking use included cigarettes. She started smoking about 22 years ago. She smoked an average of .5 packs per day. She has never used smokeless tobacco. She reports current alcohol use of about 1.0 standard drink of alcohol per week. She reports that she does not use drugs.  Current Outpatient Medications on File Prior to Visit  Medication Sig Dispense Refill   Alcohol Swabs (B-D SINGLE USE SWABS REGULAR)  PADS Test BS daily and as needed Dx E11.9 100 each 3   amLODipine (NORVASC) 10 MG tablet 1 tablet daily 90 tablet 3   Apple Cider Vinegar 500 MG TABS Take 500 mg by mouth daily.     aspirin 81 MG EC tablet Take 1 tablet (81 mg total) by mouth daily. 30 tablet 0   atorvastatin (LIPITOR) 80 MG tablet TAKE 1 TABLET EVERY DAY AT 6PM 90 tablet 3   Blood Glucose Calibration (TRUE METRIX LEVEL 1) Low SOLN Use with glucometer Dx E11.9 3 each 0   Blood Glucose Monitoring Suppl (TRUE METRIX AIR GLUCOSE METER) w/Device KIT Test BS daily and as needed Dx E11.9 1 kit 0   Calcium Carb-Cholecalciferol (CALCIUM-VITAMIN D) 600-400 MG-UNIT TABS Take 1 tablet by mouth daily.     clopidogrel (PLAVIX) 75 MG tablet Take 1 tablet (75 mg total) by mouth daily. 90 tablet 3   Coenzyme Q10 (COQ10) 100 MG CAPS Take 100 mg by mouth daily.     Cranberry 425 MG CAPS Take 425 mg by mouth in the morning and at bedtime.     Dulaglutide (TRULICITY) 3 LD/3.5TS SOPN Inject 3 mg as directed once a week. 6 mL 2   Flaxseed, Linseed, (FLAXSEED OIL) 1000 MG CAPS Take 1,000 mg by mouth in the morning and at bedtime.     glucose blood (TRUE METRIX BLOOD GLUCOSE TEST) test strip Test BS daily and as needed Dx E11.9 100 each 3   Krill Oil 500 MG CAPS Take 3 capsules (1,500 mg total) by mouth in the morning and at bedtime.     metoprolol (TOPROL-XL) 200 MG 24 hr tablet TAKE 1 TABLET ONE TIME DAILY,  WITH OR IMMEDIATELY FOLLOWING A MEAL 90 tablet 3   Multiple Vitamin (MULTIVITAMIN) capsule Take 1 capsule by mouth daily.     pantoprazole (PROTONIX) 40 MG tablet TAKE 1 TABLET twice daily FOR STOMACH 180 tablet 3   traZODone (DESYREL) 150 MG tablet TAKE 1 OR 2 TABLETS AT BEDTIME FOR SLEEP 180 tablet 3   TRUEplus Lancets 33G MISC Test BS daily and as needed Dx E11.9 100 each 3   valsartan (DIOVAN) 320 MG tablet Take 1 tablet (320 mg total) by mouth daily. For blood pressure. 90 tablet 1   zinc gluconate 50 MG tablet Take 50 mg by mouth daily.      No current facility-administered medications on file prior to visit.    ROS Review of Systems  Constitutional: Negative.   HENT: Negative.    Eyes:  Negative for visual disturbance.  Respiratory:  Negative for shortness of breath.   Cardiovascular:  Positive for leg swelling (from bp meds). Negative for chest pain.  Gastrointestinal:  Negative for abdominal pain.  Musculoskeletal:  Negative for arthralgias.    Objective:  BP 136/79   Pulse 87   Temp 97.9 F (36.6 C)   Ht _0  (1.575 m)   Wt 172 lb (78 kg)   SpO2 96%   BMI 31.46 kg/m   BP Readings from Last 3 Encounters:  12/05/21 136/79  09/12/21 113/78  09/06/21 (!) 144/83    Wt Readings from Last 3 Encounters:  12/05/21 172 lb (78 kg)  09/12/21 161 lb (73 kg)  09/06/21 161 lb (73 kg)     Physical Exam Constitutional:      General: She is not in acute distress.    Appearance: She is well-developed.  Cardiovascular:     Rate and Rhythm: Normal rate and regular rhythm.  Pulmonary:     Breath sounds: Normal breath sounds.  Musculoskeletal:        General: No swelling. Normal range of motion.  Skin:    General: Skin is warm and dry.  Neurological:     Mental Status: She is alert and oriented to person, place, and time.     Diabetic Foot Exam - Simple   No data filed     Lab Results  Component Value Date   HGBA1C 6.7 (H) 08/22/2021   HGBA1C 6.7 (H) 05/25/2021   HGBA1C 6.2 (H) 02/20/2021    Assessment & Plan:   Marilyn Ford was seen today for medical management of chronic issues.  Diagnoses and all orders for this visit:  Type 2 diabetes mellitus without complication, without long-term current use of insulin ([place]) -     Bayer DCA Hb A1c Waived  Primary hypertension -     CMP14+EGFR -     CBC with Differential/Platelet  Mixed hyperlipidemia -     Lipid panel   I am having Marilyn Ford maintain her multivitamin, aspirin EC, Flaxseed Oil, Apple Cider Vinegar, Calcium-Vitamin D, zinc  gluconate, CoQ10, Cranberry, Krill Oil, True Metrix Air Glucose Meter, True Metrix Blood Glucose Test, TRUEplus Lancets 33G, True Metrix Level 1, B-D SINGLE USE SWABS REGULAR, amLODipine, atorvastatin, clopidogrel, metoprolol, traZODone, valsartan, pantoprazole, and Trulicity.  Support hose, elevate legs for edema. Will consider diuretic if swelling is intolerable, but at this time appears benign.   Follow-up: Return in about 3 months (around 03/07/2022).  Claretta Fraise, M.D.

## 2021-12-06 LAB — CBC WITH DIFFERENTIAL/PLATELET
Basophils Absolute: 0.1 10*3/uL (ref 0.0–0.2)
Basos: 1 %
EOS (ABSOLUTE): 0.2 10*3/uL (ref 0.0–0.4)
Eos: 3 %
Hematocrit: 40.9 % (ref 34.0–46.6)
Hemoglobin: 13.9 g/dL (ref 11.1–15.9)
Immature Grans (Abs): 0 10*3/uL (ref 0.0–0.1)
Immature Granulocytes: 0 %
Lymphocytes Absolute: 1.3 10*3/uL (ref 0.7–3.1)
Lymphs: 17 %
MCH: 30.5 pg (ref 26.6–33.0)
MCHC: 34 g/dL (ref 31.5–35.7)
MCV: 90 fL (ref 79–97)
Monocytes Absolute: 0.4 10*3/uL (ref 0.1–0.9)
Monocytes: 6 %
Neutrophils Absolute: 5.9 10*3/uL (ref 1.4–7.0)
Neutrophils: 73 %
Platelets: 237 10*3/uL (ref 150–450)
RBC: 4.55 x10E6/uL (ref 3.77–5.28)
RDW: 13.1 % (ref 11.7–15.4)
WBC: 8 10*3/uL (ref 3.4–10.8)

## 2021-12-06 LAB — CMP14+EGFR
ALT: 26 IU/L (ref 0–32)
AST: 23 IU/L (ref 0–40)
Albumin/Globulin Ratio: 1.6 (ref 1.2–2.2)
Albumin: 4.3 g/dL (ref 3.8–4.9)
Alkaline Phosphatase: 119 IU/L (ref 44–121)
BUN/Creatinine Ratio: 13 (ref 12–28)
BUN: 13 mg/dL (ref 8–27)
Bilirubin Total: 0.4 mg/dL (ref 0.0–1.2)
CO2: 26 mmol/L (ref 20–29)
Calcium: 9.5 mg/dL (ref 8.7–10.3)
Chloride: 98 mmol/L (ref 96–106)
Creatinine, Ser: 1.03 mg/dL — ABNORMAL HIGH (ref 0.57–1.00)
Globulin, Total: 2.7 g/dL (ref 1.5–4.5)
Glucose: 162 mg/dL — ABNORMAL HIGH (ref 70–99)
Potassium: 4.3 mmol/L (ref 3.5–5.2)
Sodium: 140 mmol/L (ref 134–144)
Total Protein: 7 g/dL (ref 6.0–8.5)
eGFR: 62 mL/min/{1.73_m2} (ref >59)

## 2021-12-06 LAB — LIPID PANEL
Chol/HDL Ratio: 3.5 ratio (ref 0.0–4.4)
Cholesterol, Total: 147 mg/dL (ref 100–199)
HDL: 42 mg/dL (ref >39)
LDL Chol Calc (NIH): 60 mg/dL (ref 0–99)
Triglycerides: 284 mg/dL — ABNORMAL HIGH (ref 0–149)
VLDL Cholesterol Cal: 45 mg/dL — ABNORMAL HIGH (ref 5–40)

## 2021-12-07 ENCOUNTER — Other Ambulatory Visit: Payer: No Typology Code available for payment source | Admitting: Adult Health

## 2021-12-08 NOTE — Progress Notes (Signed)
Hello Marilyn Ford,  Your lab result is normal and/or stable.Some minor variations that are not significant are commonly marked abnormal, but do not represent any medical problem for you.  Best regards, Claretta Fraise, M.D.

## 2022-01-07 ENCOUNTER — Other Ambulatory Visit: Payer: Self-pay | Admitting: Family Medicine

## 2022-01-17 ENCOUNTER — Other Ambulatory Visit: Payer: No Typology Code available for payment source | Admitting: Adult Health

## 2022-01-30 ENCOUNTER — Ambulatory Visit (INDEPENDENT_AMBULATORY_CARE_PROVIDER_SITE_OTHER): Payer: No Typology Code available for payment source | Admitting: General Surgery

## 2022-01-30 ENCOUNTER — Encounter: Payer: Self-pay | Admitting: General Surgery

## 2022-01-30 VITALS — BP 125/85 | HR 87 | Temp 98.9°F | Resp 14 | Ht 62.0 in | Wt 173.0 lb

## 2022-01-30 DIAGNOSIS — N6313 Unspecified lump in the right breast, lower outer quadrant: Secondary | ICD-10-CM

## 2022-01-30 NOTE — Patient Instructions (Signed)
Someone will call you with the plan for the mammogram / biopsy and tag.  The radiologist are going to look at the imaging and help with the plan.  Once we get the plan from radiology, we will plan for surgery. Once Dr. [name] has the images and pathology from any specimen if done, we can better plan for surgery or the next steps. She will call you at that time or if you prefer you can see her in person.

## 2022-01-30 NOTE — Progress Notes (Signed)
[facility] History and Physical  Reason for Referral:*** Referring Physician: ***  Chief Complaint   Follow-up     Marilyn Ford is a 61 y.o. female.  HPI: ***.  The *** started *** and has had a duration of ***.  It is associated with ***.  The *** is improved with ***, and is made worse with ***.    Quality*** Context***  Past Medical History:  Diagnosis Date  . Diabetes mellitus without complication ([place])   . Hypertension   . Stroke ([place])   . Vaginal Pap smear, abnormal     Past Surgical History:  Procedure Laterality Date  . CESAREAN SECTION    . CHOLECYSTECTOMY    . COLONOSCOPY WITH PROPOFOL N/A 07/19/2020   Procedure: COLONOSCOPY WITH PROPOFOL;  Surgeon: Harvel Quale, MD;  Location: [facility];  Service: Gastroenterology;  Laterality: N/A;  AM  . POLYPECTOMY  07/19/2020   Procedure: POLYPECTOMY;  Surgeon: Harvel Quale, MD;  Location: [facility];  Service: Gastroenterology;;    Family History  Problem Relation Age of Onset  . Diabetes Mother   . Diabetes Brother   . Breast cancer Neg Hx     Social History   Tobacco Use  . Smoking status: Former    Packs/day: 0.50    Types: Cigarettes    Start date: 12/08/1999    Quit date: 03/12/2015    Years since quitting: 6.8  . Smokeless tobacco: Never  Vaping Use  . Vaping Use: Never used  Substance Use Topics  . Alcohol use: Yes    Alcohol/week: 1.0 standard drink of alcohol    Types: 1 Glasses of wine per week  . Drug use: No    Medications: {medication reviewed/display:3041432} Allergies as of 01/30/2022       Reactions   Penicillins Shortness Of Breath   Sulfa Antibiotics Rash        Medication List        Accurate as of January 30, 2022  9:57 AM. If you have any questions, ask your nurse or doctor.          amLODipine 10 MG tablet Commonly known as: NORVASC 1 tablet daily   Apple Cider Vinegar 500 MG Tabs Take 500 mg by mouth daily.    aspirin EC 81 MG tablet Take 1 tablet (81 mg total) by mouth daily.   atorvastatin 80 MG tablet Commonly known as: LIPITOR TAKE 1 TABLET EVERY DAY AT 6PM   B-D SINGLE USE SWABS REGULAR Pads Test BS daily and as needed Dx E11.9   Calcium-Vitamin D 600-400 MG-UNIT Tabs Take 1 tablet by mouth daily.   clopidogrel 75 MG tablet Commonly known as: PLAVIX Take 1 tablet (75 mg total) by mouth daily.   CoQ10 100 MG Caps Take 100 mg by mouth daily.   Cranberry 425 MG Caps Take 425 mg by mouth in the morning and at bedtime.   Flaxseed Oil 1000 MG Caps Take 1,000 mg by mouth in the morning and at bedtime.   Krill Oil 500 MG Caps Take 3 capsules (1,500 mg total) by mouth in the morning and at bedtime.   metoprolol 200 MG 24 hr tablet Commonly known as: TOPROL-XL TAKE 1 TABLET ONE TIME DAILY, WITH OR IMMEDIATELY FOLLOWING A MEAL   multivitamin capsule Take 1 capsule by mouth daily.   pantoprazole 40 MG tablet Commonly known as: PROTONIX TAKE 1 TABLET twice daily FOR STOMACH   traZODone 150 MG tablet Commonly  known as: DESYREL TAKE 1 OR 2 TABLETS AT BEDTIME FOR SLEEP   True Metrix Air Glucose Meter w/Device Kit Test BS daily and as needed Dx E11.9   True Metrix Blood Glucose Test test strip Generic drug: glucose blood Test BS daily and as needed Dx E11.9   True Metrix Level 1 Low Soln Use with glucometer Dx E11.9   TRUEplus Lancets 33G Misc Test BS daily and as needed Dx C12.7   Trulicity 3 NT/7.0YF Sopn Generic drug: Dulaglutide Inject 3 mg as directed once a week.   valsartan 320 MG tablet Commonly known as: DIOVAN TAKE 1 TABLET (320 MG TOTAL) BY MOUTH DAILY. FOR BLOOD PRESSURE.   zinc gluconate 50 MG tablet Take 50 mg by mouth daily.         ROS:  {Review of Systems:30496}  Blood pressure 125/85, pulse 87, temperature 98.9 F (37.2 C), temperature source Oral, resp. rate 14, height '5\' 2"'  (1.575 m), weight 173 lb (78.5 kg), SpO2 97 %. 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 This was found to be discordant by Dr. Lajean Manes, with excision recommended.   Pathology results were discussed with the patient by telephone. The patient reported doing well after the biopsy with tenderness and swelling at the site. Post biopsy instructions and care were reviewed and questions were answered. The patient was encouraged to call [facility] for any additional concerns. My direct phone number was provided.   Request for surgical referral appointment sent to [facility] at [facility] in [place], [place], via [facility] message on April 07, 2021.   NOTE: Recommendation to bracket calcifications for excision.   Pathology results reported by Terie Purser, RN on 04/07/2021.     Electronically Signed   By: Lajean Manes M.D.   On: 04/07/2021 13:52   CLINICAL DATA:  Biopsy of lower outer right breast calcifications.   EXAM: RIGHT BREAST STEREOTACTIC CORE NEEDLE BIOPSY   COMPARISON:  Previous exams.   FINDINGS: The patient and I discussed the procedure of stereotactic-guided biopsy including benefits and alternatives. We discussed the high likelihood of a successful procedure. We discussed the risks of the procedure including infection, bleeding, tissue injury, clip migration, and inadequate sampling. Informed written consent was given. The usual time out protocol was performed immediately prior to the procedure.   Using sterile technique and 1% Lidocaine as local anesthetic, under stereotactic guidance, a 9 gauge vacuum assisted device was used to perform core needle biopsy of calcifications in the lower outer right breast using a lateral approach. Specimen radiograph was performed showing calcifications in 5/6 core  specimens. Specimens with calcifications are identified for pathology.   Lesion quadrant: Lower outer right breast   At the conclusion of the procedure, an X shaped tissue marker clip was deployed into the biopsy cavity. Follow-up 2-view mammogram was performed and dictated separately.   IMPRESSION: Stereotactic-guided biopsy of calcifications in the lower outer right breast. No apparent complications.   Electronically Signed: By: Dorise Bullion III M.D. On: 04/06/2021 15:25   CLINICAL DATA:  Screening recall for a right breast asymmetry and calcifications.   EXAM: DIGITAL DIAGNOSTIC UNILATERAL RIGHT MAMMOGRAM WITH TOMOSYNTHESIS AND CAD; ULTRASOUND RIGHT BREAST LIMITED   TECHNIQUE: Right digital diagnostic mammography and breast tomosynthesis was performed. The images were evaluated with computer-aided detection.; Targeted ultrasound examination  of the right breast was performed   COMPARISON:  Previous exam(s).   ACR Breast Density Category b: There are scattered areas of fibroglandular density.   FINDINGS: There coarse heterogeneous calcifications in the inferior right breast, at 6-7 o'clock, posterior depth, spanning 2.2 x 0.7 x 1.0 cm, associated with ill-defined opacity, but no defined mass. Calcifications are partly linearly arranged. There is no associated architectural distortion.   On physical exam, no mass is palpated in the inferior right breast. Patient is somewhat tender to ultrasound transducer pressure.   Targeted ultrasound is performed, showing a vague heterogeneous area mixed echogenicity with small echogenic foci suggesting calcifications in the right breast at 7 o'clock, 4 cm the nipple. Sonographic imaging of the right axilla shows normal lymph nodes. No enlarged or abnormal nodes.   IMPRESSION: 1. Suspicious 2.2 cm group of calcifications with associated asymmetric density in the inferior right breast at 6-7 o'clock. There is a vague  sonographic correlate, but the abnormality is best seen by mammography.   RECOMMENDATION: 1. Stereotactic core needle biopsy of the right breast calcifications and associated asymmetry. This procedure was scheduled prior to the patient being discharged from the breast Center.   I have discussed the findings and recommendations with the patient. If applicable, a reminder letter will be sent to the patient regarding the next appointment.   BI-RADS CATEGORY  4: Suspicious.     Electronically Signed   By: Lajean Manes M.D.   On: 03/27/2021 14:32   CLINICAL DATA:  Screening.   EXAM: DIGITAL SCREENING BILATERAL MAMMOGRAM WITH TOMOSYNTHESIS AND CAD   TECHNIQUE: Bilateral screening digital craniocaudal and mediolateral oblique mammograms were obtained. Bilateral screening digital breast tomosynthesis was performed. The images were evaluated with computer-aided detection.   COMPARISON:  Previous exam(s).   ACR Breast Density Category b: There are scattered areas of fibroglandular density.   FINDINGS: In the right breast, a possible asymmetry and calcifications warrants further evaluation. In the left breast, no findings suspicious for malignancy.   IMPRESSION: Further evaluation is suggested for possible asymmetry and calcifications in the right breast.   RECOMMENDATION: Diagnostic mammogram and possibly ultrasound of the right breast. (Code:FI-R-51M)   The patient will be contacted regarding the findings, and additional imaging will be scheduled.   BI-RADS CATEGORY  0: Incomplete. Need additional imaging evaluation and/or prior mammograms for comparison.     Electronically Signed   By: Lillia Mountain M.D.   On: 03/03/2021 12:39   Assessment & Plan:  Marilyn Ford is a 61 y.o. female with *** -*** -*** -Follow up ***  All questions were answered to the satisfaction of the patient and family***.  The risk and benefits of *** were discussed including but not  limited to ***.  After careful consideration, Marilyn Ford has decided to ***.    Virl Cagey 01/30/2022, 9:57 AM

## 2022-02-01 ENCOUNTER — Other Ambulatory Visit (HOSPITAL_COMMUNITY): Payer: Self-pay | Admitting: General Surgery

## 2022-02-01 DIAGNOSIS — R590 Localized enlarged lymph nodes: Secondary | ICD-10-CM

## 2022-02-01 DIAGNOSIS — R928 Other abnormal and inconclusive findings on diagnostic imaging of breast: Secondary | ICD-10-CM

## 2022-02-01 DIAGNOSIS — N6313 Unspecified lump in the right breast, lower outer quadrant: Secondary | ICD-10-CM

## 2022-02-05 ENCOUNTER — Other Ambulatory Visit: Payer: Self-pay | Admitting: Family Medicine

## 2022-02-05 ENCOUNTER — Telehealth: Payer: Self-pay | Admitting: Family Medicine

## 2022-02-05 MED ORDER — MELOXICAM 15 MG PO TABS: 15.0000 mg | ORAL_TABLET | Freq: Every day | ORAL | 5 refills | Status: DC

## 2022-02-05 NOTE — Telephone Encounter (Signed)
Patient said she stopped taking Celebrex because it was too expensive and thought something else was being called in to help her joints but the pharmacy does not have anything. Would like to speak to nurse about it. Please call back and advise.

## 2022-02-05 NOTE — Telephone Encounter (Signed)
Please let the patient know that I sent their prescription to their pharmacy. Thanks, WS 

## 2022-02-06 ENCOUNTER — Telehealth: Payer: Self-pay

## 2022-02-06 NOTE — Telephone Encounter (Signed)
This is the safest thing I can prescribe other than celebrex. There is some increased risk of bleeding.

## 2022-02-06 NOTE — Telephone Encounter (Signed)
Patient just picked up a prescription of Meloxicam at the pharmacy. They told her to check with PCP and make sure she can take it since it thins her blood and she is already on a blood thinner. Please advise

## 2022-02-06 NOTE — Progress Notes (Signed)
[facility] Quality Team Note  Name: Marilyn Ford Date of Birth: 01-May-1961 MRN: [id number] Date: 02/06/2022  [facility] Quality Team has reviewed this patient's chart, please see recommendations below:  [facility] Quality Other; (KED: Kidney Health Evaluation Gap- Patient needs Urine Albumin Creatinine Ratio Test completed for gap closure. EGFR has already been completed, Patient has upcoming appointment with [facility] 03/07/2022.)

## 2022-02-06 NOTE — Telephone Encounter (Signed)
No answer, mailbox full

## 2022-02-07 NOTE — Telephone Encounter (Signed)
Patient aware and verbalizes understanding. 

## 2022-02-15 ENCOUNTER — Ambulatory Visit: Payer: No Typology Code available for payment source | Admitting: General Surgery

## 2022-02-20 ENCOUNTER — Encounter (HOSPITAL_COMMUNITY): Payer: Self-pay

## 2022-02-20 ENCOUNTER — Ambulatory Visit (HOSPITAL_COMMUNITY)
Admission: RE | Admit: 2022-02-20 | Discharge: 2022-02-20 | Disposition: A | Discharge status: Home / Self Care | Payer: No Typology Code available for payment source | Source: Ambulatory Visit | Attending: General Surgery | Admitting: General Surgery

## 2022-02-20 ENCOUNTER — Other Ambulatory Visit (HOSPITAL_COMMUNITY): Payer: Self-pay | Admitting: General Surgery

## 2022-02-20 DIAGNOSIS — R590 Localized enlarged lymph nodes: Secondary | ICD-10-CM | POA: Insufficient documentation

## 2022-02-20 DIAGNOSIS — R928 Other abnormal and inconclusive findings on diagnostic imaging of breast: Secondary | ICD-10-CM

## 2022-02-20 DIAGNOSIS — C801 Malignant (primary) neoplasm, unspecified: Secondary | ICD-10-CM | POA: Diagnosis not present

## 2022-02-20 DIAGNOSIS — C773 Secondary and unspecified malignant neoplasm of axilla and upper limb lymph nodes: Secondary | ICD-10-CM | POA: Diagnosis not present

## 2022-02-20 DIAGNOSIS — R921 Mammographic calcification found on diagnostic imaging of breast: Secondary | ICD-10-CM | POA: Diagnosis not present

## 2022-03-01 ENCOUNTER — Ambulatory Visit (INDEPENDENT_AMBULATORY_CARE_PROVIDER_SITE_OTHER): Payer: No Typology Code available for payment source | Admitting: Adult Health

## 2022-03-01 ENCOUNTER — Telehealth: Payer: Self-pay | Admitting: Family Medicine

## 2022-03-01 ENCOUNTER — Encounter: Payer: Self-pay | Admitting: Adult Health

## 2022-03-01 ENCOUNTER — Other Ambulatory Visit (HOSPITAL_COMMUNITY)
Admission: RE | Admit: 2022-03-01 | Discharge: 2022-03-01 | Disposition: A | Discharge status: Home / Self Care | Payer: No Typology Code available for payment source | Source: Ambulatory Visit | Attending: Adult Health | Admitting: Adult Health

## 2022-03-01 VITALS — BP 127/79 | HR 95 | Ht 62.0 in | Wt 175.0 lb

## 2022-03-01 DIAGNOSIS — Z01419 Encounter for gynecological examination (general) (routine) without abnormal findings: Secondary | ICD-10-CM | POA: Diagnosis not present

## 2022-03-01 DIAGNOSIS — Z1151 Encounter for screening for human papillomavirus (HPV): Secondary | ICD-10-CM | POA: Diagnosis not present

## 2022-03-01 DIAGNOSIS — Z1211 Encounter for screening for malignant neoplasm of colon: Secondary | ICD-10-CM | POA: Diagnosis not present

## 2022-03-01 DIAGNOSIS — Z124 Encounter for screening for malignant neoplasm of cervix: Secondary | ICD-10-CM | POA: Diagnosis not present

## 2022-03-01 LAB — HEMOCCULT GUIAC POC 1CARD (OFFICE): Fecal Occult Blood, POC: NEGATIVE

## 2022-03-01 NOTE — Progress Notes (Signed)
Subjective:     Patient ID: Marilyn Ford, female   DOB: [birth year], 61 y.o.   MRN: [id number]  HPI Marilyn Ford is a 61 year old white female, married, PM in for pelvic and pap. She had physical with PCP, and  recently had right axilla lymph node be +cancer. She has MRI tomorrow and sees Dr [name] next week she says.   PCP is Dr Livia Snellen.  Review of Systems Denies any vaginal bleeding Is not sexually active in over 7 years Reviewed past medical,surgical, social and family history. Reviewed medications and allergies.     Objective:   Physical Exam BP 127/79 (BP Location: Right Arm, Patient Position: Sitting, Cuff Size: Normal)   Pulse 95   Ht '5\' 2"'$  (1.575 m)   Wt 175 lb (79.4 kg)   BMI 32.01 kg/m     Skin warm and dry. Lungs: clear to ausculation bilaterally. Cardiovascular: regular rate and rhythm. Pelvic: external genitalia is normal in appearance no lesions, vagina: pale with loss of moisture and rugae,urethra has no lesions or masses noted, cervix: smooth,pap with HR HPV genotyping performed,uterus: normal size, shape and contour, non tender, no masses felt, adnexa: no masses or tenderness noted. Bladder is non tender and no masses felt. ON rectal exam has good tone,no masses felt and hemoccult was negative.  AA is 2    03/01/2022    2:28 PM 12/05/2021    8:24 AM 12/05/2021    8:20 AM  Depression screen PHQ 2/9  Decreased Interest 1 1 0  Down, Depressed, Hopeless 3 1 0  PHQ - 2 Score 4 2 0  Altered sleeping 3 2   Tired, decreased energy 3 1   Change in appetite 1 0   Feeling bad or failure about yourself  1 0   Trouble concentrating 1 1   Moving slowly or fidgety/restless 1 1   Suicidal thoughts 0 0   PHQ-9 Score 14 7   Difficult doing work/chores  Not difficult at all    She is on meds     03/01/2022    2:28 PM 12/05/2021    8:26 AM 08/22/2021    2:31 PM 05/25/2021    9:04 AM  GAD 7 : Generalized Anxiety Score  Nervous, Anxious, on Edge 3 2 0 0  Control/stop worrying 3 0  1 2  Worry too much - different things '3 1 1 2  '$ Trouble relaxing 3 1 0 1  Restless 3 0 0 1  Easily annoyed or irritable '3 1 1 1  '$ Afraid - awful might happen 3 0 0 0  Total GAD 7 Score '21 5 3 7  '$ Anxiety Difficulty  Somewhat difficult Somewhat difficult Somewhat difficult    Upstream - 03/01/22 1429       Pregnancy Intention Screening   Does the patient want to become pregnant in the next year? No    Does the patient's partner want to become pregnant in the next year? No    Would the patient like to discuss contraceptive options today? No      Contraception Wrap Up   Current Method Abstinence    End Method Abstinence    Contraception Counseling Provided No            Examination chaperoned by Levy Pupa LPN   Assessment:      1. Routine cervical smear Pap sent Pap in 3 years if negative Physical with PCP   2. Encounter for screening fecal occult blood testing Hemoccult  was negative     Plan:     Pap in 3 years if negative

## 2022-03-01 NOTE — Telephone Encounter (Signed)
  Prescription Request  03/01/2022  Is this a "Controlled Substance" medicine? ?  Have you seen your PCP in the last 2 weeks? no  If YES, route message to pool  -  If NO, patient needs to be scheduled for appointment.  What is the name of the medication or equipment? Pt needs something mild called in to relax her for her mri tomorrow  Have you contacted your pharmacy to request a refill? no   Which pharmacy would you [facility] this sent to? [facility]   Patient notified that their request is being sent to the clinical staff for review and that they should receive a response within 2 business days.

## 2022-03-02 ENCOUNTER — Other Ambulatory Visit: Payer: Self-pay | Admitting: Family Medicine

## 2022-03-02 ENCOUNTER — Other Ambulatory Visit: Payer: Self-pay | Admitting: *Deleted

## 2022-03-02 ENCOUNTER — Ambulatory Visit (HOSPITAL_COMMUNITY)
Admission: RE | Admit: 2022-03-02 | Discharge: 2022-03-02 | Disposition: A | Discharge status: Home / Self Care | Payer: No Typology Code available for payment source | Source: Ambulatory Visit | Attending: General Surgery | Admitting: General Surgery

## 2022-03-02 DIAGNOSIS — R590 Localized enlarged lymph nodes: Secondary | ICD-10-CM | POA: Insufficient documentation

## 2022-03-02 DIAGNOSIS — N6489 Other specified disorders of breast: Secondary | ICD-10-CM | POA: Diagnosis not present

## 2022-03-02 DIAGNOSIS — N6313 Unspecified lump in the right breast, lower outer quadrant: Secondary | ICD-10-CM

## 2022-03-02 MED ORDER — GADOBUTROL 1 MMOL/ML IV SOLN
8.0000 mL | Freq: Once | INTRAVENOUS | Status: AC | PRN
  Administered 2022-03-02: 8 mL via INTRAVENOUS

## 2022-03-02 MED ORDER — LORAZEPAM 1 MG PO TABS: ORAL_TABLET | ORAL | 0 refills | Status: DC

## 2022-03-02 NOTE — Telephone Encounter (Signed)
Please let the patient know that I sent their prescription to their pharmacy. Thanks, WS 

## 2022-03-05 ENCOUNTER — Telehealth: Payer: Self-pay | Admitting: Hematology and Oncology

## 2022-03-05 DIAGNOSIS — C50811 Malignant neoplasm of overlapping sites of right female breast: Secondary | ICD-10-CM | POA: Insufficient documentation

## 2022-03-05 NOTE — Progress Notes (Signed)
New Breast Cancer Diagnosis: Right Breast LOQ  Did patient present with symptoms (if so, please note symptoms) or screening mammography?: The patient was being followed for calcifications in the right breast.  She had a biopsy in October 2022 that showed benign calcifications though this was felt to be discordant.  She was offered surgery but rather has been followed with diagnostic imaging. Her most recent diagnostic mammogram showed suspicious changes of the skin of the right breast with asymmetry in the central breast.    Location and Extent of disease :right breast,  2.2 cm group of calcifications with associated asymmetric density in the inferior right breast at 6-7 o'clock.  Adenopathy yes.   Histology per Pathology Report: grade ?, 02/20/2022  Receptor Status: ER(Pending), PR (Pending), Her2-neu (Pending), Ki-(Pending%)   Surgeon and surgical plan, if any:  Dr. [name] 01/30/2022 - I discussed her case with Dr. Shelly Bombard and discussed my concerns. I think she needs repeat diagnostic imaging and potential additional biopsy if anything has changed or the axilla looks different.  -Discussed tag placement if not major changes on the diagnostic so the patient does not have to go there more than once. The recommendations for before was for a bracket tag.  -Discussed concern that this could be a cancer but nothing is proven at this time and we are working it up more to make sure the best operation is done initially given all of the  delays.    Medical oncologist, treatment if any:   Dr. Chryl Heck 03/16/2022   Family History of Breast/Ovarian/Prostate Cancer: Maternal Aunt had breast cancer, Mom had Cervical cancer.  Lymphedema issues, if any: She reports some swelling in the breast.     Pain issues, if any: She continues to have some tenderness in the breast.     SAFETY ISSUES: Prior radiation? No Pacemaker/ICD? No Possible current pregnancy? Postmenopausal Is the patient on methotrexate?  No  Current Complaints / other details:

## 2022-03-05 NOTE — Telephone Encounter (Signed)
Scheduled appt per 10/27 referral. Pt is aware of appt date and time. Pt is aware to arrive 15 mins prior to appt time and to bring and updated insurance card. Pt is aware of appt location.   

## 2022-03-05 NOTE — Progress Notes (Signed)
Radiation Oncology         ([phone]) [phone] ________________________________  Name: Marilyn Ford        MRN: [id number]  Date of Service: 03/06/2022 DOB: 05/29/1960  CW:[id number], Cletus Gash, MD  Virl Cagey, MD     REFERRING PHYSICIAN: Virl Cagey, MD   DIAGNOSIS: There were no encounter diagnoses.   HISTORY OF PRESENT ILLNESS: Marilyn Ford is a 61 y.o. female seen for a new diagnosis of right breast cancer. The patient was being followed for calcifications in the right breast.  She had a biopsy in October 2022 that showed benign calcifications though this was felt to be discordant.  She was offered surgery but rather has been followed with diagnostic imaging.  Her most recent diagnostic mammogram on 02/20/2022 showed suspicious changes of the skin of the right breast with asymmetry in the central right breast.  By ultrasound the skin thickening and subcutaneous edema throughout the right breast was seen and 1 cm intramammary lymph node with nodular cortical thickening was seen in the 9:00 right breast.  This corresponded to the mass seen on mammography.  The entire upper and lower central right breast was scanned and no definitive masses or other sonographic abnormalities were seen.  Targeted ultrasound of the right axilla showed at least 3 morphologically abnormal appearing lymph nodes with massive cortical thickening.  She underwent biopsy that same day, and while the intramammary lymph node  was negative for carcinoma, her right axillary node that was sampled was positive for carcinoma consistent with breast primary. Grade was not given. Her prognostic panel ***. She desires care in [place]. She's getting set up by the breast navigators to see surgery and medical oncology as well. She's seen today to discuss treatment recommendations of her cancer.     PREVIOUS RADIATION THERAPY: {EXAM; YES/NO:19492::"No"}   PAST MEDICAL HISTORY:  Past Medical History:  Diagnosis Date    Breast cancer ([place])    Diabetes mellitus without complication ([place])    Hypertension    Stroke ([place])    Vaginal Pap smear, abnormal        PAST SURGICAL HISTORY: Past Surgical History:  Procedure Laterality Date   CESAREAN SECTION     CHOLECYSTECTOMY     COLONOSCOPY WITH PROPOFOL N/A 07/19/2020   Procedure: COLONOSCOPY WITH PROPOFOL;  Surgeon: Harvel Quale, MD;  [facility];  Service: Gastroenterology;  Laterality: N/A;  AM   POLYPECTOMY  07/19/2020   Procedure: POLYPECTOMY;  Surgeon: Harvel Quale, MD;  Location: [facility];  Service: Gastroenterology;;     FAMILY HISTORY:  Family History  Problem Relation Age of Onset   Diabetes Mother    Diabetes Brother    Breast cancer Neg Hx      SOCIAL HISTORY:  reports that she quit smoking about 6 years ago. Her smoking use included cigarettes. She started smoking about 22 years ago. She smoked an average of .5 packs per day. She has never used smokeless tobacco. She reports current alcohol use of about 1.0 standard drink of alcohol per week. She reports that she does not use drugs.  The patient is married and lives in [place], [place].   ALLERGIES: Penicillins and Sulfa antibiotics   MEDICATIONS:  Current Outpatient Medications  Medication Sig Dispense Refill   Alcohol Swabs (B-D SINGLE USE SWABS REGULAR) PADS Test BS daily and as needed Dx E11.9 100 each 3   amLODipine (NORVASC) 10 MG tablet 1 tablet daily 90 tablet  3   Apple Cider Vinegar 500 MG TABS Take 500 mg by mouth daily.     aspirin 81 MG EC tablet Take 1 tablet (81 mg total) by mouth daily. 30 tablet 0   atorvastatin (LIPITOR) 80 MG tablet TAKE 1 TABLET EVERY DAY AT 6PM 90 tablet 3   Blood Glucose Calibration (TRUE METRIX LEVEL 1) Low SOLN Use with glucometer Dx E11.9 3 each 0   Blood Glucose Monitoring Suppl (TRUE METRIX AIR GLUCOSE METER) w/Device KIT Test BS daily and as needed Dx E11.9 1 kit 0   Calcium  Carb-Cholecalciferol (CALCIUM-VITAMIN D) 600-400 MG-UNIT TABS Take 1 tablet by mouth daily.     clopidogrel (PLAVIX) 75 MG tablet Take 1 tablet (75 mg total) by mouth daily. 90 tablet 3   Coenzyme Q10 (COQ10) 100 MG CAPS Take 100 mg by mouth daily.     Cranberry 425 MG CAPS Take 425 mg by mouth in the morning and at bedtime.     Dulaglutide (TRULICITY) 3 JQ/4.9EE SOPN Inject 3 mg as directed once a week. 6 mL 2   Flaxseed, Linseed, (FLAXSEED OIL) 1000 MG CAPS Take 1,000 mg by mouth in the morning and at bedtime.     glucose blood (TRUE METRIX BLOOD GLUCOSE TEST) test strip Test BS daily and as needed Dx E11.9 100 each 3   Krill Oil 500 MG CAPS Take 3 capsules (1,500 mg total) by mouth in the morning and at bedtime.     LORazepam (ATIVAN) 1 MG tablet Take one, on3 hour befroe your MRI 1 tablet 0   meloxicam (MOBIC) 15 MG tablet Take 1 tablet (15 mg total) by mouth daily. For joint and muscle pain 30 tablet 5   metoprolol (TOPROL-XL) 200 MG 24 hr tablet TAKE 1 TABLET ONE TIME DAILY, WITH OR IMMEDIATELY FOLLOWING A MEAL 90 tablet 3   Multiple Vitamin (MULTIVITAMIN) capsule Take 1 capsule by mouth daily.     pantoprazole (PROTONIX) 40 MG tablet TAKE 1 TABLET twice daily FOR STOMACH 180 tablet 3   traZODone (DESYREL) 150 MG tablet TAKE 1 OR 2 TABLETS AT BEDTIME FOR SLEEP 180 tablet 3   TRUEplus Lancets 33G MISC Test BS daily and as needed Dx E11.9 100 each 3   valsartan (DIOVAN) 320 MG tablet TAKE 1 TABLET (320 MG TOTAL) BY MOUTH DAILY. FOR BLOOD PRESSURE. 90 tablet 1   zinc gluconate 50 MG tablet Take 50 mg by mouth daily.     No current facility-administered medications for this visit.     REVIEW OF SYSTEMS: On review of systems, the patient reports that she is doing ***     PHYSICAL EXAM:  Wt Readings from Last 3 Encounters:  03/01/22 175 lb (79.4 kg)  01/30/22 173 lb (78.5 kg)  12/05/21 172 lb (78 kg)   Temp Readings from Last 3 Encounters:  01/30/22 98.9 F (37.2 C) (Oral)   12/05/21 97.9 F (36.6 C)  09/04/21 97.6 F (36.4 C) (Oral)   BP Readings from Last 3 Encounters:  03/01/22 127/79  01/30/22 125/85  12/05/21 136/79   Pulse Readings from Last 3 Encounters:  03/01/22 95  01/30/22 87  12/05/21 87    In general this is a well appearing Caucasian female in no acute distress. She's alert and oriented x4 and appropriate throughout the examination. Cardiopulmonary assessment is negative for acute distress and she exhibits normal effort. Bilateral breast exam is deferred.    ECOG = ***  0 - Asymptomatic (Fully active, able  to carry on all predisease activities without restriction)  1 - Symptomatic but completely ambulatory (Restricted in physically strenuous activity but ambulatory and able to carry out work of a light or sedentary nature. For example, light housework, office work)  2 - Symptomatic, <50% in bed during the day (Ambulatory and capable of all self care but unable to carry out any work activities. Up and about more than 50% of waking hours)  3 - Symptomatic, >50% in bed, but not bedbound (Capable of only limited self-care, confined to bed or chair 50% or more of waking hours)  4 - Bedbound (Completely disabled. Cannot carry on any self-care. Totally confined to bed or chair)  5 - Death   Eustace Pen MM, Creech RH, Tormey DC, et al. [phone]). "Toxicity and response criteria of the [facility]". [place] Oncol. 5 (6): 649-55    LABORATORY DATA:  Lab Results  Component Value Date   WBC 8.0 12/05/2021   HGB 13.9 12/05/2021   HCT 40.9 12/05/2021   MCV 90 12/05/2021   PLT 237 12/05/2021   Lab Results  Component Value Date   NA 140 12/05/2021   K 4.3 12/05/2021   CL 98 12/05/2021   CO2 26 12/05/2021   Lab Results  Component Value Date   ALT 26 12/05/2021   AST 23 12/05/2021   ALKPHOS 119 12/05/2021   BILITOT 0.4 12/05/2021      RADIOGRAPHY: MR Breast Bilateral W Wo Contrast  Result Date:  03/05/2022 CLINICAL DATA:  61 year old female with recent biopsy abnormal right axillary lymph nodes. Pathology indicated metastatic lymph nodes with uncertain primary. She also had a lymph node biopsied in the right breast at 9 o'clock (ribbon clip) which was benign and concordant. The patient has not had any diagnosed cancer within the right breast. She did have a stereotactic biopsy of right breast calcifications in December of 2022 (X clip) which were benign but discordant and surgical excision was recommended. Surgery has not been performed. The patient was recently placed on antibiotics for possible right breast mastitis (redness and swelling of the right breast). EXAM: BILATERAL BREAST MRI WITH AND WITHOUT CONTRAST TECHNIQUE: Multiplanar, multisequence MR images of both breasts were obtained prior to and following the intravenous administration of 8 ml of Gadavist Three-dimensional MR images were rendered by post-processing of the original MR data on an independent workstation. The three-dimensional MR images were interpreted, and findings are reported in the following complete MRI report for this study. Three dimensional images were evaluated at the independent interpreting workstation using the DynaCAD thin client. COMPARISON:  No prior MRI available for comparison. Correlation made with prior mammogram and ultrasound images. FINDINGS: Breast composition: b. Scattered fibroglandular tissue. Background parenchymal enhancement: Minimal Right breast: In the slightly upper-outer right breast, mid to posterior depth there is patchy clumped non mass enhancement in a linear orientation spanning approximately 3.8 cm (series 15, images 26-37). Susceptibility artifact from the biopsy marking clip is seen just below the level of this enhancement (series 2, image 30). There is diffuse thickening of the skin in the right breast with skin enhancement. Left breast: No mass or abnormal enhancement. Lymph nodes: There are  numerous bulky matted lymph nodes in the right axilla. Susceptibility artifact within one of these lymph nodes corresponds with the biopsy marking clip from her recent biopsy. Ancillary findings:  None. IMPRESSION: 1. There is a suspicious 3.8 cm area of patchy non mass enhancement in a linear orientation in the slightly upper  outer right breast in the mid to posterior depth spanning 3.8 cm. 2. Diffuse skin thickening with enhancement of the skin, concerning for inflammatory breast cancer. 3. Numerous bulky matted lymph nodes in the right axilla, one of which corresponds with the biopsy-proven metastatic lymph node. 4.  No evidence of left breast malignancy. RECOMMENDATION: 1. MRI guided biopsy is recommended for the anterior and posterior aspect of the patchy non mass enhancement in the upper-outer right breast. 2. Consider skin punch biopsy to evaluate for inflammatory breast cancer given the skin thickening and enhancement if the patient's symptoms do not entirely resolve with antibiotics. BI-RADS CATEGORY  4: Suspicious. Electronically Signed   By: Ammie Ferrier M.D.   On: 03/05/2022 06:57  [place] RT BREAST BX W LOC DEV 1ST LESION IMG BX SPEC US GUIDE  Addendum Date: 02/28/2022   ADDENDUM REPORT: 02/28/2022 10:30 ADDENDUM: PATHOLOGY revealed: Site A. AXILLA, RIGHT, LYMPH NODE, NEEDLE CORE BIOPSY: - Positive for carcinoma. 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 No lymphoid tissue is identified. Clinical and radiologic correlation is suggested. This case was reviewed with Dr. Vic Ripper who agrees with the above interpretation. Pathology results are CONCORDANT with imaging findings, per Dr. Everlean Alstrom. PATHOLOGY revealed: Site B. LYMPH NODE, RIGHT BREAST, BIOPSY: - Negative for carcinoma.  Pathology results are CONCORDANT with imaging findings, per Dr. Everlean Alstrom. Pathology results and recommendations below were discussed with patient by telephone on 02/28/2022. Patient reported biopsy site within normal limits with slight tenderness at the site. Post biopsy care instructions were reviewed, questions were answered and my direct phone number was provided to patient. Patient was instructed to call [facility] Mammography Department if any concerns or questions arise related to the biopsy. RECOMMENDATION: 1. Surgical consultation followup with provider/surgeon (Dr. Curlene Labrum). Biopsy results were called to provider office and report given to [facility] Six LPN. Carmell Austria will contact patient with appointment to discuss results with provider/surgeon. Referral was also sent to Kathi Der RT at [facility] Mammography Department, as patient requested to talk with [facility] regarding surgical and oncological referrals. 2. Patient has bilateral breast MRI scheduled for 03/02/2022 at 12 noon, and patient is aware. Pathology results reported by Electa Sniff RN on 02/27/2022. Electronically Signed   By: Everlean Alstrom M.D.   On: 02/28/2022 10:30   Addendum Date: 02/22/2022   ADDENDUM REPORT: 02/22/2022 16:38 ADDENDUM: An addendum is made to this report following a discussion with the interpreting pathologist 02/22/2022 at 4:30 p.m. Note that the specimen containers were NOT incorrectly labeled, and in fact were correct. The right axillary lymph node at biopsy site 1 was labeled correctly, with the samples placed in the container labeled 1. The right breast 9 o'clock lymph node at biopsy site 2 was labeled correctly, with the samples placed in the container labeled 2. Electronically Signed   By: Everlean Alstrom M.D.   On: 02/22/2022 16:38   Addendum Date: 02/22/2022   ADDENDUM REPORT: 02/22/2022 14:24 ADDENDUM: An addendum is made to this report following a  discussion regarding the specimens with Candice from pathology. Note that the specimen containers were incorrectly labeled/switched. The right axilla lymph node (biopsy site 1 per report and images) is located within specimen container labeled 2. The right breast 9 o'clock lymph node (biopsy site 2 per report and images) is located within  specimen container labeled 1. The right axillary lymph node with sent in both saline and formalin. Electronically Signed   By: Everlean Alstrom M.D.   On: 02/22/2022 14:24   Result Date: 02/28/2022 CLINICAL DATA:  Patient presents for ultrasound-guided core biopsy of an abnormal lymph node in the right axilla as well as indeterminate lymph node in the right breast at the 9 o'clock position. EXAM: ULTRASOUND GUIDED RIGHT BREAST CORE NEEDLE BIOPSY COMPARISON:  Previous exam(s). PROCEDURE: I met with the patient and we discussed the procedure of ultrasound-guided biopsy, including benefits and alternatives. We discussed the high likelihood of a successful procedure. We discussed the risks of the procedure, including infection, bleeding, tissue injury, clip migration, and inadequate sampling. Informed written consent was given. The usual time-out protocol was performed immediately prior to the procedure. Site 1: Right axilla lymph node: Lesion quadrant: Upper outer Using sterile technique and 1% Lidocaine as local anesthetic, under direct ultrasound visualization, a 14 gauge spring-loaded device was used to perform biopsy of an enlarged lymph node in the right axilla using a inferolateral to superomedial approach. At the conclusion of the procedure a HydroMARK spiral shaped tissue marker clip was deployed into the biopsy cavity. Follow up 2 view mammogram was performed and dictated separately. Site 2: Right breast 9 o'clock (incorrectly labeled as 10 o'clock on the images): Lesion quadrant: Upper outer Using sterile technique and 1% Lidocaine as local anesthetic, under direct  ultrasound visualization, a 14 gauge spring-loaded device was used to perform biopsy of the lymph node in the right breast at the 9 to 10 o'clock position using a lateral to medial approach. At the conclusion of the procedure a ribbon shaped tissue marker clip was deployed into the biopsy cavity. Follow up 2 view mammogram was performed and dictated separately. IMPRESSION: 1. Ultrasound-guided biopsy of a lymph node in the right axilla, at site of [facility] spiral shaped biopsy marking clip. 2. Ultrasound-guided biopsy of a lymph node in the right breast at the 9-10 o'clock position, at site of ribbon shaped biopsy marking clip. Electronically Signed: By: Everlean Alstrom M.D. On: 02/20/2022 10:25   [place] RT BREAST BX W LOC DEV EA ADD LESION IMG BX SPEC US GUIDE  Addendum Date: 02/28/2022   ADDENDUM REPORT: 02/28/2022 10:30 ADDENDUM: PATHOLOGY revealed: Site A. AXILLA, RIGHT, LYMPH NODE, NEEDLE CORE BIOPSY: - Positive for carcinoma. 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 No lymphoid tissue is identified. Clinical and radiologic correlation is suggested. This case was reviewed with Dr. Vic Ripper who agrees with the above interpretation. Pathology results are CONCORDANT with imaging findings, per Dr. Everlean Alstrom. PATHOLOGY revealed: Site B. LYMPH NODE, RIGHT BREAST, BIOPSY: - Negative for carcinoma. Pathology results are CONCORDANT with imaging findings, per Dr. Everlean Alstrom. Pathology results and recommendations below were discussed with patient by telephone on 02/28/2022. Patient reported biopsy site within normal limits with slight tenderness at the site. Post biopsy care instructions were reviewed, questions were answered and my direct phone number was provided to patient. Patient was instructed  to call [facility] Mammography Department if any concerns or questions arise related to the biopsy. RECOMMENDATION: 1. Surgical consultation followup with provider/surgeon (Dr. Curlene Labrum). Biopsy results were called to provider office and report given to [facility] Six LPN. Carmell Austria will contact patient with appointment to discuss results with provider/surgeon. Referral was  also sent to Kathi Der RT at [facility] Mammography Department, as patient requested to talk with [facility] regarding surgical and oncological referrals. 2. Patient has bilateral breast MRI scheduled for 03/02/2022 at 12 noon, and patient is aware. Pathology results reported by Electa Sniff RN on 02/27/2022. Electronically Signed   By: Everlean Alstrom M.D.   On: 02/28/2022 10:30   Addendum Date: 02/22/2022   ADDENDUM REPORT: 02/22/2022 16:38 ADDENDUM: An addendum is made to this report following a discussion with the interpreting pathologist 02/22/2022 at 4:30 p.m. Note that the specimen containers were NOT incorrectly labeled, and in fact were correct. The right axillary lymph node at biopsy site 1 was labeled correctly, with the samples placed in the container labeled 1. The right breast 9 o'clock lymph node at biopsy site 2 was labeled correctly, with the samples placed in the container labeled 2. Electronically Signed   By: Everlean Alstrom M.D.   On: 02/22/2022 16:38   Addendum Date: 02/22/2022   ADDENDUM REPORT: 02/22/2022 14:24 ADDENDUM: An addendum is made to this report following a discussion regarding the specimens with Candice from pathology. Note that the specimen containers were incorrectly labeled/switched. The right axilla lymph node (biopsy site 1 per report and images) is located within specimen container labeled 2. The right breast 9 o'clock lymph node (biopsy site 2 per report and images) is located within specimen container labeled 1. The right axillary lymph node with sent in  both saline and formalin. Electronically Signed   By: Everlean Alstrom M.D.   On: 02/22/2022 14:24   Result Date: 02/28/2022 CLINICAL DATA:  Patient presents for ultrasound-guided core biopsy of an abnormal lymph node in the right axilla as well as indeterminate lymph node in the right breast at the 9 o'clock position. EXAM: ULTRASOUND GUIDED RIGHT BREAST CORE NEEDLE BIOPSY COMPARISON:  Previous exam(s). PROCEDURE: I met with the patient and we discussed the procedure of ultrasound-guided biopsy, including benefits and alternatives. We discussed the high likelihood of a successful procedure. We discussed the risks of the procedure, including infection, bleeding, tissue injury, clip migration, and inadequate sampling. Informed written consent was given. The usual time-out protocol was performed immediately prior to the procedure. Site 1: Right axilla lymph node: Lesion quadrant: Upper outer Using sterile technique and 1% Lidocaine as local anesthetic, under direct ultrasound visualization, a 14 gauge spring-loaded device was used to perform biopsy of an enlarged lymph node in the right axilla using a inferolateral to superomedial approach. At the conclusion of the procedure a HydroMARK spiral shaped tissue marker clip was deployed into the biopsy cavity. Follow up 2 view mammogram was performed and dictated separately. Site 2: Right breast 9 o'clock (incorrectly labeled as 10 o'clock on the images): Lesion quadrant: Upper outer Using sterile technique and 1% Lidocaine as local anesthetic, under direct ultrasound visualization, a 14 gauge spring-loaded device was used to perform biopsy of the lymph node in the right breast at the 9 to 10 o'clock position using a lateral to medial approach. At the conclusion of the procedure a ribbon shaped tissue marker clip was deployed into the biopsy cavity. Follow up 2 view mammogram was performed and dictated separately. IMPRESSION: 1. Ultrasound-guided biopsy of a lymph node  in the right axilla, at site of [facility] spiral shaped biopsy marking clip. 2. Ultrasound-guided biopsy of a lymph node in the right breast at the 9-10 o'clock position, at site of ribbon shaped biopsy marking clip. Electronically Signed: By: Everlean Alstrom  M.D. On: 02/20/2022 10:25   MM CLIP PLACEMENT RIGHT  Result Date: 02/20/2022 CLINICAL DATA:  Post ultrasound-guided core biopsy an lymph node in the right axilla and ultrasound-guided core biopsy of an indeterminate lymph node in the right breast at the 9-10 o'clock position. EXAM: 3D DIAGNOSTIC RIGHT MAMMOGRAM POST ULTRASOUND BIOPSY COMPARISON:  Previous exam(s). FINDINGS: 3D Mammographic images were obtained following ultrasound-guided core biopsy an lymph node in the right axilla and ultrasound-guided core biopsy of an indeterminate lymph node in the right breast at the 9-10 o'clock position. A ribbon shaped biopsy marking clip is present at the site of the biopsied lymph node in the right breast the o'clock position. [facility] spiral shaped biopsy marking clip was not included in the field of view due to the far posterior location. IMPRESSION: 1. Ribbon shaped biopsy marking clip at site of biopsied lymph node in the right breast at the 9-10 o'clock position. 2. HydroMARK spiral shaped biopsy marking clip not included in the field of view. Final Assessment: Post Procedure Mammograms for Marker Placement Electronically Signed   By: Everlean Alstrom M.D.   On: 02/20/2022 10:27  MM DIAG BREAST TOMO BILATERAL  Result Date: 02/20/2022 CLINICAL DATA:  61 year old female recalled from screening mammography 03/01/2021 for right breast calcifications and subsequent benign discordant biopsy of these calcifications in the central posterior right breast December 2022 with excision recommended. The patient initially followed up with surgery in April however canceled her scheduled surgery and most recently followed up with Dr. [name] September 2023 with  diagnostic imaging, possible RF tag placement and subsequent excision recommended. EXAM: DIGITAL DIAGNOSTIC BILATERAL MAMMOGRAM WITH TOMOSYNTHESIS; ULTRASOUND RIGHT BREAST LIMITED TECHNIQUE: Bilateral digital diagnostic mammography and breast tomosynthesis was performed.; Targeted ultrasound examination of the right breast was performed COMPARISON:  Previous exam(s). ACR Breast Density Category b: There are scattered areas of fibroglandular density. FINDINGS: There is new skin and trabecular thickening involving the right breast. There is an asymmetry within the central right breast which spreads out on the additional imaging, and may be related to changes from prior biopsy (patient states she had massive bruising and likely hematoma following her biopsy in December) and associated trabecular thickening. Partially visualized enlarged lymph nodes are present in the right axilla. In addition, there is an oval mass in the outer right measuring 1 cm, likely an intramammary lymph node. Spot compression magnification views of the central posterior right breast were performed demonstrating benign lucent centered calcifications anteromedial and anterolateral to the X shaped biopsy marking clip. Few linear oriented calcifications are identified just posterior to the biopsy marking clip, however the previously seen extensive calcifications are not definitely identified today's exam. No suspicious masses or calcifications are seen in the left breast. Physical examination reveals generalized skin thickening and firmness involving the right breast. There is masslike enlargement of the right axilla. Targeted ultrasound of the right breast was performed. There is skin thickening and subcutaneous edema visualized throughout the right breast sonographically. A 1 cm intramammary lymph node with nodular cortical thickening is seen in the right breast at 9 o'clock 6 cm from nipple. This corresponds well with mass seen in the outer  right breast at mammography. The entire upper and lower central right breast was scanned with no definite masses or any other sonographic abnormalities identified. Targeted ultrasound of the right axilla was performed demonstrating at least 3 morphologically abnormal lymph nodes with massive cortical thickening. IMPRESSION: 1.  Suspicious right axillary lymphadenopathy. 2. Indeterminate intramammary lymph node in the  right breast at 9 o'clock. 3. 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 4. Decreased calcifications noted at prior benign biopsy site in the lower central posterior right breast at site of X shaped biopsy marking clip. RECOMMENDATION: 1. Recommend ultrasound-guided biopsy of 1 of the enlarged lymph nodes in the right axilla, with specimen sent in both saline and formalin. 2. Recommend ultrasound-guided core biopsy of the intramammary lymph node in the right breast at the 9 o'clock position. 3. If biopsy results reveal invasive mammary carcinoma, bilateral breast MRI with contrast would be recommended. I have discussed the findings and recommendations with the patient. If applicable, a reminder letter will be sent to the patient regarding the next appointment. BI-RADS CATEGORY  4: Suspicious. Electronically Signed   By: Everlean Alstrom M.D.   On: 02/20/2022 10:22  US BREAST LTD UNI RIGHT INC AXILLA  Result Date: 02/20/2022 CLINICAL DATA:  61 year old female recalled from screening mammography 03/01/2021 for right breast calcifications and subsequent benign discordant biopsy of these calcifications in the central posterior right breast December 2022 with excision recommended. The patient initially followed up with surgery in April however canceled her scheduled surgery and most recently followed up with Dr. [name] September 2023 with diagnostic  imaging, possible RF tag placement and subsequent excision recommended. EXAM: DIGITAL DIAGNOSTIC BILATERAL MAMMOGRAM WITH TOMOSYNTHESIS; ULTRASOUND RIGHT BREAST LIMITED TECHNIQUE: Bilateral digital diagnostic mammography and breast tomosynthesis was performed.; Targeted ultrasound examination of the right breast was performed COMPARISON:  Previous exam(s). ACR Breast Density Category b: There are scattered areas of fibroglandular density. FINDINGS: There is new skin and trabecular thickening involving the right breast. There is an asymmetry within the central right breast which spreads out on the additional imaging, and may be related to changes from prior biopsy (patient states she had massive bruising and likely hematoma following her biopsy in December) and associated trabecular thickening. Partially visualized enlarged lymph nodes are present in the right axilla. In addition, there is an oval mass in the outer right measuring 1 cm, likely an intramammary lymph node. Spot compression magnification views of the central posterior right breast were performed demonstrating benign lucent centered calcifications anteromedial and anterolateral to the X shaped biopsy marking clip. Few linear oriented calcifications are identified just posterior to the biopsy marking clip, however the previously seen extensive calcifications are not definitely identified today's exam. No suspicious masses or calcifications are seen in the left breast. Physical examination reveals generalized skin thickening and firmness involving the right breast. There is masslike enlargement of the right axilla. Targeted ultrasound of the right breast was performed. There is skin thickening and subcutaneous edema visualized throughout the right breast sonographically. A 1 cm intramammary lymph node with nodular cortical thickening is seen in the right breast at 9 o'clock 6 cm from nipple. This corresponds well with mass seen in the outer right breast at  mammography. The entire upper and lower central right breast was scanned with no definite masses or any other sonographic abnormalities identified. Targeted ultrasound of the right axilla was performed demonstrating at least 3 morphologically abnormal lymph nodes with massive cortical thickening. IMPRESSION: 1.  Suspicious right axillary lymphadenopathy. 2. Indeterminate intramammary lymph node in the right breast at 9 o'clock. 3. 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  4. Decreased calcifications noted at prior benign biopsy site in the lower central posterior right breast at site of X shaped biopsy marking clip. RECOMMENDATION: 1. Recommend ultrasound-guided biopsy of 1 of the enlarged lymph nodes in the right axilla, with specimen sent in both saline and formalin. 2. Recommend ultrasound-guided core biopsy of the intramammary lymph node in the right breast at the 9 o'clock position. 3. If biopsy results reveal invasive mammary carcinoma, bilateral breast MRI with contrast would be recommended. I have discussed the findings and recommendations with the patient. If applicable, a reminder letter will be sent to the patient regarding the next appointment. BI-RADS CATEGORY  4: Suspicious. Electronically Signed   By: Everlean Alstrom M.D.   On: 02/20/2022 10:22      IMPRESSION/PLAN: 1. Unknown Stage, [id number], *** carcinoma of the right breast. Dr. Lisbeth Renshaw discusses the pathology findings and reviews the nature of right node positive breast disease.*** Depending on the size of the final tumor measurements rendered by pathology, the tumor may be tested for Oncotype Dx score to determine a role for systemic therapy. Provided that chemotherapy is not indicated, the patient's course would then be followed by external radiotherapy to the breast  to reduce risks of local  recurrence followed by antiestrogen therapy. We discussed the risks, benefits, short, and long term effects of radiotherapy, as well as the curative intent, and the patient is interested in proceeding. Dr. Lisbeth Renshaw discusses the delivery and logistics of radiotherapy and anticipates a course of *** weeks of radiotherapy. We will see her back a few weeks after surgery to discuss the simulation process and anticipate we starting radiotherapy about 4-6 weeks after surgery.   2. Possible genetic predisposition to malignancy. The patient is a candidate for genetic testing given her personal  history. She will be referred to the geneticist today.   In a visit lasting *** minutes, greater than 50% of the time was spent face to face reviewing her case, as well as in preparation of, discussing, and coordinating the patient's care.  The above documentation reflects my direct findings during this shared patient visit. Please see the separate note by Dr. Lisbeth Renshaw on this date for the remainder of the patient's plan of care.    Carola Rhine, [facility]    **Disclaimer: This note was dictated with voice recognition software. Similar sounding words can inadvertently be transcribed and this note may contain transcription errors which may not have been corrected upon publication of note.**

## 2022-03-05 NOTE — Progress Notes (Signed)
Rn called patient to discuss the antibiotics. She was on them for 3 days. Says no redness or warmth now. The patient seemed to think it was reactive. Will see how things look on Wednesday and possibly do punch biopsy since no obvious mass on the right breast to demonstrate a primary cancer.

## 2022-03-06 ENCOUNTER — Other Ambulatory Visit: Payer: Self-pay

## 2022-03-06 ENCOUNTER — Ambulatory Visit
Admission: RE | Admit: 2022-03-06 | Discharge: 2022-03-06 | Disposition: A | Discharge status: Home / Self Care | Payer: No Typology Code available for payment source | Source: Ambulatory Visit | Attending: Radiation Oncology | Admitting: Radiation Oncology

## 2022-03-06 ENCOUNTER — Encounter: Payer: Self-pay | Admitting: Radiation Oncology

## 2022-03-06 ENCOUNTER — Telehealth: Payer: Self-pay | Admitting: Hematology

## 2022-03-06 VITALS — BP 134/85 | HR 83 | Temp 97.5°F | Resp 20 | Ht 62.0 in | Wt 173.4 lb

## 2022-03-06 DIAGNOSIS — C50811 Malignant neoplasm of overlapping sites of right female breast: Secondary | ICD-10-CM | POA: Diagnosis not present

## 2022-03-06 DIAGNOSIS — Z791 Long term (current) use of non-steroidal anti-inflammatories (NSAID): Secondary | ICD-10-CM | POA: Insufficient documentation

## 2022-03-06 DIAGNOSIS — C773 Secondary and unspecified malignant neoplasm of axilla and upper limb lymph nodes: Secondary | ICD-10-CM | POA: Diagnosis not present

## 2022-03-06 DIAGNOSIS — Z8673 Personal history of transient ischemic attack (TIA), and cerebral infarction without residual deficits: Secondary | ICD-10-CM | POA: Diagnosis not present

## 2022-03-06 DIAGNOSIS — E119 Type 2 diabetes mellitus without complications: Secondary | ICD-10-CM | POA: Diagnosis not present

## 2022-03-06 DIAGNOSIS — Z87891 Personal history of nicotine dependence: Secondary | ICD-10-CM | POA: Insufficient documentation

## 2022-03-06 DIAGNOSIS — I10 Essential (primary) hypertension: Secondary | ICD-10-CM | POA: Diagnosis not present

## 2022-03-06 DIAGNOSIS — Z7982 Long term (current) use of aspirin: Secondary | ICD-10-CM | POA: Insufficient documentation

## 2022-03-06 DIAGNOSIS — Z79899 Other long term (current) drug therapy: Secondary | ICD-10-CM | POA: Diagnosis not present

## 2022-03-06 DIAGNOSIS — Z803 Family history of malignant neoplasm of breast: Secondary | ICD-10-CM | POA: Insufficient documentation

## 2022-03-06 DIAGNOSIS — Z808 Family history of malignant neoplasm of other organs or systems: Secondary | ICD-10-CM | POA: Diagnosis not present

## 2022-03-06 LAB — CYTOLOGY - PAP
Adequacy: ABSENT
Comment: NEGATIVE
Diagnosis: NEGATIVE
High risk HPV: NEGATIVE

## 2022-03-06 NOTE — Telephone Encounter (Signed)
R/s pt's med onc appt per 10/31 request from Dr. Ida Rogue office. Spoke to pt who is aware of new appt date/time.

## 2022-03-07 ENCOUNTER — Ambulatory Visit (INDEPENDENT_AMBULATORY_CARE_PROVIDER_SITE_OTHER): Payer: No Typology Code available for payment source | Admitting: Family Medicine

## 2022-03-07 ENCOUNTER — Encounter: Payer: Self-pay | Admitting: Family Medicine

## 2022-03-07 ENCOUNTER — Telehealth: Payer: Self-pay | Admitting: Pharmacist

## 2022-03-07 ENCOUNTER — Ambulatory Visit: Payer: No Typology Code available for payment source | Admitting: General Surgery

## 2022-03-07 VITALS — BP 123/77 | HR 88 | Temp 98.4°F | Ht 62.0 in | Wt 172.4 lb

## 2022-03-07 DIAGNOSIS — I10 Essential (primary) hypertension: Secondary | ICD-10-CM | POA: Diagnosis not present

## 2022-03-07 DIAGNOSIS — Z23 Encounter for immunization: Secondary | ICD-10-CM

## 2022-03-07 DIAGNOSIS — E118 Type 2 diabetes mellitus with unspecified complications: Secondary | ICD-10-CM

## 2022-03-07 DIAGNOSIS — E119 Type 2 diabetes mellitus without complications: Secondary | ICD-10-CM | POA: Diagnosis not present

## 2022-03-07 DIAGNOSIS — E782 Mixed hyperlipidemia: Secondary | ICD-10-CM | POA: Diagnosis not present

## 2022-03-07 LAB — SURGICAL PATHOLOGY

## 2022-03-07 MED ORDER — TRULICITY 4.5 MG/0.5ML ~~LOC~~ SOAJ: 4.5000 mg | SUBCUTANEOUS | 3 refills | Status: DC

## 2022-03-07 MED ORDER — LORAZEPAM 1 MG PO TABS: ORAL_TABLET | ORAL | 0 refills | Status: DC

## 2022-03-07 NOTE — Telephone Encounter (Signed)
We need to increase patient's Trulicity to 4.'5mg'$  weekly I have escribed to [place] Can you help with additional paperwork if needed? Thank you!

## 2022-03-07 NOTE — Progress Notes (Signed)
Subjective:  Patient ID: Marilyn Ford,  female    DOB: 1960/08/07  Age: 61 y.o.    CC: Medical Management of Chronic Issues   HPI Marilyn Ford presents for  follow-up of hypertension. Patient has no history of headache chest pain or shortness of breath or recent cough. Patient also denies symptoms of TIA such as numbness weakness lateralizing. Patient denies side effects from medication. States taking it regularly.  Patient also  in for follow-up of elevated cholesterol. Doing well without complaints on current medication. Denies side effects  including myalgia and arthralgia and nausea. Also in today for liver function testing. Currently no chest pain, shortness of breath or other cardiovascular related symptoms noted.  Follow-up of diabetes. Patient denies symptoms such as excessive hunger or urinary frequency, excessive hunger, nausea No significant hypoglycemic spells noted. Medications reviewed. Pt reports taking them regularly. Pt. denies complication/adverse reaction today.      History Tracy has a past medical history of Breast cancer ([place]), Diabetes mellitus without complication ([place]), Hypertension, Stroke ([place]), and Vaginal Pap smear, abnormal.   She has a past surgical history that includes Cholecystectomy; Cesarean section; Colonoscopy with propofol (N/A, 07/19/2020); and polypectomy (07/19/2020).   Her family history includes Breast cancer in her maternal aunt; Cervical cancer in her mother; Diabetes in her brother and mother.She reports that she quit smoking about 6 years ago. Her smoking use included cigarettes. She started smoking about 22 years ago. She smoked an average of .5 packs per day. She has never used smokeless tobacco. She reports current alcohol use of about 1.0 standard drink of alcohol per week. She reports that she does not use drugs.  Current Outpatient Medications on File Prior to Visit  Medication Sig Dispense Refill   Alcohol Swabs (B-D SINGLE  USE SWABS REGULAR) PADS Test BS daily and as needed Dx E11.9 100 each 3   amLODipine (NORVASC) 10 MG tablet 1 tablet daily 90 tablet 3   Apple Cider Vinegar 500 MG TABS Take 500 mg by mouth daily.     aspirin 81 MG EC tablet Take 1 tablet (81 mg total) by mouth daily. 30 tablet 0   atorvastatin (LIPITOR) 80 MG tablet TAKE 1 TABLET EVERY DAY AT 6PM 90 tablet 3   Blood Glucose Calibration (TRUE METRIX LEVEL 1) Low SOLN Use with glucometer Dx E11.9 3 each 0   Blood Glucose Monitoring Suppl (TRUE METRIX AIR GLUCOSE METER) w/Device KIT Test BS daily and as needed Dx E11.9 1 kit 0   Calcium Carb-Cholecalciferol (CALCIUM-VITAMIN D) 600-400 MG-UNIT TABS Take 1 tablet by mouth daily.     Coenzyme Q10 (COQ10) 100 MG CAPS Take 100 mg by mouth daily.     Cranberry 425 MG CAPS Take 425 mg by mouth in the morning and at bedtime.     Flaxseed, Linseed, (FLAXSEED OIL) 1000 MG CAPS Take 1,000 mg by mouth in the morning and at bedtime.     glucose blood (TRUE METRIX BLOOD GLUCOSE TEST) test strip Test BS daily and as needed Dx E11.9 100 each 3   Krill Oil 500 MG CAPS Take 3 capsules (1,500 mg total) by mouth in the morning and at bedtime.     meloxicam (MOBIC) 15 MG tablet Take 1 tablet (15 mg total) by mouth daily. For joint and muscle pain 30 tablet 5   metoprolol (TOPROL-XL) 200 MG 24 hr tablet TAKE 1 TABLET ONE TIME DAILY, WITH OR IMMEDIATELY FOLLOWING A MEAL 90 tablet 3  Multiple Vitamin (MULTIVITAMIN) capsule Take 1 capsule by mouth daily.     pantoprazole (PROTONIX) 40 MG tablet TAKE 1 TABLET twice daily FOR STOMACH 180 tablet 3   traZODone (DESYREL) 150 MG tablet TAKE 1 OR 2 TABLETS AT BEDTIME FOR SLEEP 180 tablet 3   TRUEplus Lancets 33G MISC Test BS daily and as needed Dx E11.9 100 each 3   valsartan (DIOVAN) 320 MG tablet TAKE 1 TABLET (320 MG TOTAL) BY MOUTH DAILY. FOR BLOOD PRESSURE. 90 tablet 1   zinc gluconate 50 MG tablet Take 50 mg by mouth daily.     No current facility-administered  medications on file prior to visit.    ROS Review of Systems  Constitutional: Negative.   HENT: Negative.    Eyes:  Negative for visual disturbance.  Respiratory:  Negative for shortness of breath.   Cardiovascular:  Negative for chest pain.  Gastrointestinal:  Negative for abdominal pain.  Musculoskeletal:  Negative for arthralgias.    Objective:  BP 123/77   Pulse 88   Temp 98.4 F (36.9 C)   Ht _0  (1.575 m)   Wt 172 lb 6.4 oz (78.2 kg)   SpO2 94%   BMI 31.53 kg/m   BP Readings from Last 3 Encounters:  03/07/22 123/77  03/06/22 134/85  03/01/22 127/79    Wt Readings from Last 3 Encounters:  03/07/22 172 lb 6.4 oz (78.2 kg)  03/06/22 173 lb 6.4 oz (78.7 kg)  03/01/22 175 lb (79.4 kg)     Physical Exam Constitutional:      General: She is not in acute distress.    Appearance: She is well-developed.  Cardiovascular:     Rate and Rhythm: Normal rate and regular rhythm.  Pulmonary:     Breath sounds: Normal breath sounds.  Musculoskeletal:        General: Normal range of motion.  Skin:    General: Skin is warm and dry.  Neurological:     Mental Status: She is alert and oriented to person, place, and time.     Diabetic Foot Exam - Simple   No data filed     Lab Results  Component Value Date   HGBA1C 6.1 (H) 12/05/2021   HGBA1C 6.7 (H) 08/22/2021   HGBA1C 6.7 (H) 05/25/2021    Assessment & Plan:   Bri was seen today for medical management of chronic issues.  Diagnoses and all orders for this visit:  Primary hypertension -     CBC with Differential/Platelet -     CMP14+EGFR  Type 2 diabetes mellitus without complication, without long-term current use of insulin (HCC) -     Bayer DCA Hb A1c Waived -     Microalbumin / creatinine urine ratio  Mixed hyperlipidemia -     Lipid panel  Need for immunization against influenza -     Flu Vaccine QUAD 57moIM (Fluarix, Fluzone & Alfiuria Quad PF)  Other orders -     Discontinue: Dulaglutide  (TRULICITY) 4.5 MML/4.6TKSOPN; Inject 4.5 mg as directed once a week. -     LORazepam (ATIVAN) 1 MG tablet; Take one, one hour before your MRI   I have discontinued DJadwigaT. Mayo's clopidogrel and Trulicity. I have also changed her LORazepam. Additionally, I am having her maintain her multivitamin, aspirin EC, Flaxseed Oil, Apple Cider Vinegar, Calcium-Vitamin D, zinc gluconate, CoQ10, Cranberry, Krill Oil, True Metrix Air Glucose Meter, True Metrix Blood Glucose Test, TRUEplus Lancets 33G, True Metrix Level 1, B-D SINGLE  USE SWABS REGULAR, amLODipine, atorvastatin, metoprolol, traZODone, pantoprazole, valsartan, and meloxicam.  Meds ordered this encounter  Medications   DISCONTD: Dulaglutide (TRULICITY) 4.5 NP/5.9AW SOPN    Sig: Inject 4.5 mg as directed once a week.    Dispense:  6 mL    Refill:  3   LORazepam (ATIVAN) 1 MG tablet    Sig: Take one, one hour before your MRI    Dispense:  1 tablet    Refill:  0     Follow-up: Return in about 3 months (around 06/07/2022).  Claretta Fraise, M.D.

## 2022-03-08 ENCOUNTER — Other Ambulatory Visit: Payer: Self-pay | Admitting: Hematology

## 2022-03-08 ENCOUNTER — Ambulatory Visit: Payer: No Typology Code available for payment source | Admitting: Hematology

## 2022-03-08 ENCOUNTER — Inpatient Hospital Stay: Payer: No Typology Code available for payment source | Attending: Hematology and Oncology | Admitting: Hematology

## 2022-03-08 ENCOUNTER — Other Ambulatory Visit: Payer: Self-pay

## 2022-03-08 ENCOUNTER — Encounter: Payer: Self-pay | Admitting: Hematology

## 2022-03-08 VITALS — BP 145/81 | HR 94 | Temp 98.0°F | Resp 18 | Ht 62.0 in | Wt 173.7 lb

## 2022-03-08 DIAGNOSIS — Z5111 Encounter for antineoplastic chemotherapy: Secondary | ICD-10-CM | POA: Diagnosis not present

## 2022-03-08 DIAGNOSIS — Z808 Family history of malignant neoplasm of other organs or systems: Secondary | ICD-10-CM | POA: Diagnosis not present

## 2022-03-08 DIAGNOSIS — C773 Secondary and unspecified malignant neoplasm of axilla and upper limb lymph nodes: Secondary | ICD-10-CM | POA: Diagnosis not present

## 2022-03-08 DIAGNOSIS — R9389 Abnormal findings on diagnostic imaging of other specified body structures: Secondary | ICD-10-CM

## 2022-03-08 DIAGNOSIS — Z79899 Other long term (current) drug therapy: Secondary | ICD-10-CM | POA: Insufficient documentation

## 2022-03-08 DIAGNOSIS — Z87891 Personal history of nicotine dependence: Secondary | ICD-10-CM

## 2022-03-08 DIAGNOSIS — Z803 Family history of malignant neoplasm of breast: Secondary | ICD-10-CM | POA: Diagnosis not present

## 2022-03-08 DIAGNOSIS — Z171 Estrogen receptor negative status [ER-]: Secondary | ICD-10-CM

## 2022-03-08 DIAGNOSIS — Z5112 Encounter for antineoplastic immunotherapy: Secondary | ICD-10-CM | POA: Insufficient documentation

## 2022-03-08 DIAGNOSIS — C50811 Malignant neoplasm of overlapping sites of right female breast: Secondary | ICD-10-CM | POA: Diagnosis not present

## 2022-03-08 LAB — CMP14+EGFR
ALT: 21 IU/L (ref 0–32)
AST: 17 IU/L (ref 0–40)
Albumin/Globulin Ratio: 1.3 (ref 1.2–2.2)
Albumin: 4.3 g/dL (ref 3.9–4.9)
Alkaline Phosphatase: 153 IU/L — ABNORMAL HIGH (ref 44–121)
BUN/Creatinine Ratio: 14 (ref 12–28)
BUN: 14 mg/dL (ref 8–27)
Bilirubin Total: 0.4 mg/dL (ref 0.0–1.2)
CO2: 24 mmol/L (ref 20–29)
Calcium: 10.1 mg/dL (ref 8.7–10.3)
Chloride: 100 mmol/L (ref 96–106)
Creatinine, Ser: 1 mg/dL (ref 0.57–1.00)
Globulin, Total: 3.3 g/dL (ref 1.5–4.5)
Glucose: 222 mg/dL — ABNORMAL HIGH (ref 70–99)
Potassium: 4.6 mmol/L (ref 3.5–5.2)
Sodium: 141 mmol/L (ref 134–144)
Total Protein: 7.6 g/dL (ref 6.0–8.5)
eGFR: 64 mL/min/{1.73_m2} (ref >59)

## 2022-03-08 LAB — CBC WITH DIFFERENTIAL/PLATELET
Basophils Absolute: 0.1 10*3/uL (ref 0.0–0.2)
Basos: 1 %
EOS (ABSOLUTE): 0.2 10*3/uL (ref 0.0–0.4)
Eos: 2 %
Hematocrit: 40.3 % (ref 34.0–46.6)
Hemoglobin: 14.1 g/dL (ref 11.1–15.9)
Immature Grans (Abs): 0 10*3/uL (ref 0.0–0.1)
Immature Granulocytes: 0 %
Lymphocytes Absolute: 1.4 10*3/uL (ref 0.7–3.1)
Lymphs: 14 %
MCH: 31.7 pg (ref 26.6–33.0)
MCHC: 35 g/dL (ref 31.5–35.7)
MCV: 91 fL (ref 79–97)
Monocytes Absolute: 0.5 10*3/uL (ref 0.1–0.9)
Monocytes: 5 %
Neutrophils Absolute: 7.5 10*3/uL — ABNORMAL HIGH (ref 1.4–7.0)
Neutrophils: 78 %
Platelets: 336 10*3/uL (ref 150–450)
RBC: 4.45 x10E6/uL (ref 3.77–5.28)
RDW: 12.6 % (ref 11.7–15.4)
WBC: 9.6 10*3/uL (ref 3.4–10.8)

## 2022-03-08 LAB — LIPID PANEL
Chol/HDL Ratio: 4 ratio (ref 0.0–4.4)
Cholesterol, Total: 145 mg/dL (ref 100–199)
HDL: 36 mg/dL — ABNORMAL LOW (ref >39)
LDL Chol Calc (NIH): 61 mg/dL (ref 0–99)
Triglycerides: 308 mg/dL — ABNORMAL HIGH (ref 0–149)
VLDL Cholesterol Cal: 48 mg/dL — ABNORMAL HIGH (ref 5–40)

## 2022-03-08 LAB — BAYER DCA HB A1C WAIVED: HB A1C (BAYER DCA - WAIVED): 8.1 % — ABNORMAL HIGH (ref 4.8–5.6)

## 2022-03-08 LAB — MICROALBUMIN / CREATININE URINE RATIO
Creatinine, Urine: 151.7 mg/dL
Microalb/Creat Ratio: 17 mg/g creat (ref 0–29)
Microalbumin, Urine: 26.1 ug/mL

## 2022-03-08 NOTE — Progress Notes (Signed)
[place]   Telephone:(336) [phone] Fax:([phone]) [place] Note   Patient Care Team: Claretta Fraise, MD as PCP - General (Family Medicine) Claretta Fraise, MD (Family Medicine) Lavera Guise, [facility] as Pharmacist (Family Medicine) Montez Morita, Quillian Quince, MD as Consulting Physician (Gastroenterology) Aviva Signs, MD as Consulting Physician (General Surgery)  Date of Service:  03/08/2022   CHIEF COMPLAINTS/PURPOSE OF CONSULTATION:  Right Breast Cancer, ER-  REFERRING PHYSICIAN:  [facility]   ASSESSMENT & PLAN:  Marilyn Ford is a 61 y.o. postmenopausal female with a history of stroke in 2016  1. 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 Bilateral mammogram and right [place] on 02/20/22 showed: suspicious right axillary lymphadenopathy; new right breast skin and trabecular thickening, concerning for inflammatory breast cancer. Biopsy same day of a right axillary lymph node confirmed grade 3 carcinoma with metaplastic feature.  -breast MRI 03/02/22 showed: suspicious 3.8 cm area of enhancement in UOQ right breast; diffuse skin thickening; numerous bulky matted lymph nodes in right axilla; left breast negative. 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 -we reviewed the results of recent MRI and prognostic panel today. We discussed the aggressive nature of triple negative breast cancers. I recommend obtaining biopsy of the suspicious area seen on MRI to determine the type of breast cancer, as well as a staging PET scan to rule out any distant metastatic disease. I have ordered both today to be done soon. -Discussed the aggressive nature of triple negative breast cancer, high risk of recurrence,  and the benefit of adjuvant chemotherapy, in the neoadjuvant setting or adjuvant setting.   -I manage her adjuvant chemotherapy with weekly carboplatin/taxol for 12 weeks, followed by 4 cycles of adriamycin and cytoxan every 3 weeks. We also give immunotherapy with Keytruda along side chemotherapy, given every 3 weeks for a total of one year, based on the KEYNOTE 522 trial data.  --Chemotherapy consent: Side effects including but does not not limited to, fatigue, nausea, vomiting, diarrhea, hair loss, neuropathy, fluid retention, renal and kidney dysfunction, neutropenic fever, needed for blood transfusion, bleeding, were discussed with patient in great detail. She agrees to proceed. -I briefly discussed the likelihood that this is a metaplastic cancer. If this is the case, she may not respond well to chemotherapy. I explained that I plan to obtain repeat MRI after she complete chemo or early if she has no clinical response to chemo  -given her ER/PR negativity, she will likely not benefit from antiestrogen therapy. -She has seen radiation oncologist Dr. Lisbeth Renshaw, and plan to have adjuvant radiation. -Was previous seen by general surgeon Dr. Arnoldo Morale, but he would like to see a breast surgeon. I will refer her.   2. Family history -she reports breast cancer in a maternal aunt, as well as cervical cancer in her mother.  -I recommend genetic testing to rule out BRCA.   PLAN:  -I ordered MRI guided right breast biopsy -staging PET scan ASAP  -referral to genetics -referral to breast surgeon -port placement  -tumor board discussion next week -plant to start neoadjuvant chemotherapy in 1 to 2 weeks.   Oncology History Overview Note   Cancer Staging  Malignant neoplasm of overlapping sites of right female breast [facility]) Staging form: Breast, AJCC  8th Edition - Clinical stage from 03/08/2022: Stage IIIC (cT2, cN2, cM0, G3, ER-, PR-, HER2-) - Signed by Truitt Merle, MD on 03/08/2022    Malignant neoplasm  of overlapping sites of right female breast [facility])  02/20/2022 Mammogram   CLINICAL DATA:  61 year old female recalled from screening mammography 03/01/2021 for right breast calcifications and subsequent benign discordant biopsy of these calcifications in the central posterior right breast December 2022 with excision recommended. The patient initially followed up with surgery in April however canceled her scheduled surgery and most recently followed up with Dr. [name] September 2023 with diagnostic imaging, possible RF tag placement and subsequent excision recommended.   EXAM: DIGITAL DIAGNOSTIC BILATERAL MAMMOGRAM WITH TOMOSYNTHESIS; ULTRASOUND RIGHT BREAST LIMITED  MPRESSION: 1.  Suspicious right axillary lymphadenopathy.   2. Indeterminate intramammary lymph node in the right breast at 9 o'clock.   3. 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   4. Decreased calcifications noted at prior benign biopsy site in the lower central posterior right breast at site of X shaped biopsy marking clip.   02/20/2022 Initial Biopsy   FINAL MICROSCOPIC DIAGNOSIS:   A. AXILLA, RIGHT, LYMPH NODE, NEEDLE CORE BIOPSY:  - Positive for carcinoma (see Comment)   B. LYMPH NODE, RIGHT BREAST, BIOPSY:  - Negative for carcinoma   COMMENT:  Part A: Morphology and immunohistochemical staining are most compatible with primary breast carcinoma with metaplastic changes, however differential diagnosis also includes urothelial carcinoma and less likely primary lung carcinoma (squamous).  No lymphoid tissue is identified.  Clinical and radiologic correlation is suggested.   ADDENDUM:  In case of a breast origin, the appropriate grade would be grade 3  (3+3+2)   ADDENDUM:  PROGNOSTIC INDICATOR RESULTS:  The tumor cells are EQUIVOCAL for Her2 (2+).  Estrogen  Receptor:       0%, NEGATIVE  Progesterone Receptor:   0%, NEGATIVE  Proliferation Marker Ki-67:   60%   ADDENDUM:  FLOURESCENCE IN-SITU HYBRIDIZATION RESULTS:  GROUP 5:   HER2 **NEGATIVE**    03/02/2022 Imaging   EXAM: BILATERAL BREAST MRI WITH AND WITHOUT CONTRAST  IMPRESSION: 1. There is a suspicious 3.8 cm area of patchy non mass enhancement in a linear orientation in the slightly upper outer right breast in the mid to posterior depth spanning 3.8 cm.   2. Diffuse skin thickening with enhancement of the skin, concerning for inflammatory breast cancer.   3. Numerous bulky matted lymph nodes in the right axilla, one of which corresponds with the biopsy-proven metastatic lymph node.   4.  No evidence of left breast malignancy.   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 ([place])   03/08/2022 Cancer Staging   Staging form: Breast, AJCC 8th Edition - Clinical stage from 03/08/2022: Stage IIIC (cT2, cN2, cM0, G3, ER-, PR-, HER2-) - Signed by Truitt Merle, MD on 03/08/2022 Histologic grading system: 3 grade system      HISTORY OF PRESENTING ILLNESS:  Marilyn Ford 61 y.o. female is a here because of breast cancer. The patient was referred by [facility]. The patient presents to the clinic today accompanied by her daughter.   She has history of benign right breast calcifications in the right breast LOQ, biopsied 04/06/21. This was felt to be discordant, and she was referred to Dr. Arnoldo Morale in 08/2021. Excision was recommended, but she  cancelled the procedure to care for her husband.  She met with Dr. [name] in 01/2022 for follow up of the breast issues, and at that time she reported a palpable right node. She underwent repeat mammogram with right breast US on 02/20/22 showing: suspicious right axillary lymphadenopathy; indeterminate intramammary lymph node in right breast at 9 o'clock; new right breast skin and trabecular thickening,  most concerning for inflammatory breast cancer; decreased calcifications at prior benign biopsy site in lower central right breast.  Biopsy of a right axillary lymph node on 02/20/22 was positive for carcinoma. Prognostic indicators significant for: estrogen receptor, 0% negative and progesterone receptor, 0% negative. Proliferation marker Ki67 at 60%. HER2 negative by FISH. Biopsy of the right intramammary lymph node was negative.   Today the patient notes they felt/feeling prior/after... -she reports breast tenderness and redness s/p biopsy  She has a PMHx of.... -h/o stroke in 03/2015, some residual right leg weakness (ambulates without assistance)  Socially... -she works part time at a [employer] -she lives with her husband of 52 years. -she reports breast cancer in a maternal aunt, as well as cervical cancer in her mother. -former smoker, 0.5 ppd for ~25 years, quit with stroke in 2016   GYN HISTORY  Menarchal: 61 years old LMP: 2016, with stroke Contraceptive: HRT:  GP: 2, first at age 6   REVIEW OF SYSTEMS:    Constitutional: Denies fevers, chills or abnormal night sweats Eyes: Denies blurriness of vision, double vision or watery eyes Ears, nose, mouth, throat, and face: Denies mucositis or sore throat Respiratory: Denies cough, dyspnea or wheezes Cardiovascular: Denies palpitation, chest discomfort or lower extremity swelling Gastrointestinal:  Denies nausea, heartburn or change in bowel habits Skin: Denies abnormal skin rashes Lymphatics: Denies easy bruising, (+) palpable right axillary lymphadenopathy Neurological:Denies numbness, tingling or new weaknesses, (+) mild residual right leg weakness from stroke Behavioral/Psych: Mood is stable, no new changes  All other systems were reviewed with the patient and are negative.   MEDICAL HISTORY:  Past Medical History:  Diagnosis Date   Allergy    Breast cancer ([place])    Diabetes mellitus without complication ([place])     Hypertension    Stroke ([place])    Vaginal Pap smear, abnormal     SURGICAL HISTORY: Past Surgical History:  Procedure Laterality Date   CESAREAN SECTION     CHOLECYSTECTOMY     COLONOSCOPY WITH PROPOFOL N/A 07/19/2020   Procedure: COLONOSCOPY WITH PROPOFOL;  Surgeon: Harvel Quale, MD;  [facility];  Service: Gastroenterology;  Laterality: N/A;  AM   POLYPECTOMY  07/19/2020   Procedure: POLYPECTOMY;  Surgeon: Montez Morita, Quillian Quince, MD;  Location: [facility];  Service: Gastroenterology;;    SOCIAL HISTORY: Social History   Socioeconomic History   Marital status: Married    Spouse name: Jeneen Rinks   Number of children: 2   Years of education: 10   Highest education level: 10th grade  Occupational History   Occupation: disabled  Tobacco Use   Smoking status: Former    Packs/day: 0.50    Years: 25.00    Total pack years: 12.50    Types: Cigarettes    Start date: 12/08/1999    Quit date: 03/12/2015    Years since quitting: 6.9   Smokeless tobacco: Never  Vaping Use   Vaping Use: Never used  Substance and Sexual Activity   Alcohol use: Yes    Alcohol/week: 1.0 standard drink of alcohol    Types: 1  Glasses of wine per week    Comment: not regular drinker   Drug use: No   Sexual activity: Not Currently    Birth control/protection: Abstinence, Post-menopausal  Other Topics Concern   Not on file  Social History Narrative   Volunteers at [employer] for a few hours every day.    She really enjoys getting out of of the house and working there.    Social Determinants of Health   Financial Resource Strain: Low Risk  (03/01/2022)   Overall Financial Resource Strain (CARDIA)    Difficulty of Paying Living Expenses: Not very hard  Food Insecurity: No Food Insecurity (03/01/2022)   Hunger Vital Sign    Worried About Running Out of Food in the Last Year: Never true    Ran Out of Food in the Last Year: Never true  Transportation Needs: No  Transportation Needs (03/01/2022)   PRAPARE - Hydrologist (Medical): No    Lack of Transportation (Non-Medical): No  Physical Activity: Insufficiently Active (03/01/2022)   Exercise Vital Sign    Days of Exercise per Week: 1 day    Minutes of Exercise per Session: 10 min  Stress: Stress Concern Present (03/01/2022)   [place]    Feeling of Stress : Rather much  Social Connections: Moderately Isolated (03/01/2022)   Social Connection and Isolation Panel [NHANES]    Frequency of Communication with Friends and Family: Twice a week    Frequency of Social Gatherings with Friends and Family: Twice a week    Attends Religious Services: Never    Marine scientist or Organizations: No    Attends Archivist Meetings: Never    Marital Status: Married  Human resources officer Violence: Not At Risk (03/01/2022)   Humiliation, Afraid, Rape, and Kick questionnaire    Fear of Current or Ex-Partner: No    Emotionally Abused: No    Physically Abused: No    Sexually Abused: No    FAMILY HISTORY: Family History  Problem Relation Age of Onset   Diabetes Mother    Cervical cancer Mother    Diabetes Brother    Breast cancer Maternal Aunt     ALLERGIES:  is allergic to penicillins and sulfa antibiotics.  MEDICATIONS:  Current Outpatient Medications  Medication Sig Dispense Refill   Alcohol Swabs (B-D SINGLE USE SWABS REGULAR) PADS Test BS daily and as needed Dx E11.9 100 each 3   amLODipine (NORVASC) 10 MG tablet 1 tablet daily 90 tablet 3   Apple Cider Vinegar 500 MG TABS Take 500 mg by mouth daily.     aspirin 81 MG EC tablet Take 1 tablet (81 mg total) by mouth daily. 30 tablet 0   atorvastatin (LIPITOR) 80 MG tablet TAKE 1 TABLET EVERY DAY AT 6PM 90 tablet 3   Blood Glucose Calibration (TRUE METRIX LEVEL 1) Low SOLN Use with glucometer Dx E11.9 3 each 0   Blood Glucose Monitoring Suppl  (TRUE METRIX AIR GLUCOSE METER) w/Device KIT Test BS daily and as needed Dx E11.9 1 kit 0   Calcium Carb-Cholecalciferol (CALCIUM-VITAMIN D) 600-400 MG-UNIT TABS Take 1 tablet by mouth daily.     Coenzyme Q10 (COQ10) 100 MG CAPS Take 100 mg by mouth daily.     Cranberry 425 MG CAPS Take 425 mg by mouth in the morning and at bedtime.     Dulaglutide (TRULICITY) 4.5 VK/1.8MC SOPN Inject 4.5 mg as directed  once a week. 6 mL 3   Flaxseed, Linseed, (FLAXSEED OIL) 1000 MG CAPS Take 1,000 mg by mouth in the morning and at bedtime.     glucose blood (TRUE METRIX BLOOD GLUCOSE TEST) test strip Test BS daily and as needed Dx E11.9 100 each 3   Krill Oil 500 MG CAPS Take 3 capsules (1,500 mg total) by mouth in the morning and at bedtime.     LORazepam (ATIVAN) 1 MG tablet Take one, one hour before your MRI 1 tablet 0   meloxicam (MOBIC) 15 MG tablet Take 1 tablet (15 mg total) by mouth daily. For joint and muscle pain 30 tablet 5   metoprolol (TOPROL-XL) 200 MG 24 hr tablet TAKE 1 TABLET ONE TIME DAILY, WITH OR IMMEDIATELY FOLLOWING A MEAL 90 tablet 3   Multiple Vitamin (MULTIVITAMIN) capsule Take 1 capsule by mouth daily.     pantoprazole (PROTONIX) 40 MG tablet TAKE 1 TABLET twice daily FOR STOMACH 180 tablet 3   traZODone (DESYREL) 150 MG tablet TAKE 1 OR 2 TABLETS AT BEDTIME FOR SLEEP 180 tablet 3   TRUEplus Lancets 33G MISC Test BS daily and as needed Dx E11.9 100 each 3   valsartan (DIOVAN) 320 MG tablet TAKE 1 TABLET (320 MG TOTAL) BY MOUTH DAILY. FOR BLOOD PRESSURE. 90 tablet 1   zinc gluconate 50 MG tablet Take 50 mg by mouth daily.     No current facility-administered medications for this visit.    PHYSICAL EXAMINATION: ECOG PERFORMANCE STATUS: 0 - Asymptomatic  Vitals:   03/08/22 1503  BP: (!) 145/81  Pulse: 94  Resp: 18  Temp: 98 F (36.7 C)  SpO2: 98%   Filed Weights   03/08/22 1503  Weight: 173 lb 11.2 oz (78.8 kg)    GENERAL:alert, no distress and comfortable SKIN: skin  color, texture, turgor are normal, no rashes or significant lesions EYES: normal, Conjunctiva are pink and non-injected, sclera clear  NECK: supple, thyroid normal size, non-tender, without nodularity LYMPH:  no palpable lymphadenopathy in the cervical, (+) 4.5 x 5 cm palpable, partially mobile right axillary node  LUNGS: clear to auscultation and percussion with normal breathing effort HEART: regular rate & rhythm and no murmurs and no lower extremity edema ABDOMEN:abdomen soft, non-tender and normal bowel sounds Musculoskeletal:no cyanosis of digits and no clubbing  NEURO: alert & oriented x 3 with fluent speech, no focal motor/sensory deficits BREAST:  (+) diffuse skin erythema over ~2/3 of right breast, (+) tender to palpation. No palpable mass or nodules bilaterally.   LABORATORY DATA:  I have reviewed the data as listed    Latest Ref Rng & Units 03/07/2022    9:42 AM 12/05/2021    8:49 AM 09/04/2021    2:12 PM  CBC  WBC 3.4 - 10.8 x10E3/uL 9.6  8.0  10.9   Hemoglobin 11.1 - 15.9 g/dL 14.1  13.9  15.1   Hematocrit 34.0 - 46.6 % 40.3  40.9  43.8   Platelets 150 - 450 x10E3/uL 336  237  276        Latest Ref Rng & Units 03/07/2022    9:42 AM 12/05/2021    8:49 AM 09/04/2021    2:12 PM  CMP  Glucose 70 - 99 mg/dL 222  162  136   BUN 8 - 27 mg/dL _0 Creatinine 0.57 - 1.00 mg/dL 1.00  1.03  0.91   Sodium 134 - 144 mmol/L 141  140  139  Potassium 3.5 - 5.2 mmol/L 4.6  4.3  3.7   Chloride 96 - 106 mmol/L 100  98  101   CO2 20 - 29 mmol/L _0 Calcium 8.7 - 10.3 mg/dL 10.1  9.5  10.5   Total Protein 6.0 - 8.5 g/dL 7.6  7.0    Total Bilirubin 0.0 - 1.2 mg/dL 0.4  0.4    Alkaline Phos 44 - 121 IU/L 153  119    AST 0 - 40 IU/L 17  23    ALT 0 - 32 IU/L 21  26       RADIOGRAPHIC STUDIES: I have personally reviewed the radiological images as listed and agreed with the findings in the report. MR Breast Bilateral W Wo Contrast  Result Date: 03/05/2022 CLINICAL DATA:   61 year old female with recent biopsy abnormal right axillary lymph nodes. Pathology indicated metastatic lymph nodes with uncertain primary. She also had a lymph node biopsied in the right breast at 9 o'clock (ribbon clip) which was benign and concordant. The patient has not had any diagnosed cancer within the right breast. She did have a stereotactic biopsy of right breast calcifications in December of 2022 (X clip) which were benign but discordant and surgical excision was recommended. Surgery has not been performed. The patient was recently placed on antibiotics for possible right breast mastitis (redness and swelling of the right breast). EXAM: BILATERAL BREAST MRI WITH AND WITHOUT CONTRAST TECHNIQUE: Multiplanar, multisequence MR images of both breasts were obtained prior to and following the intravenous administration of 8 ml of Gadavist Three-dimensional MR images were rendered by post-processing of the original MR data on an independent workstation. The three-dimensional MR images were interpreted, and findings are reported in the following complete MRI report for this study. Three dimensional images were evaluated at the independent interpreting workstation using the DynaCAD thin client. COMPARISON:  No prior MRI available for comparison. Correlation made with prior mammogram and ultrasound images. FINDINGS: Breast composition: b. Scattered fibroglandular tissue. Background parenchymal enhancement: Minimal Right breast: In the slightly upper-outer right breast, mid to posterior depth there is patchy clumped non mass enhancement in a linear orientation spanning approximately 3.8 cm (series 15, images 26-37). Susceptibility artifact from the biopsy marking clip is seen just below the level of this enhancement (series 2, image 30). There is diffuse thickening of the skin in the right breast with skin enhancement. Left breast: No mass or abnormal enhancement. Lymph nodes: There are numerous bulky matted lymph  nodes in the right axilla. Susceptibility artifact within one of these lymph nodes corresponds with the biopsy marking clip from her recent biopsy. Ancillary findings:  None. IMPRESSION: 1. There is a suspicious 3.8 cm area of patchy non mass enhancement in a linear orientation in the slightly upper outer right breast in the mid to posterior depth spanning 3.8 cm. 2. Diffuse skin thickening with enhancement of the skin, concerning for inflammatory breast cancer. 3. Numerous bulky matted lymph nodes in the right axilla, one of which corresponds with the biopsy-proven metastatic lymph node. 4.  No evidence of left breast malignancy. RECOMMENDATION: 1. MRI guided biopsy is recommended for the anterior and posterior aspect of the patchy non mass enhancement in the upper-outer right breast. 2. Consider skin punch biopsy to evaluate for inflammatory breast cancer given the skin thickening and enhancement if the patient's symptoms do not entirely resolve with antibiotics. BI-RADS CATEGORY  4: Suspicious. Electronically Signed   By: Ammie Ferrier M.D.  On: 03/05/2022 06:57  [place] RT BREAST BX W LOC DEV 1ST LESION IMG BX SPEC US GUIDE  Addendum Date: 02/28/2022   ADDENDUM REPORT: 02/28/2022 10:30 ADDENDUM: PATHOLOGY revealed: Site A. AXILLA, RIGHT, LYMPH NODE, NEEDLE CORE BIOPSY: - Positive for carcinoma. 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 No lymphoid tissue is identified. Clinical and radiologic correlation is suggested. This case was reviewed with Dr. Vic Ripper who agrees with the above interpretation. Pathology results are CONCORDANT with imaging findings, per Dr. Everlean Alstrom. PATHOLOGY revealed: Site B. LYMPH NODE, RIGHT BREAST, BIOPSY: - Negative for carcinoma. Pathology results are CONCORDANT  with imaging findings, per Dr. Everlean Alstrom. Pathology results and recommendations below were discussed with patient by telephone on 02/28/2022. Patient reported biopsy site within normal limits with slight tenderness at the site. Post biopsy care instructions were reviewed, questions were answered and my direct phone number was provided to patient. Patient was instructed to call [facility] Mammography Department if any concerns or questions arise related to the biopsy. RECOMMENDATION: 1. Surgical consultation followup with provider/surgeon (Dr. Curlene Labrum). Biopsy results were called to provider office and report given to [facility] LPN. Carmell Austria will contact patient with appointment to discuss results with provider/surgeon. Referral was also sent to Kathi Der RT at [facility] Mammography Department, as patient requested to talk with [facility] regarding surgical and oncological referrals. 2. Patient has bilateral breast MRI scheduled for 03/02/2022 at 12 noon, and patient is aware. Pathology results reported by Electa Sniff RN on 02/27/2022. Electronically Signed   By: Everlean Alstrom M.D.   On: 02/28/2022 10:30   Addendum Date: 02/22/2022   ADDENDUM REPORT: 02/22/2022 16:38 ADDENDUM: An addendum is made to this report following a discussion with the interpreting pathologist 02/22/2022 at 4:30 p.m. Note that the specimen containers were NOT incorrectly labeled, and in fact were correct. The right axillary lymph node at biopsy site 1 was labeled correctly, with the samples placed in the container labeled 1. The right breast 9 o'clock lymph node at biopsy site 2 was labeled correctly, with the samples placed in the container labeled 2. Electronically Signed   By: Everlean Alstrom M.D.   On: 02/22/2022 16:38   Addendum Date: 02/22/2022   ADDENDUM REPORT: 02/22/2022 14:24 ADDENDUM: An addendum is made to this report following a discussion regarding the specimens  with Candice from pathology. Note that the specimen containers were incorrectly labeled/switched. The right axilla lymph node (biopsy site 1 per report and images) is located within specimen container labeled 2. The right breast 9 o'clock lymph node (biopsy site 2 per report and images) is located within specimen container labeled 1. The right axillary lymph node with sent in both saline and formalin. Electronically Signed   By: Everlean Alstrom M.D.   On: 02/22/2022 14:24   Result Date: 02/28/2022 CLINICAL DATA:  Patient presents for ultrasound-guided core biopsy of an abnormal lymph node in the right axilla as well as indeterminate lymph node in the right breast at the 9 o'clock position. EXAM: ULTRASOUND GUIDED RIGHT BREAST CORE NEEDLE BIOPSY COMPARISON:  Previous exam(s). PROCEDURE: I met with the patient and we discussed the procedure of ultrasound-guided biopsy, including benefits and alternatives. We discussed the high likelihood of a successful procedure. We discussed the risks of the procedure, including infection, bleeding, tissue injury, clip migration,  and inadequate sampling. Informed written consent was given. The usual time-out protocol was performed immediately prior to the procedure. Site 1: Right axilla lymph node: Lesion quadrant: Upper outer Using sterile technique and 1% Lidocaine as local anesthetic, under direct ultrasound visualization, a 14 gauge spring-loaded device was used to perform biopsy of an enlarged lymph node in the right axilla using a inferolateral to superomedial approach. At the conclusion of the procedure a HydroMARK spiral shaped tissue marker clip was deployed into the biopsy cavity. Follow up 2 view mammogram was performed and dictated separately. Site 2: Right breast 9 o'clock (incorrectly labeled as 10 o'clock on the images): Lesion quadrant: Upper outer Using sterile technique and 1% Lidocaine as local anesthetic, under direct ultrasound visualization, a 14 gauge  spring-loaded device was used to perform biopsy of the lymph node in the right breast at the 9 to 10 o'clock position using a lateral to medial approach. At the conclusion of the procedure a ribbon shaped tissue marker clip was deployed into the biopsy cavity. Follow up 2 view mammogram was performed and dictated separately. IMPRESSION: 1. Ultrasound-guided biopsy of a lymph node in the right axilla, at site of [facility] spiral shaped biopsy marking clip. 2. Ultrasound-guided biopsy of a lymph node in the right breast at the 9-10 o'clock position, at site of ribbon shaped biopsy marking clip. Electronically Signed: By: Everlean Alstrom M.D. On: 02/20/2022 10:25   [place] RT BREAST BX W LOC DEV EA ADD LESION IMG BX SPEC US GUIDE  Addendum Date: 02/28/2022   ADDENDUM REPORT: 02/28/2022 10:30 ADDENDUM: PATHOLOGY revealed: Site A. AXILLA, RIGHT, LYMPH NODE, NEEDLE CORE BIOPSY: - Positive for carcinoma. 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 No lymphoid tissue is identified. Clinical and radiologic correlation is suggested. This case was reviewed with Dr. Vic Ripper who agrees with the above interpretation. Pathology results are CONCORDANT with imaging findings, per Dr. Everlean Alstrom. PATHOLOGY revealed: Site B. LYMPH NODE, RIGHT BREAST, BIOPSY: - Negative for carcinoma. Pathology results are CONCORDANT with imaging findings, per Dr. Everlean Alstrom. Pathology results and recommendations below were discussed with patient by telephone on 02/28/2022. Patient reported biopsy site within normal limits with slight tenderness at the site. Post biopsy care instructions were reviewed, questions were answered and my direct phone number was provided to patient. Patient was instructed to call [facility] Mammography Department if any concerns or questions arise related to the biopsy. RECOMMENDATION: 1. Surgical consultation followup with provider/surgeon (Dr. Curlene Labrum). Biopsy results were called to provider office and report given to [facility] LPN. Carmell Austria will contact patient with appointment to discuss results with provider/surgeon. Referral was also sent to Kathi Der RT at [facility] Mammography Department, as patient requested to talk with [facility] regarding surgical and oncological referrals. 2. Patient has bilateral breast MRI scheduled for 03/02/2022 at 12 noon, and patient is aware. Pathology results reported by Electa Sniff RN on 02/27/2022. Electronically Signed   By: Everlean Alstrom M.D.   On: 02/28/2022 10:30   Addendum Date: 02/22/2022   ADDENDUM REPORT: 02/22/2022 16:38 ADDENDUM: An addendum is made to this report following a discussion with the interpreting pathologist 02/22/2022 at 4:30 p.m. Note that the specimen containers were NOT incorrectly labeled, and in fact were correct. The right axillary lymph node at biopsy site 1 was labeled correctly,  with the samples placed in the container labeled 1. The right breast 9 o'clock lymph node at biopsy site 2 was labeled correctly, with the samples placed in the container labeled 2. Electronically Signed   By: Everlean Alstrom M.D.   On: 02/22/2022 16:38   Addendum Date: 02/22/2022   ADDENDUM REPORT: 02/22/2022 14:24 ADDENDUM: An addendum is made to this report following a discussion regarding the specimens with Candice from pathology. Note that the specimen containers were incorrectly labeled/switched. The right axilla lymph node (biopsy site 1 per report and images) is located within specimen container labeled 2. The right breast 9 o'clock lymph node (biopsy site 2 per report and images) is located within specimen container labeled 1. The right axillary lymph node with sent in both saline and formalin.  Electronically Signed   By: Everlean Alstrom M.D.   On: 02/22/2022 14:24   Result Date: 02/28/2022 CLINICAL DATA:  Patient presents for ultrasound-guided core biopsy of an abnormal lymph node in the right axilla as well as indeterminate lymph node in the right breast at the 9 o'clock position. EXAM: ULTRASOUND GUIDED RIGHT BREAST CORE NEEDLE BIOPSY COMPARISON:  Previous exam(s). PROCEDURE: I met with the patient and we discussed the procedure of ultrasound-guided biopsy, including benefits and alternatives. We discussed the high likelihood of a successful procedure. We discussed the risks of the procedure, including infection, bleeding, tissue injury, clip migration, and inadequate sampling. Informed written consent was given. The usual time-out protocol was performed immediately prior to the procedure. Site 1: Right axilla lymph node: Lesion quadrant: Upper outer Using sterile technique and 1% Lidocaine as local anesthetic, under direct ultrasound visualization, a 14 gauge spring-loaded device was used to perform biopsy of an enlarged lymph node in the right axilla using a inferolateral to superomedial approach. At the conclusion of the procedure a HydroMARK spiral shaped tissue marker clip was deployed into the biopsy cavity. Follow up 2 view mammogram was performed and dictated separately. Site 2: Right breast 9 o'clock (incorrectly labeled as 10 o'clock on the images): Lesion quadrant: Upper outer Using sterile technique and 1% Lidocaine as local anesthetic, under direct ultrasound visualization, a 14 gauge spring-loaded device was used to perform biopsy of the lymph node in the right breast at the 9 to 10 o'clock position using a lateral to medial approach. At the conclusion of the procedure a ribbon shaped tissue marker clip was deployed into the biopsy cavity. Follow up 2 view mammogram was performed and dictated separately. IMPRESSION: 1. Ultrasound-guided biopsy of a lymph node in the right axilla, at  site of [facility] spiral shaped biopsy marking clip. 2. Ultrasound-guided biopsy of a lymph node in the right breast at the 9-10 o'clock position, at site of ribbon shaped biopsy marking clip. Electronically Signed: By: Everlean Alstrom M.D. On: 02/20/2022 10:25   MM CLIP PLACEMENT RIGHT  Result Date: 02/20/2022 CLINICAL DATA:  Post ultrasound-guided core biopsy an lymph node in the right axilla and ultrasound-guided core biopsy of an indeterminate lymph node in the right breast at the 9-10 o'clock position. EXAM: 3D DIAGNOSTIC RIGHT MAMMOGRAM POST ULTRASOUND BIOPSY COMPARISON:  Previous exam(s). FINDINGS: 3D Mammographic images were obtained following ultrasound-guided core biopsy an lymph node in the right axilla and ultrasound-guided core biopsy of an indeterminate lymph node in the right breast at the 9-10 o'clock position. A ribbon shaped biopsy marking clip is present at the site of the biopsied lymph node in the right breast the o'clock position. The [facility] spiral shaped biopsy  marking clip was not included in the field of view due to the far posterior location. IMPRESSION: 1. Ribbon shaped biopsy marking clip at site of biopsied lymph node in the right breast at the 9-10 o'clock position. 2. HydroMARK spiral shaped biopsy marking clip not included in the field of view. Final Assessment: Post Procedure Mammograms for Marker Placement Electronically Signed   By: Everlean Alstrom M.D.   On: 02/20/2022 10:27  MM DIAG BREAST TOMO BILATERAL  Result Date: 02/20/2022 CLINICAL DATA:  61 year old female recalled from screening mammography 03/01/2021 for right breast calcifications and subsequent benign discordant biopsy of these calcifications in the central posterior right breast December 2022 with excision recommended. The patient initially followed up with surgery in April however canceled her scheduled surgery and most recently followed up with Dr. [name] September 2023 with diagnostic imaging,  possible RF tag placement and subsequent excision recommended. EXAM: DIGITAL DIAGNOSTIC BILATERAL MAMMOGRAM WITH TOMOSYNTHESIS; ULTRASOUND RIGHT BREAST LIMITED TECHNIQUE: Bilateral digital diagnostic mammography and breast tomosynthesis was performed.; Targeted ultrasound examination of the right breast was performed COMPARISON:  Previous exam(s). ACR Breast Density Category b: There are scattered areas of fibroglandular density. FINDINGS: There is new skin and trabecular thickening involving the right breast. There is an asymmetry within the central right breast which spreads out on the additional imaging, and may be related to changes from prior biopsy (patient states she had massive bruising and likely hematoma following her biopsy in December) and associated trabecular thickening. Partially visualized enlarged lymph nodes are present in the right axilla. In addition, there is an oval mass in the outer right measuring 1 cm, likely an intramammary lymph node. Spot compression magnification views of the central posterior right breast were performed demonstrating benign lucent centered calcifications anteromedial and anterolateral to the X shaped biopsy marking clip. Few linear oriented calcifications are identified just posterior to the biopsy marking clip, however the previously seen extensive calcifications are not definitely identified today's exam. No suspicious masses or calcifications are seen in the left breast. Physical examination reveals generalized skin thickening and firmness involving the right breast. There is masslike enlargement of the right axilla. Targeted ultrasound of the right breast was performed. There is skin thickening and subcutaneous edema visualized throughout the right breast sonographically. A 1 cm intramammary lymph node with nodular cortical thickening is seen in the right breast at 9 o'clock 6 cm from nipple. This corresponds well with mass seen in the outer right breast at  mammography. The entire upper and lower central right breast was scanned with no definite masses or any other sonographic abnormalities identified. Targeted ultrasound of the right axilla was performed demonstrating at least 3 morphologically abnormal lymph nodes with massive cortical thickening. IMPRESSION: 1.  Suspicious right axillary lymphadenopathy. 2. Indeterminate intramammary lymph node in the right breast at 9 o'clock. 3. 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 4. Decreased calcifications noted at prior benign biopsy site in the lower central posterior right breast at site of X shaped biopsy marking clip. RECOMMENDATION: 1. Recommend ultrasound-guided biopsy of 1 of the enlarged lymph nodes in the right axilla, with specimen sent in both saline and formalin. 2. Recommend ultrasound-guided core biopsy of the intramammary lymph node in the right breast at the 9 o'clock position. 3. If biopsy results reveal invasive mammary carcinoma, bilateral breast MRI with contrast would be recommended. I have discussed the findings  and recommendations with the patient. If applicable, a reminder letter will be sent to the patient regarding the next appointment. BI-RADS CATEGORY  4: Suspicious. Electronically Signed   By: Everlean Alstrom M.D.   On: 02/20/2022 10:22  US BREAST LTD UNI RIGHT INC AXILLA  Result Date: 02/20/2022 CLINICAL DATA:  61 year old female recalled from screening mammography 03/01/2021 for right breast calcifications and subsequent benign discordant biopsy of these calcifications in the central posterior right breast December 2022 with excision recommended. The patient initially followed up with surgery in April however canceled her scheduled surgery and most recently followed up with Dr. [name] September 2023 with diagnostic imaging, possible RF  tag placement and subsequent excision recommended. EXAM: DIGITAL DIAGNOSTIC BILATERAL MAMMOGRAM WITH TOMOSYNTHESIS; ULTRASOUND RIGHT BREAST LIMITED TECHNIQUE: Bilateral digital diagnostic mammography and breast tomosynthesis was performed.; Targeted ultrasound examination of the right breast was performed COMPARISON:  Previous exam(s). ACR Breast Density Category b: There are scattered areas of fibroglandular density. FINDINGS: There is new skin and trabecular thickening involving the right breast. There is an asymmetry within the central right breast which spreads out on the additional imaging, and may be related to changes from prior biopsy (patient states she had massive bruising and likely hematoma following her biopsy in December) and associated trabecular thickening. Partially visualized enlarged lymph nodes are present in the right axilla. In addition, there is an oval mass in the outer right measuring 1 cm, likely an intramammary lymph node. Spot compression magnification views of the central posterior right breast were performed demonstrating benign lucent centered calcifications anteromedial and anterolateral to the X shaped biopsy marking clip. Few linear oriented calcifications are identified just posterior to the biopsy marking clip, however the previously seen extensive calcifications are not definitely identified today's exam. No suspicious masses or calcifications are seen in the left breast. Physical examination reveals generalized skin thickening and firmness involving the right breast. There is masslike enlargement of the right axilla. Targeted ultrasound of the right breast was performed. There is skin thickening and subcutaneous edema visualized throughout the right breast sonographically. A 1 cm intramammary lymph node with nodular cortical thickening is seen in the right breast at 9 o'clock 6 cm from nipple. This corresponds well with mass seen in the outer right breast at mammography. The  entire upper and lower central right breast was scanned with no definite masses or any other sonographic abnormalities identified. Targeted ultrasound of the right axilla was performed demonstrating at least 3 morphologically abnormal lymph nodes with massive cortical thickening. IMPRESSION: 1.  Suspicious right axillary lymphadenopathy. 2. Indeterminate intramammary lymph node in the right breast at 9 o'clock. 3. 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 4. Decreased calcifications noted at prior benign biopsy site in the lower central posterior right breast at site of X shaped biopsy marking clip. RECOMMENDATION: 1. Recommend ultrasound-guided biopsy of 1 of the enlarged lymph nodes in the right axilla, with specimen sent in both saline and formalin. 2. Recommend ultrasound-guided core biopsy of the intramammary lymph node in the right breast at the 9 o'clock position. 3. If biopsy results reveal invasive mammary carcinoma, bilateral breast MRI with contrast would be recommended. I have discussed the findings and recommendations with the patient. If applicable, a reminder letter will be sent to the patient regarding the next appointment. BI-RADS CATEGORY  4: Suspicious. Electronically Signed   By: Everlean Alstrom  M.D.   On: 02/20/2022 10:22    Orders Placed This Encounter  Procedures   NM PET Image Initial (PI) Skull Base To Thigh    Standing Status:   Future    Standing Expiration Date:   03/08/2023    Order Specific Question:   If indicated for the ordered procedure, I authorize the administration of a radiopharmaceutical per Radiology protocol    Answer:   Yes    Order Specific Question:   Preferred imaging location?    Answer:   [facility]   MR RT BREAST BX W LOC DEV 1ST LESION IMAGE BX [place] MR GUIDE    [facility] EPIC ORDER PF:03-02-2022  [place] LONG SPOKE WITH PT/DP    Standing Status:   Future    Standing Expiration Date:   03/09/2023    Order Specific Question:   Reason for Exam (SYMPTOM  OR DIAGNOSIS REQUIRED)    Answer:   confirm breast cancer    Order Specific Question:   Preferred Imaging Location?    Answer:   [id number] (table limit-550lbs)   Ambulatory referral to Genetics    Referral Priority:   Routine    Referral Type:   Consultation    Referral Reason:   Specialty Services Required    Number of Visits Requested:   1    All questions were answered. The patient knows to call the clinic with any problems, questions or concerns. The total time spent in the appointment was 60 minutes.     Truitt Merle, MD 03/08/2022   I, Wilburn Mylar, am acting as scribe for Truitt Merle, MD.   I have reviewed the above documentation for accuracy and completeness, and I agree with the above.

## 2022-03-09 ENCOUNTER — Telehealth: Payer: Self-pay | Admitting: Hematology

## 2022-03-09 ENCOUNTER — Other Ambulatory Visit: Payer: Self-pay

## 2022-03-09 ENCOUNTER — Other Ambulatory Visit: Payer: Self-pay | Admitting: Hematology

## 2022-03-09 DIAGNOSIS — C50811 Malignant neoplasm of overlapping sites of right female breast: Secondary | ICD-10-CM

## 2022-03-09 NOTE — Telephone Encounter (Signed)
Spoke to patient to give appointment for PET scan, patient requested a Wednesday due to work schedule. Gave patient location, time and prep, sending patient email with calendar and information of appointment per patients request

## 2022-03-12 ENCOUNTER — Other Ambulatory Visit: Payer: Self-pay | Admitting: Surgery

## 2022-03-12 DIAGNOSIS — C50911 Malignant neoplasm of unspecified site of right female breast: Secondary | ICD-10-CM | POA: Diagnosis not present

## 2022-03-13 ENCOUNTER — Telehealth: Payer: Self-pay | Admitting: *Deleted

## 2022-03-13 ENCOUNTER — Encounter: Payer: Self-pay | Admitting: *Deleted

## 2022-03-13 NOTE — Telephone Encounter (Signed)
Spoke to Marilyn Ford, provided navigation resources and contact information.  Marilyn Ford request to have chemo at [facility] and would like to keep Dr. Burr Medico as her med onc. Reached out to [place], Art therapist at [place]. Per Lilia Pro, Marilyn Ford will receive 1st chemo with Dr. Feng/[facility]. [facility] will pick up chemo on cycle 2 with an appt to see Dr. Redge Gainer  Received verbal understanding. Marilyn Ford appreciative for the work placed for her to receive chemo at [facility] as it is close to her home. Denies further questions or needs at this time.

## 2022-03-14 ENCOUNTER — Other Ambulatory Visit: Payer: Self-pay | Admitting: Hematology

## 2022-03-14 DIAGNOSIS — Z171 Estrogen receptor negative status [ER-]: Secondary | ICD-10-CM

## 2022-03-14 NOTE — Progress Notes (Signed)
START ON PATHWAY REGIMEN - Breast     Cycles 1 through 4: A cycle is every 21 days:     Pembrolizumab      Paclitaxel      Carboplatin      Filgrastim-xxxx    Cycles 5 through 8: A cycle is every 21 days:     Pembrolizumab      Doxorubicin      Cyclophosphamide      Pegfilgrastim-xxxx   **Always confirm dose/schedule in your pharmacy ordering system**  Patient Characteristics: Preoperative or Nonsurgical Candidate (Clinical Staging), Neoadjuvant Therapy followed by Surgery, Invasive Disease, Chemotherapy, HER2 Negative, ER Negative, Platinum Therapy Indicated and Candidate for Checkpoint Inhibitor Therapeutic Status: Preoperative or Nonsurgical Candidate (Clinical Staging) AJCC M Category: cM0 AJCC Grade: G3 Breast Surgical Plan: Neoadjuvant Therapy followed by Surgery ER Status: Negative (-) AJCC 8 Stage Grouping: IIIC HER2 Status: Negative (-) AJCC T Category: cT4 AJCC N Category: cN2 PR Status: Negative (-) Intent of Therapy: Curative Intent, Discussed with Patient

## 2022-03-15 ENCOUNTER — Telehealth: Payer: Self-pay | Admitting: Hematology

## 2022-03-15 ENCOUNTER — Other Ambulatory Visit: Payer: Self-pay

## 2022-03-15 NOTE — Progress Notes (Addendum)
COVID Vaccine Completed: yes  Date of COVID positive in last 90 days: no  PCP - Isla Pence, MD Cardiologist - n/a  Chest x-ray - n/a EKG - 03/19/22 Epic/chart Stress Test - n/a ECHO - 2016 Cardiac Cath - n/a Pacemaker/ICD device last checked: n/a Spinal Cord Stimulator: n/a  Bowel Prep - no  Sleep Study - n/a CPAP -   Fasting Blood Sugar - 130s Checks Blood Sugar 1 times a day  Last dose of GLP1 agonist- took Trulicity 15/83/09 GLP1 instructions:  N/A   Last dose of SGLT-2 inhibitors-  N/A SGLT-2 instructions: N/A   Blood Thinner Instructions: Aspirin Instructions: ASA 81, do not take DOS Last Dose:  Activity level: Can go up a flight of stairs and perform activities of daily living without stopping and without symptoms of chest pain or shortness of breath.   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 Patient was also instructed that they will need to review over the PAT instructions again at home before surgery.

## 2022-03-15 NOTE — Patient Instructions (Addendum)
SURGICAL WAITING ROOM VISITATION Patients having surgery or a procedure may have no more than 2 support people in the waiting area - these visitors may rotate.   Children under the age of 41 must have an adult with them who is not the patient. If the patient needs to stay at the hospital during part of their recovery, the visitor guidelines for inpatient rooms apply. Pre-op nurse will coordinate an appropriate time for 1 support person to accompany patient in pre-op.  This support person may not rotate.    Please refer to the [facility] website for the visitor guidelines for Inpatients (after your surgery is over and you are in a regular room).    Your procedure is scheduled on: 03/20/22   Report to [facility] Main Entrance    Report to admitting at 7:30 AM   Call this number if you have problems the morning of surgery [phone]   Do not eat food :After Midnight.   After Midnight you may have the following liquids until 6:30 AM DAY OF SURGERY  Water Non-Citrus Juices (without pulp, NO RED) Carbonated Beverages Black Coffee (NO MILK/CREAM OR CREAMERS, sugar ok)  Clear Tea (NO MILK/CREAM OR CREAMERS, sugar ok) regular and decaf                             Plain Jell-O (NO RED)                                           Fruit ices (not with fruit pulp, NO RED)                                     Popsicles (NO RED)                                                               Sports drinks like Gatorade (NO RED)                      If you have questions, please contact your surgeon's office.   FOLLOW BOWEL PREP AND ANY ADDITIONAL PRE OP INSTRUCTIONS YOU RECEIVED FROM YOUR SURGEON'S OFFICE!!!     Oral Hygiene is also important to reduce your risk of infection.                                    Remember - BRUSH YOUR TEETH THE MORNING OF SURGERY WITH YOUR REGULAR TOOTHPASTE   Take these medicines the morning of surgery with A SIP OF WATER: Amlodipine, Metoprolol,  Pantoprazole   DO NOT TAKE ANY ORAL DIABETIC MEDICATIONS DAY OF YOUR SURGERY  How to Manage Your Diabetes Before and After Surgery  Why is it important to control my blood sugar before and after surgery? Improving blood sugar levels before and after surgery helps healing and can limit problems. A way of improving blood sugar control is eating a healthy diet by:  Eating less sugar and carbohydrates  Increasing activity/exercise  Talking with your doctor about reaching your blood sugar goals High blood sugars (greater than 180 mg/dL) can raise your risk of infections and slow your recovery, so you will need to focus on controlling your diabetes during the weeks before surgery. Make sure that the doctor who takes care of your diabetes knows about your planned surgery including the date and location.  How do I manage my blood sugar before surgery? Check your blood sugar at least 4 times a day, starting 2 days before surgery, to make sure that the level is not too high or low. Check your blood sugar the morning of your surgery when you wake up and every 2 hours until you get to the Short Stay unit. If your blood sugar is less than 70 mg/dL, you will need to treat for low blood sugar: Do not take insulin. Treat a low blood sugar (less than 70 mg/dL) with  cup of clear juice (cranberry or apple), 4 glucose tablets, OR glucose gel. Recheck blood sugar in 15 minutes after treatment (to make sure it is greater than 70 mg/dL). If your blood sugar is not greater than 70 mg/dL on recheck, call [phone] for further instructions. Report your blood sugar to the short stay nurse when you get to Short Stay.  If you are admitted to the hospital after surgery: Your blood sugar will be checked by the staff and you will probably be given insulin after surgery (instead of oral diabetes medicines) to make sure you have good blood sugar levels. The goal for blood sugar control after surgery is 80-180  mg/dL.   WHAT DO I DO ABOUT MY DIABETES MEDICATION?  Hold Trulicity 69/62/95. May resume as normal post op.  DO NOT TAKE THE FOLLOWING 7 DAYS PRIOR TO SURGERY: Ozempic, Wegovy, Rybelsus (Semaglutide), Byetta (exenatide), Bydureon (exenatide ER), Victoza, Saxenda (liraglutide), or Trulicity (dulaglutide) Mounjaro (Tirzepatide) Adlyxin (Lixisenatide), Polyethylene Glycol Loxenatide.     Reviewed and Endorsed by [facility] Patient Education Committee, August 2015                              You may not have any metal on your body including hair pins, jewelry, and body piercing             Do not wear make-up, lotions, powders, perfumes, or deodorant  Do not wear nail polish including gel and S&S, artificial/acrylic nails, or any other type of covering on natural nails including finger and toenails. If you have artificial nails, gel coating, etc. that needs to be removed by a nail salon please have this removed prior to surgery or surgery may need to be canceled/ delayed if the surgeon/ anesthesia feels like they are unable to be safely monitored.   Do not shave  48 hours prior to surgery.    Do not bring valuables to the hospital. [place].  DO NOT [place]. PHARMACY WILL DISPENSE MEDICATIONS LISTED ON YOUR MEDICATION LIST TO YOU DURING YOUR ADMISSION [place]!    Patients discharged on the day of surgery will not be allowed to drive home.  Someone NEEDS to stay with you for the first 24 hours after anesthesia.              Please read over the following fact  sheets you were given: IF YOU HAVE QUESTIONS ABOUT YOUR PRE-OP INSTRUCTIONS PLEASE CALL [name]   If you received a COVID test during your pre-op visit  it is requested that you wear a mask when out in public, stay away from anyone that may not be feeling well and notify your surgeon if you develop symptoms. If you test positive  for Covid or have been in contact with anyone that has tested positive in the last 10 days please notify you surgeon.    [place] - Preparing for Surgery Before surgery, you can play an important role.  Because skin is not sterile, your skin needs to be as free of germs as possible.  You can reduce the number of germs on your skin by washing with CHG (chlorahexidine gluconate) soap before surgery.  CHG is an antiseptic cleaner which kills germs and bonds with the skin to continue killing germs even after washing. Please DO NOT use if you have an allergy to CHG or antibacterial soaps.  If your skin becomes reddened/irritated stop using the CHG and inform your nurse when you arrive at Short Stay. Do not shave (including legs and underarms) for at least 48 hours prior to the first CHG shower.  You may shave your face/neck.  Please follow these instructions carefully:  1.  Shower with CHG Soap the night before surgery and the  morning of surgery.  2.  If you choose to wash your hair, wash your hair first as usual with your normal  shampoo.  3.  After you shampoo, rinse your hair and body thoroughly to remove the shampoo.                             4.  Use CHG as you would any other liquid soap.  You can apply chg directly to the skin and wash.  Gently with a scrungie or clean washcloth.  5.  Apply the CHG Soap to your body ONLY FROM THE NECK DOWN.   Do   not use on face/ open                           Wound or open sores. Avoid contact with eyes, ears mouth and   genitals (private parts).                       Wash face,  Genitals (private parts) with your normal soap.             6.  Wash thoroughly, paying special attention to the area where your    surgery  will be performed.  7.  Thoroughly rinse your body with warm water from the neck down.  8.  DO NOT shower/wash with your normal soap after using and rinsing off the CHG Soap.                9.  Pat yourself dry with a clean towel.             10.  Wear clean pajamas.            11.  Place clean sheets on your bed the night of your first shower and do not  sleep with pets. Day of Surgery : Do not apply any lotions/deodorants the morning of surgery.  Please wear clean clothes to the hospital/surgery center.  FAILURE TO FOLLOW THESE  INSTRUCTIONS MAY RESULT IN THE CANCELLATION OF YOUR SURGERY  PATIENT SIGNATURE_________________________________  NURSE SIGNATURE__________________________________  ________________________________________________________________________

## 2022-03-15 NOTE — Telephone Encounter (Signed)
Spoke to patient to give her the new appointment date, time, location and prep for PET scan, patient voice understanding of all

## 2022-03-16 ENCOUNTER — Ambulatory Visit: Payer: No Typology Code available for payment source | Admitting: Hematology and Oncology

## 2022-03-16 ENCOUNTER — Other Ambulatory Visit: Payer: No Typology Code available for payment source

## 2022-03-19 ENCOUNTER — Ambulatory Visit
Admission: RE | Admit: 2022-03-19 | Discharge: 2022-03-19 | Disposition: A | Discharge status: Home / Self Care | Payer: No Typology Code available for payment source | Source: Ambulatory Visit | Attending: Hematology | Admitting: Hematology

## 2022-03-19 ENCOUNTER — Other Ambulatory Visit: Payer: Self-pay

## 2022-03-19 ENCOUNTER — Encounter (HOSPITAL_COMMUNITY)
Admission: RE | Admit: 2022-03-19 | Discharge: 2022-03-19 | Disposition: A | Discharge status: Home / Self Care | Payer: No Typology Code available for payment source | Source: Ambulatory Visit | Attending: Surgery | Admitting: Surgery

## 2022-03-19 ENCOUNTER — Other Ambulatory Visit (HOSPITAL_COMMUNITY): Payer: Self-pay | Admitting: Diagnostic Radiology

## 2022-03-19 ENCOUNTER — Encounter (HOSPITAL_COMMUNITY): Payer: Self-pay

## 2022-03-19 VITALS — BP 129/88 | HR 88 | Temp 98.4°F | Resp 14 | Ht 62.0 in | Wt 173.5 lb

## 2022-03-19 DIAGNOSIS — R9389 Abnormal findings on diagnostic imaging of other specified body structures: Secondary | ICD-10-CM

## 2022-03-19 DIAGNOSIS — E118 Type 2 diabetes mellitus with unspecified complications: Secondary | ICD-10-CM | POA: Diagnosis not present

## 2022-03-19 DIAGNOSIS — Z171 Estrogen receptor negative status [ER-]: Secondary | ICD-10-CM

## 2022-03-19 DIAGNOSIS — R928 Other abnormal and inconclusive findings on diagnostic imaging of breast: Secondary | ICD-10-CM | POA: Diagnosis not present

## 2022-03-19 DIAGNOSIS — N6011 Diffuse cystic mastopathy of right breast: Secondary | ICD-10-CM | POA: Diagnosis not present

## 2022-03-19 DIAGNOSIS — Z01818 Encounter for other preprocedural examination: Secondary | ICD-10-CM | POA: Diagnosis not present

## 2022-03-19 HISTORY — DX: Cardiac murmur, unspecified: R01.1

## 2022-03-19 HISTORY — DX: Pneumonia, unspecified organism: J18.9

## 2022-03-19 HISTORY — DX: Anxiety disorder, unspecified: F41.9

## 2022-03-19 HISTORY — DX: Gastro-esophageal reflux disease without esophagitis: K21.9

## 2022-03-19 HISTORY — DX: Personal history of urinary calculi: Z87.442

## 2022-03-19 LAB — GLUCOSE, CAPILLARY: Glucose-Capillary: 171 mg/dL — ABNORMAL HIGH (ref 70–99)

## 2022-03-19 MED ORDER — GADOPICLENOL 0.5 MMOL/ML IV SOLN: 10.0000 mL | Freq: Once | INTRAVENOUS | Status: DC | PRN

## 2022-03-19 NOTE — H&P (Signed)
REFERRING PHYSICIAN: Stacks, Loma Sender, MD  PROVIDER: Beverlee Nims, MD  MRN: [id number] DOB: 05/14/1960 DATE OF ENCOUNTER: 03/12/2022 Subjective  Chief Complaint: New Consultation and Breast Cancer   History of Present Illness: Marilyn Ford is a 61 y.o. female who is seen today as an office consultation for evaluation of New Consultation and Breast Cancer .  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 Urgent Port-A-Cath insertion is recommended to start IV chemotherapy as soon as possible. She actually has further biopsies of non-mass-like enhancement in the right breast planned next week as well. She has had lymph nodes in the axilla biopsied which are positive for malignancy. She has no previous problems with history regarding her breast. She started experiencing fullness in the inner lower quadrant of her right breast several months ago. She had presented with a palpable lymph node. Biopsy of the lymph node again was triple negative with a Ki-67 of 60%. She has had a previous stroke. She has no cardiopulmonary issues  Review of Systems: A complete review of systems was obtained from the patient. I have reviewed this information and discussed as appropriate with the patient. See HPI as well for other ROS.  ROS  Medical History: Past Medical History: Diagnosis Date Anxiety DVT (deep venous thrombosis) (CMS-HCC) GERD (gastroesophageal reflux disease) History of cancer History of stroke Hypertension  There is no problem list on file for this patient.  Past Surgical History: Procedure Laterality Date LAPAROSCOPIC CHOLECYSTECTOMY REPEAT CESAREAN SECTION   Allergies Allergen Reactions Penicillins Shortness Of Breath Sulfa (Sulfonamide Antibiotics) Rash  Current Outpatient Medications on File Prior to Visit Medication Sig Dispense Refill alcohol  swabs (BD ALCOHOL SWABS) PadM Test BS daily and as needed Dx E11.9 amLODIPine (NORVASC) 10 MG tablet Take 1 tablet by mouth once daily atorvastatin (LIPITOR) 80 MG tablet TAKE 1 TABLET EVERY DAY AT 6PM clopidogreL (PLAVIX) 75 mg tablet Take 75 mg by mouth once daily krill oil 500 mg Cap Take by mouth LORazepam (ATIVAN) 1 MG tablet Take one, one hour before your MRI meloxicam (MOBIC) 15 MG tablet Take by mouth metoprolol succinate (TOPROL-XL) 200 MG XL tablet TAKE 1 TABLET ONE TIME DAILY, WITH OR IMMEDIATELY FOLLOWING A MEAL pantoprazole (PROTONIX) 40 MG DR tablet TAKE 1 TABLET twice daily FOR STOMACH traZODone (DESYREL) 150 MG tablet TAKE 1 OR 2 TABLETS AT BEDTIME FOR SLEEP valsartan (DIOVAN) 320 MG tablet Take by mouth apple cider vinegar 500 mg Tab Take by mouth calcium carbonate-vit D3-min 600 mg calcium- 400 unit Tab Take 1 tablet by mouth once daily cranberry extract 425 mg Cap Take by mouth flaxseed oiL 1,000 mg Cap Take by mouth multivitamin capsule Take 1 capsule by mouth once daily  No current facility-administered medications on file prior to visit.  Family History Problem Relation Age of Onset Diabetes Brother   Social History  Tobacco Use Smoking Status Never Smokeless Tobacco Never   Social History  Socioeconomic History Marital status: Married Tobacco Use Smoking status: Never Smokeless tobacco: Never Substance and Sexual Activity Alcohol use: Not Currently Drug use: Never  Objective:  Vitals: 03/12/22 1425 BP: (!) 140/80 Pulse: 90 Temp: 36.7 C (98 F) SpO2: 96% Weight: 78.5 kg (173 lb) Height: 157.5 cm (_0 )  Body mass index is 31.64 kg/m.  Physical Exam  She appears well on exam  There are some swelling in her right axilla from her  biopsy. There is thickening of the skin of the breast and fullness in the inner quadrant on the right side  Labs, Imaging and Diagnostic Testing: I reviewed her mammograms, ultrasound, MRI, and pathology  results  Assessment and Plan:  Diagnoses and all orders for this visit:  Inflammatory breast cancer, right (CMS-HCC)    This appears a consistent with an inflammatory breast cancer which is triple negative. Urgent chemotherapy is recommended. I discussed the reasonings for this with her. She does understand she will eventually need a mastectomy and axillary dissection. For now, we will plan on proceeding with Port-A-Cath insertion next week. I discussed the procedure with her in detail. We discussed the risk of the procedure which includes but is not limited to bleeding, infection, injury to surrounding structures, pneumothorax, port failure, the need for other procedures, etc. She understands and agrees to proceed.

## 2022-03-20 ENCOUNTER — Encounter (HOSPITAL_COMMUNITY)
Admission: RE | Disposition: A | Discharge status: Home / Self Care | Payer: Self-pay | Source: Home / Self Care | Attending: Surgery

## 2022-03-20 ENCOUNTER — Ambulatory Visit (HOSPITAL_COMMUNITY): Payer: No Typology Code available for payment source | Admitting: Physician Assistant

## 2022-03-20 ENCOUNTER — Ambulatory Visit (HOSPITAL_COMMUNITY): Payer: No Typology Code available for payment source

## 2022-03-20 ENCOUNTER — Encounter: Payer: Self-pay | Admitting: *Deleted

## 2022-03-20 ENCOUNTER — Ambulatory Visit (HOSPITAL_BASED_OUTPATIENT_CLINIC_OR_DEPARTMENT_OTHER): Payer: No Typology Code available for payment source | Admitting: Registered Nurse

## 2022-03-20 ENCOUNTER — Other Ambulatory Visit: Payer: Self-pay

## 2022-03-20 ENCOUNTER — Encounter (HOSPITAL_COMMUNITY): Payer: Self-pay | Admitting: Surgery

## 2022-03-20 ENCOUNTER — Ambulatory Visit (HOSPITAL_COMMUNITY)
Admission: RE | Admit: 2022-03-20 | Discharge: 2022-03-20 | Disposition: A | Discharge status: Home / Self Care | Payer: No Typology Code available for payment source | Attending: Surgery | Admitting: Surgery

## 2022-03-20 DIAGNOSIS — I10 Essential (primary) hypertension: Secondary | ICD-10-CM | POA: Insufficient documentation

## 2022-03-20 DIAGNOSIS — Z8673 Personal history of transient ischemic attack (TIA), and cerebral infarction without residual deficits: Secondary | ICD-10-CM | POA: Insufficient documentation

## 2022-03-20 DIAGNOSIS — Z171 Estrogen receptor negative status [ER-]: Secondary | ICD-10-CM | POA: Diagnosis not present

## 2022-03-20 DIAGNOSIS — Z7985 Long-term (current) use of injectable non-insulin antidiabetic drugs: Secondary | ICD-10-CM

## 2022-03-20 DIAGNOSIS — C50911 Malignant neoplasm of unspecified site of right female breast: Secondary | ICD-10-CM | POA: Insufficient documentation

## 2022-03-20 DIAGNOSIS — K219 Gastro-esophageal reflux disease without esophagitis: Secondary | ICD-10-CM | POA: Diagnosis not present

## 2022-03-20 DIAGNOSIS — E118 Type 2 diabetes mellitus with unspecified complications: Secondary | ICD-10-CM

## 2022-03-20 DIAGNOSIS — Z79899 Other long term (current) drug therapy: Secondary | ICD-10-CM | POA: Insufficient documentation

## 2022-03-20 DIAGNOSIS — F419 Anxiety disorder, unspecified: Secondary | ICD-10-CM | POA: Diagnosis not present

## 2022-03-20 DIAGNOSIS — E119 Type 2 diabetes mellitus without complications: Secondary | ICD-10-CM

## 2022-03-20 DIAGNOSIS — Z833 Family history of diabetes mellitus: Secondary | ICD-10-CM | POA: Insufficient documentation

## 2022-03-20 DIAGNOSIS — Z87891 Personal history of nicotine dependence: Secondary | ICD-10-CM | POA: Diagnosis not present

## 2022-03-20 HISTORY — PX: PORTACATH PLACEMENT: SHX2246

## 2022-03-20 LAB — GLUCOSE, CAPILLARY
Glucose-Capillary: 174 mg/dL — ABNORMAL HIGH (ref 70–99)
Glucose-Capillary: 160 mg/dL — ABNORMAL HIGH (ref 70–99)

## 2022-03-20 SURGERY — INSERTION, TUNNELED CENTRAL VENOUS DEVICE, WITH PORT
Anesthesia: General

## 2022-03-20 MED ORDER — FENTANYL CITRATE (PF) 100 MCG/2ML IJ SOLN
INTRAMUSCULAR | Status: DC | PRN
  Administered 2022-03-20: 50 ug via INTRAVENOUS

## 2022-03-20 MED ORDER — SUGAMMADEX SODIUM 200 MG/2ML IV SOLN
INTRAVENOUS | Status: DC | PRN
  Administered 2022-03-20: 200 mg via INTRAVENOUS

## 2022-03-20 MED ORDER — FENTANYL CITRATE PF 50 MCG/ML IJ SOSY: 25.0000 ug | PREFILLED_SYRINGE | INTRAMUSCULAR | Status: DC | PRN

## 2022-03-20 MED ORDER — ACETAMINOPHEN 500 MG PO TABS
1000.0000 mg | ORAL_TABLET | ORAL | Status: AC
  Administered 2022-03-20: 1000 mg via ORAL
  Filled 2022-03-20: qty 2

## 2022-03-20 MED ORDER — HEPARIN 6000 UNIT IRRIGATION SOLUTION
Freq: Once | Status: AC
  Administered 2022-03-20: 1
  Filled 2022-03-20: qty 6000

## 2022-03-20 MED ORDER — BUPIVACAINE HCL (PF) 0.5 % IJ SOLN
INTRAMUSCULAR | Status: AC
  Filled 2022-03-20: qty 30

## 2022-03-20 MED ORDER — ORAL CARE MOUTH RINSE: 15.0000 mL | Freq: Once | OROMUCOSAL | Status: AC

## 2022-03-20 MED ORDER — OXYCODONE HCL 5 MG/5ML PO SOLN: 5.0000 mg | Freq: Once | ORAL | Status: DC | PRN

## 2022-03-20 MED ORDER — LIDOCAINE HCL (PF) 2 % IJ SOLN
INTRAMUSCULAR | Status: AC
  Filled 2022-03-20: qty 5

## 2022-03-20 MED ORDER — ONDANSETRON HCL 4 MG/2ML IJ SOLN
INTRAMUSCULAR | Status: DC | PRN
  Administered 2022-03-20: 4 mg via INTRAVENOUS

## 2022-03-20 MED ORDER — PROPOFOL 10 MG/ML IV BOLUS
INTRAVENOUS | Status: DC | PRN
  Administered 2022-03-20: 160 mg via INTRAVENOUS

## 2022-03-20 MED ORDER — DEXAMETHASONE SODIUM PHOSPHATE 10 MG/ML IJ SOLN
INTRAMUSCULAR | Status: DC | PRN
  Administered 2022-03-20: 8 mg via INTRAVENOUS

## 2022-03-20 MED ORDER — CIPROFLOXACIN IN D5W 400 MG/200ML IV SOLN
400.0000 mg | INTRAVENOUS | Status: AC
  Administered 2022-03-20: 400 mg via INTRAVENOUS
  Filled 2022-03-20: qty 200

## 2022-03-20 MED ORDER — CHLORHEXIDINE GLUCONATE 0.12 % MT SOLN
15.0000 mL | Freq: Once | OROMUCOSAL | Status: AC
  Administered 2022-03-20: 15 mL via OROMUCOSAL

## 2022-03-20 MED ORDER — FENTANYL CITRATE (PF) 100 MCG/2ML IJ SOLN
INTRAMUSCULAR | Status: AC
  Filled 2022-03-20: qty 2

## 2022-03-20 MED ORDER — ACETAMINOPHEN 500 MG PO TABS: 1000.0000 mg | ORAL_TABLET | Freq: Once | ORAL | Status: DC | PRN

## 2022-03-20 MED ORDER — 0.9 % SODIUM CHLORIDE (POUR BTL) OPTIME
TOPICAL | Status: DC | PRN
  Administered 2022-03-20: 1000 mL

## 2022-03-20 MED ORDER — OXYCODONE HCL 5 MG PO TABS: 5.0000 mg | ORAL_TABLET | Freq: Once | ORAL | Status: DC | PRN

## 2022-03-20 MED ORDER — HEPARIN SOD (PORK) LOCK FLUSH 100 UNIT/ML IV SOLN
INTRAVENOUS | Status: DC | PRN
  Administered 2022-03-20: 500 [IU] via INTRAVENOUS

## 2022-03-20 MED ORDER — LIDOCAINE 2% (20 MG/ML) 5 ML SYRINGE
INTRAMUSCULAR | Status: DC | PRN
  Administered 2022-03-20: 80 mg via INTRAVENOUS

## 2022-03-20 MED ORDER — MIDAZOLAM HCL 5 MG/5ML IJ SOLN
INTRAMUSCULAR | Status: DC | PRN
  Administered 2022-03-20: 2 mg via INTRAVENOUS

## 2022-03-20 MED ORDER — BUPIVACAINE HCL (PF) 0.5 % IJ SOLN
INTRAMUSCULAR | Status: DC | PRN
  Administered 2022-03-20: 4 mL

## 2022-03-20 MED ORDER — ACETAMINOPHEN 160 MG/5ML PO SOLN: 1000.0000 mg | Freq: Once | ORAL | Status: DC | PRN

## 2022-03-20 MED ORDER — HEPARIN SOD (PORK) LOCK FLUSH 100 UNIT/ML IV SOLN
INTRAVENOUS | Status: AC
  Filled 2022-03-20: qty 5

## 2022-03-20 MED ORDER — PROPOFOL 10 MG/ML IV BOLUS
INTRAVENOUS | Status: AC
  Filled 2022-03-20: qty 20

## 2022-03-20 MED ORDER — CHLORHEXIDINE GLUCONATE CLOTH 2 % EX PADS: 6.0000 | MEDICATED_PAD | Freq: Once | CUTANEOUS | Status: DC

## 2022-03-20 MED ORDER — ONDANSETRON HCL 4 MG/2ML IJ SOLN
INTRAMUSCULAR | Status: AC
  Filled 2022-03-20: qty 2

## 2022-03-20 MED ORDER — TRAMADOL HCL 50 MG PO TABS: 50.0000 mg | ORAL_TABLET | Freq: Four times a day (QID) | ORAL | 0 refills | Status: DC | PRN

## 2022-03-20 MED ORDER — MIDAZOLAM HCL 2 MG/2ML IJ SOLN
INTRAMUSCULAR | Status: AC
  Filled 2022-03-20: qty 2

## 2022-03-20 MED ORDER — DEXAMETHASONE SODIUM PHOSPHATE 10 MG/ML IJ SOLN
INTRAMUSCULAR | Status: AC
  Filled 2022-03-20: qty 1

## 2022-03-20 MED ORDER — ACETAMINOPHEN 10 MG/ML IV SOLN: 1000.0000 mg | Freq: Once | INTRAVENOUS | Status: DC | PRN

## 2022-03-20 MED ORDER — ROCURONIUM BROMIDE 100 MG/10ML IV SOLN
INTRAVENOUS | Status: DC | PRN
  Administered 2022-03-20: 80 mg via INTRAVENOUS

## 2022-03-20 MED ORDER — LACTATED RINGERS IV SOLN: INTRAVENOUS | Status: DC

## 2022-03-20 SURGICAL SUPPLY — 42 items
ADH SKN CLS APL DERMABOND .7 (GAUZE/BANDAGES/DRESSINGS)
APL SKNCLS STERI-STRIP NONHPOA (GAUZE/BANDAGES/DRESSINGS)
BAG COUNTER SPONGE SURGICOUNT (BAG) IMPLANT
BAG DECANTER FOR FLEXI CONT (MISCELLANEOUS) ×1 IMPLANT
BAG SPNG CNTER NS LX DISP (BAG)
BENZOIN TINCTURE PRP APPL 2/3 (GAUZE/BANDAGES/DRESSINGS) IMPLANT
BLADE HEX COATED 2.75 (ELECTRODE) ×1 IMPLANT
BLADE SURG 15 STRL LF DISP TIS (BLADE) ×1 IMPLANT
BLADE SURG 15 STRL SS (BLADE) ×1
BLADE SURG SZ11 CARB STEEL (BLADE) ×1 IMPLANT
DERMABOND ADVANCED .7 DNX12 (GAUZE/BANDAGES/DRESSINGS) IMPLANT
DRAPE C-ARM 42X120 X-RAY (DRAPES) ×1 IMPLANT
DRAPE LAPAROSCOPIC ABDOMINAL (DRAPES) ×1 IMPLANT
DRSG TEGADERM 2-3/8X2-3/4 SM (GAUZE/BANDAGES/DRESSINGS) IMPLANT
DRSG TEGADERM 4X4.75 (GAUZE/BANDAGES/DRESSINGS) IMPLANT
ELECT REM PT RETURN 15FT ADLT (MISCELLANEOUS) ×1 IMPLANT
GAUZE 4X4 16PLY ~~LOC~~+RFID DBL (SPONGE) ×1 IMPLANT
GAUZE SPONGE 4X4 12PLY STRL (GAUZE/BANDAGES/DRESSINGS) IMPLANT
GLOVE BIO SURGEON STRL SZ7 (GLOVE) ×3 IMPLANT
GLOVE BIO SURGEON STRL SZ7.5 (GLOVE) ×1 IMPLANT
GLOVE BIOGEL PI IND STRL 7.0 (GLOVE) ×1 IMPLANT
GOWN STRL REUS W/ TWL XL LVL3 (GOWN DISPOSABLE) ×2 IMPLANT
GOWN STRL REUS W/TWL XL LVL3 (GOWN DISPOSABLE) ×2
KIT BASIN OR (CUSTOM PROCEDURE TRAY) ×1 IMPLANT
KIT PORT POWER 8FR ISP CVUE (Port) IMPLANT
KIT TURNOVER KIT A (KITS) IMPLANT
NDL HYPO 25X1 1.5 SAFETY (NEEDLE) ×1 IMPLANT
NEEDLE HYPO 25X1 1.5 SAFETY (NEEDLE) ×1 IMPLANT
NS IRRIG 1000ML POUR BTL (IV SOLUTION) ×1 IMPLANT
PACK BASIC VI WITH GOWN DISP (CUSTOM PROCEDURE TRAY) ×1 IMPLANT
PENCIL SMOKE EVACUATOR (MISCELLANEOUS) IMPLANT
SPIKE FLUID TRANSFER (MISCELLANEOUS) ×1 IMPLANT
STRIP CLOSURE SKIN 1/2X4 (GAUZE/BANDAGES/DRESSINGS) IMPLANT
SUT MNCRL AB 4-0 PS2 18 (SUTURE) ×1 IMPLANT
SUT PROLENE 2 0 SH DA (SUTURE) ×1 IMPLANT
SUT SILK 2 0 (SUTURE)
SUT SILK 2-0 30XBRD TIE 12 (SUTURE) IMPLANT
SUT VIC AB 3-0 SH 27 (SUTURE) ×1
SUT VIC AB 3-0 SH 27XBRD (SUTURE) ×1 IMPLANT
SYR 10ML LL (SYRINGE) ×1 IMPLANT
SYR CONTROL 10ML LL (SYRINGE) ×1 IMPLANT
TOWEL OR 17X26 10 PK STRL BLUE (TOWEL DISPOSABLE) ×1 IMPLANT

## 2022-03-20 NOTE — Anesthesia Procedure Notes (Addendum)
Procedure Name: Intubation Date/Time: 03/20/2022 11:00 AM  Performed by: Victoriano Lain, CRNAPre-anesthesia Checklist: Patient identified, Suction available, Patient being monitored, Timeout performed and Emergency Drugs available Patient Re-evaluated:Patient Re-evaluated prior to induction Oxygen Delivery Method: Circle system utilized Preoxygenation: Pre-oxygenation with 100% oxygen Induction Type: IV induction, Rapid sequence and Cricoid Pressure applied Laryngoscope Size: Glidescope and 4 Grade View: Grade I Tube type: Parker flex tip Tube size: 7.5 mm Number of attempts: 1 Airway Equipment and Method: Stylet Placement Confirmation: ETT inserted through vocal cords under direct vision, positive ETCO2 and breath sounds checked- equal and bilateral Secured at: 21 cm Tube secured with: Tape Dental Injury: Teeth and Oropharynx as per pre-operative assessment

## 2022-03-20 NOTE — Anesthesia Preprocedure Evaluation (Signed)
Anesthesia Evaluation  Patient identified by MRN, date of birth, ID band Patient awake    Reviewed: Allergy & Precautions, NPO status , Patient's Chart, lab work & pertinent test results  History of Anesthesia Complications Negative for: history of anesthetic complications  Airway Mallampati: IV  TM Distance: >3 FB Neck ROM: Full    Dental  (+) Teeth Intact, Dental Advisory Given,    Pulmonary neg shortness of breath, neg sleep apnea, neg COPD, neg recent URI, former smoker   breath sounds clear to auscultation       Cardiovascular hypertension, Pt. on medications and Pt. on home beta blockers (-) angina (-) Past MI and (-) CHF (-) dysrhythmias  Rhythm:Regular     Neuro/Psych  PSYCHIATRIC DISORDERS Anxiety     Right leg CVA, Residual Symptoms    GI/Hepatic Neg liver ROS,GERD  Controlled and Medicated,,  Endo/Other  diabetes, Type 2    Renal/GU negative Renal ROS     Musculoskeletal   Abdominal   Peds  Hematology   Anesthesia Other Findings   Reproductive/Obstetrics                             Anesthesia Physical Anesthesia Plan  ASA: 3  Anesthesia Plan: General   Post-op Pain Management: Tylenol PO (pre-op)*   Induction: Intravenous and Rapid sequence  PONV Risk Score and Plan: 3 and Ondansetron and Dexamethasone  Airway Management Planned: Oral ETT and Video Laryngoscope Planned  Additional Equipment: None  Intra-op Plan:   Post-operative Plan: Extubation in OR  Informed Consent: I have reviewed the patients History and Physical, chart, labs and discussed the procedure including the risks, benefits and alternatives for the proposed anesthesia with the patient or authorized representative who has indicated his/her understanding and acceptance.     Dental advisory given  Plan Discussed with: CRNA  Anesthesia Plan Comments:        Anesthesia Quick Evaluation

## 2022-03-20 NOTE — Transfer of Care (Signed)
Immediate Anesthesia Transfer of Care Note  Patient: Marilyn Ford  Procedure(s) Performed: INSERTION PORT-A-CATH  Patient Location: PACU  Anesthesia Type:General  Level of Consciousness: awake, alert , oriented, and patient cooperative  Airway & Oxygen Therapy: Patient Spontanous Breathing and Patient connected to face mask oxygen  Post-op Assessment: Report given to RN, Post -op Vital signs reviewed and stable, and Patient moving all extremities  Post vital signs: Reviewed and stable  Last Vitals:  Vitals Value Taken Time  BP 142/91 03/20/22 1148  Temp    Pulse 87 03/20/22 1151  Resp    SpO2 95 % 03/20/22 1151  Vitals shown include unvalidated device data.  Last Pain:  Vitals:   03/20/22 0735  TempSrc:   PainSc: 0-No pain         Complications: No notable events documented.

## 2022-03-20 NOTE — Discharge Instructions (Signed)
You may shower starting tomorrow  Ice pack, Tylenol, and ibuprofen also for pain   No vigorous activity for 1 week

## 2022-03-20 NOTE — Op Note (Signed)
INSERTION PORT-A-CATH  Procedure Note  Marilyn Ford 03/20/2022   Pre-op Diagnosis: RIGHT BREAST CANCER     Post-op Diagnosis: same  Procedure(s): INSERTION PORT-A-CATH LEFT SUBCLAVIAN VEIN (8FR)  Surgeon(s): Coralie Keens, MD  Anesthesia: General  Staff:  Circulator: Launa Grill, RN Radiology Technologist: Houston Siren, RT Relief Circulator: Tamala Julian, RN Scrub Person: Eliane Decree N  Estimated Blood Loss: Minimal               Indications: This is a 61 year old female with likely inflammatory right breast cancer.  The decision was made to proceed with neoadjuvant chemotherapy.  Port-A-Cath insertion was recommended  Procedure: The patient was brought to the operating room and identifies correct patient.  She was placed upon the operating table and general anesthesia was induced.  Her left chest and neck were then prepped and draped in usual sterile fashion.  The patient was placed in the Trendelenburg position.  I anesthetized the left chest and clavicle with Marcaine.  I then used the introducer needle to easily cannulate the left subclavian vein.  A wire was passed through the needle and into the central venous system.  This was confirmed under fluoroscopy.  I then made an incision on the patient's chest at the wire and reduction site with a scalpel.  I then dissected down to subcutaneous tissue with the cautery.  I then created a pocket for the port.  An 8 French [place] port was brought to the field and flushed appropriately.  I passed the venous dilator introducer sheath over the wire and into the central venous system.  The wire and dilator were then removed.  The catheter was attached to the port and cut an appropriate length.  I placed the port into the pocket and then fed the catheter down the peel-away sheath.  The sheath was peeled away leaving the catheter in central venous system.  Placement in the superior vena cava was confirmed with  fluoroscopy.  I accessed the port and good flush and return were demonstrated.  I then instilled concentrated heparin solution into the port.  The port was sewn to the chest wall 2 separate 3-0 Prolene sutures.  I then closed the subcutaneous tissue with interrupted 3-0 Vicryl sutures and closed the skin with a running 4-0 Monocryl.  Dermabond was then applied.  The patient tolerated the procedure well.  All the counts were correct at the end of the procedure.  The patient was then extubated in the operating room and taken in stable addition to the recovery room.          Coralie Keens   Date: 03/20/2022  Time: 11:33 AM

## 2022-03-21 ENCOUNTER — Encounter (HOSPITAL_COMMUNITY): Payer: Self-pay | Admitting: Surgery

## 2022-03-21 ENCOUNTER — Telehealth: Payer: Self-pay | Admitting: Family Medicine

## 2022-03-21 NOTE — Telephone Encounter (Signed)
Called patient, no answer 

## 2022-03-21 NOTE — Anesthesia Postprocedure Evaluation (Signed)
Anesthesia Post Note  Patient: Marilyn Ford  Procedure(s) Performed: INSERTION PORT-A-CATH     Patient location during evaluation: PACU Anesthesia Type: General Level of consciousness: awake and alert Pain management: pain level controlled Vital Signs Assessment: post-procedure vital signs reviewed and stable Respiratory status: spontaneous breathing, nonlabored ventilation and respiratory function stable Cardiovascular status: blood pressure returned to baseline and stable Postop Assessment: no apparent nausea or vomiting Anesthetic complications: no   No notable events documented.  Last Vitals:  Vitals:   03/20/22 1245 03/20/22 1300  BP:    Pulse: 80 84  Resp: 20 19  Temp: 36.7 C   SpO2: 94% 94%    Last Pain:  Vitals:   03/20/22 1300  TempSrc:   PainSc: 3                  Marykatherine Sherwood

## 2022-03-21 NOTE — Telephone Encounter (Signed)
As soon as it available

## 2022-03-21 NOTE — Telephone Encounter (Signed)
Patient reports she is concerned because she only had small cheeseburger for lunch at 11 am and blood sugar at 4 pm is 240.   Is taking Trulicity '3mg'$ /.45m weekly, states is not working well anymore.   Wants to know when she should start the 4.5 mg?

## 2022-03-21 NOTE — Telephone Encounter (Signed)
Patient received her Trulicity in the mail and would like to speak to Almyra Free about it before she takes. She said  that her blood sugar has been high but she does not want to take any of the medicine until she talks to someone about it.

## 2022-03-22 ENCOUNTER — Ambulatory Visit (HOSPITAL_COMMUNITY)
Admission: RE | Admit: 2022-03-22 | Discharge: 2022-03-22 | Disposition: A | Discharge status: Home / Self Care | Payer: No Typology Code available for payment source | Source: Ambulatory Visit | Attending: Hematology | Admitting: Hematology

## 2022-03-22 DIAGNOSIS — Z171 Estrogen receptor negative status [ER-]: Secondary | ICD-10-CM | POA: Diagnosis not present

## 2022-03-22 DIAGNOSIS — R911 Solitary pulmonary nodule: Secondary | ICD-10-CM | POA: Diagnosis not present

## 2022-03-22 DIAGNOSIS — C50811 Malignant neoplasm of overlapping sites of right female breast: Secondary | ICD-10-CM | POA: Insufficient documentation

## 2022-03-22 MED ORDER — FLUDEOXYGLUCOSE F - 18 (FDG) INJECTION
9.1200 | Freq: Once | INTRAVENOUS | Status: AC | PRN
  Administered 2022-03-22: 9.12 via INTRAVENOUS

## 2022-03-22 NOTE — Telephone Encounter (Signed)
Call returned to patient No answer, no VM available/full Please touch base with patient and instruct her to take trulicity 4.5 mg dose as scheduled--it would be 7 days after her last trulicity dose was given

## 2022-03-22 NOTE — Telephone Encounter (Signed)
PATIENT AWARE

## 2022-03-23 ENCOUNTER — Other Ambulatory Visit: Payer: Self-pay

## 2022-03-23 NOTE — Progress Notes (Signed)
Pharmacist Chemotherapy Monitoring - Initial Assessment    Anticipated start date: 04/02/22   The following has been reviewed per standard work regarding the patient's treatment regimen: The patient's diagnosis, treatment plan and drug doses, and organ/hematologic function Lab orders and baseline tests specific to treatment regimen  The treatment plan start date, drug sequencing, and pre-medications Prior authorization status  Patient's documented medication list, including drug-drug interaction screen and prescriptions for anti-emetics and supportive care specific to the treatment regimen The drug concentrations, fluid compatibility, administration routes, and timing of the medications to be used The patient's access for treatment and lifetime cumulative dose history, if applicable  The patient's medication allergies and previous infusion related reactions, if applicable   Changes made to treatment plan:  N/A  Follow up needed:  Pending authorization for treatment  and prescriptions needed for anti-emetics   Kennith Center, Pharm.D., CPP 03/23/2022'@3'$ :22 PM

## 2022-03-25 NOTE — Assessment & Plan Note (Signed)
-  diagnosed in 02/2022. Bilateral mammogram and right [place] on 02/20/22 showed: suspicious right axillary lymphadenopathy; new right breast skin and trabecular thickening, concerning for inflammatory breast cancer. Biopsy same day of a right axillary lymph node confirmed grade 3 carcinoma with metaplastic feature --breast MRI 03/02/22 showed: suspicious 3.8 cm area of enhancement in UOQ right breast; diffuse skin thickening; numerous bulky matted lymph nodes in right axilla; left breast negative. 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 -right breast biopsy on 11/13 was negative for malignant cells

## 2022-03-26 ENCOUNTER — Inpatient Hospital Stay (HOSPITAL_BASED_OUTPATIENT_CLINIC_OR_DEPARTMENT_OTHER): Payer: No Typology Code available for payment source | Admitting: Hematology

## 2022-03-26 ENCOUNTER — Telehealth: Payer: Self-pay

## 2022-03-26 DIAGNOSIS — C50811 Malignant neoplasm of overlapping sites of right female breast: Secondary | ICD-10-CM | POA: Diagnosis not present

## 2022-03-26 DIAGNOSIS — Z171 Estrogen receptor negative status [ER-]: Secondary | ICD-10-CM | POA: Diagnosis not present

## 2022-03-26 MED ORDER — LIDOCAINE-PRILOCAINE 2.5-2.5 % EX CREA: TOPICAL_CREAM | CUTANEOUS | 3 refills | Status: DC

## 2022-03-26 MED ORDER — ONDANSETRON HCL 8 MG PO TABS: 8.0000 mg | ORAL_TABLET | Freq: Three times a day (TID) | ORAL | 1 refills | Status: DC | PRN

## 2022-03-26 MED ORDER — PROCHLORPERAZINE MALEATE 10 MG PO TABS: 10.0000 mg | ORAL_TABLET | Freq: Four times a day (QID) | ORAL | 1 refills | Status: DC | PRN

## 2022-03-26 NOTE — Telephone Encounter (Signed)
Attempted to notify Patient of prior authorization approval for Lidocaine-Prilocaine 2.5% Cream (EMLA). Unable to reach Patient at this time. Unable to leave voicemail due to mailbox being full.

## 2022-03-26 NOTE — Progress Notes (Signed)
Marilyn Ford   [phone] [phone]   Clinic Follow up Note   Patient Care Team: Claretta Fraise, MD as PCP - General (Family Medicine) Claretta Fraise, MD (Family Medicine) Lavera Guise, [facility] as Pharmacist (Family Medicine) Montez Morita, Quillian Quince, MD as Consulting Physician (Gastroenterology) Aviva Signs, MD as Consulting Physician (General Surgery) Mauro Kaufmann, RN as Oncology Nurse Navigator Rockwell Germany, RN as Oncology Nurse Navigator Truitt Merle, MD as Consulting Physician (Hematology)  Date of Service:  03/26/2022  I connected with Marilyn Ford on 03/26/22 at  4:00 PM EST by telephone and verified that I am speaking with the correct person using two identifiers.   I discussed the limitations, risks, security and privacy concerns of performing an evaluation and management service by telephone and the availability of in person appointments. I also discussed with the patient that there may be a patient responsible charge related to this service. The patient expressed understanding and agreed to proceed.   Patient's location:  Home  Provider's location:  Office   CHIEF COMPLAINT: f/u of Right Breast Cancer, triple negative   CURRENT THERAPY:  -neoadjuvant Carbo Taxal, weekly, starting on 04/02/2022 -Keytruda,q21d 04/02/2022   ASSESSMENT:  Marilyn Ford is a 61 y.o. 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 Bilateral mammogram and right [place] on 02/20/22 showed: suspicious right axillary lymphadenopathy; new right breast skin and trabecular thickening, concerning for inflammatory breast cancer. Biopsy same day of a right axillary lymph node confirmed grade 3 carcinoma with metaplastic feature --breast MRI 03/02/22 showed: suspicious 3.8 cm area of enhancement in UOQ right breast; diffuse skin thickening; numerous bulky matted lymph nodes in right axilla; left breast  negative. 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 -right breast biopsy on 11/13 was negative for malignant cells  -I personally reviewed her PET scan images and discussed the findings, which showed hypermetabolic right axillary and subpectoral adenopathy, no other primary site or metastatic disease -Based on her PET scan findings, and biopsy path IHC, this is most consistent witwith nodal metastasis,h right inflammatory breast cancer  -Chemotherapy consent: Side effects including but does not not limited to, fatigue, nausea, vomiting, diarrhea, hair loss, neuropathy, fluid retention, renal and kidney dysfunction, neutropenic fever, needed for blood transfusion, bleeding, were discussed with patient in great detail. She agrees to proceed. -will schedule Chemo class this week. 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 The patient initially followed up with surgery in April however canceled her scheduled surgery and most recently followed up with Dr. [name] September 2023 with diagnostic imaging, possible RF tag placement and subsequent excision recommended.   EXAM: DIGITAL DIAGNOSTIC BILATERAL MAMMOGRAM WITH TOMOSYNTHESIS; ULTRASOUND RIGHT BREAST LIMITED  MPRESSION: 1.  Suspicious right axillary lymphadenopathy.   2. Indeterminate intramammary lymph node in the right breast at 9 o'clock.   3. 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   4. Decreased calcifications noted at prior benign biopsy site in the lower central posterior right breast at site of X shaped biopsy marking clip.   02/20/2022 Initial Biopsy   FINAL MICROSCOPIC DIAGNOSIS:   A. AXILLA, RIGHT, LYMPH NODE, NEEDLE CORE BIOPSY:  - Positive for carcinoma (see Comment)   B. LYMPH NODE, RIGHT BREAST, BIOPSY:  - Negative for carcinoma   COMMENT:  Part A: Morphology and immunohistochemical staining are most compatible with primary breast carcinoma with metaplastic changes, however differential diagnosis also includes urothelial carcinoma and less likely primary lung carcinoma (squamous).  No lymphoid tissue is identified.  Clinical and radiologic correlation is suggested.   ADDENDUM:  In case of a breast origin, the appropriate grade would be grade 3  (3+3+2)   ADDENDUM:  PROGNOSTIC INDICATOR RESULTS:  The tumor cells are EQUIVOCAL for Her2 (2+).  Estrogen Receptor:       0%, NEGATIVE  Progesterone Receptor:   0%, NEGATIVE  Proliferation Marker Ki-67:   60%   ADDENDUM:  FLOURESCENCE IN-SITU HYBRIDIZATION RESULTS:  GROUP 5:   HER2 **NEGATIVE**    03/02/2022 Imaging   EXAM: BILATERAL BREAST MRI WITH AND WITHOUT CONTRAST  IMPRESSION: 1. There is  a suspicious 3.8 cm area of patchy non mass enhancement in a linear orientation in the slightly upper outer right breast in the mid to posterior depth spanning 3.8 cm.   2. Diffuse skin thickening with enhancement of the skin, concerning for inflammatory breast cancer.   3. Numerous bulky matted lymph nodes in the right axilla, one of which corresponds with the biopsy-proven metastatic lymph node.   4.  No evidence of left breast malignancy.   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 ([place])   03/08/2022 Cancer Staging   Staging form: Breast, AJCC 8th Edition - Clinical stage from 03/08/2022: Stage IIIC (cT4d, cN2, cM0, G3, ER-, PR-, HER2-) - Signed by Truitt Merle, MD on 03/08/2022 Histologic grading system: 3 grade system   03/19/2022 Pathology Results   Diagnosis Breast, right, needle core biopsy, upper outer quadrant, barbell clip BENIGN BREAST WITH FIBROCYSTIC CHANGES INCLUDING STROMAL FIBROSIS, ADENOSIS AND USUAL DUCT HYPERPLASIA BENIGN FIBROMATOID CHANGE NEGATIVE FOR MICROCALCIFICATIONS NEGATIVE FOR CARCINOMA   03/22/2022 PET scan   IMPRESSION: Bulky hypermetabolic right axillary and subpectoral lymphadenopathy, consistent with metastatic disease.   No other definite sites of metastatic disease identified.   6 mm right lower lobe pulmonary nodule shows no FDG uptake, but is too small to definitively characterize by PET. Recommend continued follow-up by chest CT in 3-4 months.   Aortic Atherosclerosis (ICD10-I70.0).   04/02/2022 -  Chemotherapy   Patient is on Treatment Plan : BREAST Pembrolizumab (200) D1 + Carboplatin (5) D1 + Paclitaxel (80) D1,8,15 q21d X 4 cycles / Pembrolizumab (200) D1 + AC D1 q21d x 4  cycles        INTERVAL HISTORY:  Marilyn Ford is here for a follow up of  Right Breast Cancer, ER- She was last seen by me on 03/08/2022 She presents phone visit accompany by husband.  Pt states that the biopsy went  well. Still swollen at the breast   All other systems were reviewed with the patient and are negative.  MEDICAL HISTORY:  Past Medical History:  Diagnosis Date   Allergy    Anxiety    Breast cancer ([place])    Diabetes mellitus without complication ([place])    GERD (gastroesophageal reflux disease)    Heart murmur    as a child   History of kidney stones    Hypertension    Pneumonia    Stroke ([place])    Vaginal Pap smear, abnormal     SURGICAL HISTORY: Past Surgical History:  Procedure Laterality Date   CESAREAN SECTION     CHOLECYSTECTOMY     COLONOSCOPY WITH PROPOFOL N/A 07/19/2020   Procedure: COLONOSCOPY WITH PROPOFOL;  Surgeon: Harvel Quale, MD;  Location: [facility];  Service: Gastroenterology;  Laterality: N/A;  AM   POLYPECTOMY  07/19/2020   Procedure: POLYPECTOMY;  Surgeon: Harvel Quale, MD;  Location: [facility];  Service: Gastroenterology;;   PORTACATH PLACEMENT N/A 03/20/2022   Procedure: INSERTION PORT-A-CATH;  Surgeon: Coralie Keens, MD;  Location: [facility] ORS;  Service: General;  Laterality: N/A;    I have reviewed the social history and family history with the patient and they are unchanged from previous note.  ALLERGIES:  is allergic to penicillins and sulfa antibiotics.  MEDICATIONS:  Current Outpatient Medications  Medication Sig Dispense Refill   Alcohol Swabs (B-D SINGLE USE SWABS REGULAR) PADS Test BS daily and as needed Dx E11.9 100 each 3   amLODipine (NORVASC) 10 MG tablet 1 tablet daily 90 tablet 3   Apple Cider Vinegar 500 MG TABS Take 500 mg by mouth in the morning.     aspirin 81 MG EC tablet Take 1 tablet (81 mg total) by mouth daily. 30 tablet 0   atorvastatin (LIPITOR) 80 MG tablet TAKE 1 TABLET EVERY DAY AT 6PM 90 tablet 3   Blood Glucose Calibration (TRUE METRIX LEVEL 1) Low SOLN Use with glucometer Dx E11.9 3 each 0   Blood Glucose Monitoring Suppl (TRUE METRIX AIR GLUCOSE METER) w/Device KIT Test BS daily and as  needed Dx E11.9 1 kit 0   Calcium Carb-Cholecalciferol (CALCIUM-VITAMIN D) 600-400 MG-UNIT TABS Take 1 tablet by mouth in the morning.     Coenzyme Q10 (COQ10) 100 MG CAPS Take 100 mg by mouth in the morning.     Cranberry 425 MG CAPS Take 425 mg by mouth in the morning and at bedtime.     Dulaglutide (TRULICITY) 4.5 YK/5.9DJ SOPN Inject 4.5 mg as directed once a week. (Patient taking differently: Inject 4.5 mg as directed every Saturday.) 6 mL 3   Flaxseed, Linseed, (FLAXSEED OIL) 1000 MG CAPS Take 1,000 mg by mouth in the morning.     glucose blood (TRUE METRIX BLOOD GLUCOSE TEST) test strip Test BS daily and as needed Dx E11.9 100 each 3   Krill Oil 500 MG CAPS Take 3 capsules (1,500 mg total) by mouth in the morning and at bedtime.     lidocaine-prilocaine (EMLA) cream Apply to affected area once 30 g 3   meloxicam (MOBIC) 15 MG tablet Take 1 tablet (15 mg total) by mouth daily.  For joint and muscle pain 30 tablet 5   metoprolol (TOPROL-XL) 200 MG 24 hr tablet TAKE 1 TABLET ONE TIME DAILY, WITH OR IMMEDIATELY FOLLOWING A MEAL 90 tablet 3   Multiple Vitamin (MULTIVITAMIN) capsule Take 1 capsule by mouth in the morning.     ondansetron (ZOFRAN) 8 MG tablet Take 1 tablet (8 mg total) by mouth every 8 (eight) hours as needed for nausea or vomiting. Start on the third day after chemotherapy. 30 tablet 1   pantoprazole (PROTONIX) 40 MG tablet TAKE 1 TABLET twice daily FOR STOMACH 180 tablet 3   prochlorperazine (COMPAZINE) 10 MG tablet Take 1 tablet (10 mg total) by mouth every 6 (six) hours as needed for nausea or vomiting. 30 tablet 1   traMADol (ULTRAM) 50 MG tablet Take 1 tablet (50 mg total) by mouth every 6 (six) hours as needed for moderate pain or severe pain. 20 tablet 0   traZODone (DESYREL) 150 MG tablet TAKE 1 OR 2 TABLETS AT BEDTIME FOR SLEEP (Patient taking differently: Take 75 mg by mouth at bedtime.) 180 tablet 3   TRUEplus Lancets 33G MISC Test BS daily and as needed Dx E11.9 100 each  3   valsartan (DIOVAN) 320 MG tablet TAKE 1 TABLET (320 MG TOTAL) BY MOUTH DAILY. FOR BLOOD PRESSURE. 90 tablet 1   zinc gluconate 50 MG tablet Take 50 mg by mouth daily.     No current facility-administered medications for this visit.    PHYSICAL EXAMINATION: ECOG PERFORMANCE STATUS: 1 - Symptomatic but completely ambulatory  There were no vitals filed for this visit. Wt Readings from Last 3 Encounters:  03/20/22 173 lb 8 oz (78.7 kg)  03/19/22 173 lb 8 oz (78.7 kg)  03/08/22 173 lb 11.2 oz (78.8 kg)    No exam today   LABORATORY DATA:  I have reviewed the data as listed    Latest Ref Rng & Units 03/07/2022    9:42 AM 12/05/2021    8:49 AM 09/04/2021    2:12 PM  CBC  WBC 3.4 - 10.8 x10E3/uL 9.6  8.0  10.9   Hemoglobin 11.1 - 15.9 g/dL 14.1  13.9  15.1   Hematocrit 34.0 - 46.6 % 40.3  40.9  43.8   Platelets 150 - 450 x10E3/uL 336  237  276         Latest Ref Rng & Units 03/07/2022    9:42 AM 12/05/2021    8:49 AM 09/04/2021    2:12 PM  CMP  Glucose 70 - 99 mg/dL 222  162  136   BUN 8 - 27 mg/dL _0 Creatinine 0.57 - 1.00 mg/dL 1.00  1.03  0.91   Sodium 134 - 144 mmol/L 141  140  139   Potassium 3.5 - 5.2 mmol/L 4.6  4.3  3.7   Chloride 96 - 106 mmol/L 100  98  101   CO2 20 - 29 mmol/L _1 Calcium 8.7 - 10.3 mg/dL 10.1  9.5  10.5   Total Protein 6.0 - 8.5 g/dL 7.6  7.0    Total Bilirubin 0.0 - 1.2 mg/dL 0.4  0.4    Alkaline Phos 44 - 121 IU/L 153  119    AST 0 - 40 IU/L 17  23    ALT 0 - 32 IU/L 21  26        RADIOGRAPHIC STUDIES: I have personally reviewed the radiological images as listed  and agreed with the findings in the report. No results found.     I discussed the assessment and treatment plan with the patient. The patient was provided an opportunity to ask questions and all were answered. The patient agreed with the plan and demonstrated an understanding of the instructions.   The patient was advised to call back or seek an in-person  evaluation if the symptoms worsen or if the condition fails to improve as anticipated.  I provided 25 minutes of non face-to-face telephone visit time during this encounter, and > 50% was spent counseling as documented under my assessment & plan.      Truitt Merle, MD 03/26/2022   Felicity Coyer, CMA, am acting as scribe for Truitt Merle, MD.   I have reviewed the above documentation for accuracy and completeness, and I agree with the above.

## 2022-03-27 ENCOUNTER — Telehealth: Payer: Self-pay | Admitting: Hematology

## 2022-03-27 NOTE — Telephone Encounter (Signed)
Scheduled per 11/21 in basket, called and sent to voicemail, but voicemail is full

## 2022-03-28 ENCOUNTER — Other Ambulatory Visit: Payer: Self-pay

## 2022-03-28 ENCOUNTER — Other Ambulatory Visit (HOSPITAL_COMMUNITY): Payer: No Typology Code available for payment source

## 2022-03-28 ENCOUNTER — Inpatient Hospital Stay: Payer: No Typology Code available for payment source

## 2022-03-28 ENCOUNTER — Inpatient Hospital Stay: Payer: No Typology Code available for payment source | Admitting: Pharmacist

## 2022-03-28 DIAGNOSIS — I69351 Hemiplegia and hemiparesis following cerebral infarction affecting right dominant side: Secondary | ICD-10-CM

## 2022-03-28 DIAGNOSIS — Z171 Estrogen receptor negative status [ER-]: Secondary | ICD-10-CM

## 2022-03-28 NOTE — Progress Notes (Signed)
[place]       Telephone: [phone]?Fax: [phone]   Oncology Clinical Pharmacist Practitioner Initial Assessment  Marilyn Ford is a 61 y.o. female with a diagnosis of breast cancer. They were contacted today via telephone visit.  I connected with Marilyn Ford today by telephone and verified that I was speaking with the correct person using two patient identifiers.   Other persons participating in the visit and their role in the encounter: none   Patient's location: home  Provider's location: clinic  Indication/Regimen Pembrolizumab, paclitaxel, carboplatin followed by pembrolizumab, doxorubicin, cyclophosphamide is being used appropriately for treatment of breast cancer by Dr. Truitt Merle.      Wt Readings from Last 1 Encounters:  03/20/22 173 lb 8 oz (78.7 kg)    Estimated body surface area is 1.86 meters squared as calculated from the following:   Height as of 03/20/22: _0  (1.575 m).   Weight as of 03/20/22: 173 lb 8 oz (78.7 kg).  The dosing regimen cycle is every 21 days x 4 cycles  Pembrolizumab (200 mg) on day 1 Paclitaxel (80 mg/m2) on days 1, 8, 15 Carboplatin (AUC 5) on day 1  Followed by the below regimen cycle every 21 days x 4 cycles  Pembrolizumab (200 mg) day 1 Doxorubicin (60 mg/m2) day 1 Cyclophosphamide (600 mg/m2) day 1  It is planned to continue until  approximately 24 weeks in the neoadjuvant setting . Tentative start date is: 04/02/22.  Dose Modifications None   Allergies Allergies  Allergen Reactions   Penicillins Shortness Of Breath   Sulfa Antibiotics Rash    Vitals    03/20/2022    1:00 PM 03/20/2022   12:45 PM 03/20/2022   12:30 PM  Oncology Vitals  Temp  98 F (36.7 C)   Pulse Rate 84 80 82  BP   128/83  Resp _1 SpO2 94 % 94 % 92 %     Laboratory Data Today was a virtual visit, no labs were done  Contraindications Contraindications were reviewed? Yes Contraindications to  therapy were identified? No   Safety Precautions The following safety precautions were reviewed:  Fever: reviewed the importance of having a thermometer and the Centers for Disease Control and Prevention (CDC) definition of fever which is 100.25F (38C) or higher. Patient should call 24/7 triage at [phone] if experiencing a fever or any other symptoms Decreased white blood cells (WBCs) and increased risk for infection Decreased platelet count and increased risk of bleeding Decreased hemoglobin, part of the red blood cells that carry iron and oxygen Nausea or vomiting Diarrhea or constipation Hair Loss Fatigue Changes in liver function Rash Peripheral Neuropathy Hypersensitivity reactions Pembrolizumab toxicities (skin, lung, liver, thyroid, kidneys, vision Changes in color of urine Mouth sores MDS/AML Handling body fluids and waste We discussed that Ms. Grant should let her infusion nurse know immediately if she starts to feel any symptoms while she is being administered her treatment regimen. She verbalized understanding of the plan We also discussed that she can use loratadine OTC for bone pain that she may experience from growth factor support.  Medication Reconciliation Current Outpatient Medications  Medication Sig Dispense Refill   Alcohol Swabs (B-D SINGLE USE SWABS REGULAR) PADS Test BS daily and as needed Dx E11.9 100 each 3   amLODipine (NORVASC) 10 MG tablet 1 tablet daily 90 tablet 3   Apple Cider Vinegar 500 MG TABS Take 500 mg by mouth in the  morning.     aspirin 81 MG EC tablet Take 1 tablet (81 mg total) by mouth daily. 30 tablet 0   atorvastatin (LIPITOR) 80 MG tablet TAKE 1 TABLET EVERY DAY AT 6PM 90 tablet 3   Blood Glucose Calibration (TRUE METRIX LEVEL 1) Low SOLN Use with glucometer Dx E11.9 3 each 0   Blood Glucose Monitoring Suppl (TRUE METRIX AIR GLUCOSE METER) w/Device KIT Test BS daily and as needed Dx E11.9 1 kit 0   Calcium Carb-Cholecalciferol  (CALCIUM-VITAMIN D) 600-400 MG-UNIT TABS Take 1 tablet by mouth in the morning.     Coenzyme Q10 (COQ10) 100 MG CAPS Take 100 mg by mouth in the morning.     Cranberry 425 MG CAPS Take 425 mg by mouth in the morning and at bedtime.     Dulaglutide (TRULICITY) 4.5 YD/7.4JO SOPN Inject 4.5 mg as directed once a week. (Patient taking differently: Inject 4.5 mg as directed every Saturday.) 6 mL 3   Flaxseed, Linseed, (FLAXSEED OIL) 1000 MG CAPS Take 1,000 mg by mouth in the morning.     glucose blood (TRUE METRIX BLOOD GLUCOSE TEST) test strip Test BS daily and as needed Dx E11.9 100 each 3   Krill Oil 500 MG CAPS Take 3 capsules (1,500 mg total) by mouth in the morning and at bedtime.     meloxicam (MOBIC) 15 MG tablet Take 1 tablet (15 mg total) by mouth daily. For joint and muscle pain 30 tablet 5   metoprolol (TOPROL-XL) 200 MG 24 hr tablet TAKE 1 TABLET ONE TIME DAILY, WITH OR IMMEDIATELY FOLLOWING A MEAL 90 tablet 3   Multiple Vitamin (MULTIVITAMIN) capsule Take 1 capsule by mouth in the morning.     pantoprazole (PROTONIX) 40 MG tablet TAKE 1 TABLET twice daily FOR STOMACH 180 tablet 3   traZODone (DESYREL) 150 MG tablet TAKE 1 OR 2 TABLETS AT BEDTIME FOR SLEEP (Patient taking differently: Take 75 mg by mouth at bedtime.) 180 tablet 3   TRUEplus Lancets 33G MISC Test BS daily and as needed Dx E11.9 100 each 3   valsartan (DIOVAN) 320 MG tablet TAKE 1 TABLET (320 MG TOTAL) BY MOUTH DAILY. FOR BLOOD PRESSURE. 90 tablet 1   zinc gluconate 50 MG tablet Take 50 mg by mouth daily.     lidocaine-prilocaine (EMLA) cream Apply to affected area once (Patient not taking: Reported on 03/28/2022) 30 g 3   ondansetron (ZOFRAN) 8 MG tablet Take 1 tablet (8 mg total) by mouth every 8 (eight) hours as needed for nausea or vomiting. Start on the third day after chemotherapy. (Patient not taking: Reported on 03/28/2022) 30 tablet 1   prochlorperazine (COMPAZINE) 10 MG tablet Take 1 tablet (10 mg total) by mouth  every 6 (six) hours as needed for nausea or vomiting. (Patient not taking: Reported on 03/28/2022) 30 tablet 1   traMADol (ULTRAM) 50 MG tablet Take 1 tablet (50 mg total) by mouth every 6 (six) hours as needed for moderate pain or severe pain. (Patient not taking: Reported on 03/28/2022) 20 tablet 0   No current facility-administered medications for this visit.    Medication reconciliation is based on the patient's most recent medication list in the electronic medical record (EMR) including herbal products and OTC medications.   The patient's medication list was reviewed today with the patient? Yes   Drug-drug interactions (DDIs) DDIs were evaluated? Yes Significant DDIs identified? No   Drug-Food Interactions Drug-food interactions were evaluated? Yes Drug-food interactions identified? No  Follow-up Plan  Tentative start date 04/02/22 with Dr. Burr Medico visit same today and labs She will likely also be followed by [facility] which is closer to her house We reviewed how to take her anti-nausea medication prescriptions and to call Dr. Ernestina Penna clinic with any questions or concerns We reviewed that she can take loratadine for bone pain that she may experience with growth factor support Clinical pharmacy will deliver folder reviewing our discussion to Dr. Ernestina Penna nurse to be picked up by patient on 04/02/22 Clinical pharmacy will schedule a follow up visit in approximately 12 weeks to again review second half of treatment regimen  Marilyn Ford participated in the discussion, expressed understanding, and voiced agreement with the above plan. All questions were answered to her satisfaction. The patient was advised to contact the clinic at [phone] with any questions or concerns prior to her return visit.   I spent 60 minutes assessing the patient.  Raina Mina, RPH-CPP, 03/28/2022 10:01 AM  **Disclaimer: This note was dictated with voice recognition software. Similar sounding words  can inadvertently be transcribed and this note may contain transcription errors which may not have been corrected upon publication of note.**

## 2022-03-30 ENCOUNTER — Telehealth: Payer: Self-pay | Admitting: Pharmacist

## 2022-03-30 MED FILL — Fosaprepitant Dimeglumine For IV Infusion 150 MG (Base Eq): INTRAVENOUS | Qty: 5 | Status: AC

## 2022-03-30 NOTE — Telephone Encounter (Signed)
Scheduled appointment per 11/22 los. Left voicemail.

## 2022-04-02 ENCOUNTER — Inpatient Hospital Stay: Payer: No Typology Code available for payment source

## 2022-04-02 ENCOUNTER — Encounter: Payer: Self-pay | Admitting: *Deleted

## 2022-04-02 ENCOUNTER — Other Ambulatory Visit: Payer: Self-pay

## 2022-04-02 ENCOUNTER — Ambulatory Visit (HOSPITAL_COMMUNITY)
Admission: RE | Admit: 2022-04-02 | Discharge: 2022-04-02 | Disposition: A | Discharge status: Home / Self Care | Payer: No Typology Code available for payment source | Source: Ambulatory Visit | Attending: Hematology | Admitting: Hematology

## 2022-04-02 ENCOUNTER — Inpatient Hospital Stay: Payer: No Typology Code available for payment source | Admitting: Hematology

## 2022-04-02 VITALS — BP 114/73 | HR 81 | Temp 98.4°F | Resp 18 | Wt 183.8 lb

## 2022-04-02 DIAGNOSIS — Z171 Estrogen receptor negative status [ER-]: Secondary | ICD-10-CM | POA: Diagnosis not present

## 2022-04-02 DIAGNOSIS — C50811 Malignant neoplasm of overlapping sites of right female breast: Secondary | ICD-10-CM | POA: Diagnosis not present

## 2022-04-02 DIAGNOSIS — Z452 Encounter for adjustment and management of vascular access device: Secondary | ICD-10-CM | POA: Diagnosis not present

## 2022-04-02 DIAGNOSIS — N6313 Unspecified lump in the right breast, lower outer quadrant: Secondary | ICD-10-CM

## 2022-04-02 DIAGNOSIS — Z5112 Encounter for antineoplastic immunotherapy: Secondary | ICD-10-CM | POA: Diagnosis not present

## 2022-04-02 LAB — CBC WITH DIFFERENTIAL (CANCER CENTER ONLY)
Abs Immature Granulocytes: 0.02 10*3/uL (ref 0.00–0.07)
Basophils Absolute: 0.1 10*3/uL (ref 0.0–0.1)
Basophils Relative: 1 %
Eosinophils Absolute: 0.2 10*3/uL (ref 0.0–0.5)
Eosinophils Relative: 3 %
HCT: 38.4 % (ref 36.0–46.0)
Hemoglobin: 13.5 g/dL (ref 12.0–15.0)
Immature Granulocytes: 0 %
Lymphocytes Relative: 17 %
Lymphs Abs: 1.3 10*3/uL (ref 0.7–4.0)
MCH: 30.5 pg (ref 26.0–34.0)
MCHC: 35.2 g/dL (ref 30.0–36.0)
MCV: 86.9 fL (ref 80.0–100.0)
Monocytes Absolute: 0.4 10*3/uL (ref 0.1–1.0)
Monocytes Relative: 5 %
Neutro Abs: 5.7 10*3/uL (ref 1.7–7.7)
Neutrophils Relative %: 74 %
Platelet Count: 228 10*3/uL (ref 150–400)
RBC: 4.42 MIL/uL (ref 3.87–5.11)
RDW: 13.1 % (ref 11.5–15.5)
WBC Count: 7.7 10*3/uL (ref 4.0–10.5)
nRBC: 0 % (ref 0.0–0.2)

## 2022-04-02 LAB — CMP (CANCER CENTER ONLY)
ALT: 26 U/L (ref 0–44)
AST: 23 U/L (ref 15–41)
Albumin: 4.3 g/dL (ref 3.5–5.0)
Alkaline Phosphatase: 105 U/L (ref 38–126)
Anion gap: 8 (ref 5–15)
BUN: 15 mg/dL (ref 8–23)
CO2: 27 mmol/L (ref 22–32)
Calcium: 10.1 mg/dL (ref 8.9–10.3)
Chloride: 103 mmol/L (ref 98–111)
Creatinine: 0.97 mg/dL (ref 0.44–1.00)
GFR, Estimated: >60 mL/min (ref >60)
Glucose, Bld: 167 mg/dL — ABNORMAL HIGH (ref 70–99)
Potassium: 4.3 mmol/L (ref 3.5–5.1)
Sodium: 138 mmol/L (ref 135–145)
Total Bilirubin: 0.6 mg/dL (ref 0.3–1.2)
Total Protein: 7.4 g/dL (ref 6.5–8.1)

## 2022-04-02 LAB — TSH: TSH: 0.617 u[IU]/mL (ref 0.350–4.500)

## 2022-04-02 MED ORDER — SODIUM CHLORIDE 0.9 % IV SOLN
150.0000 mg | Freq: Once | INTRAVENOUS | Status: AC
  Administered 2022-04-02: 150 mg via INTRAVENOUS
  Filled 2022-04-02: qty 150

## 2022-04-02 MED ORDER — SODIUM CHLORIDE 0.9 % IV SOLN
504.0000 mg | Freq: Once | INTRAVENOUS | Status: AC
  Administered 2022-04-02: 500 mg via INTRAVENOUS
  Filled 2022-04-02: qty 50

## 2022-04-02 MED ORDER — FAMOTIDINE IN NACL 20-0.9 MG/50ML-% IV SOLN
20.0000 mg | Freq: Once | INTRAVENOUS | Status: AC
  Administered 2022-04-02: 20 mg via INTRAVENOUS
  Filled 2022-04-02: qty 50

## 2022-04-02 MED ORDER — SODIUM CHLORIDE 0.9 % IV SOLN
200.0000 mg | Freq: Once | INTRAVENOUS | Status: AC
  Administered 2022-04-02: 200 mg via INTRAVENOUS
  Filled 2022-04-02: qty 200

## 2022-04-02 MED ORDER — SODIUM CHLORIDE 0.9 % IV SOLN
80.0000 mg/m2 | Freq: Once | INTRAVENOUS | Status: AC
  Administered 2022-04-02: 150 mg via INTRAVENOUS
  Filled 2022-04-02: qty 25

## 2022-04-02 MED ORDER — SODIUM CHLORIDE 0.9% FLUSH
10.0000 mL | INTRAVENOUS | Status: DC | PRN
  Administered 2022-04-02: 10 mL

## 2022-04-02 MED ORDER — HEPARIN SOD (PORK) LOCK FLUSH 100 UNIT/ML IV SOLN
500.0000 [IU] | Freq: Once | INTRAVENOUS | Status: AC | PRN
  Administered 2022-04-02: 500 [IU]

## 2022-04-02 MED ORDER — DIPHENHYDRAMINE HCL 50 MG/ML IJ SOLN
25.0000 mg | Freq: Once | INTRAMUSCULAR | Status: AC
  Administered 2022-04-02: 25 mg via INTRAVENOUS
  Filled 2022-04-02: qty 1

## 2022-04-02 MED ORDER — SODIUM CHLORIDE 0.9 % IV SOLN: Freq: Once | INTRAVENOUS | Status: AC

## 2022-04-02 MED ORDER — PALONOSETRON HCL INJECTION 0.25 MG/5ML
0.2500 mg | Freq: Once | INTRAVENOUS | Status: AC
  Administered 2022-04-02: 0.25 mg via INTRAVENOUS
  Filled 2022-04-02: qty 5

## 2022-04-02 NOTE — Progress Notes (Signed)
CXR order to confirm Port-a-Carth Tip placement.  MD notes does not state where Port tip terminates in pt's heart.

## 2022-04-02 NOTE — Patient Instructions (Signed)
[facility]  Discharge Instructions: Thank you for choosing [facility] to provide your oncology and hematology care.   If you have a lab appointment with the [facility], please go directly to the [facility] and check in at the registration area.   Wear comfortable clothing and clothing appropriate for easy access to any Portacath or PICC line.   We strive to give you quality time with your provider. You may need to reschedule your appointment if you arrive late (15 or more minutes).  Arriving late affects you and other patients whose appointments are after yours.  Also, if you miss three or more appointments without notifying the office, you may be dismissed from the clinic at the provider's discretion.      For prescription refill requests, have your pharmacy contact our office and allow 72 hours for refills to be completed.    Today you received the following chemotherapy and/or immunotherapy agents: Keytruda, Paclitaxel, Carboplatin.       To help prevent nausea and vomiting after your treatment, we encourage you to take your nausea medication as directed.  BELOW ARE SYMPTOMS THAT SHOULD BE REPORTED IMMEDIATELY: *FEVER GREATER THAN 100.4 F (38 C) OR HIGHER *CHILLS OR SWEATING *NAUSEA AND VOMITING THAT IS NOT CONTROLLED WITH YOUR NAUSEA MEDICATION *UNUSUAL SHORTNESS OF BREATH *UNUSUAL BRUISING OR BLEEDING *URINARY PROBLEMS (pain or burning when urinating, or frequent urination) *BOWEL PROBLEMS (unusual diarrhea, constipation, pain near the anus) TENDERNESS IN MOUTH AND THROAT WITH OR WITHOUT PRESENCE OF ULCERS (sore throat, sores in mouth, or a toothache) UNUSUAL RASH, SWELLING OR PAIN  UNUSUAL VAGINAL DISCHARGE OR ITCHING   Items with * indicate a potential emergency and should be followed up as soon as possible or go to the Emergency Department if any problems should occur.  Please show the CHEMOTHERAPY ALERT CARD or IMMUNOTHERAPY  ALERT CARD at check-in to the Emergency Department and triage nurse.  Should you have questions after your visit or need to cancel or reschedule your appointment, please contact [facility]  Dept: [phone]  and follow the prompts.  Office hours are 8:00 a.m. to 4:30 p.m. Monday - Friday. Please note that voicemails left after 4:00 p.m. may not be returned until the following business day.  We are closed weekends and major holidays. You have access to a nurse at all times for urgent questions. Please call the main number to the clinic Dept: [phone] and follow the prompts.   For any non-urgent questions, you may also contact your provider using MyChart. We now offer e-Visits for anyone 54 and older to request care online for non-urgent symptoms. For details visit [email or web address].   Also download the MyChart app! Go to the app store, search "MyChart", open the app, select [facility], and log in with your MyChart username and password.  Masks are optional in the cancer centers. If you would like for your care team to wear a mask while they are taking care of you, please let them know. You may have one support person who is at least 61 years old accompany you for your appointments. Pembrolizumab Injection What is this medication? PEMBROLIZUMAB (PEM broe LIZ ue mab) treats some types of cancer. It works by helping your immune system slow or stop the spread of cancer cells. It is a monoclonal antibody. This medicine may be used for other purposes; ask your health care provider or pharmacist if you have questions. COMMON BRAND NAME(S):  Keytruda What should I tell my care team before I take this medication? They need to know if you have any of these conditions: Allogeneic stem cell transplant (uses someone else's stem cells) Autoimmune diseases, such as Crohn disease, ulcerative colitis, lupus History of chest radiation Nervous system problems, such as  Guillain-Barre syndrome, myasthenia gravis Organ transplant An unusual or allergic reaction to pembrolizumab, other medications, foods, dyes, or preservatives Pregnant or trying to get pregnant Breast-feeding How should I use this medication? This medication is injected into a vein. It is given by your care team in a hospital or clinic setting. A special MedGuide will be given to you before each treatment. Be sure to read this information carefully each time. Talk to your care team about the use of this medication in children. While it may be prescribed for children as young as 6 months for selected conditions, precautions do apply. Overdosage: If you think you have taken too much of this medicine contact a poison control center or emergency room at once. NOTE: This medicine is only for you. Do not share this medicine with others. What if I miss a dose? Keep appointments for follow-up doses. It is important not to miss your dose. Call your care team if you are unable to keep an appointment. What may interact with this medication? Interactions have not been studied. This list may not describe all possible interactions. Give your health care provider a list of all the medicines, herbs, non-prescription drugs, or dietary supplements you use. Also tell them if you smoke, drink alcohol, or use illegal drugs. Some items may interact with your medicine. What should I watch for while using this medication? Your condition will be monitored carefully while you are receiving this medication. You may need blood work while taking this medication. This medication may cause serious skin reactions. They can happen weeks to months after starting the medication. Contact your care team right away if you notice fevers or flu-like symptoms with a rash. The rash may be red or purple and then turn into blisters or peeling of the skin. You may also notice a red rash with swelling of the face, lips, or lymph nodes in your  neck or under your arms. Tell your care team right away if you have any change in your eyesight. Talk to your care team if you may be pregnant. Serious birth defects can occur if you take this medication during pregnancy and for 4 months after the last dose. You will need a negative pregnancy test before starting this medication. Contraception is recommended while taking this medication and for 4 months after the last dose. Your care team can help you find the option that works for you. Do not breastfeed while taking this medication and for 4 months after the last dose. What side effects may I notice from receiving this medication? Side effects that you should report to your care team as soon as possible: Allergic reactions--skin rash, itching, hives, swelling of the face, lips, tongue, or throat Dry cough, shortness of breath or trouble breathing Eye pain, redness, irritation, or discharge with blurry or decreased vision Heart muscle inflammation--unusual weakness or fatigue, shortness of breath, chest pain, fast or irregular heartbeat, dizziness, swelling of the ankles, feet, or hands Hormone gland problems--headache, sensitivity to light, unusual weakness or fatigue, dizziness, fast or irregular heartbeat, increased sensitivity to cold or heat, excessive sweating, constipation, hair loss, increased thirst or amount of urine, tremors or shaking, irritability Infusion reactions--chest pain, shortness   of breath or trouble breathing, feeling faint or lightheaded Kidney injury (glomerulonephritis)--decrease in the amount of urine, red or dark brown urine, foamy or bubbly urine, swelling of the ankles, hands, or feet Liver injury--right upper belly pain, loss of appetite, nausea, light-colored stool, dark yellow or brown urine, yellowing skin or eyes, unusual weakness or fatigue Pain, tingling, or numbness in the hands or feet, muscle weakness, change in vision, confusion or trouble speaking, loss of  balance or coordination, trouble walking, seizures Rash, fever, and swollen lymph nodes Redness, blistering, peeling, or loosening of the skin, including inside the mouth Sudden or severe stomach pain, bloody diarrhea, fever, nausea, vomiting Side effects that usually do not require medical attention (report to your care team if they continue or are bothersome): Bone, joint, or muscle pain Diarrhea Fatigue Loss of appetite Nausea Skin rash This list may not describe all possible side effects. Call your doctor for medical advice about side effects. You may report side effects to FDA at 1-800-FDA-1088. Where should I keep my medication? This medication is given in a hospital or clinic. It will not be stored at home. NOTE: This sheet is a summary. It may not cover all possible information. If you have questions about this medicine, talk to your doctor, pharmacist, or health care provider.  2023 Elsevier/Gold Standard (2013-01-12 00:00:00) Paclitaxel Injection What is this medication? PACLITAXEL (PAK li TAX el) treats some types of cancer. It works by slowing down the growth of cancer cells. This medicine may be used for other purposes; ask your health care provider or pharmacist if you have questions. COMMON BRAND NAME(S): Onxol, Taxol What should I tell my care team before I take this medication? They need to know if you have any of these conditions: Heart disease Liver disease Low white blood cell levels An unusual or allergic reaction to paclitaxel, other medications, foods, dyes, or preservatives If you or your partner are pregnant or trying to get pregnant Breast-feeding How should I use this medication? This medication is injected into a vein. It is given by your care team in a hospital or clinic setting. Talk to your care team about the use of this medication in children. While it may be given to children for selected conditions, precautions do apply. Overdosage: If you think you  have taken too much of this medicine contact a poison control center or emergency room at once. NOTE: This medicine is only for you. Do not share this medicine with others. What if I miss a dose? Keep appointments for follow-up doses. It is important not to miss your dose. Call your care team if you are unable to keep an appointment. What may interact with this medication? Do not take this medication with any of the following: Live virus vaccines Other medications may affect the way this medication works. Talk with your care team about all of the medications you take. They may suggest changes to your treatment plan to lower the risk of side effects and to make sure your medications work as intended. This list may not describe all possible interactions. Give your health care provider a list of all the medicines, herbs, non-prescription drugs, or dietary supplements you use. Also tell them if you smoke, drink alcohol, or use illegal drugs. Some items may interact with your medicine. What should I watch for while using this medication? Your condition will be monitored carefully while you are receiving this medication. You may need blood work while taking this medication. This medication may  make you feel generally unwell. This is not uncommon as chemotherapy can affect healthy cells as well as cancer cells. Report any side effects. Continue your course of treatment even though you feel ill unless your care team tells you to stop. This medication can cause serious allergic reactions. To reduce the risk, your care team may give you other medications to take before receiving this one. Be sure to follow the directions from your care team. This medication may increase your risk of getting an infection. Call your care team for advice if you get a fever, chills, sore throat, or other symptoms of a cold or flu. Do not treat yourself. Try to avoid being around people who are sick. This medication may increase your  risk to bruise or bleed. Call your care team if you notice any unusual bleeding. Be careful brushing or flossing your teeth or using a toothpick because you may get an infection or bleed more easily. If you have any dental work done, tell your dentist you are receiving this medication. Talk to your care team if you may be pregnant. Serious birth defects can occur if you take this medication during pregnancy. Talk to your care team before breastfeeding. Changes to your treatment plan may be needed. What side effects may I notice from receiving this medication? Side effects that you should report to your care team as soon as possible: Allergic reactions--skin rash, itching, hives, swelling of the face, lips, tongue, or throat Heart rhythm changes--fast or irregular heartbeat, dizziness, feeling faint or lightheaded, chest pain, trouble breathing Increase in blood pressure Infection--fever, chills, cough, sore throat, wounds that don't heal, pain or trouble when passing urine, general feeling of discomfort or being unwell Low blood pressure--dizziness, feeling faint or lightheaded, blurry vision Low red blood cell level--unusual weakness or fatigue, dizziness, headache, trouble breathing Painful swelling, warmth, or redness of the skin, blisters or sores at the infusion site Pain, tingling, or numbness in the hands or feet Slow heartbeat--dizziness, feeling faint or lightheaded, confusion, trouble breathing, unusual weakness or fatigue Unusual bruising or bleeding Side effects that usually do not require medical attention (report to your care team if they continue or are bothersome): Diarrhea Hair loss Joint pain Loss of appetite Muscle pain Nausea Vomiting This list may not describe all possible side effects. Call your doctor for medical advice about side effects. You may report side effects to FDA at 1-800-FDA-1088. Where should I keep my medication? This medication is given in a hospital or  clinic. It will not be stored at home. NOTE: This sheet is a summary. It may not cover all possible information. If you have questions about this medicine, talk to your doctor, pharmacist, or health care provider.  2023 Elsevier/Gold Standard (2021-08-23 00:00:00) Carboplatin Injection What is this medication? CARBOPLATIN (KAR boe pla tin) treats some types of cancer. It works by slowing down the growth of cancer cells. This medicine may be used for other purposes; ask your health care provider or pharmacist if you have questions. COMMON BRAND NAME(S): Paraplatin What should I tell my care team before I take this medication? They need to know if you have any of these conditions: Blood disorders Hearing problems Kidney disease Recent or ongoing radiation therapy An unusual or allergic reaction to carboplatin, cisplatin, other medications, foods, dyes, or preservatives Pregnant or trying to get pregnant Breast-feeding How should I use this medication? This medication is injected into a vein. It is given by your care team in a hospital  or clinic setting. Talk to your care team about the use of this medication in children. Special care may be needed. Overdosage: If you think you have taken too much of this medicine contact a poison control center or emergency room at once. NOTE: This medicine is only for you. Do not share this medicine with others. What if I miss a dose? Keep appointments for follow-up doses. It is important not to miss your dose. Call your care team if you are unable to keep an appointment. What may interact with this medication? Medications for seizures Some antibiotics, such as amikacin, gentamicin, neomycin, streptomycin, tobramycin Vaccines This list may not describe all possible interactions. Give your health care provider a list of all the medicines, herbs, non-prescription drugs, or dietary supplements you use. Also tell them if you smoke, drink alcohol, or use  illegal drugs. Some items may interact with your medicine. What should I watch for while using this medication? Your condition will be monitored carefully while you are receiving this medication. You may need blood work while taking this medication. This medication may make you feel generally unwell. This is not uncommon, as chemotherapy can affect healthy cells as well as cancer cells. Report any side effects. Continue your course of treatment even though you feel ill unless your care team tells you to stop. In some cases, you may be given additional medications to help with side effects. Follow all directions for their use. This medication may increase your risk of getting an infection. Call your care team for advice if you get a fever, chills, sore throat, or other symptoms of a cold or flu. Do not treat yourself. Try to avoid being around people who are sick. Avoid taking medications that contain aspirin, acetaminophen, ibuprofen, naproxen, or ketoprofen unless instructed by your care team. These medications may hide a fever. Be careful brushing or flossing your teeth or using a toothpick because you may get an infection or bleed more easily. If you have any dental work done, tell your dentist you are receiving this medication. Talk to your care team if you wish to become pregnant or think you might be pregnant. This medication can cause serious birth defects. Talk to your care team about effective forms of contraception. Do not breast-feed while taking this medication. What side effects may I notice from receiving this medication? Side effects that you should report to your care team as soon as possible: Allergic reactions--skin rash, itching, hives, swelling of the face, lips, tongue, or throat Infection--fever, chills, cough, sore throat, wounds that don't heal, pain or trouble when passing urine, general feeling of discomfort or being unwell Low red blood cell level--unusual weakness or  fatigue, dizziness, headache, trouble breathing Pain, tingling, or numbness in the hands or feet, muscle weakness, change in vision, confusion or trouble speaking, loss of balance or coordination, trouble walking, seizures Unusual bruising or bleeding Side effects that usually do not require medical attention (report to your care team if they continue or are bothersome): Hair loss Nausea Unusual weakness or fatigue Vomiting This list may not describe all possible side effects. Call your doctor for medical advice about side effects. You may report side effects to FDA at 1-800-FDA-1088. Where should I keep my medication? This medication is given in a hospital or clinic. It will not be stored at home. NOTE: This sheet is a summary. It may not cover all possible information. If you have questions about this medicine, talk to your doctor, pharmacist, or health  care provider.  2023 Elsevier/Gold Standard (2021-08-07 00:00:00)

## 2022-04-03 ENCOUNTER — Telehealth: Payer: Self-pay

## 2022-04-03 LAB — T4: T4, Total: 10.2 ug/dL (ref 4.5–12.0)

## 2022-04-03 NOTE — Telephone Encounter (Signed)
-----   Message from Rafael Bihari, RN sent at 04/02/2022  3:41 PM EST ----- Regarding: Dr Burr Medico pt, first time Keytruda/Paclitaxel/Carboplatin Pt arrived 11/27 for first time Keytruda/Paclitaxel/Carboplatin. Tolerated infusions well. Needs call back.

## 2022-04-03 NOTE — Telephone Encounter (Signed)
LM for patient that this nurse was calling to see how they were doing after their treatment. Please call back to Dr.  Feng's nurse at [phone] if they have any questions or concerns regarding the treatment. 

## 2022-04-04 ENCOUNTER — Telehealth: Payer: Self-pay | Admitting: *Deleted

## 2022-04-04 NOTE — Telephone Encounter (Signed)
Message left for Marilyn Ford ,[phone] (home) requesting return call to this nurse.  [employer] claim form completed received today signed by provider.  Last day of work clarification needed.  This nurse used first consultation date 03/08/2022.

## 2022-04-05 NOTE — Telephone Encounter (Addendum)
Voicemail received from ELLYANA CRIGLER [phone] (home) reporting her last day of work was March 07, 2022.    Message left thanking for above information.  Form may be faxed to [employer].  Form with pathology report mailed to address on file per her request to [place] [id number].  Next Appointment in [facility] 05/21/2022.  May call this nurse with further questions or needs.  Copy to H.I.M. bin designated for items to be scanned.  No further instructions or activity required or performed by this nurse.

## 2022-04-06 ENCOUNTER — Other Ambulatory Visit: Payer: Self-pay

## 2022-04-06 DIAGNOSIS — Z171 Estrogen receptor negative status [ER-]: Secondary | ICD-10-CM

## 2022-04-09 ENCOUNTER — Inpatient Hospital Stay: Payer: No Typology Code available for payment source | Attending: Hematology and Oncology

## 2022-04-09 ENCOUNTER — Inpatient Hospital Stay (HOSPITAL_BASED_OUTPATIENT_CLINIC_OR_DEPARTMENT_OTHER): Payer: No Typology Code available for payment source | Admitting: Hematology

## 2022-04-09 ENCOUNTER — Inpatient Hospital Stay: Payer: No Typology Code available for payment source

## 2022-04-09 VITALS — BP 137/87 | HR 86 | Temp 97.5°F | Resp 20

## 2022-04-09 DIAGNOSIS — Z5189 Encounter for other specified aftercare: Secondary | ICD-10-CM | POA: Insufficient documentation

## 2022-04-09 DIAGNOSIS — Z171 Estrogen receptor negative status [ER-]: Secondary | ICD-10-CM | POA: Diagnosis not present

## 2022-04-09 DIAGNOSIS — C50811 Malignant neoplasm of overlapping sites of right female breast: Secondary | ICD-10-CM | POA: Diagnosis not present

## 2022-04-09 DIAGNOSIS — Z5111 Encounter for antineoplastic chemotherapy: Secondary | ICD-10-CM | POA: Diagnosis not present

## 2022-04-09 DIAGNOSIS — Z5112 Encounter for antineoplastic immunotherapy: Secondary | ICD-10-CM | POA: Insufficient documentation

## 2022-04-09 LAB — COMPREHENSIVE METABOLIC PANEL
ALT: 68 U/L — ABNORMAL HIGH (ref 0–44)
AST: 43 U/L — ABNORMAL HIGH (ref 15–41)
Albumin: 3.8 g/dL (ref 3.5–5.0)
Alkaline Phosphatase: 101 U/L (ref 38–126)
Anion gap: 9 (ref 5–15)
BUN: 15 mg/dL (ref 8–23)
CO2: 27 mmol/L (ref 22–32)
Calcium: 10 mg/dL (ref 8.9–10.3)
Chloride: 99 mmol/L (ref 98–111)
Creatinine, Ser: 0.83 mg/dL (ref 0.44–1.00)
GFR, Estimated: >60 mL/min (ref >60)
Glucose, Bld: 139 mg/dL — ABNORMAL HIGH (ref 70–99)
Potassium: 3.9 mmol/L (ref 3.5–5.1)
Sodium: 135 mmol/L (ref 135–145)
Total Bilirubin: 0.6 mg/dL (ref 0.3–1.2)
Total Protein: 7.2 g/dL (ref 6.5–8.1)

## 2022-04-09 LAB — CBC WITH DIFFERENTIAL/PLATELET
Abs Immature Granulocytes: 0.01 10*3/uL (ref 0.00–0.07)
Basophils Absolute: 0.1 10*3/uL (ref 0.0–0.1)
Basophils Relative: 2 %
Eosinophils Absolute: 0.2 10*3/uL (ref 0.0–0.5)
Eosinophils Relative: 5 %
HCT: 36.3 % (ref 36.0–46.0)
Hemoglobin: 12.6 g/dL (ref 12.0–15.0)
Immature Granulocytes: 0 %
Lymphocytes Relative: 31 %
Lymphs Abs: 1.2 10*3/uL (ref 0.7–4.0)
MCH: 30.3 pg (ref 26.0–34.0)
MCHC: 34.7 g/dL (ref 30.0–36.0)
MCV: 87.3 fL (ref 80.0–100.0)
Monocytes Absolute: 0.2 10*3/uL (ref 0.1–1.0)
Monocytes Relative: 6 %
Neutro Abs: 2.3 10*3/uL (ref 1.7–7.7)
Neutrophils Relative %: 56 %
Platelets: 244 10*3/uL (ref 150–400)
RBC: 4.16 MIL/uL (ref 3.87–5.11)
RDW: 12.8 % (ref 11.5–15.5)
WBC: 4 10*3/uL (ref 4.0–10.5)
nRBC: 0 % (ref 0.0–0.2)

## 2022-04-09 LAB — MAGNESIUM: Magnesium: 1.4 mg/dL — ABNORMAL LOW (ref 1.7–2.4)

## 2022-04-09 MED ORDER — FAMOTIDINE IN NACL 20-0.9 MG/50ML-% IV SOLN
20.0000 mg | Freq: Once | INTRAVENOUS | Status: AC
  Administered 2022-04-09: 20 mg via INTRAVENOUS
  Filled 2022-04-09: qty 50

## 2022-04-09 MED ORDER — MAGNESIUM SULFATE 2 GM/50ML IV SOLN
2.0000 g | Freq: Once | INTRAVENOUS | Status: AC
  Administered 2022-04-09: 2 g via INTRAVENOUS
  Filled 2022-04-09: qty 50

## 2022-04-09 MED ORDER — SODIUM CHLORIDE 0.9% FLUSH
10.0000 mL | INTRAVENOUS | Status: DC | PRN
  Administered 2022-04-09: 10 mL

## 2022-04-09 MED ORDER — MAGNESIUM OXIDE -MG SUPPLEMENT 400 (240 MG) MG PO TABS: 400.0000 mg | ORAL_TABLET | Freq: Two times a day (BID) | ORAL | 1 refills | Status: DC

## 2022-04-09 MED ORDER — SODIUM CHLORIDE 0.9 % IV SOLN
80.0000 mg/m2 | Freq: Once | INTRAVENOUS | Status: AC
  Administered 2022-04-09: 150 mg via INTRAVENOUS
  Filled 2022-04-09: qty 25

## 2022-04-09 MED ORDER — DIPHENHYDRAMINE HCL 50 MG/ML IJ SOLN
25.0000 mg | Freq: Once | INTRAMUSCULAR | Status: AC
  Administered 2022-04-09: 25 mg via INTRAVENOUS
  Filled 2022-04-09: qty 1

## 2022-04-09 MED ORDER — SODIUM CHLORIDE 0.9 % IV SOLN: Freq: Once | INTRAVENOUS | Status: AC

## 2022-04-09 MED ORDER — HEPARIN SOD (PORK) LOCK FLUSH 100 UNIT/ML IV SOLN
500.0000 [IU] | Freq: Once | INTRAVENOUS | Status: AC | PRN
  Administered 2022-04-09: 500 [IU]

## 2022-04-09 NOTE — Progress Notes (Signed)
Patient has been assessed, vital signs and labs have been reviewed by Dr. Katragadda. ANC, Creatinine, LFTs, and Platelets are within treatment parameters per Dr. Katragadda. The patient is good to proceed with treatment at this time. Primary RN and pharmacy aware.  

## 2022-04-09 NOTE — Progress Notes (Signed)
Patient tolerated chemotherapy and 4g of magnesium with no complaints voiced. Side effects with management reviewed understanding verbalized. Port site clean and dry with no bruising or swelling noted at site. Good blood return noted before and after administration of chemotherapy. Band aid applied. Patient left in satisfactory condition with VSS and no s/s of distress noted.

## 2022-04-09 NOTE — Patient Instructions (Signed)
[facility]  Discharge Instructions: Thank you for choosing [facility] to provide your oncology and hematology care.  If you have a lab appointment with the [facility], please come in thru the Main Entrance and check in at the main information desk.  Wear comfortable clothing and clothing appropriate for easy access to any Portacath or PICC line.   We strive to give you quality time with your provider. You may need to reschedule your appointment if you arrive late (15 or more minutes).  Arriving late affects you and other patients whose appointments are after yours.  Also, if you miss three or more appointments without notifying the office, you may be dismissed from the clinic at the provider's discretion.      For prescription refill requests, have your pharmacy contact our office and allow 72 hours for refills to be completed.    Today you received the following chemotherapy and/or immunotherapy agents Taxol, and 4g of magnesium. Return as scheduled.   To help prevent nausea and vomiting after your treatment, we encourage you to take your nausea medication as directed.  BELOW ARE SYMPTOMS THAT SHOULD BE REPORTED IMMEDIATELY: *FEVER GREATER THAN 100.4 F (38 C) OR HIGHER *CHILLS OR SWEATING *NAUSEA AND VOMITING THAT IS NOT CONTROLLED WITH YOUR NAUSEA MEDICATION *UNUSUAL SHORTNESS OF BREATH *UNUSUAL BRUISING OR BLEEDING *URINARY PROBLEMS (pain or burning when urinating, or frequent urination) *BOWEL PROBLEMS (unusual diarrhea, constipation, pain near the anus) TENDERNESS IN MOUTH AND THROAT WITH OR WITHOUT PRESENCE OF ULCERS (sore throat, sores in mouth, or a toothache) UNUSUAL RASH, SWELLING OR PAIN  UNUSUAL VAGINAL DISCHARGE OR ITCHING   Items with * indicate a potential emergency and should be followed up as soon as possible or go to the Emergency Department if any problems should occur.  Please show the CHEMOTHERAPY ALERT CARD or IMMUNOTHERAPY  ALERT CARD at check-in to the Emergency Department and triage nurse.  Should you have questions after your visit or need to cancel or reschedule your appointment, please contact [facility] [phone]  and follow the prompts.  Office hours are 8:00 a.m. to 4:30 p.m. Monday - Friday. Please note that voicemails left after 4:00 p.m. may not be returned until the following business day.  We are closed weekends and major holidays. You have access to a nurse at all times for urgent questions. Please call the main number to the clinic [phone] and follow the prompts.  For any non-urgent questions, you may also contact your provider using MyChart. We now offer e-Visits for anyone 45 and older to request care online for non-urgent symptoms. For details visit [email or web address].   Also download the MyChart app! Go to the app store, search "MyChart", open the app, select [facility], and log in with your MyChart username and password.  Masks are optional in the cancer centers. If you would like for your care team to wear a mask while they are taking care of you, please let them know. You may have one support person who is at least 61 years old accompany you for your appointments.

## 2022-04-09 NOTE — Progress Notes (Signed)
Patients port flushed without difficulty.  Good blood return noted with no bruising or swelling noted at site.  Stable during access and blood draw.  Patient to remain accessed for treatment. 

## 2022-04-09 NOTE — Progress Notes (Signed)
[facility] NOTE  Patient Care Team: Claretta Fraise, MD as PCP - General (Family Medicine) Claretta Fraise, MD (Family Medicine) Lavera Guise, [facility] as Pharmacist (Family Medicine) Montez Morita, Quillian Quince, MD as Consulting Physician (Gastroenterology) Aviva Signs, MD as Consulting Physician (General Surgery) Mauro Kaufmann, RN as Oncology Nurse Navigator Rockwell Germany, RN as Oncology Nurse Navigator Truitt Merle, MD as Consulting Physician (Hematology) Derek Jack, MD as Medical Oncologist (Medical Oncology) Brien Mates, RN as Oncology Nurse Navigator (Medical Oncology)  CHIEF COMPLAINTS/PURPOSE OF CONSULTATION:  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  HISTORY OF PRESENTING ILLNESS:  Marilyn Ford 61 y.o. female is seen at the request of Dr. Burr Medico for inflammatory right breast cancer.  She had a bilateral mammogram which showed suspicious right axillary lymphadenopathy.  There was also new right breast skin and trabecular thickening.  She underwent right axillary lymph node biopsy which was consistent with breast cancer, triple receptor negative.  This was followed by MRI of the breast which showed patchy clumped non-mass enhancement in the upper outer quadrant of the right breast spanning 3.8 cm.  Diffuse thickening of the skin in the right breast with skin enhancement.  Upper outer quadrant right breast biopsy was negative for carcinoma.  PET scan showed bulky hypermetabolic right axillary and subpectoral lymphadenopathy.  No other definitive new sites of metastatic disease.  She was started on carboplatin, paclitaxel and pembrolizumab on 04/02/2022. She had 5 previous biopsies on the right breast all benign.  She attained menarche at age 58.  Menopause in 2015.  She has 2 children.  Age at first childbirth age 65.  She was never on OCPs or HRT.  MEDICAL HISTORY:  Past Medical History:  Diagnosis Date   Allergy    Anxiety     Breast cancer ([place])    Diabetes mellitus without complication ([place])    GERD (gastroesophageal reflux disease)    Heart murmur    as a child   History of kidney stones    Hypertension    Pneumonia    Stroke ([place])    Vaginal Pap smear, abnormal     SURGICAL HISTORY: Past Surgical History:  Procedure Laterality Date   CESAREAN SECTION     CHOLECYSTECTOMY     COLONOSCOPY WITH PROPOFOL N/A 07/19/2020   Procedure: COLONOSCOPY WITH PROPOFOL;  Surgeon: Harvel Quale, MD;  Location: [facility];  Service: Gastroenterology;  Laterality: N/A;  AM   POLYPECTOMY  07/19/2020   Procedure: POLYPECTOMY;  Surgeon: Montez Morita, Quillian Quince, MD;  Location: [facility];  Service: Gastroenterology;;   PORTACATH PLACEMENT N/A 03/20/2022   Procedure: INSERTION PORT-A-CATH;  Surgeon: Coralie Keens, MD;  Location: [facility];  Service: General;  Laterality: N/A;    SOCIAL HISTORY: Social History   Socioeconomic History   Marital status: Married    Spouse name: Jeneen Rinks   Number of children: 2   Years of education: 10   Highest education level: 10th grade  Occupational History   Occupation: disabled  Tobacco Use   Smoking status: Former    Packs/day: 0.50    Years: 25.00    Total pack years: 12.50    Types: Cigarettes    Start date: 12/08/1999    Quit date: 03/12/2015    Years since quitting: 7.1   Smokeless tobacco: Never  Vaping Use   Vaping Use: Never used  Substance and Sexual Activity   Alcohol use: Yes    Alcohol/week: 2.0 - 3.0  standard drinks of alcohol    Types: 2 - 3 Glasses of wine per week   Drug use: No   Sexual activity: Not Currently    Birth control/protection: Abstinence, Post-menopausal  Other Topics Concern   Not on file  Social History Narrative   Volunteers at [employer] for a few hours every day.    She really enjoys getting out of of the house and working there.    Social Determinants of Health   Financial Resource Strain: Low Risk   (03/01/2022)   Overall Financial Resource Strain (CARDIA)    Difficulty of Paying Living Expenses: Not very hard  Food Insecurity: No Food Insecurity (04/09/2022)   Hunger Vital Sign    Worried About Running Out of Food in the Last Year: Never true    Ran Out of Food in the Last Year: Never true  Transportation Needs: No Transportation Needs (03/01/2022)   PRAPARE - Hydrologist (Medical): No    Lack of Transportation (Non-Medical): No  Physical Activity: Insufficiently Active (03/01/2022)   Exercise Vital Sign    Days of Exercise per Week: 1 day    Minutes of Exercise per Session: 10 min  Stress: Stress Concern Present (03/01/2022)   [place]    Feeling of Stress : Rather much  Social Connections: Moderately Isolated (03/01/2022)   Social Connection and Isolation Panel [NHANES]    Frequency of Communication with Friends and Family: Twice a week    Frequency of Social Gatherings with Friends and Family: Twice a week    Attends Religious Services: Never    Marine scientist or Organizations: No    Attends Archivist Meetings: Never    Marital Status: Married  Human resources officer Violence: Not At Risk (04/09/2022)   Humiliation, Afraid, Rape, and Kick questionnaire    Fear of Current or Ex-Partner: No    Emotionally Abused: No    Physically Abused: No    Sexually Abused: No    FAMILY HISTORY: Family History  Problem Relation Age of Onset   Diabetes Mother    Cervical cancer Mother    Diabetes Brother    Breast cancer Maternal Aunt     ALLERGIES:  is allergic to penicillins and sulfa antibiotics.  MEDICATIONS:  Current Outpatient Medications  Medication Sig Dispense Refill   Alcohol Swabs (B-D SINGLE USE SWABS REGULAR) PADS Test BS daily and as needed Dx E11.9 100 each 3   amLODipine (NORVASC) 10 MG tablet 1 tablet daily 90 tablet 3   Apple Cider Vinegar 500 MG TABS  Take 500 mg by mouth in the morning.     aspirin 81 MG EC tablet Take 1 tablet (81 mg total) by mouth daily. 30 tablet 0   atorvastatin (LIPITOR) 80 MG tablet TAKE 1 TABLET EVERY DAY AT 6PM 90 tablet 3   Blood Glucose Calibration (TRUE METRIX LEVEL 1) Low SOLN Use with glucometer Dx E11.9 3 each 0   Blood Glucose Monitoring Suppl (TRUE METRIX AIR GLUCOSE METER) w/Device KIT Test BS daily and as needed Dx E11.9 1 kit 0   Calcium Carb-Cholecalciferol (CALCIUM-VITAMIN D) 600-400 MG-UNIT TABS Take 1 tablet by mouth in the morning.     Coenzyme Q10 (COQ10) 100 MG CAPS Take 100 mg by mouth in the morning.     Cranberry 425 MG CAPS Take 425 mg by mouth in the morning and at bedtime.  Dulaglutide (TRULICITY) 4.5 RJ/1.8AC SOPN Inject 4.5 mg as directed once a week. (Patient taking differently: Inject 4.5 mg as directed every Saturday.) 6 mL 3   Flaxseed, Linseed, (FLAXSEED OIL) 1000 MG CAPS Take 1,000 mg by mouth in the morning.     glucose blood (TRUE METRIX BLOOD GLUCOSE TEST) test strip Test BS daily and as needed Dx E11.9 100 each 3   Krill Oil 500 MG CAPS Take 3 capsules (1,500 mg total) by mouth in the morning and at bedtime.     lidocaine-prilocaine (EMLA) cream Apply to affected area once 30 g 3   metoprolol (TOPROL-XL) 200 MG 24 hr tablet TAKE 1 TABLET ONE TIME DAILY, WITH OR IMMEDIATELY FOLLOWING A MEAL 90 tablet 3   Multiple Vitamin (MULTIVITAMIN) capsule Take 1 capsule by mouth in the morning.     ondansetron (ZOFRAN) 8 MG tablet Take 1 tablet (8 mg total) by mouth every 8 (eight) hours as needed for nausea or vomiting. Start on the third day after chemotherapy. 30 tablet 1   pantoprazole (PROTONIX) 40 MG tablet TAKE 1 TABLET twice daily FOR STOMACH 180 tablet 3   prochlorperazine (COMPAZINE) 10 MG tablet Take 1 tablet (10 mg total) by mouth every 6 (six) hours as needed for nausea or vomiting. 30 tablet 1   traMADol (ULTRAM) 50 MG tablet Take 1 tablet (50 mg total) by mouth every 6 (six)  hours as needed for moderate pain or severe pain. 20 tablet 0   traZODone (DESYREL) 150 MG tablet TAKE 1 OR 2 TABLETS AT BEDTIME FOR SLEEP (Patient taking differently: Take 75 mg by mouth at bedtime.) 180 tablet 3   TRUEplus Lancets 33G MISC Test BS daily and as needed Dx E11.9 100 each 3   valsartan (DIOVAN) 320 MG tablet TAKE 1 TABLET (320 MG TOTAL) BY MOUTH DAILY. FOR BLOOD PRESSURE. 90 tablet 1   zinc gluconate 50 MG tablet Take 50 mg by mouth daily.     clopidogrel (PLAVIX) 75 MG tablet Take 75 mg by mouth daily.     icosapent Ethyl (VASCEPA) 1 g capsule Take 2 g by mouth 2 (two) times daily.     LORazepam (ATIVAN) 1 MG tablet Take 1 mg by mouth every 8 (eight) hours as needed.     magnesium oxide (MAG-OX) 400 (240 Mg) MG tablet Take 1 tablet (400 mg total) by mouth in the morning, at noon, and at bedtime. 90 tablet 2   No current facility-administered medications for this visit.    REVIEW OF SYSTEMS:   Constitutional: Denies fevers, chills or abnormal night sweats Eyes: Denies blurriness of vision, double vision or watery eyes Ears, nose, mouth, throat, and face: Denies mucositis or sore throat Respiratory: Denies cough, dyspnea or wheezes Cardiovascular: Denies palpitation, chest discomfort or lower extremity swelling Gastrointestinal:  Denies nausea, heartburn or change in bowel habits Skin: Denies abnormal skin rashes Lymphatics: Denies new lymphadenopathy or easy bruising Neurological:Denies numbness, tingling or new weaknesses Behavioral/Psych: Mood is stable, no new changes  All other systems were reviewed with the patient and are negative.  PHYSICAL EXAMINATION: ECOG PERFORMANCE STATUS: 1 - Symptomatic but completely ambulatory  There were no vitals filed for this visit. There were no vitals filed for this visit.  GENERAL:alert, no distress and comfortable SKIN: skin color, texture, turgor are normal, no rashes or significant lesions EYES: normal, conjunctiva are pink  and non-injected, sclera clear OROPHARYNX:no exudate, no erythema and lips, buccal mucosa, and tongue normal  NECK: supple, thyroid  normal size, non-tender, without nodularity LYMPH:  no palpable lymphadenopathy in the cervical, axillary or inguinal LUNGS: clear to auscultation and percussion with normal breathing effort HEART: regular rate & rhythm and no murmurs and no lower extremity edema ABDOMEN:abdomen soft, non-tender and normal bowel sounds Musculoskeletal:no cyanosis of digits and no clubbing  PSYCH: alert & oriented x 3 with fluent speech NEURO: no focal motor/sensory deficits Breast exam: Right breast skin thickening with erythema.  Orange peel appearance present.  LABORATORY DATA:  I have reviewed the data as listed Lab Results  Component Value Date   WBC 3.0 (L) 05/01/2022   HGB 9.6 (L) 05/01/2022   HCT 27.8 (L) 05/01/2022   MCV 89.1 05/01/2022   PLT 145 (L) 05/01/2022     Chemistry      Component Value Date/Time   NA 139 05/01/2022 0810   NA 141 03/07/2022 0942   K 4.2 05/01/2022 0810   CL 105 05/01/2022 0810   CO2 28 05/01/2022 0810   BUN 20 05/01/2022 0810   BUN 14 03/07/2022 0942   CREATININE 0.92 05/01/2022 0810   CREATININE 0.97 04/02/2022 0924      Component Value Date/Time   CALCIUM 9.6 05/01/2022 0810   ALKPHOS 110 05/01/2022 0810   AST 29 05/01/2022 0810   AST 23 04/02/2022 0924   ALT 36 05/01/2022 0810   ALT 26 04/02/2022 0924   BILITOT 0.6 05/01/2022 0810   BILITOT 0.6 04/02/2022 0924       RADIOGRAPHIC STUDIES: I have personally reviewed the radiological images as listed and agreed with the findings in the report. No results found.  ASSESSMENT:  1.  Inflammatory right breast cancer: - Bilateral diagnostic mammogram (02/20/2022): Suspicious right axillary lymphadenopathy.  Indeterminate intramammary lymph node in the right breast at 9:00.  New right breast skin and trabecular thickening, related to vascular congestion from enlarged lymph  nodes in the right axilla. - Right axillary lymph node core biopsy (02/20/2022): Morphology and IHC compatible with primary breast cancer with differential diagnosis of urothelial carcinoma and primary lung carcinoma (squamous).  Grade 3.  ER 0%, PR 0%, HER2 2+, Ki-67 60%, HER2 negative by FISH. - Right breast lymph node biopsy at 9:00 (02/20/2022): Negative for carcinoma. - MRI breast (03/02/2022): In the upper outer right breast, mid to posterior depth there is a patchy clumped non-mass enhancement in a linear orientation spanning approximately 3.8 cm.  There is diffuse thickening of the skin in the right breast with skin enhancement.  Left breast with no mass or abnormal enhancement.  Numerous bulky matted lymph nodes in the right axilla. - Right breast UOQ biopsy (03/19/2022): Benign breast with fibrocystic changes including stromal fibrosis, adenosis, usual ductal hyperplasia.  Negative for carcinoma. - PET scan (03/22/2022): Bulky hypermetabolic right axillary and subpectoral lymphadenopathy.  No other definite new sites of metastatic disease.  6 mm right lower lobe lung nodule with no FDG uptake. - Cycle 1 of carboplatin, paclitaxel and pembrolizumab started on 04/02/2022.  2.  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 She quit smoking in 2015.  She smoked 1 pack/day for 30 years. - Maternal aunt died of breast cancer.  Mother had uterine cancer.   PLAN:  1.  Triple negative right breast cancer: - She had diarrhea for couple of days and took Imodium as needed which helped.  She developed constipation and took prune  juice.  Denied any tingling or numbness in the extremities. - She has a genetics appointment on 05/21/2022. - I have reviewed her labs which showed AST 43 and ALT of 68.  CBC was grossly normal. - She will proceed with her weekly paclitaxel today. - RTC day 1 cycle 2 with  labs.  2.  Hypomagnesemia: - Continue magnesium twice daily.   No orders of the defined types were placed in this encounter.   All questions were answered. The patient knows to call the clinic with any problems, questions or concerns.      Derek Jack, MD 05/04/2022 2:08 PM

## 2022-04-09 NOTE — Patient Instructions (Addendum)
[facility]  Discharge Instructions  You were seen and examined today by Dr. Delton Coombes. Dr. Delton Coombes is a medical oncologist, meaning that he specializes in the treatment of cancer diagnoses. Dr. Delton Coombes discussed your past medical history, family history of cancers, and the events that led to you being here today.  You are here to establish care with Dr. Delton Coombes for ongoing treatment of your breast cancer.  You will receive your Taxol today as previously planned.  Follow-up as scheduled.  Thank you for choosing [facility] to provide your oncology and hematology care.   To afford each patient quality time with our provider, please arrive at least 15 minutes before your scheduled appointment time. You may need to reschedule your appointment if you arrive late (10 or more minutes). Arriving late affects you and other patients whose appointments are after yours.  Also, if you miss three or more appointments without notifying the office, you may be dismissed from the clinic at the provider's discretion.    Again, thank you for choosing [facility].  Our hope is that these requests will decrease the amount of time that you wait before being seen by our physicians.   If you have a lab appointment with the [facility] please come in thru the Main Entrance and check in at the main information desk.           _____________________________________________________________  Should you have questions after your visit to [facility], please contact our office at [phone] and follow the prompts.  Our office hours are 8:00 a.m. to 4:30 p.m. Monday - Thursday and 8:00 a.m. to 2:30 p.m. Friday.  Please note that voicemails left after 4:00 p.m. may not be returned until the following business day.  We are closed weekends and all major holidays.  You do have access to a nurse 24-7, just call the main number to the  clinic [phone] and do not press any options, hold on the line and a nurse will answer the phone.    For prescription refill requests, have your pharmacy contact our office and allow 72 hours.    Masks are optional in the cancer centers. If you would like for your care team to wear a mask while they are taking care of you, please let them know. You may have one support person who is at least 61 years old accompany you for your appointments.

## 2022-04-11 ENCOUNTER — Other Ambulatory Visit: Payer: Self-pay

## 2022-04-11 DIAGNOSIS — Z171 Estrogen receptor negative status [ER-]: Secondary | ICD-10-CM

## 2022-04-16 ENCOUNTER — Inpatient Hospital Stay: Payer: No Typology Code available for payment source

## 2022-04-16 VITALS — BP 123/73 | HR 89 | Temp 97.1°F | Resp 18

## 2022-04-16 DIAGNOSIS — C50811 Malignant neoplasm of overlapping sites of right female breast: Secondary | ICD-10-CM

## 2022-04-16 DIAGNOSIS — Z5112 Encounter for antineoplastic immunotherapy: Secondary | ICD-10-CM | POA: Diagnosis not present

## 2022-04-16 LAB — CBC WITH DIFFERENTIAL/PLATELET
Abs Immature Granulocytes: 0 10*3/uL (ref 0.00–0.07)
Basophils Absolute: 0 10*3/uL (ref 0.0–0.1)
Basophils Relative: 1 %
Eosinophils Absolute: 0.2 10*3/uL (ref 0.0–0.5)
Eosinophils Relative: 8 %
HCT: 32.8 % — ABNORMAL LOW (ref 36.0–46.0)
Hemoglobin: 11.3 g/dL — ABNORMAL LOW (ref 12.0–15.0)
Immature Granulocytes: 0 %
Lymphocytes Relative: 46 %
Lymphs Abs: 1.4 10*3/uL (ref 0.7–4.0)
MCH: 30.2 pg (ref 26.0–34.0)
MCHC: 34.5 g/dL (ref 30.0–36.0)
MCV: 87.7 fL (ref 80.0–100.0)
Monocytes Absolute: 0.3 10*3/uL (ref 0.1–1.0)
Monocytes Relative: 10 %
Neutro Abs: 1 10*3/uL — ABNORMAL LOW (ref 1.7–7.7)
Neutrophils Relative %: 35 %
Platelets: 202 10*3/uL (ref 150–400)
RBC: 3.74 MIL/uL — ABNORMAL LOW (ref 3.87–5.11)
RDW: 13.2 % (ref 11.5–15.5)
WBC: 3 10*3/uL — ABNORMAL LOW (ref 4.0–10.5)
nRBC: 0 % (ref 0.0–0.2)

## 2022-04-16 LAB — COMPREHENSIVE METABOLIC PANEL
ALT: 48 U/L — ABNORMAL HIGH (ref 0–44)
AST: 33 U/L (ref 15–41)
Albumin: 3.8 g/dL (ref 3.5–5.0)
Alkaline Phosphatase: 89 U/L (ref 38–126)
Anion gap: 7 (ref 5–15)
BUN: 15 mg/dL (ref 8–23)
CO2: 25 mmol/L (ref 22–32)
Calcium: 8.8 mg/dL — ABNORMAL LOW (ref 8.9–10.3)
Chloride: 106 mmol/L (ref 98–111)
Creatinine, Ser: 0.83 mg/dL (ref 0.44–1.00)
GFR, Estimated: >60 mL/min (ref >60)
Glucose, Bld: 138 mg/dL — ABNORMAL HIGH (ref 70–99)
Potassium: 4.2 mmol/L (ref 3.5–5.1)
Sodium: 138 mmol/L (ref 135–145)
Total Bilirubin: 0.5 mg/dL (ref 0.3–1.2)
Total Protein: 7.2 g/dL (ref 6.5–8.1)

## 2022-04-16 LAB — MAGNESIUM: Magnesium: 1.8 mg/dL (ref 1.7–2.4)

## 2022-04-16 MED ORDER — SODIUM CHLORIDE 0.9 % IV SOLN
80.0000 mg/m2 | Freq: Once | INTRAVENOUS | Status: AC
  Administered 2022-04-16: 150 mg via INTRAVENOUS
  Filled 2022-04-16: qty 25

## 2022-04-16 MED ORDER — SODIUM CHLORIDE 0.9% FLUSH
10.0000 mL | INTRAVENOUS | Status: DC | PRN
  Administered 2022-04-16: 10 mL

## 2022-04-16 MED ORDER — HEPARIN SOD (PORK) LOCK FLUSH 100 UNIT/ML IV SOLN
500.0000 [IU] | Freq: Once | INTRAVENOUS | Status: AC | PRN
  Administered 2022-04-16: 500 [IU]

## 2022-04-16 MED ORDER — SODIUM CHLORIDE 0.9% FLUSH
10.0000 mL | INTRAVENOUS | Status: DC | PRN
  Administered 2022-04-16: 10 mL via INTRAVENOUS

## 2022-04-16 MED ORDER — SODIUM CHLORIDE 0.9 % IV SOLN: Freq: Once | INTRAVENOUS | Status: AC

## 2022-04-16 MED ORDER — DIPHENHYDRAMINE HCL 50 MG/ML IJ SOLN
25.0000 mg | Freq: Once | INTRAMUSCULAR | Status: AC
  Administered 2022-04-16: 25 mg via INTRAVENOUS
  Filled 2022-04-16: qty 1

## 2022-04-16 MED ORDER — FAMOTIDINE IN NACL 20-0.9 MG/50ML-% IV SOLN
20.0000 mg | Freq: Once | INTRAVENOUS | Status: AC
  Administered 2022-04-16: 20 mg via INTRAVENOUS
  Filled 2022-04-16: qty 50

## 2022-04-16 NOTE — Patient Instructions (Signed)
[facility]  Discharge Instructions: Thank you for choosing [facility] to provide your oncology and hematology care.  If you have a lab appointment with the [facility], please come in thru the Main Entrance and check in at the main information desk.  Wear comfortable clothing and clothing appropriate for easy access to any Portacath or PICC line.   We strive to give you quality time with your provider. You may need to reschedule your appointment if you arrive late (15 or more minutes).  Arriving late affects you and other patients whose appointments are after yours.  Also, if you miss three or more appointments without notifying the office, you may be dismissed from the clinic at the provider's discretion.      For prescription refill requests, have your pharmacy contact our office and allow 72 hours for refills to be completed.    Today you received the following chemotherapy Taxol     To help prevent nausea and vomiting after your treatment, we encourage you to take your nausea medication as directed.  BELOW ARE SYMPTOMS THAT SHOULD BE REPORTED IMMEDIATELY: *FEVER GREATER THAN 100.4 F (38 C) OR HIGHER *CHILLS OR SWEATING *NAUSEA AND VOMITING THAT IS NOT CONTROLLED WITH YOUR NAUSEA MEDICATION *UNUSUAL SHORTNESS OF BREATH *UNUSUAL BRUISING OR BLEEDING *URINARY PROBLEMS (pain or burning when urinating, or frequent urination) *BOWEL PROBLEMS (unusual diarrhea, constipation, pain near the anus) TENDERNESS IN MOUTH AND THROAT WITH OR WITHOUT PRESENCE OF ULCERS (sore throat, sores in mouth, or a toothache) UNUSUAL RASH, SWELLING OR PAIN  UNUSUAL VAGINAL DISCHARGE OR ITCHING   Items with * indicate a potential emergency and should be followed up as soon as possible or go to the Emergency Department if any problems should occur.  Please show the CHEMOTHERAPY ALERT CARD or IMMUNOTHERAPY ALERT CARD at check-in to the Emergency Department and triage  nurse.  Should you have questions after your visit or need to cancel or reschedule your appointment, please contact [facility] [phone]  and follow the prompts.  Office hours are 8:00 a.m. to 4:30 p.m. Monday - Friday. Please note that voicemails left after 4:00 p.m. may not be returned until the following business day.  We are closed weekends and major holidays. You have access to a nurse at all times for urgent questions. Please call the main number to the clinic [phone] and follow the prompts.  For any non-urgent questions, you may also contact your provider using MyChart. We now offer e-Visits for anyone 24 and older to request care online for non-urgent symptoms. For details visit [email or web address].   Also download the MyChart app! Go to the app store, search "MyChart", open the app, select [facility], and log in with your MyChart username and password.  Masks are optional in the cancer centers. If you would like for your care team to wear a mask while they are taking care of you, please let them know. You may have one support person who is at least 61 years old accompany you for your appointments.

## 2022-04-16 NOTE — Progress Notes (Signed)
Patients port flushed without difficulty.  Good blood return noted with no bruising or swelling noted at site.  Patient remains accessed for chemotherapy treatment.  

## 2022-04-16 NOTE — Progress Notes (Signed)
Labs meet parameters today. Ok to proceed as planned. Patient is having some diarrhea but taking imodium which is helping.   Treatment given per orders. Patient tolerated it well without problems. Vitals stable and discharged home from clinic ambulatory. Follow up as scheduled.

## 2022-04-17 ENCOUNTER — Inpatient Hospital Stay: Payer: No Typology Code available for payment source

## 2022-04-17 ENCOUNTER — Encounter: Payer: Self-pay | Admitting: *Deleted

## 2022-04-17 VITALS — BP 153/86 | HR 90 | Temp 97.2°F | Resp 18

## 2022-04-17 DIAGNOSIS — Z171 Estrogen receptor negative status [ER-]: Secondary | ICD-10-CM

## 2022-04-17 DIAGNOSIS — Z5112 Encounter for antineoplastic immunotherapy: Secondary | ICD-10-CM | POA: Diagnosis not present

## 2022-04-17 MED ORDER — FILGRASTIM-AYOW 480 MCG/0.8ML ~~LOC~~ SOSY
480.0000 ug | PREFILLED_SYRINGE | Freq: Once | SUBCUTANEOUS | Status: AC
  Administered 2022-04-17: 480 ug via SUBCUTANEOUS
  Filled 2022-04-17: qty 0.8

## 2022-04-17 NOTE — Patient Instructions (Addendum)
[facility]  Discharge Instructions: Thank you for choosing [facility] to provide your oncology and hematology care.  If you have a lab appointment with the [facility], please come in thru the Main Entrance and check in at the main information desk.  Wear comfortable clothing and clothing appropriate for easy access to any Portacath or PICC line.   We strive to give you quality time with your provider. You may need to reschedule your appointment if you arrive late (15 or more minutes).  Arriving late affects you and other patients whose appointments are after yours.  Also, if you miss three or more appointments without notifying the office, you may be dismissed from the clinic at the provider's discretion.      For prescription refill requests, have your pharmacy contact our office and allow 72 hours for refills to be completed.    Today you received the following injection Releuko   To help prevent nausea and vomiting after your treatment, we encourage you to take your nausea medication as directed.  BELOW ARE SYMPTOMS THAT SHOULD BE REPORTED IMMEDIATELY: *FEVER GREATER THAN 100.4 F (38 C) OR HIGHER *CHILLS OR SWEATING *NAUSEA AND VOMITING THAT IS NOT CONTROLLED WITH YOUR NAUSEA MEDICATION *UNUSUAL SHORTNESS OF BREATH *UNUSUAL BRUISING OR BLEEDING *URINARY PROBLEMS (pain or burning when urinating, or frequent urination) *BOWEL PROBLEMS (unusual diarrhea, constipation, pain near the anus) TENDERNESS IN MOUTH AND THROAT WITH OR WITHOUT PRESENCE OF ULCERS (sore throat, sores in mouth, or a toothache) UNUSUAL RASH, SWELLING OR PAIN  UNUSUAL VAGINAL DISCHARGE OR ITCHING   Items with * indicate a potential emergency and should be followed up as soon as possible or go to the Emergency Department if any problems should occur.  Please show the CHEMOTHERAPY ALERT CARD or IMMUNOTHERAPY ALERT CARD at check-in to the Emergency Department and triage  nurse.  Should you have questions after your visit or need to cancel or reschedule your appointment, please contact [facility] [phone]  and follow the prompts.  Office hours are 8:00 a.m. to 4:30 p.m. Monday - Friday. Please note that voicemails left after 4:00 p.m. may not be returned until the following business day.  We are closed weekends and major holidays. You have access to a nurse at all times for urgent questions. Please call the main number to the clinic [phone] and follow the prompts.  For any non-urgent questions, you may also contact your provider using MyChart. We now offer e-Visits for anyone 51 and older to request care online for non-urgent symptoms. For details visit [email or web address].   Also download the MyChart app! Go to the app store, search "MyChart", open the app, select [place] [facility], and log in with your MyChart username and password.  Masks are optional in the cancer centers. If you would like for your care team to wear a mask while they are taking care of you, please let them know. You may have one support person who is at least 61 years old accompany you for your appointments.

## 2022-04-17 NOTE — Progress Notes (Signed)
Injection given per orders. Patient tolerated it well without problems. Vitals stable and discharged home from clinic ambulatory. Follow up as scheduled.  

## 2022-04-18 ENCOUNTER — Inpatient Hospital Stay: Payer: No Typology Code available for payment source

## 2022-04-18 VITALS — BP 135/72 | HR 94 | Temp 96.8°F | Resp 18

## 2022-04-18 DIAGNOSIS — Z171 Estrogen receptor negative status [ER-]: Secondary | ICD-10-CM

## 2022-04-18 DIAGNOSIS — Z5112 Encounter for antineoplastic immunotherapy: Secondary | ICD-10-CM | POA: Diagnosis not present

## 2022-04-18 MED ORDER — FILGRASTIM-AYOW 480 MCG/0.8ML ~~LOC~~ SOSY
480.0000 ug | PREFILLED_SYRINGE | Freq: Once | SUBCUTANEOUS | Status: AC
  Administered 2022-04-18: 480 ug via SUBCUTANEOUS
  Filled 2022-04-18: qty 0.8

## 2022-04-18 NOTE — Progress Notes (Signed)
Patient tolerated Releuko injection with no complaints voiced. Site clean and dry with no bruising or swelling noted at site. See MAR for details. Band aid applied.  Patient stable during and after injection. VSS with discharge and left in satisfactory condition with no s/s of distress noted.

## 2022-04-18 NOTE — Patient Instructions (Signed)
[facility]  Discharge Instructions: Thank you for choosing [facility] to provide your oncology and hematology care.  If you have a lab appointment with the [facility], please come in thru the Main Entrance and check in at the main information desk.  Wear comfortable clothing and clothing appropriate for easy access to any Portacath or PICC line.   We strive to give you quality time with your provider. You may need to reschedule your appointment if you arrive late (15 or more minutes).  Arriving late affects you and other patients whose appointments are after yours.  Also, if you miss three or more appointments without notifying the office, you may be dismissed from the clinic at the provider's discretion.      For prescription refill requests, have your pharmacy contact our office and allow 72 hours for refills to be completed.    Today you received the following chemotherapy and/or immunotherapy agents Releuko, return as scheduled.   To help prevent nausea and vomiting after your treatment, we encourage you to take your nausea medication as directed.  BELOW ARE SYMPTOMS THAT SHOULD BE REPORTED IMMEDIATELY: *FEVER GREATER THAN 100.4 F (38 C) OR HIGHER *CHILLS OR SWEATING *NAUSEA AND VOMITING THAT IS NOT CONTROLLED WITH YOUR NAUSEA MEDICATION *UNUSUAL SHORTNESS OF BREATH *UNUSUAL BRUISING OR BLEEDING *URINARY PROBLEMS (pain or burning when urinating, or frequent urination) *BOWEL PROBLEMS (unusual diarrhea, constipation, pain near the anus) TENDERNESS IN MOUTH AND THROAT WITH OR WITHOUT PRESENCE OF ULCERS (sore throat, sores in mouth, or a toothache) UNUSUAL RASH, SWELLING OR PAIN  UNUSUAL VAGINAL DISCHARGE OR ITCHING   Items with * indicate a potential emergency and should be followed up as soon as possible or go to the Emergency Department if any problems should occur.  Please show the CHEMOTHERAPY ALERT CARD or IMMUNOTHERAPY ALERT CARD at  check-in to the Emergency Department and triage nurse.  Should you have questions after your visit or need to cancel or reschedule your appointment, please contact [facility] [phone]  and follow the prompts.  Office hours are 8:00 a.m. to 4:30 p.m. Monday - Friday. Please note that voicemails left after 4:00 p.m. may not be returned until the following business day.  We are closed weekends and major holidays. You have access to a nurse at all times for urgent questions. Please call the main number to the clinic [phone] and follow the prompts.  For any non-urgent questions, you may also contact your provider using MyChart. We now offer e-Visits for anyone 35 and older to request care online for non-urgent symptoms. For details visit [email or web address].   Also download the MyChart app! Go to the app store, search "MyChart", open the app, select [facility], and log in with your MyChart username and password.  Masks are optional in the cancer centers. If you would like for your care team to wear a mask while they are taking care of you, please let them know. You may have one support person who is at least 61 years old accompany you for your appointments.

## 2022-04-19 ENCOUNTER — Inpatient Hospital Stay: Payer: No Typology Code available for payment source

## 2022-04-19 VITALS — BP 134/76 | HR 90 | Temp 97.6°F | Resp 20

## 2022-04-19 DIAGNOSIS — Z5112 Encounter for antineoplastic immunotherapy: Secondary | ICD-10-CM | POA: Diagnosis not present

## 2022-04-19 DIAGNOSIS — Z171 Estrogen receptor negative status [ER-]: Secondary | ICD-10-CM

## 2022-04-19 MED ORDER — FILGRASTIM-AYOW 480 MCG/0.8ML ~~LOC~~ SOSY
480.0000 ug | PREFILLED_SYRINGE | Freq: Once | SUBCUTANEOUS | Status: AC
  Administered 2022-04-19: 480 ug via SUBCUTANEOUS
  Filled 2022-04-19: qty 0.8

## 2022-04-19 NOTE — Progress Notes (Signed)
Patient tolerated injection with no complaints voiced. Site clean and dry with no bruising or swelling noted at site. See MAR for details. Band aid applied.  Patient stable during and after injection. VSS with discharge and left in satisfactory condition with no s/s of distress noted.  

## 2022-04-19 NOTE — Patient Instructions (Signed)
[facility]  Discharge Instructions: Thank you for choosing [facility] to provide your oncology and hematology care.  If you have a lab appointment with the [facility], please come in thru the Main Entrance and check in at the main information desk.  Wear comfortable clothing and clothing appropriate for easy access to any Portacath or PICC line.   We strive to give you quality time with your provider. You may need to reschedule your appointment if you arrive late (15 or more minutes).  Arriving late affects you and other patients whose appointments are after yours.  Also, if you miss three or more appointments without notifying the office, you may be dismissed from the clinic at the provider's discretion.      For prescription refill requests, have your pharmacy contact our office and allow 72 hours for refills to be completed.    Today you received the following chemotherapy and/or immunotherapy agents Releuko, return as scheduled.     To help prevent nausea and vomiting after your treatment, we encourage you to take your nausea medication as directed.  BELOW ARE SYMPTOMS THAT SHOULD BE REPORTED IMMEDIATELY: *FEVER GREATER THAN 100.4 F (38 C) OR HIGHER *CHILLS OR SWEATING *NAUSEA AND VOMITING THAT IS NOT CONTROLLED WITH YOUR NAUSEA MEDICATION *UNUSUAL SHORTNESS OF BREATH *UNUSUAL BRUISING OR BLEEDING *URINARY PROBLEMS (pain or burning when urinating, or frequent urination) *BOWEL PROBLEMS (unusual diarrhea, constipation, pain near the anus) TENDERNESS IN MOUTH AND THROAT WITH OR WITHOUT PRESENCE OF ULCERS (sore throat, sores in mouth, or a toothache) UNUSUAL RASH, SWELLING OR PAIN  UNUSUAL VAGINAL DISCHARGE OR ITCHING   Items with * indicate a potential emergency and should be followed up as soon as possible or go to the Emergency Department if any problems should occur.  Please show the CHEMOTHERAPY ALERT CARD or IMMUNOTHERAPY ALERT CARD at  check-in to the Emergency Department and triage nurse.  Should you have questions after your visit or need to cancel or reschedule your appointment, please contact [facility] [phone]  and follow the prompts.  Office hours are 8:00 a.m. to 4:30 p.m. Monday - Friday. Please note that voicemails left after 4:00 p.m. may not be returned until the following business day.  We are closed weekends and major holidays. You have access to a nurse at all times for urgent questions. Please call the main number to the clinic [phone] and follow the prompts.  For any non-urgent questions, you may also contact your provider using MyChart. We now offer e-Visits for anyone 11 and older to request care online for non-urgent symptoms. For details visit [email or web address].   Also download the MyChart app! Go to the app store, search "MyChart", open the app, select [name], and log in with your MyChart username and password.  Masks are optional in the cancer centers. If you would like for your care team to wear a mask while they are taking care of you, please let them know. You may have one support person who is at least 61 years old accompany you for your appointments.

## 2022-04-23 ENCOUNTER — Other Ambulatory Visit: Payer: Self-pay

## 2022-04-23 DIAGNOSIS — Z171 Estrogen receptor negative status [ER-]: Secondary | ICD-10-CM

## 2022-04-24 ENCOUNTER — Inpatient Hospital Stay: Payer: No Typology Code available for payment source

## 2022-04-24 ENCOUNTER — Encounter: Payer: Self-pay | Admitting: Hematology

## 2022-04-24 ENCOUNTER — Inpatient Hospital Stay (HOSPITAL_BASED_OUTPATIENT_CLINIC_OR_DEPARTMENT_OTHER): Payer: No Typology Code available for payment source | Admitting: Hematology

## 2022-04-24 VITALS — BP 123/81 | HR 93 | Temp 97.7°F | Resp 18

## 2022-04-24 DIAGNOSIS — C50811 Malignant neoplasm of overlapping sites of right female breast: Secondary | ICD-10-CM

## 2022-04-24 DIAGNOSIS — Z5112 Encounter for antineoplastic immunotherapy: Secondary | ICD-10-CM | POA: Diagnosis not present

## 2022-04-24 DIAGNOSIS — Z171 Estrogen receptor negative status [ER-]: Secondary | ICD-10-CM | POA: Diagnosis not present

## 2022-04-24 LAB — COMPREHENSIVE METABOLIC PANEL
ALT: 33 U/L (ref 0–44)
AST: 22 U/L (ref 15–41)
Albumin: 3.8 g/dL (ref 3.5–5.0)
Alkaline Phosphatase: 113 U/L (ref 38–126)
Anion gap: 9 (ref 5–15)
BUN: 18 mg/dL (ref 8–23)
CO2: 26 mmol/L (ref 22–32)
Calcium: 9.4 mg/dL (ref 8.9–10.3)
Chloride: 104 mmol/L (ref 98–111)
Creatinine, Ser: 0.79 mg/dL (ref 0.44–1.00)
GFR, Estimated: >60 mL/min (ref >60)
Glucose, Bld: 177 mg/dL — ABNORMAL HIGH (ref 70–99)
Potassium: 4.1 mmol/L (ref 3.5–5.1)
Sodium: 139 mmol/L (ref 135–145)
Total Bilirubin: 0.8 mg/dL (ref 0.3–1.2)
Total Protein: 7.2 g/dL (ref 6.5–8.1)

## 2022-04-24 LAB — CBC WITH DIFFERENTIAL/PLATELET
Abs Immature Granulocytes: 0.26 10*3/uL — ABNORMAL HIGH (ref 0.00–0.07)
Basophils Absolute: 0.2 10*3/uL — ABNORMAL HIGH (ref 0.0–0.1)
Basophils Relative: 2 %
Eosinophils Absolute: 0.1 10*3/uL (ref 0.0–0.5)
Eosinophils Relative: 1 %
HCT: 31.4 % — ABNORMAL LOW (ref 36.0–46.0)
Hemoglobin: 11 g/dL — ABNORMAL LOW (ref 12.0–15.0)
Immature Granulocytes: 3 %
Lymphocytes Relative: 17 %
Lymphs Abs: 1.5 10*3/uL (ref 0.7–4.0)
MCH: 31 pg (ref 26.0–34.0)
MCHC: 35 g/dL (ref 30.0–36.0)
MCV: 88.5 fL (ref 80.0–100.0)
Monocytes Absolute: 0.6 10*3/uL (ref 0.1–1.0)
Monocytes Relative: 7 %
Neutro Abs: 6.2 10*3/uL (ref 1.7–7.7)
Neutrophils Relative %: 70 %
Platelets: 119 10*3/uL — ABNORMAL LOW (ref 150–400)
RBC: 3.55 MIL/uL — ABNORMAL LOW (ref 3.87–5.11)
RDW: 15.2 % (ref 11.5–15.5)
WBC: 8.8 10*3/uL (ref 4.0–10.5)
nRBC: 0.3 % — ABNORMAL HIGH (ref 0.0–0.2)

## 2022-04-24 LAB — MAGNESIUM: Magnesium: 1.5 mg/dL — ABNORMAL LOW (ref 1.7–2.4)

## 2022-04-24 MED ORDER — HEPARIN SOD (PORK) LOCK FLUSH 100 UNIT/ML IV SOLN
500.0000 [IU] | Freq: Once | INTRAVENOUS | Status: AC | PRN
  Administered 2022-04-24: 500 [IU]

## 2022-04-24 MED ORDER — PALONOSETRON HCL INJECTION 0.25 MG/5ML
0.2500 mg | Freq: Once | INTRAVENOUS | Status: AC
  Administered 2022-04-24: 0.25 mg via INTRAVENOUS
  Filled 2022-04-24: qty 5

## 2022-04-24 MED ORDER — SODIUM CHLORIDE 0.9 % IV SOLN
150.0000 mg | Freq: Once | INTRAVENOUS | Status: AC
  Administered 2022-04-24: 150 mg via INTRAVENOUS
  Filled 2022-04-24: qty 5

## 2022-04-24 MED ORDER — SODIUM CHLORIDE 0.9 % IV SOLN: Freq: Once | INTRAVENOUS | Status: AC

## 2022-04-24 MED ORDER — SODIUM CHLORIDE 0.9 % IV SOLN
500.0000 mg | Freq: Once | INTRAVENOUS | Status: AC
  Administered 2022-04-24: 500 mg via INTRAVENOUS
  Filled 2022-04-24: qty 50

## 2022-04-24 MED ORDER — DIPHENHYDRAMINE HCL 50 MG/ML IJ SOLN
25.0000 mg | Freq: Once | INTRAMUSCULAR | Status: AC
  Administered 2022-04-24: 25 mg via INTRAVENOUS
  Filled 2022-04-24: qty 1

## 2022-04-24 MED ORDER — MAGNESIUM SULFATE 2 GM/50ML IV SOLN
2.0000 g | Freq: Once | INTRAVENOUS | Status: AC
  Administered 2022-04-24: 2 g via INTRAVENOUS
  Filled 2022-04-24: qty 50

## 2022-04-24 MED ORDER — FAMOTIDINE IN NACL 20-0.9 MG/50ML-% IV SOLN
20.0000 mg | Freq: Once | INTRAVENOUS | Status: AC
  Administered 2022-04-24: 20 mg via INTRAVENOUS
  Filled 2022-04-24: qty 50

## 2022-04-24 MED ORDER — SODIUM CHLORIDE 0.9 % IV SOLN
200.0000 mg | Freq: Once | INTRAVENOUS | Status: AC
  Administered 2022-04-24: 200 mg via INTRAVENOUS
  Filled 2022-04-24: qty 8

## 2022-04-24 MED ORDER — MAGNESIUM OXIDE -MG SUPPLEMENT 400 (240 MG) MG PO TABS: 400.0000 mg | ORAL_TABLET | Freq: Three times a day (TID) | ORAL | 2 refills | Status: DC

## 2022-04-24 MED ORDER — SODIUM CHLORIDE 0.9% FLUSH
10.0000 mL | INTRAVENOUS | Status: DC | PRN
  Administered 2022-04-24: 10 mL

## 2022-04-24 MED ORDER — SODIUM CHLORIDE 0.9 % IV SOLN
80.0000 mg/m2 | Freq: Once | INTRAVENOUS | Status: AC
  Administered 2022-04-24: 150 mg via INTRAVENOUS
  Filled 2022-04-24: qty 25

## 2022-04-24 MED ORDER — SODIUM CHLORIDE 0.9% FLUSH
10.0000 mL | INTRAVENOUS | Status: DC | PRN
  Administered 2022-04-24: 10 mL via INTRAVENOUS

## 2022-04-24 NOTE — Progress Notes (Signed)
Dr Delton Coombes wants flat dose of carboplatin at 500 mg to be given in treatment plan.  Orders updated.  T.O. Dr Rhys Martini, PharmD

## 2022-04-24 NOTE — Patient Instructions (Signed)
[facility]  Discharge Instructions: Thank you for choosing [facility] to provide your oncology and hematology care.  If you have a lab appointment with the [facility], please come in thru the Main Entrance and check in at the main information desk.  Wear comfortable clothing and clothing appropriate for easy access to any Portacath or PICC line.   We strive to give you quality time with your provider. You may need to reschedule your appointment if you arrive late (15 or more minutes).  Arriving late affects you and other patients whose appointments are after yours.  Also, if you miss three or more appointments without notifying the office, you may be dismissed from the clinic at the provider's discretion.      For prescription refill requests, have your pharmacy contact our office and allow 72 hours for refills to be completed.    Today you received the following chemotherapy and/or immunotherapy agents Keytruda, Taxol, and Carboplatin.   To help prevent nausea and vomiting after your treatment, we encourage you to take your nausea medication as directed.  Pembrolizumab Injection What is this medication? PEMBROLIZUMAB (PEM broe LIZ ue mab) treats some types of cancer. It works by helping your immune system slow or stop the spread of cancer cells. It is a monoclonal antibody. This medicine may be used for other purposes; ask your health care provider or pharmacist if you have questions. COMMON BRAND NAME(S): Keytruda What should I tell my care team before I take this medication? They need to know if you have any of these conditions: Allogeneic stem cell transplant (uses someone else's stem cells) Autoimmune diseases, such as Crohn disease, ulcerative colitis, lupus History of chest radiation Nervous system problems, such as Guillain-Barre syndrome, myasthenia gravis Organ transplant An unusual or allergic reaction to pembrolizumab, other medications,  foods, dyes, or preservatives Pregnant or trying to get pregnant Breast-feeding How should I use this medication? This medication is injected into a vein. It is given by your care team in a hospital or clinic setting. A special MedGuide will be given to you before each treatment. Be sure to read this information carefully each time. Talk to your care team about the use of this medication in children. While it may be prescribed for children as young as 6 months for selected conditions, precautions do apply. Overdosage: If you think you have taken too much of this medicine contact a poison control center or emergency room at once. NOTE: This medicine is only for you. Do not share this medicine with others. What if I miss a dose? Keep appointments for follow-up doses. It is important not to miss your dose. Call your care team if you are unable to keep an appointment. What may interact with this medication? Interactions have not been studied. This list may not describe all possible interactions. Give your health care provider a list of all the medicines, herbs, non-prescription drugs, or dietary supplements you use. Also tell them if you smoke, drink alcohol, or use illegal drugs. Some items may interact with your medicine. What should I watch for while using this medication? Your condition will be monitored carefully while you are receiving this medication. You may need blood work while taking this medication. This medication may cause serious skin reactions. They can happen weeks to months after starting the medication. Contact your care team right away if you notice fevers or flu-like symptoms with a rash. The rash may be red or purple and  then turn into blisters or peeling of the skin. You may also notice a red rash with swelling of the face, lips, or lymph nodes in your neck or under your arms. Tell your care team right away if you have any change in your eyesight. Talk to your care team if you  may be pregnant. Serious birth defects can occur if you take this medication during pregnancy and for 4 months after the last dose. You will need a negative pregnancy test before starting this medication. Contraception is recommended while taking this medication and for 4 months after the last dose. Your care team can help you find the option that works for you. Do not breastfeed while taking this medication and for 4 months after the last dose. What side effects may I notice from receiving this medication? Side effects that you should report to your care team as soon as possible: Allergic reactions--skin rash, itching, hives, swelling of the face, lips, tongue, or throat Dry cough, shortness of breath or trouble breathing Eye pain, redness, irritation, or discharge with blurry or decreased vision Heart muscle inflammation--unusual weakness or fatigue, shortness of breath, chest pain, fast or irregular heartbeat, dizziness, swelling of the ankles, feet, or hands Hormone gland problems--headache, sensitivity to light, unusual weakness or fatigue, dizziness, fast or irregular heartbeat, increased sensitivity to cold or heat, excessive sweating, constipation, hair loss, increased thirst or amount of urine, tremors or shaking, irritability Infusion reactions--chest pain, shortness of breath or trouble breathing, feeling faint or lightheaded Kidney injury (glomerulonephritis)--decrease in the amount of urine, red or dark brown urine, foamy or bubbly urine, swelling of the ankles, hands, or feet Liver injury--right upper belly pain, loss of appetite, nausea, light-colored stool, dark yellow or brown urine, yellowing skin or eyes, unusual weakness or fatigue Pain, tingling, or numbness in the hands or feet, muscle weakness, change in vision, confusion or trouble speaking, loss of balance or coordination, trouble walking, seizures Rash, fever, and swollen lymph nodes Redness, blistering, peeling, or loosening  of the skin, including inside the mouth Sudden or severe stomach pain, bloody diarrhea, fever, nausea, vomiting Side effects that usually do not require medical attention (report to your care team if they continue or are bothersome): Bone, joint, or muscle pain Diarrhea Fatigue Loss of appetite Nausea Skin rash This list may not describe all possible side effects. Call your doctor for medical advice about side effects. You may report side effects to FDA at 1-800-FDA-1088. Where should I keep my medication? This medication is given in a hospital or clinic. It will not be stored at home. NOTE: This sheet is a summary. It may not cover all possible information. If you have questions about this medicine, talk to your doctor, pharmacist, or health care provider.  2023 Elsevier/Gold Standard (2013-01-12 00:00:00)  Paclitaxel Injection What is this medication? PACLITAXEL (PAK li TAX el) treats some types of cancer. It works by slowing down the growth of cancer cells. This medicine may be used for other purposes; ask your health care provider or pharmacist if you have questions. COMMON BRAND NAME(S): Onxol, Taxol What should I tell my care team before I take this medication? They need to know if you have any of these conditions: Heart disease Liver disease Low white blood cell levels An unusual or allergic reaction to paclitaxel, other medications, foods, dyes, or preservatives If you or your partner are pregnant or trying to get pregnant Breast-feeding How should I use this medication? This medication is injected into a  vein. It is given by your care team in a hospital or clinic setting. Talk to your care team about the use of this medication in children. While it may be given to children for selected conditions, precautions do apply. Overdosage: If you think you have taken too much of this medicine contact a poison control center or emergency room at once. NOTE: This medicine is only for  you. Do not share this medicine with others. What if I miss a dose? Keep appointments for follow-up doses. It is important not to miss your dose. Call your care team if you are unable to keep an appointment. What may interact with this medication? Do not take this medication with any of the following: Live virus vaccines Other medications may affect the way this medication works. Talk with your care team about all of the medications you take. They may suggest changes to your treatment plan to lower the risk of side effects and to make sure your medications work as intended. This list may not describe all possible interactions. Give your health care provider a list of all the medicines, herbs, non-prescription drugs, or dietary supplements you use. Also tell them if you smoke, drink alcohol, or use illegal drugs. Some items may interact with your medicine. What should I watch for while using this medication? Your condition will be monitored carefully while you are receiving this medication. You may need blood work while taking this medication. This medication may make you feel generally unwell. This is not uncommon as chemotherapy can affect healthy cells as well as cancer cells. Report any side effects. Continue your course of treatment even though you feel ill unless your care team tells you to stop. This medication can cause serious allergic reactions. To reduce the risk, your care team may give you other medications to take before receiving this one. Be sure to follow the directions from your care team. This medication may increase your risk of getting an infection. Call your care team for advice if you get a fever, chills, sore throat, or other symptoms of a cold or flu. Do not treat yourself. Try to avoid being around people who are sick. This medication may increase your risk to bruise or bleed. Call your care team if you notice any unusual bleeding. Be careful brushing or flossing your teeth or  using a toothpick because you may get an infection or bleed more easily. If you have any dental work done, tell your dentist you are receiving this medication. Talk to your care team if you may be pregnant. Serious birth defects can occur if you take this medication during pregnancy. Talk to your care team before breastfeeding. Changes to your treatment plan may be needed. What side effects may I notice from receiving this medication? Side effects that you should report to your care team as soon as possible: Allergic reactions--skin rash, itching, hives, swelling of the face, lips, tongue, or throat Heart rhythm changes--fast or irregular heartbeat, dizziness, feeling faint or lightheaded, chest pain, trouble breathing Increase in blood pressure Infection--fever, chills, cough, sore throat, wounds that don't heal, pain or trouble when passing urine, general feeling of discomfort or being unwell Low blood pressure--dizziness, feeling faint or lightheaded, blurry vision Low red blood cell level--unusual weakness or fatigue, dizziness, headache, trouble breathing Painful swelling, warmth, or redness of the skin, blisters or sores at the infusion site Pain, tingling, or numbness in the hands or feet Slow heartbeat--dizziness, feeling faint or lightheaded, confusion, trouble breathing, unusual  weakness or fatigue Unusual bruising or bleeding Side effects that usually do not require medical attention (report to your care team if they continue or are bothersome): Diarrhea Hair loss Joint pain Loss of appetite Muscle pain Nausea Vomiting This list may not describe all possible side effects. Call your doctor for medical advice about side effects. You may report side effects to FDA at 1-800-FDA-1088. Where should I keep my medication? This medication is given in a hospital or clinic. It will not be stored at home. NOTE: This sheet is a summary. It may not cover all possible information. If you have  questions about this medicine, talk to your doctor, pharmacist, or health care provider.  2023 Elsevier/Gold Standard (2021-08-23 00:00:00)  Carboplatin Injection What is this medication? CARBOPLATIN (KAR boe pla tin) treats some types of cancer. It works by slowing down the growth of cancer cells. This medicine may be used for other purposes; ask your health care provider or pharmacist if you have questions. COMMON BRAND NAME(S): Paraplatin What should I tell my care team before I take this medication? They need to know if you have any of these conditions: Blood disorders Hearing problems Kidney disease Recent or ongoing radiation therapy An unusual or allergic reaction to carboplatin, cisplatin, other medications, foods, dyes, or preservatives Pregnant or trying to get pregnant Breast-feeding How should I use this medication? This medication is injected into a vein. It is given by your care team in a hospital or clinic setting. Talk to your care team about the use of this medication in children. Special care may be needed. Overdosage: If you think you have taken too much of this medicine contact a poison control center or emergency room at once. NOTE: This medicine is only for you. Do not share this medicine with others. What if I miss a dose? Keep appointments for follow-up doses. It is important not to miss your dose. Call your care team if you are unable to keep an appointment. What may interact with this medication? Medications for seizures Some antibiotics, such as amikacin, gentamicin, neomycin, streptomycin, tobramycin Vaccines This list may not describe all possible interactions. Give your health care provider a list of all the medicines, herbs, non-prescription drugs, or dietary supplements you use. Also tell them if you smoke, drink alcohol, or use illegal drugs. Some items may interact with your medicine. What should I watch for while using this medication? Your condition  will be monitored carefully while you are receiving this medication. You may need blood work while taking this medication. This medication may make you feel generally unwell. This is not uncommon, as chemotherapy can affect healthy cells as well as cancer cells. Report any side effects. Continue your course of treatment even though you feel ill unless your care team tells you to stop. In some cases, you may be given additional medications to help with side effects. Follow all directions for their use. This medication may increase your risk of getting an infection. Call your care team for advice if you get a fever, chills, sore throat, or other symptoms of a cold or flu. Do not treat yourself. Try to avoid being around people who are sick. Avoid taking medications that contain aspirin, acetaminophen, ibuprofen, naproxen, or ketoprofen unless instructed by your care team. These medications may hide a fever. Be careful brushing or flossing your teeth or using a toothpick because you may get an infection or bleed more easily. If you have any dental work done, tell your dentist  you are receiving this medication. Talk to your care team if you wish to become pregnant or think you might be pregnant. This medication can cause serious birth defects. Talk to your care team about effective forms of contraception. Do not breast-feed while taking this medication. What side effects may I notice from receiving this medication? Side effects that you should report to your care team as soon as possible: Allergic reactions--skin rash, itching, hives, swelling of the face, lips, tongue, or throat Infection--fever, chills, cough, sore throat, wounds that don't heal, pain or trouble when passing urine, general feeling of discomfort or being unwell Low red blood cell level--unusual weakness or fatigue, dizziness, headache, trouble breathing Pain, tingling, or numbness in the hands or feet, muscle weakness, change in vision,  confusion or trouble speaking, loss of balance or coordination, trouble walking, seizures Unusual bruising or bleeding Side effects that usually do not require medical attention (report to your care team if they continue or are bothersome): Hair loss Nausea Unusual weakness or fatigue Vomiting This list may not describe all possible side effects. Call your doctor for medical advice about side effects. You may report side effects to FDA at 1-800-FDA-1088. Where should I keep my medication? This medication is given in a hospital or clinic. It will not be stored at home. NOTE: This sheet is a summary. It may not cover all possible information. If you have questions about this medicine, talk to your doctor, pharmacist, or health care provider.  2023 Elsevier/Gold Standard (2021-08-07 00:00:00)    BELOW ARE SYMPTOMS THAT SHOULD BE REPORTED IMMEDIATELY: *FEVER GREATER THAN 100.4 F (38 C) OR HIGHER *CHILLS OR SWEATING *NAUSEA AND VOMITING THAT IS NOT CONTROLLED WITH YOUR NAUSEA MEDICATION *UNUSUAL SHORTNESS OF BREATH *UNUSUAL BRUISING OR BLEEDING *URINARY PROBLEMS (pain or burning when urinating, or frequent urination) *BOWEL PROBLEMS (unusual diarrhea, constipation, pain near the anus) TENDERNESS IN MOUTH AND THROAT WITH OR WITHOUT PRESENCE OF ULCERS (sore throat, sores in mouth, or a toothache) UNUSUAL RASH, SWELLING OR PAIN  UNUSUAL VAGINAL DISCHARGE OR ITCHING   Items with * indicate a potential emergency and should be followed up as soon as possible or go to the Emergency Department if any problems should occur.  Please show the CHEMOTHERAPY ALERT CARD or IMMUNOTHERAPY ALERT CARD at check-in to the Emergency Department and triage nurse.  Should you have questions after your visit or need to cancel or reschedule your appointment, please contact [facility] [phone]  and follow the prompts.  Office hours are 8:00 a.m. to 4:30 p.m. Monday - Friday. Please note  that voicemails left after 4:00 p.m. may not be returned until the following business day.  We are closed weekends and major holidays. You have access to a nurse at all times for urgent questions. Please call the main number to the clinic [phone] and follow the prompts.  For any non-urgent questions, you may also contact your provider using MyChart. We now offer e-Visits for anyone 66 and older to request care online for non-urgent symptoms. For details visit [email or web address].   Also download the MyChart app! Go to the app store, search "MyChart", open the app, select [facility], and log in with your MyChart username and password.  Masks are optional in the cancer centers. If you would like for your care team to wear a mask while they are taking care of you, please let them know. You may have one support person who is at least 61 years old accompany you  for your appointments.

## 2022-04-24 NOTE — Progress Notes (Signed)
Patient has been examined by Dr. Katragadda, and vital signs and labs have been reviewed. ANC, Creatinine, LFTs, hemoglobin, and platelets are within treatment parameters per M.D. - pt may proceed with treatment.  Primary RN and pharmacy notified.  

## 2022-04-24 NOTE — Progress Notes (Signed)
[place], [place] [address]   CLINIC:  Medical Oncology/Hematology  PCP:  Claretta Fraise, MD [place]  [phone]   REASON FOR VISIT:  Follow-up for locally advanced TNBC  PRIOR THERAPY: None  NGS Results: Not done  CURRENT THERAPY: Pembrolizumab, carboplatin and paclitaxel followed by pembrolizumab, Adriamycin and Cytoxan  BRIEF ONCOLOGIC HISTORY:  Oncology History Overview Note   Cancer Staging  Malignant neoplasm of overlapping sites of right female breast [facility]) Staging form: Breast, AJCC 8th Edition - Clinical stage from 03/08/2022: Stage IIIC (cT2, cN2, cM0, G3, ER-, PR-, HER2-) - Signed by Truitt Merle, MD on 03/08/2022    Malignant neoplasm of overlapping sites of right female breast [facility])  02/20/2022 Mammogram   CLINICAL DATA:  61 year old female recalled from screening mammography 03/01/2021 for right breast calcifications and subsequent benign discordant biopsy of these calcifications in the central posterior right breast December 2022 with excision recommended. The patient initially followed up with surgery in April however canceled her scheduled surgery and most recently followed up with Dr. [name] September 2023 with diagnostic imaging, possible RF tag placement and subsequent excision recommended.   EXAM: DIGITAL DIAGNOSTIC BILATERAL MAMMOGRAM WITH TOMOSYNTHESIS; ULTRASOUND RIGHT BREAST LIMITED  MPRESSION: 1.  Suspicious right axillary lymphadenopathy.   2. Indeterminate intramammary lymph node in the right breast at 9 o'clock.   3. 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   4. Decreased calcifications noted at prior benign biopsy site in the lower central posterior right breast at site of X shaped biopsy marking clip.   02/20/2022  Initial Biopsy   FINAL MICROSCOPIC DIAGNOSIS:   A. AXILLA, RIGHT, LYMPH NODE, NEEDLE CORE BIOPSY:  - Positive for carcinoma (see Comment)   B. LYMPH NODE, RIGHT BREAST, BIOPSY:  - Negative for carcinoma   COMMENT:  Part A: Morphology and immunohistochemical staining are most compatible with primary breast carcinoma with metaplastic changes, however differential diagnosis also includes urothelial carcinoma and less likely primary lung carcinoma (squamous).  No lymphoid tissue is identified.  Clinical and radiologic correlation is suggested.   ADDENDUM:  In case of a breast origin, the appropriate grade would be grade 3  (3+3+2)   ADDENDUM:  PROGNOSTIC INDICATOR RESULTS:  The tumor cells are EQUIVOCAL for Her2 (2+).  Estrogen Receptor:       0%, NEGATIVE  Progesterone Receptor:   0%, NEGATIVE  Proliferation Marker Ki-67:   60%   ADDENDUM:  FLOURESCENCE IN-SITU HYBRIDIZATION RESULTS:  GROUP 5:   HER2 **NEGATIVE**    03/02/2022 Imaging   EXAM: BILATERAL BREAST MRI WITH AND WITHOUT CONTRAST  IMPRESSION: 1. There is a suspicious 3.8 cm area of patchy non mass enhancement in a linear orientation in the slightly upper outer right breast in the mid to posterior depth spanning 3.8 cm.   2. Diffuse skin thickening with enhancement of the skin, concerning for inflammatory breast cancer.   3. Numerous bulky matted lymph nodes in the right axilla, one of which corresponds with the biopsy-proven metastatic lymph node.   4.  No evidence of left breast malignancy.   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 ([name])   03/08/2022 Cancer Staging   Staging form: Breast, AJCC 8th Edition - Clinical stage from 03/08/2022: Stage IIIC (cT4d, cN2, cM0, G3, ER-, PR-, HER2-) -  Signed by Truitt Merle, MD on 03/08/2022 Histologic grading system: 3 grade system   03/19/2022 Pathology Results   Diagnosis Breast, right, needle core biopsy, upper outer  quadrant, barbell clip BENIGN BREAST WITH FIBROCYSTIC CHANGES INCLUDING STROMAL FIBROSIS, ADENOSIS AND USUAL DUCT HYPERPLASIA BENIGN FIBROMATOID CHANGE NEGATIVE FOR MICROCALCIFICATIONS NEGATIVE FOR CARCINOMA   03/22/2022 PET scan   IMPRESSION: Bulky hypermetabolic right axillary and subpectoral lymphadenopathy, consistent with metastatic disease.   No other definite sites of metastatic disease identified.   6 mm right lower lobe pulmonary nodule shows no FDG uptake, but is too small to definitively characterize by PET. Recommend continued follow-up by chest CT in 3-4 months.   Aortic Atherosclerosis (ICD10-I70.0).   04/02/2022 -  Chemotherapy   Patient is on Treatment Plan : BREAST Pembrolizumab (200) D1 + Carboplatin (5) D1 + Paclitaxel (80) D1,8,15 q21d X 4 cycles / Pembrolizumab (200) D1 + AC D1 q21d x 4 cycles       CANCER STAGING:  Cancer Staging  Malignant neoplasm of overlapping sites of right female breast [facility]) Staging form: Breast, AJCC 8th Edition - Clinical stage from 03/08/2022: Stage IIIC (cT4d, cN2, cM0, G3, ER-, PR-, HER2-) - Signed by Truitt Merle, MD on 03/08/2022    INTERVAL HISTORY:  Marilyn Ford 61 y.o. female seen for follow-up of triple negative breast cancer.  She has completed 3 weekly doses of paclitaxel and is here to start cycle 2 with carboplatin and pembrolizumab.  Reports energy level 75%.  Denies any tingling or numbness in extremities.  Denies any GI side effects.  She is accompanied by her husband.    REVIEW OF SYSTEMS:  Review of Systems  Constitutional:  Positive for fatigue.  Psychiatric/Behavioral:  Positive for sleep disturbance.   All other systems reviewed and are negative.    PAST MEDICAL/SURGICAL HISTORY:  Past Medical History:  Diagnosis Date   Allergy    Anxiety    Breast cancer ([place])    Diabetes mellitus without complication ([place])    GERD (gastroesophageal reflux disease)    Heart murmur    as a child   History of kidney  stones    Hypertension    Pneumonia    Stroke [facility])    Vaginal Pap smear, abnormal    Past Surgical History:  Procedure Laterality Date   CESAREAN SECTION     CHOLECYSTECTOMY     COLONOSCOPY WITH PROPOFOL N/A 07/19/2020   Procedure: COLONOSCOPY WITH PROPOFOL;  Surgeon: Harvel Quale, MD;  Location: [facility];  Service: Gastroenterology;  Laterality: N/A;  AM   POLYPECTOMY  07/19/2020   Procedure: POLYPECTOMY;  Surgeon: Harvel Quale, MD;  Location: [facility];  Service: Gastroenterology;;   PORTACATH PLACEMENT N/A 03/20/2022   Procedure: INSERTION PORT-A-CATH;  Surgeon: Coralie Keens, MD;  Location: [facility] OR[facility];  Service: General;  Laterality: N/A;     SOCIAL HISTORY:  Social History   Socioeconomic History   Marital status: Married    Spouse name: Jeneen Rinks   Number of children: 2   Years of education: 10   Highest education level: 10th grade  Occupational History   Occupation: disabled  Tobacco Use   Smoking status: Former    Packs/day: 0.50    Years: 25.00    Total pack years: 12.50    Types: Cigarettes    Start date: 12/08/1999    Quit date: 03/12/2015    Years since quitting: 7.1   Smokeless tobacco: Never  Vaping Use   Vaping Use: Never  used  Substance and Sexual Activity   Alcohol use: Yes    Alcohol/week: 2.0 - 3.0 standard drinks of alcohol    Types: 2 - 3 Glasses of wine per week   Drug use: No   Sexual activity: Not Currently    Birth control/protection: Abstinence, Post-menopausal  Other Topics Concern   Not on file  Social History Narrative   Volunteers at [employer] for a few hours every day.    She really enjoys getting out of of the house and working there.    Social Determinants of Health   Financial Resource Strain: Low Risk  (03/01/2022)   Overall Financial Resource Strain (CARDIA)    Difficulty of Paying Living Expenses: Not very hard  Food Insecurity: No Food Insecurity (04/09/2022)   Hunger Vital Sign     Worried About Running Out of Food in the Last Year: Never true    Ran Out of Food in the Last Year: Never true  Transportation Needs: No Transportation Needs (03/01/2022)   PRAPARE - Hydrologist (Medical): No    Lack of Transportation (Non-Medical): No  Physical Activity: Insufficiently Active (03/01/2022)   Exercise Vital Sign    Days of Exercise per Week: 1 day    Minutes of Exercise per Session: 10 min  Stress: Stress Concern Present (03/01/2022)   [place]    Feeling of Stress : Rather much  Social Connections: Moderately Isolated (03/01/2022)   Social Connection and Isolation Panel [NHANES]    Frequency of Communication with Friends and Family: Twice a week    Frequency of Social Gatherings with Friends and Family: Twice a week    Attends Religious Services: Never    Marine scientist or Organizations: No    Attends Archivist Meetings: Never    Marital Status: Married  Human resources officer Violence: Not At Risk (04/09/2022)   Humiliation, Afraid, Rape, and Kick questionnaire    Fear of Current or Ex-Partner: No    Emotionally Abused: No    Physically Abused: No    Sexually Abused: No    FAMILY HISTORY:  Family History  Problem Relation Age of Onset   Diabetes Mother    Cervical cancer Mother    Diabetes Brother    Breast cancer Maternal Aunt     CURRENT MEDICATIONS:  Outpatient Encounter Medications as of 04/24/2022  Medication Sig   Alcohol Swabs (B-D SINGLE USE SWABS REGULAR) PADS Test BS daily and as needed Dx E11.9   amLODipine (NORVASC) 10 MG tablet 1 tablet daily   Apple Cider Vinegar 500 MG TABS Take 500 mg by mouth in the morning.   aspirin 81 MG EC tablet Take 1 tablet (81 mg total) by mouth daily.   atorvastatin (LIPITOR) 80 MG tablet TAKE 1 TABLET EVERY DAY AT 6PM   Blood Glucose Calibration (TRUE METRIX LEVEL 1) Low SOLN Use with glucometer Dx  E11.9   Blood Glucose Monitoring Suppl (TRUE METRIX AIR GLUCOSE METER) w/Device KIT Test BS daily and as needed Dx E11.9   Calcium Carb-Cholecalciferol (CALCIUM-VITAMIN D) 600-400 MG-UNIT TABS Take 1 tablet by mouth in the morning.   Coenzyme Q10 (COQ10) 100 MG CAPS Take 100 mg by mouth in the morning.   Cranberry 425 MG CAPS Take 425 mg by mouth in the morning and at bedtime.   Dulaglutide (TRULICITY) 4.5 ZO/1.0RU SOPN Inject 4.5 mg as directed once a week. (Patient taking  differently: Inject 4.5 mg as directed every Saturday.)   Flaxseed, Linseed, (FLAXSEED OIL) 1000 MG CAPS Take 1,000 mg by mouth in the morning.   glucose blood (TRUE METRIX BLOOD GLUCOSE TEST) test strip Test BS daily and as needed Dx E11.9   Krill Oil 500 MG CAPS Take 3 capsules (1,500 mg total) by mouth in the morning and at bedtime.   lidocaine-prilocaine (EMLA) cream Apply to affected area once   metoprolol (TOPROL-XL) 200 MG 24 hr tablet TAKE 1 TABLET ONE TIME DAILY, WITH OR IMMEDIATELY FOLLOWING A MEAL   Multiple Vitamin (MULTIVITAMIN) capsule Take 1 capsule by mouth in the morning.   ondansetron (ZOFRAN) 8 MG tablet Take 1 tablet (8 mg total) by mouth every 8 (eight) hours as needed for nausea or vomiting. Start on the third day after chemotherapy.   pantoprazole (PROTONIX) 40 MG tablet TAKE 1 TABLET twice daily FOR STOMACH   prochlorperazine (COMPAZINE) 10 MG tablet Take 1 tablet (10 mg total) by mouth every 6 (six) hours as needed for nausea or vomiting.   traMADol (ULTRAM) 50 MG tablet Take 1 tablet (50 mg total) by mouth every 6 (six) hours as needed for moderate pain or severe pain.   traZODone (DESYREL) 150 MG tablet TAKE 1 OR 2 TABLETS AT BEDTIME FOR SLEEP (Patient taking differently: Take 75 mg by mouth at bedtime.)   TRUEplus Lancets 33G MISC Test BS daily and as needed Dx E11.9   valsartan (DIOVAN) 320 MG tablet TAKE 1 TABLET (320 MG TOTAL) BY MOUTH DAILY. FOR BLOOD PRESSURE.   zinc gluconate 50 MG tablet Take  50 mg by mouth daily.   [DISCONTINUED] magnesium oxide (MAG-OX) 400 (240 Mg) MG tablet Take 1 tablet (400 mg total) by mouth 2 (two) times daily.   magnesium oxide (MAG-OX) 400 (240 Mg) MG tablet Take 1 tablet (400 mg total) by mouth in the morning, at noon, and at bedtime.   Facility-Administered Encounter Medications as of 04/24/2022  Medication   [DISCONTINUED] sodium chloride flush (NS) 0.9 % injection 10 mL    ALLERGIES:  Allergies  Allergen Reactions   Penicillins Shortness Of Breath   Sulfa Antibiotics Rash     PHYSICAL EXAM:  ECOG Performance status: 1  There were no vitals filed for this visit. There were no vitals filed for this visit. Physical Exam Vitals reviewed.  Constitutional:      Appearance: Normal appearance.  Cardiovascular:     Rate and Rhythm: Normal rate and regular rhythm.     Heart sounds: Normal heart sounds.  Pulmonary:     Effort: Pulmonary effort is normal.     Breath sounds: Normal breath sounds.  Abdominal:     Palpations: Abdomen is soft. There is no mass.  Neurological:     Mental Status: She is alert.  Psychiatric:        Mood and Affect: Mood normal.        Behavior: Behavior normal.      LABORATORY DATA:  I have reviewed the labs as listed.  CBC    Component Value Date/Time   WBC 8.8 04/24/2022 0842   RBC 3.55 (L) 04/24/2022 0842   HGB 11.0 (L) 04/24/2022 0842   HGB 13.5 04/02/2022 0924   HGB 14.1 03/07/2022 0942   HCT 31.4 (L) 04/24/2022 0842   HCT 40.3 03/07/2022 0942   PLT 119 (L) 04/24/2022 0842   PLT 228 04/02/2022 0924   PLT 336 03/07/2022 0942   MCV 88.5 04/24/2022 0842  MCV 91 03/07/2022 0942   MCH 31.0 04/24/2022 0842   MCHC 35.0 04/24/2022 0842   RDW 15.2 04/24/2022 0842   RDW 12.6 03/07/2022 0942   LYMPHSABS 1.5 04/24/2022 0842   LYMPHSABS 1.4 03/07/2022 0942   MONOABS 0.6 04/24/2022 0842   EOSABS 0.1 04/24/2022 0842   EOSABS 0.2 03/07/2022 0942   BASOSABS 0.2 (H) 04/24/2022 0842   BASOSABS 0.1  03/07/2022 0942      Latest Ref Rng & Units 04/24/2022    8:42 AM 04/16/2022    1:15 PM 04/09/2022   12:42 PM  CMP  Glucose 70 - 99 mg/dL 177  138  139   BUN 8 - 23 mg/dL _0 Creatinine 0.44 - 1.00 mg/dL 0.79  0.83  0.83   Sodium 135 - 145 mmol/L 139  138  135   Potassium 3.5 - 5.1 mmol/L 4.1  4.2  3.9   Chloride 98 - 111 mmol/L 104  106  99   CO2 22 - 32 mmol/L _1 Calcium 8.9 - 10.3 mg/dL 9.4  8.8  10.0   Total Protein 6.5 - 8.1 g/dL 7.2  7.2  7.2   Total Bilirubin 0.3 - 1.2 mg/dL 0.8  0.5  0.6   Alkaline Phos 38 - 126 U/L 113  89  101   AST 15 - 41 U/L 22  33  43   ALT 0 - 44 U/L 33  48  68     DIAGNOSTIC IMAGING:  I have independently reviewed the scans and discussed with the patient.  ASSESSMENT: 1.  Inflammatory right breast cancer: - Bilateral diagnostic mammogram (02/20/2022): Suspicious right axillary lymphadenopathy.  Indeterminate intramammary lymph node in the right breast at 9:00.  New right breast skin and trabecular thickening, related to vascular congestion from enlarged lymph nodes in the right axilla. - Right axillary lymph node core biopsy (02/20/2022): Morphology and IHC compatible with primary breast cancer with differential diagnosis of urothelial carcinoma and primary lung carcinoma (squamous).  Grade 3.  ER 0%, PR 0%, HER2 2+, Ki-67 60%, HER2 negative by FISH. - Right breast lymph node biopsy at 9:00 (02/20/2022): Negative for carcinoma. - MRI breast (03/02/2022): In the upper outer right breast, mid to posterior depth there is a patchy clumped non-mass enhancement in a linear orientation spanning approximately 3.8 cm.  There is diffuse thickening of the skin in the right breast with skin enhancement.  Left breast with no mass or abnormal enhancement.  Numerous bulky matted lymph nodes in the right axilla. - Right breast UOQ biopsy (03/19/2022): Benign breast with fibrocystic changes including stromal fibrosis, adenosis, usual ductal  hyperplasia.  Negative for carcinoma. - PET scan (03/22/2022): Bulky hypermetabolic right axillary and subpectoral lymphadenopathy.  No other definite new sites of metastatic disease.  6 mm right lower lobe lung nodule with no FDG uptake.       PLAN:  1.  Triple negative right breast cancer: - She has tolerated cycle 1 of pembrolizumab, carboplatin and weekly paclitaxel very well. - No immunotherapy related side effects or peripheral neuropathy. - Physical examination today shows mild improvement in the erythema and mass in the right breast.  Axillary lymphadenopathy has improved in the right side. - Reviewed labs today which showed normal LFTs.  CBC shows mild thrombocytopenia with platelets of 119. - Proceed with cycle 2 with carboplatin and pembrolizumab as well as weekly dose of paclitaxel. - RTC 3 weeks for follow-up.  2.  Hypomagnesemia: - She is taking magnesium twice daily.  Magnesium level is 1.5.  She will receive IV magnesium. - Increase magnesium to 3 times daily.      Orders placed this encounter:  Orders Placed This Encounter  Procedures   CBC with Differential ([place])   CMP ([place] only)   CBC with Differential ([place] Only)   CMP ([place] only)   CBC with Differential ([place] Only)   CMP ([place] only)   CBC with Differential   Comprehensive metabolic panel   Magnesium      Derek Jack, MD [place] [phone]

## 2022-04-24 NOTE — Patient Instructions (Signed)
[place] at [facility] Discharge Instructions   You were seen and examined today by Dr. Delton Coombes.  He reviewed the results of your labs. Your magnesium is low at 1.5. Increase your magnesium to three times a day. We will also give you IV magnesium in the clinic today.  All other labs were normal/stable.   We will proceed with your treatment today.  Return as scheduled.    Thank you for choosing [place] at [facility] to provide your oncology and hematology care.  To afford each patient quality time with our provider, please arrive at least 15 minutes before your scheduled appointment time.   If you have a lab appointment with the [facility] please come in thru the Main Entrance and check in at the main information desk.  You need to re-schedule your appointment should you arrive 10 or more minutes late.  We strive to give you quality time with our providers, and arriving late affects you and other patients whose appointments are after yours.  Also, if you no show three or more times for appointments you may be dismissed from the clinic at the providers discretion.     Again, thank you for choosing [facility].  Our hope is that these requests will decrease the amount of time that you wait before being seen by our physicians.       _____________________________________________________________  Should you have questions after your visit to [facility], please contact our office at [phone] and follow the prompts.  Our office hours are 8:00 a.m. and 4:30 p.m. Monday - Friday.  Please note that voicemails left after 4:00 p.m. may not be returned until the following business day.  We are closed weekends and major holidays.  You do have access to a nurse 24-7, just call the main number to the clinic [phone] and do not press any options, hold on the line and a nurse will answer the phone.    For prescription  refill requests, have your pharmacy contact our office and allow 72 hours.    Due to Covid, you will need to wear a mask upon entering the hospital. If you do not have a mask, a mask will be given to you at the Main Entrance upon arrival. For doctor visits, patients may have 1 support person age 42 or older with them. For treatment visits, patients can not have anyone with them due to social distancing guidelines and our immunocompromised population.

## 2022-04-24 NOTE — Progress Notes (Signed)
Patients port flushed without difficulty.  Good blood return noted with no bruising or swelling noted at site.  Patient remains accessed for chemotherapy treatment.  

## 2022-04-24 NOTE — Progress Notes (Signed)
Pt presents today for Keytruda, Taxol, and Carboplatin per provider's order. Vital signs and labs WNL for treatment. Pt' magnesium is 1.5 today , pt will receive 2g IV magnesium sulfate x 1 dose per Dr.K's standing order. Okay to proceed with treatment today per Dr.K.  Beryle Flock, Taxol, Carboplatin, and 2g IV magnesium sulfate  given today per MD orders. Tolerated infusion without adverse affects. Vital signs stable. No complaints at this time. Discharged from clinic ambulatory in stable condition. Alert and oriented x 3. F/U with [facility] as scheduled.

## 2022-04-26 DIAGNOSIS — I129 Hypertensive chronic kidney disease with stage 1 through stage 4 chronic kidney disease, or unspecified chronic kidney disease: Secondary | ICD-10-CM | POA: Diagnosis not present

## 2022-04-26 DIAGNOSIS — N182 Chronic kidney disease, stage 2 (mild): Secondary | ICD-10-CM | POA: Diagnosis not present

## 2022-04-26 DIAGNOSIS — E1122 Type 2 diabetes mellitus with diabetic chronic kidney disease: Secondary | ICD-10-CM | POA: Diagnosis not present

## 2022-04-26 DIAGNOSIS — I69351 Hemiplegia and hemiparesis following cerebral infarction affecting right dominant side: Secondary | ICD-10-CM | POA: Diagnosis not present

## 2022-04-26 DIAGNOSIS — F17211 Nicotine dependence, cigarettes, in remission: Secondary | ICD-10-CM | POA: Diagnosis not present

## 2022-04-26 DIAGNOSIS — K219 Gastro-esophageal reflux disease without esophagitis: Secondary | ICD-10-CM | POA: Diagnosis not present

## 2022-04-26 DIAGNOSIS — E1169 Type 2 diabetes mellitus with other specified complication: Secondary | ICD-10-CM | POA: Diagnosis not present

## 2022-04-26 DIAGNOSIS — E785 Hyperlipidemia, unspecified: Secondary | ICD-10-CM | POA: Diagnosis not present

## 2022-04-26 DIAGNOSIS — Z008 Encounter for other general examination: Secondary | ICD-10-CM | POA: Diagnosis not present

## 2022-04-26 DIAGNOSIS — Z6830 Body mass index (BMI) 30.0-30.9, adult: Secondary | ICD-10-CM | POA: Diagnosis not present

## 2022-04-26 DIAGNOSIS — D696 Thrombocytopenia, unspecified: Secondary | ICD-10-CM | POA: Diagnosis not present

## 2022-04-26 DIAGNOSIS — I700 Atherosclerosis of aorta: Secondary | ICD-10-CM | POA: Diagnosis not present

## 2022-04-26 DIAGNOSIS — Z79899 Other long term (current) drug therapy: Secondary | ICD-10-CM | POA: Diagnosis not present

## 2022-04-26 DIAGNOSIS — E669 Obesity, unspecified: Secondary | ICD-10-CM | POA: Diagnosis not present

## 2022-04-26 DIAGNOSIS — F419 Anxiety disorder, unspecified: Secondary | ICD-10-CM | POA: Diagnosis not present

## 2022-05-01 ENCOUNTER — Inpatient Hospital Stay: Payer: No Typology Code available for payment source

## 2022-05-01 VITALS — BP 121/69 | HR 96 | Temp 97.5°F | Resp 18 | Wt 165.4 lb

## 2022-05-01 VITALS — BP 97/56 | HR 93 | Temp 97.7°F | Resp 18

## 2022-05-01 DIAGNOSIS — C50811 Malignant neoplasm of overlapping sites of right female breast: Secondary | ICD-10-CM

## 2022-05-01 DIAGNOSIS — Z5112 Encounter for antineoplastic immunotherapy: Secondary | ICD-10-CM | POA: Diagnosis not present

## 2022-05-01 DIAGNOSIS — Z95828 Presence of other vascular implants and grafts: Secondary | ICD-10-CM

## 2022-05-01 LAB — COMPREHENSIVE METABOLIC PANEL
ALT: 36 U/L (ref 0–44)
AST: 29 U/L (ref 15–41)
Albumin: 3.8 g/dL (ref 3.5–5.0)
Alkaline Phosphatase: 110 U/L (ref 38–126)
Anion gap: 6 (ref 5–15)
BUN: 20 mg/dL (ref 8–23)
CO2: 28 mmol/L (ref 22–32)
Calcium: 9.6 mg/dL (ref 8.9–10.3)
Chloride: 105 mmol/L (ref 98–111)
Creatinine, Ser: 0.92 mg/dL (ref 0.44–1.00)
GFR, Estimated: >60 mL/min (ref >60)
Glucose, Bld: 178 mg/dL — ABNORMAL HIGH (ref 70–99)
Potassium: 4.2 mmol/L (ref 3.5–5.1)
Sodium: 139 mmol/L (ref 135–145)
Total Bilirubin: 0.6 mg/dL (ref 0.3–1.2)
Total Protein: 7 g/dL (ref 6.5–8.1)

## 2022-05-01 LAB — CBC WITH DIFFERENTIAL/PLATELET
Abs Immature Granulocytes: 0.03 10*3/uL (ref 0.00–0.07)
Basophils Absolute: 0.1 10*3/uL (ref 0.0–0.1)
Basophils Relative: 2 %
Eosinophils Absolute: 0.1 10*3/uL (ref 0.0–0.5)
Eosinophils Relative: 2 %
HCT: 27.8 % — ABNORMAL LOW (ref 36.0–46.0)
Hemoglobin: 9.6 g/dL — ABNORMAL LOW (ref 12.0–15.0)
Immature Granulocytes: 1 %
Lymphocytes Relative: 27 %
Lymphs Abs: 0.8 10*3/uL (ref 0.7–4.0)
MCH: 30.8 pg (ref 26.0–34.0)
MCHC: 34.5 g/dL (ref 30.0–36.0)
MCV: 89.1 fL (ref 80.0–100.0)
Monocytes Absolute: 0.2 10*3/uL (ref 0.1–1.0)
Monocytes Relative: 6 %
Neutro Abs: 1.9 10*3/uL (ref 1.7–7.7)
Neutrophils Relative %: 62 %
Platelets: 145 10*3/uL — ABNORMAL LOW (ref 150–400)
RBC: 3.12 MIL/uL — ABNORMAL LOW (ref 3.87–5.11)
RDW: 15.3 % (ref 11.5–15.5)
WBC: 3 10*3/uL — ABNORMAL LOW (ref 4.0–10.5)
nRBC: 0 % (ref 0.0–0.2)

## 2022-05-01 LAB — MAGNESIUM: Magnesium: 1.8 mg/dL (ref 1.7–2.4)

## 2022-05-01 MED ORDER — SODIUM CHLORIDE 0.9% FLUSH: 10.0000 mL | INTRAVENOUS | Status: DC | PRN

## 2022-05-01 MED ORDER — SODIUM CHLORIDE 0.9 % IV SOLN: Freq: Once | INTRAVENOUS | Status: AC

## 2022-05-01 MED ORDER — DIPHENHYDRAMINE HCL 50 MG/ML IJ SOLN
25.0000 mg | Freq: Once | INTRAMUSCULAR | Status: AC
  Administered 2022-05-01: 25 mg via INTRAVENOUS
  Filled 2022-05-01: qty 1

## 2022-05-01 MED ORDER — FAMOTIDINE IN NACL 20-0.9 MG/50ML-% IV SOLN
20.0000 mg | Freq: Once | INTRAVENOUS | Status: AC
  Administered 2022-05-01: 20 mg via INTRAVENOUS
  Filled 2022-05-01: qty 50

## 2022-05-01 MED ORDER — SODIUM CHLORIDE 0.9 % IV SOLN
80.0000 mg/m2 | Freq: Once | INTRAVENOUS | Status: AC
  Administered 2022-05-01: 150 mg via INTRAVENOUS
  Filled 2022-05-01: qty 25

## 2022-05-01 MED ORDER — SODIUM CHLORIDE 0.9% FLUSH
10.0000 mL | Freq: Once | INTRAVENOUS | Status: AC
  Administered 2022-05-01: 10 mL via INTRAVENOUS

## 2022-05-01 MED ORDER — HEPARIN SOD (PORK) LOCK FLUSH 100 UNIT/ML IV SOLN: 500.0000 [IU] | Freq: Once | INTRAVENOUS | Status: DC | PRN

## 2022-05-01 NOTE — Patient Instructions (Signed)
[facility]  Discharge Instructions: Thank you for choosing [facility] to provide your oncology and hematology care.  If you have a lab appointment with the [facility], please come in thru the Main Entrance and check in at the main information desk.  Wear comfortable clothing and clothing appropriate for easy access to any Portacath or PICC line.   We strive to give you quality time with your provider. You may need to reschedule your appointment if you arrive late (15 or more minutes).  Arriving late affects you and other patients whose appointments are after yours.  Also, if you miss three or more appointments without notifying the office, you may be dismissed from the clinic at the provider's discretion.      For prescription refill requests, have your pharmacy contact our office and allow 72 hours for refills to be completed.    Today you received the following chemotherapy and/or immunotherapy agents Taxol   To help prevent nausea and vomiting after your treatment, we encourage you to take your nausea medication as directed.  Paclitaxel Injection What is this medication? PACLITAXEL (PAK li TAX el) treats some types of cancer. It works by slowing down the growth of cancer cells. This medicine may be used for other purposes; ask your health care provider or pharmacist if you have questions. COMMON BRAND NAME(S): Onxol, Taxol What should I tell my care team before I take this medication? They need to know if you have any of these conditions: Heart disease Liver disease Low white blood cell levels An unusual or allergic reaction to paclitaxel, other medications, foods, dyes, or preservatives If you or your partner are pregnant or trying to get pregnant Breast-feeding How should I use this medication? This medication is injected into a vein. It is given by your care team in a hospital or clinic setting. Talk to your care team about the use of this  medication in children. While it may be given to children for selected conditions, precautions do apply. Overdosage: If you think you have taken too much of this medicine contact a poison control center or emergency room at once. NOTE: This medicine is only for you. Do not share this medicine with others. What if I miss a dose? Keep appointments for follow-up doses. It is important not to miss your dose. Call your care team if you are unable to keep an appointment. What may interact with this medication? Do not take this medication with any of the following: Live virus vaccines Other medications may affect the way this medication works. Talk with your care team about all of the medications you take. They may suggest changes to your treatment plan to lower the risk of side effects and to make sure your medications work as intended. This list may not describe all possible interactions. Give your health care provider a list of all the medicines, herbs, non-prescription drugs, or dietary supplements you use. Also tell them if you smoke, drink alcohol, or use illegal drugs. Some items may interact with your medicine. What should I watch for while using this medication? Your condition will be monitored carefully while you are receiving this medication. You may need blood work while taking this medication. This medication may make you feel generally unwell. This is not uncommon as chemotherapy can affect healthy cells as well as cancer cells. Report any side effects. Continue your course of treatment even though you feel ill unless your care team tells you to  stop. This medication can cause serious allergic reactions. To reduce the risk, your care team may give you other medications to take before receiving this one. Be sure to follow the directions from your care team. This medication may increase your risk of getting an infection. Call your care team for advice if you get a fever, chills, sore throat, or  other symptoms of a cold or flu. Do not treat yourself. Try to avoid being around people who are sick. This medication may increase your risk to bruise or bleed. Call your care team if you notice any unusual bleeding. Be careful brushing or flossing your teeth or using a toothpick because you may get an infection or bleed more easily. If you have any dental work done, tell your dentist you are receiving this medication. Talk to your care team if you may be pregnant. Serious birth defects can occur if you take this medication during pregnancy. Talk to your care team before breastfeeding. Changes to your treatment plan may be needed. What side effects may I notice from receiving this medication? Side effects that you should report to your care team as soon as possible: Allergic reactions--skin rash, itching, hives, swelling of the face, lips, tongue, or throat Heart rhythm changes--fast or irregular heartbeat, dizziness, feeling faint or lightheaded, chest pain, trouble breathing Increase in blood pressure Infection--fever, chills, cough, sore throat, wounds that don't heal, pain or trouble when passing urine, general feeling of discomfort or being unwell Low blood pressure--dizziness, feeling faint or lightheaded, blurry vision Low red blood cell level--unusual weakness or fatigue, dizziness, headache, trouble breathing Painful swelling, warmth, or redness of the skin, blisters or sores at the infusion site Pain, tingling, or numbness in the hands or feet Slow heartbeat--dizziness, feeling faint or lightheaded, confusion, trouble breathing, unusual weakness or fatigue Unusual bruising or bleeding Side effects that usually do not require medical attention (report to your care team if they continue or are bothersome): Diarrhea Hair loss Joint pain Loss of appetite Muscle pain Nausea Vomiting This list may not describe all possible side effects. Call your doctor for medical advice about side  effects. You may report side effects to FDA at 1-800-FDA-1088. Where should I keep my medication? This medication is given in a hospital or clinic. It will not be stored at home. NOTE: This sheet is a summary. It may not cover all possible information. If you have questions about this medicine, talk to your doctor, pharmacist, or health care provider.  2023 Elsevier/Gold Standard (2021-08-23 00:00:00)   BELOW ARE SYMPTOMS THAT SHOULD BE REPORTED IMMEDIATELY: *FEVER GREATER THAN 100.4 F (38 C) OR HIGHER *CHILLS OR SWEATING *NAUSEA AND VOMITING THAT IS NOT CONTROLLED WITH YOUR NAUSEA MEDICATION *UNUSUAL SHORTNESS OF BREATH *UNUSUAL BRUISING OR BLEEDING *URINARY PROBLEMS (pain or burning when urinating, or frequent urination) *BOWEL PROBLEMS (unusual diarrhea, constipation, pain near the anus) TENDERNESS IN MOUTH AND THROAT WITH OR WITHOUT PRESENCE OF ULCERS (sore throat, sores in mouth, or a toothache) UNUSUAL RASH, SWELLING OR PAIN  UNUSUAL VAGINAL DISCHARGE OR ITCHING   Items with * indicate a potential emergency and should be followed up as soon as possible or go to the Emergency Department if any problems should occur.  Please show the CHEMOTHERAPY ALERT CARD or IMMUNOTHERAPY ALERT CARD at check-in to the Emergency Department and triage nurse.  Should you have questions after your visit or need to cancel or reschedule your appointment, please contact [facility] [phone]  and follow the prompts.  Office hours are 8:00 a.m. to 4:30 p.m. Monday - Friday. Please note that voicemails left after 4:00 p.m. may not be returned until the following business day.  We are closed weekends and major holidays. You have access to a nurse at all times for urgent questions. Please call the main number to the clinic [phone] and follow the prompts.  For any non-urgent questions, you may also contact your provider using MyChart. We now offer e-Visits for anyone 51 and older to  request care online for non-urgent symptoms. For details visit [email or web address].   Also download the MyChart app! Go to the app store, search "MyChart", open the app, select [facility], and log in with your MyChart username and password.  Masks are optional in the cancer centers. If you would like for your care team to wear a mask while they are taking care of you, please let them know. You may have one support person who is at least 61 years old accompany you for your appointments.

## 2022-05-01 NOTE — Progress Notes (Signed)
Pt presents today for Taxol per provider's order. Vital signs and labs WNL for treatment. Okay to proceed with treatment today.  Taxol given today per MD orders. Tolerated infusion without adverse affects. Vital signs stable. No complaints at this time. Discharged from clinic ambulatory in stable condition. Alert and oriented x 3. F/U with [facility] as scheduled.

## 2022-05-04 ENCOUNTER — Encounter: Payer: Self-pay | Admitting: Hematology

## 2022-05-07 HISTORY — PX: MASTECTOMY: SHX3

## 2022-05-08 ENCOUNTER — Inpatient Hospital Stay: Payer: No Typology Code available for payment source

## 2022-05-08 ENCOUNTER — Inpatient Hospital Stay: Payer: No Typology Code available for payment source | Attending: Hematology and Oncology

## 2022-05-08 VITALS — BP 126/74 | HR 86 | Temp 98.0°F | Resp 18 | Wt 160.4 lb

## 2022-05-08 DIAGNOSIS — T451X5A Adverse effect of antineoplastic and immunosuppressive drugs, initial encounter: Secondary | ICD-10-CM | POA: Insufficient documentation

## 2022-05-08 DIAGNOSIS — Z171 Estrogen receptor negative status [ER-]: Secondary | ICD-10-CM | POA: Diagnosis not present

## 2022-05-08 DIAGNOSIS — Z95828 Presence of other vascular implants and grafts: Secondary | ICD-10-CM

## 2022-05-08 DIAGNOSIS — Z5112 Encounter for antineoplastic immunotherapy: Secondary | ICD-10-CM | POA: Diagnosis not present

## 2022-05-08 DIAGNOSIS — D61811 Other drug-induced pancytopenia: Secondary | ICD-10-CM | POA: Insufficient documentation

## 2022-05-08 DIAGNOSIS — Z5111 Encounter for antineoplastic chemotherapy: Secondary | ICD-10-CM | POA: Insufficient documentation

## 2022-05-08 DIAGNOSIS — Z79899 Other long term (current) drug therapy: Secondary | ICD-10-CM | POA: Insufficient documentation

## 2022-05-08 DIAGNOSIS — Z5189 Encounter for other specified aftercare: Secondary | ICD-10-CM | POA: Diagnosis not present

## 2022-05-08 DIAGNOSIS — C50811 Malignant neoplasm of overlapping sites of right female breast: Secondary | ICD-10-CM | POA: Diagnosis not present

## 2022-05-08 DIAGNOSIS — D702 Other drug-induced agranulocytosis: Secondary | ICD-10-CM

## 2022-05-08 LAB — CBC WITH DIFFERENTIAL/PLATELET
Abs Immature Granulocytes: 0.01 10*3/uL (ref 0.00–0.07)
Basophils Absolute: 0 10*3/uL (ref 0.0–0.1)
Basophils Relative: 1 %
Eosinophils Absolute: 0.1 10*3/uL (ref 0.0–0.5)
Eosinophils Relative: 4 %
HCT: 26.6 % — ABNORMAL LOW (ref 36.0–46.0)
Hemoglobin: 8.9 g/dL — ABNORMAL LOW (ref 12.0–15.0)
Immature Granulocytes: 1 %
Lymphocytes Relative: 48 %
Lymphs Abs: 1 10*3/uL (ref 0.7–4.0)
MCH: 30.7 pg (ref 26.0–34.0)
MCHC: 33.5 g/dL (ref 30.0–36.0)
MCV: 91.7 fL (ref 80.0–100.0)
Monocytes Absolute: 0.4 10*3/uL (ref 0.1–1.0)
Monocytes Relative: 18 %
Neutro Abs: 0.6 10*3/uL — ABNORMAL LOW (ref 1.7–7.7)
Neutrophils Relative %: 28 %
Platelets: 211 10*3/uL (ref 150–400)
RBC: 2.9 MIL/uL — ABNORMAL LOW (ref 3.87–5.11)
RDW: 17 % — ABNORMAL HIGH (ref 11.5–15.5)
WBC: 2 10*3/uL — ABNORMAL LOW (ref 4.0–10.5)
nRBC: 1 % — ABNORMAL HIGH (ref 0.0–0.2)

## 2022-05-08 LAB — COMPREHENSIVE METABOLIC PANEL
ALT: 45 U/L — ABNORMAL HIGH (ref 0–44)
AST: 32 U/L (ref 15–41)
Albumin: 3.8 g/dL (ref 3.5–5.0)
Alkaline Phosphatase: 108 U/L (ref 38–126)
Anion gap: 6 (ref 5–15)
BUN: 15 mg/dL (ref 8–23)
CO2: 26 mmol/L (ref 22–32)
Calcium: 9 mg/dL (ref 8.9–10.3)
Chloride: 105 mmol/L (ref 98–111)
Creatinine, Ser: 0.87 mg/dL (ref 0.44–1.00)
GFR, Estimated: >60 mL/min (ref >60)
Glucose, Bld: 132 mg/dL — ABNORMAL HIGH (ref 70–99)
Potassium: 4.3 mmol/L (ref 3.5–5.1)
Sodium: 137 mmol/L (ref 135–145)
Total Bilirubin: 0.9 mg/dL (ref 0.3–1.2)
Total Protein: 6.9 g/dL (ref 6.5–8.1)

## 2022-05-08 LAB — MAGNESIUM: Magnesium: 2 mg/dL (ref 1.7–2.4)

## 2022-05-08 MED ORDER — SODIUM CHLORIDE 0.9 % IV SOLN: Freq: Once | INTRAVENOUS | Status: DC

## 2022-05-08 MED ORDER — FAMOTIDINE IN NACL 20-0.9 MG/50ML-% IV SOLN: 20.0000 mg | Freq: Once | INTRAVENOUS | Status: DC

## 2022-05-08 MED ORDER — SODIUM CHLORIDE 0.9% FLUSH
10.0000 mL | Freq: Once | INTRAVENOUS | Status: AC
  Administered 2022-05-08: 10 mL via INTRAVENOUS

## 2022-05-08 MED ORDER — SODIUM CHLORIDE 0.9 % IV SOLN: 80.0000 mg/m2 | Freq: Once | INTRAVENOUS | Status: DC

## 2022-05-08 MED ORDER — FILGRASTIM-AYOW 480 MCG/0.8ML ~~LOC~~ SOSY
480.0000 ug | PREFILLED_SYRINGE | Freq: Once | SUBCUTANEOUS | Status: AC
  Administered 2022-05-08: 480 ug via SUBCUTANEOUS
  Filled 2022-05-08: qty 0.8

## 2022-05-08 MED ORDER — DIPHENHYDRAMINE HCL 50 MG/ML IJ SOLN: 25.0000 mg | Freq: Once | INTRAMUSCULAR | Status: DC

## 2022-05-08 MED ORDER — HEPARIN SOD (PORK) LOCK FLUSH 100 UNIT/ML IV SOLN
500.0000 [IU] | Freq: Once | INTRAVENOUS | Status: AC | PRN
  Administered 2022-05-08: 500 [IU]

## 2022-05-08 MED ORDER — SODIUM CHLORIDE 0.9% FLUSH
10.0000 mL | INTRAVENOUS | Status: DC | PRN
  Administered 2022-05-08: 10 mL

## 2022-05-08 NOTE — Patient Instructions (Signed)
[facility]  Discharge Instructions: Thank you for choosing [facility] to provide your oncology and hematology care.  If you have a lab appointment with the [facility], please come in thru the Main Entrance and check in at the main information desk.  Wear comfortable clothing and clothing appropriate for easy access to any Portacath or PICC line.   We strive to give you quality time with your provider. You may need to reschedule your appointment if you arrive late (15 or more minutes).  Arriving late affects you and other patients whose appointments are after yours.  Also, if you miss three or more appointments without notifying the office, you may be dismissed from the clinic at the provider's discretion.      For prescription refill requests, have your pharmacy contact our office and allow 72 hours for refills to be completed.    Today you received the following chemotherapy and/or immunotherapy agents Releuko injection. Filgrastim Injection What is this medication? FILGRASTIM (fil GRA stim) lowers the risk of infection in people who are receiving chemotherapy. It works by Building control surveyor make more white blood cells, which protects your body from infection. It may also be used to help people who have been exposed to high doses of radiation. It can be used to help prepare your body before a stem cell transplant. It works by helping your bone marrow make and release stem cells into the blood. This medicine may be used for other purposes; ask your health care provider or pharmacist if you have questions. COMMON BRAND NAME(S): Neupogen, Nivestym, Releuko, Zarxio What should I tell my care team before I take this medication? They need to know if you have any of these conditions: History of blood diseases, such as sickle cell anemia Kidney disease Recent or ongoing radiation An unusual or allergic reaction to filgrastim, pegfilgrastim, latex, rubber, other  medications, foods, dyes, or preservatives Pregnant or trying to get pregnant Breast-feeding How should I use this medication? This medication is injected under the skin or into a vein. It is usually given by your care team in a hospital or clinic setting. It may be given at home. If you get this medication at home, you will be taught how to prepare and give it. Use exactly as directed. Take it as directed on the prescription label at the same time every day. Keep taking it unless your care team tells you to stop. It is important that you put your used needles and syringes in a special sharps container. Do not put them in a trash can. If you do not have a sharps container, call your pharmacist or care team to get one. This medication comes with INSTRUCTIONS FOR USE. Ask your pharmacist for directions on how to use this medication. Read the information carefully. Talk to your pharmacist or care team if you have questions. Talk to your care team about the use of this medication in children. While it may be prescribed for children for selected conditions, precautions do apply. Overdosage: If you think you have taken too much of this medicine contact a poison control center or emergency room at once. NOTE: This medicine is only for you. Do not share this medicine with others. What if I miss a dose? It is important not to miss any doses. Talk to your care team about what to do if you miss a dose. What may interact with this medication? Medications that may cause a release of neutrophils,  such as lithium This list may not describe all possible interactions. Give your health care provider a list of all the medicines, herbs, non-prescription drugs, or dietary supplements you use. Also tell them if you smoke, drink alcohol, or use illegal drugs. Some items may interact with your medicine. What should I watch for while using this medication? Your condition will be monitored carefully while you are receiving  this medication. You may need bloodwork while taking this medication. Talk to your care team about your risk of cancer. You may be more at risk for certain types of cancer if you take this medication. What side effects may I notice from receiving this medication? Side effects that you should report to your care team as soon as possible: Allergic reactions--skin rash, itching, hives, swelling of the face, lips, tongue, or throat Capillary leak syndrome--stomach or muscle pain, unusual weakness or fatigue, feeling faint or lightheaded, decrease in the amount of urine, swelling of the ankles, hands, or feet, trouble breathing High white blood cell level--fever, fatigue, trouble breathing, night sweats, change in vision, weight loss Inflammation of the aorta--fever, fatigue, back, chest, or stomach pain, severe headache Kidney injury (glomerulonephritis)--decrease in the amount of urine, red or dark brown urine, foamy or bubbly urine, swelling of the ankles, hands, or feet Shortness of breath or trouble breathing Spleen injury--pain in upper left stomach or shoulder Unusual bruising or bleeding Side effects that usually do not require medical attention (report to your care team if they continue or are bothersome): Back pain Bone pain Fatigue Fever Headache Nausea This list may not describe all possible side effects. Call your doctor for medical advice about side effects. You may report side effects to FDA at 1-800-FDA-1088. Where should I keep my medication? Keep out of the reach of children and pets. Keep this medication in the original packaging until you are ready to take it. Protect from light. See product for storage information. Each product may have different instructions. Get rid of any unused medication after the expiration date. To get rid of medications that are no longer needed or have expired: Take the medication to a medications take-back program. Check with your pharmacy or law  enforcement to find a location. If you cannot return the medication, ask your pharmacist or care team how to get rid of this medication safely. NOTE: This sheet is a summary. It may not cover all possible information. If you have questions about this medicine, talk to your doctor, pharmacist, or health care provider.  2023 Elsevier/Gold Standard (2021-08-01 00:00:00)       To help prevent nausea and vomiting after your treatment, we encourage you to take your nausea medication as directed.  BELOW ARE SYMPTOMS THAT SHOULD BE REPORTED IMMEDIATELY: *FEVER GREATER THAN 100.4 F (38 C) OR HIGHER *CHILLS OR SWEATING *NAUSEA AND VOMITING THAT IS NOT CONTROLLED WITH YOUR NAUSEA MEDICATION *UNUSUAL SHORTNESS OF BREATH *UNUSUAL BRUISING OR BLEEDING *URINARY PROBLEMS (pain or burning when urinating, or frequent urination) *BOWEL PROBLEMS (unusual diarrhea, constipation, pain near the anus) TENDERNESS IN MOUTH AND THROAT WITH OR WITHOUT PRESENCE OF ULCERS (sore throat, sores in mouth, or a toothache) UNUSUAL RASH, SWELLING OR PAIN  UNUSUAL VAGINAL DISCHARGE OR ITCHING   Items with * indicate a potential emergency and should be followed up as soon as possible or go to the Emergency Department if any problems should occur.  Please show the CHEMOTHERAPY ALERT CARD or IMMUNOTHERAPY ALERT CARD at check-in to the Emergency Department and triage nurse.  Should you have questions after your visit or need to cancel or reschedule your appointment, please contact [facility] [phone]  and follow the prompts.  Office hours are 8:00 a.m. to 4:30 p.m. Monday - Friday. Please note that voicemails left after 4:00 p.m. may not be returned until the following business day.  We are closed weekends and major holidays. You have access to a nurse at all times for urgent questions. Please call the main number to the clinic [phone] and follow the prompts.  For any non-urgent questions, you may  also contact your provider using MyChart. We now offer e-Visits for anyone 59 and older to request care online for non-urgent symptoms. For details visit [email or web address].   Also download the MyChart app! Go to the app store, search "MyChart", open the app, select [facility], and log in with your MyChart username and password.

## 2022-05-08 NOTE — Progress Notes (Signed)
Patient presents today for Taxol treatment. Vital signs within parameters for treatment. Labs pending.   ANC 0.6. Treatment held today. Releuko 480 mg injection given. Patient to return tomorrow for repeat cbc/d and possible treatment. Scheduling notified and patient aware of appointments. Understanding verbalized.   Releuko 480 mcg injection given today per MD orders. Tolerated  without adverse affects. Vital signs stable. No complaints at this time. Discharged from clinic ambulatory in stable condition. Alert and oriented x 3. F/U with [facility] as scheduled.

## 2022-05-09 ENCOUNTER — Telehealth: Payer: Self-pay

## 2022-05-09 ENCOUNTER — Inpatient Hospital Stay: Payer: No Typology Code available for payment source

## 2022-05-09 ENCOUNTER — Other Ambulatory Visit: Payer: Self-pay

## 2022-05-09 VITALS — BP 116/66 | HR 94 | Temp 97.6°F | Resp 18

## 2022-05-09 VITALS — BP 133/74 | HR 92 | Temp 97.8°F | Resp 18

## 2022-05-09 DIAGNOSIS — D702 Other drug-induced agranulocytosis: Secondary | ICD-10-CM

## 2022-05-09 DIAGNOSIS — Z171 Estrogen receptor negative status [ER-]: Secondary | ICD-10-CM

## 2022-05-09 DIAGNOSIS — Z5112 Encounter for antineoplastic immunotherapy: Secondary | ICD-10-CM | POA: Diagnosis not present

## 2022-05-09 DIAGNOSIS — Z95828 Presence of other vascular implants and grafts: Secondary | ICD-10-CM

## 2022-05-09 LAB — CBC WITH DIFFERENTIAL/PLATELET
Abs Immature Granulocytes: 0 10*3/uL (ref 0.00–0.07)
Band Neutrophils: 8 %
Basophils Absolute: 0 10*3/uL (ref 0.0–0.1)
Basophils Relative: 0 %
Eosinophils Absolute: 0.2 10*3/uL (ref 0.0–0.5)
Eosinophils Relative: 5 %
HCT: 27.7 % — ABNORMAL LOW (ref 36.0–46.0)
Hemoglobin: 9.3 g/dL — ABNORMAL LOW (ref 12.0–15.0)
Lymphocytes Relative: 16 %
Lymphs Abs: 0.8 10*3/uL (ref 0.7–4.0)
MCH: 31.4 pg (ref 26.0–34.0)
MCHC: 33.6 g/dL (ref 30.0–36.0)
MCV: 93.6 fL (ref 80.0–100.0)
Metamyelocytes Relative: 1 %
Monocytes Absolute: 0.6 10*3/uL (ref 0.1–1.0)
Monocytes Relative: 13 %
Neutro Abs: 3.2 10*3/uL (ref 1.7–7.7)
Neutrophils Relative %: 57 %
Platelets: 227 10*3/uL (ref 150–400)
RBC: 2.96 MIL/uL — ABNORMAL LOW (ref 3.87–5.11)
RDW: 18.5 % — ABNORMAL HIGH (ref 11.5–15.5)
WBC: 4.9 10*3/uL (ref 4.0–10.5)
nRBC: 1 % — ABNORMAL HIGH (ref 0.0–0.2)

## 2022-05-09 MED ORDER — CETIRIZINE HCL 10 MG/ML IV SOLN
10.0000 mg | Freq: Once | INTRAVENOUS | Status: AC
  Administered 2022-05-09: 10 mg via INTRAVENOUS
  Filled 2022-05-09: qty 1

## 2022-05-09 MED ORDER — SODIUM CHLORIDE 0.9% FLUSH
10.0000 mL | INTRAVENOUS | Status: DC | PRN
  Administered 2022-05-09: 10 mL via INTRAVENOUS

## 2022-05-09 MED ORDER — HEPARIN SOD (PORK) LOCK FLUSH 100 UNIT/ML IV SOLN
500.0000 [IU] | Freq: Once | INTRAVENOUS | Status: AC | PRN
  Administered 2022-05-09: 500 [IU]

## 2022-05-09 MED ORDER — FAMOTIDINE IN NACL 20-0.9 MG/50ML-% IV SOLN
20.0000 mg | Freq: Once | INTRAVENOUS | Status: AC
  Administered 2022-05-09: 20 mg via INTRAVENOUS
  Filled 2022-05-09: qty 50

## 2022-05-09 MED ORDER — SODIUM CHLORIDE 0.9 % IV SOLN: Freq: Once | INTRAVENOUS | Status: AC

## 2022-05-09 MED ORDER — SODIUM CHLORIDE 0.9% FLUSH
10.0000 mL | INTRAVENOUS | Status: DC | PRN
  Administered 2022-05-09: 10 mL

## 2022-05-09 MED ORDER — SODIUM CHLORIDE 0.9 % IV SOLN
80.0000 mg/m2 | Freq: Once | INTRAVENOUS | Status: AC
  Administered 2022-05-09: 150 mg via INTRAVENOUS
  Filled 2022-05-09: qty 25

## 2022-05-09 NOTE — Progress Notes (Addendum)
Pt presents today for Taxol per provider's order. Vital signs and labs WNL for treatment. Pt's WBC is 4.9 ANC is pending. ANC resulted and noted to be 3.2 Dr.K made aware and stated pt is okay to proceed with treatment today.  Taxol given today per MD orders. Tolerated infusion without adverse affects. Vital signs stable. No complaints at this time. Discharged from clinic ambulatory in stable condition. Alert and oriented x 3. F/U with [facility] as scheduled.

## 2022-05-09 NOTE — Telephone Encounter (Signed)
This has been handled by Derek Jack, MD per Renda Rolls, RN.

## 2022-05-09 NOTE — Progress Notes (Signed)
Patients port flushed without difficulty.  Good blood return noted with no bruising or swelling noted at site.  Patient remains accessed for chemotherapy treatment.  

## 2022-05-09 NOTE — Patient Instructions (Signed)
[facility]  Discharge Instructions: Thank you for choosing [facility] to provide your oncology and hematology care.  If you have a lab appointment with the [facility], please come in thru the Main Entrance and check in at the main information desk.  Wear comfortable clothing and clothing appropriate for easy access to any Portacath or PICC line.   We strive to give you quality time with your provider. You may need to reschedule your appointment if you arrive late (15 or more minutes).  Arriving late affects you and other patients whose appointments are after yours.  Also, if you miss three or more appointments without notifying the office, you may be dismissed from the clinic at the provider's discretion.      For prescription refill requests, have your pharmacy contact our office and allow 72 hours for refills to be completed.    Today you received the following chemotherapy and/or immunotherapy agents Taxol   To help prevent nausea and vomiting after your treatment, we encourage you to take your nausea medication as directed.  Paclitaxel Injection What is this medication? PACLITAXEL (PAK li TAX el) treats some types of cancer. It works by slowing down the growth of cancer cells. This medicine may be used for other purposes; ask your health care provider or pharmacist if you have questions. COMMON BRAND NAME(S): Onxol, Taxol What should I tell my care team before I take this medication? They need to know if you have any of these conditions: Heart disease Liver disease Low white blood cell levels An unusual or allergic reaction to paclitaxel, other medications, foods, dyes, or preservatives If you or your partner are pregnant or trying to get pregnant Breast-feeding How should I use this medication? This medication is injected into a vein. It is given by your care team in a hospital or clinic setting. Talk to your care team about the use of this  medication in children. While it may be given to children for selected conditions, precautions do apply. Overdosage: If you think you have taken too much of this medicine contact a poison control center or emergency room at once. NOTE: This medicine is only for you. Do not share this medicine with others. What if I miss a dose? Keep appointments for follow-up doses. It is important not to miss your dose. Call your care team if you are unable to keep an appointment. What may interact with this medication? Do not take this medication with any of the following: Live virus vaccines Other medications may affect the way this medication works. Talk with your care team about all of the medications you take. They may suggest changes to your treatment plan to lower the risk of side effects and to make sure your medications work as intended. This list may not describe all possible interactions. Give your health care provider a list of all the medicines, herbs, non-prescription drugs, or dietary supplements you use. Also tell them if you smoke, drink alcohol, or use illegal drugs. Some items may interact with your medicine. What should I watch for while using this medication? Your condition will be monitored carefully while you are receiving this medication. You may need blood work while taking this medication. This medication may make you feel generally unwell. This is not uncommon as chemotherapy can affect healthy cells as well as cancer cells. Report any side effects. Continue your course of treatment even though you feel ill unless your care team tells you to  stop. This medication can cause serious allergic reactions. To reduce the risk, your care team may give you other medications to take before receiving this one. Be sure to follow the directions from your care team. This medication may increase your risk of getting an infection. Call your care team for advice if you get a fever, chills, sore throat, or  other symptoms of a cold or flu. Do not treat yourself. Try to avoid being around people who are sick. This medication may increase your risk to bruise or bleed. Call your care team if you notice any unusual bleeding. Be careful brushing or flossing your teeth or using a toothpick because you may get an infection or bleed more easily. If you have any dental work done, tell your dentist you are receiving this medication. Talk to your care team if you may be pregnant. Serious birth defects can occur if you take this medication during pregnancy. Talk to your care team before breastfeeding. Changes to your treatment plan may be needed. What side effects may I notice from receiving this medication? Side effects that you should report to your care team as soon as possible: Allergic reactions--skin rash, itching, hives, swelling of the face, lips, tongue, or throat Heart rhythm changes--fast or irregular heartbeat, dizziness, feeling faint or lightheaded, chest pain, trouble breathing Increase in blood pressure Infection--fever, chills, cough, sore throat, wounds that don't heal, pain or trouble when passing urine, general feeling of discomfort or being unwell Low blood pressure--dizziness, feeling faint or lightheaded, blurry vision Low red blood cell level--unusual weakness or fatigue, dizziness, headache, trouble breathing Painful swelling, warmth, or redness of the skin, blisters or sores at the infusion site Pain, tingling, or numbness in the hands or feet Slow heartbeat--dizziness, feeling faint or lightheaded, confusion, trouble breathing, unusual weakness or fatigue Unusual bruising or bleeding Side effects that usually do not require medical attention (report to your care team if they continue or are bothersome): Diarrhea Hair loss Joint pain Loss of appetite Muscle pain Nausea Vomiting This list may not describe all possible side effects. Call your doctor for medical advice about side  effects. You may report side effects to FDA at 1-800-FDA-1088. Where should I keep my medication? This medication is given in a hospital or clinic. It will not be stored at home. NOTE: This sheet is a summary. It may not cover all possible information. If you have questions about this medicine, talk to your doctor, pharmacist, or health care provider.  2023 Elsevier/Gold Standard (2021-08-23 00:00:00)   BELOW ARE SYMPTOMS THAT SHOULD BE REPORTED IMMEDIATELY: *FEVER GREATER THAN 100.4 F (38 C) OR HIGHER *CHILLS OR SWEATING *NAUSEA AND VOMITING THAT IS NOT CONTROLLED WITH YOUR NAUSEA MEDICATION *UNUSUAL SHORTNESS OF BREATH *UNUSUAL BRUISING OR BLEEDING *URINARY PROBLEMS (pain or burning when urinating, or frequent urination) *BOWEL PROBLEMS (unusual diarrhea, constipation, pain near the anus) TENDERNESS IN MOUTH AND THROAT WITH OR WITHOUT PRESENCE OF ULCERS (sore throat, sores in mouth, or a toothache) UNUSUAL RASH, SWELLING OR PAIN  UNUSUAL VAGINAL DISCHARGE OR ITCHING   Items with * indicate a potential emergency and should be followed up as soon as possible or go to the Emergency Department if any problems should occur.  Please show the CHEMOTHERAPY ALERT CARD or IMMUNOTHERAPY ALERT CARD at check-in to the Emergency Department and triage nurse.  Should you have questions after your visit or need to cancel or reschedule your appointment, please contact [facility] [phone]  and follow the prompts.  Office hours are 8:00 a.m. to 4:30 p.m. Monday - Friday. Please note that voicemails left after 4:00 p.m. may not be returned until the following business day.  We are closed weekends and major holidays. You have access to a nurse at all times for urgent questions. Please call the main number to the clinic [phone] and follow the prompts.  For any non-urgent questions, you may also contact your provider using MyChart. We now offer e-Visits for anyone 55 and older to  request care online for non-urgent symptoms. For details visit [email or web address].   Also download the MyChart app! Go to the app store, search "MyChart", open the app, select [facility], and log in with your MyChart username and password.

## 2022-05-10 ENCOUNTER — Encounter: Payer: Self-pay | Admitting: Hematology

## 2022-05-10 ENCOUNTER — Inpatient Hospital Stay: Payer: No Typology Code available for payment source

## 2022-05-10 VITALS — BP 134/71 | HR 95 | Temp 98.3°F | Resp 18

## 2022-05-10 DIAGNOSIS — Z5112 Encounter for antineoplastic immunotherapy: Secondary | ICD-10-CM | POA: Diagnosis not present

## 2022-05-10 DIAGNOSIS — C50811 Malignant neoplasm of overlapping sites of right female breast: Secondary | ICD-10-CM

## 2022-05-10 MED ORDER — FILGRASTIM-AYOW 480 MCG/0.8ML ~~LOC~~ SOSY
480.0000 ug | PREFILLED_SYRINGE | Freq: Once | SUBCUTANEOUS | Status: AC
  Administered 2022-05-10: 480 ug via SUBCUTANEOUS
  Filled 2022-05-10: qty 0.8

## 2022-05-10 NOTE — Patient Instructions (Signed)
[facility]  Discharge Instructions: Thank you for choosing [facility] to provide your oncology and hematology care.  If you have a lab appointment with the [facility], please come in thru the Main Entrance and check in at the main information desk.  Wear comfortable clothing and clothing appropriate for easy access to any Portacath or PICC line.   We strive to give you quality time with your provider. You may need to reschedule your appointment if you arrive late (15 or more minutes).  Arriving late affects you and other patients whose appointments are after yours.  Also, if you miss three or more appointments without notifying the office, you may be dismissed from the clinic at the provider's discretion.      For prescription refill requests, have your pharmacy contact our office and allow 72 hours for refills to be completed.    Today you received Releuko injection.        BELOW ARE SYMPTOMS THAT SHOULD BE REPORTED IMMEDIATELY: *FEVER GREATER THAN 100.4 F (38 C) OR HIGHER *CHILLS OR SWEATING *NAUSEA AND VOMITING THAT IS NOT CONTROLLED WITH YOUR NAUSEA MEDICATION *UNUSUAL SHORTNESS OF BREATH *UNUSUAL BRUISING OR BLEEDING *URINARY PROBLEMS (pain or burning when urinating, or frequent urination) *BOWEL PROBLEMS (unusual diarrhea, constipation, pain near the anus) TENDERNESS IN MOUTH AND THROAT WITH OR WITHOUT PRESENCE OF ULCERS (sore throat, sores in mouth, or a toothache) UNUSUAL RASH, SWELLING OR PAIN  UNUSUAL VAGINAL DISCHARGE OR ITCHING   Items with * indicate a potential emergency and should be followed up as soon as possible or go to the Emergency Department if any problems should occur.  Please show the CHEMOTHERAPY ALERT CARD or IMMUNOTHERAPY ALERT CARD at check-in to the Emergency Department and triage nurse.  Should you have questions after your visit or need to cancel or reschedule your appointment, please contact [facility] [phone]  and follow the prompts.  Office hours are 8:00 a.m. to 4:30 p.m. Monday - Friday. Please note that voicemails left after 4:00 p.m. may not be returned until the following business day.  We are closed weekends and major holidays. You have access to a nurse at all times for urgent questions. Please call the main number to the clinic [phone] and follow the prompts.  For any non-urgent questions, you may also contact your provider using MyChart. We now offer e-Visits for anyone 22 and older to request care online for non-urgent symptoms. For details visit [email or web address].   Also download the MyChart app! Go to the app store, search "MyChart", open the app, select [facility], and log in with your MyChart username and password.

## 2022-05-10 NOTE — Progress Notes (Signed)
[name] presents today for injection per the provider's orders.  Releuko administration without incident; injection site WNL; see MAR for injection details.  Patient tolerated procedure well and without incident.  No questions or complaints noted at this time.   Discharged from clinic ambulatory in stable condition. Alert and oriented x 3. F/U with [facility] as scheduled.

## 2022-05-10 NOTE — Addendum Note (Signed)
Addended by: Derek Jack on: 05/10/2022 12:40 PM   Modules accepted: Orders

## 2022-05-11 ENCOUNTER — Inpatient Hospital Stay: Payer: No Typology Code available for payment source

## 2022-05-11 VITALS — BP 120/73 | HR 94 | Temp 96.7°F | Resp 18

## 2022-05-11 DIAGNOSIS — Z5112 Encounter for antineoplastic immunotherapy: Secondary | ICD-10-CM | POA: Diagnosis not present

## 2022-05-11 DIAGNOSIS — Z171 Estrogen receptor negative status [ER-]: Secondary | ICD-10-CM

## 2022-05-11 MED ORDER — FILGRASTIM-AYOW 480 MCG/0.8ML ~~LOC~~ SOSY
480.0000 ug | PREFILLED_SYRINGE | Freq: Once | SUBCUTANEOUS | Status: AC
  Administered 2022-05-11: 480 ug via SUBCUTANEOUS
  Filled 2022-05-11: qty 0.8

## 2022-05-11 NOTE — Progress Notes (Signed)
Patient tolerated Releuko injection with no complaints voiced.  Site clean and dry with no bruising or swelling noted.  No complaints of pain.  Discharged with vital signs stable and no signs or symptoms of distress noted.

## 2022-05-11 NOTE — Patient Instructions (Signed)
[facility]  Discharge Instructions: Thank you for choosing [facility] to provide your oncology and hematology care.  If you have a lab appointment with the [facility], please come in thru the Main Entrance and check in at the main information desk.  Wear comfortable clothing and clothing appropriate for easy access to any Portacath or PICC line.   We strive to give you quality time with your provider. You may need to reschedule your appointment if you arrive late (15 or more minutes).  Arriving late affects you and other patients whose appointments are after yours.  Also, if you miss three or more appointments without notifying the office, you may be dismissed from the clinic at the provider's discretion.      For prescription refill requests, have your pharmacy contact our office and allow 72 hours for refills to be completed.    Today you received the following chemotherapy and/or immunotherapy agents Releuko.  Filgrastim Injection What is this medication? FILGRASTIM (fil GRA stim) lowers the risk of infection in people who are receiving chemotherapy. It works by Building control surveyor make more white blood cells, which protects your body from infection. It may also be used to help people who have been exposed to high doses of radiation. It can be used to help prepare your body before a stem cell transplant. It works by helping your bone marrow make and release stem cells into the blood. This medicine may be used for other purposes; ask your health care provider or pharmacist if you have questions. COMMON BRAND NAME(S): Neupogen, Nivestym, Releuko, Zarxio What should I tell my care team before I take this medication? They need to know if you have any of these conditions: History of blood diseases, such as sickle cell anemia Kidney disease Recent or ongoing radiation An unusual or allergic reaction to filgrastim, pegfilgrastim, latex, rubber, other  medications, foods, dyes, or preservatives Pregnant or trying to get pregnant Breast-feeding How should I use this medication? This medication is injected under the skin or into a vein. It is usually given by your care team in a hospital or clinic setting. It may be given at home. If you get this medication at home, you will be taught how to prepare and give it. Use exactly as directed. Take it as directed on the prescription label at the same time every day. Keep taking it unless your care team tells you to stop. It is important that you put your used needles and syringes in a special sharps container. Do not put them in a trash can. If you do not have a sharps container, call your pharmacist or care team to get one. This medication comes with INSTRUCTIONS FOR USE. Ask your pharmacist for directions on how to use this medication. Read the information carefully. Talk to your pharmacist or care team if you have questions. Talk to your care team about the use of this medication in children. While it may be prescribed for children for selected conditions, precautions do apply. Overdosage: If you think you have taken too much of this medicine contact a poison control center or emergency room at once. NOTE: This medicine is only for you. Do not share this medicine with others. What if I miss a dose? It is important not to miss any doses. Talk to your care team about what to do if you miss a dose. What may interact with this medication? Medications that may cause a release of neutrophils,  such as lithium This list may not describe all possible interactions. Give your health care provider a list of all the medicines, herbs, non-prescription drugs, or dietary supplements you use. Also tell them if you smoke, drink alcohol, or use illegal drugs. Some items may interact with your medicine. What should I watch for while using this medication? Your condition will be monitored carefully while you are receiving  this medication. You may need bloodwork while taking this medication. Talk to your care team about your risk of cancer. You may be more at risk for certain types of cancer if you take this medication. What side effects may I notice from receiving this medication? Side effects that you should report to your care team as soon as possible: Allergic reactions--skin rash, itching, hives, swelling of the face, lips, tongue, or throat Capillary leak syndrome--stomach or muscle pain, unusual weakness or fatigue, feeling faint or lightheaded, decrease in the amount of urine, swelling of the ankles, hands, or feet, trouble breathing High white blood cell level--fever, fatigue, trouble breathing, night sweats, change in vision, weight loss Inflammation of the aorta--fever, fatigue, back, chest, or stomach pain, severe headache Kidney injury (glomerulonephritis)--decrease in the amount of urine, red or dark [place] urine, foamy or bubbly urine, swelling of the ankles, hands, or feet Shortness of breath or trouble breathing Spleen injury--pain in upper left stomach or shoulder Unusual bruising or bleeding Side effects that usually do not require medical attention (report to your care team if they continue or are bothersome): Back pain Bone pain Fatigue Fever Headache Nausea This list may not describe all possible side effects. Call your doctor for medical advice about side effects. You may report side effects to FDA at 1-800-FDA-1088. Where should I keep my medication? Keep out of the reach of children and pets. Keep this medication in the original packaging until you are ready to take it. Protect from light. See product for storage information. Each product may have different instructions. Get rid of any unused medication after the expiration date. To get rid of medications that are no longer needed or have expired: Take the medication to a medications take-back program. Check with your pharmacy or law  enforcement to find a location. If you cannot return the medication, ask your pharmacist or care team how to get rid of this medication safely. NOTE: This sheet is a summary. It may not cover all possible information. If you have questions about this medicine, talk to your doctor, pharmacist, or health care provider.  2023 Elsevier/Gold Standard (2021-08-01 00:00:00)        To help prevent nausea and vomiting after your treatment, we encourage you to take your nausea medication as directed.  BELOW ARE SYMPTOMS THAT SHOULD BE REPORTED IMMEDIATELY: *FEVER GREATER THAN 100.4 F (38 C) OR HIGHER *CHILLS OR SWEATING *NAUSEA AND VOMITING THAT IS NOT CONTROLLED WITH YOUR NAUSEA MEDICATION *UNUSUAL SHORTNESS OF BREATH *UNUSUAL BRUISING OR BLEEDING *URINARY PROBLEMS (pain or burning when urinating, or frequent urination) *BOWEL PROBLEMS (unusual diarrhea, constipation, pain near the anus) TENDERNESS IN MOUTH AND THROAT WITH OR WITHOUT PRESENCE OF ULCERS (sore throat, sores in mouth, or a toothache) UNUSUAL RASH, SWELLING OR PAIN  UNUSUAL VAGINAL DISCHARGE OR ITCHING   Items with * indicate a potential emergency and should be followed up as soon as possible or go to the Emergency Department if any problems should occur.  Please show the CHEMOTHERAPY ALERT CARD or IMMUNOTHERAPY ALERT CARD at check-in to the Emergency Department and triage nurse.  Should you have questions after your visit or need to cancel or reschedule your appointment, please contact [facility] [phone]  and follow the prompts.  Office hours are 8:00 a.m. to 4:30 p.m. Monday - Friday. Please note that voicemails left after 4:00 p.m. may not be returned until the following business day.  We are closed weekends and major holidays. You have access to a nurse at all times for urgent questions. Please call the main number to the clinic [phone] and follow the prompts.  For any non-urgent questions, you  may also contact your provider using MyChart. We now offer e-Visits for anyone 29 and older to request care online for non-urgent symptoms. For details visit [email or web address].   Also download the MyChart app! Go to the app store, search "MyChart", open the app, select [facility], and log in with your MyChart username and password.

## 2022-05-14 ENCOUNTER — Inpatient Hospital Stay: Payer: No Typology Code available for payment source

## 2022-05-14 ENCOUNTER — Inpatient Hospital Stay (HOSPITAL_BASED_OUTPATIENT_CLINIC_OR_DEPARTMENT_OTHER): Payer: No Typology Code available for payment source | Admitting: Hematology

## 2022-05-14 ENCOUNTER — Other Ambulatory Visit: Payer: Self-pay

## 2022-05-14 VITALS — BP 123/67 | HR 97 | Temp 97.7°F | Resp 18 | Ht 63.0 in | Wt 157.6 lb

## 2022-05-14 DIAGNOSIS — Z171 Estrogen receptor negative status [ER-]: Secondary | ICD-10-CM

## 2022-05-14 DIAGNOSIS — C50811 Malignant neoplasm of overlapping sites of right female breast: Secondary | ICD-10-CM | POA: Diagnosis not present

## 2022-05-14 DIAGNOSIS — Z5112 Encounter for antineoplastic immunotherapy: Secondary | ICD-10-CM | POA: Diagnosis not present

## 2022-05-14 DIAGNOSIS — Z95828 Presence of other vascular implants and grafts: Secondary | ICD-10-CM

## 2022-05-14 LAB — CBC WITH DIFFERENTIAL/PLATELET
Abs Immature Granulocytes: 0 10*3/uL (ref 0.00–0.07)
Basophils Absolute: 0 10*3/uL (ref 0.0–0.1)
Basophils Relative: 1 %
Eosinophils Absolute: 0 10*3/uL (ref 0.0–0.5)
Eosinophils Relative: 1 %
HCT: 26.5 % — ABNORMAL LOW (ref 36.0–46.0)
Hemoglobin: 9 g/dL — ABNORMAL LOW (ref 12.0–15.0)
Lymphocytes Relative: 64 %
Lymphs Abs: 0.9 10*3/uL (ref 0.7–4.0)
MCH: 31.4 pg (ref 26.0–34.0)
MCHC: 34 g/dL (ref 30.0–36.0)
MCV: 92.3 fL (ref 80.0–100.0)
Monocytes Absolute: 0.1 10*3/uL (ref 0.1–1.0)
Monocytes Relative: 5 %
Neutro Abs: 0.4 10*3/uL — CL (ref 1.7–7.7)
Neutrophils Relative %: 29 %
Platelets: 128 10*3/uL — ABNORMAL LOW (ref 150–400)
RBC: 2.87 MIL/uL — ABNORMAL LOW (ref 3.87–5.11)
RDW: 19 % — ABNORMAL HIGH (ref 11.5–15.5)
WBC: 1.4 10*3/uL — CL (ref 4.0–10.5)
nRBC: 0 % (ref 0.0–0.2)

## 2022-05-14 LAB — COMPREHENSIVE METABOLIC PANEL
ALT: 45 U/L — ABNORMAL HIGH (ref 0–44)
AST: 35 U/L (ref 15–41)
Albumin: 3.9 g/dL (ref 3.5–5.0)
Alkaline Phosphatase: 117 U/L (ref 38–126)
Anion gap: 12 (ref 5–15)
BUN: 15 mg/dL (ref 8–23)
CO2: 24 mmol/L (ref 22–32)
Calcium: 9.9 mg/dL (ref 8.9–10.3)
Chloride: 98 mmol/L (ref 98–111)
Creatinine, Ser: 1.07 mg/dL — ABNORMAL HIGH (ref 0.44–1.00)
GFR, Estimated: 59 mL/min — ABNORMAL LOW (ref >60)
Glucose, Bld: 200 mg/dL — ABNORMAL HIGH (ref 70–99)
Potassium: 4.2 mmol/L (ref 3.5–5.1)
Sodium: 134 mmol/L — ABNORMAL LOW (ref 135–145)
Total Bilirubin: 0.6 mg/dL (ref 0.3–1.2)
Total Protein: 7.4 g/dL (ref 6.5–8.1)

## 2022-05-14 LAB — MAGNESIUM: Magnesium: 1.5 mg/dL — ABNORMAL LOW (ref 1.7–2.4)

## 2022-05-14 MED ORDER — SODIUM CHLORIDE 0.9% FLUSH
10.0000 mL | Freq: Once | INTRAVENOUS | Status: AC
  Administered 2022-05-14: 10 mL via INTRAVENOUS

## 2022-05-14 MED ORDER — HEPARIN SOD (PORK) LOCK FLUSH 100 UNIT/ML IV SOLN: 500.0000 [IU] | Freq: Once | INTRAVENOUS | Status: DC

## 2022-05-14 MED ORDER — FILGRASTIM-AYOW 480 MCG/0.8ML ~~LOC~~ SOSY
480.0000 ug | PREFILLED_SYRINGE | Freq: Once | SUBCUTANEOUS | Status: AC
  Administered 2022-05-14: 480 ug via SUBCUTANEOUS
  Filled 2022-05-14: qty 0.8

## 2022-05-14 NOTE — Patient Instructions (Addendum)
[facility] at [facility] Discharge Instructions   You were seen and examined today by Dr. Delton Coombes.  He reviewed the results of your lab work. Your white blood count is low. We will have to give you a white blood cell booster shot today. We will bring you back on Wednesday for treatment.   Return as scheduled.    Thank you for choosing [facility] at [facility] to provide your oncology and hematology care.  To afford each patient quality time with our provider, please arrive at least 15 minutes before your scheduled appointment time.   If you have a lab appointment with the [facility] please come in thru the Main Entrance and check in at the main information desk.  You need to re-schedule your appointment should you arrive 10 or more minutes late.  We strive to give you quality time with our providers, and arriving late affects you and other patients whose appointments are after yours.  Also, if you no show three or more times for appointments you may be dismissed from the clinic at the providers discretion.     Again, thank you for choosing [facility].  Our hope is that these requests will decrease the amount of time that you wait before being seen by our physicians.       _____________________________________________________________  Should you have questions after your visit to [facility], please contact our office at [phone] and follow the prompts.  Our office hours are 8:00 a.m. and 4:30 p.m. Monday - Friday.  Please note that voicemails left after 4:00 p.m. may not be returned until the following business day.  We are closed weekends and major holidays.  You do have access to a nurse 24-7, just call the main number to the clinic [phone] and do not press any options, hold on the line and a nurse will answer the phone.    For prescription refill requests, have your pharmacy contact our office and  allow 72 hours.    Due to Covid, you will need to wear a mask upon entering the hospital. If you do not have a mask, a mask will be given to you at the Main Entrance upon arrival. For doctor visits, patients may have 1 support person age 62 or older with them. For treatment visits, patients can not have anyone with them due to social distancing guidelines and our immunocompromised population.

## 2022-05-14 NOTE — Progress Notes (Unsigned)
CRITICAL VALUE ALERT Critical value received:  WBC 1.4 , ANC 0.4.  Date of notification:  05-14-2022. Time of notification: 12:36 pm.  Critical value read back:  Yes.   Nurse who received alert:  B.Sarai January RN.  MD notified time and response:  Dr. Delton Coombes @ 12:49 pm.

## 2022-05-14 NOTE — Progress Notes (Signed)
[place], [place] [address]   CLINIC:  Medical Oncology/Hematology  PCP:  Derek Jack, [place] [address] [phone]   REASON FOR VISIT:  Follow-up for locally advanced TNBC  PRIOR THERAPY: None  NGS Results: Not done  CURRENT THERAPY: Pembrolizumab, carboplatin and paclitaxel followed by pembrolizumab, Adriamycin and Cytoxan  BRIEF ONCOLOGIC HISTORY:  Oncology History Overview Note   Cancer Staging  Malignant neoplasm of overlapping sites of right female breast [facility]) Staging form: Breast, AJCC 8th Edition - Clinical stage from 03/08/2022: Stage IIIC (cT2, cN2, cM0, G3, ER-, PR-, HER2-) - Signed by Truitt Merle, MD on 03/08/2022    Malignant neoplasm of overlapping sites of right female breast [facility])  02/20/2022 Mammogram   CLINICAL DATA:  62 year old female recalled from screening mammography 03/01/2021 for right breast calcifications and subsequent benign discordant biopsy of these calcifications in the central posterior right breast December 2022 with excision recommended. The patient initially followed up with surgery in April however canceled her scheduled surgery and most recently followed up with Dr. [name] September 2023 with diagnostic imaging, possible RF tag placement and subsequent excision recommended.   EXAM: DIGITAL DIAGNOSTIC BILATERAL MAMMOGRAM WITH TOMOSYNTHESIS; ULTRASOUND RIGHT BREAST LIMITED  MPRESSION: 1.  Suspicious right axillary lymphadenopathy.   2. Indeterminate intramammary lymph node in the right breast at 9 o'clock.   3. 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   4. Decreased calcifications noted at prior benign biopsy site in the lower central posterior right breast at site of X shaped biopsy marking clip.   02/20/2022  Initial Biopsy   FINAL MICROSCOPIC DIAGNOSIS:   A. AXILLA, RIGHT, LYMPH NODE, NEEDLE CORE BIOPSY:  - Positive for carcinoma (see Comment)   B. LYMPH NODE, RIGHT BREAST, BIOPSY:  - Negative for carcinoma   COMMENT:  Part A: Morphology and immunohistochemical staining are most compatible with primary breast carcinoma with metaplastic changes, however differential diagnosis also includes urothelial carcinoma and less likely primary lung carcinoma (squamous).  No lymphoid tissue is identified.  Clinical and radiologic correlation is suggested.   ADDENDUM:  In case of a breast origin, the appropriate grade would be grade 3  (3+3+2)   ADDENDUM:  PROGNOSTIC INDICATOR RESULTS:  The tumor cells are EQUIVOCAL for Her2 (2+).  Estrogen Receptor:       0%, NEGATIVE  Progesterone Receptor:   0%, NEGATIVE  Proliferation Marker Ki-67:   60%   ADDENDUM:  FLOURESCENCE IN-SITU HYBRIDIZATION RESULTS:  GROUP 5:   HER2 **NEGATIVE**    03/02/2022 Imaging   EXAM: BILATERAL BREAST MRI WITH AND WITHOUT CONTRAST  IMPRESSION: 1. There is a suspicious 3.8 cm area of patchy non mass enhancement in a linear orientation in the slightly upper outer right breast in the mid to posterior depth spanning 3.8 cm.   2. Diffuse skin thickening with enhancement of the skin, concerning for inflammatory breast cancer.   3. Numerous bulky matted lymph nodes in the right axilla, one of which corresponds with the biopsy-proven metastatic lymph node.   4.  No evidence of left breast malignancy.   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 ([place])   03/08/2022 Cancer Staging   Staging form: Breast, AJCC 8th Edition - Clinical stage from 03/08/2022: Stage IIIC (cT4d, cN2, cM0, G3, ER-, PR-, HER2-) -  Signed by Truitt Merle, MD on 03/08/2022 Histologic grading system: 3 grade system   03/19/2022 Pathology Results   Diagnosis Breast, right, needle core biopsy, upper outer  quadrant, barbell clip BENIGN BREAST WITH FIBROCYSTIC CHANGES INCLUDING STROMAL FIBROSIS, ADENOSIS AND USUAL DUCT HYPERPLASIA BENIGN FIBROMATOID CHANGE NEGATIVE FOR MICROCALCIFICATIONS NEGATIVE FOR CARCINOMA   03/22/2022 PET scan   IMPRESSION: Bulky hypermetabolic right axillary and subpectoral lymphadenopathy, consistent with metastatic disease.   No other definite sites of metastatic disease identified.   6 mm right lower lobe pulmonary nodule shows no FDG uptake, but is too small to definitively characterize by PET. Recommend continued follow-up by chest CT in 3-4 months.   Aortic Atherosclerosis (ICD10-I70.0).   04/02/2022 -  Chemotherapy   Patient is on Treatment Plan : BREAST Pembrolizumab (200) D1 + Carboplatin (5) D1 + Paclitaxel (80) D1,8,15 q21d X 4 cycles / Pembrolizumab (200) D1 + AC D1 q21d x 4 cycles       CANCER STAGING:  Cancer Staging  Malignant neoplasm of overlapping sites of right female breast [facility]) Staging form: Breast, AJCC 8th Edition - Clinical stage from 03/08/2022: Stage IIIC (cT4d, cN2, cM0, G3, ER-, PR-, HER2-) - Signed by Truitt Merle, MD on 03/08/2022    INTERVAL HISTORY:  Ms. Bouska 62 y.o. female seen for follow-up of triple negative breast cancer and toxicity assessment prior to cycle 3 of chemotherapy.  She does not report any numbness or tingling in the extremities.  She had diarrhea for 1 day on Saturday.  Occasional nausea reported from Trulicity.  Reports some pains in the legs after G-CSF injection.  The last about 1 to 2 days.  Denies any fevers or infections.    REVIEW OF SYSTEMS:  Review of Systems  Gastrointestinal:  Positive for diarrhea.  All other systems reviewed and are negative.    PAST MEDICAL/SURGICAL HISTORY:  Past Medical History:  Diagnosis Date   Allergy    Anxiety    Breast cancer ([place])    Diabetes mellitus without complication ([place])    GERD (gastroesophageal reflux disease)    Heart murmur    as a child    History of kidney stones    Hypertension    Pneumonia    Stroke [facility])    Vaginal Pap smear, abnormal    Past Surgical History:  Procedure Laterality Date   CESAREAN SECTION     CHOLECYSTECTOMY     COLONOSCOPY WITH PROPOFOL N/A 07/19/2020   Procedure: COLONOSCOPY WITH PROPOFOL;  Surgeon: Harvel Quale, MD;  Location: [facility];  Service: Gastroenterology;  Laterality: N/A;  AM   POLYPECTOMY  07/19/2020   Procedure: POLYPECTOMY;  Surgeon: Harvel Quale, MD;  Location: [facility];  Service: Gastroenterology;;   PORTACATH PLACEMENT N/A 03/20/2022   Procedure: INSERTION PORT-A-CATH;  Surgeon: Coralie Keens, MD;  Location: [facility] OR[facility];  Service: General;  Laterality: N/A;     SOCIAL HISTORY:  Social History   Socioeconomic History   Marital status: Married    Spouse name: Jeneen Rinks   Number of children: 2   Years of education: 10   Highest education level: 10th grade  Occupational History   Occupation: disabled  Tobacco Use   Smoking status: Former    Packs/day: 0.50    Years: 25.00    Total pack years: 12.50    Types: Cigarettes    Start date: 12/08/1999    Quit date: 03/12/2015    Years since quitting: 7.1   Smokeless tobacco: Never  Vaping  Use   Vaping Use: Never used  Substance and Sexual Activity   Alcohol use: Yes    Alcohol/week: 2.0 - 3.0 standard drinks of alcohol    Types: 2 - 3 Glasses of wine per week   Drug use: No   Sexual activity: Not Currently    Birth control/protection: Abstinence, Post-menopausal  Other Topics Concern   Not on file  Social History Narrative   Volunteers at [employer] for a few hours every day.    She really enjoys getting out of of the house and working there.    Social Determinants of Health   Financial Resource Strain: Low Risk  (03/01/2022)   Overall Financial Resource Strain (CARDIA)    Difficulty of Paying Living Expenses: Not very hard  Food Insecurity: No Food Insecurity (04/09/2022)    Hunger Vital Sign    Worried About Running Out of Food in the Last Year: Never true    Ran Out of Food in the Last Year: Never true  Transportation Needs: No Transportation Needs (03/01/2022)   PRAPARE - Hydrologist (Medical): No    Lack of Transportation (Non-Medical): No  Physical Activity: Insufficiently Active (03/01/2022)   Exercise Vital Sign    Days of Exercise per Week: 1 day    Minutes of Exercise per Session: 10 min  Stress: Stress Concern Present (03/01/2022)   [place]    Feeling of Stress : Rather much  Social Connections: Moderately Isolated (03/01/2022)   Social Connection and Isolation Panel [NHANES]    Frequency of Communication with Friends and Family: Twice a week    Frequency of Social Gatherings with Friends and Family: Twice a week    Attends Religious Services: Never    Marine scientist or Organizations: No    Attends Archivist Meetings: Never    Marital Status: Married  Human resources officer Violence: Not At Risk (04/09/2022)   Humiliation, Afraid, Rape, and Kick questionnaire    Fear of Current or Ex-Partner: No    Emotionally Abused: No    Physically Abused: No    Sexually Abused: No    FAMILY HISTORY:  Family History  Problem Relation Age of Onset   Diabetes Mother    Cervical cancer Mother    Diabetes Brother    Breast cancer Maternal Aunt     CURRENT MEDICATIONS:  Outpatient Encounter Medications as of 05/14/2022  Medication Sig   Alcohol Swabs (B-D SINGLE USE SWABS REGULAR) PADS Test BS daily and as needed Dx E11.9   amLODipine (NORVASC) 10 MG tablet 1 tablet daily   Apple Cider Vinegar 500 MG TABS Take 500 mg by mouth in the morning.   aspirin 81 MG EC tablet Take 1 tablet (81 mg total) by mouth daily.   atorvastatin (LIPITOR) 80 MG tablet TAKE 1 TABLET EVERY DAY AT 6PM   Blood Glucose Calibration (TRUE METRIX LEVEL 1) Low SOLN Use  with glucometer Dx E11.9   Blood Glucose Monitoring Suppl (TRUE METRIX AIR GLUCOSE METER) w/Device KIT Test BS daily and as needed Dx E11.9   Calcium Carb-Cholecalciferol (CALCIUM-VITAMIN D) 600-400 MG-UNIT TABS Take 1 tablet by mouth in the morning.   clopidogrel (PLAVIX) 75 MG tablet Take 75 mg by mouth daily.   Coenzyme Q10 (COQ10) 100 MG CAPS Take 100 mg by mouth in the morning.   Cranberry 425 MG CAPS Take 425 mg by mouth in the morning and  at bedtime.   Dulaglutide (TRULICITY) 4.5 DE/0.8XK SOPN Inject 4.5 mg as directed once a week. (Patient taking differently: Inject 4.5 mg as directed every Saturday.)   Flaxseed, Linseed, (FLAXSEED OIL) 1000 MG CAPS Take 1,000 mg by mouth in the morning.   glucose blood (TRUE METRIX BLOOD GLUCOSE TEST) test strip Test BS daily and as needed Dx E11.9   icosapent Ethyl (VASCEPA) 1 g capsule Take 2 g by mouth 2 (two) times daily.   Krill Oil 500 MG CAPS Take 3 capsules (1,500 mg total) by mouth in the morning and at bedtime.   lidocaine-prilocaine (EMLA) cream Apply to affected area once   LORazepam (ATIVAN) 1 MG tablet Take 1 mg by mouth every 8 (eight) hours as needed.   magnesium oxide (MAG-OX) 400 (240 Mg) MG tablet Take 1 tablet (400 mg total) by mouth in the morning, at noon, and at bedtime.   meloxicam (MOBIC) 15 MG tablet Take 15 mg by mouth daily.   metoprolol (TOPROL-XL) 200 MG 24 hr tablet TAKE 1 TABLET ONE TIME DAILY, WITH OR IMMEDIATELY FOLLOWING A MEAL   Multiple Vitamin (MULTIVITAMIN) capsule Take 1 capsule by mouth in the morning.   ondansetron (ZOFRAN) 8 MG tablet Take 1 tablet (8 mg total) by mouth every 8 (eight) hours as needed for nausea or vomiting. Start on the third day after chemotherapy.   pantoprazole (PROTONIX) 40 MG tablet TAKE 1 TABLET twice daily FOR STOMACH   prochlorperazine (COMPAZINE) 10 MG tablet Take 1 tablet (10 mg total) by mouth every 6 (six) hours as needed for nausea or vomiting.   traMADol (ULTRAM) 50 MG tablet Take  1 tablet (50 mg total) by mouth every 6 (six) hours as needed for moderate pain or severe pain.   traZODone (DESYREL) 150 MG tablet TAKE 1 OR 2 TABLETS AT BEDTIME FOR SLEEP (Patient taking differently: Take 75 mg by mouth at bedtime.)   TRUEplus Lancets 33G MISC Test BS daily and as needed Dx E11.9   valsartan (DIOVAN) 320 MG tablet TAKE 1 TABLET (320 MG TOTAL) BY MOUTH DAILY. FOR BLOOD PRESSURE.   zinc gluconate 50 MG tablet Take 50 mg by mouth daily.   [EXPIRED] sodium chloride flush (NS) 0.9 % injection 10 mL    No facility-administered encounter medications on file as of 05/14/2022.    ALLERGIES:  Allergies  Allergen Reactions   Penicillins Shortness Of Breath   Sulfa Antibiotics Rash     PHYSICAL EXAM:  ECOG Performance status: 1  There were no vitals filed for this visit. There were no vitals filed for this visit. Physical Exam Vitals reviewed.  Constitutional:      Appearance: Normal appearance.  Cardiovascular:     Rate and Rhythm: Normal rate and regular rhythm.     Heart sounds: Normal heart sounds.  Pulmonary:     Effort: Pulmonary effort is normal.     Breath sounds: Normal breath sounds.  Abdominal:     Palpations: Abdomen is soft. There is no mass.  Neurological:     Mental Status: She is alert.  Psychiatric:        Mood and Affect: Mood normal.        Behavior: Behavior normal.     LABORATORY DATA:  I have reviewed the labs as listed.  CBC    Component Value Date/Time   WBC 1.4 (LL) 05/14/2022 1152   RBC 2.87 (L) 05/14/2022 1152   HGB 9.0 (L) 05/14/2022 1152   HGB 13.5 04/02/2022  0924   HGB 14.1 03/07/2022 0942   HCT 26.5 (L) 05/14/2022 1152   HCT 40.3 03/07/2022 0942   PLT 128 (L) 05/14/2022 1152   PLT 228 04/02/2022 0924   PLT 336 03/07/2022 0942   MCV 92.3 05/14/2022 1152   MCV 91 03/07/2022 0942   MCH 31.4 05/14/2022 1152   MCHC 34.0 05/14/2022 1152   RDW 19.0 (H) 05/14/2022 1152   RDW 12.6 03/07/2022 0942   LYMPHSABS 0.9 05/14/2022  1152   LYMPHSABS 1.4 03/07/2022 0942   MONOABS 0.1 05/14/2022 1152   EOSABS 0.0 05/14/2022 1152   EOSABS 0.2 03/07/2022 0942   BASOSABS 0.0 05/14/2022 1152   BASOSABS 0.1 03/07/2022 0942      Latest Ref Rng & Units 05/14/2022   11:52 AM 05/08/2022    1:07 PM 05/01/2022    8:10 AM  CMP  Glucose 70 - 99 mg/dL 200  132  178   BUN 8 - 23 mg/dL '15  15  20   '$ Creatinine 0.44 - 1.00 mg/dL 1.07  0.87  0.92   Sodium 135 - 145 mmol/L 134  137  139   Potassium 3.5 - 5.1 mmol/L 4.2  4.3  4.2   Chloride 98 - 111 mmol/L 98  105  105   CO2 22 - 32 mmol/L '24  26  28   '$ Calcium 8.9 - 10.3 mg/dL 9.9  9.0  9.6   Total Protein 6.5 - 8.1 g/dL 7.4  6.9  7.0   Total Bilirubin 0.3 - 1.2 mg/dL 0.6  0.9  0.6   Alkaline Phos 38 - 126 U/L 117  108  110   AST 15 - 41 U/L 35  32  29   ALT 0 - 44 U/L 45  45  36     DIAGNOSTIC IMAGING:  I have independently reviewed the scans and discussed with the patient.  ASSESSMENT: 1.  Inflammatory right breast cancer: - Bilateral diagnostic mammogram (02/20/2022): Suspicious right axillary lymphadenopathy.  Indeterminate intramammary lymph node in the right breast at 9:00.  New right breast skin and trabecular thickening, related to vascular congestion from enlarged lymph nodes in the right axilla. - Right axillary lymph node core biopsy (02/20/2022): Morphology and IHC compatible with primary breast cancer with differential diagnosis of urothelial carcinoma and primary lung carcinoma (squamous).  Grade 3.  ER 0%, PR 0%, HER2 2+, Ki-67 60%, HER2 negative by FISH. - Right breast lymph node biopsy at 9:00 (02/20/2022): Negative for carcinoma. - MRI breast (03/02/2022): In the upper outer right breast, mid to posterior depth there is a patchy clumped non-mass enhancement in a linear orientation spanning approximately 3.8 cm.  There is diffuse thickening of the skin in the right breast with skin enhancement.  Left breast with no mass or abnormal enhancement.  Numerous bulky matted  lymph nodes in the right axilla. - Right breast UOQ biopsy (03/19/2022): Benign breast with fibrocystic changes including stromal fibrosis, adenosis, usual ductal hyperplasia.  Negative for carcinoma. - PET scan (03/22/2022): Bulky hypermetabolic right axillary and subpectoral lymphadenopathy.  No other definite new sites of metastatic disease.  6 mm right lower lobe lung nodule with no FDG uptake.       PLAN:  1.  Triple negative right breast cancer: - She has completed 2 cycles of pembrolizumab, carboplatin and weekly paclitaxel. - She had some cytopenias requiring G-CSF injection. - Today white count is 1.4 with platelet count 128.  ANC 0.4. - Recommend holding treatment today. - Recommend  G-CSF 480 mcg today with CBC checked tomorrow.  She wants to have her treatment on 05/16/2022. - RTC 3 weeks for follow-up.  I will do breast exam at that time.  2.  Hypomagnesemia: - Magnesium is 1.5.  She is taking magnesium 1 tablet 3 times daily. - Increase magnesium to 2 tablets twice daily.      Orders placed this encounter:  No orders of the defined types were placed in this encounter.     Derek Jack, MD [place] [phone]

## 2022-05-14 NOTE — Progress Notes (Unsigned)
Treatment held today due to [facility], injection given per MD order. Will repeat labs tomorrow and poss treatment on Wednesday per MD.   Vitals stable and discharged home from clinic ambulatory. Follow up as scheduled.

## 2022-05-15 ENCOUNTER — Inpatient Hospital Stay: Payer: No Typology Code available for payment source

## 2022-05-15 ENCOUNTER — Encounter: Payer: Self-pay | Admitting: *Deleted

## 2022-05-15 DIAGNOSIS — C50811 Malignant neoplasm of overlapping sites of right female breast: Secondary | ICD-10-CM

## 2022-05-15 DIAGNOSIS — Z5112 Encounter for antineoplastic immunotherapy: Secondary | ICD-10-CM | POA: Diagnosis not present

## 2022-05-15 LAB — CBC WITH DIFFERENTIAL/PLATELET
Abs Immature Granulocytes: 0.2 10*3/uL — ABNORMAL HIGH (ref 0.00–0.07)
Basophils Absolute: 0 10*3/uL (ref 0.0–0.1)
Basophils Relative: 0 %
Eosinophils Absolute: 0 10*3/uL (ref 0.0–0.5)
Eosinophils Relative: 0 %
HCT: 26.4 % — ABNORMAL LOW (ref 36.0–46.0)
Hemoglobin: 9 g/dL — ABNORMAL LOW (ref 12.0–15.0)
Lymphocytes Relative: 47 %
Lymphs Abs: 1.4 10*3/uL (ref 0.7–4.0)
MCH: 31.7 pg (ref 26.0–34.0)
MCHC: 34.1 g/dL (ref 30.0–36.0)
MCV: 93 fL (ref 80.0–100.0)
Metamyelocytes Relative: 6 %
Monocytes Absolute: 0.1 10*3/uL (ref 0.1–1.0)
Monocytes Relative: 4 %
Myelocytes: 2 %
Neutro Abs: 1.2 10*3/uL — ABNORMAL LOW (ref 1.7–7.7)
Neutrophils Relative %: 41 %
Platelets: 136 10*3/uL — ABNORMAL LOW (ref 150–400)
RBC: 2.84 MIL/uL — ABNORMAL LOW (ref 3.87–5.11)
RDW: 19.9 % — ABNORMAL HIGH (ref 11.5–15.5)
WBC: 2.9 10*3/uL — ABNORMAL LOW (ref 4.0–10.5)
nRBC: 1 % — ABNORMAL HIGH (ref 0.0–0.2)

## 2022-05-15 MED ORDER — HEPARIN SOD (PORK) LOCK FLUSH 100 UNIT/ML IV SOLN
500.0000 [IU] | Freq: Once | INTRAVENOUS | Status: AC
  Administered 2022-05-15: 500 [IU] via INTRAVENOUS

## 2022-05-15 MED ORDER — SODIUM CHLORIDE 0.9% FLUSH
10.0000 mL | INTRAVENOUS | Status: DC | PRN
  Administered 2022-05-15: 10 mL via INTRAVENOUS

## 2022-05-15 NOTE — Addendum Note (Signed)
Addended by: Donetta Potts on: 05/15/2022 01:38 PM   Modules accepted: Orders

## 2022-05-15 NOTE — Progress Notes (Signed)
Pt's ANC today was 1.2 today, pt will return tomorrow for treatment. However on Day 1 parameters of ANC such be more than 1.5 and Day 8 and day 15  ANC such be 1.0  or above per Dr.K.

## 2022-05-15 NOTE — Progress Notes (Unsigned)
Labs from 05/15/22 - ANC 1.2 - ok to proceed with treatment on 05/26/22.    Treatment parameters for treatment day 1 updated to [place] 1.5 - for future doses after 05/16/22.  T.O. Dr Rhys Martini, PharmD

## 2022-05-16 ENCOUNTER — Other Ambulatory Visit: Payer: No Typology Code available for payment source

## 2022-05-16 ENCOUNTER — Inpatient Hospital Stay: Payer: No Typology Code available for payment source

## 2022-05-16 VITALS — BP 105/61 | HR 88 | Temp 96.4°F | Resp 18

## 2022-05-16 DIAGNOSIS — Z5112 Encounter for antineoplastic immunotherapy: Secondary | ICD-10-CM | POA: Diagnosis not present

## 2022-05-16 DIAGNOSIS — Z171 Estrogen receptor negative status [ER-]: Secondary | ICD-10-CM

## 2022-05-16 MED ORDER — HEPARIN SOD (PORK) LOCK FLUSH 100 UNIT/ML IV SOLN
500.0000 [IU] | Freq: Once | INTRAVENOUS | Status: AC | PRN
  Administered 2022-05-16: 500 [IU]

## 2022-05-16 MED ORDER — CETIRIZINE HCL 10 MG/ML IV SOLN
10.0000 mg | Freq: Once | INTRAVENOUS | Status: AC
  Administered 2022-05-16: 10 mg via INTRAVENOUS
  Filled 2022-05-16: qty 1

## 2022-05-16 MED ORDER — SODIUM CHLORIDE 0.9% FLUSH
10.0000 mL | INTRAVENOUS | Status: DC | PRN
  Administered 2022-05-16: 10 mL

## 2022-05-16 MED ORDER — SODIUM CHLORIDE 0.9 % IV SOLN
150.0000 mg | Freq: Once | INTRAVENOUS | Status: AC
  Administered 2022-05-16: 150 mg via INTRAVENOUS
  Filled 2022-05-16: qty 5

## 2022-05-16 MED ORDER — SODIUM CHLORIDE 0.9 % IV SOLN
468.5000 mg | Freq: Once | INTRAVENOUS | Status: AC
  Administered 2022-05-16: 450 mg via INTRAVENOUS
  Filled 2022-05-16: qty 45

## 2022-05-16 MED ORDER — SODIUM CHLORIDE 0.9 % IV SOLN
200.0000 mg | Freq: Once | INTRAVENOUS | Status: AC
  Administered 2022-05-16: 200 mg via INTRAVENOUS
  Filled 2022-05-16: qty 8

## 2022-05-16 MED ORDER — SODIUM CHLORIDE 0.9 % IV SOLN: Freq: Once | INTRAVENOUS | Status: AC

## 2022-05-16 MED ORDER — SODIUM CHLORIDE 0.9 % IV SOLN
80.0000 mg/m2 | Freq: Once | INTRAVENOUS | Status: AC
  Administered 2022-05-16: 150 mg via INTRAVENOUS
  Filled 2022-05-16: qty 25

## 2022-05-16 MED ORDER — FAMOTIDINE IN NACL 20-0.9 MG/50ML-% IV SOLN
20.0000 mg | Freq: Once | INTRAVENOUS | Status: AC
  Administered 2022-05-16: 20 mg via INTRAVENOUS
  Filled 2022-05-16: qty 50

## 2022-05-16 MED ORDER — PALONOSETRON HCL INJECTION 0.25 MG/5ML
0.2500 mg | Freq: Once | INTRAVENOUS | Status: AC
  Administered 2022-05-16: 0.25 mg via INTRAVENOUS
  Filled 2022-05-16: qty 5

## 2022-05-16 NOTE — Progress Notes (Signed)
Per Delton Coombes okay to proceed with treatment with ANC 1.2.  Patient will be getting Keytruda, Taxol and Carboplatin infusions today.    Treatment given today per MD orders.  Stable during infusion without adverse affects.  Vital signs stable.  No complaints at this time.  Discharge from clinic ambulatory in stable condition.  Alert and oriented X 3.  Follow up with [facility] as scheduled.

## 2022-05-16 NOTE — Patient Instructions (Signed)
[facility]  Discharge Instructions: Thank you for choosing [facility] to provide your oncology and hematology care.  If you have a lab appointment with the Cancer Center, please come in thru the Main Entrance and check in at the main information desk.  Wear comfortable clothing and clothing appropriate for easy access to any Portacath or PICC line.   We strive to give you quality time with your provider. You may need to reschedule your appointment if you arrive late (15 or more minutes).  Arriving late affects you and other patients whose appointments are after yours.  Also, if you miss three or more appointments without notifying the office, you may be dismissed from the clinic at the provider's discretion.      For prescription refill requests, have your pharmacy contact our office and allow 72 hours for refills to be completed.    Today you received the following chemotherapy and/or immunotherapy agents Keytruda/Taxol/Carboplatin      To help prevent nausea and vomiting after your treatment, we encourage you to take your nausea medication as directed.  BELOW ARE SYMPTOMS THAT SHOULD BE REPORTED IMMEDIATELY: *FEVER GREATER THAN 100.4 F (38 C) OR HIGHER *CHILLS OR SWEATING *NAUSEA AND VOMITING THAT IS NOT CONTROLLED WITH YOUR NAUSEA MEDICATION *UNUSUAL SHORTNESS OF BREATH *UNUSUAL BRUISING OR BLEEDING *URINARY PROBLEMS (pain or burning when urinating, or frequent urination) *BOWEL PROBLEMS (unusual diarrhea, constipation, pain near the anus) TENDERNESS IN MOUTH AND THROAT WITH OR WITHOUT PRESENCE OF ULCERS (sore throat, sores in mouth, or a toothache) UNUSUAL RASH, SWELLING OR PAIN  UNUSUAL VAGINAL DISCHARGE OR ITCHING   Items with * indicate a potential emergency and should be followed up as soon as possible or go to the Emergency Department if any problems should occur.  Please show the CHEMOTHERAPY ALERT CARD or IMMUNOTHERAPY ALERT CARD at  check-in to the Emergency Department and triage nurse.  Should you have questions after your visit or need to cancel or reschedule your appointment, please contact [facility] [phone]  and follow the prompts.  Office hours are 8:00 a.m. to 4:30 p.m. Monday - Friday. Please note that voicemails left after 4:00 p.m. may not be returned until the following business day.  We are closed weekends and major holidays. You have access to a nurse at all times for urgent questions. Please call the main number to the clinic [phone] and follow the prompts.  For any non-urgent questions, you may also contact your provider using MyChart. We now offer e-Visits for anyone 18 and older to request care online for non-urgent symptoms. For details visit [email or web address].   Also download the MyChart app! Go to the app store, search "MyChart", open the app, select [place], and log in with your MyChart username and password.   

## 2022-05-21 ENCOUNTER — Inpatient Hospital Stay: Payer: No Typology Code available for payment source

## 2022-05-21 ENCOUNTER — Other Ambulatory Visit: Payer: Self-pay | Admitting: Licensed Clinical Social Worker

## 2022-05-21 ENCOUNTER — Other Ambulatory Visit: Payer: Self-pay

## 2022-05-21 ENCOUNTER — Encounter: Payer: Self-pay | Admitting: Licensed Clinical Social Worker

## 2022-05-21 ENCOUNTER — Inpatient Hospital Stay: Payer: No Typology Code available for payment source | Admitting: Licensed Clinical Social Worker

## 2022-05-21 DIAGNOSIS — Z803 Family history of malignant neoplasm of breast: Secondary | ICD-10-CM

## 2022-05-21 DIAGNOSIS — Z171 Estrogen receptor negative status [ER-]: Secondary | ICD-10-CM

## 2022-05-21 DIAGNOSIS — Z8049 Family history of malignant neoplasm of other genital organs: Secondary | ICD-10-CM | POA: Diagnosis not present

## 2022-05-21 DIAGNOSIS — C50811 Malignant neoplasm of overlapping sites of right female breast: Secondary | ICD-10-CM | POA: Diagnosis not present

## 2022-05-21 NOTE — Progress Notes (Signed)
REFERRING PROVIDER: Truitt Merle, MD [address] [place],  [place] [address]  PRIMARY PROVIDER:  Derek Jack, MD  PRIMARY REASON FOR VISIT:  1. Malignant neoplasm of overlapping sites of right breast in female, estrogen receptor negative ([place])   2. Family history of breast cancer   3. Family history of uterine cancer    HISTORY OF PRESENT ILLNESS:   Marilyn Ford, a 62 y.o. female, was seen for a [facility] cancer genetics consultation at the request of Dr. Burr Medico due to a personal and family history of breast cancer.  Marilyn Ford presents to clinic today to discuss the possibility of a hereditary predisposition to cancer, genetic testing, and to further clarify her future cancer risks, as well as potential cancer risks for family members.   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 The treatment plan currently includes chemotherapy.  CANCER HISTORY:  Oncology History Overview Note   Cancer Staging  Malignant neoplasm of overlapping sites of right female breast [facility]) Staging form: Breast, AJCC 8th Edition - Clinical stage from 03/08/2022: Stage IIIC (cT2, cN2, cM0, G3, ER-, PR-, HER2-) - Signed by Truitt Merle, MD on 03/08/2022    Malignant neoplasm of overlapping sites of right female breast [facility])  02/20/2022 Mammogram   CLINICAL DATA:  62 year old female recalled from screening mammography 03/01/2021 for right breast calcifications and subsequent benign discordant biopsy of these calcifications in the central posterior right breast December 2022 with excision recommended. The patient initially followed up with surgery in April however canceled her scheduled surgery and most recently followed up with Dr. [name] September 2023 with diagnostic imaging, possible RF tag placement and subsequent excision recommended.   EXAM: DIGITAL DIAGNOSTIC BILATERAL MAMMOGRAM WITH  TOMOSYNTHESIS; ULTRASOUND RIGHT BREAST LIMITED  MPRESSION: 1.  Suspicious right axillary lymphadenopathy.   2. Indeterminate intramammary lymph node in the right breast at 9 o'clock.   3. 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   4. Decreased calcifications noted at prior benign biopsy site in the lower central posterior right breast at site of X shaped biopsy marking clip.   02/20/2022 Initial Biopsy   FINAL MICROSCOPIC DIAGNOSIS:   A. AXILLA, RIGHT, LYMPH NODE, NEEDLE CORE BIOPSY:  - Positive for carcinoma (see Comment)   B. LYMPH NODE, RIGHT BREAST, BIOPSY:  - Negative for carcinoma   COMMENT:  Part A: Morphology and immunohistochemical staining are most compatible with primary breast carcinoma with metaplastic changes, however differential diagnosis also includes urothelial carcinoma and less likely primary lung carcinoma (squamous).  No lymphoid tissue is identified.  Clinical and radiologic correlation is suggested.   ADDENDUM:  In case of a breast origin, the appropriate grade would be grade 3  (3+3+2)   ADDENDUM:  PROGNOSTIC INDICATOR RESULTS:  The tumor cells are EQUIVOCAL for Her2 (2+).  Estrogen Receptor:       0%, NEGATIVE  Progesterone Receptor:   0%, NEGATIVE  Proliferation Marker Ki-67:   60%   ADDENDUM:  FLOURESCENCE IN-SITU HYBRIDIZATION RESULTS:  GROUP 5:   HER2 **NEGATIVE**    03/02/2022 Imaging   EXAM: BILATERAL BREAST MRI WITH AND WITHOUT CONTRAST  IMPRESSION: 1. There is a suspicious 3.8 cm area of patchy non mass enhancement in a linear orientation in the slightly upper outer right breast in the mid to posterior  depth spanning 3.8 cm.   2. Diffuse skin thickening with enhancement of the skin, concerning for inflammatory breast cancer.   3. Numerous bulky matted lymph nodes in the right axilla,  one of which corresponds with the biopsy-proven metastatic lymph node.   4.  No evidence of left breast malignancy.   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 ([place])   03/08/2022 Cancer Staging   Staging form: Breast, AJCC 8th Edition - Clinical stage from 03/08/2022: Stage IIIC (cT4d, cN2, cM0, G3, ER-, PR-, HER2-) - Signed by Truitt Merle, MD on 03/08/2022 Histologic grading system: 3 grade system   03/19/2022 Pathology Results   Diagnosis Breast, right, needle core biopsy, upper outer quadrant, barbell clip BENIGN BREAST WITH FIBROCYSTIC CHANGES INCLUDING STROMAL FIBROSIS, ADENOSIS AND USUAL DUCT HYPERPLASIA BENIGN FIBROMATOID CHANGE NEGATIVE FOR MICROCALCIFICATIONS NEGATIVE FOR CARCINOMA   03/22/2022 PET scan   IMPRESSION: Bulky hypermetabolic right axillary and subpectoral lymphadenopathy, consistent with metastatic disease.   No other definite sites of metastatic disease identified.   6 mm right lower lobe pulmonary nodule shows no FDG uptake, but is too small to definitively characterize by PET. Recommend continued follow-up by chest CT in 3-4 months.   Aortic Atherosclerosis (ICD10-I70.0).   04/02/2022 -  Chemotherapy   Patient is on Treatment Plan : BREAST Pembrolizumab (200) D1 + Carboplatin (5) D1 + Paclitaxel (80) D1,8,15 q21d X 4 cycles / Pembrolizumab (200) D1 + AC D1 q21d x 4 cycles       RISK FACTORS:  Menarche was at age 44.  First live birth at age 34.  OCP use for approximately 0 years.  Ovaries intact: yes.  Hysterectomy: no.  Menopausal status: postmenopausal.  HRT use: 0 years. Colonoscopy: yes; normal. Mammogram within the last year: yes. Number of breast biopsies: 3  Past Medical History:  Diagnosis Date   Allergy    Anxiety    Breast cancer ([place])    Diabetes mellitus without complication ([place])    GERD (gastroesophageal reflux disease)    Heart murmur    as a child   History of kidney  stones    Hypertension    Pneumonia    Stroke [facility])    Vaginal Pap smear, abnormal     Past Surgical History:  Procedure Laterality Date   CESAREAN SECTION     CHOLECYSTECTOMY     COLONOSCOPY WITH PROPOFOL N/A 07/19/2020   Procedure: COLONOSCOPY WITH PROPOFOL;  Surgeon: Harvel Quale, MD;  Location: [facility];  Service: Gastroenterology;  Laterality: N/A;  AM   POLYPECTOMY  07/19/2020   Procedure: POLYPECTOMY;  Surgeon: Harvel Quale, MD;  Location: [facility];  Service: Gastroenterology;;   PORTACATH PLACEMENT N/A 03/20/2022   Procedure: INSERTION PORT-A-CATH;  Surgeon: Coralie Keens, MD;  Location: [facility] ORS;  Service: General;  Laterality: N/A;    FAMILY HISTORY:  We obtained a detailed, 4-generation family history.  Significant diagnoses are listed below: Family History  Problem Relation Age of Onset   Diabetes Mother    Cervical cancer Mother    Diabetes Brother    Breast cancer Maternal Aunt    Marilyn Ford has 1 son, 23, and 1 daughter, Marilyn Ford, 38. She has 3 maternal half sisters, 2 maternal half brothers, no cancers.  Marilyn Ford mother is living at 15, she had uterine cancer in her 60s-40s. Patient had 2 maternal aunts and 1 uncle. Her aunt had breast cancer over age 71 and passed from  it. Maternal grandmother had cancer, unknown type, possibly related to her back.   Marilyn Ford father died at 26. She has limited information about that side of the family, no known cancers.  Marilyn Ford is unaware of previous family history of genetic testing for hereditary cancer risks. There is no reported Ashkenazi Jewish ancestry. There is no known consanguinity.    GENETIC COUNSELING ASSESSMENT: Marilyn Ford is a 62 y.o. female with a personal and family history of breast cancer which is somewhat suggestive of a hereditary cancer syndrome and predisposition to cancer. We, therefore, discussed and recommended the following at today's visit.    DISCUSSION: We discussed that approximately 10% of breast cancer is hereditary. Most cases of hereditary breast cancer are associated with BRCA1/BRCA2 genes, although there are other genes associated with hereditary cancer as well. Cancers and risks are gene specific. We discussed that testing is beneficial for several reasons including knowing about cancer risks, identifying potential screening and risk-reduction options that may be appropriate, and to understand if other family members could be at risk for cancer and allow them to undergo genetic testing.   We reviewed the characteristics, features and inheritance patterns of hereditary cancer syndromes. We also discussed genetic testing, including the appropriate family members to test, the process of testing, insurance coverage and turn-around-time for results. We discussed the implications of a negative, positive and/or variant of uncertain significant result. We recommended Marilyn Ford pursue genetic testing for the [facility] Common Hereditary Cancers+RNA gene panel.   Based on the personal and family history of cancer, she meets medical criteria for genetic testing. Despite that she meets criteria, she may still have an out of pocket cost. We discussed that if her out of pocket cost for testing is over $100, the laboratory will call and confirm whether she wants to proceed with testing.  If the out of pocket cost of testing is less than $100 she will be billed by the genetic testing laboratory.   PLAN: After considering the risks, benefits, and limitations, Marilyn Ford provided informed consent to pursue genetic testing and the blood sample was sent to [facility] for analysis of the Common Hereditary Cancers+RNA panel. Results should be available within approximately 2-3 weeks' time, at which point they will be disclosed by telephone to Marilyn Ford, as will any additional recommendations warranted by these results. Marilyn Ford will  receive a summary of her genetic counseling visit and a copy of her results once available. This information will also be available in Epic.   Marilyn Ford questions were answered to her satisfaction today. Our contact information was provided should additional questions or concerns arise. Thank you for the referral and allowing [place] to share in the care of your patient.   Faith Rogue, MS, [facility] Genetic Counselor [email or web address] Phone: [phone]  The patient was seen for a total of 25 minutes in face-to-face genetic counseling.  Patient's daughter, Marilyn Ford, was also present. Dr. Grayland Ormond was available for discussion regarding this case.   _______________________________________________________________________ For Office Staff:  Number of people involved in session: 2 Was an Intern/ student involved with case: no

## 2022-05-22 ENCOUNTER — Inpatient Hospital Stay: Payer: No Typology Code available for payment source

## 2022-05-22 DIAGNOSIS — Z8049 Family history of malignant neoplasm of other genital organs: Secondary | ICD-10-CM | POA: Diagnosis not present

## 2022-05-22 DIAGNOSIS — Z5112 Encounter for antineoplastic immunotherapy: Secondary | ICD-10-CM | POA: Diagnosis not present

## 2022-05-22 DIAGNOSIS — Z171 Estrogen receptor negative status [ER-]: Secondary | ICD-10-CM

## 2022-05-22 DIAGNOSIS — Z803 Family history of malignant neoplasm of breast: Secondary | ICD-10-CM | POA: Diagnosis not present

## 2022-05-22 DIAGNOSIS — C50811 Malignant neoplasm of overlapping sites of right female breast: Secondary | ICD-10-CM | POA: Diagnosis not present

## 2022-05-22 LAB — CBC WITH DIFFERENTIAL/PLATELET
Abs Immature Granulocytes: 0.02 10*3/uL (ref 0.00–0.07)
Basophils Absolute: 0 10*3/uL (ref 0.0–0.1)
Basophils Relative: 2 %
Eosinophils Absolute: 0 10*3/uL (ref 0.0–0.5)
Eosinophils Relative: 1 %
HCT: 23 % — ABNORMAL LOW (ref 36.0–46.0)
Hemoglobin: 7.8 g/dL — ABNORMAL LOW (ref 12.0–15.0)
Immature Granulocytes: 1 %
Lymphocytes Relative: 33 %
Lymphs Abs: 0.7 10*3/uL (ref 0.7–4.0)
MCH: 31.6 pg (ref 26.0–34.0)
MCHC: 33.9 g/dL (ref 30.0–36.0)
MCV: 93.1 fL (ref 80.0–100.0)
Monocytes Absolute: 0.1 10*3/uL (ref 0.1–1.0)
Monocytes Relative: 5 %
Neutro Abs: 1.2 10*3/uL — ABNORMAL LOW (ref 1.7–7.7)
Neutrophils Relative %: 58 %
Platelets: 144 10*3/uL — ABNORMAL LOW (ref 150–400)
RBC: 2.47 MIL/uL — ABNORMAL LOW (ref 3.87–5.11)
RDW: 19.1 % — ABNORMAL HIGH (ref 11.5–15.5)
WBC: 2.1 10*3/uL — ABNORMAL LOW (ref 4.0–10.5)
nRBC: 0 % (ref 0.0–0.2)

## 2022-05-22 MED ORDER — HEPARIN SOD (PORK) LOCK FLUSH 100 UNIT/ML IV SOLN
500.0000 [IU] | Freq: Once | INTRAVENOUS | Status: AC
  Administered 2022-05-22: 500 [IU] via INTRAVENOUS

## 2022-05-22 MED ORDER — SODIUM CHLORIDE 0.9% FLUSH
10.0000 mL | Freq: Once | INTRAVENOUS | Status: AC
  Administered 2022-05-22: 10 mL via INTRAVENOUS

## 2022-05-22 NOTE — Progress Notes (Signed)
Patient presents today for possible GCSF per providers order.  Vital signs WNL.  Patient has no new complaints at this time.  ANC is 1.2, MD notified.  Per Dr. Delton Coombes patient does not need GCSF if ANC greater than 1.  Discharge from clinic via wheelchair in stable condition.  Alert and oriented X 3.  Follow up with [facility] as scheduled.

## 2022-05-22 NOTE — Progress Notes (Signed)
Patients port flushed without difficulty.  Good blood return noted with no bruising or swelling noted at site.  Patient remaining accessed for possible treatment 05/23/22.

## 2022-05-23 ENCOUNTER — Inpatient Hospital Stay: Payer: No Typology Code available for payment source

## 2022-05-23 VITALS — BP 99/67 | HR 90 | Temp 97.6°F | Resp 16

## 2022-05-23 DIAGNOSIS — C50811 Malignant neoplasm of overlapping sites of right female breast: Secondary | ICD-10-CM

## 2022-05-23 DIAGNOSIS — Z5112 Encounter for antineoplastic immunotherapy: Secondary | ICD-10-CM | POA: Diagnosis not present

## 2022-05-23 MED ORDER — CETIRIZINE HCL 10 MG/ML IV SOLN
10.0000 mg | Freq: Once | INTRAVENOUS | Status: AC
  Administered 2022-05-23: 10 mg via INTRAVENOUS
  Filled 2022-05-23: qty 1

## 2022-05-23 MED ORDER — HEPARIN SOD (PORK) LOCK FLUSH 100 UNIT/ML IV SOLN
500.0000 [IU] | Freq: Once | INTRAVENOUS | Status: AC | PRN
  Administered 2022-05-23: 500 [IU]

## 2022-05-23 MED ORDER — SODIUM CHLORIDE 0.9% FLUSH
10.0000 mL | INTRAVENOUS | Status: DC | PRN
  Administered 2022-05-23: 10 mL

## 2022-05-23 MED ORDER — SODIUM CHLORIDE 0.9 % IV SOLN: Freq: Once | INTRAVENOUS | Status: AC

## 2022-05-23 MED ORDER — SODIUM CHLORIDE 0.9 % IV SOLN
80.0000 mg/m2 | Freq: Once | INTRAVENOUS | Status: AC
  Administered 2022-05-23: 150 mg via INTRAVENOUS
  Filled 2022-05-23: qty 25

## 2022-05-23 MED ORDER — FAMOTIDINE IN NACL 20-0.9 MG/50ML-% IV SOLN
20.0000 mg | Freq: Once | INTRAVENOUS | Status: AC
  Administered 2022-05-23: 20 mg via INTRAVENOUS
  Filled 2022-05-23: qty 50

## 2022-05-23 NOTE — Patient Instructions (Signed)
[facility]  Discharge Instructions: Thank you for choosing [facility] to provide your oncology and hematology care.  If you have a lab appointment with the [facility], please come in thru the Main Entrance and check in at the main information desk.  Wear comfortable clothing and clothing appropriate for easy access to any Portacath or PICC line.   We strive to give you quality time with your provider. You may need to reschedule your appointment if you arrive late (15 or more minutes).  Arriving late affects you and other patients whose appointments are after yours.  Also, if you miss three or more appointments without notifying the office, you may be dismissed from the clinic at the provider's discretion.      For prescription refill requests, have your pharmacy contact our office and allow 72 hours for refills to be completed.    Today you received the following chemotherapy and/or immunotherapy agents Taxol      To help prevent nausea and vomiting after your treatment, we encourage you to take your nausea medication as directed.  BELOW ARE SYMPTOMS THAT SHOULD BE REPORTED IMMEDIATELY: *FEVER GREATER THAN 100.4 F (38 C) OR HIGHER *CHILLS OR SWEATING *NAUSEA AND VOMITING THAT IS NOT CONTROLLED WITH YOUR NAUSEA MEDICATION *UNUSUAL SHORTNESS OF BREATH *UNUSUAL BRUISING OR BLEEDING *URINARY PROBLEMS (pain or burning when urinating, or frequent urination) *BOWEL PROBLEMS (unusual diarrhea, constipation, pain near the anus) TENDERNESS IN MOUTH AND THROAT WITH OR WITHOUT PRESENCE OF ULCERS (sore throat, sores in mouth, or a toothache) UNUSUAL RASH, SWELLING OR PAIN  UNUSUAL VAGINAL DISCHARGE OR ITCHING   Items with * indicate a potential emergency and should be followed up as soon as possible or go to the Emergency Department if any problems should occur.  Please show the CHEMOTHERAPY ALERT CARD or IMMUNOTHERAPY ALERT CARD at check-in to the Emergency  Department and triage nurse.  Should you have questions after your visit or need to cancel or reschedule your appointment, please contact [facility] [phone]  and follow the prompts.  Office hours are 8:00 a.m. to 4:30 p.m. Monday - Friday. Please note that voicemails left after 4:00 p.m. may not be returned until the following business day.  We are closed weekends and major holidays. You have access to a nurse at all times for urgent questions. Please call the main number to the clinic [phone] and follow the prompts.  For any non-urgent questions, you may also contact your provider using MyChart. We now offer e-Visits for anyone 66 and older to request care online for non-urgent symptoms. For details visit [email or web address].   Also download the MyChart app! Go to the app store, search "MyChart", open the app, select [place], and log in with your MyChart username and password.

## 2022-05-23 NOTE — Progress Notes (Signed)
Patient presents today for Taxol infusion per providers order.  Vital signs within parameters for treatment.  ANC 1.2 and Hgb 7.8 on 05/22/22, MD aware.  Message received from Adonis Huguenin RN/Dr. Delton Coombes,  okay to treat patient.  Treatment given today per MD orders.  Stable during infusion without adverse affects.  Vital signs stable.  No complaints at this time.  Discharge from clinic ambulatory in stable condition.  Alert and oriented X 3.  Follow up with [facility] as scheduled.

## 2022-05-24 ENCOUNTER — Telehealth: Payer: Self-pay | Admitting: Hematology

## 2022-05-24 NOTE — Telephone Encounter (Signed)
Decrease the amlodipine to 5 mg a day. (Break it in half)

## 2022-05-24 NOTE — Telephone Encounter (Signed)
Patient notified

## 2022-05-28 ENCOUNTER — Telehealth: Payer: Self-pay | Admitting: Family Medicine

## 2022-05-28 ENCOUNTER — Inpatient Hospital Stay: Payer: No Typology Code available for payment source

## 2022-05-28 DIAGNOSIS — D702 Other drug-induced agranulocytosis: Secondary | ICD-10-CM | POA: Insufficient documentation

## 2022-05-28 DIAGNOSIS — E118 Type 2 diabetes mellitus with unspecified complications: Secondary | ICD-10-CM

## 2022-05-28 MED ORDER — TRULICITY 4.5 MG/0.5ML ~~LOC~~ SOAJ: 4.5000 mg | SUBCUTANEOUS | 0 refills | Status: DC

## 2022-05-28 NOTE — Telephone Encounter (Signed)
LMOVM refill sent to pharmacy 

## 2022-05-28 NOTE — Telephone Encounter (Signed)
  Prescription Request  05/28/2022  Is this a "Controlled Substance" medicine?   Have you seen your PCP in the last 2 weeks? Appt 2/5  If YES, route message to pool  -  If NO, patient needs to be scheduled for appointment.  What is the name of the medication or equipment?  Dulaglutide (TRULICITY) 4.5 RV/4.4QP SOPN   Have you contacted your pharmacy to request a refill? yes   Which pharmacy would you like this sent to?  [facility], [place] [address]     Patient notified that their request is being sent to the clinical staff for review and that they should receive a response within 2 business days.

## 2022-05-29 ENCOUNTER — Inpatient Hospital Stay: Payer: No Typology Code available for payment source

## 2022-05-29 VITALS — BP 126/73 | HR 88 | Temp 96.8°F | Resp 18 | Wt 154.9 lb

## 2022-05-29 VITALS — BP 113/68 | HR 79 | Temp 98.2°F | Resp 18

## 2022-05-29 DIAGNOSIS — C50811 Malignant neoplasm of overlapping sites of right female breast: Secondary | ICD-10-CM

## 2022-05-29 DIAGNOSIS — Z5112 Encounter for antineoplastic immunotherapy: Secondary | ICD-10-CM | POA: Diagnosis not present

## 2022-05-29 DIAGNOSIS — Z95828 Presence of other vascular implants and grafts: Secondary | ICD-10-CM

## 2022-05-29 LAB — CBC WITH DIFFERENTIAL/PLATELET
Abs Immature Granulocytes: 0.1 10*3/uL — ABNORMAL HIGH (ref 0.00–0.07)
Band Neutrophils: 2 %
Basophils Absolute: 0 10*3/uL (ref 0.0–0.1)
Basophils Relative: 1 %
Eosinophils Absolute: 0 10*3/uL (ref 0.0–0.5)
Eosinophils Relative: 2 %
HCT: 20.3 % — ABNORMAL LOW (ref 36.0–46.0)
Hemoglobin: 6.9 g/dL — CL (ref 12.0–15.0)
Lymphocytes Relative: 33 %
Lymphs Abs: 0.7 10*3/uL (ref 0.7–4.0)
MCH: 32.2 pg (ref 26.0–34.0)
MCHC: 34 g/dL (ref 30.0–36.0)
MCV: 94.9 fL (ref 80.0–100.0)
Metamyelocytes Relative: 3 %
Monocytes Absolute: 0.3 10*3/uL (ref 0.1–1.0)
Monocytes Relative: 14 %
Neutro Abs: 1 10*3/uL — ABNORMAL LOW (ref 1.7–7.7)
Neutrophils Relative %: 45 %
Platelets: 146 10*3/uL — ABNORMAL LOW (ref 150–400)
RBC: 2.14 MIL/uL — ABNORMAL LOW (ref 3.87–5.11)
RDW: 20.7 % — ABNORMAL HIGH (ref 11.5–15.5)
WBC: 2.1 10*3/uL — ABNORMAL LOW (ref 4.0–10.5)
nRBC: 0 % (ref 0.0–0.2)

## 2022-05-29 LAB — PREPARE RBC (CROSSMATCH)

## 2022-05-29 LAB — ABO/RH: ABO/RH(D): A NEG

## 2022-05-29 MED ORDER — ACETAMINOPHEN 325 MG PO TABS
650.0000 mg | ORAL_TABLET | Freq: Once | ORAL | Status: AC
  Administered 2022-05-29: 650 mg via ORAL
  Filled 2022-05-29: qty 2

## 2022-05-29 MED ORDER — SODIUM CHLORIDE 0.9% FLUSH
10.0000 mL | INTRAVENOUS | Status: AC | PRN
  Administered 2022-05-29: 10 mL

## 2022-05-29 MED ORDER — HEPARIN SOD (PORK) LOCK FLUSH 100 UNIT/ML IV SOLN
500.0000 [IU] | Freq: Once | INTRAVENOUS | Status: AC
  Administered 2022-05-29: 500 [IU] via INTRAVENOUS

## 2022-05-29 MED ORDER — HEPARIN SOD (PORK) LOCK FLUSH 100 UNIT/ML IV SOLN
500.0000 [IU] | Freq: Every day | INTRAVENOUS | Status: AC | PRN
  Administered 2022-05-29: 500 [IU]

## 2022-05-29 MED ORDER — SODIUM CHLORIDE 0.9% IV SOLUTION
250.0000 mL | Freq: Once | INTRAVENOUS | Status: AC
  Administered 2022-05-29: 250 mL via INTRAVENOUS

## 2022-05-29 MED ORDER — SODIUM CHLORIDE 0.9% FLUSH
10.0000 mL | INTRAVENOUS | Status: DC | PRN
  Administered 2022-05-29: 10 mL via INTRAVENOUS

## 2022-05-29 MED ORDER — DIPHENHYDRAMINE HCL 25 MG PO CAPS
25.0000 mg | ORAL_CAPSULE | Freq: Once | ORAL | Status: AC
  Administered 2022-05-29: 25 mg via ORAL
  Filled 2022-05-29: qty 1

## 2022-05-29 NOTE — Progress Notes (Signed)
Patients port flushed without difficulty.  Good blood return noted with no bruising or swelling noted at site.  Patient remains accessed for chemotherapy treatment tomorrow.

## 2022-05-29 NOTE — Progress Notes (Signed)
Per Delton Coombes patient to receive 2 UPRBC today for Hgb of 6.9.

## 2022-05-29 NOTE — Progress Notes (Signed)
ANC today is 1.0.  Dr. Delton Coombes notified.  No need for Releuko injection per Dr. Delton Coombes.

## 2022-05-29 NOTE — Patient Instructions (Signed)
[facility]  Discharge Instructions: Thank you for choosing [facility] to provide your oncology and hematology care.  If you have a lab appointment with the [facility], please come in thru the Main Entrance and check in at the main information desk.  Wear comfortable clothing and clothing appropriate for easy access to any Portacath or PICC line.   We strive to give you quality time with your provider. You may need to reschedule your appointment if you arrive late (15 or more minutes).  Arriving late affects you and other patients whose appointments are after yours.  Also, if you miss three or more appointments without notifying the office, you may be dismissed from the clinic at the provider's discretion.      For prescription refill requests, have your pharmacy contact our office and allow 72 hours for refills to be completed.    Today you received the following chemotherapy and/or immunotherapy agents 1 unit of blood.       To help prevent nausea and vomiting after your treatment, we encourage you to take your nausea medication as directed.  BELOW ARE SYMPTOMS THAT SHOULD BE REPORTED IMMEDIATELY: *FEVER GREATER THAN 100.4 F (38 C) OR HIGHER *CHILLS OR SWEATING *NAUSEA AND VOMITING THAT IS NOT CONTROLLED WITH YOUR NAUSEA MEDICATION *UNUSUAL SHORTNESS OF BREATH *UNUSUAL BRUISING OR BLEEDING *URINARY PROBLEMS (pain or burning when urinating, or frequent urination) *BOWEL PROBLEMS (unusual diarrhea, constipation, pain near the anus) TENDERNESS IN MOUTH AND THROAT WITH OR WITHOUT PRESENCE OF ULCERS (sore throat, sores in mouth, or a toothache) UNUSUAL RASH, SWELLING OR PAIN  UNUSUAL VAGINAL DISCHARGE OR ITCHING   Items with * indicate a potential emergency and should be followed up as soon as possible or go to the Emergency Department if any problems should occur.  Please show the CHEMOTHERAPY ALERT CARD or IMMUNOTHERAPY ALERT CARD at check-in to  the Emergency Department and triage nurse.  Should you have questions after your visit or need to cancel or reschedule your appointment, please contact [facility] [phone]  and follow the prompts.  Office hours are 8:00 a.m. to 4:30 p.m. Monday - Friday. Please note that voicemails left after 4:00 p.m. may not be returned until the following business day.  We are closed weekends and major holidays. You have access to a nurse at all times for urgent questions. Please call the main number to the clinic [phone] and follow the prompts.  For any non-urgent questions, you may also contact your provider using MyChart. We now offer e-Visits for anyone 56 and older to request care online for non-urgent symptoms. For details visit [email or web address].   Also download the MyChart app! Go to the app store, search "MyChart", open the app, select [place], and log in with your MyChart username and password.

## 2022-05-29 NOTE — Progress Notes (Signed)
Hemoglobin 6.9 today, will give 2 units of blood per Dr. Delton Coombes.

## 2022-05-29 NOTE — Progress Notes (Signed)
CRITICAL VALUE ALERT Critical value received:  HGB 6.9. Date of notification:  05-29-2022 Time of notification: 7209 am  Critical value read back:  Yes.   Nurse who received alert:  B. Amere Bricco RN.  MD notified time and response:  Dr. Delton Coombes by C. Page Therapist, sports. Patient to receive 2 units of blood today.   Patient presents today for 2 units of PRBC's/ Vitals signs stable. Patient has no complaints of any changes since her last visit. MAR reviewed and updated.  Patient has complaints of fatigue not relieved by rest.   12 units of blood given today per MD orders. Tolerated infusion without adverse affects. Vital signs stable. No complaints at this time. Discharged from clinic by wheel chair in stable condition. Alert and oriented x 3. F/U with [facility] as scheduled.

## 2022-05-30 ENCOUNTER — Inpatient Hospital Stay: Payer: No Typology Code available for payment source

## 2022-05-30 VITALS — BP 138/86 | HR 85 | Temp 97.4°F | Resp 18

## 2022-05-30 DIAGNOSIS — Z5112 Encounter for antineoplastic immunotherapy: Secondary | ICD-10-CM | POA: Diagnosis not present

## 2022-05-30 DIAGNOSIS — Z171 Estrogen receptor negative status [ER-]: Secondary | ICD-10-CM

## 2022-05-30 LAB — TYPE AND SCREEN
ABO/RH(D): A NEG
Antibody Screen: NEGATIVE
Unit division: 0
Unit division: 0

## 2022-05-30 LAB — COMPREHENSIVE METABOLIC PANEL
ALT: 31 U/L (ref 0–44)
AST: 29 U/L (ref 15–41)
Albumin: 3.5 g/dL (ref 3.5–5.0)
Alkaline Phosphatase: 109 U/L (ref 38–126)
Anion gap: 9 (ref 5–15)
BUN: 18 mg/dL (ref 8–23)
CO2: 23 mmol/L (ref 22–32)
Calcium: 8.9 mg/dL (ref 8.9–10.3)
Chloride: 103 mmol/L (ref 98–111)
Creatinine, Ser: 0.96 mg/dL (ref 0.44–1.00)
GFR, Estimated: >60 mL/min (ref >60)
Glucose, Bld: 140 mg/dL — ABNORMAL HIGH (ref 70–99)
Potassium: 4.2 mmol/L (ref 3.5–5.1)
Sodium: 135 mmol/L (ref 135–145)
Total Bilirubin: 0.5 mg/dL (ref 0.3–1.2)
Total Protein: 7.1 g/dL (ref 6.5–8.1)

## 2022-05-30 LAB — BPAM RBC
Blood Product Expiration Date: 202401262359
Blood Product Expiration Date: 202402172359
ISSUE DATE / TIME: 202401231257
ISSUE DATE / TIME: 202401231447
Unit Type and Rh: 600
Unit Type and Rh: 600

## 2022-05-30 LAB — MAGNESIUM: Magnesium: 2 mg/dL (ref 1.7–2.4)

## 2022-05-30 MED ORDER — SODIUM CHLORIDE 0.9% FLUSH
10.0000 mL | INTRAVENOUS | Status: DC | PRN
  Administered 2022-05-30: 10 mL

## 2022-05-30 MED ORDER — SODIUM CHLORIDE 0.9 % IV SOLN: Freq: Once | INTRAVENOUS | Status: AC

## 2022-05-30 MED ORDER — SODIUM CHLORIDE 0.9 % IV SOLN
80.0000 mg/m2 | Freq: Once | INTRAVENOUS | Status: AC
  Administered 2022-05-30: 150 mg via INTRAVENOUS
  Filled 2022-05-30: qty 25

## 2022-05-30 MED ORDER — HEPARIN SOD (PORK) LOCK FLUSH 100 UNIT/ML IV SOLN
500.0000 [IU] | Freq: Once | INTRAVENOUS | Status: AC | PRN
  Administered 2022-05-30: 500 [IU]

## 2022-05-30 MED ORDER — CETIRIZINE HCL 10 MG/ML IV SOLN
10.0000 mg | Freq: Once | INTRAVENOUS | Status: AC
  Administered 2022-05-30: 10 mg via INTRAVENOUS
  Filled 2022-05-30: qty 1

## 2022-05-30 MED ORDER — FAMOTIDINE IN NACL 20-0.9 MG/50ML-% IV SOLN
20.0000 mg | Freq: Once | INTRAVENOUS | Status: AC
  Administered 2022-05-30: 20 mg via INTRAVENOUS
  Filled 2022-05-30: qty 50

## 2022-05-30 NOTE — Progress Notes (Signed)
Patient presents today for chemotherapy infusion.  Patient is in satisfactory condition with no complaints today.  Vital signs are stable.  Labs reviewed.  Hemoglobin on 05/29/22 was 6.9.  Patient received two units of PRBC yesterday.  ANC was 1.0. All other labs are within treatment parameters.  We will proceed with treatment per MD orders.   Patient tolerated treatment well with no complaints voiced.  Patient left ambulatory in stable condition.  Vital signs stable at discharge.  Follow up as scheduled.

## 2022-05-30 NOTE — Patient Instructions (Signed)
[facility]  Discharge Instructions: Thank you for choosing [facility] to provide your oncology and hematology care.  If you have a lab appointment with the Cancer Center, please come in thru the Main Entrance and check in at the main information desk.  Wear comfortable clothing and clothing appropriate for easy access to any Portacath or PICC line.   We strive to give you quality time with your provider. You may need to reschedule your appointment if you arrive late (15 or more minutes).  Arriving late affects you and other patients whose appointments are after yours.  Also, if you miss three or more appointments without notifying the office, you may be dismissed from the clinic at the provider's discretion.      For prescription refill requests, have your pharmacy contact our office and allow 72 hours for refills to be completed.    Today you received the following chemotherapy and/or immunotherapy agents Taxol.  Paclitaxel Injection What is this medication? PACLITAXEL (PAK li TAX el) treats some types of cancer. It works by slowing down the growth of cancer cells. This medicine may be used for other purposes; ask your health care provider or pharmacist if you have questions. COMMON BRAND NAME(S): Onxol, Taxol What should I tell my care team before I take this medication? They need to know if you have any of these conditions: Heart disease Liver disease Low white blood cell levels An unusual or allergic reaction to paclitaxel, other medications, foods, dyes, or preservatives If you or your partner are pregnant or trying to get pregnant Breast-feeding How should I use this medication? This medication is injected into a vein. It is given by your care team in a hospital or clinic setting. Talk to your care team about the use of this medication in children. While it may be given to children for selected conditions, precautions do apply. Overdosage: If  you think you have taken too much of this medicine contact a poison control center or emergency room at once. NOTE: This medicine is only for you. Do not share this medicine with others. What if I miss a dose? Keep appointments for follow-up doses. It is important not to miss your dose. Call your care team if you are unable to keep an appointment. What may interact with this medication? Do not take this medication with any of the following: Live virus vaccines Other medications may affect the way this medication works. Talk with your care team about all of the medications you take. They may suggest changes to your treatment plan to lower the risk of side effects and to make sure your medications work as intended. This list may not describe all possible interactions. Give your health care provider a list of all the medicines, herbs, non-prescription drugs, or dietary supplements you use. Also tell them if you smoke, drink alcohol, or use illegal drugs. Some items may interact with your medicine. What should I watch for while using this medication? Your condition will be monitored carefully while you are receiving this medication. You may need blood work while taking this medication. This medication may make you feel generally unwell. This is not uncommon as chemotherapy can affect healthy cells as well as cancer cells. Report any side effects. Continue your course of treatment even though you feel ill unless your care team tells you to stop. This medication can cause serious allergic reactions. To reduce the risk, your care team may give you other medications to   take before receiving this one. Be sure to follow the directions from your care team. This medication may increase your risk of getting an infection. Call your care team for advice if you get a fever, chills, sore throat, or other symptoms of a cold or flu. Do not treat yourself. Try to avoid being around people who are sick. This medication may  increase your risk to bruise or bleed. Call your care team if you notice any unusual bleeding. Be careful brushing or flossing your teeth or using a toothpick because you may get an infection or bleed more easily. If you have any dental work done, tell your dentist you are receiving this medication. Talk to your care team if you may be pregnant. Serious birth defects can occur if you take this medication during pregnancy. Talk to your care team before breastfeeding. Changes to your treatment plan may be needed. What side effects may I notice from receiving this medication? Side effects that you should report to your care team as soon as possible: Allergic reactions--skin rash, itching, hives, swelling of the face, lips, tongue, or throat Heart rhythm changes--fast or irregular heartbeat, dizziness, feeling faint or lightheaded, chest pain, trouble breathing Increase in blood pressure Infection--fever, chills, cough, sore throat, wounds that don't heal, pain or trouble when passing urine, general feeling of discomfort or being unwell Low blood pressure--dizziness, feeling faint or lightheaded, blurry vision Low red blood cell level--unusual weakness or fatigue, dizziness, headache, trouble breathing Painful swelling, warmth, or redness of the skin, blisters or sores at the infusion site Pain, tingling, or numbness in the hands or feet Slow heartbeat--dizziness, feeling faint or lightheaded, confusion, trouble breathing, unusual weakness or fatigue Unusual bruising or bleeding Side effects that usually do not require medical attention (report to your care team if they continue or are bothersome): Diarrhea Hair loss Joint pain Loss of appetite Muscle pain Nausea Vomiting This list may not describe all possible side effects. Call your doctor for medical advice about side effects. You may report side effects to FDA at 1-800-FDA-1088. Where should I keep my medication? This medication is given in  a hospital or clinic. It will not be stored at home. NOTE: This sheet is a summary. It may not cover all possible information. If you have questions about this medicine, talk to your doctor, pharmacist, or health care provider.  2023 Elsevier/Gold Standard (2021-08-23 00:00:00)        To help prevent nausea and vomiting after your treatment, we encourage you to take your nausea medication as directed.  BELOW ARE SYMPTOMS THAT SHOULD BE REPORTED IMMEDIATELY: *FEVER GREATER THAN 100.4 F (38 C) OR HIGHER *CHILLS OR SWEATING *NAUSEA AND VOMITING THAT IS NOT CONTROLLED WITH YOUR NAUSEA MEDICATION *UNUSUAL SHORTNESS OF BREATH *UNUSUAL BRUISING OR BLEEDING *URINARY PROBLEMS (pain or burning when urinating, or frequent urination) *BOWEL PROBLEMS (unusual diarrhea, constipation, pain near the anus) TENDERNESS IN MOUTH AND THROAT WITH OR WITHOUT PRESENCE OF ULCERS (sore throat, sores in mouth, or a toothache) UNUSUAL RASH, SWELLING OR PAIN  UNUSUAL VAGINAL DISCHARGE OR ITCHING   Items with * indicate a potential emergency and should be followed up as soon as possible or go to the Emergency Department if any problems should occur.  Please show the CHEMOTHERAPY ALERT CARD or IMMUNOTHERAPY ALERT CARD at check-in to the Emergency Department and triage nurse.  Should you have questions after your visit or need to cancel or reschedule your appointment, please contact [facility] [phone]    and follow the prompts.  Office hours are 8:00 a.m. to 4:30 p.m. Monday - Friday. Please note that voicemails left after 4:00 p.m. may not be returned until the following business day.  We are closed weekends and major holidays. You have access to a nurse at all times for urgent questions. Please call the main number to the clinic [phone] and follow the prompts.  For any non-urgent questions, you may also contact your provider using MyChart. We now offer e-Visits for anyone 18 and older to  request care online for non-urgent symptoms. For details visit [email or web address].   Also download the MyChart app! Go to the app store, search "MyChart", open the app, select [facility], and log in with your MyChart username and password.   

## 2022-05-31 ENCOUNTER — Telehealth: Payer: Self-pay | Admitting: Licensed Clinical Social Worker

## 2022-05-31 ENCOUNTER — Inpatient Hospital Stay: Payer: No Typology Code available for payment source

## 2022-05-31 VITALS — BP 129/85 | HR 80 | Temp 98.1°F | Resp 18

## 2022-05-31 DIAGNOSIS — Z171 Estrogen receptor negative status [ER-]: Secondary | ICD-10-CM

## 2022-05-31 DIAGNOSIS — D702 Other drug-induced agranulocytosis: Secondary | ICD-10-CM

## 2022-05-31 DIAGNOSIS — Z5112 Encounter for antineoplastic immunotherapy: Secondary | ICD-10-CM | POA: Diagnosis not present

## 2022-05-31 DIAGNOSIS — N6313 Unspecified lump in the right breast, lower outer quadrant: Secondary | ICD-10-CM

## 2022-05-31 MED ORDER — FILGRASTIM-AYOW 480 MCG/0.8ML ~~LOC~~ SOSY: 480.0000 ug | PREFILLED_SYRINGE | Freq: Once | SUBCUTANEOUS | Status: DC

## 2022-05-31 MED ORDER — FILGRASTIM-AYOW 480 MCG/0.8ML ~~LOC~~ SOSY
480.0000 ug | PREFILLED_SYRINGE | Freq: Once | SUBCUTANEOUS | Status: AC
  Administered 2022-05-31: 480 ug via SUBCUTANEOUS
  Filled 2022-05-31: qty 0.8

## 2022-05-31 NOTE — Patient Instructions (Signed)
[facility]  Discharge Instructions: Thank you for choosing [facility] to provide your oncology and hematology care.  If you have a lab appointment with the [facility], please come in thru the Main Entrance and check in at the main information desk.  Wear comfortable clothing and clothing appropriate for easy access to any Portacath or PICC line.   We strive to give you quality time with your provider. You may need to reschedule your appointment if you arrive late (15 or more minutes).  Arriving late affects you and other patients whose appointments are after yours.  Also, if you miss three or more appointments without notifying the office, you may be dismissed from the clinic at the provider's discretion.      For prescription refill requests, have your pharmacy contact our office and allow 72 hours for refills to be completed.    Today you received the following Releuko, return as scheduled.   To help prevent nausea and vomiting after your treatment, we encourage you to take your nausea medication as directed.  BELOW ARE SYMPTOMS THAT SHOULD BE REPORTED IMMEDIATELY: *FEVER GREATER THAN 100.4 F (38 C) OR HIGHER *CHILLS OR SWEATING *NAUSEA AND VOMITING THAT IS NOT CONTROLLED WITH YOUR NAUSEA MEDICATION *UNUSUAL SHORTNESS OF BREATH *UNUSUAL BRUISING OR BLEEDING *URINARY PROBLEMS (pain or burning when urinating, or frequent urination) *BOWEL PROBLEMS (unusual diarrhea, constipation, pain near the anus) TENDERNESS IN MOUTH AND THROAT WITH OR WITHOUT PRESENCE OF ULCERS (sore throat, sores in mouth, or a toothache) UNUSUAL RASH, SWELLING OR PAIN  UNUSUAL VAGINAL DISCHARGE OR ITCHING   Items with * indicate a potential emergency and should be followed up as soon as possible or go to the Emergency Department if any problems should occur.  Please show the CHEMOTHERAPY ALERT CARD or IMMUNOTHERAPY ALERT CARD at check-in to the Emergency Department and triage  nurse.  Should you have questions after your visit or need to cancel or reschedule your appointment, please contact [facility] [phone]  and follow the prompts.  Office hours are 8:00 a.m. to 4:30 p.m. Monday - Friday. Please note that voicemails left after 4:00 p.m. may not be returned until the following business day.  We are closed weekends and major holidays. You have access to a nurse at all times for urgent questions. Please call the main number to the clinic [phone] and follow the prompts.  For any non-urgent questions, you may also contact your provider using MyChart. We now offer e-Visits for anyone 104 and older to request care online for non-urgent symptoms. For details visit [email or web address].   Also download the MyChart app! Go to the app store, search "MyChart", open the app, select [facility], and log in with your MyChart username and password.

## 2022-05-31 NOTE — Progress Notes (Signed)
Patient tolerated injection with no complaints voiced. Site clean and dry with no bruising or swelling noted at site. See MAR for details. Band aid applied.  Patient stable during and after injection. VSS with discharge and left in satisfactory condition with no s/s of distress noted.  

## 2022-06-01 ENCOUNTER — Inpatient Hospital Stay: Payer: No Typology Code available for payment source

## 2022-06-01 ENCOUNTER — Other Ambulatory Visit: Payer: Self-pay

## 2022-06-01 VITALS — BP 127/89 | HR 96 | Temp 98.8°F | Resp 18

## 2022-06-01 DIAGNOSIS — Z171 Estrogen receptor negative status [ER-]: Secondary | ICD-10-CM

## 2022-06-01 DIAGNOSIS — Z5112 Encounter for antineoplastic immunotherapy: Secondary | ICD-10-CM | POA: Diagnosis not present

## 2022-06-01 MED ORDER — FILGRASTIM-AYOW 480 MCG/0.8ML ~~LOC~~ SOSY
480.0000 ug | PREFILLED_SYRINGE | Freq: Once | SUBCUTANEOUS | Status: AC
  Administered 2022-06-01: 480 ug via SUBCUTANEOUS
  Filled 2022-06-01: qty 0.8

## 2022-06-01 NOTE — Progress Notes (Signed)
Marilyn Ford presents today for injection per the provider's orders.  Releuko administration without incident; injection site WNL; see MAR for injection details.  Patient tolerated procedure well and without incident. Patient remained stable throughout visit. No questions or complaints noted at this time. Patient discharged ambulatory by wheelchair in stable condition with family member.

## 2022-06-01 NOTE — Patient Instructions (Signed)
[facility]  Discharge Instructions: Thank you for choosing [facility] to provide your oncology and hematology care.  If you have a lab appointment with the [name], please come in thru the Main Entrance and check in at the main information desk.  Wear comfortable clothing and clothing appropriate for easy access to any Portacath or PICC line.   We strive to give you quality time with your provider. You may need to reschedule your appointment if you arrive late (15 or more minutes).  Arriving late affects you and other patients whose appointments are after yours.  Also, if you miss three or more appointments without notifying the office, you may be dismissed from the clinic at the provider's discretion.      For prescription refill requests, have your pharmacy contact our office and allow 72 hours for refills to be completed.    Today you received the following chemotherapy and/or immunotherapy agents Releuko.      To help prevent nausea and vomiting after your treatment, we encourage you to take your nausea medication as directed.  BELOW ARE SYMPTOMS THAT SHOULD BE REPORTED IMMEDIATELY: *FEVER GREATER THAN 100.4 F (38 C) OR HIGHER *CHILLS OR SWEATING *NAUSEA AND VOMITING THAT IS NOT CONTROLLED WITH YOUR NAUSEA MEDICATION *UNUSUAL SHORTNESS OF BREATH *UNUSUAL BRUISING OR BLEEDING *URINARY PROBLEMS (pain or burning when urinating, or frequent urination) *BOWEL PROBLEMS (unusual diarrhea, constipation, pain near the anus) TENDERNESS IN MOUTH AND THROAT WITH OR WITHOUT PRESENCE OF ULCERS (sore throat, sores in mouth, or a toothache) UNUSUAL RASH, SWELLING OR PAIN  UNUSUAL VAGINAL DISCHARGE OR ITCHING   Items with * indicate a potential emergency and should be followed up as soon as possible or go to the Emergency Department if any problems should occur.  Please show the CHEMOTHERAPY ALERT CARD or IMMUNOTHERAPY ALERT CARD at check-in to the  Emergency Department and triage nurse.  Should you have questions after your visit or need to cancel or reschedule your appointment, please contact [name] [phone]  and follow the prompts.  Office hours are 8:00 a.m. to 4:30 p.m. Monday - Friday. Please note that voicemails left after 4:00 p.m. may not be returned until the following business day.  We are closed weekends and major holidays. You have access to a nurse at all times for urgent questions. Please call the main number to the clinic [phone] and follow the prompts.  For any non-urgent questions, you may also contact your provider using MyChart. We now offer e-Visits for anyone 50 and older to request care online for non-urgent symptoms. For details visit [email or web address].   Also download the MyChart app! Go to the app store, search "MyChart", open the app, select [name], and log in with your MyChart username and password.

## 2022-06-03 ENCOUNTER — Other Ambulatory Visit: Payer: Self-pay | Admitting: Hematology

## 2022-06-04 ENCOUNTER — Encounter: Payer: Self-pay | Admitting: Hematology

## 2022-06-04 ENCOUNTER — Inpatient Hospital Stay (HOSPITAL_BASED_OUTPATIENT_CLINIC_OR_DEPARTMENT_OTHER): Payer: No Typology Code available for payment source | Admitting: Hematology

## 2022-06-04 ENCOUNTER — Inpatient Hospital Stay: Payer: No Typology Code available for payment source

## 2022-06-04 VITALS — BP 134/82 | HR 93 | Temp 97.0°F | Resp 18 | Wt 151.6 lb

## 2022-06-04 DIAGNOSIS — Z171 Estrogen receptor negative status [ER-]: Secondary | ICD-10-CM

## 2022-06-04 DIAGNOSIS — D702 Other drug-induced agranulocytosis: Secondary | ICD-10-CM

## 2022-06-04 DIAGNOSIS — C50811 Malignant neoplasm of overlapping sites of right female breast: Secondary | ICD-10-CM

## 2022-06-04 DIAGNOSIS — Z5112 Encounter for antineoplastic immunotherapy: Secondary | ICD-10-CM | POA: Diagnosis not present

## 2022-06-04 DIAGNOSIS — N6313 Unspecified lump in the right breast, lower outer quadrant: Secondary | ICD-10-CM

## 2022-06-04 LAB — CBC WITH DIFFERENTIAL/PLATELET
Abs Immature Granulocytes: 0.05 10*3/uL (ref 0.00–0.07)
Basophils Absolute: 0 10*3/uL (ref 0.0–0.1)
Basophils Relative: 1 %
Eosinophils Absolute: 0 10*3/uL (ref 0.0–0.5)
Eosinophils Relative: 0 %
HCT: 31.7 % — ABNORMAL LOW (ref 36.0–46.0)
Hemoglobin: 10.4 g/dL — ABNORMAL LOW (ref 12.0–15.0)
Immature Granulocytes: 1 %
Lymphocytes Relative: 19 %
Lymphs Abs: 1.2 10*3/uL (ref 0.7–4.0)
MCH: 30.2 pg (ref 26.0–34.0)
MCHC: 32.8 g/dL (ref 30.0–36.0)
MCV: 92.2 fL (ref 80.0–100.0)
Monocytes Absolute: 0.4 10*3/uL (ref 0.1–1.0)
Monocytes Relative: 6 %
Neutro Abs: 4.7 10*3/uL (ref 1.7–7.7)
Neutrophils Relative %: 73 %
Platelets: 133 10*3/uL — ABNORMAL LOW (ref 150–400)
RBC: 3.44 MIL/uL — ABNORMAL LOW (ref 3.87–5.11)
RDW: 20.9 % — ABNORMAL HIGH (ref 11.5–15.5)
WBC: 6.4 10*3/uL (ref 4.0–10.5)
nRBC: 0 % (ref 0.0–0.2)

## 2022-06-04 LAB — COMPREHENSIVE METABOLIC PANEL
ALT: 66 U/L — ABNORMAL HIGH (ref 0–44)
AST: 35 U/L (ref 15–41)
Albumin: 3.8 g/dL (ref 3.5–5.0)
Alkaline Phosphatase: 141 U/L — ABNORMAL HIGH (ref 38–126)
Anion gap: 10 (ref 5–15)
BUN: 19 mg/dL (ref 8–23)
CO2: 25 mmol/L (ref 22–32)
Calcium: 9.6 mg/dL (ref 8.9–10.3)
Chloride: 103 mmol/L (ref 98–111)
Creatinine, Ser: 0.83 mg/dL (ref 0.44–1.00)
GFR, Estimated: >60 mL/min (ref >60)
Glucose, Bld: 136 mg/dL — ABNORMAL HIGH (ref 70–99)
Potassium: 4.5 mmol/L (ref 3.5–5.1)
Sodium: 138 mmol/L (ref 135–145)
Total Bilirubin: 0.6 mg/dL (ref 0.3–1.2)
Total Protein: 7.2 g/dL (ref 6.5–8.1)

## 2022-06-04 LAB — SAMPLE TO BLOOD BANK

## 2022-06-04 LAB — MAGNESIUM: Magnesium: 1.7 mg/dL (ref 1.7–2.4)

## 2022-06-04 MED ORDER — SODIUM CHLORIDE 0.9% FLUSH
10.0000 mL | INTRAVENOUS | Status: DC | PRN
  Administered 2022-06-04: 10 mL via INTRAVENOUS

## 2022-06-04 MED ORDER — SODIUM CHLORIDE 0.9% FLUSH
10.0000 mL | Freq: Once | INTRAVENOUS | Status: AC
  Administered 2022-06-04: 10 mL via INTRAVENOUS

## 2022-06-04 MED ORDER — HEPARIN SOD (PORK) LOCK FLUSH 100 UNIT/ML IV SOLN
500.0000 [IU] | Freq: Once | INTRAVENOUS | Status: AC
  Administered 2022-06-04: 500 [IU] via INTRAVENOUS

## 2022-06-04 NOTE — Progress Notes (Signed)
[place], [place] [address]   CLINIC:  Medical Oncology/Hematology  PCP:  Claretta Fraise, MD [id number] [id number] [phone]   REASON FOR VISIT:  Follow-up for locally advanced TNBC  PRIOR THERAPY: None  NGS Results: Not done  CURRENT THERAPY: Pembrolizumab, carboplatin and paclitaxel followed by pembrolizumab, Adriamycin and Cytoxan  BRIEF ONCOLOGIC HISTORY:  Oncology History Overview Note   Cancer Staging  Malignant neoplasm of overlapping sites of right female breast [facility]) Staging form: Breast, AJCC 8th Edition - Clinical stage from 03/08/2022: Stage IIIC (cT2, cN2, cM0, G3, ER-, PR-, HER2-) - Signed by Truitt Merle, MD on 03/08/2022    Malignant neoplasm of overlapping sites of right female breast [facility])  02/20/2022 Mammogram   CLINICAL DATA:  62 year old female recalled from screening mammography 03/01/2021 for right breast calcifications and subsequent benign discordant biopsy of these calcifications in the central posterior right breast December 2022 with excision recommended. The patient initially followed up with surgery in April however canceled her scheduled surgery and most recently followed up with Dr. [name] September 2023 with diagnostic imaging, possible RF tag placement and subsequent excision recommended.   EXAM: DIGITAL DIAGNOSTIC BILATERAL MAMMOGRAM WITH TOMOSYNTHESIS; ULTRASOUND RIGHT BREAST LIMITED  MPRESSION: 1.  Suspicious right axillary lymphadenopathy.   2. Indeterminate intramammary lymph node in the right breast at 9 o'clock.   3. 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   4. Decreased calcifications noted at prior benign biopsy site in the lower central posterior right breast at site of X shaped biopsy marking clip.   02/20/2022  Initial Biopsy   FINAL MICROSCOPIC DIAGNOSIS:   A. AXILLA, RIGHT, LYMPH NODE, NEEDLE CORE BIOPSY:  - Positive for carcinoma (see Comment)   B. LYMPH NODE, RIGHT BREAST, BIOPSY:  - Negative for carcinoma   COMMENT:  Part A: Morphology and immunohistochemical staining are most compatible with primary breast carcinoma with metaplastic changes, however differential diagnosis also includes urothelial carcinoma and less likely primary lung carcinoma (squamous).  No lymphoid tissue is identified.  Clinical and radiologic correlation is suggested.   ADDENDUM:  In case of a breast origin, the appropriate grade would be grade 3  (3+3+2)   ADDENDUM:  PROGNOSTIC INDICATOR RESULTS:  The tumor cells are EQUIVOCAL for Her2 (2+).  Estrogen Receptor:       0%, NEGATIVE  Progesterone Receptor:   0%, NEGATIVE  Proliferation Marker Ki-67:   60%   ADDENDUM:  FLOURESCENCE IN-SITU HYBRIDIZATION RESULTS:  GROUP 5:   HER2 **NEGATIVE**    03/02/2022 Imaging   EXAM: BILATERAL BREAST MRI WITH AND WITHOUT CONTRAST  IMPRESSION: 1. There is a suspicious 3.8 cm area of patchy non mass enhancement in a linear orientation in the slightly upper outer right breast in the mid to posterior depth spanning 3.8 cm.   2. Diffuse skin thickening with enhancement of the skin, concerning for inflammatory breast cancer.   3. Numerous bulky matted lymph nodes in the right axilla, one of which corresponds with the biopsy-proven metastatic lymph node.   4.  No evidence of left breast malignancy.   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 ([name])   03/08/2022 Cancer Staging   Staging form: Breast, AJCC 8th Edition - Clinical stage from 03/08/2022: Stage IIIC (cT4d, cN2, cM0, G3, ER-, PR-, HER2-) -  Signed by Truitt Merle, MD on 03/08/2022 Histologic grading system: 3 grade system   03/19/2022 Pathology Results   Diagnosis Breast, right, needle core biopsy, upper outer  quadrant, barbell clip BENIGN BREAST WITH FIBROCYSTIC CHANGES INCLUDING STROMAL FIBROSIS, ADENOSIS AND USUAL DUCT HYPERPLASIA BENIGN FIBROMATOID CHANGE NEGATIVE FOR MICROCALCIFICATIONS NEGATIVE FOR CARCINOMA   03/22/2022 PET scan   IMPRESSION: Bulky hypermetabolic right axillary and subpectoral lymphadenopathy, consistent with metastatic disease.   No other definite sites of metastatic disease identified.   6 mm right lower lobe pulmonary nodule shows no FDG uptake, but is too small to definitively characterize by PET. Recommend continued follow-up by chest CT in 3-4 months.   Aortic Atherosclerosis (ICD10-I70.0).   04/02/2022 -  Chemotherapy   Patient is on Treatment Plan : BREAST Pembrolizumab (200) D1 + Carboplatin (5) D1 + Paclitaxel (80) D1,8,15 q21d X 4 cycles / Pembrolizumab (200) D1 + AC D1 q21d x 4 cycles       CANCER STAGING:  Cancer Staging  Malignant neoplasm of overlapping sites of right female breast [facility]) Staging form: Breast, AJCC 8th Edition - Clinical stage from 03/08/2022: Stage IIIC (cT4d, cN2, cM0, G3, ER-, PR-, HER2-) - Signed by Truitt Merle, MD on 03/08/2022    INTERVAL HISTORY:  Marilyn Ford 62 y.o. female seen for follow-up of triple negative breast cancer and toxicity assessment prior to cycle 4 of chemotherapy.  She denies any tingling or numbness in the extremities.  Energy levels are 70%.  Reports some dyspnea on exertion.  No major GI side effects.    REVIEW OF SYSTEMS:  Review of Systems  Respiratory:  Positive for shortness of breath (On exertion).   All other systems reviewed and are negative.    PAST MEDICAL/SURGICAL HISTORY:  Past Medical History:  Diagnosis Date   Allergy    Anxiety    Breast cancer ([place])    Diabetes mellitus without complication ([place])    GERD (gastroesophageal reflux disease)    Heart murmur    as a child   History of kidney stones    Hypertension    Pneumonia    Stroke [facility]    Vaginal Pap smear, abnormal     Past Surgical History:  Procedure Laterality Date   CESAREAN SECTION     CHOLECYSTECTOMY     COLONOSCOPY WITH PROPOFOL N/A 07/19/2020   Procedure: COLONOSCOPY WITH PROPOFOL;  Surgeon: Harvel Quale, MD;  Location: [facility];  Service: Gastroenterology;  Laterality: N/A;  AM   POLYPECTOMY  07/19/2020   Procedure: POLYPECTOMY;  Surgeon: Montez Morita, Quillian Quince, MD;  Location: [facility] END[facility];  Service: Gastroenterology;;   PORTACATH PLACEMENT N/A 03/20/2022   Procedure: INSERTION PORT-A-CATH;  Surgeon: Coralie Keens, MD;  Location: [facility] ORS;  Service: General;  Laterality: N/A;     SOCIAL HISTORY:  Social History   Socioeconomic History   Marital status: Married    Spouse name: Jeneen Rinks   Number of children: 2   Years of education: 10   Highest education level: 10th grade  Occupational History   Occupation: disabled  Tobacco Use   Smoking status: Former    Packs/day: 0.50    Years: 25.00    Total pack years: 12.50    Types: Cigarettes    Start date: 12/08/1999    Quit date: 03/12/2015    Years since quitting: 7.2   Smokeless tobacco: Never  Vaping Use   Vaping Use: Never used  Substance and Sexual Activity   Alcohol use: Yes  Alcohol/week: 2.0 - 3.0 standard drinks of alcohol    Types: 2 - 3 Glasses of wine per week   Drug use: No   Sexual activity: Not Currently    Birth control/protection: Abstinence, Post-menopausal  Other Topics Concern   Not on file  Social History Narrative   Volunteers at [employer] for a few hours every day.    She really enjoys getting out of of the house and working there.    Social Determinants of Health   Financial Resource Strain: Low Risk  (03/01/2022)   Overall Financial Resource Strain (CARDIA)    Difficulty of Paying Living Expenses: Not very hard  Food Insecurity: No Food Insecurity (04/09/2022)   Hunger Vital Sign    Worried About Running Out of Food in the Last Year: Never true    Ran Out of Food in  the Last Year: Never true  Transportation Needs: No Transportation Needs (03/01/2022)   PRAPARE - Hydrologist (Medical): No    Lack of Transportation (Non-Medical): No  Physical Activity: Insufficiently Active (03/01/2022)   Exercise Vital Sign    Days of Exercise per Week: 1 day    Minutes of Exercise per Session: 10 min  Stress: Stress Concern Present (03/01/2022)   [place]    Feeling of Stress : Rather much  Social Connections: Moderately Isolated (03/01/2022)   Social Connection and Isolation Panel [NHANES]    Frequency of Communication with Friends and Family: Twice a week    Frequency of Social Gatherings with Friends and Family: Twice a week    Attends Religious Services: Never    Marine scientist or Organizations: No    Attends Archivist Meetings: Never    Marital Status: Married  Human resources officer Violence: Not At Risk (04/09/2022)   Humiliation, Afraid, Rape, and Kick questionnaire    Fear of Current or Ex-Partner: No    Emotionally Abused: No    Physically Abused: No    Sexually Abused: No    FAMILY HISTORY:  Family History  Problem Relation Age of Onset   Diabetes Mother    Uterine cancer Mother 63 - 48   Diabetes Brother    Breast cancer Maternal Aunt        dx >50, d. from cancer   Cancer Maternal Grandmother        unk type, "back cancer?"    CURRENT MEDICATIONS:  Outpatient Encounter Medications as of 06/04/2022  Medication Sig   Alcohol Swabs (B-D SINGLE USE SWABS REGULAR) PADS Test BS daily and as needed Dx E11.9   amLODipine (NORVASC) 10 MG tablet 1 tablet daily   Apple Cider Vinegar 500 MG TABS Take 500 mg by mouth in the morning.   aspirin 81 MG EC tablet Take 1 tablet (81 mg total) by mouth daily.   atorvastatin (LIPITOR) 80 MG tablet TAKE 1 TABLET EVERY DAY AT 6PM   Blood Glucose Calibration (TRUE METRIX LEVEL 1) Low SOLN Use with  glucometer Dx E11.9   Blood Glucose Monitoring Suppl (TRUE METRIX AIR GLUCOSE METER) w/Device KIT Test BS daily and as needed Dx E11.9   Calcium Carb-Cholecalciferol (CALCIUM-VITAMIN D) 600-400 MG-UNIT TABS Take 1 tablet by mouth in the morning.   clopidogrel (PLAVIX) 75 MG tablet Take 75 mg by mouth daily.   Coenzyme Q10 (COQ10) 100 MG CAPS Take 100 mg by mouth in the morning.   Cranberry 425 MG CAPS  Take 425 mg by mouth in the morning and at bedtime.   Dulaglutide (TRULICITY) 4.5 RW/4.3XV SOPN Inject 4.5 mg as directed once a week.   Flaxseed, Linseed, (FLAXSEED OIL) 1000 MG CAPS Take 1,000 mg by mouth in the morning.   glucose blood (TRUE METRIX BLOOD GLUCOSE TEST) test strip Test BS daily and as needed Dx E11.9   icosapent Ethyl (VASCEPA) 1 g capsule Take 2 g by mouth 2 (two) times daily.   Krill Oil 500 MG CAPS Take 3 capsules (1,500 mg total) by mouth in the morning and at bedtime.   LORazepam (ATIVAN) 1 MG tablet Take 1 mg by mouth every 8 (eight) hours as needed.   magnesium oxide (MAG-OX) 400 (240 Mg) MG tablet TAKE 1 TABLET BY MOUTH TWICE A DAY   meloxicam (MOBIC) 15 MG tablet Take 15 mg by mouth daily.   metoprolol (TOPROL-XL) 200 MG 24 hr tablet TAKE 1 TABLET ONE TIME DAILY, WITH OR IMMEDIATELY FOLLOWING A MEAL   Multiple Vitamin (MULTIVITAMIN) capsule Take 1 capsule by mouth in the morning.   pantoprazole (PROTONIX) 40 MG tablet TAKE 1 TABLET twice daily FOR STOMACH   traMADol (ULTRAM) 50 MG tablet Take 1 tablet (50 mg total) by mouth every 6 (six) hours as needed for moderate pain or severe pain.   traZODone (DESYREL) 150 MG tablet TAKE 1 OR 2 TABLETS AT BEDTIME FOR SLEEP (Patient taking differently: Take 75 mg by mouth at bedtime.)   TRUEplus Lancets 33G MISC Test BS daily and as needed Dx E11.9   valsartan (DIOVAN) 320 MG tablet TAKE 1 TABLET (320 MG TOTAL) BY MOUTH DAILY. FOR BLOOD PRESSURE.   zinc gluconate 50 MG tablet Take 50 mg by mouth daily.   lidocaine-prilocaine (EMLA)  cream Apply to affected area once (Patient not taking: Reported on 06/04/2022)   ondansetron (ZOFRAN) 8 MG tablet Take 1 tablet (8 mg total) by mouth every 8 (eight) hours as needed for nausea or vomiting. Start on the third day after chemotherapy. (Patient not taking: Reported on 06/04/2022)   prochlorperazine (COMPAZINE) 10 MG tablet Take 1 tablet (10 mg total) by mouth every 6 (six) hours as needed for nausea or vomiting. (Patient not taking: Reported on 06/04/2022)   [EXPIRED] sodium chloride flush (NS) 0.9 % injection 10 mL    No facility-administered encounter medications on file as of 06/04/2022.    ALLERGIES:  Allergies  Allergen Reactions   Penicillins Shortness Of Breath   Sulfa Antibiotics Rash     PHYSICAL EXAM:  ECOG Performance status: 1  There were no vitals filed for this visit. There were no vitals filed for this visit. Physical Exam Vitals reviewed.  Constitutional:      Appearance: Normal appearance.  Cardiovascular:     Rate and Rhythm: Normal rate and regular rhythm.     Heart sounds: Normal heart sounds.  Pulmonary:     Effort: Pulmonary effort is normal.     Breath sounds: Normal breath sounds.  Abdominal:     Palpations: Abdomen is soft. There is no mass.  Neurological:     Mental Status: She is alert.  Psychiatric:        Mood and Affect: Mood normal.        Behavior: Behavior normal.      LABORATORY DATA:  I have reviewed the labs as listed.  CBC    Component Value Date/Time   WBC 6.4 06/04/2022 0919   RBC 3.44 (L) 06/04/2022 0919   HGB  10.4 (L) 06/04/2022 0919   HGB 13.5 04/02/2022 0924   HGB 14.1 03/07/2022 0942   HCT 31.7 (L) 06/04/2022 0919   HCT 40.3 03/07/2022 0942   PLT 133 (L) 06/04/2022 0919   PLT 228 04/02/2022 0924   PLT 336 03/07/2022 0942   MCV 92.2 06/04/2022 0919   MCV 91 03/07/2022 0942   MCH 30.2 06/04/2022 0919   MCHC 32.8 06/04/2022 0919   RDW 20.9 (H) 06/04/2022 0919   RDW 12.6 03/07/2022 0942   LYMPHSABS 1.2  06/04/2022 0919   LYMPHSABS 1.4 03/07/2022 0942   MONOABS 0.4 06/04/2022 0919   EOSABS 0.0 06/04/2022 0919   EOSABS 0.2 03/07/2022 0942   BASOSABS 0.0 06/04/2022 0919   BASOSABS 0.1 03/07/2022 0942      Latest Ref Rng & Units 06/04/2022    9:19 AM 05/30/2022    9:08 AM 05/14/2022   11:52 AM  CMP  Glucose 70 - 99 mg/dL 136  140  200   BUN 8 - 23 mg/dL '19  18  15   '$ Creatinine 0.44 - 1.00 mg/dL 0.83  0.96  1.07   Sodium 135 - 145 mmol/L 138  135  134   Potassium 3.5 - 5.1 mmol/L 4.5  4.2  4.2   Chloride 98 - 111 mmol/L 103  103  98   CO2 22 - 32 mmol/L '25  23  24   '$ Calcium 8.9 - 10.3 mg/dL 9.6  8.9  9.9   Total Protein 6.5 - 8.1 g/dL 7.2  7.1  7.4   Total Bilirubin 0.3 - 1.2 mg/dL 0.6  0.5  0.6   Alkaline Phos 38 - 126 U/L 141  109  117   AST 15 - 41 U/L 35  29  35   ALT 0 - 44 U/L 66  31  45     DIAGNOSTIC IMAGING:  I have independently reviewed the scans and discussed with the patient.  ASSESSMENT: 1.  Inflammatory right breast cancer: - Bilateral diagnostic mammogram (02/20/2022): Suspicious right axillary lymphadenopathy.  Indeterminate intramammary lymph node in the right breast at 9:00.  New right breast skin and trabecular thickening, related to vascular congestion from enlarged lymph nodes in the right axilla. - Right axillary lymph node core biopsy (02/20/2022): Morphology and IHC compatible with primary breast cancer with differential diagnosis of urothelial carcinoma and primary lung carcinoma (squamous).  Grade 3.  ER 0%, PR 0%, HER2 2+, Ki-67 60%, HER2 negative by FISH. - Right breast lymph node biopsy at 9:00 (02/20/2022): Negative for carcinoma. - MRI breast (03/02/2022): In the upper outer right breast, mid to posterior depth there is a patchy clumped non-mass enhancement in a linear orientation spanning approximately 3.8 cm.  There is diffuse thickening of the skin in the right breast with skin enhancement.  Left breast with no mass or abnormal enhancement.  Numerous  bulky matted lymph nodes in the right axilla. - Right breast UOQ biopsy (03/19/2022): Benign breast with fibrocystic changes including stromal fibrosis, adenosis, usual ductal hyperplasia.  Negative for carcinoma. - PET scan (03/22/2022): Bulky hypermetabolic right axillary and subpectoral lymphadenopathy.  No other definite new sites of metastatic disease.  6 mm right lower lobe lung nodule with no FDG uptake.       PLAN:  1.  Triple negative right breast cancer: - She has completed 3 cycles of pembrolizumab, carboplatin and weekly paclitaxel. - She did not have any neuropathy.  Previous right hand numbness from CVA has been stable.  No  major GI symptoms. - Physical examination today with no clear palpable mass.  No palpable adenopathy. - Reviewed labs today which showed ALT of 66 and alk phos 141.  Rest of LFTs are normal.  Creatinine is normal.  CBC shows white count 6.4 and platelet count 133. - She will start her cycle 4 on 06/06/2022. - I will obtain 2D echocardiogram in preparation for starting Adriamycin next cycle in 3 weeks.  2.  Hypomagnesemia: - Continue magnesium 2 tablets twice daily.  Magnesium level today is 1.7.      Orders placed this encounter:  Orders Placed This Encounter  Procedures   CBC with Differential ([place])   [place] ([place] only)   CBC with Differential   Comprehensive metabolic panel   Magnesium   CBC with Differential   Comprehensive metabolic panel   Magnesium   CBC with Differential   Comprehensive metabolic panel   Magnesium   CBC with Differential   Comprehensive metabolic panel   Magnesium   CBC with Differential ([place])   CMP ([place] only)   T4   TSH   CBC with Differential   Comprehensive metabolic panel   Magnesium   CBC with Differential ([place])   CMP ([place] only)   CBC with Differential   Comprehensive metabolic panel   Magnesium   CBC with Differential ([place])   CMP ([place] only)   CBC with Differential   Comprehensive metabolic panel   Magnesium       Derek Jack, MD [place] [phone]

## 2022-06-04 NOTE — Progress Notes (Signed)
Patients port flushed without difficulty.  Good blood return noted with no bruising or swelling noted at site.  Stable during access and blood draw.  Patient to remain accessed for treatment. 

## 2022-06-04 NOTE — Patient Instructions (Signed)
[place] at [facility] Discharge Instructions   You were seen and examined today by Dr. Delton Coombes.  He reviewed the results of your lab work which is normal/stable.   We will plan to treat you on Wednesday.   Return as scheduled.    Thank you for choosing [place] at [facility] to provide your oncology and hematology care.  To afford each patient quality time with our provider, please arrive at least 15 minutes before your scheduled appointment time.   If you have a lab appointment with the [facility] please come in thru the Main Entrance and check in at the main information desk.  You need to re-schedule your appointment should you arrive 10 or more minutes late.  We strive to give you quality time with our providers, and arriving late affects you and other patients whose appointments are after yours.  Also, if you no show three or more times for appointments you may be dismissed from the clinic at the providers discretion.     Again, thank you for choosing [facility].  Our hope is that these requests will decrease the amount of time that you wait before being seen by our physicians.       _____________________________________________________________  Should you have questions after your visit to [facility], please contact our office at [phone] and follow the prompts.  Our office hours are 8:00 a.m. and 4:30 p.m. Monday - Friday.  Please note that voicemails left after 4:00 p.m. may not be returned until the following business day.  We are closed weekends and major holidays.  You do have access to a nurse 24-7, just call the main number to the clinic [phone] and do not press any options, hold on the line and a nurse will answer the phone.    For prescription refill requests, have your pharmacy contact our office and allow 72 hours.    Due to Covid, you will need to wear a mask upon entering the  hospital. If you do not have a mask, a mask will be given to you at the Main Entrance upon arrival. For doctor visits, patients may have 1 support person age 7 or older with them. For treatment visits, patients can not have anyone with them due to social distancing guidelines and our immunocompromised population.

## 2022-06-05 ENCOUNTER — Encounter: Payer: Self-pay | Admitting: Hematology

## 2022-06-05 ENCOUNTER — Ambulatory Visit: Payer: Self-pay | Admitting: Licensed Clinical Social Worker

## 2022-06-05 ENCOUNTER — Encounter: Payer: Self-pay | Admitting: Licensed Clinical Social Worker

## 2022-06-05 DIAGNOSIS — Z1379 Encounter for other screening for genetic and chromosomal anomalies: Secondary | ICD-10-CM

## 2022-06-05 NOTE — Telephone Encounter (Signed)
I contacted Marilyn Ford to discuss her genetic testing results. No pathogenic variants were identified in the 48 genes analyzed. Detailed clinic note to follow.   The test report has been scanned into EPIC and is located under the Molecular Pathology section of the Results Review tab.  A portion of the result report is included below for reference.      Faith Rogue, MS, [facility] Genetic Counselor [email or web address] Phone: [phone]

## 2022-06-05 NOTE — Progress Notes (Signed)
HPI:   Marilyn Ford was previously seen in the [place] clinic due to a personal and family history of cancer and concerns regarding a hereditary predisposition to cancer. Please refer to our prior cancer genetics clinic note for more information regarding our discussion, assessment and recommendations, at the time. Ms. Kinlaw recent genetic test results were disclosed to her, as were recommendations warranted by these results. These results and recommendations are discussed in more detail below.  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 The patient initially followed up with surgery in April however canceled her scheduled surgery and most recently followed up with Dr. [name] September 2023 with diagnostic imaging, possible RF tag placement and subsequent excision recommended.   EXAM: DIGITAL DIAGNOSTIC BILATERAL MAMMOGRAM WITH TOMOSYNTHESIS; ULTRASOUND RIGHT BREAST LIMITED  MPRESSION: 1.  Suspicious right axillary lymphadenopathy.   2. Indeterminate intramammary lymph node in the right breast at 9 o'clock.   3. 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   4. Decreased calcifications noted at prior benign biopsy site in  the lower central posterior right breast at site of X shaped biopsy marking clip.   02/20/2022 Initial Biopsy   FINAL MICROSCOPIC DIAGNOSIS:   A. AXILLA, RIGHT, LYMPH NODE, NEEDLE CORE BIOPSY:  - Positive for carcinoma (see Comment)   B. LYMPH NODE, RIGHT BREAST, BIOPSY:  - Negative for carcinoma   COMMENT:  Part A: Morphology and immunohistochemical staining are most compatible with primary breast carcinoma with metaplastic changes, however differential diagnosis also includes urothelial carcinoma and less likely primary lung carcinoma (squamous).  No lymphoid tissue is identified.  Clinical and radiologic correlation is suggested.   ADDENDUM:  In case of a breast origin, the appropriate grade would be grade 3  (3+3+2)   ADDENDUM:  PROGNOSTIC INDICATOR RESULTS:  The tumor cells are EQUIVOCAL for Her2 (2+).  Estrogen Receptor:       0%, NEGATIVE  Progesterone Receptor:   0%, NEGATIVE  Proliferation Marker Ki-67:   60%   ADDENDUM:  FLOURESCENCE IN-SITU HYBRIDIZATION RESULTS:  GROUP 5:   HER2 **NEGATIVE**    03/02/2022 Imaging   EXAM: BILATERAL BREAST MRI WITH AND WITHOUT CONTRAST  IMPRESSION: 1. There is a suspicious 3.8 cm area of patchy non mass enhancement in a linear orientation in the slightly upper outer right breast in the mid to posterior depth spanning 3.8 cm.   2. Diffuse skin thickening with enhancement of the skin, concerning for inflammatory breast cancer.   3. Numerous bulky matted lymph nodes in the right axilla, one of which corresponds with the biopsy-proven metastatic lymph node.   4.  No evidence of left breast malignancy.   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 ([place])   03/08/2022 Cancer Staging   Staging form: Breast, AJCC 8th Edition -  Clinical stage from 03/08/2022: Stage IIIC (cT4d, cN2, cM0, G3, ER-, PR-, HER2-) - Signed by Truitt Merle, MD on 03/08/2022 Histologic grading system: 3 grade  system   03/19/2022 Pathology Results   Diagnosis Breast, right, needle core biopsy, upper outer quadrant, barbell clip BENIGN BREAST WITH FIBROCYSTIC CHANGES INCLUDING STROMAL FIBROSIS, ADENOSIS AND USUAL DUCT HYPERPLASIA BENIGN FIBROMATOID CHANGE NEGATIVE FOR MICROCALCIFICATIONS NEGATIVE FOR CARCINOMA   03/22/2022 PET scan   IMPRESSION: Bulky hypermetabolic right axillary and subpectoral lymphadenopathy, consistent with metastatic disease.   No other definite sites of metastatic disease identified.   6 mm right lower lobe pulmonary nodule shows no FDG uptake, but is too small to definitively characterize by PET. Recommend continued follow-up by chest CT in 3-4 months.   Aortic Atherosclerosis (ICD10-I70.0).   04/02/2022 -  Chemotherapy   Patient is on Treatment Plan : BREAST Pembrolizumab (200) D1 + Carboplatin (5) D1 + Paclitaxel (80) D1,8,15 q21d X 4 cycles / Pembrolizumab (200) D1 + AC D1 q21d x 4 cycles      Genetic Testing   Negative genetic testing. No pathogenic variants identified on the [facility] Common Hereditary Cancers+RNA panel. The report date is 05/31/2022.  The Common Hereditary Cancers Panel + RNA offered by [facility] includes sequencing and/or deletion duplication testing of the following 48 genes: APC*, ATM*, AXIN2, BAP1, BARD1, BMPR1A, BRCA1, BRCA2, BRIP1, CDH1, CDK4, CDKN2A (p14ARF), CDKN2A (p16INK4a), CHEK2, CTNNA1, DICER1*, EPCAM*, FH*, GREM1*, HOXB13, KIT, MBD4, MEN1*, MLH1*, MSH2*, MSH3*, MSH6*, MUTYH, NF1*, NTHL1, PALB2, PDGFRA, PMS2*, POLD1*, POLE, PTEN*, RAD51C, RAD51D, SDHA*, SDHB, SDHC*, SDHD, SMAD4, SMARCA4, STK11, TP53, TSC1*, TSC2, VHL.      FAMILY HISTORY:  We obtained a detailed, 4-generation family history.  Significant diagnoses are listed below: Family History  Problem Relation Age of Onset   Diabetes Mother    Uterine cancer Mother 67 - 57   Diabetes Brother    Breast cancer Maternal Aunt        dx >50, d. from cancer   Cancer Maternal  Grandmother        unk type, "back cancer?"     Ms. Foister has 1 son, 50, and 1 daughter, Larene Beach, 32. She has 3 maternal half sisters, 2 maternal half brothers, no cancers.   Ms. Cuellar mother is living at 18, she had uterine cancer in her 31s-40s. Patient had 2 maternal aunts and 1 uncle. Her aunt had breast cancer over age 37 and passed from it. Maternal grandmother had cancer, unknown type, possibly related to her back.    Ms. Chismar father died at 62. She has limited information about that side of the family, no known cancers.   Ms. Moncur is unaware of previous family history of genetic testing for hereditary cancer risks. There is no reported Ashkenazi Jewish ancestry. There is no known consanguinity.      GENETIC TEST RESULTS:  The [facility] Common Hereditary Cancers+RNA Panel found no pathogenic mutations.   The Common Hereditary Cancers Panel + RNA offered by [facility] includes sequencing and/or deletion duplication testing of the following 48 genes: APC*, ATM*, AXIN2, BAP1, BARD1, BMPR1A, BRCA1, BRCA2, BRIP1, CDH1, CDK4, CDKN2A (p14ARF), CDKN2A (p16INK4a), CHEK2, CTNNA1, DICER1*, EPCAM*, FH*, GREM1*, HOXB13, KIT, MBD4, MEN1*, MLH1*, MSH2*, MSH3*, MSH6*, MUTYH, NF1*, NTHL1, PALB2, PDGFRA, PMS2*, POLD1*, POLE, PTEN*, RAD51C, RAD51D, SDHA*, SDHB, SDHC*, SDHD, SMAD4, SMARCA4, STK11, TP53, TSC1*, TSC2, VHL.   The test report has been scanned into EPIC and is located under the Molecular Pathology section of the Results Review tab.  A  portion of the result report is included below for reference. Genetic testing reported out on 05/31/2022.     Even though a pathogenic variant was not identified, possible explanations for the cancer in the family may include: There may be no hereditary risk for cancer in the family. The cancers in Ms. Mendizabal and/or her family may be sporadic/familial or due to other genetic and environmental factors. There may be a gene mutation in one of these  genes that current testing methods cannot detect but that chance is small. There could be another gene that has not yet been discovered, or that we have not yet tested, that is responsible for the cancer diagnoses in the family.  It is also possible there is a hereditary cause for the cancer in the family that Ms. Mathisen did not inherit.  Therefore, it is important to remain in touch with cancer genetics in the future so that we can continue to offer Ms. Pinson the most up to date genetic testing.   ADDITIONAL GENETIC TESTING:  We discussed with Ms. Hinde that her genetic testing was fairly extensive.  If there are additional relevant genes identified to increase cancer risk that can be analyzed in the future, we would be happy to discuss and coordinate this testing at that time.    CANCER SCREENING RECOMMENDATIONS:  Ms. Schendel test result is considered negative (normal).  This means that we have not identified a hereditary cause for her personal and family history of cancer at this time.   An individual's cancer risk and medical management are not determined by genetic test results alone. Overall cancer risk assessment incorporates additional factors, including personal medical history, family history, and any available genetic information that may result in a personalized plan for cancer prevention and surveillance. Therefore, it is recommended she continue to follow the cancer management and screening guidelines provided by her oncology and primary healthcare provider.  RECOMMENDATIONS FOR FAMILY MEMBERS:   Since she did not inherit a identifiable mutation in a cancer predisposition gene included on this panel, her children could not have inherited a known mutation from her in one of these genes. Individuals in this family might be at some increased risk of developing cancer, over the general population risk, due to the family history of cancer.  Individuals in the family should notify  their providers of the family history of cancer. We recommend women in this family have a yearly mammogram beginning at age 67, or 94 years younger than the earliest onset of cancer, an annual clinical breast exam, and perform monthly breast self-exams.  Family members should have colonoscopies by at age 63, or earlier, as recommended by their providers. Other members of the family may still carry a pathogenic variant in one of these genes that Ms. Hallstrom did not inherit. Based on the family history, we recommend her mother, who was diagnosed with endometrial cancer at age 32-40, have genetic counseling and testing. Ms. Marcott will let us know if we can be of any assistance in coordinating genetic counseling and/or testing for this family member.    FOLLOW-UP:  Lastly, we discussed with Ms. Hovsepian that cancer genetics is a rapidly advancing field and it is possible that new genetic tests will be appropriate for her and/or her family members in the future. We encouraged her to remain in contact with cancer genetics on an annual basis so we can update her personal and family histories and let her know of advances in cancer genetics that  may benefit this family.   Our contact number was provided. Ms. Hillebrand questions were answered to her satisfaction, and she knows she is welcome to call us at anytime with additional questions or concerns.    Faith Rogue, MS, [facility] Genetic Counselor [email or web address] Phone: [phone]

## 2022-06-06 ENCOUNTER — Inpatient Hospital Stay: Payer: No Typology Code available for payment source

## 2022-06-06 ENCOUNTER — Other Ambulatory Visit: Payer: No Typology Code available for payment source

## 2022-06-06 VITALS — BP 124/65 | HR 81 | Temp 97.4°F | Resp 18

## 2022-06-06 DIAGNOSIS — Z171 Estrogen receptor negative status [ER-]: Secondary | ICD-10-CM

## 2022-06-06 DIAGNOSIS — Z5112 Encounter for antineoplastic immunotherapy: Secondary | ICD-10-CM | POA: Diagnosis not present

## 2022-06-06 MED ORDER — HEPARIN SOD (PORK) LOCK FLUSH 100 UNIT/ML IV SOLN
500.0000 [IU] | Freq: Once | INTRAVENOUS | Status: AC | PRN
  Administered 2022-06-06: 500 [IU]

## 2022-06-06 MED ORDER — PALONOSETRON HCL INJECTION 0.25 MG/5ML
0.2500 mg | Freq: Once | INTRAVENOUS | Status: AC
  Administered 2022-06-06: 0.25 mg via INTRAVENOUS
  Filled 2022-06-06: qty 5

## 2022-06-06 MED ORDER — SODIUM CHLORIDE 0.9 % IV SOLN
200.0000 mg | Freq: Once | INTRAVENOUS | Status: AC
  Administered 2022-06-06: 200 mg via INTRAVENOUS
  Filled 2022-06-06: qty 8

## 2022-06-06 MED ORDER — SODIUM CHLORIDE 0.9 % IV SOLN
150.0000 mg | Freq: Once | INTRAVENOUS | Status: AC
  Administered 2022-06-06: 150 mg via INTRAVENOUS
  Filled 2022-06-06: qty 150

## 2022-06-06 MED ORDER — SODIUM CHLORIDE 0.9 % IV SOLN: Freq: Once | INTRAVENOUS | Status: AC

## 2022-06-06 MED ORDER — SODIUM CHLORIDE 0.9% FLUSH
10.0000 mL | INTRAVENOUS | Status: DC | PRN
  Administered 2022-06-06: 10 mL

## 2022-06-06 MED ORDER — CETIRIZINE HCL 10 MG/ML IV SOLN
10.0000 mg | Freq: Once | INTRAVENOUS | Status: AC
  Administered 2022-06-06: 10 mg via INTRAVENOUS
  Filled 2022-06-06: qty 1

## 2022-06-06 MED ORDER — SODIUM CHLORIDE 0.9 % IV SOLN
80.0000 mg/m2 | Freq: Once | INTRAVENOUS | Status: AC
  Administered 2022-06-06: 150 mg via INTRAVENOUS
  Filled 2022-06-06: qty 25

## 2022-06-06 MED ORDER — FAMOTIDINE IN NACL 20-0.9 MG/50ML-% IV SOLN
20.0000 mg | Freq: Once | INTRAVENOUS | Status: AC
  Administered 2022-06-06: 20 mg via INTRAVENOUS
  Filled 2022-06-06: qty 50

## 2022-06-06 MED ORDER — SODIUM CHLORIDE 0.9 % IV SOLN
520.0000 mg | Freq: Once | INTRAVENOUS | Status: AC
  Administered 2022-06-06: 500 mg via INTRAVENOUS
  Filled 2022-06-06: qty 50

## 2022-06-06 NOTE — Progress Notes (Signed)
Patient presents today for Keytruda/Taxol/Carboplatin infusion per providers order.  Vital signs and labs (06/04/22) within parameters for treatment.  Patient has no new complaints at this time.  Treatment given today per MD orders.  Stable during infusion without adverse affects.  Vital signs stable.  No complaints at this time.  Discharge from clinic ambulatory in stable condition.  Alert and oriented X 3.  Follow up with [facility] as scheduled.

## 2022-06-06 NOTE — Patient Instructions (Signed)
[facility]  Discharge Instructions: Thank you for choosing [facility] to provide your oncology and hematology care.  If you have a lab appointment with the [facility], please come in thru the Main Entrance and check in at the main information desk.  Wear comfortable clothing and clothing appropriate for easy access to any Portacath or PICC line.   We strive to give you quality time with your provider. You may need to reschedule your appointment if you arrive late (15 or more minutes).  Arriving late affects you and other patients whose appointments are after yours.  Also, if you miss three or more appointments without notifying the office, you may be dismissed from the clinic at the provider's discretion.      For prescription refill requests, have your pharmacy contact our office and allow 72 hours for refills to be completed.    Today you received the following chemotherapy and/or immunotherapy agents Keytruda/Taxol/Carboplatin      To help prevent nausea and vomiting after your treatment, we encourage you to take your nausea medication as directed.  BELOW ARE SYMPTOMS THAT SHOULD BE REPORTED IMMEDIATELY: *FEVER GREATER THAN 100.4 F (38 C) OR HIGHER *CHILLS OR SWEATING *NAUSEA AND VOMITING THAT IS NOT CONTROLLED WITH YOUR NAUSEA MEDICATION *UNUSUAL SHORTNESS OF BREATH *UNUSUAL BRUISING OR BLEEDING *URINARY PROBLEMS (pain or burning when urinating, or frequent urination) *BOWEL PROBLEMS (unusual diarrhea, constipation, pain near the anus) TENDERNESS IN MOUTH AND THROAT WITH OR WITHOUT PRESENCE OF ULCERS (sore throat, sores in mouth, or a toothache) UNUSUAL RASH, SWELLING OR PAIN  UNUSUAL VAGINAL DISCHARGE OR ITCHING   Items with * indicate a potential emergency and should be followed up as soon as possible or go to the Emergency Department if any problems should occur.  Please show the CHEMOTHERAPY ALERT CARD or IMMUNOTHERAPY ALERT CARD at  check-in to the Emergency Department and triage nurse.  Should you have questions after your visit or need to cancel or reschedule your appointment, please contact [facility] [phone]  and follow the prompts.  Office hours are 8:00 a.m. to 4:30 p.m. Monday - Friday. Please note that voicemails left after 4:00 p.m. may not be returned until the following business day.  We are closed weekends and major holidays. You have access to a nurse at all times for urgent questions. Please call the main number to the clinic [phone] and follow the prompts.  For any non-urgent questions, you may also contact your provider using MyChart. We now offer e-Visits for anyone 56 and older to request care online for non-urgent symptoms. For details visit [email or web address].   Also download the MyChart app! Go to the app store, search "MyChart", open the app, select [facility], and log in with your MyChart username and password.

## 2022-06-06 NOTE — Progress Notes (Signed)
Improvement in creatinine per MD ok to increase carboplatin dose to 520 mg - EPIC is rounding to 500 mg within 10% orders updated.  T.O. Dr Rhys Martini, PharmD

## 2022-06-07 ENCOUNTER — Inpatient Hospital Stay: Payer: No Typology Code available for payment source

## 2022-06-07 ENCOUNTER — Inpatient Hospital Stay: Payer: No Typology Code available for payment source | Attending: Hematology and Oncology

## 2022-06-07 DIAGNOSIS — Z833 Family history of diabetes mellitus: Secondary | ICD-10-CM | POA: Diagnosis not present

## 2022-06-07 DIAGNOSIS — Z5111 Encounter for antineoplastic chemotherapy: Secondary | ICD-10-CM | POA: Insufficient documentation

## 2022-06-07 DIAGNOSIS — Z8616 Personal history of COVID-19: Secondary | ICD-10-CM | POA: Diagnosis not present

## 2022-06-07 DIAGNOSIS — Z171 Estrogen receptor negative status [ER-]: Secondary | ICD-10-CM | POA: Diagnosis not present

## 2022-06-07 DIAGNOSIS — Z87891 Personal history of nicotine dependence: Secondary | ICD-10-CM | POA: Insufficient documentation

## 2022-06-07 DIAGNOSIS — Z5189 Encounter for other specified aftercare: Secondary | ICD-10-CM | POA: Insufficient documentation

## 2022-06-07 DIAGNOSIS — Z79899 Other long term (current) drug therapy: Secondary | ICD-10-CM | POA: Insufficient documentation

## 2022-06-07 DIAGNOSIS — R1110 Vomiting, unspecified: Secondary | ICD-10-CM | POA: Insufficient documentation

## 2022-06-07 DIAGNOSIS — Z79620 Long term (current) use of immunosuppressive biologic: Secondary | ICD-10-CM | POA: Insufficient documentation

## 2022-06-07 DIAGNOSIS — R059 Cough, unspecified: Secondary | ICD-10-CM | POA: Insufficient documentation

## 2022-06-07 DIAGNOSIS — Z803 Family history of malignant neoplasm of breast: Secondary | ICD-10-CM | POA: Diagnosis not present

## 2022-06-07 DIAGNOSIS — R197 Diarrhea, unspecified: Secondary | ICD-10-CM | POA: Insufficient documentation

## 2022-06-07 DIAGNOSIS — C50811 Malignant neoplasm of overlapping sites of right female breast: Secondary | ICD-10-CM | POA: Diagnosis not present

## 2022-06-07 LAB — CBC WITH DIFFERENTIAL/PLATELET
Abs Immature Granulocytes: 0.16 10*3/uL — ABNORMAL HIGH (ref 0.00–0.07)
Basophils Absolute: 0 10*3/uL (ref 0.0–0.1)
Basophils Relative: 1 %
Eosinophils Absolute: 0 10*3/uL (ref 0.0–0.5)
Eosinophils Relative: 1 %
HCT: 30 % — ABNORMAL LOW (ref 36.0–46.0)
Hemoglobin: 9.8 g/dL — ABNORMAL LOW (ref 12.0–15.0)
Immature Granulocytes: 3 %
Lymphocytes Relative: 20 %
Lymphs Abs: 0.9 10*3/uL (ref 0.7–4.0)
MCH: 30.4 pg (ref 26.0–34.0)
MCHC: 32.7 g/dL (ref 30.0–36.0)
MCV: 93.2 fL (ref 80.0–100.0)
Monocytes Absolute: 0.3 10*3/uL (ref 0.1–1.0)
Monocytes Relative: 7 %
Neutro Abs: 3.2 10*3/uL (ref 1.7–7.7)
Neutrophils Relative %: 68 %
Platelets: 142 10*3/uL — ABNORMAL LOW (ref 150–400)
RBC: 3.22 MIL/uL — ABNORMAL LOW (ref 3.87–5.11)
RDW: 20.6 % — ABNORMAL HIGH (ref 11.5–15.5)
WBC: 4.7 10*3/uL (ref 4.0–10.5)
nRBC: 0 % (ref 0.0–0.2)

## 2022-06-07 MED ORDER — HEPARIN SOD (PORK) LOCK FLUSH 100 UNIT/ML IV SOLN
500.0000 [IU] | Freq: Once | INTRAVENOUS | Status: AC
  Administered 2022-06-07: 500 [IU] via INTRAVENOUS

## 2022-06-07 MED ORDER — SODIUM CHLORIDE 0.9% FLUSH
10.0000 mL | Freq: Once | INTRAVENOUS | Status: AC
  Administered 2022-06-07: 10 mL via INTRAVENOUS

## 2022-06-07 NOTE — Progress Notes (Signed)
Patients port flushed without difficulty.  Good blood return noted with no bruising or swelling noted at site.  Band aid applied.  VSS with discharge and left in satisfactory condition with no s/s of distress noted.   

## 2022-06-07 NOTE — Progress Notes (Signed)
No injection needed this week. Patient notified and left in satisfactory condition.

## 2022-06-08 ENCOUNTER — Inpatient Hospital Stay: Payer: No Typology Code available for payment source

## 2022-06-11 ENCOUNTER — Inpatient Hospital Stay: Payer: No Typology Code available for payment source

## 2022-06-11 ENCOUNTER — Encounter: Payer: Self-pay | Admitting: *Deleted

## 2022-06-11 ENCOUNTER — Encounter: Payer: Self-pay | Admitting: Family Medicine

## 2022-06-11 ENCOUNTER — Ambulatory Visit (INDEPENDENT_AMBULATORY_CARE_PROVIDER_SITE_OTHER): Payer: No Typology Code available for payment source | Admitting: Family Medicine

## 2022-06-11 VITALS — BP 136/83 | HR 93 | Temp 97.3°F | Ht 63.0 in | Wt 149.4 lb

## 2022-06-11 DIAGNOSIS — E118 Type 2 diabetes mellitus with unspecified complications: Secondary | ICD-10-CM

## 2022-06-11 DIAGNOSIS — E119 Type 2 diabetes mellitus without complications: Secondary | ICD-10-CM | POA: Diagnosis not present

## 2022-06-11 DIAGNOSIS — G4701 Insomnia due to medical condition: Secondary | ICD-10-CM | POA: Diagnosis not present

## 2022-06-11 DIAGNOSIS — I10 Essential (primary) hypertension: Secondary | ICD-10-CM

## 2022-06-11 DIAGNOSIS — E782 Mixed hyperlipidemia: Secondary | ICD-10-CM | POA: Diagnosis not present

## 2022-06-11 LAB — CBC WITH DIFFERENTIAL/PLATELET
Basophils Absolute: 0 10*3/uL (ref 0.0–0.2)
Basos: 1 %
EOS (ABSOLUTE): 0 10*3/uL (ref 0.0–0.4)
Eos: 1 %
Hematocrit: 29.8 % — ABNORMAL LOW (ref 34.0–46.6)
Hemoglobin: 9.7 g/dL — ABNORMAL LOW (ref 11.1–15.9)
Immature Grans (Abs): 0 10*3/uL (ref 0.0–0.1)
Immature Granulocytes: 0 %
Lymphocytes Absolute: 0.9 10*3/uL (ref 0.7–3.1)
Lymphs: 24 %
MCH: 30 pg (ref 26.6–33.0)
MCHC: 32.6 g/dL (ref 31.5–35.7)
MCV: 92 fL (ref 79–97)
Monocytes Absolute: 0.1 10*3/uL (ref 0.1–0.9)
Monocytes: 2 %
Neutrophils Absolute: 2.5 10*3/uL (ref 1.4–7.0)
Neutrophils: 72 %
Platelets: 169 10*3/uL (ref 150–450)
RBC: 3.23 x10E6/uL — ABNORMAL LOW (ref 3.77–5.28)
RDW: 20.3 % — ABNORMAL HIGH (ref 11.7–15.4)
WBC: 3.5 10*3/uL (ref 3.4–10.8)

## 2022-06-11 LAB — CMP14+EGFR
ALT: 91 IU/L — ABNORMAL HIGH (ref 0–32)
AST: 50 IU/L — ABNORMAL HIGH (ref 0–40)
Albumin/Globulin Ratio: 1.9 (ref 1.2–2.2)
Albumin: 4.2 g/dL (ref 3.9–4.9)
Alkaline Phosphatase: 190 IU/L — ABNORMAL HIGH (ref 44–121)
BUN/Creatinine Ratio: 29 — ABNORMAL HIGH (ref 12–28)
BUN: 27 mg/dL (ref 8–27)
Bilirubin Total: 0.6 mg/dL (ref 0.0–1.2)
CO2: 22 mmol/L (ref 20–29)
Calcium: 11 mg/dL — ABNORMAL HIGH (ref 8.7–10.3)
Chloride: 100 mmol/L (ref 96–106)
Creatinine, Ser: 0.93 mg/dL (ref 0.57–1.00)
Globulin, Total: 2.2 g/dL (ref 1.5–4.5)
Glucose: 140 mg/dL — ABNORMAL HIGH (ref 70–99)
Potassium: 5 mmol/L (ref 3.5–5.2)
Sodium: 137 mmol/L (ref 134–144)
Total Protein: 6.4 g/dL (ref 6.0–8.5)
eGFR: 70 mL/min/{1.73_m2} (ref >59)

## 2022-06-11 LAB — LIPID PANEL
Chol/HDL Ratio: 5.5 ratio — ABNORMAL HIGH (ref 0.0–4.4)
Cholesterol, Total: 153 mg/dL (ref 100–199)
HDL: 28 mg/dL — ABNORMAL LOW (ref >39)
LDL Chol Calc (NIH): 66 mg/dL (ref 0–99)
Triglycerides: 379 mg/dL — ABNORMAL HIGH (ref 0–149)
VLDL Cholesterol Cal: 59 mg/dL — ABNORMAL HIGH (ref 5–40)

## 2022-06-11 LAB — BAYER DCA HB A1C WAIVED: HB A1C (BAYER DCA - WAIVED): 6.3 % — ABNORMAL HIGH (ref 4.8–5.6)

## 2022-06-11 MED ORDER — VALSARTAN 320 MG PO TABS: 320.0000 mg | ORAL_TABLET | Freq: Every day | ORAL | 1 refills | Status: DC

## 2022-06-11 MED ORDER — TRAZODONE HCL 150 MG PO TABS: ORAL_TABLET | ORAL | 3 refills | Status: DC

## 2022-06-11 MED ORDER — METOPROLOL SUCCINATE ER 200 MG PO TB24: ORAL_TABLET | ORAL | 3 refills | Status: DC

## 2022-06-11 MED ORDER — TRULICITY 4.5 MG/0.5ML ~~LOC~~ SOAJ: 4.5000 mg | SUBCUTANEOUS | 1 refills | Status: DC

## 2022-06-11 MED ORDER — AMLODIPINE BESYLATE 10 MG PO TABS: ORAL_TABLET | ORAL | 3 refills | Status: DC

## 2022-06-11 MED ORDER — ATORVASTATIN CALCIUM 80 MG PO TABS: ORAL_TABLET | ORAL | 3 refills | Status: DC

## 2022-06-11 NOTE — Progress Notes (Signed)
Subjective:  Patient ID: Marilyn Ford, female    DOB: June 24, 1960  Age: 62 y.o. MRN: [id number]  CC: Medical Management of Chronic Issues   HPI DALANI METTE presents forFollow-up of diabetes. Patient checks blood sugar at home.   136 fasting  Patient denies symptoms such as polyuria, polydipsia, excessive hunger, nausea No significant hypoglycemic spells noted. Medications reviewed. Pt reports taking them regularly without complication/adverse reaction being reported today.    presents for  follow-up of hypertension. Patient has no history of headache chest pain or shortness of breath or recent cough. Patient also denies symptoms of TIA such as focal numbness or weakness. Patient denies side effects from medication. States taking it regularly.   in for follow-up of elevated cholesterol. Doing well without complaints on current medication. Denies side effects of statin including myalgia and arthralgia and nausea. Currently no chest pain, shortness of breath or other cardiovascular related symptoms noted.     History Dekisha has a past medical history of Allergy, Anxiety, Breast cancer ([place]), Diabetes mellitus without complication ([place]), GERD (gastroesophageal reflux disease), Heart murmur, History of kidney stones, Hypertension, Pneumonia, Stroke ([place]), and Vaginal Pap smear, abnormal.   She has a past surgical history that includes Cholecystectomy; Cesarean section; Colonoscopy with propofol (N/A, 07/19/2020); polypectomy (07/19/2020); and Portacath placement (N/A, 03/20/2022).   Her family history includes Breast cancer in her maternal aunt; Cancer in her maternal grandmother; Diabetes in her brother and mother; Uterine cancer (age of onset: 69 - 58) in her mother.She reports that she quit smoking about 7 years ago. Her smoking use included cigarettes. She started smoking about 22 years ago. She has a 12.50 pack-year smoking history. She has never used smokeless tobacco. She reports  current alcohol use of about 2.0 - 3.0 standard drinks of alcohol per week. She reports that she does not use drugs.  Current Outpatient Medications on File Prior to Visit  Medication Sig Dispense Refill   Alcohol Swabs (B-D SINGLE USE SWABS REGULAR) PADS Test BS daily and as needed Dx E11.9 100 each 3   Apple Cider Vinegar 500 MG TABS Take 500 mg by mouth in the morning.     aspirin 81 MG EC tablet Take 1 tablet (81 mg total) by mouth daily. 30 tablet 0   Blood Glucose Calibration (TRUE METRIX LEVEL 1) Low SOLN Use with glucometer Dx E11.9 3 each 0   Blood Glucose Monitoring Suppl (TRUE METRIX AIR GLUCOSE METER) w/Device KIT Test BS daily and as needed Dx E11.9 1 kit 0   Calcium Carb-Cholecalciferol (CALCIUM-VITAMIN D) 600-400 MG-UNIT TABS Take 1 tablet by mouth in the morning.     clopidogrel (PLAVIX) 75 MG tablet Take 75 mg by mouth daily.     Coenzyme Q10 (COQ10) 100 MG CAPS Take 100 mg by mouth in the morning.     Cranberry 425 MG CAPS Take 425 mg by mouth in the morning and at bedtime.     Flaxseed, Linseed, (FLAXSEED OIL) 1000 MG CAPS Take 1,000 mg by mouth in the morning.     glucose blood (TRUE METRIX BLOOD GLUCOSE TEST) test strip Test BS daily and as needed Dx E11.9 100 each 3   icosapent Ethyl (VASCEPA) 1 g capsule Take 2 g by mouth 2 (two) times daily.     Krill Oil 500 MG CAPS Take 3 capsules (1,500 mg total) by mouth in the morning and at bedtime.     lidocaine-prilocaine (EMLA) cream Apply to affected area once 30  g 3   LORazepam (ATIVAN) 1 MG tablet Take 1 mg by mouth every 8 (eight) hours as needed.     magnesium oxide (MAG-OX) 400 (240 Mg) MG tablet TAKE 1 TABLET BY MOUTH TWICE A DAY 60 tablet 1   meloxicam (MOBIC) 15 MG tablet Take 15 mg by mouth daily.     Multiple Vitamin (MULTIVITAMIN) capsule Take 1 capsule by mouth in the morning.     ondansetron (ZOFRAN) 8 MG tablet Take 1 tablet (8 mg total) by mouth every 8 (eight) hours as needed for nausea or vomiting. Start on the  third day after chemotherapy. 30 tablet 1   pantoprazole (PROTONIX) 40 MG tablet TAKE 1 TABLET twice daily FOR STOMACH 180 tablet 3   prochlorperazine (COMPAZINE) 10 MG tablet Take 1 tablet (10 mg total) by mouth every 6 (six) hours as needed for nausea or vomiting. 30 tablet 1   traMADol (ULTRAM) 50 MG tablet Take 1 tablet (50 mg total) by mouth every 6 (six) hours as needed for moderate pain or severe pain. 20 tablet 0   TRUEplus Lancets 33G MISC Test BS daily and as needed Dx E11.9 100 each 3   zinc gluconate 50 MG tablet Take 50 mg by mouth daily.     No current facility-administered medications on file prior to visit.    ROS Review of Systems  Constitutional: Negative.   HENT: Negative.    Eyes:  Negative for visual disturbance.  Respiratory:  Negative for shortness of breath.   Cardiovascular:  Negative for chest pain.  Gastrointestinal:  Negative for abdominal pain.  Musculoskeletal:  Negative for arthralgias.    Objective:  BP 136/83   Pulse 93   Temp (!) 97.3 F (36.3 C)   Ht '5\' 3"'$  (1.6 m)   Wt 149 lb 6.4 oz (67.8 kg)   SpO2 97%   BMI 26.47 kg/m   BP Readings from Last 3 Encounters:  06/11/22 136/83  06/07/22 123/70  06/06/22 124/65    Wt Readings from Last 3 Encounters:  06/11/22 149 lb 6.4 oz (67.8 kg)  06/04/22 151 lb 9.6 oz (68.8 kg)  05/29/22 154 lb 14.4 oz (70.3 kg)     Physical Exam Constitutional:      General: She is not in acute distress.    Appearance: She is well-developed.  Cardiovascular:     Rate and Rhythm: Normal rate and regular rhythm.  Pulmonary:     Breath sounds: Normal breath sounds.  Musculoskeletal:        General: Normal range of motion.  Skin:    General: Skin is warm and dry.  Neurological:     Mental Status: She is alert and oriented to person, place, and time.       Assessment & Plan:   Gwynne was seen today for medical management of chronic issues.  Diagnoses and all orders for this visit:  Type 2 diabetes  mellitus without complication, without long-term current use of insulin (HCC) -     Bayer DCA Hb A1c Waived  Primary hypertension -     CBC with Differential/Platelet -     CMP14+EGFR  Mixed hyperlipidemia -     Lipid panel -     atorvastatin (LIPITOR) 80 MG tablet; TAKE 1 TABLET EVERY DAY AT 6PM  Essential hypertension -     amLODipine (NORVASC) 10 MG tablet; 1 tablet daily -     metoprolol (TOPROL-XL) 200 MG 24 hr tablet; TAKE 1 TABLET ONE TIME DAILY,  WITH OR IMMEDIATELY FOLLOWING A MEAL  Controlled type 2 diabetes mellitus with complication, without long-term current use of insulin (HCC) -     atorvastatin (LIPITOR) 80 MG tablet; TAKE 1 TABLET EVERY DAY AT 6PM -     Dulaglutide (TRULICITY) 4.5 VP/7.1GG SOPN; Inject 4.5 mg as directed once a week.  Insomnia due to medical condition -     traZODone (DESYREL) 150 MG tablet; TAKE 1 OR 2 TABLETS AT BEDTIME FOR SLEEP  Other orders -     valsartan (DIOVAN) 320 MG tablet; Take 1 tablet (320 mg total) by mouth daily. For blood pressure.      I am having Sanaiya Welliver. Kye maintain her multivitamin, aspirin EC, Flaxseed Oil, Apple Cider Vinegar, Calcium-Vitamin D, zinc gluconate, CoQ10, Cranberry, Krill Oil, True Metrix Air Glucose Meter, True Metrix Blood Glucose Test, TRUEplus Lancets 33G, True Metrix Level 1, B-D SINGLE USE SWABS REGULAR, pantoprazole, traMADol, ondansetron, prochlorperazine, lidocaine-prilocaine, clopidogrel, icosapent Ethyl, LORazepam, meloxicam, magnesium oxide, amLODipine, atorvastatin, metoprolol, traZODone, valsartan, and Trulicity.  Meds ordered this encounter  Medications   amLODipine (NORVASC) 10 MG tablet    Sig: 1 tablet daily    Dispense:  90 tablet    Refill:  3   atorvastatin (LIPITOR) 80 MG tablet    Sig: TAKE 1 TABLET EVERY DAY AT 6PM    Dispense:  90 tablet    Refill:  3   metoprolol (TOPROL-XL) 200 MG 24 hr tablet    Sig: TAKE 1 TABLET ONE TIME DAILY, WITH OR IMMEDIATELY FOLLOWING A MEAL     Dispense:  90 tablet    Refill:  3   traZODone (DESYREL) 150 MG tablet    Sig: TAKE 1 OR 2 TABLETS AT BEDTIME FOR SLEEP    Dispense:  180 tablet    Refill:  3   valsartan (DIOVAN) 320 MG tablet    Sig: Take 1 tablet (320 mg total) by mouth daily. For blood pressure.    Dispense:  90 tablet    Refill:  1   Dulaglutide (TRULICITY) 4.5 YI/9.4WN SOPN    Sig: Inject 4.5 mg as directed once a week.    Dispense:  6 mL    Refill:  1     Follow-up: Return in about 3 months (around 09/09/2022).  Claretta Fraise, M.D.

## 2022-06-12 ENCOUNTER — Other Ambulatory Visit: Payer: Self-pay

## 2022-06-12 ENCOUNTER — Telehealth: Payer: Self-pay | Admitting: Family Medicine

## 2022-06-13 ENCOUNTER — Other Ambulatory Visit: Payer: Self-pay | Admitting: Hematology

## 2022-06-13 ENCOUNTER — Inpatient Hospital Stay: Payer: No Typology Code available for payment source

## 2022-06-13 ENCOUNTER — Other Ambulatory Visit: Payer: Self-pay | Admitting: *Deleted

## 2022-06-13 VITALS — BP 132/73 | HR 97 | Temp 97.8°F | Resp 18 | Wt 148.8 lb

## 2022-06-13 VITALS — BP 128/68 | HR 86 | Temp 97.7°F | Resp 18

## 2022-06-13 DIAGNOSIS — D702 Other drug-induced agranulocytosis: Secondary | ICD-10-CM

## 2022-06-13 DIAGNOSIS — C50811 Malignant neoplasm of overlapping sites of right female breast: Secondary | ICD-10-CM

## 2022-06-13 DIAGNOSIS — Z5111 Encounter for antineoplastic chemotherapy: Secondary | ICD-10-CM | POA: Diagnosis not present

## 2022-06-13 DIAGNOSIS — N6313 Unspecified lump in the right breast, lower outer quadrant: Secondary | ICD-10-CM

## 2022-06-13 DIAGNOSIS — Z95828 Presence of other vascular implants and grafts: Secondary | ICD-10-CM

## 2022-06-13 LAB — COMPREHENSIVE METABOLIC PANEL
ALT: 60 U/L — ABNORMAL HIGH (ref 0–44)
AST: 33 U/L (ref 15–41)
Albumin: 3.9 g/dL (ref 3.5–5.0)
Alkaline Phosphatase: 138 U/L — ABNORMAL HIGH (ref 38–126)
Anion gap: 10 (ref 5–15)
BUN: 32 mg/dL — ABNORMAL HIGH (ref 8–23)
CO2: 26 mmol/L (ref 22–32)
Calcium: 9.9 mg/dL (ref 8.9–10.3)
Chloride: 100 mmol/L (ref 98–111)
Creatinine, Ser: 1.01 mg/dL — ABNORMAL HIGH (ref 0.44–1.00)
GFR, Estimated: >60 mL/min (ref >60)
Glucose, Bld: 145 mg/dL — ABNORMAL HIGH (ref 70–99)
Potassium: 4.2 mmol/L (ref 3.5–5.1)
Sodium: 136 mmol/L (ref 135–145)
Total Bilirubin: 0.8 mg/dL (ref 0.3–1.2)
Total Protein: 7.1 g/dL (ref 6.5–8.1)

## 2022-06-13 LAB — CBC WITH DIFFERENTIAL/PLATELET
Abs Immature Granulocytes: 0.03 10*3/uL (ref 0.00–0.07)
Basophils Absolute: 0 10*3/uL (ref 0.0–0.1)
Basophils Relative: 1 %
Eosinophils Absolute: 0 10*3/uL (ref 0.0–0.5)
Eosinophils Relative: 0 %
HCT: 24.9 % — ABNORMAL LOW (ref 36.0–46.0)
Hemoglobin: 8.4 g/dL — ABNORMAL LOW (ref 12.0–15.0)
Immature Granulocytes: 1 %
Lymphocytes Relative: 20 %
Lymphs Abs: 0.6 10*3/uL — ABNORMAL LOW (ref 0.7–4.0)
MCH: 30.5 pg (ref 26.0–34.0)
MCHC: 33.7 g/dL (ref 30.0–36.0)
MCV: 90.5 fL (ref 80.0–100.0)
Monocytes Absolute: 0.2 10*3/uL (ref 0.1–1.0)
Monocytes Relative: 5 %
Neutro Abs: 2.3 10*3/uL (ref 1.7–7.7)
Neutrophils Relative %: 73 %
Platelets: 146 10*3/uL — ABNORMAL LOW (ref 150–400)
RBC: 2.75 MIL/uL — ABNORMAL LOW (ref 3.87–5.11)
RDW: 19.6 % — ABNORMAL HIGH (ref 11.5–15.5)
WBC: 3.1 10*3/uL — ABNORMAL LOW (ref 4.0–10.5)
nRBC: 0 % (ref 0.0–0.2)

## 2022-06-13 LAB — MAGNESIUM: Magnesium: 1.7 mg/dL (ref 1.7–2.4)

## 2022-06-13 LAB — SAMPLE TO BLOOD BANK

## 2022-06-13 MED ORDER — POTASSIUM CHLORIDE IN NACL 20-0.9 MEQ/L-% IV SOLN
Freq: Once | INTRAVENOUS | Status: AC
  Filled 2022-06-13: qty 1000

## 2022-06-13 MED ORDER — MAGNESIUM SULFATE 2 GM/50ML IV SOLN
2.0000 g | Freq: Once | INTRAVENOUS | Status: AC
  Administered 2022-06-13: 2 g via INTRAVENOUS
  Filled 2022-06-13: qty 50

## 2022-06-13 MED ORDER — ONDANSETRON 8 MG PO TBDP: 8.0000 mg | ORAL_TABLET | Freq: Three times a day (TID) | ORAL | 3 refills | Status: DC | PRN

## 2022-06-13 MED ORDER — CETIRIZINE HCL 10 MG/ML IV SOLN
10.0000 mg | Freq: Once | INTRAVENOUS | Status: AC
  Administered 2022-06-13: 10 mg via INTRAVENOUS
  Filled 2022-06-13: qty 1

## 2022-06-13 MED ORDER — HEPARIN SOD (PORK) LOCK FLUSH 100 UNIT/ML IV SOLN
500.0000 [IU] | Freq: Once | INTRAVENOUS | Status: AC | PRN
  Administered 2022-06-13: 500 [IU]

## 2022-06-13 MED ORDER — ONDANSETRON HCL 4 MG/2ML IJ SOLN
8.0000 mg | Freq: Once | INTRAMUSCULAR | Status: AC
  Administered 2022-06-13: 8 mg via INTRAVENOUS
  Filled 2022-06-13: qty 4

## 2022-06-13 MED ORDER — SODIUM CHLORIDE 0.9 % IV SOLN: 8.0000 mg | Freq: Once | INTRAVENOUS | Status: DC

## 2022-06-13 MED ORDER — SODIUM CHLORIDE 0.9% FLUSH
10.0000 mL | INTRAVENOUS | Status: DC | PRN
  Administered 2022-06-13: 10 mL

## 2022-06-13 MED ORDER — SODIUM CHLORIDE 0.9% FLUSH
10.0000 mL | Freq: Once | INTRAVENOUS | Status: AC
  Administered 2022-06-13: 10 mL via INTRAVENOUS

## 2022-06-13 MED ORDER — SODIUM CHLORIDE 0.9 % IV SOLN: Freq: Once | INTRAVENOUS | Status: AC

## 2022-06-13 MED ORDER — FAMOTIDINE IN NACL 20-0.9 MG/50ML-% IV SOLN
20.0000 mg | Freq: Once | INTRAVENOUS | Status: AC
  Administered 2022-06-13: 20 mg via INTRAVENOUS
  Filled 2022-06-13: qty 50

## 2022-06-13 MED ORDER — SODIUM CHLORIDE 0.9 % IV SOLN
80.0000 mg/m2 | Freq: Once | INTRAVENOUS | Status: AC
  Administered 2022-06-13: 150 mg via INTRAVENOUS
  Filled 2022-06-13: qty 25

## 2022-06-13 NOTE — Patient Instructions (Signed)
[facility]  Discharge Instructions: Thank you for choosing [facility] to provide your oncology and hematology care.  If you have a lab appointment with the [facility], please come in thru the Main Entrance and check in at the main information desk.  Wear comfortable clothing and clothing appropriate for easy access to any Portacath or PICC line.   We strive to give you quality time with your provider. You may need to reschedule your appointment if you arrive late (15 or more minutes).  Arriving late affects you and other patients whose appointments are after yours.  Also, if you miss three or more appointments without notifying the office, you may be dismissed from the clinic at the provider's discretion.      For prescription refill requests, have your pharmacy contact our office and allow 72 hours for refills to be completed.    Today you received the following chemotherapy, paclitaxel and hydration fluids with potassium and magnesium   To help prevent nausea and vomiting after your treatment, we encourage you to take your nausea medication as directed.  BELOW ARE SYMPTOMS THAT SHOULD BE REPORTED IMMEDIATELY: *FEVER GREATER THAN 100.4 F (38 C) OR HIGHER *CHILLS OR SWEATING *NAUSEA AND VOMITING THAT IS NOT CONTROLLED WITH YOUR NAUSEA MEDICATION *UNUSUAL SHORTNESS OF BREATH *UNUSUAL BRUISING OR BLEEDING *URINARY PROBLEMS (pain or burning when urinating, or frequent urination) *BOWEL PROBLEMS (unusual diarrhea, constipation, pain near the anus) TENDERNESS IN MOUTH AND THROAT WITH OR WITHOUT PRESENCE OF ULCERS (sore throat, sores in mouth, or a toothache) UNUSUAL RASH, SWELLING OR PAIN  UNUSUAL VAGINAL DISCHARGE OR ITCHING   Items with * indicate a potential emergency and should be followed up as soon as possible or go to the Emergency Department if any problems should occur.  Please show the CHEMOTHERAPY ALERT CARD or IMMUNOTHERAPY ALERT CARD at  check-in to the Emergency Department and triage nurse.  Should you have questions after your visit or need to cancel or reschedule your appointment, please contact [facility] [phone]  and follow the prompts.  Office hours are 8:00 a.m. to 4:30 p.m. Monday - Friday. Please note that voicemails left after 4:00 p.m. may not be returned until the following business day.  We are closed weekends and major holidays. You have access to a nurse at all times for urgent questions. Please call the main number to the clinic [phone] and follow the prompts.  For any non-urgent questions, you may also contact your provider using MyChart. We now offer e-Visits for anyone 40 and older to request care online for non-urgent symptoms. For details visit [email or web address].   Also download the MyChart app! Go to the app store, search "MyChart", open the app, select [facility], and log in with your MyChart username and password.

## 2022-06-13 NOTE — Progress Notes (Signed)
Patient came in today, diarrhea times 6 since 3 am, has taken 5 imodium tabs, vomited once, takes zofran, requesting something iv for nausea today. does not take her compazine cause it makes her sleepy. last diarrhea was 0830. MD notified and labs reviewed. Ok to treat per MD.   Treatment given per orders. Patient tolerated it well without problems. Vitals stable and discharged home from clinic via wheelchair. Follow up as scheduled.

## 2022-06-14 NOTE — Telephone Encounter (Signed)
Left message informing patient her enrollment ended 05/06/22. I will mail an application to her home. Left call back number for questions: [phone]

## 2022-06-15 ENCOUNTER — Telehealth: Payer: Self-pay

## 2022-06-15 ENCOUNTER — Other Ambulatory Visit: Payer: Self-pay

## 2022-06-15 MED ORDER — GABAPENTIN 300 MG PO CAPS: 300.0000 mg | ORAL_CAPSULE | Freq: Three times a day (TID) | ORAL | 0 refills | Status: DC

## 2022-06-15 NOTE — Telephone Encounter (Signed)
Called patient again and made patient aware physician sent in prescription for gabapentin at local pharmacy. Patient understood.

## 2022-06-15 NOTE — Telephone Encounter (Signed)
Patient called complaining of leg and feet pain with numbness and tingling. Physician sent in gabapentin for the pain.   Tried calling patient with no answer and voice mail box full.

## 2022-06-18 ENCOUNTER — Inpatient Hospital Stay: Payer: No Typology Code available for payment source

## 2022-06-18 NOTE — Progress Notes (Signed)
FMLA papers emailed to the patients daughter with confirmation received by telephone call to Dustin Flock.

## 2022-06-19 ENCOUNTER — Telehealth: Payer: Self-pay | Admitting: Pharmacist

## 2022-06-19 ENCOUNTER — Inpatient Hospital Stay: Payer: No Typology Code available for payment source

## 2022-06-19 NOTE — Telephone Encounter (Signed)
Rescheduled appointment per 2/13 secure chat. Left voicemail.

## 2022-06-20 ENCOUNTER — Inpatient Hospital Stay: Payer: No Typology Code available for payment source | Admitting: Pharmacist

## 2022-06-20 ENCOUNTER — Inpatient Hospital Stay: Payer: No Typology Code available for payment source

## 2022-06-20 VITALS — BP 135/72 | HR 79 | Temp 98.2°F | Resp 16

## 2022-06-20 DIAGNOSIS — Z5111 Encounter for antineoplastic chemotherapy: Secondary | ICD-10-CM | POA: Diagnosis not present

## 2022-06-20 DIAGNOSIS — N6313 Unspecified lump in the right breast, lower outer quadrant: Secondary | ICD-10-CM

## 2022-06-20 DIAGNOSIS — C50811 Malignant neoplasm of overlapping sites of right female breast: Secondary | ICD-10-CM

## 2022-06-20 DIAGNOSIS — Z95828 Presence of other vascular implants and grafts: Secondary | ICD-10-CM

## 2022-06-20 DIAGNOSIS — D702 Other drug-induced agranulocytosis: Secondary | ICD-10-CM

## 2022-06-20 DIAGNOSIS — D649 Anemia, unspecified: Secondary | ICD-10-CM

## 2022-06-20 LAB — COMPREHENSIVE METABOLIC PANEL
ALT: 33 U/L (ref 0–44)
AST: 28 U/L (ref 15–41)
Albumin: 3.8 g/dL (ref 3.5–5.0)
Alkaline Phosphatase: 120 U/L (ref 38–126)
Anion gap: 10 (ref 5–15)
BUN: 22 mg/dL (ref 8–23)
CO2: 25 mmol/L (ref 22–32)
Calcium: 9.1 mg/dL (ref 8.9–10.3)
Chloride: 100 mmol/L (ref 98–111)
Creatinine, Ser: 1.09 mg/dL — ABNORMAL HIGH (ref 0.44–1.00)
GFR, Estimated: 58 mL/min — ABNORMAL LOW (ref >60)
Glucose, Bld: 114 mg/dL — ABNORMAL HIGH (ref 70–99)
Potassium: 4.4 mmol/L (ref 3.5–5.1)
Sodium: 135 mmol/L (ref 135–145)
Total Bilirubin: 0.8 mg/dL (ref 0.3–1.2)
Total Protein: 6.8 g/dL (ref 6.5–8.1)

## 2022-06-20 LAB — CBC WITH DIFFERENTIAL/PLATELET
Abs Immature Granulocytes: 0 10*3/uL (ref 0.00–0.07)
Band Neutrophils: 1 %
Basophils Absolute: 0 10*3/uL (ref 0.0–0.1)
Basophils Relative: 0 %
Eosinophils Absolute: 0 10*3/uL (ref 0.0–0.5)
Eosinophils Relative: 0 %
HCT: 18.9 % — ABNORMAL LOW (ref 36.0–46.0)
Hemoglobin: 6.4 g/dL — CL (ref 12.0–15.0)
Lymphocytes Relative: 65 %
Lymphs Abs: 1 10*3/uL (ref 0.7–4.0)
MCH: 31.1 pg (ref 26.0–34.0)
MCHC: 33.9 g/dL (ref 30.0–36.0)
MCV: 91.7 fL (ref 80.0–100.0)
Monocytes Absolute: 0.2 10*3/uL (ref 0.1–1.0)
Monocytes Relative: 14 %
Neutro Abs: 0.3 10*3/uL — CL (ref 1.7–7.7)
Neutrophils Relative %: 20 %
Platelets: 83 10*3/uL — ABNORMAL LOW (ref 150–400)
RBC: 2.06 MIL/uL — ABNORMAL LOW (ref 3.87–5.11)
RDW: 19.8 % — ABNORMAL HIGH (ref 11.5–15.5)
WBC Morphology: REACTIVE
WBC: 1.5 10*3/uL — ABNORMAL LOW (ref 4.0–10.5)
nRBC: 0 % (ref 0.0–0.2)

## 2022-06-20 LAB — PREPARE RBC (CROSSMATCH)

## 2022-06-20 LAB — MAGNESIUM: Magnesium: 2 mg/dL (ref 1.7–2.4)

## 2022-06-20 MED ORDER — ONDANSETRON HCL 4 MG/2ML IJ SOLN
8.0000 mg | Freq: Once | INTRAMUSCULAR | Status: AC
  Administered 2022-06-20: 8 mg via INTRAVENOUS
  Filled 2022-06-20: qty 4

## 2022-06-20 MED ORDER — ACETAMINOPHEN 325 MG PO TABS
650.0000 mg | ORAL_TABLET | Freq: Once | ORAL | Status: AC
  Administered 2022-06-20: 650 mg via ORAL
  Filled 2022-06-20: qty 2

## 2022-06-20 MED ORDER — DIPHENHYDRAMINE HCL 25 MG PO CAPS
25.0000 mg | ORAL_CAPSULE | Freq: Once | ORAL | Status: AC
  Administered 2022-06-20: 25 mg via ORAL
  Filled 2022-06-20: qty 1

## 2022-06-20 MED ORDER — HEPARIN SOD (PORK) LOCK FLUSH 100 UNIT/ML IV SOLN
500.0000 [IU] | Freq: Once | INTRAVENOUS | Status: AC
  Administered 2022-06-20: 500 [IU] via INTRAVENOUS

## 2022-06-20 MED ORDER — SODIUM CHLORIDE 0.9% FLUSH
10.0000 mL | INTRAVENOUS | Status: DC | PRN
  Administered 2022-06-20: 10 mL via INTRAVENOUS

## 2022-06-20 MED ORDER — FILGRASTIM-AYOW 480 MCG/0.8ML ~~LOC~~ SOSY
480.0000 ug | PREFILLED_SYRINGE | Freq: Once | SUBCUTANEOUS | Status: AC
  Administered 2022-06-20: 480 ug via SUBCUTANEOUS
  Filled 2022-06-20: qty 0.8

## 2022-06-20 MED ORDER — SODIUM CHLORIDE 0.9% FLUSH
10.0000 mL | INTRAVENOUS | Status: AC | PRN
  Administered 2022-06-20: 10 mL

## 2022-06-20 MED ORDER — SODIUM CHLORIDE 0.9 % IV SOLN: 8.0000 mg | Freq: Once | INTRAVENOUS | Status: DC

## 2022-06-20 MED ORDER — SODIUM CHLORIDE 0.9% IV SOLUTION
250.0000 mL | Freq: Once | INTRAVENOUS | Status: AC
  Administered 2022-06-20: 250 mL via INTRAVENOUS

## 2022-06-20 NOTE — Patient Instructions (Signed)
[facility]  Discharge Instructions: Thank you for choosing [facility] to provide your oncology and hematology care.  If you have a lab appointment with the [facility], please come in thru the Main Entrance and check in at the main information desk.  Wear comfortable clothing and clothing appropriate for easy access to any Portacath or PICC line.   We strive to give you quality time with your provider. You may need to reschedule your appointment if you arrive late (15 or more minutes).  Arriving late affects you and other patients whose appointments are after yours.  Also, if you miss three or more appointments without notifying the office, you may be dismissed from the clinic at the provider's discretion.      For prescription refill requests, have your pharmacy contact our office and allow 72 hours for refills to be completed.    Filgrastim Injection What is this medication? FILGRASTIM (fil GRA stim) lowers the risk of infection in people who are receiving chemotherapy. It works by Building control surveyor make more white blood cells, which protects your body from infection. It may also be used to help people who have been exposed to high doses of radiation. It can be used to help prepare your body before a stem cell transplant. It works by helping your bone marrow make and release stem cells into the blood. This medicine may be used for other purposes; ask your health care provider or pharmacist if you have questions. COMMON BRAND NAME(S): Neupogen, Nivestym, Releuko, Zarxio What should I tell my care team before I take this medication? They need to know if you have any of these conditions: History of blood diseases, such as sickle cell anemia Kidney disease Recent or ongoing radiation An unusual or allergic reaction to filgrastim, pegfilgrastim, latex, rubber, other medications, foods, dyes, or preservatives Pregnant or trying to get  pregnant Breast-feeding How should I use this medication? This medication is injected under the skin or into a vein. It is usually given by your care team in a hospital or clinic setting. It may be given at home. If you get this medication at home, you will be taught how to prepare and give it. Use exactly as directed. Take it as directed on the prescription label at the same time every day. Keep taking it unless your care team tells you to stop. It is important that you put your used needles and syringes in a special sharps container. Do not put them in a trash can. If you do not have a sharps container, call your pharmacist or care team to get one. This medication comes with INSTRUCTIONS FOR USE. Ask your pharmacist for directions on how to use this medication. Read the information carefully. Talk to your pharmacist or care team if you have questions. Talk to your care team about the use of this medication in children. While it may be prescribed for children for selected conditions, precautions do apply. Overdosage: If you think you have taken too much of this medicine contact a poison control center or emergency room at once. NOTE: This medicine is only for you. Do not share this medicine with others. What if I miss a dose? It is important not to miss any doses. Talk to your care team about what to do if you miss a dose. What may interact with this medication? Medications that may cause a release of neutrophils, such as lithium This list may not describe all possible interactions.  Give your health care provider a list of all the medicines, herbs, non-prescription drugs, or dietary supplements you use. Also tell them if you smoke, drink alcohol, or use illegal drugs. Some items may interact with your medicine. What should I watch for while using this medication? Your condition will be monitored carefully while you are receiving this medication. You may need bloodwork while taking this  medication. Talk to your care team about your risk of cancer. You may be more at risk for certain types of cancer if you take this medication. What side effects may I notice from receiving this medication? Side effects that you should report to your care team as soon as possible: Allergic reactions--skin rash, itching, hives, swelling of the face, lips, tongue, or throat Capillary leak syndrome--stomach or muscle pain, unusual weakness or fatigue, feeling faint or lightheaded, decrease in the amount of urine, swelling of the ankles, hands, or feet, trouble breathing High white blood cell level--fever, fatigue, trouble breathing, night sweats, change in vision, weight loss Inflammation of the aorta--fever, fatigue, back, chest, or stomach pain, severe headache Kidney injury (glomerulonephritis)--decrease in the amount of urine, red or dark [place] urine, foamy or bubbly urine, swelling of the ankles, hands, or feet Shortness of breath or trouble breathing Spleen injury--pain in upper left stomach or shoulder Unusual bruising or bleeding Side effects that usually do not require medical attention (report to your care team if they continue or are bothersome): Back pain Bone pain Fatigue Fever Headache Nausea This list may not describe all possible side effects. Call your doctor for medical advice about side effects. You may report side effects to FDA at 1-800-FDA-1088. Where should I keep my medication? Keep out of the reach of children and pets. Keep this medication in the original packaging until you are ready to take it. Protect from light. See product for storage information. Each product may have different instructions. Get rid of any unused medication after the expiration date. To get rid of medications that are no longer needed or have expired: Take the medication to a medications take-back program. Check with your pharmacy or law enforcement to find a location. If you cannot return the  medication, ask your pharmacist or care team how to get rid of this medication safely. NOTE: This sheet is a summary. It may not cover all possible information. If you have questions about this medicine, talk to your doctor, pharmacist, or health care provider.  2023 Elsevier/Gold Standard (2021-08-01 00:00:00)    To help prevent nausea and vomiting after your treatment, we encourage you to take your nausea medication as directed.  BELOW ARE SYMPTOMS THAT SHOULD BE REPORTED IMMEDIATELY: *FEVER GREATER THAN 100.4 F (38 C) OR HIGHER *CHILLS OR SWEATING *NAUSEA AND VOMITING THAT IS NOT CONTROLLED WITH YOUR NAUSEA MEDICATION *UNUSUAL SHORTNESS OF BREATH *UNUSUAL BRUISING OR BLEEDING *URINARY PROBLEMS (pain or burning when urinating, or frequent urination) *BOWEL PROBLEMS (unusual diarrhea, constipation, pain near the anus) TENDERNESS IN MOUTH AND THROAT WITH OR WITHOUT PRESENCE OF ULCERS (sore throat, sores in mouth, or a toothache) UNUSUAL RASH, SWELLING OR PAIN  UNUSUAL VAGINAL DISCHARGE OR ITCHING   Items with * indicate a potential emergency and should be followed up as soon as possible or go to the Emergency Department if any problems should occur.  Please show the CHEMOTHERAPY ALERT CARD or IMMUNOTHERAPY ALERT CARD at check-in to the Emergency Department and triage nurse.  Should you have questions after your visit or need to cancel or reschedule your  appointment, please contact [facility] [phone]  and follow the prompts.  Office hours are 8:00 a.m. to 4:30 p.m. Monday - Friday. Please note that voicemails left after 4:00 p.m. may not be returned until the following business day.  We are closed weekends and major holidays. You have access to a nurse at all times for urgent questions. Please call the main number to the clinic [phone] and follow the prompts.  For any non-urgent questions, you may also contact your provider using MyChart. We now offer  e-Visits for anyone 21 and older to request care online for non-urgent symptoms. For details visit [email or web address].   Also download the MyChart app! Go to the app store, search "MyChart", open the app, select [facility], and log in with your MyChart username and password.

## 2022-06-20 NOTE — Progress Notes (Signed)
CRITICAL VALUE ALERT Critical value received:  hgb 6.4 Date of notification:  06-20-22 Time of notification: [id number] Critical value read back:  Yes.   Nurse who received alert:  C. Herold Salguero RN MD notified time and response:  Dr. Debe Coder, [id number], will give one unit of blood prior to treatment and one unit of blood tomorrow per MD.     CRITICAL VALUE ALERT Critical value received:  ANC 0.3 Date of notification:  06-20-22 Time of notification: 1400 Critical value read back:  Yes.   Nurse who received alert:  C. Jerie Basford RN MD notified time and response:  [id number], Dr. Delton Coombes . Hold treatment , see progress note for further detail.

## 2022-06-20 NOTE — Progress Notes (Unsigned)
Patient presents today for Taxol infusion.  Patient is in satisfactory condition with only complaints of nausea and fatigue.  Vital signs are stable.  Labs reviewed.  Hemoglobin today is 6.4 and ANC is 0.3.  MD made aware.  We will hold treatment today.  We will give two units of PRBC and GCSF injection today per Dr. Delton Coombes. Patient will receive one unit of PRBC today and the other tomorrow.  We will proceed with transfusion per MD orders.   Patient tolerated Releuko injection with no complaints voiced.  Site clean and dry with no bruising or swelling noted.  No complaints of pain.    Patient tolerated transfusion well with no complaints voiced.  Patient left via wheelchair in stable condition.  Vital signs stable at discharge.  Follow up as scheduled.

## 2022-06-21 ENCOUNTER — Inpatient Hospital Stay: Payer: No Typology Code available for payment source

## 2022-06-21 ENCOUNTER — Encounter: Payer: Self-pay | Admitting: Hematology

## 2022-06-21 VITALS — BP 160/85 | HR 100 | Temp 98.6°F | Resp 18

## 2022-06-21 DIAGNOSIS — C50811 Malignant neoplasm of overlapping sites of right female breast: Secondary | ICD-10-CM

## 2022-06-21 DIAGNOSIS — Z171 Estrogen receptor negative status [ER-]: Secondary | ICD-10-CM

## 2022-06-21 DIAGNOSIS — Z95828 Presence of other vascular implants and grafts: Secondary | ICD-10-CM

## 2022-06-21 DIAGNOSIS — Z5111 Encounter for antineoplastic chemotherapy: Secondary | ICD-10-CM | POA: Diagnosis not present

## 2022-06-21 DIAGNOSIS — D649 Anemia, unspecified: Secondary | ICD-10-CM

## 2022-06-21 DIAGNOSIS — E860 Dehydration: Secondary | ICD-10-CM

## 2022-06-21 LAB — CBC WITH DIFFERENTIAL/PLATELET
Abs Immature Granulocytes: 0 10*3/uL (ref 0.00–0.07)
Band Neutrophils: 25 %
Basophils Absolute: 0 10*3/uL (ref 0.0–0.1)
Basophils Relative: 1 %
Eosinophils Absolute: 0 10*3/uL (ref 0.0–0.5)
Eosinophils Relative: 0 %
HCT: 21.9 % — ABNORMAL LOW (ref 36.0–46.0)
Hemoglobin: 7.4 g/dL — ABNORMAL LOW (ref 12.0–15.0)
Lymphocytes Relative: 18 %
Lymphs Abs: 0.7 10*3/uL (ref 0.7–4.0)
MCH: 30 pg (ref 26.0–34.0)
MCHC: 33.8 g/dL (ref 30.0–36.0)
MCV: 88.7 fL (ref 80.0–100.0)
Metamyelocytes Relative: 1 %
Monocytes Absolute: 0.3 10*3/uL (ref 0.1–1.0)
Monocytes Relative: 8 %
Neutro Abs: 3 10*3/uL (ref 1.7–7.7)
Neutrophils Relative %: 47 %
Platelets: 66 10*3/uL — ABNORMAL LOW (ref 150–400)
RBC: 2.47 MIL/uL — ABNORMAL LOW (ref 3.87–5.11)
RDW: 20.6 % — ABNORMAL HIGH (ref 11.5–15.5)
WBC Morphology: INCREASED
WBC: 4.1 10*3/uL (ref 4.0–10.5)
nRBC: 1 % — ABNORMAL HIGH (ref 0.0–0.2)
nRBC: 1 /100 WBC — ABNORMAL HIGH

## 2022-06-21 MED ORDER — FAMOTIDINE IN NACL 20-0.9 MG/50ML-% IV SOLN
20.0000 mg | Freq: Once | INTRAVENOUS | Status: AC
  Administered 2022-06-21: 20 mg via INTRAVENOUS
  Filled 2022-06-21: qty 50

## 2022-06-21 MED ORDER — SODIUM CHLORIDE 0.9 % IV SOLN: 8.0000 mg | Freq: Once | INTRAVENOUS | Status: DC

## 2022-06-21 MED ORDER — ACETAMINOPHEN 325 MG PO TABS
650.0000 mg | ORAL_TABLET | Freq: Once | ORAL | Status: AC
  Administered 2022-06-21: 650 mg via ORAL
  Filled 2022-06-21: qty 2

## 2022-06-21 MED ORDER — SODIUM CHLORIDE 0.9% FLUSH
10.0000 mL | INTRAVENOUS | Status: DC | PRN
  Administered 2022-06-21: 10 mL via INTRAVENOUS

## 2022-06-21 MED ORDER — SODIUM CHLORIDE 0.9% IV SOLUTION
250.0000 mL | Freq: Once | INTRAVENOUS | Status: AC
  Administered 2022-06-21: 250 mL via INTRAVENOUS

## 2022-06-21 MED ORDER — POTASSIUM CHLORIDE IN NACL 20-0.9 MEQ/L-% IV SOLN
Freq: Once | INTRAVENOUS | Status: AC
  Filled 2022-06-21: qty 1000

## 2022-06-21 MED ORDER — POTASSIUM CHLORIDE IN NACL 20-0.45 MEQ/L-% IV SOLN: Freq: Once | INTRAVENOUS | Status: DC

## 2022-06-21 MED ORDER — CETIRIZINE HCL 10 MG/ML IV SOLN: 10.0000 mg | Freq: Once | INTRAVENOUS | Status: DC

## 2022-06-21 MED ORDER — MAGNESIUM SULFATE 2 GM/50ML IV SOLN
2.0000 g | Freq: Once | INTRAVENOUS | Status: AC
  Administered 2022-06-21: 2 g via INTRAVENOUS
  Filled 2022-06-21: qty 50

## 2022-06-21 MED ORDER — HEPARIN SOD (PORK) LOCK FLUSH 100 UNIT/ML IV SOLN
500.0000 [IU] | Freq: Every day | INTRAVENOUS | Status: AC | PRN
  Administered 2022-06-21: 500 [IU]

## 2022-06-21 MED ORDER — SODIUM CHLORIDE 0.9 % IV SOLN: Freq: Once | INTRAVENOUS | Status: AC

## 2022-06-21 MED ORDER — SODIUM CHLORIDE 0.9% FLUSH
10.0000 mL | INTRAVENOUS | Status: AC | PRN
  Administered 2022-06-21: 10 mL

## 2022-06-21 MED ORDER — SODIUM CHLORIDE 0.9 % IV SOLN
80.0000 mg/m2 | Freq: Once | INTRAVENOUS | Status: AC
  Administered 2022-06-21: 150 mg via INTRAVENOUS
  Filled 2022-06-21: qty 25

## 2022-06-21 MED ORDER — DIPHENHYDRAMINE HCL 25 MG PO CAPS
25.0000 mg | ORAL_CAPSULE | Freq: Once | ORAL | Status: AC
  Administered 2022-06-21: 25 mg via ORAL
  Filled 2022-06-21: qty 1

## 2022-06-21 MED ORDER — ONDANSETRON HCL 4 MG/2ML IJ SOLN: 8.0000 mg | Freq: Once | INTRAMUSCULAR | Status: DC

## 2022-06-21 NOTE — Patient Instructions (Signed)
[facility]  Discharge Instructions: Thank you for choosing [facility] to provide your oncology and hematology care.  If you have a lab appointment with the [facility], please come in thru the Main Entrance and check in at the main information desk.  Wear comfortable clothing and clothing appropriate for easy access to any Portacath or PICC line.   We strive to give you quality time with your provider. You may need to reschedule your appointment if you arrive late (15 or more minutes).  Arriving late affects you and other patients whose appointments are after yours.  Also, if you miss three or more appointments without notifying the office, you may be dismissed from the clinic at the provider's discretion.      For prescription refill requests, have your pharmacy contact our office and allow 72 hours for refills to be completed.    Today you received the following chemotherapy and/or immunotherapy agents Taxol.  Paclitaxel Injection What is this medication? PACLITAXEL (PAK li TAX el) treats some types of cancer. It works by slowing down the growth of cancer cells. This medicine may be used for other purposes; ask your health care provider or pharmacist if you have questions. COMMON BRAND NAME(S): Onxol, Taxol What should I tell my care team before I take this medication? They need to know if you have any of these conditions: Heart disease Liver disease Low white blood cell levels An unusual or allergic reaction to paclitaxel, other medications, foods, dyes, or preservatives If you or your partner are pregnant or trying to get pregnant Breast-feeding How should I use this medication? This medication is injected into a vein. It is given by your care team in a hospital or clinic setting. Talk to your care team about the use of this medication in children. While it may be given to children for selected conditions, precautions do apply. Overdosage: If  you think you have taken too much of this medicine contact a poison control center or emergency room at once. NOTE: This medicine is only for you. Do not share this medicine with others. What if I miss a dose? Keep appointments for follow-up doses. It is important not to miss your dose. Call your care team if you are unable to keep an appointment. What may interact with this medication? Do not take this medication with any of the following: Live virus vaccines Other medications may affect the way this medication works. Talk with your care team about all of the medications you take. They may suggest changes to your treatment plan to lower the risk of side effects and to make sure your medications work as intended. This list may not describe all possible interactions. Give your health care provider a list of all the medicines, herbs, non-prescription drugs, or dietary supplements you use. Also tell them if you smoke, drink alcohol, or use illegal drugs. Some items may interact with your medicine. What should I watch for while using this medication? Your condition will be monitored carefully while you are receiving this medication. You may need blood work while taking this medication. This medication may make you feel generally unwell. This is not uncommon as chemotherapy can affect healthy cells as well as cancer cells. Report any side effects. Continue your course of treatment even though you feel ill unless your care team tells you to stop. This medication can cause serious allergic reactions. To reduce the risk, your care team may give you other medications to  take before receiving this one. Be sure to follow the directions from your care team. This medication may increase your risk of getting an infection. Call your care team for advice if you get a fever, chills, sore throat, or other symptoms of a cold or flu. Do not treat yourself. Try to avoid being around people who are sick. This medication may  increase your risk to bruise or bleed. Call your care team if you notice any unusual bleeding. Be careful brushing or flossing your teeth or using a toothpick because you may get an infection or bleed more easily. If you have any dental work done, tell your dentist you are receiving this medication. Talk to your care team if you may be pregnant. Serious birth defects can occur if you take this medication during pregnancy. Talk to your care team before breastfeeding. Changes to your treatment plan may be needed. What side effects may I notice from receiving this medication? Side effects that you should report to your care team as soon as possible: Allergic reactions--skin rash, itching, hives, swelling of the face, lips, tongue, or throat Heart rhythm changes--fast or irregular heartbeat, dizziness, feeling faint or lightheaded, chest pain, trouble breathing Increase in blood pressure Infection--fever, chills, cough, sore throat, wounds that don't heal, pain or trouble when passing urine, general feeling of discomfort or being unwell Low blood pressure--dizziness, feeling faint or lightheaded, blurry vision Low red blood cell level--unusual weakness or fatigue, dizziness, headache, trouble breathing Painful swelling, warmth, or redness of the skin, blisters or sores at the infusion site Pain, tingling, or numbness in the hands or feet Slow heartbeat--dizziness, feeling faint or lightheaded, confusion, trouble breathing, unusual weakness or fatigue Unusual bruising or bleeding Side effects that usually do not require medical attention (report to your care team if they continue or are bothersome): Diarrhea Hair loss Joint pain Loss of appetite Muscle pain Nausea Vomiting This list may not describe all possible side effects. Call your doctor for medical advice about side effects. You may report side effects to FDA at 1-800-FDA-1088. Where should I keep my medication? This medication is given in  a hospital or clinic. It will not be stored at home. NOTE: This sheet is a summary. It may not cover all possible information. If you have questions about this medicine, talk to your doctor, pharmacist, or health care provider.  2023 Elsevier/Gold Standard (2021-08-23 00:00:00)        To help prevent nausea and vomiting after your treatment, we encourage you to take your nausea medication as directed.  BELOW ARE SYMPTOMS THAT SHOULD BE REPORTED IMMEDIATELY: *FEVER GREATER THAN 100.4 F (38 C) OR HIGHER *CHILLS OR SWEATING *NAUSEA AND VOMITING THAT IS NOT CONTROLLED WITH YOUR NAUSEA MEDICATION *UNUSUAL SHORTNESS OF BREATH *UNUSUAL BRUISING OR BLEEDING *URINARY PROBLEMS (pain or burning when urinating, or frequent urination) *BOWEL PROBLEMS (unusual diarrhea, constipation, pain near the anus) TENDERNESS IN MOUTH AND THROAT WITH OR WITHOUT PRESENCE OF ULCERS (sore throat, sores in mouth, or a toothache) UNUSUAL RASH, SWELLING OR PAIN  UNUSUAL VAGINAL DISCHARGE OR ITCHING   Items with * indicate a potential emergency and should be followed up as soon as possible or go to the Emergency Department if any problems should occur.  Please show the CHEMOTHERAPY ALERT CARD or IMMUNOTHERAPY ALERT CARD at check-in to the Emergency Department and triage nurse.  Should you have questions after your visit or need to cancel or reschedule your appointment, please contact [facility] [phone]  and follow the prompts.  Office hours are 8:00 a.m. to 4:30 p.m. Monday - Friday. Please note that voicemails left after 4:00 p.m. may not be returned until the following business day.  We are closed weekends and major holidays. You have access to a nurse at all times for urgent questions. Please call the main number to the clinic [phone] and follow the prompts.  For any non-urgent questions, you may also contact your provider using MyChart. We now offer e-Visits for anyone 21 and older to  request care online for non-urgent symptoms. For details visit [email or web address].   Also download the MyChart app! Go to the app store, search "MyChart", open the app, select [facility], and log in with your MyChart username and password.

## 2022-06-21 NOTE — Progress Notes (Signed)
Discontinue Releuko 480 mcg from treatment plan, add Udenyca to 06/22/22.  T.O Dr Rhys Martini, PharmD

## 2022-06-21 NOTE — Progress Notes (Signed)
Patient presents today for one unit of PRBC and chemotherapy infusion.  Patient is complaining of feeling dehydrated today.  MD made aware.  Vital signs are stable.  Labs reviewed.  ANC today is 3.0.  Platelets today are 66.  Dr. Delton Coombes made aware.  We will proceed with treatment per MD orders. We will also give IVF 1 L over 2 hours per Dr. Delton Coombes.    Patient tolerated transfusion well with no complaints voiced.  Patient's temp increased to 99.6 during transfusion.  MD made aware.   There was conflict if this was due to heated blankets vs. transfusion reaction.  Temperature was closely monitored and returned to normal.  Patient did not experience any other distress and other vitals remained stable.    Patient tolerated treatment well with no complaints voiced.  Patient left via wheelchair with daughter in stable condition.  Vital signs stable at discharge.  Follow up as scheduled.

## 2022-06-21 NOTE — Progress Notes (Signed)
Patients port flushed without difficulty.  Good blood return noted with no bruising or swelling noted at site.  Patient remains accessed for chemotherapy treatment and blood transfusion.

## 2022-06-21 NOTE — Progress Notes (Signed)
ANC is 2.8, platelets are 66, all labs reviwed with MD today. Ok to proceed with treatment today per Dr. Delton Coombes.

## 2022-06-22 ENCOUNTER — Inpatient Hospital Stay: Payer: No Typology Code available for payment source

## 2022-06-22 ENCOUNTER — Telehealth: Payer: Self-pay | Admitting: Pharmacist

## 2022-06-22 VITALS — BP 125/72 | HR 100 | Temp 97.3°F | Resp 19

## 2022-06-22 DIAGNOSIS — C50811 Malignant neoplasm of overlapping sites of right female breast: Secondary | ICD-10-CM

## 2022-06-22 DIAGNOSIS — Z5111 Encounter for antineoplastic chemotherapy: Secondary | ICD-10-CM | POA: Diagnosis not present

## 2022-06-22 LAB — BPAM RBC
Blood Product Expiration Date: 202402272359
Blood Product Expiration Date: 202403012359
ISSUE DATE / TIME: 202402141439
ISSUE DATE / TIME: 202402151103
Unit Type and Rh: 600
Unit Type and Rh: 600

## 2022-06-22 LAB — TYPE AND SCREEN
ABO/RH(D): A NEG
Antibody Screen: NEGATIVE
Unit division: 0
Unit division: 0

## 2022-06-22 MED ORDER — PEGFILGRASTIM-CBQV 6 MG/0.6ML ~~LOC~~ SOSY
6.0000 mg | PREFILLED_SYRINGE | Freq: Once | SUBCUTANEOUS | Status: AC
  Administered 2022-06-22: 6 mg via SUBCUTANEOUS
  Filled 2022-06-22: qty 0.6

## 2022-06-22 NOTE — Patient Instructions (Signed)
[facility]  Discharge Instructions: Thank you for choosing [facility] to provide your oncology and hematology care.  If you have a lab appointment with the [facility], please come in thru the Main Entrance and check in at the main information desk.  Wear comfortable clothing and clothing appropriate for easy access to any Portacath or PICC line.   We strive to give you quality time with your provider. You may need to reschedule your appointment if you arrive late (15 or more minutes).  Arriving late affects you and other patients whose appointments are after yours.  Also, if you miss three or more appointments without notifying the office, you may be dismissed from the clinic at the provider's discretion.      For prescription refill requests, have your pharmacy contact our office and allow 72 hours for refills to be completed.    Today you received the following, udenyca injection    To help prevent nausea and vomiting after your treatment, we encourage you to take your nausea medication as directed.  BELOW ARE SYMPTOMS THAT SHOULD BE REPORTED IMMEDIATELY: *FEVER GREATER THAN 100.4 F (38 C) OR HIGHER *CHILLS OR SWEATING *NAUSEA AND VOMITING THAT IS NOT CONTROLLED WITH YOUR NAUSEA MEDICATION *UNUSUAL SHORTNESS OF BREATH *UNUSUAL BRUISING OR BLEEDING *URINARY PROBLEMS (pain or burning when urinating, or frequent urination) *BOWEL PROBLEMS (unusual diarrhea, constipation, pain near the anus) TENDERNESS IN MOUTH AND THROAT WITH OR WITHOUT PRESENCE OF ULCERS (sore throat, sores in mouth, or a toothache) UNUSUAL RASH, SWELLING OR PAIN  UNUSUAL VAGINAL DISCHARGE OR ITCHING   Items with * indicate a potential emergency and should be followed up as soon as possible or go to the Emergency Department if any problems should occur.  Please show the CHEMOTHERAPY ALERT CARD or IMMUNOTHERAPY ALERT CARD at check-in to the Emergency Department and triage  nurse.  Should you have questions after your visit or need to cancel or reschedule your appointment, please contact [facility] [phone]  and follow the prompts.  Office hours are 8:00 a.m. to 4:30 p.m. Monday - Friday. Please note that voicemails left after 4:00 p.m. may not be returned until the following business day.  We are closed weekends and major holidays. You have access to a nurse at all times for urgent questions. Please call the main number to the clinic [phone] and follow the prompts.  For any non-urgent questions, you may also contact your provider using MyChart. We now offer e-Visits for anyone 98 and older to request care online for non-urgent symptoms. For details visit [email or web address].   Also download the MyChart app! Go to the app store, search "MyChart", open the app, select [facility], and log in with your MyChart username and password.

## 2022-06-22 NOTE — Progress Notes (Signed)
Udenyca injection given per orders. Patient tolerated it well without problems. Vitals stable and discharged home from clinic via wheelchair. Follow up as scheduled.

## 2022-06-22 NOTE — Telephone Encounter (Signed)
Called patient to make sure that Monday's in-person visit with Jenny Reichmann I. works for the patient. The phone rang, was picked up, then hung up. Unable to get in touch with the patient.

## 2022-06-25 ENCOUNTER — Encounter: Payer: Self-pay | Admitting: *Deleted

## 2022-06-25 ENCOUNTER — Inpatient Hospital Stay: Payer: No Typology Code available for payment source | Admitting: Pharmacist

## 2022-06-26 ENCOUNTER — Telehealth: Payer: Self-pay | Admitting: Pharmacist

## 2022-06-26 NOTE — Telephone Encounter (Signed)
Per 2/20 pt request, pt has been r/s and confirmed new appt

## 2022-06-28 ENCOUNTER — Other Ambulatory Visit: Payer: Self-pay

## 2022-06-28 DIAGNOSIS — Z171 Estrogen receptor negative status [ER-]: Secondary | ICD-10-CM

## 2022-07-02 ENCOUNTER — Other Ambulatory Visit: Payer: Self-pay | Admitting: Oncology

## 2022-07-03 ENCOUNTER — Other Ambulatory Visit: Payer: Self-pay | Admitting: *Deleted

## 2022-07-03 ENCOUNTER — Telehealth: Payer: Self-pay | Admitting: *Deleted

## 2022-07-03 ENCOUNTER — Inpatient Hospital Stay: Payer: No Typology Code available for payment source | Admitting: Pharmacist

## 2022-07-03 DIAGNOSIS — Z79899 Other long term (current) drug therapy: Secondary | ICD-10-CM

## 2022-07-03 DIAGNOSIS — C50311 Malignant neoplasm of lower-inner quadrant of right female breast: Secondary | ICD-10-CM | POA: Diagnosis not present

## 2022-07-03 DIAGNOSIS — Z171 Estrogen receptor negative status [ER-]: Secondary | ICD-10-CM

## 2022-07-03 NOTE — Telephone Encounter (Signed)
Patient states that she tested positive for COVID Monday 2/19, with symptoms starting week prior.  Still has cough and does not feel well in general.  No prescriptions given per PCP.  Is for treatment tomorrow, however sees Dr. Delton Coombes as well and will determine if she is well enough for treatment.  If she feels worse in am will call to reschedule.

## 2022-07-03 NOTE — Progress Notes (Signed)
[facility]       Telephone: [phone]?Fax: [phone]   Oncology Clinical Pharmacist Practitioner Progress Note  Marilyn Ford is a 62 y.o. female with a diagnosis of breast cancer currently on KN522 under the care of Dr. Delton Coombes.  I connected with Marilyn Ford today by telephone and verified that I was speaking with the correct person using two patient identifiers.   Other persons participating in the visit and their role in the encounter: none   Patient's location: home  Provider's location: clinic  Indication/Regimen Pembrolizumab Beryle Flock), paclitaxel (Taxol), carboplatin (Paraplatin ) followed by pembrolizumab Beryle Flock), doxorubicin (Adriamycin), cyclophosphamide (Cytoxan) is being used appropriately for treatment of breast cancer by Dr. Wayne Sever Readings from Last 1 Encounters:  06/20/22 149 lb 9.6 oz (67.9 kg)    Estimated body surface area is 1.74 meters squared as calculated from the following:   Height as of 06/11/22: '5\' 3"'$  (1.6 m).   Weight as of 06/20/22: 149 lb 9.6 oz (67.9 kg).  The dosing regimen cycle is every 21 days x 4 cycles = completed  Pembrolizumab (200 mg) on Day 1 Paclitaxel (80 mg/m2) on Days 1, 8, 15 Carboplatin (AUC 5) on Day 1 Filgrastim (300 mcg or 480 mcg based on weight)  on Days 16, 17, 18  Followed by the below regimen cycle every 21 days x 4 cycles  Pembrolizumab (200 mg) Day 1 Doxorubicin (60 mg/m2) Day 1 Cyclophosphamide (600 mg/m2) Day 1 Pegfilgrastim (6 mg) on Day 3  It is planned to continue until treatment plan completion or unacceptable toxicity.  Clinical pharmacy last met with Marilyn Ford via telephone on 03/28/22 to review the keynote 522 regimen.  At that time, she had requested a follow-up visit with clinical pharmacy to discuss the second part of that regimen (Pembrolizumab/AC q21d) which we did today.  We again reviewed potential side effects of the regimen which are detailed  below.  We also reviewed that she will be receiving pegfilgrastim on day 3 due to the potential of this part of the regimen causing severe myelosuppression.  We did review that she could take over-the-counter loratadine to prevent pegfilgrastim associated bone pain.  She verbalized understanding of the plan.  During our discussion, she stated that she continues to have loose stool and currently is only able to take in chicken noodle soup and different fluids.  We did discuss that she could try sugar-free Gatorade or sugar-free Powerade.  She does report having Pedialyte at home as well and we said this would also be a good option if she continues to have loose stools.  Overall, her weight appears stable.  She did state that she has discussed this with Dr. Delton Coombes and we explained to her that we will also send our visit summary from today so that he is aware of the side effects.  She also reports that since she has been on the toilet so much she has developed a small sore on her buttocks which she stated she would report to Dr. Delton Coombes when she next sees him.  We will also put this in her follow-up for her visit today.  She stated that she tested negative for COVID today and that she is afebrile.  We discussed that the schedulers will need to contact her regarding her upcoming appointments and that she will likely need an echo which has been ordered but not scheduled yet prior to starting doxorubicin.  Marilyn Ford also  is reporting some weakness on her right leg.  We reviewed that we would discuss all of this in her follow-up visit summary to Dr. Delton Coombes.   Dose Modifications No dose reductions for doxorubicin and cyclophosphamide noted at this time  Allergies Allergies  Allergen Reactions   Penicillins Shortness Of Breath   Sulfa Antibiotics Rash    Vitals = no vitals or labs done for this telephone visit Contraindications Contraindications were reviewed? Yes Contraindications to therapy  were identified? No   Safety Precautions The following safety precautions were reviewed:  Fever: reviewed the importance of having a thermometer and the Centers for Disease Control and Prevention (CDC) definition of fever which is 100.91F (38C) or higher. Patient should call 24/7 triage at [phone] if experiencing a fever or any other symptoms Decreased white blood cells (WBCs) and increased risk for infection Decreased platelet count and increased risk of bleeding Decreased hemoglobin, part of the red blood cells that carry iron and oxygen Nausea or vomiting Diarrhea or constipation Hair Loss Fatigue Changes in liver function Rash Peripheral Neuropathy Hypersensitivity reactions Pembrolizumab toxicities (skin, lung, liver, thyroid, kidneys, vision Changes in color of urine Mouth sores MDS/AML Handling body fluids and waste  Medication Reconciliation Current Outpatient Medications  Medication Sig Dispense Refill   Alcohol Swabs (B-D SINGLE USE SWABS REGULAR) PADS Test BS daily and as needed Dx E11.9 100 each 3   amLODipine (NORVASC) 10 MG tablet 1 tablet daily 90 tablet 3   Apple Cider Vinegar 500 MG TABS Take 500 mg by mouth in the morning.     aspirin 81 MG EC tablet Take 1 tablet (81 mg total) by mouth daily. 30 tablet 0   atorvastatin (LIPITOR) 80 MG tablet TAKE 1 TABLET EVERY DAY AT 6PM 90 tablet 3   Blood Glucose Calibration (TRUE METRIX LEVEL 1) Low SOLN Use with glucometer Dx E11.9 3 each 0   Blood Glucose Monitoring Suppl (TRUE METRIX AIR GLUCOSE METER) w/Device KIT Test BS daily and as needed Dx E11.9 1 kit 0   Calcium Carb-Cholecalciferol (CALCIUM-VITAMIN D) 600-400 MG-UNIT TABS Take 1 tablet by mouth in the morning.     clopidogrel (PLAVIX) 75 MG tablet Take 75 mg by mouth daily.     Coenzyme Q10 (COQ10) 100 MG CAPS Take 100 mg by mouth in the morning.     Cranberry 425 MG CAPS Take 425 mg by mouth in the morning and at bedtime.     Dulaglutide (TRULICITY) 4.5  0000000 SOPN Inject 4.5 mg as directed once a week. 6 mL 1   Flaxseed, Linseed, (FLAXSEED OIL) 1000 MG CAPS Take 1,000 mg by mouth in the morning.     gabapentin (NEURONTIN) 300 MG capsule Take 1 capsule (300 mg total) by mouth 3 (three) times daily. 90 capsule 0   glucose blood (TRUE METRIX BLOOD GLUCOSE TEST) test strip Test BS daily and as needed Dx E11.9 100 each 3   icosapent Ethyl (VASCEPA) 1 g capsule Take 2 g by mouth 2 (two) times daily.     Krill Oil 500 MG CAPS Take 3 capsules (1,500 mg total) by mouth in the morning and at bedtime.     lidocaine-prilocaine (EMLA) cream Apply to affected area once 30 g 3   LORazepam (ATIVAN) 1 MG tablet Take 1 mg by mouth every 8 (eight) hours as needed.     magnesium oxide (MAG-OX) 400 (240 Mg) MG tablet TAKE 1 TABLET BY MOUTH TWICE A DAY 60 tablet 1  meloxicam (MOBIC) 15 MG tablet Take 15 mg by mouth daily.     metoprolol (TOPROL-XL) 200 MG 24 hr tablet TAKE 1 TABLET ONE TIME DAILY, WITH OR IMMEDIATELY FOLLOWING A MEAL 90 tablet 3   Multiple Vitamin (MULTIVITAMIN) capsule Take 1 capsule by mouth in the morning.     ondansetron (ZOFRAN) 8 MG tablet Take 1 tablet (8 mg total) by mouth every 8 (eight) hours as needed for nausea or vomiting. Start on the third day after chemotherapy. 30 tablet 1   ondansetron (ZOFRAN-ODT) 8 MG disintegrating tablet Take 1 tablet (8 mg total) by mouth every 8 (eight) hours as needed for nausea or vomiting. 30 tablet 3   pantoprazole (PROTONIX) 40 MG tablet TAKE 1 TABLET twice daily FOR STOMACH 180 tablet 3   prochlorperazine (COMPAZINE) 10 MG tablet Take 1 tablet (10 mg total) by mouth every 6 (six) hours as needed for nausea or vomiting. 30 tablet 1   traMADol (ULTRAM) 50 MG tablet Take 1 tablet (50 mg total) by mouth every 6 (six) hours as needed for moderate pain or severe pain. 20 tablet 0   traZODone (DESYREL) 150 MG tablet TAKE 1 OR 2 TABLETS AT BEDTIME FOR SLEEP 180 tablet 3   TRUEplus Lancets 33G MISC Test BS  daily and as needed Dx E11.9 100 each 3   valsartan (DIOVAN) 320 MG tablet Take 1 tablet (320 mg total) by mouth daily. For blood pressure. 90 tablet 1   zinc gluconate 50 MG tablet Take 50 mg by mouth daily.     No current facility-administered medications for this visit.    Medication reconciliation is based on the patient's most recent medication list in the electronic medical record (EMR) including herbal products and OTC medications.   The patient's medication list was reviewed today with the patient? Yes   Drug-drug interactions (DDIs) DDIs were evaluated? Yes Significant DDIs identified? No   Drug-Food Interactions Drug-food interactions were evaluated? Yes Drug-food interactions identified? No   Follow-up Plan  Treatment start date: Part 2 of treatment regimen TBD Port placement date: 03/20/22 ECHO date: ordered but not scheduled yet. Likely needs to be done prior to starting doxorubicin. Patient continues to experience loose stools which she reports are refractory to loperamide. She has made Dr. Delton Coombes aware. She will continue to eat chicken noodle soup and start using Pedialyte or other sugar free alternatives such as Gatorade or Powerade. Discussed importance of remaining hydrated Reviewed differences of pembrolizumab/AC compared to part 1 of the regimen she just finished Reporting a small sore on her buttocks area. She said she would notify Dr. Delton Coombes when she seems him next. Will also forward visit note from today to him and copy Dr. Burr Medico. Also reporting some weakness in her right leg Scheduling from [facility] will likely need to contact patient and treatment plan dates should be adjusted once new time has been agreed upon. She tested negative for COVID-19 today and afebrile but likely safety measures should remain in place per [facility] guidelines. Clinical pharmacy will assist Dr. Delton Coombes and Marilyn Ford on an as needed basis going forward  Marilyn Ford participated in the discussion, expressed understanding, and voiced agreement with the above plan. All questions were answered to her satisfaction. The patient was advised to contact the clinic at [phone] with any questions or concerns prior to her return visit.   I spent 30 minutes assessing the patient.  Raina Mina, RPH-CPP, 07/03/2022 10:26 AM  **  Disclaimer: This note was dictated with voice recognition software. Similar sounding words can inadvertently be transcribed and this note may contain transcription errors which may not have been corrected upon publication of note.**

## 2022-07-04 ENCOUNTER — Inpatient Hospital Stay: Payer: No Typology Code available for payment source

## 2022-07-04 ENCOUNTER — Inpatient Hospital Stay (HOSPITAL_BASED_OUTPATIENT_CLINIC_OR_DEPARTMENT_OTHER): Payer: No Typology Code available for payment source | Admitting: Hematology

## 2022-07-04 VITALS — BP 124/70 | HR 86 | Temp 98.9°F | Resp 18 | Wt 143.0 lb

## 2022-07-04 DIAGNOSIS — Z5111 Encounter for antineoplastic chemotherapy: Secondary | ICD-10-CM | POA: Diagnosis not present

## 2022-07-04 DIAGNOSIS — R197 Diarrhea, unspecified: Secondary | ICD-10-CM

## 2022-07-04 DIAGNOSIS — D702 Other drug-induced agranulocytosis: Secondary | ICD-10-CM

## 2022-07-04 DIAGNOSIS — N6313 Unspecified lump in the right breast, lower outer quadrant: Secondary | ICD-10-CM

## 2022-07-04 DIAGNOSIS — Z95828 Presence of other vascular implants and grafts: Secondary | ICD-10-CM

## 2022-07-04 DIAGNOSIS — C50811 Malignant neoplasm of overlapping sites of right female breast: Secondary | ICD-10-CM

## 2022-07-04 LAB — CBC WITH DIFFERENTIAL/PLATELET
Abs Immature Granulocytes: 0.1 10*3/uL — ABNORMAL HIGH (ref 0.00–0.07)
Band Neutrophils: 4 %
Basophils Absolute: 0.1 10*3/uL (ref 0.0–0.1)
Basophils Relative: 1 %
Eosinophils Absolute: 0 10*3/uL (ref 0.0–0.5)
Eosinophils Relative: 0 %
HCT: 25.9 % — ABNORMAL LOW (ref 36.0–46.0)
Hemoglobin: 8.4 g/dL — ABNORMAL LOW (ref 12.0–15.0)
Lymphocytes Relative: 22 %
Lymphs Abs: 1.5 10*3/uL (ref 0.7–4.0)
MCH: 30.8 pg (ref 26.0–34.0)
MCHC: 32.4 g/dL (ref 30.0–36.0)
MCV: 94.9 fL (ref 80.0–100.0)
Monocytes Absolute: 0.1 10*3/uL (ref 0.1–1.0)
Monocytes Relative: 2 %
Myelocytes: 2 %
Neutro Abs: 5 10*3/uL (ref 1.7–7.7)
Neutrophils Relative %: 69 %
Platelets: 149 10*3/uL — ABNORMAL LOW (ref 150–400)
RBC: 2.73 MIL/uL — ABNORMAL LOW (ref 3.87–5.11)
RDW: 23 % — ABNORMAL HIGH (ref 11.5–15.5)
WBC: 6.8 10*3/uL (ref 4.0–10.5)
nRBC: 0.3 % — ABNORMAL HIGH (ref 0.0–0.2)

## 2022-07-04 LAB — COMPREHENSIVE METABOLIC PANEL
ALT: 31 U/L (ref 0–44)
AST: 24 U/L (ref 15–41)
Albumin: 3.3 g/dL — ABNORMAL LOW (ref 3.5–5.0)
Alkaline Phosphatase: 151 U/L — ABNORMAL HIGH (ref 38–126)
Anion gap: 8 (ref 5–15)
BUN: 14 mg/dL (ref 8–23)
CO2: 27 mmol/L (ref 22–32)
Calcium: 8.4 mg/dL — ABNORMAL LOW (ref 8.9–10.3)
Chloride: 100 mmol/L (ref 98–111)
Creatinine, Ser: 1.05 mg/dL — ABNORMAL HIGH (ref 0.44–1.00)
GFR, Estimated: >60 mL/min (ref >60)
Glucose, Bld: 125 mg/dL — ABNORMAL HIGH (ref 70–99)
Potassium: 4.1 mmol/L (ref 3.5–5.1)
Sodium: 135 mmol/L (ref 135–145)
Total Bilirubin: 1.1 mg/dL (ref 0.3–1.2)
Total Protein: 6.4 g/dL — ABNORMAL LOW (ref 6.5–8.1)

## 2022-07-04 LAB — MAGNESIUM: Magnesium: 2 mg/dL (ref 1.7–2.4)

## 2022-07-04 LAB — TSH: TSH: 1.45 u[IU]/mL (ref 0.350–4.500)

## 2022-07-04 MED ORDER — PREDNISONE 50 MG PO TABS: 50.0000 mg | ORAL_TABLET | Freq: Every day | ORAL | 0 refills | Status: DC

## 2022-07-04 MED ORDER — POTASSIUM CHLORIDE IN NACL 20-0.9 MEQ/L-% IV SOLN
Freq: Once | INTRAVENOUS | Status: AC
  Filled 2022-07-04: qty 1000

## 2022-07-04 MED ORDER — SODIUM CHLORIDE 0.9% FLUSH
10.0000 mL | Freq: Once | INTRAVENOUS | Status: AC | PRN
  Administered 2022-07-04: 10 mL

## 2022-07-04 MED ORDER — MAGNESIUM SULFATE 2 GM/50ML IV SOLN
2.0000 g | Freq: Once | INTRAVENOUS | Status: AC
  Administered 2022-07-04: 2 g via INTRAVENOUS
  Filled 2022-07-04: qty 50

## 2022-07-04 MED ORDER — HEPARIN SOD (PORK) LOCK FLUSH 100 UNIT/ML IV SOLN
500.0000 [IU] | Freq: Once | INTRAVENOUS | Status: AC | PRN
  Administered 2022-07-04: 500 [IU]

## 2022-07-04 MED ORDER — SODIUM CHLORIDE 0.9% FLUSH
10.0000 mL | INTRAVENOUS | Status: DC | PRN
  Administered 2022-07-04: 10 mL via INTRAVENOUS

## 2022-07-04 NOTE — Progress Notes (Signed)
Patients port flushed without difficulty.  Good blood return noted with no bruising or swelling noted at site.  Patient remains accessed for chemotherapy treatment.  

## 2022-07-04 NOTE — Patient Instructions (Addendum)
[facility] at [facility] Discharge Instructions   You were seen and examined today by Dr. Delton Coombes.  Your lab work from today is pending. Your blood counts are back and are normal/stable. Your white blood cell count is normal.   You may take Mucinex and any other over the counter decongestants to help alleviate your symptoms.    Thank you for choosing [facility] at [facility] to provide your oncology and hematology care.  To afford each patient quality time with our provider, please arrive at least 15 minutes before your scheduled appointment time.   If you have a lab appointment with the [facility] please come in thru the Main Entrance and check in at the main information desk.  You need to re-schedule your appointment should you arrive 10 or more minutes late.  We strive to give you quality time with our providers, and arriving late affects you and other patients whose appointments are after yours.  Also, if you no show three or more times for appointments you may be dismissed from the clinic at the providers discretion.     Again, thank you for choosing [facility].  Our hope is that these requests will decrease the amount of time that you wait before being seen by our physicians.       _____________________________________________________________  Should you have questions after your visit to [facility], please contact our office at [phone] and follow the prompts.  Our office hours are 8:00 a.m. and 4:30 p.m. Monday - Friday.  Please note that voicemails left after 4:00 p.m. may not be returned until the following business day.  We are closed weekends and major holidays.  You do have access to a nurse 24-7, just call the main number to the clinic [phone] and do not press any options, hold on the line and a nurse will answer the phone.    For prescription refill requests, have your pharmacy contact our  office and allow 72 hours.    Due to Covid, you will need to wear a mask upon entering the hospital. If you do not have a mask, a mask will be given to you at the Main Entrance upon arrival. For doctor visits, patients may have 1 support person age 26 or older with them. For treatment visits, patients can not have anyone with them due to social distancing guidelines and our immunocompromised population.

## 2022-07-04 NOTE — Patient Instructions (Signed)
[facility]  Discharge Instructions: Thank you for choosing [facility] to provide your oncology and hematology care.  If you have a lab appointment with the Cancer Center, please come in thru the Main Entrance and check in at the main information desk.  Wear comfortable clothing and clothing appropriate for easy access to any Portacath or PICC line.   We strive to give you quality time with your provider. You may need to reschedule your appointment if you arrive late (15 or more minutes).  Arriving late affects you and other patients whose appointments are after yours.  Also, if you miss three or more appointments without notifying the office, you may be dismissed from the clinic at the provider's discretion.      For prescription refill requests, have your pharmacy contact our office and allow 72 hours for refills to be completed.     To help prevent nausea and vomiting after your treatment, we encourage you to take your nausea medication as directed.  BELOW ARE SYMPTOMS THAT SHOULD BE REPORTED IMMEDIATELY: *FEVER GREATER THAN 100.4 F (38 C) OR HIGHER *CHILLS OR SWEATING *NAUSEA AND VOMITING THAT IS NOT CONTROLLED WITH YOUR NAUSEA MEDICATION *UNUSUAL SHORTNESS OF BREATH *UNUSUAL BRUISING OR BLEEDING *URINARY PROBLEMS (pain or burning when urinating, or frequent urination) *BOWEL PROBLEMS (unusual diarrhea, constipation, pain near the anus) TENDERNESS IN MOUTH AND THROAT WITH OR WITHOUT PRESENCE OF ULCERS (sore throat, sores in mouth, or a toothache) UNUSUAL RASH, SWELLING OR PAIN  UNUSUAL VAGINAL DISCHARGE OR ITCHING   Items with * indicate a potential emergency and should be followed up as soon as possible or go to the Emergency Department if any problems should occur.  Please show the CHEMOTHERAPY ALERT CARD or IMMUNOTHERAPY ALERT CARD at check-in to the Emergency Department and triage nurse.  Should you have questions after your visit or need to  cancel or reschedule your appointment, please contact [facility] AT  [phone]  and follow the prompts.  Office hours are 8:00 a.m. to 4:30 p.m. Monday - Friday. Please note that voicemails left after 4:00 p.m. may not be returned until the following business day.  We are closed weekends and major holidays. You have access to a nurse at all times for urgent questions. Please call the main number to the clinic [phone] and follow the prompts.  For any non-urgent questions, you may also contact your provider using MyChart. We now offer e-Visits for anyone 18 and older to request care online for non-urgent symptoms. For details visit [email or web address].   Also download the MyChart app! Go to the app store, search "MyChart", open the app, select [facility], and log in with your MyChart username and password.   

## 2022-07-04 NOTE — Progress Notes (Signed)
We will hold treatment today per Dr. Delton Coombes due to URI and persistent diarrhea.  We will give IVF over 2 hours per Dr. Delton Coombes.  Vital signs are stable.  Labs reviewed by Dr. Delton Coombes during her office visit.  We will proceed with IVF per MD orders.  Patient tolerated IVF well with no complaints voiced.  Patient left via wheelchair with daughter in stable condition.  Vital signs stable at discharge.  Follow up as scheduled.

## 2022-07-05 ENCOUNTER — Encounter: Payer: Self-pay | Admitting: Radiology

## 2022-07-06 ENCOUNTER — Encounter: Payer: Self-pay | Admitting: Hematology

## 2022-07-06 ENCOUNTER — Ambulatory Visit: Payer: No Typology Code available for payment source

## 2022-07-06 NOTE — Progress Notes (Signed)
Marilyn Ford, [place] [address]   CLINIC:  Medical Oncology/Hematology  PCP:  Marilyn Fraise, MD [place]  [phone]   REASON FOR VISIT:  Follow-up for locally advanced TNBC  PRIOR THERAPY: None  NGS Results: Not done  CURRENT THERAPY: Pembrolizumab, carboplatin and paclitaxel followed by pembrolizumab, Adriamycin and Cytoxan  BRIEF ONCOLOGIC HISTORY:  Oncology History Overview Note   Cancer Staging  Malignant neoplasm of overlapping sites of right female breast [facility]) Staging form: Breast, AJCC 8th Edition - Clinical stage from 03/08/2022: Stage IIIC (cT2, cN2, cM0, G3, ER-, PR-, HER2-) - Signed by Truitt Merle, MD on 03/08/2022    Malignant neoplasm of overlapping sites of right female breast [facility])  02/20/2022 Mammogram   CLINICAL DATA:  62 year old female recalled from screening mammography 03/01/2021 for right breast calcifications and subsequent benign discordant biopsy of these calcifications in the central posterior right breast December 2022 with excision recommended. The patient initially followed up with surgery in April however canceled her scheduled surgery and most recently followed up with Dr. [name] September 2023 with diagnostic imaging, possible RF tag placement and subsequent excision recommended.   EXAM: DIGITAL DIAGNOSTIC BILATERAL MAMMOGRAM WITH TOMOSYNTHESIS; ULTRASOUND RIGHT BREAST LIMITED  MPRESSION: 1.  Suspicious right axillary lymphadenopathy.   2. Indeterminate intramammary lymph node in the right breast at 9 o'clock.   3. 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   4. Decreased calcifications noted at prior benign biopsy site in the lower central posterior right breast at site of X shaped biopsy marking clip.   02/20/2022  Initial Biopsy   FINAL MICROSCOPIC DIAGNOSIS:   A. AXILLA, RIGHT, LYMPH NODE, NEEDLE CORE BIOPSY:  - Positive for carcinoma (see Comment)   B. LYMPH NODE, RIGHT BREAST, BIOPSY:  - Negative for carcinoma   COMMENT:  Part A: Morphology and immunohistochemical staining are most compatible with primary breast carcinoma with metaplastic changes, however differential diagnosis also includes urothelial carcinoma and less likely primary lung carcinoma (squamous).  No lymphoid tissue is identified.  Clinical and radiologic correlation is suggested.   ADDENDUM:  In case of a breast origin, the appropriate grade would be grade 3  (3+3+2)   ADDENDUM:  PROGNOSTIC INDICATOR RESULTS:  The tumor cells are EQUIVOCAL for Her2 (2+).  Estrogen Receptor:       0%, NEGATIVE  Progesterone Receptor:   0%, NEGATIVE  Proliferation Marker Ki-67:   60%   ADDENDUM:  FLOURESCENCE IN-SITU HYBRIDIZATION RESULTS:  GROUP 5:   HER2 **NEGATIVE**    03/02/2022 Imaging   EXAM: BILATERAL BREAST MRI WITH AND WITHOUT CONTRAST  IMPRESSION: 1. There is a suspicious 3.8 cm area of patchy non mass enhancement in a linear orientation in the slightly upper outer right breast in the mid to posterior depth spanning 3.8 cm.   2. Diffuse skin thickening with enhancement of the skin, concerning for inflammatory breast cancer.   3. Numerous bulky matted lymph nodes in the right axilla, one of which corresponds with the biopsy-proven metastatic lymph node.   4.  No evidence of left breast malignancy.   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 ([name])   03/08/2022 Cancer Staging   Staging form: Breast, AJCC 8th Edition - Clinical stage from 03/08/2022: Stage IIIC (cT4d, cN2, cM0, G3, ER-, PR-, HER2-) -  Signed by Truitt Merle, MD on 03/08/2022 Histologic grading system: 3 grade system   03/19/2022 Pathology Results   Diagnosis Breast, right, needle core biopsy, upper outer  quadrant, barbell clip BENIGN BREAST WITH FIBROCYSTIC CHANGES INCLUDING STROMAL FIBROSIS, ADENOSIS AND USUAL DUCT HYPERPLASIA BENIGN FIBROMATOID CHANGE NEGATIVE FOR MICROCALCIFICATIONS NEGATIVE FOR CARCINOMA   03/22/2022 PET scan   IMPRESSION: Bulky hypermetabolic right axillary and subpectoral lymphadenopathy, consistent with metastatic disease.   No other definite sites of metastatic disease identified.   6 mm right lower lobe pulmonary nodule shows no FDG uptake, but is too small to definitively characterize by PET. Recommend continued follow-up by chest CT in 3-4 months.   Aortic Atherosclerosis (ICD10-I70.0).   04/02/2022 -  Chemotherapy   Patient is on Treatment Plan : BREAST Pembrolizumab (200) D1 + Carboplatin (5) D1 + Paclitaxel (80) D1,8,15 q21d X 4 cycles / Pembrolizumab (200) D1 + AC D1 q21d x 4 cycles      Genetic Testing   Negative genetic testing. No pathogenic variants identified on the [facility] Common Hereditary Cancers+RNA panel. The report date is 05/31/2022.  The Common Hereditary Cancers Panel + RNA offered by [facility] includes sequencing and/or deletion duplication testing of the following 48 genes: APC*, ATM*, AXIN2, BAP1, BARD1, BMPR1A, BRCA1, BRCA2, BRIP1, CDH1, CDK4, CDKN2A (p14ARF), CDKN2A (p16INK4a), CHEK2, CTNNA1, DICER1*, EPCAM*, FH*, GREM1*, HOXB13, KIT, MBD4, MEN1*, MLH1*, MSH2*, MSH3*, MSH6*, MUTYH, NF1*, NTHL1, PALB2, PDGFRA, PMS2*, POLD1*, POLE, PTEN*, RAD51C, RAD51D, SDHA*, SDHB, SDHC*, SDHD, SMAD4, SMARCA4, STK11, TP53, TSC1*, TSC2, VHL.      CANCER STAGING:  Cancer Staging  Malignant neoplasm of overlapping sites of right female breast [facility]) Staging form: Breast, AJCC 8th Edition - Clinical stage from 03/08/2022: Stage IIIC (cT4d, cN2, cM0, G3, ER-, PR-, HER2-) - Signed by Truitt Merle, MD on 03/08/2022    INTERVAL HISTORY:  Ms. Nour 62 y.o. female seen for follow-up of triple negative breast cancer and toxicity assessment prior to starting  Adriamycin and Cytoxan along with Keytruda.  Patient reports some cough with yellow expectoration.  She lost about 5 pounds since last visit.  She reportedly had tested positive COVID around 06/22/2022 at home.  She reported having vomiting and diarrhea 3-4 times per day.  Vomiting predominantly after breakfast.  She is getting diarrhea 3-4 times per day during daytime and 3-4 times at nighttime.  She is taking Imodium 2 tablets in the beginning followed by 1 tablet after each watery bowel movement and occasionally it does not help.   REVIEW OF SYSTEMS:  Review of Systems  HENT:   Positive for trouble swallowing.   Respiratory:  Positive for cough and shortness of breath (On exertion).   Gastrointestinal:  Positive for diarrhea, nausea and vomiting.  Neurological:  Positive for headaches.  Psychiatric/Behavioral:  Positive for sleep disturbance.   All other systems reviewed and are negative.    PAST MEDICAL/SURGICAL HISTORY:  Past Medical History:  Diagnosis Date   Allergy    Anxiety    Breast cancer ([place])    Diabetes mellitus without complication (HCC)    GERD (gastroesophageal reflux disease)    Heart murmur    as a child   History of kidney stones    Hypertension    Pneumonia    Stroke [facility])    Vaginal Pap smear, abnormal    Past Surgical History:  Procedure Laterality Date   CESAREAN SECTION     CHOLECYSTECTOMY     COLONOSCOPY WITH PROPOFOL N/A 07/19/2020   Procedure: COLONOSCOPY WITH  PROPOFOL;  Surgeon: Montez Morita, Quillian Quince, MD;  Location: [facility] END[facility];  Service: Gastroenterology;  Laterality: N/A;  AM   POLYPECTOMY  07/19/2020   Procedure: POLYPECTOMY;  Surgeon: Montez Morita, Quillian Quince, MD;  Location: [facility] END[facility] SUITE;  Service: Gastroenterology;;   PORTACATH PLACEMENT N/A 03/20/2022   Procedure: INSERTION PORT-A-CATH;  Surgeon: Coralie Keens, MD;  Location: [facility] ORS;  Service: General;  Laterality: N/A;     SOCIAL HISTORY:  Social History   Socioeconomic  History   Marital status: Married    Spouse name: Jeneen Rinks   Number of children: 2   Years of education: 10   Highest education level: 10th grade  Occupational History   Occupation: disabled  Tobacco Use   Smoking status: Former    Packs/day: 0.50    Years: 25.00    Total pack years: 12.50    Types: Cigarettes    Start date: 12/08/1999    Quit date: 03/12/2015    Years since quitting: 7.3   Smokeless tobacco: Never  Vaping Use   Vaping Use: Never used  Substance and Sexual Activity   Alcohol use: Yes    Alcohol/week: 2.0 - 3.0 standard drinks of alcohol    Types: 2 - 3 Glasses of wine per week   Drug use: No   Sexual activity: Not Currently    Birth control/protection: Abstinence, Post-menopausal  Other Topics Concern   Not on file  Social History Narrative   Volunteers at [employer] for a few hours every day.    She really enjoys getting out of of the house and working there.    Social Determinants of Health   Financial Resource Strain: Low Risk  (03/01/2022)   Overall Financial Resource Strain (CARDIA)    Difficulty of Paying Living Expenses: Not very hard  Food Insecurity: No Food Insecurity (04/09/2022)   Hunger Vital Sign    Worried About Running Out of Food in the Last Year: Never true    Ran Out of Food in the Last Year: Never true  Transportation Needs: No Transportation Needs (03/01/2022)   PRAPARE - Hydrologist (Medical): No    Lack of Transportation (Non-Medical): No  Physical Activity: Insufficiently Active (03/01/2022)   Exercise Vital Sign    Days of Exercise per Week: 1 day    Minutes of Exercise per Session: 10 min  Stress: Stress Concern Present (03/01/2022)   [place]    Feeling of Stress : Rather much  Social Connections: Moderately Isolated (03/01/2022)   Social Connection and Isolation Panel [NHANES]    Frequency of Communication with Friends  and Family: Twice a week    Frequency of Social Gatherings with Friends and Family: Twice a week    Attends Religious Services: Never    Marine scientist or Organizations: No    Attends Archivist Meetings: Never    Marital Status: Married  Human resources officer Violence: Not At Risk (04/09/2022)   Humiliation, Afraid, Rape, and Kick questionnaire    Fear of Current or Ex-Partner: No    Emotionally Abused: No    Physically Abused: No    Sexually Abused: No    FAMILY HISTORY:  Family History  Problem Relation Age of Onset   Diabetes Mother    Uterine cancer Mother 19 - 75   Diabetes Brother    Breast cancer Maternal Aunt        dx >50, d. from  cancer   Cancer Maternal Grandmother        unk type, "back cancer?"    CURRENT MEDICATIONS:  Outpatient Encounter Medications as of 07/04/2022  Medication Sig   Alcohol Swabs (B-D SINGLE USE SWABS REGULAR) PADS Test BS daily and as needed Dx E11.9   amLODipine (NORVASC) 10 MG tablet 1 tablet daily   Apple Cider Vinegar 500 MG TABS Take 500 mg by mouth in the morning.   aspirin 81 MG EC tablet Take 1 tablet (81 mg total) by mouth daily.   atorvastatin (LIPITOR) 80 MG tablet TAKE 1 TABLET EVERY DAY AT 6PM   Blood Glucose Calibration (TRUE METRIX LEVEL 1) Low SOLN Use with glucometer Dx E11.9   Blood Glucose Monitoring Suppl (TRUE METRIX AIR GLUCOSE METER) w/Device KIT Test BS daily and as needed Dx E11.9   Calcium Carb-Cholecalciferol (CALCIUM-VITAMIN D) 600-400 MG-UNIT TABS Take 1 tablet by mouth in the morning.   clopidogrel (PLAVIX) 75 MG tablet Take 75 mg by mouth daily.   Coenzyme Q10 (COQ10) 100 MG CAPS Take 100 mg by mouth in the morning.   Cranberry 425 MG CAPS Take 425 mg by mouth in the morning and at bedtime.   Dulaglutide (TRULICITY) 4.5 0000000 SOPN Inject 4.5 mg as directed once a week.   Flaxseed, Linseed, (FLAXSEED OIL) 1000 MG CAPS Take 1,000 mg by mouth in the morning.   gabapentin (NEURONTIN) 300 MG capsule  Take 1 capsule (300 mg total) by mouth 3 (three) times daily.   glucose blood (TRUE METRIX BLOOD GLUCOSE TEST) test strip Test BS daily and as needed Dx E11.9   icosapent Ethyl (VASCEPA) 1 g capsule Take 2 g by mouth 2 (two) times daily.   Krill Oil 500 MG CAPS Take 3 capsules (1,500 mg total) by mouth in the morning and at bedtime.   LORazepam (ATIVAN) 1 MG tablet Take 1 mg by mouth every 8 (eight) hours as needed.   magnesium oxide (MAG-OX) 400 (240 Mg) MG tablet TAKE 1 TABLET BY MOUTH TWICE A DAY   meloxicam (MOBIC) 15 MG tablet Take 15 mg by mouth daily.   metoprolol (TOPROL-XL) 200 MG 24 hr tablet TAKE 1 TABLET ONE TIME DAILY, WITH OR IMMEDIATELY FOLLOWING A MEAL   Multiple Vitamin (MULTIVITAMIN) capsule Take 1 capsule by mouth in the morning.   ondansetron (ZOFRAN) 8 MG tablet Take 1 tablet (8 mg total) by mouth every 8 (eight) hours as needed for nausea or vomiting. Start on the third day after chemotherapy.   pantoprazole (PROTONIX) 40 MG tablet TAKE 1 TABLET twice daily FOR STOMACH   predniSONE (DELTASONE) 50 MG tablet Take 1 tablet (50 mg total) by mouth daily with breakfast.   traMADol (ULTRAM) 50 MG tablet Take 1 tablet (50 mg total) by mouth every 6 (six) hours as needed for moderate pain or severe pain.   traZODone (DESYREL) 150 MG tablet TAKE 1 OR 2 TABLETS AT BEDTIME FOR SLEEP   TRUEplus Lancets 33G MISC Test BS daily and as needed Dx E11.9   valsartan (DIOVAN) 320 MG tablet Take 1 tablet (320 mg total) by mouth daily. For blood pressure.   zinc gluconate 50 MG tablet Take 50 mg by mouth daily.   lidocaine-prilocaine (EMLA) cream Apply to affected area once   ondansetron (ZOFRAN-ODT) 8 MG disintegrating tablet Take 1 tablet (8 mg total) by mouth every 8 (eight) hours as needed for nausea or vomiting.   prochlorperazine (COMPAZINE) 10 MG tablet Take 1 tablet (10 mg  total) by mouth every 6 (six) hours as needed for nausea or vomiting.   [DISCONTINUED] sodium chloride flush (NS) 0.9  % injection 10 mL    No facility-administered encounter medications on file as of 07/04/2022.    ALLERGIES:  Allergies  Allergen Reactions   Penicillins Shortness Of Breath   Sulfa Antibiotics Rash     PHYSICAL EXAM:  ECOG Performance status: 1  Vitals:   07/04/22 0941  BP: 124/70  Pulse: 86  Resp: 18  Temp: 98.9 F (37.2 C)  SpO2: 100%   Filed Weights   07/04/22 0941  Weight: 143 lb (64.9 kg)   Physical Exam Vitals reviewed.  Constitutional:      Appearance: Normal appearance.  Cardiovascular:     Rate and Rhythm: Normal rate and regular rhythm.     Heart sounds: Normal heart sounds.  Pulmonary:     Effort: Pulmonary effort is normal.     Breath sounds: Normal breath sounds.  Abdominal:     Palpations: Abdomen is soft. There is no mass.  Neurological:     Mental Status: She is alert.  Psychiatric:        Mood and Affect: Mood normal.        Behavior: Behavior normal.      LABORATORY DATA:  I have reviewed the labs as listed.  CBC    Component Value Date/Time   WBC 6.8 07/04/2022 0911   RBC 2.73 (L) 07/04/2022 0911   HGB 8.4 (L) 07/04/2022 0911   HGB 9.7 (L) 06/11/2022 0900   HCT 25.9 (L) 07/04/2022 0911   HCT 29.8 (L) 06/11/2022 0900   PLT 149 (L) 07/04/2022 0911   PLT 169 06/11/2022 0900   MCV 94.9 07/04/2022 0911   MCV 92 06/11/2022 0900   MCH 30.8 07/04/2022 0911   MCHC 32.4 07/04/2022 0911   RDW 23.0 (H) 07/04/2022 0911   RDW 20.3 (H) 06/11/2022 0900   LYMPHSABS 1.5 07/04/2022 0911   LYMPHSABS 0.9 06/11/2022 0900   MONOABS 0.1 07/04/2022 0911   EOSABS 0.0 07/04/2022 0911   EOSABS 0.0 06/11/2022 0900   BASOSABS 0.1 07/04/2022 0911   BASOSABS 0.0 06/11/2022 0900      Latest Ref Rng & Units 07/04/2022    9:11 AM 06/20/2022   12:43 PM 06/13/2022    9:24 AM  CMP  Glucose 70 - 99 mg/dL 125  114  145   BUN 8 - 23 mg/dL 14  22  32   Creatinine 0.44 - 1.00 mg/dL 1.05  1.09  1.01   Sodium 135 - 145 mmol/L 135  135  136   Potassium 3.5 - 5.1  mmol/L 4.1  4.4  4.2   Chloride 98 - 111 mmol/L 100  100  100   CO2 22 - 32 mmol/L '27  25  26   '$ Calcium 8.9 - 10.3 mg/dL 8.4  9.1  9.9   Total Protein 6.5 - 8.1 g/dL 6.4  6.8  7.1   Total Bilirubin 0.3 - 1.2 mg/dL 1.1  0.8  0.8   Alkaline Phos 38 - 126 U/L 151  120  138   AST 15 - 41 U/L 24  28  33   ALT 0 - 44 U/L 31  33  60     DIAGNOSTIC IMAGING:  I have independently reviewed the scans and discussed with the patient.  ASSESSMENT: 1.  Inflammatory right breast cancer: - Bilateral diagnostic mammogram (02/20/2022): Suspicious right axillary lymphadenopathy.  Indeterminate intramammary lymph node  in the right breast at 9:00.  New right breast skin and trabecular thickening, related to vascular congestion from enlarged lymph nodes in the right axilla. - Right axillary lymph node core biopsy (02/20/2022): Morphology and IHC compatible with primary breast cancer with differential diagnosis of urothelial carcinoma and primary lung carcinoma (squamous).  Grade 3.  ER 0%, PR 0%, HER2 2+, Ki-67 60%, HER2 negative by FISH. - Right breast lymph node biopsy at 9:00 (02/20/2022): Negative for carcinoma. - MRI breast (03/02/2022): In the upper outer right breast, mid to posterior depth there is a patchy clumped non-mass enhancement in a linear orientation spanning approximately 3.8 cm.  There is diffuse thickening of the skin in the right breast with skin enhancement.  Left breast with no mass or abnormal enhancement.  Numerous bulky matted lymph nodes in the right axilla. - Right breast UOQ biopsy (03/19/2022): Benign breast with fibrocystic changes including stromal fibrosis, adenosis, usual ductal hyperplasia.  Negative for carcinoma. - PET scan (03/22/2022): Bulky hypermetabolic right axillary and subpectoral lymphadenopathy.  No other definite new sites of metastatic disease.  6 mm right lower lobe lung nodule with no FDG uptake.       PLAN:  1.  Triple negative right breast cancer: - She  reported some cough with yellow sputum after testing positive for COVID at home around 06/22/2022. - She is having vomiting in the morning after breakfast. - She is having diarrhea 3-4 times during daytime and 3-4 times at nighttime. - It is not clear if it is immunotherapy related diarrhea. - Recommend checking stool for C. difficile and GI panel. - I will hold her treatment today.  She will receive 1 L of fluids with electrolytes. - We will give her prednisone 50 mg p.o. daily for 5 days. - Will reevaluate her next week for treatment if the diarrhea resolves.  2.  Hypomagnesemia: - She is taking magnesium 2 tablets twice daily.  Magnesium is normal today.      Orders placed this encounter:  Orders Placed This Encounter  Procedures   GI pathogen panel by PCR, stool   C difficile quick screen w PCR reflex       Derek Jack, MD [place] [phone]

## 2022-07-09 ENCOUNTER — Ambulatory Visit (HOSPITAL_COMMUNITY)
Admission: RE | Admit: 2022-07-09 | Discharge: 2022-07-09 | Disposition: A | Discharge status: Home / Self Care | Payer: No Typology Code available for payment source | Source: Ambulatory Visit | Attending: Hematology | Admitting: Hematology

## 2022-07-09 DIAGNOSIS — I10 Essential (primary) hypertension: Secondary | ICD-10-CM | POA: Insufficient documentation

## 2022-07-09 DIAGNOSIS — Z09 Encounter for follow-up examination after completed treatment for conditions other than malignant neoplasm: Secondary | ICD-10-CM | POA: Insufficient documentation

## 2022-07-09 DIAGNOSIS — Z0189 Encounter for other specified special examinations: Secondary | ICD-10-CM

## 2022-07-09 DIAGNOSIS — E119 Type 2 diabetes mellitus without complications: Secondary | ICD-10-CM | POA: Insufficient documentation

## 2022-07-09 DIAGNOSIS — Z79899 Other long term (current) drug therapy: Secondary | ICD-10-CM | POA: Diagnosis not present

## 2022-07-09 DIAGNOSIS — I081 Rheumatic disorders of both mitral and tricuspid valves: Secondary | ICD-10-CM | POA: Diagnosis not present

## 2022-07-09 LAB — ECHOCARDIOGRAM COMPLETE
Area-P 1/2: 3.61 cm2
Calc EF: 62.7 %
S' Lateral: 2.4 cm
Single Plane A2C EF: 65.6 %
Single Plane A4C EF: 62.6 %

## 2022-07-09 NOTE — Progress Notes (Signed)
Echocardiogram 2D Echocardiogram has been performed.  Marilyn Ford 07/09/2022, 8:41 AM

## 2022-07-12 ENCOUNTER — Inpatient Hospital Stay: Payer: No Typology Code available for payment source

## 2022-07-12 ENCOUNTER — Inpatient Hospital Stay (HOSPITAL_BASED_OUTPATIENT_CLINIC_OR_DEPARTMENT_OTHER): Payer: No Typology Code available for payment source | Admitting: Hematology

## 2022-07-12 ENCOUNTER — Inpatient Hospital Stay: Payer: No Typology Code available for payment source | Attending: Hematology and Oncology

## 2022-07-12 ENCOUNTER — Ambulatory Visit: Payer: No Typology Code available for payment source | Admitting: Hematology

## 2022-07-12 ENCOUNTER — Ambulatory Visit: Payer: No Typology Code available for payment source

## 2022-07-12 VITALS — BP 126/85 | HR 86 | Temp 97.1°F | Resp 16 | Wt 144.0 lb

## 2022-07-12 VITALS — BP 123/68 | HR 72 | Temp 96.6°F | Resp 18

## 2022-07-12 DIAGNOSIS — C50811 Malignant neoplasm of overlapping sites of right female breast: Secondary | ICD-10-CM | POA: Diagnosis not present

## 2022-07-12 DIAGNOSIS — Z5189 Encounter for other specified aftercare: Secondary | ICD-10-CM | POA: Insufficient documentation

## 2022-07-12 DIAGNOSIS — Z171 Estrogen receptor negative status [ER-]: Secondary | ICD-10-CM

## 2022-07-12 DIAGNOSIS — Z79899 Other long term (current) drug therapy: Secondary | ICD-10-CM | POA: Insufficient documentation

## 2022-07-12 DIAGNOSIS — Z5111 Encounter for antineoplastic chemotherapy: Secondary | ICD-10-CM | POA: Insufficient documentation

## 2022-07-12 DIAGNOSIS — Z5112 Encounter for antineoplastic immunotherapy: Secondary | ICD-10-CM | POA: Diagnosis not present

## 2022-07-12 LAB — CBC WITH DIFFERENTIAL/PLATELET
Abs Immature Granulocytes: 0.08 10*3/uL — ABNORMAL HIGH (ref 0.00–0.07)
Basophils Absolute: 0.1 10*3/uL (ref 0.0–0.1)
Basophils Relative: 1 %
Eosinophils Absolute: 0.1 10*3/uL (ref 0.0–0.5)
Eosinophils Relative: 1 %
HCT: 30.3 % — ABNORMAL LOW (ref 36.0–46.0)
Hemoglobin: 9.7 g/dL — ABNORMAL LOW (ref 12.0–15.0)
Immature Granulocytes: 1 %
Lymphocytes Relative: 15 %
Lymphs Abs: 1.3 10*3/uL (ref 0.7–4.0)
MCH: 32 pg (ref 26.0–34.0)
MCHC: 32 g/dL (ref 30.0–36.0)
MCV: 100 fL (ref 80.0–100.0)
Monocytes Absolute: 0.7 10*3/uL (ref 0.1–1.0)
Monocytes Relative: 8 %
Neutro Abs: 6.6 10*3/uL (ref 1.7–7.7)
Neutrophils Relative %: 74 %
Platelets: 384 10*3/uL (ref 150–400)
RBC: 3.03 MIL/uL — ABNORMAL LOW (ref 3.87–5.11)
RDW: 25.5 % — ABNORMAL HIGH (ref 11.5–15.5)
WBC: 8.8 10*3/uL (ref 4.0–10.5)
nRBC: 0 % (ref 0.0–0.2)

## 2022-07-12 LAB — COMPREHENSIVE METABOLIC PANEL
ALT: 37 U/L (ref 0–44)
AST: 29 U/L (ref 15–41)
Albumin: 3.4 g/dL — ABNORMAL LOW (ref 3.5–5.0)
Alkaline Phosphatase: 104 U/L (ref 38–126)
Anion gap: 10 (ref 5–15)
BUN: 16 mg/dL (ref 8–23)
CO2: 25 mmol/L (ref 22–32)
Calcium: 8.7 mg/dL — ABNORMAL LOW (ref 8.9–10.3)
Chloride: 101 mmol/L (ref 98–111)
Creatinine, Ser: 0.84 mg/dL (ref 0.44–1.00)
GFR, Estimated: >60 mL/min (ref >60)
Glucose, Bld: 139 mg/dL — ABNORMAL HIGH (ref 70–99)
Potassium: 3.9 mmol/L (ref 3.5–5.1)
Sodium: 136 mmol/L (ref 135–145)
Total Bilirubin: 0.6 mg/dL (ref 0.3–1.2)
Total Protein: 6.5 g/dL (ref 6.5–8.1)

## 2022-07-12 LAB — SAMPLE TO BLOOD BANK

## 2022-07-12 LAB — MAGNESIUM: Magnesium: 2.2 mg/dL (ref 1.7–2.4)

## 2022-07-12 MED ORDER — PALONOSETRON HCL INJECTION 0.25 MG/5ML
0.2500 mg | Freq: Once | INTRAVENOUS | Status: AC
  Administered 2022-07-12: 0.25 mg via INTRAVENOUS
  Filled 2022-07-12: qty 5

## 2022-07-12 MED ORDER — HEPARIN SOD (PORK) LOCK FLUSH 100 UNIT/ML IV SOLN
500.0000 [IU] | Freq: Once | INTRAVENOUS | Status: AC | PRN
  Administered 2022-07-12: 500 [IU]

## 2022-07-12 MED ORDER — SODIUM CHLORIDE 0.9 % IV SOLN
150.0000 mg | Freq: Once | INTRAVENOUS | Status: AC
  Administered 2022-07-12: 150 mg via INTRAVENOUS
  Filled 2022-07-12: qty 150

## 2022-07-12 MED ORDER — SODIUM CHLORIDE 0.9 % IV SOLN
200.0000 mg | Freq: Once | INTRAVENOUS | Status: AC
  Administered 2022-07-12: 200 mg via INTRAVENOUS
  Filled 2022-07-12: qty 8

## 2022-07-12 MED ORDER — DOXORUBICIN HCL CHEMO IV INJECTION 2 MG/ML
60.0000 mg/m2 | Freq: Once | INTRAVENOUS | Status: AC
  Administered 2022-07-12: 112 mg via INTRAVENOUS
  Filled 2022-07-12: qty 56

## 2022-07-12 MED ORDER — SODIUM CHLORIDE 0.9% FLUSH
10.0000 mL | INTRAVENOUS | Status: DC | PRN
  Administered 2022-07-12: 10 mL

## 2022-07-12 MED ORDER — SODIUM CHLORIDE 0.9 % IV SOLN: Freq: Once | INTRAVENOUS | Status: AC

## 2022-07-12 MED ORDER — SODIUM CHLORIDE 0.9 % IV SOLN
600.0000 mg/m2 | Freq: Once | INTRAVENOUS | Status: AC
  Administered 2022-07-12: 1120 mg via INTRAVENOUS
  Filled 2022-07-12: qty 56

## 2022-07-12 NOTE — Progress Notes (Signed)
Pharmacist Chemotherapy Monitoring - Initial Assessment    Anticipated start date: 07/12/22   The following has been reviewed per standard work regarding the patient's treatment regimen: The patient's diagnosis, treatment plan and drug doses, and organ/hematologic function Lab orders and baseline tests specific to treatment regimen  The treatment plan start date, drug sequencing, and pre-medications Prior authorization status  Patient's documented medication list, including drug-drug interaction screen and prescriptions for anti-emetics and supportive care specific to the treatment regimen The drug concentrations, fluid compatibility, administration routes, and timing of the medications to be used The patient's access for treatment and lifetime cumulative dose history, if applicable  The patient's medication allergies and previous infusion related reactions, if applicable   Changes made to treatment plan:  N/A  Follow up needed:  N/A   Wynona Neat, [facility], 07/12/2022  9:19 AM

## 2022-07-12 NOTE — Progress Notes (Signed)
[address] [place] [address]    Clinic Day:  07/12/2022  Referring physician: Claretta Fraise, MD  Patient Care Team: Claretta Fraise, MD as PCP - General (Family Medicine) Claretta Fraise, MD (Family Medicine) Lavera Guise, [facility] as Pharmacist (Family Medicine) Montez Morita, Quillian Quince, MD as Consulting Physician (Gastroenterology) Aviva Signs, MD as Consulting Physician (General Surgery) Mauro Kaufmann, RN as Oncology Nurse Navigator Rockwell Germany, RN as Oncology Nurse Navigator Derek Jack, MD as Medical Oncologist (Medical Oncology) Brien Mates, RN as Oncology Nurse Navigator (Medical Oncology) Derek Jack, MD as Consulting Physician (Hematology) Claretta Fraise, MD as Referring Physician (Family Medicine)   ASSESSMENT & PLAN:   Assessment: 1.  Inflammatory right breast cancer: - Bilateral diagnostic mammogram (02/20/2022): Suspicious right axillary lymphadenopathy.  Indeterminate intramammary lymph node in the right breast at 9:00.  New right breast skin and trabecular thickening, related to vascular congestion from enlarged lymph nodes in the right axilla. - Right axillary lymph node core biopsy (02/20/2022): Morphology and IHC compatible with primary breast cancer with differential diagnosis of urothelial carcinoma and primary lung carcinoma (squamous).  Grade 3.  ER 0%, PR 0%, HER2 2+, Ki-67 60%, HER2 negative by FISH. - Right breast lymph node biopsy at 9:00 (02/20/2022): Negative for carcinoma. - MRI breast (03/02/2022): In the upper outer right breast, mid to posterior depth there is a patchy clumped non-mass enhancement in a linear orientation spanning approximately 3.8 cm.  There is diffuse thickening of the skin in the right breast with skin enhancement.  Left breast with no mass or abnormal enhancement.  Numerous bulky matted lymph nodes in the right axilla. - Right breast UOQ biopsy (03/19/2022): Benign  breast with fibrocystic changes including stromal fibrosis, adenosis, usual ductal hyperplasia.  Negative for carcinoma. - PET scan (03/22/2022): Bulky hypermetabolic right axillary and subpectoral lymphadenopathy.  No other definite new sites of metastatic disease.  6 mm right lower lobe lung nodule with no FDG uptake.  Plan: 1.  Triple negative right breast cancer: - She did not have any diarrhea after last week.  She could not even provide sample for C. difficile and GI panel. - She completed prednisone for 5 days. - She is eating better.  She gained 1 pound. - I reviewed 2D echocardiogram from 07/09/2022, EF 60 to 65%. - We talked about initiating her on [employer] regimen with pembrolizumab. - We reviewed labs today.  LFTs, CBC was grossly normal. - Proceed with first cycle today at regular dose.  She will be evaluated in our symptom management clinic in 10 days.  RTC 3 weeks for follow-up with me.   2.  Hypomagnesemia: - She is taking magnesium 2 tablets twice daily.  Magnesium is 2.2. - I have recommended cutting down magnesium to 1 tablet twice daily.  No orders of the defined types were placed in this encounter.     I,Alexis Herring,acting as a Education administrator for [employer], MD.,have documented all relevant documentation on the behalf of Derek Jack, MD,as directed by  Derek Jack, MD while in the presence of Derek Jack, MD.   I, Derek Jack MD, have reviewed the above documentation for accuracy and completeness, and I agree with the above.   Derek Jack, MD   3/7/20245:34 PM  CHIEF COMPLAINT:   Diagnosis: locally advanced TNBC    Cancer Staging  Malignant neoplasm of overlapping sites of right female breast [facility]) Staging form: Breast, AJCC 8th Edition - Clinical stage from  03/08/2022: Stage IIIC (cT4d, cN2, cM0, G3, ER-, PR-, HER2-) - Signed by Truitt Merle, MD on 03/08/2022    Prior Therapy: None  Current Therapy:  Pembrolizumab,  carboplatin and paclitaxel followed by pembrolizumab, Adriamycin and Cytoxan    HISTORY OF PRESENT ILLNESS:   Oncology History Overview Note   Cancer Staging  Malignant neoplasm of overlapping sites of right female breast [facility]) Staging form: Breast, AJCC 8th Edition - Clinical stage from 03/08/2022: Stage IIIC (cT2, cN2, cM0, G3, ER-, PR-, HER2-) - Signed by Truitt Merle, MD on 03/08/2022    Malignant neoplasm of overlapping sites of right female breast [facility])  02/20/2022 Mammogram   CLINICAL DATA:  62 year old female recalled from screening mammography 03/01/2021 for right breast calcifications and subsequent benign discordant biopsy of these calcifications in the central posterior right breast December 2022 with excision recommended. The patient initially followed up with surgery in April however canceled her scheduled surgery and most recently followed up with Dr. [name] September 2023 with diagnostic imaging, possible RF tag placement and subsequent excision recommended.   EXAM: DIGITAL DIAGNOSTIC BILATERAL MAMMOGRAM WITH TOMOSYNTHESIS; ULTRASOUND RIGHT BREAST LIMITED  MPRESSION: 1.  Suspicious right axillary lymphadenopathy.   2. Indeterminate intramammary lymph node in the right breast at 9 o'clock.   3. 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   4. Decreased calcifications noted at prior benign biopsy site in the lower central posterior right breast at site of X shaped biopsy marking clip.   02/20/2022 Initial Biopsy   FINAL MICROSCOPIC DIAGNOSIS:   A. AXILLA, RIGHT, LYMPH NODE, NEEDLE CORE BIOPSY:  - Positive for carcinoma (see Comment)   B. LYMPH NODE, RIGHT BREAST, BIOPSY:  - Negative for carcinoma   COMMENT:  Part A: Morphology and immunohistochemical staining are most compatible with primary breast carcinoma with  metaplastic changes, however differential diagnosis also includes urothelial carcinoma and less likely primary lung carcinoma (squamous).  No lymphoid tissue is identified.  Clinical and radiologic correlation is suggested.   ADDENDUM:  In case of a breast origin, the appropriate grade would be grade 3  (3+3+2)   ADDENDUM:  PROGNOSTIC INDICATOR RESULTS:  The tumor cells are EQUIVOCAL for Her2 (2+).  Estrogen Receptor:       0%, NEGATIVE  Progesterone Receptor:   0%, NEGATIVE  Proliferation Marker Ki-67:   60%   ADDENDUM:  FLOURESCENCE IN-SITU HYBRIDIZATION RESULTS:  GROUP 5:   HER2 **NEGATIVE**    03/02/2022 Imaging   EXAM: BILATERAL BREAST MRI WITH AND WITHOUT CONTRAST  IMPRESSION: 1. There is a suspicious 3.8 cm area of patchy non mass enhancement in a linear orientation in the slightly upper outer right breast in the mid to posterior depth spanning 3.8 cm.   2. Diffuse skin thickening with enhancement of the skin, concerning for inflammatory breast cancer.   3. Numerous bulky matted lymph nodes in the right axilla, one of which corresponds with the biopsy-proven metastatic lymph node.   4.  No evidence of left breast malignancy.   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 ([place])   03/08/2022 Cancer Staging   Staging form: Breast, AJCC 8th Edition - Clinical stage from 03/08/2022: Stage IIIC (cT4d, cN2, cM0, G3, ER-, PR-, HER2-) - Signed by Truitt Merle, MD on 03/08/2022 Histologic grading system: 3 grade system   03/19/2022 Pathology Results   Diagnosis Breast,  right, needle core biopsy, upper outer quadrant, barbell clip BENIGN BREAST WITH FIBROCYSTIC CHANGES INCLUDING STROMAL FIBROSIS, ADENOSIS AND USUAL DUCT HYPERPLASIA BENIGN FIBROMATOID CHANGE NEGATIVE FOR MICROCALCIFICATIONS NEGATIVE FOR CARCINOMA   03/22/2022 PET scan   IMPRESSION: Bulky hypermetabolic right axillary and subpectoral lymphadenopathy, consistent  with metastatic disease.   No other definite sites of metastatic disease identified.   6 mm right lower lobe pulmonary nodule shows no FDG uptake, but is too small to definitively characterize by PET. Recommend continued follow-up by chest CT in 3-4 months.   Aortic Atherosclerosis (ICD10-I70.0).   04/02/2022 -  Chemotherapy   Patient is on Treatment Plan : BREAST Pembrolizumab (200) D1 + Carboplatin (5) D1 + Paclitaxel (80) D1,8,15 q21d X 4 cycles / Pembrolizumab (200) D1 + AC D1 q21d x 4 cycles      Genetic Testing   Negative genetic testing. No pathogenic variants identified on the [facility] Common Hereditary Cancers+RNA panel. The report date is 05/31/2022.  The Common Hereditary Cancers Panel + RNA offered by [facility] includes sequencing and/or deletion duplication testing of the following 48 genes: APC*, ATM*, AXIN2, BAP1, BARD1, BMPR1A, BRCA1, BRCA2, BRIP1, CDH1, CDK4, CDKN2A (p14ARF), CDKN2A (p16INK4a), CHEK2, CTNNA1, DICER1*, EPCAM*, FH*, GREM1*, HOXB13, KIT, MBD4, MEN1*, MLH1*, MSH2*, MSH3*, MSH6*, MUTYH, NF1*, NTHL1, PALB2, PDGFRA, PMS2*, POLD1*, POLE, PTEN*, RAD51C, RAD51D, SDHA*, SDHB, SDHC*, SDHD, SMAD4, SMARCA4, STK11, TP53, TSC1*, TSC2, VHL.       INTERVAL HISTORY:   Madalyne is a 62 y.o. female presenting to clinic today for follow up of locally advanced TNBC. She was last seen by me on 07/04/22.  Today, she states that she is doing well overall. Her appetite level is at 80%. Her energy level is at 70%. She denies any diarrhea or vomiting.  Chronic headaches are stable.  She is eating better and gained 1 pound.   PAST MEDICAL HISTORY:   Past Medical History: Past Medical History:  Diagnosis Date   Allergy    Anxiety    Breast cancer ([place])    Diabetes mellitus without complication ([place])    GERD (gastroesophageal reflux disease)    Heart murmur    as a child   History of kidney stones    Hypertension    Pneumonia    Stroke [facility])    Vaginal Pap smear, abnormal      Surgical History: Past Surgical History:  Procedure Laterality Date   CESAREAN SECTION     CHOLECYSTECTOMY     COLONOSCOPY WITH PROPOFOL N/A 07/19/2020   Procedure: COLONOSCOPY WITH PROPOFOL;  Surgeon: Harvel Quale, MD;  Location: [facility];  Service: Gastroenterology;  Laterality: N/A;  AM   POLYPECTOMY  07/19/2020   Procedure: POLYPECTOMY;  Surgeon: Harvel Quale, MD;  Location: [facility];  Service: Gastroenterology;;   PORTACATH PLACEMENT N/A 03/20/2022   Procedure: INSERTION PORT-A-CATH;  Surgeon: Coralie Keens, MD;  Location: [facility];  Service: General;  Laterality: N/A;    Social History: Social History   Socioeconomic History   Marital status: Married    Spouse name: Jeneen Rinks   Number of children: 2   Years of education: 10   Highest education level: 10th grade  Occupational History   Occupation: disabled  Tobacco Use   Smoking status: Former    Packs/day: 0.50    Years: 25.00    Total pack years: 12.50    Types: Cigarettes    Start date: 12/08/1999    Quit date: 03/12/2015    Years since quitting: 7.3  Smokeless tobacco: Never  Vaping Use   Vaping Use: Never used  Substance and Sexual Activity   Alcohol use: Yes    Alcohol/week: 2.0 - 3.0 standard drinks of alcohol    Types: 2 - 3 Glasses of wine per week   Drug use: No   Sexual activity: Not Currently    Birth control/protection: Abstinence, Post-menopausal  Other Topics Concern   Not on file  Social History Narrative   Volunteers at [employer] for a few hours every day.    She really enjoys getting out of of the house and working there.    Social Determinants of Health   Financial Resource Strain: Low Risk  (03/01/2022)   Overall Financial Resource Strain (CARDIA)    Difficulty of Paying Living Expenses: Not very hard  Food Insecurity: No Food Insecurity (04/09/2022)   Hunger Vital Sign    Worried About Running Out of Food in the Last Year: Never true     Ran Out of Food in the Last Year: Never true  Transportation Needs: No Transportation Needs (03/01/2022)   PRAPARE - Hydrologist (Medical): No    Lack of Transportation (Non-Medical): No  Physical Activity: Insufficiently Active (03/01/2022)   Exercise Vital Sign    Days of Exercise per Week: 1 day    Minutes of Exercise per Session: 10 min  Stress: Stress Concern Present (03/01/2022)   [place]    Feeling of Stress : Rather much  Social Connections: Moderately Isolated (03/01/2022)   Social Connection and Isolation Panel [NHANES]    Frequency of Communication with Friends and Family: Twice a week    Frequency of Social Gatherings with Friends and Family: Twice a week    Attends Religious Services: Never    Marine scientist or Organizations: No    Attends Archivist Meetings: Never    Marital Status: Married  Human resources officer Violence: Not At Risk (04/09/2022)   Humiliation, Afraid, Rape, and Kick questionnaire    Fear of Current or Ex-Partner: No    Emotionally Abused: No    Physically Abused: No    Sexually Abused: No    Family History: Family History  Problem Relation Age of Onset   Diabetes Mother    Uterine cancer Mother 48 - 36   Diabetes Brother    Breast cancer Maternal Aunt        dx >50, d. from cancer   Cancer Maternal Grandmother        unk type, "back cancer?"    Current Medications:  Current Outpatient Medications:    Alcohol Swabs (B-D SINGLE USE SWABS REGULAR) PADS, Test BS daily and as needed Dx E11.9, Disp: 100 each, Rfl: 3   amLODipine (NORVASC) 10 MG tablet, 1 tablet daily, Disp: 90 tablet, Rfl: 3   Apple Cider Vinegar 500 MG TABS, Take 500 mg by mouth in the morning., Disp: , Rfl:    aspirin 81 MG EC tablet, Take 1 tablet (81 mg total) by mouth daily., Disp: 30 tablet, Rfl: 0   atorvastatin (LIPITOR) 80 MG tablet, TAKE 1 TABLET EVERY DAY  AT 6PM, Disp: 90 tablet, Rfl: 3   Blood Glucose Calibration (TRUE METRIX LEVEL 1) Low SOLN, Use with glucometer Dx E11.9, Disp: 3 each, Rfl: 0   Blood Glucose Monitoring Suppl (TRUE METRIX AIR GLUCOSE METER) w/Device KIT, Test BS daily and as needed Dx E11.9, Disp: 1 kit, Rfl:  0   Calcium Carb-Cholecalciferol (CALCIUM-VITAMIN D) 600-400 MG-UNIT TABS, Take 1 tablet by mouth in the morning., Disp: , Rfl:    clopidogrel (PLAVIX) 75 MG tablet, Take 75 mg by mouth daily., Disp: , Rfl:    Coenzyme Q10 (COQ10) 100 MG CAPS, Take 100 mg by mouth in the morning., Disp: , Rfl:    Cranberry 425 MG CAPS, Take 425 mg by mouth in the morning and at bedtime., Disp: , Rfl:    Dulaglutide (TRULICITY) 4.5 0000000 SOPN, Inject 4.5 mg as directed once a week., Disp: 6 mL, Rfl: 1   Flaxseed, Linseed, (FLAXSEED OIL) 1000 MG CAPS, Take 1,000 mg by mouth in the morning., Disp: , Rfl:    gabapentin (NEURONTIN) 300 MG capsule, Take 1 capsule (300 mg total) by mouth 3 (three) times daily., Disp: 90 capsule, Rfl: 0   glucose blood (TRUE METRIX BLOOD GLUCOSE TEST) test strip, Test BS daily and as needed Dx E11.9, Disp: 100 each, Rfl: 3   icosapent Ethyl (VASCEPA) 1 g capsule, Take 2 g by mouth 2 (two) times daily., Disp: , Rfl:    Krill Oil 500 MG CAPS, Take 3 capsules (1,500 mg total) by mouth in the morning and at bedtime., Disp: , Rfl:    lidocaine-prilocaine (EMLA) cream, Apply to affected area once, Disp: 30 g, Rfl: 3   LORazepam (ATIVAN) 1 MG tablet, Take 1 mg by mouth every 8 (eight) hours as needed., Disp: , Rfl:    magnesium oxide (MAG-OX) 400 (240 Mg) MG tablet, TAKE 1 TABLET BY MOUTH TWICE A DAY, Disp: 60 tablet, Rfl: 1   meloxicam (MOBIC) 15 MG tablet, Take 15 mg by mouth daily., Disp: , Rfl:    metoprolol (TOPROL-XL) 200 MG 24 hr tablet, TAKE 1 TABLET ONE TIME DAILY, WITH OR IMMEDIATELY FOLLOWING A MEAL, Disp: 90 tablet, Rfl: 3   Multiple Vitamin (MULTIVITAMIN) capsule, Take 1 capsule by mouth in the morning.,  Disp: , Rfl:    ondansetron (ZOFRAN) 8 MG tablet, Take 1 tablet (8 mg total) by mouth every 8 (eight) hours as needed for nausea or vomiting. Start on the third day after chemotherapy., Disp: 30 tablet, Rfl: 1   ondansetron (ZOFRAN-ODT) 8 MG disintegrating tablet, Take 1 tablet (8 mg total) by mouth every 8 (eight) hours as needed for nausea or vomiting., Disp: 30 tablet, Rfl: 3   pantoprazole (PROTONIX) 40 MG tablet, TAKE 1 TABLET twice daily FOR STOMACH, Disp: 180 tablet, Rfl: 3   prochlorperazine (COMPAZINE) 10 MG tablet, Take 1 tablet (10 mg total) by mouth every 6 (six) hours as needed for nausea or vomiting., Disp: 30 tablet, Rfl: 1   traMADol (ULTRAM) 50 MG tablet, Take 1 tablet (50 mg total) by mouth every 6 (six) hours as needed for moderate pain or severe pain., Disp: 20 tablet, Rfl: 0   traZODone (DESYREL) 150 MG tablet, TAKE 1 OR 2 TABLETS AT BEDTIME FOR SLEEP, Disp: 180 tablet, Rfl: 3   TRUEplus Lancets 33G MISC, Test BS daily and as needed Dx E11.9, Disp: 100 each, Rfl: 3   valsartan (DIOVAN) 320 MG tablet, Take 1 tablet (320 mg total) by mouth daily. For blood pressure., Disp: 90 tablet, Rfl: 1   zinc gluconate 50 MG tablet, Take 50 mg by mouth daily., Disp: , Rfl:    Allergies: Allergies  Allergen Reactions   Penicillins Shortness Of Breath   Sulfa Antibiotics Rash    REVIEW OF SYSTEMS:   Review of Systems  Constitutional:  Negative  for chills, fatigue and fever.  HENT:   Negative for lump/mass, mouth sores, nosebleeds, sore throat and trouble swallowing.   Eyes:  Negative for eye problems.  Respiratory:  Negative for cough and shortness of breath.   Cardiovascular:  Negative for chest pain, leg swelling and palpitations.  Gastrointestinal:  Negative for abdominal pain, constipation, diarrhea, nausea and vomiting.  Genitourinary:  Negative for bladder incontinence, difficulty urinating, dysuria, frequency, hematuria and nocturia.   Musculoskeletal:  Negative for  arthralgias, back pain, flank pain, myalgias and neck pain.  Skin:  Negative for itching and rash.  Neurological:  Positive for headaches. Negative for dizziness and numbness.  Hematological:  Does not bruise/bleed easily.  Psychiatric/Behavioral:  Negative for depression, sleep disturbance and suicidal ideas. The patient is not nervous/anxious.   All other systems reviewed and are negative.    VITALS:   There were no vitals taken for this visit.  Wt Readings from Last 3 Encounters:  07/12/22 144 lb (65.3 kg)  07/04/22 143 lb (64.9 kg)  06/20/22 149 lb 9.6 oz (67.9 kg)    There is no height or weight on file to calculate BMI.  Performance status (ECOG): 1 - Symptomatic but completely ambulatory  PHYSICAL EXAM:   Physical Exam Vitals and nursing note reviewed. Exam conducted with a chaperone present.  Constitutional:      Appearance: Normal appearance.  Cardiovascular:     Rate and Rhythm: Normal rate and regular rhythm.     Pulses: Normal pulses.     Heart sounds: Normal heart sounds.  Pulmonary:     Effort: Pulmonary effort is normal.     Breath sounds: Normal breath sounds.  Abdominal:     Palpations: Abdomen is soft. There is no hepatomegaly, splenomegaly or mass.     Tenderness: There is no abdominal tenderness.  Musculoskeletal:     Right lower leg: No edema.     Left lower leg: No edema.  Lymphadenopathy:     Cervical: No cervical adenopathy.     Right cervical: No superficial, deep or posterior cervical adenopathy.    Left cervical: No superficial, deep or posterior cervical adenopathy.     Upper Body:     Right upper body: No supraclavicular or axillary adenopathy.     Left upper body: No supraclavicular or axillary adenopathy.  Neurological:     General: No focal deficit present.     Mental Status: She is alert and oriented to person, place, and time.  Psychiatric:        Mood and Affect: Mood normal.        Behavior: Behavior normal.     LABS:       Latest Ref Rng & Units 07/12/2022    8:16 AM 07/04/2022    9:11 AM 06/21/2022    9:58 AM  CBC  WBC 4.0 - 10.5 K/uL 8.8  6.8  4.1   Hemoglobin 12.0 - 15.0 g/dL 9.7  8.4  7.4   Hematocrit 36.0 - 46.0 % 30.3  25.9  21.9   Platelets 150 - 400 K/uL 384  149  66       Latest Ref Rng & Units 07/12/2022    8:16 AM 07/04/2022    9:11 AM 06/20/2022   12:43 PM  CMP  Glucose 70 - 99 mg/dL 139  125  114   BUN 8 - 23 mg/dL '16  14  22   '$ Creatinine 0.44 - 1.00 mg/dL 0.84  1.05  1.09   Sodium  135 - 145 mmol/L 136  135  135   Potassium 3.5 - 5.1 mmol/L 3.9  4.1  4.4   Chloride 98 - 111 mmol/L 101  100  100   CO2 22 - 32 mmol/L '25  27  25   '$ Calcium 8.9 - 10.3 mg/dL 8.7  8.4  9.1   Total Protein 6.5 - 8.1 g/dL 6.5  6.4  6.8   Total Bilirubin 0.3 - 1.2 mg/dL 0.6  1.1  0.8   Alkaline Phos 38 - 126 U/L 104  151  120   AST 15 - 41 U/L '29  24  28   '$ ALT 0 - 44 U/L 37  31  33      No results found for: "CEA1", "CEA" / No results found for: "CEA1", "CEA" No results found for: "PSA1" No results found for: "[id number]" No results found for: "CAN125"  No results found for: "TOTALPROTELP", "ALBUMINELP", "A1GS", "A2GS", "BETS", "BETA2SER", "GAMS", "MSPIKE", "SPEI" No results found for: "TIBC", "FERRITIN", "IRONPCTSAT" No results found for: "LDH"   STUDIES:   ECHOCARDIOGRAM COMPLETE  Result Date: 07/09/2022    ECHOCARDIOGRAM REPORT   Patient Name:   TAMEKKA GORES Date of Exam: 07/09/2022 Medical Rec #:  [id number]        Height:       63.0 in Accession #:    [id number]       Weight:       143.0 lb Date of Birth:  14-Jul-1960        BSA:          1.677 m Patient Age:    62 years         BP:           151/81 mmHg Patient Gender: F                HR:           80 bpm. Exam Location:  Outpatient Procedure: 2D Echo, 3D Echo, Cardiac Doppler, Color Doppler and Strain Analysis Indications:    chemo  History:        Patient has prior history of Echocardiogram examinations.                 Stroke; Risk Factors:Diabetes and  Hypertension.  Sonographer:    Phineas Douglas Referring Phys: [phone] [place]  1. Left ventricular ejection fraction, by estimation, is 60 to 65%. The left ventricle has normal function. The left ventricle has no regional wall motion abnormalities. Left ventricular diastolic parameters are consistent with Grade I diastolic dysfunction (impaired relaxation).  2. Right ventricular systolic function is normal. The right ventricular size is normal. Tricuspid regurgitation signal is inadequate for assessing PA pressure.  3. The mitral valve is normal in structure. No evidence of mitral valve regurgitation.  4. The aortic valve is tricuspid. Aortic valve regurgitation is not visualized. No aortic stenosis is present.  5. The inferior vena cava is normal in size with greater than 50% respiratory variability, suggesting right atrial pressure of 3 mmHg. Comparison(s): No prior Echocardiogram. FINDINGS  Left Ventricle: Left ventricular ejection fraction, by estimation, is 60 to 65%. The left ventricle has normal function. The left ventricle has no regional wall motion abnormalities. The global longitudinal strain is near normal despite suboptimal segment tracking (-17.8% but likely underestimated due to suboptimal tracking). 3D left ventricular ejection fraction analysis performed but not reported based on interpreter judgement due to suboptimal tracking. The left ventricular internal cavity size  was normal in size. There is no concentric left ventricular hypertrophy. Left ventricular diastolic parameters are consistent with Grade I diastolic dysfunction (impaired relaxation). Right Ventricle: The right ventricular size is normal. No increase in right ventricular wall thickness. Right ventricular systolic function is normal. Tricuspid regurgitation signal is inadequate for assessing PA pressure. Left Atrium: Left atrial size was normal in size. Right Atrium: Right atrial size was normal in size.  Pericardium: There is no evidence of pericardial effusion. Mitral Valve: The mitral valve is normal in structure. No evidence of mitral valve regurgitation. Tricuspid Valve: The tricuspid valve is normal in structure. Tricuspid valve regurgitation is not demonstrated. Aortic Valve: The aortic valve is tricuspid. Aortic valve regurgitation is not visualized. No aortic stenosis is present. Pulmonic Valve: The pulmonic valve was normal in structure. Pulmonic valve regurgitation is not visualized. Aorta: The aortic root and ascending aorta are structurally normal, with no evidence of dilitation. Venous: The inferior vena cava is normal in size with greater than 50% respiratory variability, suggesting right atrial pressure of 3 mmHg. IAS/Shunts: The atrial septum is grossly normal.  LEFT VENTRICLE PLAX 2D LVIDd:         3.80 cm     Diastology LVIDs:         2.40 cm     LV e' medial:    5.55 cm/s LV PW:         1.10 cm     LV E/e' medial:  13.0 LV IVS:        1.10 cm     LV e' lateral:   6.64 cm/s LVOT diam:     1.90 cm     LV E/e' lateral: 10.9 LV SV:         50 LV SV Index:   30          2D Longitudinal Strain LVOT Area:     2.84 cm    2D Strain GLS Avg:     -17.8 %  LV Volumes (MOD) LV vol d, MOD A2C: 83.9 ml 3D Volume EF: LV vol d, MOD A4C: 86.8 ml 3D EF:        55 % LV vol s, MOD A2C: 28.9 ml LV EDV:       121 ml LV vol s, MOD A4C: 32.5 ml LV ESV:       54 ml LV SV MOD A2C:     55.0 ml LV SV:        67 ml LV SV MOD A4C:     86.8 ml LV SV MOD BP:      53.4 ml RIGHT VENTRICLE             IVC RV Basal diam:  3.40 cm     IVC diam: 1.80 cm RV S prime:     14.00 cm/s TAPSE (M-mode): 1.7 cm LEFT ATRIUM             Index        RIGHT ATRIUM           Index LA diam:        3.30 cm 1.97 cm/m   RA Area:     11.70 cm LA Vol (A2C):   37.3 ml 22.25 ml/m  RA Volume:   22.10 ml  13.18 ml/m LA Vol (A4C):   27.2 ml 16.22 ml/m LA Biplane Vol: 33.2 ml 19.80 ml/m  AORTIC VALVE LVOT Vmax:   88.30 cm/s LVOT Vmean:  55.300 cm/s LVOT  VTI:    0.176 m  AORTA Ao Root diam: 2.70 cm Ao Asc diam:  3.20 cm MITRAL VALVE MV Area (PHT): 3.61 cm    SHUNTS MV Decel Time: 210 msec    Systemic VTI:  0.18 m MV E velocity: 72.20 cm/s  Systemic Diam: 1.90 cm MV A velocity: 88.10 cm/s MV E/A ratio:  0.82 Gwyndolyn Kaufman MD Electronically signed by Gwyndolyn Kaufman MD Signature Date/Time: 07/09/2022/10:52:49 AM    Final

## 2022-07-12 NOTE — Progress Notes (Signed)
Patient has been examined by Dr. Delton Coombes. Vital signs and labs have been reviewed by MD - ANC, Creatinine, LFTs, hemoglobin, and platelets are within treatment parameters per M.D. - pt may proceed with treatment.  Primary RN and pharmacy notified.

## 2022-07-12 NOTE — Patient Instructions (Addendum)
[place] at [facility] Discharge Instructions   You were seen and examined today by Dr. Delton Coombes.  He reviewed the results of your lab work which are normal/stable. You may cut your magnesium down to one pill twice a day as your magnesium is 2.1 today.   He reviewed the results of your echocardiogram which was normal.   We will proceed with your treatment today.   Return as scheduled.    Thank you for choosing [place] at [facility] to provide your oncology and hematology care.  To afford each patient quality time with our provider, please arrive at least 15 minutes before your scheduled appointment time.   If you have a lab appointment with the [facility] please come in thru the Main Entrance and check in at the main information desk.  You need to re-schedule your appointment should you arrive 10 or more minutes late.  We strive to give you quality time with our providers, and arriving late affects you and other patients whose appointments are after yours.  Also, if you no show three or more times for appointments you may be dismissed from the clinic at the providers discretion.     Again, thank you for choosing [facility].  Our hope is that these requests will decrease the amount of time that you wait before being seen by our physicians.       _____________________________________________________________  Should you have questions after your visit to [facility], please contact our office at [phone] and follow the prompts.  Our office hours are 8:00 a.m. and 4:30 p.m. Monday - Friday.  Please note that voicemails left after 4:00 p.m. may not be returned until the following business day.  We are closed weekends and major holidays.  You do have access to a nurse 24-7, just call the main number to the clinic [phone] and do not press any options, hold on the line and a nurse will answer the phone.     For prescription refill requests, have your pharmacy contact our office and allow 72 hours.    Due to Covid, you will need to wear a mask upon entering the hospital. If you do not have a mask, a mask will be given to you at the Main Entrance upon arrival. For doctor visits, patients may have 1 support person age 36 or older with them. For treatment visits, patients can not have anyone with them due to social distancing guidelines and our immunocompromised population.

## 2022-07-12 NOTE — Progress Notes (Signed)
Patient presents today for Keytruda/Doxorubicin/Cytoxan infusions per providers order.  Vital signs and labs reviewed by the MD.    Message received from Anastasio Champion RN/Dr. Delton Coombes patient okay to proceed with treatment.  Treatment given today per MD orders.  Stable during infusion without adverse affects.  Vital signs stable.  No complaints at this time.  Discharge from clinic ambulatory in stable condition.  Alert and oriented X 3.  Follow up with [facility] as scheduled.

## 2022-07-12 NOTE — Patient Instructions (Signed)
[facility]  Discharge Instructions: Thank you for choosing [facility] to provide your oncology and hematology care.  If you have a lab appointment with the [facility], please come in thru the Main Entrance and check in at the main information desk.  Wear comfortable clothing and clothing appropriate for easy access to any Portacath or PICC line.   We strive to give you quality time with your provider. You may need to reschedule your appointment if you arrive late (15 or more minutes).  Arriving late affects you and other patients whose appointments are after yours.  Also, if you miss three or more appointments without notifying the office, you may be dismissed from the clinic at the provider's discretion.      For prescription refill requests, have your pharmacy contact our office and allow 72 hours for refills to be completed.    Today you received the following chemotherapy and/or immunotherapy agents Doxorubicin/Cytoxan      To help prevent nausea and vomiting after your treatment, we encourage you to take your nausea medication as directed.  BELOW ARE SYMPTOMS THAT SHOULD BE REPORTED IMMEDIATELY: *FEVER GREATER THAN 100.4 F (38 C) OR HIGHER *CHILLS OR SWEATING *NAUSEA AND VOMITING THAT IS NOT CONTROLLED WITH YOUR NAUSEA MEDICATION *UNUSUAL SHORTNESS OF BREATH *UNUSUAL BRUISING OR BLEEDING *URINARY PROBLEMS (pain or burning when urinating, or frequent urination) *BOWEL PROBLEMS (unusual diarrhea, constipation, pain near the anus) TENDERNESS IN MOUTH AND THROAT WITH OR WITHOUT PRESENCE OF ULCERS (sore throat, sores in mouth, or a toothache) UNUSUAL RASH, SWELLING OR PAIN  UNUSUAL VAGINAL DISCHARGE OR ITCHING   Items with * indicate a potential emergency and should be followed up as soon as possible or go to the Emergency Department if any problems should occur.  Please show the CHEMOTHERAPY ALERT CARD or IMMUNOTHERAPY ALERT CARD at check-in to  the Emergency Department and triage nurse.  Should you have questions after your visit or need to cancel or reschedule your appointment, please contact [facility] [phone]  and follow the prompts.  Office hours are 8:00 a.m. to 4:30 p.m. Monday - Friday. Please note that voicemails left after 4:00 p.m. may not be returned until the following business day.  We are closed weekends and major holidays. You have access to a nurse at all times for urgent questions. Please call the main number to the clinic [phone] and follow the prompts.  For any non-urgent questions, you may also contact your provider using MyChart. We now offer e-Visits for anyone 74 and older to request care online for non-urgent symptoms. For details visit [email or web address].   Also download the MyChart app! Go to the app store, search "MyChart", open the app, select [facility], and log in with your MyChart username and password.

## 2022-07-13 ENCOUNTER — Inpatient Hospital Stay: Payer: No Typology Code available for payment source

## 2022-07-13 VITALS — BP 122/77 | HR 84 | Temp 97.8°F | Resp 18

## 2022-07-13 DIAGNOSIS — Z5112 Encounter for antineoplastic immunotherapy: Secondary | ICD-10-CM | POA: Diagnosis not present

## 2022-07-13 DIAGNOSIS — Z171 Estrogen receptor negative status [ER-]: Secondary | ICD-10-CM

## 2022-07-13 MED ORDER — PEGFILGRASTIM-CBQV 6 MG/0.6ML ~~LOC~~ SOSY
6.0000 mg | PREFILLED_SYRINGE | Freq: Once | SUBCUTANEOUS | Status: AC
  Administered 2022-07-13: 6 mg via SUBCUTANEOUS
  Filled 2022-07-13: qty 0.6

## 2022-07-13 NOTE — Progress Notes (Signed)
Patient tolerated injection with no complaints voiced. Site clean and dry with no bruising or swelling noted at site. See MAR for details. Band aid applied.  Patient stable during and after injection. VSS with discharge and left in satisfactory condition with no s/s of distress noted.  

## 2022-07-13 NOTE — Patient Instructions (Signed)
[facility] [place]  Discharge Instructions: Thank you for choosing [facility] to provide your oncology and hematology care.  If you have a lab appointment with the [facility], please come in thru the Main Entrance and check in at the main information desk.  Wear comfortable clothing and clothing appropriate for easy access to any Portacath or PICC line.   We strive to give you quality time with your provider. You may need to reschedule your appointment if you arrive late (15 or more minutes).  Arriving late affects you and other patients whose appointments are after yours.  Also, if you miss three or more appointments without notifying the office, you may be dismissed from the clinic at the provider's discretion.      For prescription refill requests, have your pharmacy contact our office and allow 72 hours for refills to be completed.    Today you received the following [place], return as scheduled.   To help prevent nausea and vomiting after your treatment, we encourage you to take your nausea medication as directed.  BELOW ARE SYMPTOMS THAT SHOULD BE REPORTED IMMEDIATELY: *FEVER GREATER THAN 100.4 F (38 C) OR HIGHER *CHILLS OR SWEATING *NAUSEA AND VOMITING THAT IS NOT CONTROLLED WITH YOUR NAUSEA MEDICATION *UNUSUAL SHORTNESS OF BREATH *UNUSUAL BRUISING OR BLEEDING *URINARY PROBLEMS (pain or burning when urinating, or frequent urination) *BOWEL PROBLEMS (unusual diarrhea, constipation, pain near the anus) TENDERNESS IN MOUTH AND THROAT WITH OR WITHOUT PRESENCE OF ULCERS (sore throat, sores in mouth, or a toothache) UNUSUAL RASH, SWELLING OR PAIN  UNUSUAL VAGINAL DISCHARGE OR ITCHING   Items with * indicate a potential emergency and should be followed up as soon as possible or go to the Emergency Department if any problems should occur.  Please show the CHEMOTHERAPY ALERT CARD or IMMUNOTHERAPY ALERT CARD at check-in to the Emergency Department and triage  nurse.  Should you have questions after your visit or need to cancel or reschedule your appointment, please contact [facility] [phone]  and follow the prompts.  Office hours are 8:00 a.m. to 4:30 p.m. Monday - Friday. Please note that voicemails left after 4:00 p.m. may not be returned until the following business day.  We are closed weekends and major holidays. You have access to a nurse at all times for urgent questions. Please call the main number to the clinic [phone] and follow the prompts.  For any non-urgent questions, you may also contact your provider using MyChart. We now offer e-Visits for anyone 85 and older to request care online for non-urgent symptoms. For details visit [email or web address].   Also download the MyChart app! Go to the app store, search "MyChart", open the app, select [facility], and log in with your MyChart username and password.

## 2022-07-13 NOTE — Progress Notes (Signed)
Patient accessed for 24-hr follow-up patient reports feeling good, she states she feels a little hyper from the steroids but otherwise okay. Patient notified to call cancer center if additional symptoms occur.

## 2022-07-16 ENCOUNTER — Telehealth: Payer: Self-pay | Admitting: *Deleted

## 2022-07-16 NOTE — Telephone Encounter (Signed)
Patient called to advise that she has been unable to keep fluids down since last treatment, despite the use of zofran and Compazine.  She will come in tomorrow for labs and fluids.

## 2022-07-17 ENCOUNTER — Inpatient Hospital Stay: Payer: No Typology Code available for payment source

## 2022-07-17 ENCOUNTER — Other Ambulatory Visit: Payer: Self-pay | Admitting: *Deleted

## 2022-07-17 VITALS — BP 129/89 | HR 88 | Temp 97.5°F | Resp 16

## 2022-07-17 VITALS — BP 120/81 | HR 100 | Temp 97.0°F | Resp 18

## 2022-07-17 DIAGNOSIS — D702 Other drug-induced agranulocytosis: Secondary | ICD-10-CM

## 2022-07-17 DIAGNOSIS — R112 Nausea with vomiting, unspecified: Secondary | ICD-10-CM

## 2022-07-17 DIAGNOSIS — Z171 Estrogen receptor negative status [ER-]: Secondary | ICD-10-CM

## 2022-07-17 DIAGNOSIS — Z95828 Presence of other vascular implants and grafts: Secondary | ICD-10-CM

## 2022-07-17 DIAGNOSIS — N6313 Unspecified lump in the right breast, lower outer quadrant: Secondary | ICD-10-CM

## 2022-07-17 DIAGNOSIS — Z5112 Encounter for antineoplastic immunotherapy: Secondary | ICD-10-CM | POA: Diagnosis not present

## 2022-07-17 LAB — CBC WITH DIFFERENTIAL/PLATELET
Abs Immature Granulocytes: 0.1 10*3/uL — ABNORMAL HIGH (ref 0.00–0.07)
Basophils Absolute: 0 10*3/uL (ref 0.0–0.1)
Basophils Relative: 0 %
Eosinophils Absolute: 0 10*3/uL (ref 0.0–0.5)
Eosinophils Relative: 0 %
HCT: 29.9 % — ABNORMAL LOW (ref 36.0–46.0)
Hemoglobin: 9.7 g/dL — ABNORMAL LOW (ref 12.0–15.0)
Lymphocytes Relative: 10 %
Lymphs Abs: 1 10*3/uL (ref 0.7–4.0)
MCH: 32.7 pg (ref 26.0–34.0)
MCHC: 32.4 g/dL (ref 30.0–36.0)
MCV: 100.7 fL — ABNORMAL HIGH (ref 80.0–100.0)
Metamyelocytes Relative: 1 %
Monocytes Absolute: 0.3 10*3/uL (ref 0.1–1.0)
Monocytes Relative: 3 %
Neutro Abs: 8.3 10*3/uL — ABNORMAL HIGH (ref 1.7–7.7)
Neutrophils Relative %: 86 %
Platelets: 156 10*3/uL (ref 150–400)
RBC: 2.97 MIL/uL — ABNORMAL LOW (ref 3.87–5.11)
RDW: 23.6 % — ABNORMAL HIGH (ref 11.5–15.5)
WBC: 9.7 10*3/uL (ref 4.0–10.5)
nRBC: 0 % (ref 0.0–0.2)

## 2022-07-17 LAB — COMPREHENSIVE METABOLIC PANEL
ALT: 16 U/L (ref 0–44)
AST: 19 U/L (ref 15–41)
Albumin: 3.5 g/dL (ref 3.5–5.0)
Alkaline Phosphatase: 168 U/L — ABNORMAL HIGH (ref 38–126)
Anion gap: 10 (ref 5–15)
BUN: 15 mg/dL (ref 8–23)
CO2: 26 mmol/L (ref 22–32)
Calcium: 8.9 mg/dL (ref 8.9–10.3)
Chloride: 101 mmol/L (ref 98–111)
Creatinine, Ser: 0.82 mg/dL (ref 0.44–1.00)
GFR, Estimated: >60 mL/min (ref >60)
Glucose, Bld: 155 mg/dL — ABNORMAL HIGH (ref 70–99)
Potassium: 3.8 mmol/L (ref 3.5–5.1)
Sodium: 137 mmol/L (ref 135–145)
Total Bilirubin: 0.8 mg/dL (ref 0.3–1.2)
Total Protein: 6.6 g/dL (ref 6.5–8.1)

## 2022-07-17 LAB — MAGNESIUM: Magnesium: 1.6 mg/dL — ABNORMAL LOW (ref 1.7–2.4)

## 2022-07-17 LAB — SAMPLE TO BLOOD BANK

## 2022-07-17 MED ORDER — SODIUM CHLORIDE 0.9% FLUSH
10.0000 mL | Freq: Once | INTRAVENOUS | Status: AC
  Administered 2022-07-17: 10 mL via INTRAVENOUS

## 2022-07-17 MED ORDER — SODIUM CHLORIDE 0.9% FLUSH
10.0000 mL | Freq: Once | INTRAVENOUS | Status: AC | PRN
  Administered 2022-07-17: 10 mL

## 2022-07-17 MED ORDER — ALUMINUM-MAGNESIUM-SIMETHICONE 200-200-20 MG/5ML PO SUSP: 30.0000 mL | Freq: Three times a day (TID) | ORAL | 2 refills | Status: DC

## 2022-07-17 MED ORDER — MAGNESIUM SULFATE 2 GM/50ML IV SOLN
2.0000 g | Freq: Once | INTRAVENOUS | Status: AC
  Administered 2022-07-17: 2 g via INTRAVENOUS
  Filled 2022-07-17: qty 50

## 2022-07-17 MED ORDER — SODIUM CHLORIDE 0.9 % IV SOLN: 8.0000 mg | Freq: Once | INTRAVENOUS | Status: DC

## 2022-07-17 MED ORDER — HEPARIN SOD (PORK) LOCK FLUSH 100 UNIT/ML IV SOLN
500.0000 [IU] | Freq: Once | INTRAVENOUS | Status: AC | PRN
  Administered 2022-07-17: 500 [IU]

## 2022-07-17 MED ORDER — ONDANSETRON HCL 4 MG/2ML IJ SOLN
8.0000 mg | Freq: Once | INTRAMUSCULAR | Status: AC
  Administered 2022-07-17: 8 mg via INTRAVENOUS
  Filled 2022-07-17: qty 4

## 2022-07-17 MED ORDER — OLANZAPINE 5 MG PO TABS: 5.0000 mg | ORAL_TABLET | Freq: Every day | ORAL | 2 refills | Status: DC

## 2022-07-17 MED ORDER — POTASSIUM CHLORIDE IN NACL 20-0.9 MEQ/L-% IV SOLN
Freq: Once | INTRAVENOUS | Status: AC
  Filled 2022-07-17: qty 1000

## 2022-07-17 NOTE — Patient Instructions (Signed)
[facility]  Discharge Instructions: Thank you for choosing [facility] to provide your oncology and hematology care.  If you have a lab appointment with the [facility], please come in thru the Main Entrance and check in at the main information desk.  Wear comfortable clothing and clothing appropriate for easy access to any Portacath or PICC line.   We strive to give you quality time with your provider. You may need to reschedule your appointment if you arrive late (15 or more minutes).  Arriving late affects you and other patients whose appointments are after yours.  Also, if you miss three or more appointments without notifying the office, you may be dismissed from the clinic at the provider's discretion.      For prescription refill requests, have your pharmacy contact our office and allow 72 hours for refills to be completed.    Today you received the following house fluids and zofran. On day of treatment start taking Compazine every 6hrs around the clock until 3rd day of treatment, then you can take Zofran as prescribed as needed for nausea. Take Zyprexa at night for nausea. If nausea has not improved please call cancer center. For diarrhea take 2 imodium after 1st loose stool then take 1 capsule after each additional loose stool, do not exceed 8 capsules in 24hrs. Return as scheduled.    To help prevent nausea and vomiting after your treatment, we encourage you to take your nausea medication as directed.  BELOW ARE SYMPTOMS THAT SHOULD BE REPORTED IMMEDIATELY: *FEVER GREATER THAN 100.4 F (38 C) OR HIGHER *CHILLS OR SWEATING *NAUSEA AND VOMITING THAT IS NOT CONTROLLED WITH YOUR NAUSEA MEDICATION *UNUSUAL SHORTNESS OF BREATH *UNUSUAL BRUISING OR BLEEDING *URINARY PROBLEMS (pain or burning when urinating, or frequent urination) *BOWEL PROBLEMS (unusual diarrhea, constipation, pain near the anus) TENDERNESS IN MOUTH AND THROAT WITH OR WITHOUT PRESENCE  OF ULCERS (sore throat, sores in mouth, or a toothache) UNUSUAL RASH, SWELLING OR PAIN  UNUSUAL VAGINAL DISCHARGE OR ITCHING   Items with * indicate a potential emergency and should be followed up as soon as possible or go to the Emergency Department if any problems should occur.  Please show the CHEMOTHERAPY ALERT CARD or IMMUNOTHERAPY ALERT CARD at check-in to the Emergency Department and triage nurse.  Should you have questions after your visit or need to cancel or reschedule your appointment, please contact [facility] [phone]  and follow the prompts.  Office hours are 8:00 a.m. to 4:30 p.m. Monday - Friday. Please note that voicemails left after 4:00 p.m. may not be returned until the following business day.  We are closed weekends and major holidays. You have access to a nurse at all times for urgent questions. Please call the main number to the clinic [phone] and follow the prompts.  For any non-urgent questions, you may also contact your provider using MyChart. We now offer e-Visits for anyone 32 and older to request care online for non-urgent symptoms. For details visit [email or web address].   Also download the MyChart app! Go to the app store, search "MyChart", open the app, select [facility], and log in with your MyChart username and password.

## 2022-07-17 NOTE — Progress Notes (Signed)
Patient presents today for possible fluids. She has been unable to keep many fluids down over the weekend, reports nausea and vomiting, some diarrhea. Mag is 1.6 other labs are within normal limits. Patient reports mouth feels sore when trying to eat, no visible sores noted, Dr. Delton Coombes made aware. Per MD give house fluids over 2hrs & '8mg'$  of Zofran. Maalox and Zyprexa sent into patient's pharmacy.  Patient tolerated therapy with no complaints voiced. Side effects with management reviewed with understanding verbalized. Port site clean and dry with no bruising or swelling noted at site. Good blood return noted before and after administration of therapy. Band aid applied. Patient left in satisfactory condition with VSS and no s/s of distress noted.

## 2022-07-18 ENCOUNTER — Other Ambulatory Visit: Payer: Self-pay

## 2022-07-18 ENCOUNTER — Telehealth: Payer: Self-pay | Admitting: Family Medicine

## 2022-07-18 DIAGNOSIS — N6313 Unspecified lump in the right breast, lower outer quadrant: Secondary | ICD-10-CM

## 2022-07-18 NOTE — Telephone Encounter (Signed)
Have her keep an eye on her pressures and let me know if they trend over 160/90

## 2022-07-18 NOTE — Telephone Encounter (Signed)
Called patient, no answer 

## 2022-07-19 NOTE — Telephone Encounter (Signed)
LMTCB JBB 3/14,

## 2022-07-19 NOTE — Telephone Encounter (Signed)
Called patient, no answer, left message to return call 

## 2022-07-19 NOTE — Telephone Encounter (Signed)
Rec'd patients pages for [facility].  Faxed pcp pages (for signature) to [facility] 07/17/22.

## 2022-07-22 NOTE — Progress Notes (Signed)
[facility] [address]  [address]    Clinic Day:  07/23/2022  Referring physician: Claretta Fraise, MD  Patient Care Team: Claretta Fraise, MD as PCP - General (Family Medicine) Claretta Fraise, MD (Family Medicine) Lavera Guise, [facility] as Pharmacist (Family Medicine) Montez Morita, Quillian Quince, MD as Consulting Physician (Gastroenterology) Aviva Signs, MD as Consulting Physician (General Surgery) Mauro Kaufmann, RN as Oncology Nurse Navigator Rockwell Germany, RN as Oncology Nurse Navigator Derek Jack, MD as Medical Oncologist (Medical Oncology) Brien Mates, RN as Oncology Nurse Navigator (Medical Oncology) Derek Jack, MD as Consulting Physician (Hematology) Claretta Fraise, MD as Referring Physician (Family Medicine)   ASSESSMENT & PLAN:   Assessment: 1.  Inflammatory right breast cancer: - Bilateral diagnostic mammogram (02/20/2022): Suspicious right axillary lymphadenopathy.  Indeterminate intramammary lymph node in the right breast at 9:00.  New right breast skin and trabecular thickening, related to vascular congestion from enlarged lymph nodes in the right axilla. - Right axillary lymph node core biopsy (02/20/2022): Morphology and IHC compatible with primary breast cancer with differential diagnosis of urothelial carcinoma and primary lung carcinoma (squamous).  Grade 3.  ER 0%, PR 0%, HER2 2+, Ki-67 60%, HER2 negative by FISH. - Right breast lymph node biopsy at 9:00 (02/20/2022): Negative for carcinoma. - MRI breast (03/02/2022): In the upper outer right breast, mid to posterior depth there is a patchy clumped non-mass enhancement in a linear orientation spanning approximately 3.8 cm.  There is diffuse thickening of the skin in the right breast with skin enhancement.  Left breast with no mass or abnormal enhancement.  Numerous bulky matted lymph nodes in the right axilla. - Right breast UOQ biopsy (03/19/2022): Benign  breast with fibrocystic changes including stromal fibrosis, adenosis, usual ductal hyperplasia.  Negative for carcinoma. - PET scan (03/22/2022): Bulky hypermetabolic right axillary and subpectoral lymphadenopathy.  No other definite new sites of metastatic disease.  6 mm right lower lobe lung nodule with no FDG uptake.  Plan: 1.  Triple negative right breast cancer: - Cycle 1 of AC and pembrolizumab on 07/12/2022. - She had some mucositis but denied any GI symptoms. - Continue mouthwash with lidocaine prior to eating. - Reviewed labs today which showed normal LFTs and CBC was grossly normal. - RTC on 08/01/2022 to initiate cycle 2.   2.  Hypomagnesemia: - Continue magnesium 1 tablet twice daily.  Magnesium today normal at 1.8.  No orders of the defined types were placed in this encounter.     I,Alexis Herring,acting as a Education administrator for [employer], MD.,have documented all relevant documentation on the behalf of Derek Jack, MD,as directed by  Derek Jack, MD while in the presence of Derek Jack, MD.   I, Derek Jack MD, have reviewed the above documentation for accuracy and completeness, and I agree with the above.    Derek Jack, MD   3/18/20246:04 PM  CHIEF COMPLAINT:   Diagnosis: locally advanced TNBC    Cancer Staging  Malignant neoplasm of overlapping sites of right female breast [facility]) Staging form: Breast, AJCC 8th Edition - Clinical stage from 03/08/2022: Stage IIIC (cT4d, cN2, cM0, G3, ER-, PR-, HER2-) - Signed by Truitt Merle, MD on 03/08/2022    Prior Therapy: None  Current Therapy:  Pembrolizumab, carboplatin and paclitaxel followed by pembrolizumab, Adriamycin and Cytoxan    HISTORY OF PRESENT ILLNESS:   Oncology History Overview Note   Cancer Staging  Malignant neoplasm of overlapping sites of right female breast ([place])  Staging form: Breast, AJCC 8th Edition - Clinical stage from 03/08/2022: Stage IIIC (cT2, cN2, cM0,  G3, ER-, PR-, HER2-) - Signed by Truitt Merle, MD on 03/08/2022    Malignant neoplasm of overlapping sites of right female breast [facility])  02/20/2022 Mammogram   CLINICAL DATA:  62 year old female recalled from screening mammography 03/01/2021 for right breast calcifications and subsequent benign discordant biopsy of these calcifications in the central posterior right breast December 2022 with excision recommended. The patient initially followed up with surgery in April however canceled her scheduled surgery and most recently followed up with Dr. [name] September 2023 with diagnostic imaging, possible RF tag placement and subsequent excision recommended.   EXAM: DIGITAL DIAGNOSTIC BILATERAL MAMMOGRAM WITH TOMOSYNTHESIS; ULTRASOUND RIGHT BREAST LIMITED  MPRESSION: 1.  Suspicious right axillary lymphadenopathy.   2. Indeterminate intramammary lymph node in the right breast at 9 o'clock.   3. 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   4. Decreased calcifications noted at prior benign biopsy site in the lower central posterior right breast at site of X shaped biopsy marking clip.   02/20/2022 Initial Biopsy   FINAL MICROSCOPIC DIAGNOSIS:   A. AXILLA, RIGHT, LYMPH NODE, NEEDLE CORE BIOPSY:  - Positive for carcinoma (see Comment)   B. LYMPH NODE, RIGHT BREAST, BIOPSY:  - Negative for carcinoma   COMMENT:  Part A: Morphology and immunohistochemical staining are most compatible with primary breast carcinoma with metaplastic changes, however differential diagnosis also includes urothelial carcinoma and less likely primary lung carcinoma (squamous).  No lymphoid tissue is identified.  Clinical and radiologic correlation is suggested.   ADDENDUM:  In case of a breast origin, the appropriate grade would be grade 3  (3+3+2)   ADDENDUM:   PROGNOSTIC INDICATOR RESULTS:  The tumor cells are EQUIVOCAL for Her2 (2+).  Estrogen Receptor:       0%, NEGATIVE  Progesterone Receptor:   0%, NEGATIVE  Proliferation Marker Ki-67:   60%   ADDENDUM:  FLOURESCENCE IN-SITU HYBRIDIZATION RESULTS:  GROUP 5:   HER2 **NEGATIVE**    03/02/2022 Imaging   EXAM: BILATERAL BREAST MRI WITH AND WITHOUT CONTRAST  IMPRESSION: 1. There is a suspicious 3.8 cm area of patchy non mass enhancement in a linear orientation in the slightly upper outer right breast in the mid to posterior depth spanning 3.8 cm.   2. Diffuse skin thickening with enhancement of the skin, concerning for inflammatory breast cancer.   3. Numerous bulky matted lymph nodes in the right axilla, one of which corresponds with the biopsy-proven metastatic lymph node.   4.  No evidence of left breast malignancy.   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 ([place])   03/08/2022 Cancer Staging   Staging form: Breast, AJCC 8th Edition - Clinical stage from 03/08/2022: Stage IIIC (cT4d, cN2, cM0, G3, ER-, PR-, HER2-) - Signed by Truitt Merle, MD on 03/08/2022 Histologic grading system: 3 grade system   03/19/2022 Pathology Results   Diagnosis Breast, right, needle core biopsy, upper outer quadrant, barbell clip BENIGN BREAST WITH FIBROCYSTIC CHANGES INCLUDING STROMAL FIBROSIS, ADENOSIS AND USUAL DUCT HYPERPLASIA BENIGN FIBROMATOID CHANGE NEGATIVE FOR MICROCALCIFICATIONS NEGATIVE FOR CARCINOMA   03/22/2022 PET scan   IMPRESSION: Bulky hypermetabolic right axillary and subpectoral lymphadenopathy, consistent with metastatic disease.   No other definite sites of metastatic disease identified.   6 mm right lower  lobe pulmonary nodule shows no FDG uptake, but is too small to definitively characterize by PET. Recommend continued follow-up by chest CT in 3-4 months.   Aortic Atherosclerosis (ICD10-I70.0).   04/02/2022 -   Chemotherapy   Patient is on Treatment Plan : BREAST Pembrolizumab (200) D1 + Carboplatin (5) D1 + Paclitaxel (80) D1,8,15 q21d X 4 cycles / Pembrolizumab (200) D1 + AC D1 q21d x 4 cycles      Genetic Testing   Negative genetic testing. No pathogenic variants identified on the [facility] Common Hereditary Cancers+RNA panel. The report date is 05/31/2022.  The Common Hereditary Cancers Panel + RNA offered by [facility] includes sequencing and/or deletion duplication testing of the following 48 genes: APC*, ATM*, AXIN2, BAP1, BARD1, BMPR1A, BRCA1, BRCA2, BRIP1, CDH1, CDK4, CDKN2A (p14ARF), CDKN2A (p16INK4a), CHEK2, CTNNA1, DICER1*, EPCAM*, FH*, GREM1*, HOXB13, KIT, MBD4, MEN1*, MLH1*, MSH2*, MSH3*, MSH6*, MUTYH, NF1*, NTHL1, PALB2, PDGFRA, PMS2*, POLD1*, POLE, PTEN*, RAD51C, RAD51D, SDHA*, SDHB, SDHC*, SDHD, SMAD4, SMARCA4, STK11, TP53, TSC1*, TSC2, VHL.       INTERVAL HISTORY:   Marilyn Ford is a 62 y.o. female presenting to clinic today for follow up of locally advanced TNBC. She was last seen by me on 07/12/22.  Today, she states that she is doing well overall. Her appetite level is at 75%. Her energy level is at 75%.    PAST MEDICAL HISTORY:   Past Medical History: Past Medical History:  Diagnosis Date   Allergy    Anxiety    Breast cancer ([place])    Diabetes mellitus without complication ([place])    GERD (gastroesophageal reflux disease)    Heart murmur    as a child   History of kidney stones    Hypertension    Pneumonia    Stroke [facility])    Vaginal Pap smear, abnormal     Surgical History: Past Surgical History:  Procedure Laterality Date   CESAREAN SECTION     CHOLECYSTECTOMY     COLONOSCOPY WITH PROPOFOL N/A 07/19/2020   Procedure: COLONOSCOPY WITH PROPOFOL;  Surgeon: Harvel Quale, MD;  Location: [facility];  Service: Gastroenterology;  Laterality: N/A;  AM   POLYPECTOMY  07/19/2020   Procedure: POLYPECTOMY;  Surgeon: Montez Morita, Quillian Quince, MD;  Location: [facility];   Service: Gastroenterology;;   PORTACATH PLACEMENT N/A 03/20/2022   Procedure: INSERTION PORT-A-CATH;  Surgeon: Coralie Keens, MD;  Location: [facility] ORS;  Service: General;  Laterality: N/A;    Social History: Social History   Socioeconomic History   Marital status: Married    Spouse name: Jeneen Rinks   Number of children: 2   Years of education: 10   Highest education level: 10th grade  Occupational History   Occupation: disabled  Tobacco Use   Smoking status: Former    Packs/day: 0.50    Years: 25.00    Additional pack years: 0.00    Total pack years: 12.50    Types: Cigarettes    Start date: 12/08/1999    Quit date: 03/12/2015    Years since quitting: 7.3   Smokeless tobacco: Never  Vaping Use   Vaping Use: Never used  Substance and Sexual Activity   Alcohol use: Yes    Alcohol/week: 2.0 - 3.0 standard drinks of alcohol    Types: 2 - 3 Glasses of wine per week   Drug use: No   Sexual activity: Not Currently    Birth control/protection: Abstinence, Post-menopausal  Other Topics Concern   Not on file  Social History Narrative  Volunteers at [employer] for a few hours every day.    She really enjoys getting out of of the house and working there.    Social Determinants of Health   Financial Resource Strain: Low Risk  (03/01/2022)   Overall Financial Resource Strain (CARDIA)    Difficulty of Paying Living Expenses: Not very hard  Food Insecurity: No Food Insecurity (04/09/2022)   Hunger Vital Sign    Worried About Running Out of Food in the Last Year: Never true    Ran Out of Food in the Last Year: Never true  Transportation Needs: No Transportation Needs (03/01/2022)   PRAPARE - Hydrologist (Medical): No    Lack of Transportation (Non-Medical): No  Physical Activity: Insufficiently Active (03/01/2022)   Exercise Vital Sign    Days of Exercise per Week: 1 day    Minutes of Exercise per Session: 10 min  Stress: Stress Concern  Present (03/01/2022)   [address]    Feeling of Stress : Rather much  Social Connections: Moderately Isolated (03/01/2022)   Social Connection and Isolation Panel [NHANES]    Frequency of Communication with Friends and Family: Twice a week    Frequency of Social Gatherings with Friends and Family: Twice a week    Attends Religious Services: Never    Marine scientist or Organizations: No    Attends Archivist Meetings: Never    Marital Status: Married  Human resources officer Violence: Not At Risk (04/09/2022)   Humiliation, Afraid, Rape, and Kick questionnaire    Fear of Current or Ex-Partner: No    Emotionally Abused: No    Physically Abused: No    Sexually Abused: No    Family History: Family History  Problem Relation Age of Onset   Diabetes Mother    Uterine cancer Mother 81 - 28   Diabetes Brother    Breast cancer Maternal Aunt        dx >50, d. from cancer   Cancer Maternal Grandmother        unk type, "back cancer?"    Current Medications:  Current Outpatient Medications:    Alcohol Swabs (B-D SINGLE USE SWABS REGULAR) PADS, Test BS daily and as needed Dx E11.9, Disp: 100 each, Rfl: 3   aluminum-magnesium hydroxide-simethicone (MAALOX) 200-200-20 MG/5ML SUSP, Take 30 mLs by mouth 4 (four) times daily -  before meals and at bedtime., Disp: 480 mL, Rfl: 2   amLODipine (NORVASC) 10 MG tablet, 1 tablet daily, Disp: 90 tablet, Rfl: 3   Apple Cider Vinegar 500 MG TABS, Take 500 mg by mouth in the morning., Disp: , Rfl:    aspirin 81 MG EC tablet, Take 1 tablet (81 mg total) by mouth daily., Disp: 30 tablet, Rfl: 0   atorvastatin (LIPITOR) 80 MG tablet, TAKE 1 TABLET EVERY DAY AT 6PM, Disp: 90 tablet, Rfl: 3   Blood Glucose Calibration (TRUE METRIX LEVEL 1) Low SOLN, Use with glucometer Dx E11.9, Disp: 3 each, Rfl: 0   Blood Glucose Monitoring Suppl (TRUE METRIX AIR GLUCOSE METER) w/Device KIT, Test BS  daily and as needed Dx E11.9, Disp: 1 kit, Rfl: 0   Calcium Carb-Cholecalciferol (CALCIUM-VITAMIN D) 600-400 MG-UNIT TABS, Take 1 tablet by mouth in the morning., Disp: , Rfl:    Coenzyme Q10 (COQ10) 100 MG CAPS, Take 100 mg by mouth in the morning., Disp: , Rfl:    Cranberry 425 MG CAPS, Take  425 mg by mouth in the morning and at bedtime., Disp: , Rfl:    Dulaglutide (TRULICITY) 4.5 0000000 SOPN, Inject 4.5 mg as directed once a week., Disp: 6 mL, Rfl: 1   Flaxseed, Linseed, (FLAXSEED OIL) 1000 MG CAPS, Take 1,000 mg by mouth in the morning., Disp: , Rfl:    gabapentin (NEURONTIN) 300 MG capsule, Take 1 capsule (300 mg total) by mouth 3 (three) times daily., Disp: 90 capsule, Rfl: 0   glucose blood (TRUE METRIX BLOOD GLUCOSE TEST) test strip, Test BS daily and as needed Dx E11.9, Disp: 100 each, Rfl: 3   icosapent Ethyl (VASCEPA) 1 g capsule, Take 2 g by mouth 2 (two) times daily., Disp: , Rfl:    Krill Oil 500 MG CAPS, Take 3 capsules (1,500 mg total) by mouth in the morning and at bedtime., Disp: , Rfl:    lidocaine-prilocaine (EMLA) cream, Apply to affected area once, Disp: 30 g, Rfl: 3   LORazepam (ATIVAN) 1 MG tablet, Take 1 mg by mouth every 8 (eight) hours as needed., Disp: , Rfl:    magnesium oxide (MAG-OX) 400 (240 Mg) MG tablet, TAKE 1 TABLET BY MOUTH TWICE A DAY, Disp: 60 tablet, Rfl: 1   meloxicam (MOBIC) 15 MG tablet, Take 15 mg by mouth daily., Disp: , Rfl:    metoprolol (TOPROL-XL) 200 MG 24 hr tablet, TAKE 1 TABLET ONE TIME DAILY, WITH OR IMMEDIATELY FOLLOWING A MEAL, Disp: 90 tablet, Rfl: 3   Multiple Vitamin (MULTIVITAMIN) capsule, Take 1 capsule by mouth in the morning., Disp: , Rfl:    OLANZapine (ZYPREXA) 5 MG tablet, Take 1 tablet (5 mg total) by mouth at bedtime., Disp: 30 tablet, Rfl: 2   ondansetron (ZOFRAN) 8 MG tablet, Take 1 tablet (8 mg total) by mouth every 8 (eight) hours as needed for nausea or vomiting. Start on the third day after chemotherapy., Disp: 30 tablet,  Rfl: 1   ondansetron (ZOFRAN-ODT) 8 MG disintegrating tablet, Take 1 tablet (8 mg total) by mouth every 8 (eight) hours as needed for nausea or vomiting., Disp: 30 tablet, Rfl: 3   pantoprazole (PROTONIX) 40 MG tablet, TAKE 1 TABLET twice daily FOR STOMACH, Disp: 180 tablet, Rfl: 3   prochlorperazine (COMPAZINE) 10 MG tablet, Take 1 tablet (10 mg total) by mouth every 6 (six) hours as needed for nausea or vomiting., Disp: 30 tablet, Rfl: 1   traMADol (ULTRAM) 50 MG tablet, Take 1 tablet (50 mg total) by mouth every 6 (six) hours as needed for moderate pain or severe pain., Disp: 20 tablet, Rfl: 0   traZODone (DESYREL) 150 MG tablet, TAKE 1 OR 2 TABLETS AT BEDTIME FOR SLEEP, Disp: 180 tablet, Rfl: 3   TRUEplus Lancets 33G MISC, Test BS daily and as needed Dx E11.9, Disp: 100 each, Rfl: 3   valsartan (DIOVAN) 320 MG tablet, Take 1 tablet (320 mg total) by mouth daily. For blood pressure., Disp: 90 tablet, Rfl: 1   zinc gluconate 50 MG tablet, Take 50 mg by mouth daily., Disp: , Rfl:    Allergies: Allergies  Allergen Reactions   Penicillins Shortness Of Breath   Sulfa Antibiotics Rash    REVIEW OF SYSTEMS:   Review of Systems  Constitutional:  Negative for chills, fatigue and fever.  HENT:   Positive for mouth sores. Negative for lump/mass, nosebleeds, sore throat and trouble swallowing.   Eyes:  Negative for eye problems.  Respiratory:  Negative for cough and shortness of breath.   Cardiovascular:  Negative for chest pain, leg swelling and palpitations.  Gastrointestinal:  Negative for abdominal pain, constipation, diarrhea, nausea and vomiting.  Genitourinary:  Negative for bladder incontinence, difficulty urinating, dysuria, frequency, hematuria and nocturia.   Musculoskeletal:  Negative for arthralgias, back pain, flank pain, myalgias and neck pain.  Skin:  Negative for itching and rash.  Neurological:  Negative for dizziness, headaches and numbness.  Hematological:  Does not  bruise/bleed easily.  Psychiatric/Behavioral:  Negative for depression, sleep disturbance and suicidal ideas. The patient is not nervous/anxious.   All other systems reviewed and are negative.    VITALS:   There were no vitals taken for this visit.  Wt Readings from Last 3 Encounters:  07/23/22 135 lb 6.4 oz (61.4 kg)  07/12/22 144 lb (65.3 kg)  07/04/22 143 lb (64.9 kg)    There is no height or weight on file to calculate BMI.  Performance status (ECOG): 1 - Symptomatic but completely ambulatory  PHYSICAL EXAM:   Physical Exam Vitals and nursing note reviewed. Exam conducted with a chaperone present.  Constitutional:      Appearance: Normal appearance.  Cardiovascular:     Rate and Rhythm: Normal rate and regular rhythm.     Pulses: Normal pulses.     Heart sounds: Normal heart sounds.  Pulmonary:     Effort: Pulmonary effort is normal.     Breath sounds: Normal breath sounds.  Abdominal:     Palpations: Abdomen is soft. There is no hepatomegaly, splenomegaly or mass.     Tenderness: There is no abdominal tenderness.  Musculoskeletal:     Right lower leg: No edema.     Left lower leg: No edema.  Lymphadenopathy:     Cervical: No cervical adenopathy.     Right cervical: No superficial, deep or posterior cervical adenopathy.    Left cervical: No superficial, deep or posterior cervical adenopathy.     Upper Body:     Right upper body: No supraclavicular or axillary adenopathy.     Left upper body: No supraclavicular or axillary adenopathy.  Neurological:     General: No focal deficit present.     Mental Status: She is alert and oriented to person, place, and time.  Psychiatric:        Mood and Affect: Mood normal.        Behavior: Behavior normal.     LABS:      Latest Ref Rng & Units 07/23/2022    9:45 AM 07/17/2022   10:40 AM 07/12/2022    8:16 AM  CBC  WBC 4.0 - 10.5 K/uL 4.2  9.7  8.8   Hemoglobin 12.0 - 15.0 g/dL 8.3  9.7  9.7   Hematocrit 36.0 - 46.0 %  25.8  29.9  30.3   Platelets 150 - 400 K/uL 119  156  384       Latest Ref Rng & Units 07/23/2022    9:45 AM 07/17/2022   10:40 AM 07/12/2022    8:16 AM  CMP  Glucose 70 - 99 mg/dL 119  155  139   BUN 8 - 23 mg/dL 8  15  16    Creatinine 0.44 - 1.00 mg/dL 0.91  0.82  0.84   Sodium 135 - 145 mmol/L 137  137  136   Potassium 3.5 - 5.1 mmol/L 4.0  3.8  3.9   Chloride 98 - 111 mmol/L 99  101  101   CO2 22 - 32 mmol/L 28  26  25  Calcium 8.9 - 10.3 mg/dL 9.0  8.9  8.7   Total Protein 6.5 - 8.1 g/dL 6.8  6.6  6.5   Total Bilirubin 0.3 - 1.2 mg/dL 0.7  0.8  0.6   Alkaline Phos 38 - 126 U/L 137  168  104   AST 15 - 41 U/L 22  19  29    ALT 0 - 44 U/L 20  16  37      No results found for: "CEA1", "CEA" / No results found for: "CEA1", "CEA" No results found for: "PSA1" No results found for: "[id number]" No results found for: "CAN125"  No results found for: "TOTALPROTELP", "ALBUMINELP", "A1GS", "A2GS", "BETS", "BETA2SER", "GAMS", "MSPIKE", "SPEI" No results found for: "TIBC", "FERRITIN", "IRONPCTSAT" No results found for: "LDH"   STUDIES:   ECHOCARDIOGRAM COMPLETE  Result Date: 07/09/2022    ECHOCARDIOGRAM REPORT   Patient Name:   Marilyn Ford Date of Exam: 07/09/2022 Medical Rec #:  [id number]        Height:       63.0 in Accession #:    [id number]       Weight:       143.0 lb Date of Birth:  1960/07/28        BSA:          1.677 m Patient Age:    73 years         BP:           151/81 mmHg Patient Gender: F                HR:           80 bpm. Exam Location:  Outpatient Procedure: 2D Echo, 3D Echo, Cardiac Doppler, Color Doppler and Strain Analysis Indications:    chemo  History:        Patient has prior history of Echocardiogram examinations.                 Stroke; Risk Factors:Diabetes and Hypertension.  Sonographer:    Phineas Douglas Referring Phys: [id number] [place]  1. Left ventricular ejection fraction, by estimation, is 60 to 65%. The left ventricle has normal function.  The left ventricle has no regional wall motion abnormalities. Left ventricular diastolic parameters are consistent with Grade I diastolic dysfunction (impaired relaxation).  2. Right ventricular systolic function is normal. The right ventricular size is normal. Tricuspid regurgitation signal is inadequate for assessing PA pressure.  3. The mitral valve is normal in structure. No evidence of mitral valve regurgitation.  4. The aortic valve is tricuspid. Aortic valve regurgitation is not visualized. No aortic stenosis is present.  5. The inferior vena cava is normal in size with greater than 50% respiratory variability, suggesting right atrial pressure of 3 mmHg. Comparison(s): No prior Echocardiogram. FINDINGS  Left Ventricle: Left ventricular ejection fraction, by estimation, is 60 to 65%. The left ventricle has normal function. The left ventricle has no regional wall motion abnormalities. The global longitudinal strain is near normal despite suboptimal segment tracking (-17.8% but likely underestimated due to suboptimal tracking). 3D left ventricular ejection fraction analysis performed but not reported based on interpreter judgement due to suboptimal tracking. The left ventricular internal cavity size  was normal in size. There is no concentric left ventricular hypertrophy. Left ventricular diastolic parameters are consistent with Grade I diastolic dysfunction (impaired relaxation). Right Ventricle: The right ventricular size is normal. No increase in right ventricular wall thickness. Right ventricular systolic function is normal. Tricuspid  regurgitation signal is inadequate for assessing PA pressure. Left Atrium: Left atrial size was normal in size. Right Atrium: Right atrial size was normal in size. Pericardium: There is no evidence of pericardial effusion. Mitral Valve: The mitral valve is normal in structure. No evidence of mitral valve regurgitation. Tricuspid Valve: The tricuspid valve is normal in  structure. Tricuspid valve regurgitation is not demonstrated. Aortic Valve: The aortic valve is tricuspid. Aortic valve regurgitation is not visualized. No aortic stenosis is present. Pulmonic Valve: The pulmonic valve was normal in structure. Pulmonic valve regurgitation is not visualized. Aorta: The aortic root and ascending aorta are structurally normal, with no evidence of dilitation. Venous: The inferior vena cava is normal in size with greater than 50% respiratory variability, suggesting right atrial pressure of 3 mmHg. IAS/Shunts: The atrial septum is grossly normal.  LEFT VENTRICLE PLAX 2D LVIDd:         3.80 cm     Diastology LVIDs:         2.40 cm     LV e' medial:    5.55 cm/s LV PW:         1.10 cm     LV E/e' medial:  13.0 LV IVS:        1.10 cm     LV e' lateral:   6.64 cm/s LVOT diam:     1.90 cm     LV E/e' lateral: 10.9 LV SV:         50 LV SV Index:   30          2D Longitudinal Strain LVOT Area:     2.84 cm    2D Strain GLS Avg:     -17.8 %  LV Volumes (MOD) LV vol d, MOD A2C: 83.9 ml 3D Volume EF: LV vol d, MOD A4C: 86.8 ml 3D EF:        55 % LV vol s, MOD A2C: 28.9 ml LV EDV:       121 ml LV vol s, MOD A4C: 32.5 ml LV ESV:       54 ml LV SV MOD A2C:     55.0 ml LV SV:        67 ml LV SV MOD A4C:     86.8 ml LV SV MOD BP:      53.4 ml RIGHT VENTRICLE             IVC RV Basal diam:  3.40 cm     IVC diam: 1.80 cm RV S prime:     14.00 cm/s TAPSE (M-mode): 1.7 cm LEFT ATRIUM             Index        RIGHT ATRIUM           Index LA diam:        3.30 cm 1.97 cm/m   RA Area:     11.70 cm LA Vol (A2C):   37.3 ml 22.25 ml/m  RA Volume:   22.10 ml  13.18 ml/m LA Vol (A4C):   27.2 ml 16.22 ml/m LA Biplane Vol: 33.2 ml 19.80 ml/m  AORTIC VALVE LVOT Vmax:   88.30 cm/s LVOT Vmean:  55.300 cm/s LVOT VTI:    0.176 m  AORTA Ao Root diam: 2.70 cm Ao Asc diam:  3.20 cm MITRAL VALVE MV Area (PHT): 3.61 cm    SHUNTS MV Decel Time: 210 msec    Systemic VTI:  0.18 m MV E velocity: 72.20  cm/s  Systemic Diam:  1.90 cm MV A velocity: 88.10 cm/s MV E/A ratio:  0.82 Gwyndolyn Kaufman MD Electronically signed by Gwyndolyn Kaufman MD Signature Date/Time: 07/09/2022/10:52:49 AM    Final

## 2022-07-23 ENCOUNTER — Inpatient Hospital Stay: Payer: No Typology Code available for payment source

## 2022-07-23 ENCOUNTER — Other Ambulatory Visit: Payer: No Typology Code available for payment source

## 2022-07-23 ENCOUNTER — Inpatient Hospital Stay (HOSPITAL_BASED_OUTPATIENT_CLINIC_OR_DEPARTMENT_OTHER): Payer: No Typology Code available for payment source | Admitting: Hematology

## 2022-07-23 ENCOUNTER — Ambulatory Visit: Payer: No Typology Code available for payment source | Admitting: Physician Assistant

## 2022-07-23 ENCOUNTER — Encounter: Payer: Self-pay | Admitting: Hematology

## 2022-07-23 VITALS — BP 124/82 | HR 95 | Temp 97.4°F | Resp 18 | Ht 63.0 in | Wt 135.4 lb

## 2022-07-23 DIAGNOSIS — C50811 Malignant neoplasm of overlapping sites of right female breast: Secondary | ICD-10-CM | POA: Diagnosis not present

## 2022-07-23 DIAGNOSIS — Z171 Estrogen receptor negative status [ER-]: Secondary | ICD-10-CM | POA: Diagnosis not present

## 2022-07-23 DIAGNOSIS — Z95828 Presence of other vascular implants and grafts: Secondary | ICD-10-CM

## 2022-07-23 DIAGNOSIS — N6313 Unspecified lump in the right breast, lower outer quadrant: Secondary | ICD-10-CM

## 2022-07-23 DIAGNOSIS — Z5112 Encounter for antineoplastic immunotherapy: Secondary | ICD-10-CM | POA: Diagnosis not present

## 2022-07-23 LAB — CBC WITH DIFFERENTIAL/PLATELET
Abs Immature Granulocytes: 0 10*3/uL (ref 0.00–0.07)
Band Neutrophils: 16 %
Basophils Absolute: 0 10*3/uL (ref 0.0–0.1)
Basophils Relative: 0 %
Eosinophils Absolute: 0 10*3/uL (ref 0.0–0.5)
Eosinophils Relative: 0 %
HCT: 25.8 % — ABNORMAL LOW (ref 36.0–46.0)
Hemoglobin: 8.3 g/dL — ABNORMAL LOW (ref 12.0–15.0)
Lymphocytes Relative: 21 %
Lymphs Abs: 0.9 10*3/uL (ref 0.7–4.0)
MCH: 32.2 pg (ref 26.0–34.0)
MCHC: 32.2 g/dL (ref 30.0–36.0)
MCV: 100 fL (ref 80.0–100.0)
Metamyelocytes Relative: 1 %
Monocytes Absolute: 0.1 10*3/uL (ref 0.1–1.0)
Monocytes Relative: 3 %
Neutro Abs: 3.2 10*3/uL (ref 1.7–7.7)
Neutrophils Relative %: 59 %
Platelets: 119 10*3/uL — ABNORMAL LOW (ref 150–400)
RBC: 2.58 MIL/uL — ABNORMAL LOW (ref 3.87–5.11)
RDW: 21.3 % — ABNORMAL HIGH (ref 11.5–15.5)
WBC: 4.2 10*3/uL (ref 4.0–10.5)
nRBC: 0.5 % — ABNORMAL HIGH (ref 0.0–0.2)
nRBC: 2 /100 WBC — ABNORMAL HIGH

## 2022-07-23 LAB — COMPREHENSIVE METABOLIC PANEL
ALT: 20 U/L (ref 0–44)
AST: 22 U/L (ref 15–41)
Albumin: 3.5 g/dL (ref 3.5–5.0)
Alkaline Phosphatase: 137 U/L — ABNORMAL HIGH (ref 38–126)
Anion gap: 10 (ref 5–15)
BUN: 8 mg/dL (ref 8–23)
CO2: 28 mmol/L (ref 22–32)
Calcium: 9 mg/dL (ref 8.9–10.3)
Chloride: 99 mmol/L (ref 98–111)
Creatinine, Ser: 0.91 mg/dL (ref 0.44–1.00)
GFR, Estimated: >60 mL/min (ref >60)
Glucose, Bld: 119 mg/dL — ABNORMAL HIGH (ref 70–99)
Potassium: 4 mmol/L (ref 3.5–5.1)
Sodium: 137 mmol/L (ref 135–145)
Total Bilirubin: 0.7 mg/dL (ref 0.3–1.2)
Total Protein: 6.8 g/dL (ref 6.5–8.1)

## 2022-07-23 LAB — MAGNESIUM: Magnesium: 1.8 mg/dL (ref 1.7–2.4)

## 2022-07-23 LAB — SAMPLE TO BLOOD BANK

## 2022-07-23 MED ORDER — SODIUM CHLORIDE 0.9% FLUSH
10.0000 mL | Freq: Once | INTRAVENOUS | Status: AC
  Administered 2022-07-23: 10 mL via INTRAVENOUS

## 2022-07-23 MED ORDER — HEPARIN SOD (PORK) LOCK FLUSH 100 UNIT/ML IV SOLN
500.0000 [IU] | Freq: Once | INTRAVENOUS | Status: AC
  Administered 2022-07-23: 500 [IU] via INTRAVENOUS

## 2022-07-23 NOTE — Patient Instructions (Signed)
[place] at [facility] Discharge Instructions   You were seen and examined today by Dr. Delton Coombes.  He reviewed the results of your lab work which are mostly normal/stable. Your magnesium is slightly low at 1.6.   Dr. Raliegh Ip recommends once your mouth sores have healed that you use a mixture of 1 quart of water with a teaspoon of salt and a teaspoon of baking soda. Use this to rinse your mouth every 2 hours while awake to help prevent your mouth sores.   Return as scheduled.    Thank you for choosing [place] [facility] to provide your oncology and hematology care.  To afford each patient quality time with our provider, please arrive at least 15 minutes before your scheduled appointment time.   If you have a lab appointment with the [facility] please come in thru the Main Entrance and check in at the main information desk.  You need to re-schedule your appointment should you arrive 10 or more minutes late.  We strive to give you quality time with our providers, and arriving late affects you and other patients whose appointments are after yours.  Also, if you no show three or more times for appointments you may be dismissed from the clinic at the providers discretion.     Again, thank you for choosing [facility].  Our hope is that these requests will decrease the amount of time that you wait before being seen by our physicians.       _____________________________________________________________  Should you have questions after your visit to [facility], please contact our office at [phone] and follow the prompts.  Our office hours are 8:00 a.m. and 4:30 p.m. Monday - Friday.  Please note that voicemails left after 4:00 p.m. may not be returned until the following business day.  We are closed weekends and major holidays.  You do have access to a nurse 24-7, just call the main number to the clinic [phone]  and do not press any options, hold on the line and a nurse will answer the phone.    For prescription refill requests, have your pharmacy contact our office and allow 72 hours.    Due to Covid, you will need to wear a mask upon entering the hospital. If you do not have a mask, a mask will be given to you at the Main Entrance upon arrival. For doctor visits, patients may have 1 support person age 57 or older with them. For treatment visits, patients can not have anyone with them due to social distancing guidelines and our immunocompromised population.

## 2022-07-23 NOTE — Progress Notes (Signed)
No fluids today per Dr. Delton Coombes,  [facility] flushed with good blood return noted. No bruising or swelling at site. Bandaid applied and patient discharged in satisfactory condition. VVS stable with no signs or symptoms of distressed noted.

## 2022-07-23 NOTE — Patient Instructions (Signed)
[place]  Discharge Instructions: Thank you for choosing [facility] to provide your oncology and hematology care.  If you have a lab appointment with the [facility], please come in thru the Main Entrance and check in at the main information desk.  Wear comfortable clothing and clothing appropriate for easy access to any Portacath or PICC line.   We strive to give you quality time with your provider. You may need to reschedule your appointment if you arrive late (15 or more minutes).  Arriving late affects you and other patients whose appointments are after yours.  Also, if you miss three or more appointments without notifying the office, you may be dismissed from the clinic at the provider's discretion.      For prescription refill requests, have your pharmacy contact our office and allow 72 hours for refills to be completed.    Today you received the following port flushed and lab draw, no fluids needed today, return as scheduled.   To help prevent nausea and vomiting after your treatment, we encourage you to take your nausea medication as directed.  BELOW ARE SYMPTOMS THAT SHOULD BE REPORTED IMMEDIATELY: *FEVER GREATER THAN 100.4 F (38 C) OR HIGHER *CHILLS OR SWEATING *NAUSEA AND VOMITING THAT IS NOT CONTROLLED WITH YOUR NAUSEA MEDICATION *UNUSUAL SHORTNESS OF BREATH *UNUSUAL BRUISING OR BLEEDING *URINARY PROBLEMS (pain or burning when urinating, or frequent urination) *BOWEL PROBLEMS (unusual diarrhea, constipation, pain near the anus) TENDERNESS IN MOUTH AND THROAT WITH OR WITHOUT PRESENCE OF ULCERS (sore throat, sores in mouth, or a toothache) UNUSUAL RASH, SWELLING OR PAIN  UNUSUAL VAGINAL DISCHARGE OR ITCHING   Items with * indicate a potential emergency and should be followed up as soon as possible or go to the Emergency Department if any problems should occur.  Please show the CHEMOTHERAPY ALERT CARD or IMMUNOTHERAPY ALERT CARD at  check-in to the Emergency Department and triage nurse.  Should you have questions after your visit or need to cancel or reschedule your appointment, please contact [facility] [place] [phone]  and follow the prompts.  Office hours are 8:00 a.m. to 4:30 p.m. Monday - Friday. Please note that voicemails left after 4:00 p.m. may not be returned until the following business day.  We are closed weekends and major holidays. You have access to a nurse at all times for urgent questions. Please call the main number to the clinic [phone] and follow the prompts.  For any non-urgent questions, you may also contact your provider using MyChart. We now offer e-Visits for anyone 33 and older to request care online for non-urgent symptoms. For details visit [email or web address].   Also download the MyChart app! Go to the app store, search "MyChart", open the app, select [facility], and log in with your MyChart username and password.

## 2022-07-23 NOTE — Telephone Encounter (Signed)
Called patient, no answer, sent MyChart message

## 2022-07-23 NOTE — Telephone Encounter (Signed)
Pt aware.

## 2022-07-25 ENCOUNTER — Other Ambulatory Visit: Payer: No Typology Code available for payment source

## 2022-07-25 ENCOUNTER — Encounter: Payer: Self-pay | Admitting: *Deleted

## 2022-07-25 ENCOUNTER — Ambulatory Visit: Payer: No Typology Code available for payment source

## 2022-07-25 ENCOUNTER — Ambulatory Visit: Payer: No Typology Code available for payment source | Admitting: Hematology

## 2022-07-27 ENCOUNTER — Ambulatory Visit: Payer: No Typology Code available for payment source

## 2022-07-30 ENCOUNTER — Other Ambulatory Visit: Payer: Self-pay | Admitting: Hematology

## 2022-07-31 NOTE — Telephone Encounter (Signed)
Submitted re-enrollment application for TRULICITY 4.5MG  to [place] for patient assistance.   Phone: [phone]

## 2022-08-01 ENCOUNTER — Inpatient Hospital Stay: Payer: No Typology Code available for payment source

## 2022-08-01 ENCOUNTER — Inpatient Hospital Stay (HOSPITAL_BASED_OUTPATIENT_CLINIC_OR_DEPARTMENT_OTHER): Payer: No Typology Code available for payment source | Admitting: Hematology

## 2022-08-01 VITALS — BP 130/73 | HR 88 | Temp 98.0°F | Resp 18

## 2022-08-01 VITALS — BP 105/67 | HR 89 | Temp 97.9°F | Resp 18 | Wt 132.4 lb

## 2022-08-01 DIAGNOSIS — Z95828 Presence of other vascular implants and grafts: Secondary | ICD-10-CM

## 2022-08-01 DIAGNOSIS — C50811 Malignant neoplasm of overlapping sites of right female breast: Secondary | ICD-10-CM

## 2022-08-01 DIAGNOSIS — Z171 Estrogen receptor negative status [ER-]: Secondary | ICD-10-CM

## 2022-08-01 DIAGNOSIS — Z5112 Encounter for antineoplastic immunotherapy: Secondary | ICD-10-CM | POA: Diagnosis not present

## 2022-08-01 LAB — COMPREHENSIVE METABOLIC PANEL
ALT: 25 U/L (ref 0–44)
AST: 24 U/L (ref 15–41)
Albumin: 3.1 g/dL — ABNORMAL LOW (ref 3.5–5.0)
Alkaline Phosphatase: 119 U/L (ref 38–126)
Anion gap: 9 (ref 5–15)
BUN: 15 mg/dL (ref 8–23)
CO2: 27 mmol/L (ref 22–32)
Calcium: 8.5 mg/dL — ABNORMAL LOW (ref 8.9–10.3)
Chloride: 101 mmol/L (ref 98–111)
Creatinine, Ser: 1.09 mg/dL — ABNORMAL HIGH (ref 0.44–1.00)
GFR, Estimated: 58 mL/min — ABNORMAL LOW (ref >60)
Glucose, Bld: 127 mg/dL — ABNORMAL HIGH (ref 70–99)
Potassium: 4.1 mmol/L (ref 3.5–5.1)
Sodium: 137 mmol/L (ref 135–145)
Total Bilirubin: 0.6 mg/dL (ref 0.3–1.2)
Total Protein: 5.9 g/dL — ABNORMAL LOW (ref 6.5–8.1)

## 2022-08-01 LAB — CBC WITH DIFFERENTIAL/PLATELET
Abs Immature Granulocytes: 0.03 10*3/uL (ref 0.00–0.07)
Basophils Absolute: 0.1 10*3/uL (ref 0.0–0.1)
Basophils Relative: 1 %
Eosinophils Absolute: 0 10*3/uL (ref 0.0–0.5)
Eosinophils Relative: 0 %
HCT: 26 % — ABNORMAL LOW (ref 36.0–46.0)
Hemoglobin: 8.3 g/dL — ABNORMAL LOW (ref 12.0–15.0)
Immature Granulocytes: 0 %
Lymphocytes Relative: 9 %
Lymphs Abs: 0.8 10*3/uL (ref 0.7–4.0)
MCH: 32.7 pg (ref 26.0–34.0)
MCHC: 31.9 g/dL (ref 30.0–36.0)
MCV: 102.4 fL — ABNORMAL HIGH (ref 80.0–100.0)
Monocytes Absolute: 0.6 10*3/uL (ref 0.1–1.0)
Monocytes Relative: 7 %
Neutro Abs: 7.1 10*3/uL (ref 1.7–7.7)
Neutrophils Relative %: 83 %
Platelets: 308 10*3/uL (ref 150–400)
RBC: 2.54 MIL/uL — ABNORMAL LOW (ref 3.87–5.11)
RDW: 23.2 % — ABNORMAL HIGH (ref 11.5–15.5)
WBC: 8.5 10*3/uL (ref 4.0–10.5)
nRBC: 0 % (ref 0.0–0.2)

## 2022-08-01 LAB — SAMPLE TO BLOOD BANK

## 2022-08-01 LAB — MAGNESIUM: Magnesium: 2.1 mg/dL (ref 1.7–2.4)

## 2022-08-01 LAB — TSH: TSH: 0.375 u[IU]/mL (ref 0.350–4.500)

## 2022-08-01 MED ORDER — DOXORUBICIN HCL CHEMO IV INJECTION 2 MG/ML
98.0000 mg | Freq: Once | INTRAVENOUS | Status: AC
  Administered 2022-08-01: 98 mg via INTRAVENOUS
  Filled 2022-08-01: qty 49

## 2022-08-01 MED ORDER — SODIUM CHLORIDE 0.9% FLUSH
10.0000 mL | Freq: Once | INTRAVENOUS | Status: AC
  Administered 2022-08-01: 10 mL via INTRAVENOUS

## 2022-08-01 MED ORDER — SODIUM CHLORIDE 0.9 % IV SOLN: Freq: Once | INTRAVENOUS | Status: AC

## 2022-08-01 MED ORDER — HEPARIN SOD (PORK) LOCK FLUSH 100 UNIT/ML IV SOLN
500.0000 [IU] | Freq: Once | INTRAVENOUS | Status: AC | PRN
  Administered 2022-08-01: 500 [IU]

## 2022-08-01 MED ORDER — ONDANSETRON HCL 8 MG PO TABS: 8.0000 mg | ORAL_TABLET | Freq: Three times a day (TID) | ORAL | 3 refills | Status: DC | PRN

## 2022-08-01 MED ORDER — PALONOSETRON HCL INJECTION 0.25 MG/5ML
0.2500 mg | Freq: Once | INTRAVENOUS | Status: AC
  Administered 2022-08-01: 0.25 mg via INTRAVENOUS
  Filled 2022-08-01: qty 5

## 2022-08-01 MED ORDER — SODIUM CHLORIDE 0.9 % IV SOLN
1000.0000 mg | Freq: Once | INTRAVENOUS | Status: AC
  Administered 2022-08-01: 1000 mg via INTRAVENOUS
  Filled 2022-08-01: qty 50

## 2022-08-01 MED ORDER — SODIUM CHLORIDE 0.9% FLUSH
10.0000 mL | INTRAVENOUS | Status: DC | PRN
  Administered 2022-08-01: 10 mL

## 2022-08-01 MED ORDER — SODIUM CHLORIDE 0.9 % IV SOLN
200.0000 mg | Freq: Once | INTRAVENOUS | Status: AC
  Administered 2022-08-01: 200 mg via INTRAVENOUS
  Filled 2022-08-01: qty 8

## 2022-08-01 MED ORDER — SODIUM CHLORIDE 0.9 % IV SOLN
150.0000 mg | Freq: Once | INTRAVENOUS | Status: AC
  Administered 2022-08-01: 150 mg via INTRAVENOUS
  Filled 2022-08-01: qty 150

## 2022-08-01 NOTE — Progress Notes (Signed)
Patient presents today for treatment and follow up visit with Dr. Delton Coombes. Labs pending. Vital signs within parameters for treatment.   Message received from A. Ouida Sills RN / Dr. Delton Coombes to proceed with treatment. Labs reviewed and within parameters for treatment. Order received to infuse a 500 ml bolus of normal saline over 1 hour.   Treatment given today per MD orders. Tolerated infusion without adverse affects. Vital signs stable. No complaints at this time. Discharged from clinic by wheel chair in stable condition. Alert and oriented x 3. F/U with [facility] as scheduled.

## 2022-08-01 NOTE — Progress Notes (Signed)
[facility] [address] [place] [address]    Clinic Day:  08/01/2022  Referring physician: Claretta Fraise, MD  Patient Care Team: Claretta Fraise, MD as PCP - General (Family Medicine) Claretta Fraise, MD (Family Medicine) Lavera Guise, [facility] as Pharmacist (Family Medicine) Montez Morita, Quillian Quince, MD as Consulting Physician (Gastroenterology) Aviva Signs, MD as Consulting Physician (General Surgery) Mauro Kaufmann, RN as Oncology Nurse Navigator Rockwell Germany, RN as Oncology Nurse Navigator Derek Jack, MD as Medical Oncologist (Medical Oncology) Brien Mates, RN as Oncology Nurse Navigator (Medical Oncology) Derek Jack, MD as Consulting Physician (Hematology) Claretta Fraise, MD as Referring Physician (Family Medicine)   ASSESSMENT & PLAN:   Assessment: 1.  Inflammatory right breast cancer: - Bilateral diagnostic mammogram (02/20/2022): Suspicious right axillary lymphadenopathy.  Indeterminate intramammary lymph node in the right breast at 9:00.  New right breast skin and trabecular thickening, related to vascular congestion from enlarged lymph nodes in the right axilla. - Right axillary lymph node core biopsy (02/20/2022): Morphology and IHC compatible with primary breast cancer with differential diagnosis of urothelial carcinoma and primary lung carcinoma (squamous).  Grade 3.  ER 0%, PR 0%, HER2 2+, Ki-67 60%, HER2 negative by FISH. - Right breast lymph node biopsy at 9:00 (02/20/2022): Negative for carcinoma. - MRI breast (03/02/2022): In the upper outer right breast, mid to posterior depth there is a patchy clumped non-mass enhancement in a linear orientation spanning approximately 3.8 cm.  There is diffuse thickening of the skin in the right breast with skin enhancement.  Left breast with no mass or abnormal enhancement.  Numerous bulky matted lymph nodes in the right axilla. - Right breast UOQ biopsy (03/19/2022): Benign  breast with fibrocystic changes including stromal fibrosis, adenosis, usual ductal hyperplasia.  Negative for carcinoma. - PET scan (03/22/2022): Bulky hypermetabolic right axillary and subpectoral lymphadenopathy.  No other definite new sites of metastatic disease.  6 mm right lower lobe lung nodule with no FDG uptake.  Plan:  1.  Triple negative right breast cancer: - Cycle 1 of before meals and pembrolizumab on 07/12/2022. - Labs today shows creatinine slightly increased 1.09.  CBC was grossly normal. - She will receive 500 mL normal saline today.  Proceed with cycle 2 without any dose modifications.  RTC 3 weeks for follow-up.   2.  Hypomagnesemia: - Continue magnesium capsule twice daily.  Magnesium is normal today.  3.  Weight loss: - She has lost 2 and half pounds in the last 2 weeks and 10 pounds since 07/12/2022. - She is drinking 1 can of boost per day.  She was told to increase it to 2 cans/day and increase oral intake.  No orders of the defined types were placed in this encounter.   I,Alexis Herring,acting as a Education administrator for [employer], MD.,have documented all relevant documentation on the behalf of Derek Jack, MD,as directed by  Derek Jack, MD while in the presence of Derek Jack, MD.  I, Derek Jack MD, have reviewed the above documentation for accuracy and completeness, and I agree with the above.    Derek Jack, MD   3/27/20245:23 PM  CHIEF COMPLAINT:   Diagnosis: locally advanced TNBC    Cancer Staging  Malignant neoplasm of overlapping sites of right female breast [facility]) Staging form: Breast, AJCC 8th Edition - Clinical stage from 03/08/2022: Stage IIIC (cT4d, cN2, cM0, G3, ER-, PR-, HER2-) - Signed by Truitt Merle, MD on 03/08/2022    Prior Therapy: None  Current Therapy:  Pembrolizumab, carboplatin and paclitaxel followed by pembrolizumab, Adriamycin and Cytoxan   HISTORY OF PRESENT ILLNESS:   Oncology History  Overview Note   Cancer Staging  Malignant neoplasm of overlapping sites of right female breast [facility]) Staging form: Breast, AJCC 8th Edition - Clinical stage from 03/08/2022: Stage IIIC (cT2, cN2, cM0, G3, ER-, PR-, HER2-) - Signed by Truitt Merle, MD on 03/08/2022    Malignant neoplasm of overlapping sites of right female breast [facility])  02/20/2022 Mammogram   CLINICAL DATA:  62 year old female recalled from screening mammography 03/01/2021 for right breast calcifications and subsequent benign discordant biopsy of these calcifications in the central posterior right breast December 2022 with excision recommended. The patient initially followed up with surgery in April however canceled her scheduled surgery and most recently followed up with Dr. [name] September 2023 with diagnostic imaging, possible RF tag placement and subsequent excision recommended.   EXAM: DIGITAL DIAGNOSTIC BILATERAL MAMMOGRAM WITH TOMOSYNTHESIS; ULTRASOUND RIGHT BREAST LIMITED  MPRESSION: 1.  Suspicious right axillary lymphadenopathy.   2. Indeterminate intramammary lymph node in the right breast at 9 o'clock.   3. 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   4. Decreased calcifications noted at prior benign biopsy site in the lower central posterior right breast at site of X shaped biopsy marking clip.   02/20/2022 Initial Biopsy   FINAL MICROSCOPIC DIAGNOSIS:   A. AXILLA, RIGHT, LYMPH NODE, NEEDLE CORE BIOPSY:  - Positive for carcinoma (see Comment)   B. LYMPH NODE, RIGHT BREAST, BIOPSY:  - Negative for carcinoma   COMMENT:  Part A: Morphology and immunohistochemical staining are most compatible with primary breast carcinoma with metaplastic changes, however differential diagnosis also includes urothelial carcinoma and less likely primary lung carcinoma  (squamous).  No lymphoid tissue is identified.  Clinical and radiologic correlation is suggested.   ADDENDUM:  In case of a breast origin, the appropriate grade would be grade 3  (3+3+2)   ADDENDUM:  PROGNOSTIC INDICATOR RESULTS:  The tumor cells are EQUIVOCAL for Her2 (2+).  Estrogen Receptor:       0%, NEGATIVE  Progesterone Receptor:   0%, NEGATIVE  Proliferation Marker Ki-67:   60%   ADDENDUM:  FLOURESCENCE IN-SITU HYBRIDIZATION RESULTS:  GROUP 5:   HER2 **NEGATIVE**    03/02/2022 Imaging   EXAM: BILATERAL BREAST MRI WITH AND WITHOUT CONTRAST  IMPRESSION: 1. There is a suspicious 3.8 cm area of patchy non mass enhancement in a linear orientation in the slightly upper outer right breast in the mid to posterior depth spanning 3.8 cm.   2. Diffuse skin thickening with enhancement of the skin, concerning for inflammatory breast cancer.   3. Numerous bulky matted lymph nodes in the right axilla, one of which corresponds with the biopsy-proven metastatic lymph node.   4.  No evidence of left breast malignancy.   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 ([place])   03/08/2022 Cancer Staging   Staging form: Breast, AJCC 8th Edition - Clinical stage from 03/08/2022: Stage IIIC (cT4d, cN2, cM0, G3, ER-, PR-, HER2-) - Signed by Truitt Merle, MD on 03/08/2022 Histologic grading system: 3 grade system   03/19/2022 Pathology Results   Diagnosis Breast, right, needle core biopsy, upper outer quadrant, barbell clip BENIGN BREAST WITH FIBROCYSTIC CHANGES INCLUDING STROMAL FIBROSIS, ADENOSIS AND USUAL DUCT HYPERPLASIA BENIGN FIBROMATOID CHANGE NEGATIVE  FOR MICROCALCIFICATIONS NEGATIVE FOR CARCINOMA   03/22/2022 PET scan   IMPRESSION: Bulky hypermetabolic right axillary and subpectoral lymphadenopathy, consistent with metastatic disease.   No other definite sites of metastatic disease identified.   6 mm right lower lobe pulmonary  nodule shows no FDG uptake, but is too small to definitively characterize by PET. Recommend continued follow-up by chest CT in 3-4 months.   Aortic Atherosclerosis (ICD10-I70.0).   04/02/2022 -  Chemotherapy   Patient is on Treatment Plan : BREAST Pembrolizumab (200) D1 + Carboplatin (5) D1 + Paclitaxel (80) D1,8,15 q21d X 4 cycles / Pembrolizumab (200) D1 + AC D1 q21d x 4 cycles      Genetic Testing   Negative genetic testing. No pathogenic variants identified on the [facility] Common Hereditary Cancers+RNA panel. The report date is 05/31/2022.  The Common Hereditary Cancers Panel + RNA offered by [facility] includes sequencing and/or deletion duplication testing of the following 48 genes: APC*, ATM*, AXIN2, BAP1, BARD1, BMPR1A, BRCA1, BRCA2, BRIP1, CDH1, CDK4, CDKN2A (p14ARF), CDKN2A (p16INK4a), CHEK2, CTNNA1, DICER1*, EPCAM*, FH*, GREM1*, HOXB13, KIT, MBD4, MEN1*, MLH1*, MSH2*, MSH3*, MSH6*, MUTYH, NF1*, NTHL1, PALB2, PDGFRA, PMS2*, POLD1*, POLE, PTEN*, RAD51C, RAD51D, SDHA*, SDHB, SDHC*, SDHD, SMAD4, SMARCA4, STK11, TP53, TSC1*, TSC2, VHL.      INTERVAL HISTORY:   Dellar is a 62 y.o. female presenting to clinic today for follow up of locally advanced TNBC. She was last seen by me on 07/23/22.  Today, she states that she is doing okay but has had difficulty with nausea since last visit. Her appetite level is at 20%. She drinks 1 Boost Plus and eats 3 popsicles per day. She is otherwise not eating meals but may have broth or soup some days. She has lost 3lbs since last visit on 3/18. She denies having any diarrhea with drinking Boost. She is able to taste some foods but she does not usually desire to eat. She would like to speak with our nutritionist when she is available. Her energy level is at 30%.  She denies any skin rashes, dry cough, or shortness of breath.  PAST MEDICAL HISTORY:   Past Medical History: Past Medical History:  Diagnosis Date   Allergy    Anxiety    Breast cancer ([place])     Diabetes mellitus without complication ([place])    GERD (gastroesophageal reflux disease)    Heart murmur    as a child   History of kidney stones    Hypertension    Pneumonia    Stroke [facility])    Vaginal Pap smear, abnormal     Surgical History: Past Surgical History:  Procedure Laterality Date   CESAREAN SECTION     CHOLECYSTECTOMY     COLONOSCOPY WITH PROPOFOL N/A 07/19/2020   Procedure: COLONOSCOPY WITH PROPOFOL;  Surgeon: Harvel Quale, MD;  Location: [facility];  Service: Gastroenterology;  Laterality: N/A;  AM   POLYPECTOMY  07/19/2020   Procedure: POLYPECTOMY;  Surgeon: Harvel Quale, MD;  Location: [facility];  Service: Gastroenterology;;   PORTACATH PLACEMENT N/A 03/20/2022   Procedure: INSERTION PORT-A-CATH;  Surgeon: Coralie Keens, MD;  Location: [facility];  Service: General;  Laterality: N/A;    Social History: Social History   Socioeconomic History   Marital status: Married    Spouse name: Jeneen Rinks   Number of children: 2   Years of education: 10   Highest education level: 10th grade  Occupational History   Occupation: disabled  Tobacco Use   Smoking status: Former  Packs/day: 0.50    Years: 25.00    Additional pack years: 0.00    Total pack years: 12.50    Types: Cigarettes    Start date: 12/08/1999    Quit date: 03/12/2015    Years since quitting: 7.3   Smokeless tobacco: Never  Vaping Use   Vaping Use: Never used  Substance and Sexual Activity   Alcohol use: Yes    Alcohol/week: 2.0 - 3.0 standard drinks of alcohol    Types: 2 - 3 Glasses of wine per week   Drug use: No   Sexual activity: Not Currently    Birth control/protection: Abstinence, Post-menopausal  Other Topics Concern   Not on file  Social History Narrative   Volunteers at [employer] for a few hours every day.    She really enjoys getting out of of the house and working there.    Social Determinants of Health   Financial Resource Strain: Low  Risk  (03/01/2022)   Overall Financial Resource Strain (CARDIA)    Difficulty of Paying Living Expenses: Not very hard  Food Insecurity: No Food Insecurity (04/09/2022)   Hunger Vital Sign    Worried About Running Out of Food in the Last Year: Never true    Ran Out of Food in the Last Year: Never true  Transportation Needs: No Transportation Needs (03/01/2022)   PRAPARE - Hydrologist (Medical): No    Lack of Transportation (Non-Medical): No  Physical Activity: Insufficiently Active (03/01/2022)   Exercise Vital Sign    Days of Exercise per Week: 1 day    Minutes of Exercise per Session: 10 min  Stress: Stress Concern Present (03/01/2022)   [place]    Feeling of Stress : Rather much  Social Connections: Moderately Isolated (03/01/2022)   Social Connection and Isolation Panel [NHANES]    Frequency of Communication with Friends and Family: Twice a week    Frequency of Social Gatherings with Friends and Family: Twice a week    Attends Religious Services: Never    Marine scientist or Organizations: No    Attends Archivist Meetings: Never    Marital Status: Married  Human resources officer Violence: Not At Risk (04/09/2022)   Humiliation, Afraid, Rape, and Kick questionnaire    Fear of Current or Ex-Partner: No    Emotionally Abused: No    Physically Abused: No    Sexually Abused: No    Family History: Family History  Problem Relation Age of Onset   Diabetes Mother    Uterine cancer Mother 23 - 23   Diabetes Brother    Breast cancer Maternal Aunt        dx >50, d. from cancer   Cancer Maternal Grandmother        unk type, "back cancer?"    Current Medications:  Current Outpatient Medications:    Alcohol Swabs (B-D SINGLE USE SWABS REGULAR) PADS, Test BS daily and as needed Dx E11.9, Disp: 100 each, Rfl: 3   aluminum-magnesium hydroxide-simethicone (MAALOX) 200-200-20  MG/5ML SUSP, Take 30 mLs by mouth 4 (four) times daily -  before meals and at bedtime., Disp: 480 mL, Rfl: 2   amLODipine (NORVASC) 10 MG tablet, 1 tablet daily, Disp: 90 tablet, Rfl: 3   Apple Cider Vinegar 500 MG TABS, Take 500 mg by mouth in the morning., Disp: , Rfl:    aspirin 81 MG EC tablet, Take 1 tablet (  81 mg total) by mouth daily., Disp: 30 tablet, Rfl: 0   atorvastatin (LIPITOR) 80 MG tablet, TAKE 1 TABLET EVERY DAY AT 6PM, Disp: 90 tablet, Rfl: 3   Blood Glucose Calibration (TRUE METRIX LEVEL 1) Low SOLN, Use with glucometer Dx E11.9, Disp: 3 each, Rfl: 0   Blood Glucose Monitoring Suppl (TRUE METRIX AIR GLUCOSE METER) w/Device KIT, Test BS daily and as needed Dx E11.9, Disp: 1 kit, Rfl: 0   Calcium Carb-Cholecalciferol (CALCIUM-VITAMIN D) 600-400 MG-UNIT TABS, Take 1 tablet by mouth in the morning., Disp: , Rfl:    Coenzyme Q10 (COQ10) 100 MG CAPS, Take 100 mg by mouth in the morning., Disp: , Rfl:    Cranberry 425 MG CAPS, Take 425 mg by mouth in the morning and at bedtime., Disp: , Rfl:    Dulaglutide (TRULICITY) 4.5 0000000 SOPN, Inject 4.5 mg as directed once a week., Disp: 6 mL, Rfl: 1   Flaxseed, Linseed, (FLAXSEED OIL) 1000 MG CAPS, Take 1,000 mg by mouth in the morning., Disp: , Rfl:    gabapentin (NEURONTIN) 300 MG capsule, Take 1 capsule (300 mg total) by mouth 3 (three) times daily., Disp: 90 capsule, Rfl: 0   glucose blood (TRUE METRIX BLOOD GLUCOSE TEST) test strip, Test BS daily and as needed Dx E11.9, Disp: 100 each, Rfl: 3   icosapent Ethyl (VASCEPA) 1 g capsule, Take 2 g by mouth 2 (two) times daily., Disp: , Rfl:    Krill Oil 500 MG CAPS, Take 3 capsules (1,500 mg total) by mouth in the morning and at bedtime., Disp: , Rfl:    lidocaine-prilocaine (EMLA) cream, Apply to affected area once, Disp: 30 g, Rfl: 3   LORazepam (ATIVAN) 1 MG tablet, Take 1 mg by mouth every 8 (eight) hours as needed., Disp: , Rfl:    magnesium oxide (MAG-OX) 400 (240 Mg) MG tablet, TAKE 1  TABLET BY MOUTH TWICE A DAY, Disp: 60 tablet, Rfl: 1   meloxicam (MOBIC) 15 MG tablet, Take 15 mg by mouth daily., Disp: , Rfl:    metoprolol (TOPROL-XL) 200 MG 24 hr tablet, TAKE 1 TABLET ONE TIME DAILY, WITH OR IMMEDIATELY FOLLOWING A MEAL, Disp: 90 tablet, Rfl: 3   Multiple Vitamin (MULTIVITAMIN) capsule, Take 1 capsule by mouth in the morning., Disp: , Rfl:    OLANZapine (ZYPREXA) 5 MG tablet, Take 1 tablet (5 mg total) by mouth at bedtime., Disp: 30 tablet, Rfl: 2   ondansetron (ZOFRAN) 8 MG tablet, Take 1 tablet (8 mg total) by mouth every 8 (eight) hours as needed for nausea or vomiting., Disp: 30 tablet, Rfl: 3   pantoprazole (PROTONIX) 40 MG tablet, TAKE 1 TABLET twice daily FOR STOMACH, Disp: 180 tablet, Rfl: 3   traMADol (ULTRAM) 50 MG tablet, Take 1 tablet (50 mg total) by mouth every 6 (six) hours as needed for moderate pain or severe pain., Disp: 20 tablet, Rfl: 0   traZODone (DESYREL) 150 MG tablet, TAKE 1 OR 2 TABLETS AT BEDTIME FOR SLEEP, Disp: 180 tablet, Rfl: 3   TRUEplus Lancets 33G MISC, Test BS daily and as needed Dx E11.9, Disp: 100 each, Rfl: 3   valsartan (DIOVAN) 320 MG tablet, Take 1 tablet (320 mg total) by mouth daily. For blood pressure., Disp: 90 tablet, Rfl: 1   zinc gluconate 50 MG tablet, Take 50 mg by mouth daily., Disp: , Rfl:    prochlorperazine (COMPAZINE) 10 MG tablet, Take 1 tablet (10 mg total) by mouth every 6 (six) hours as  needed for nausea or vomiting. (Patient not taking: Reported on 08/01/2022), Disp: 30 tablet, Rfl: 1 No current facility-administered medications for this visit.  Facility-Administered Medications Ordered in Other Visits:    sodium chloride flush (NS) 0.9 % injection 10 mL, 10 mL, Intracatheter, PRN, Derek Jack, MD, 10 mL at 08/01/22 1303   Allergies: Allergies  Allergen Reactions   Penicillins Shortness Of Breath   Sulfa Antibiotics Rash    REVIEW OF SYSTEMS:   Review of Systems  Constitutional:  Positive for  appetite change and fatigue. Negative for chills and fever.  HENT:   Positive for trouble swallowing. Negative for lump/mass, mouth sores, nosebleeds and sore throat.   Eyes:  Negative for eye problems.  Respiratory:  Negative for cough and shortness of breath.   Cardiovascular:  Negative for chest pain, leg swelling and palpitations.  Gastrointestinal:  Positive for diarrhea and nausea. Negative for abdominal pain, constipation and vomiting.  Genitourinary:  Negative for bladder incontinence, difficulty urinating, dysuria, frequency, hematuria and nocturia.   Musculoskeletal:  Negative for arthralgias, back pain, flank pain, myalgias and neck pain.  Skin:  Negative for itching and rash.  Neurological:  Positive for headaches (occasional) and numbness (right hand). Negative for dizziness.  Hematological:  Does not bruise/bleed easily.  Psychiatric/Behavioral:  Positive for sleep disturbance. Negative for depression and suicidal ideas. The patient is not nervous/anxious.   All other systems reviewed and are negative.    VITALS:   There were no vitals taken for this visit.  Wt Readings from Last 3 Encounters:  08/01/22 132 lb 6.4 oz (60.1 kg)  07/23/22 135 lb 6.4 oz (61.4 kg)  07/12/22 144 lb (65.3 kg)    There is no height or weight on file to calculate BMI.  Performance status (ECOG): 1 - Symptomatic but completely ambulatory  PHYSICAL EXAM:   Physical Exam Vitals and nursing note reviewed. Exam conducted with a chaperone present.  Constitutional:      Appearance: Normal appearance.  Cardiovascular:     Rate and Rhythm: Normal rate and regular rhythm.     Pulses: Normal pulses.     Heart sounds: Normal heart sounds.  Pulmonary:     Effort: Pulmonary effort is normal.     Breath sounds: Normal breath sounds.  Abdominal:     Palpations: Abdomen is soft. There is no hepatomegaly, splenomegaly or mass.     Tenderness: There is no abdominal tenderness.  Musculoskeletal:      Right lower leg: No edema.     Left lower leg: No edema.  Lymphadenopathy:     Cervical: No cervical adenopathy.     Right cervical: No superficial, deep or posterior cervical adenopathy.    Left cervical: No superficial, deep or posterior cervical adenopathy.     Upper Body:     Right upper body: No supraclavicular or axillary adenopathy.     Left upper body: No supraclavicular or axillary adenopathy.  Neurological:     General: No focal deficit present.     Mental Status: She is alert and oriented to person, place, and time.  Psychiatric:        Mood and Affect: Mood normal.        Behavior: Behavior normal.     LABS:      Latest Ref Rng & Units 08/01/2022    8:07 AM 07/23/2022    9:45 AM 07/17/2022   10:40 AM  CBC  WBC 4.0 - 10.5 K/uL 8.5  4.2  9.7  Hemoglobin 12.0 - 15.0 g/dL 8.3  8.3  9.7   Hematocrit 36.0 - 46.0 % 26.0  25.8  29.9   Platelets 150 - 400 K/uL 308  119  156       Latest Ref Rng & Units 08/01/2022    8:07 AM 07/23/2022    9:45 AM 07/17/2022   10:40 AM  CMP  Glucose 70 - 99 mg/dL 127  119  155   BUN 8 - 23 mg/dL 15  8  15    Creatinine 0.44 - 1.00 mg/dL 1.09  0.91  0.82   Sodium 135 - 145 mmol/L 137  137  137   Potassium 3.5 - 5.1 mmol/L 4.1  4.0  3.8   Chloride 98 - 111 mmol/L 101  99  101   CO2 22 - 32 mmol/L 27  28  26    Calcium 8.9 - 10.3 mg/dL 8.5  9.0  8.9   Total Protein 6.5 - 8.1 g/dL 5.9  6.8  6.6   Total Bilirubin 0.3 - 1.2 mg/dL 0.6  0.7  0.8   Alkaline Phos 38 - 126 U/L 119  137  168   AST 15 - 41 U/L 24  22  19    ALT 0 - 44 U/L 25  20  16       No results found for: "CEA1", "CEA" / No results found for: "CEA1", "CEA" No results found for: "PSA1" No results found for: "[id number]" No results found for: "CAN125"  No results found for: "TOTALPROTELP", "ALBUMINELP", "A1GS", "A2GS", "BETS", "BETA2SER", "GAMS", "MSPIKE", "SPEI" No results found for: "TIBC", "FERRITIN", "IRONPCTSAT" No results found for: "LDH"   STUDIES:   ECHOCARDIOGRAM  COMPLETE  Result Date: 07/09/2022    ECHOCARDIOGRAM REPORT   Patient Name:   Marilyn Ford Date of Exam: 07/09/2022 Medical Rec #:  [id number]        Height:       63.0 in Accession #:    [id number]       Weight:       143.0 lb Date of Birth:  12-10-1960        BSA:          1.677 m Patient Age:    61 years         BP:           151/81 mmHg Patient Gender: F                HR:           80 bpm. Exam Location:  Outpatient Procedure: 2D Echo, 3D Echo, Cardiac Doppler, Color Doppler and Strain Analysis Indications:    chemo  History:        Patient has prior history of Echocardiogram examinations.                 Stroke; Risk Factors:Diabetes and Hypertension.  Sonographer:    Phineas Douglas Referring Phys: [phone] [place]  1. Left ventricular ejection fraction, by estimation, is 60 to 65%. The left ventricle has normal function. The left ventricle has no regional wall motion abnormalities. Left ventricular diastolic parameters are consistent with Grade I diastolic dysfunction (impaired relaxation).  2. Right ventricular systolic function is normal. The right ventricular size is normal. Tricuspid regurgitation signal is inadequate for assessing PA pressure.  3. The mitral valve is normal in structure. No evidence of mitral valve regurgitation.  4. The aortic valve is tricuspid. Aortic valve regurgitation is not visualized. No aortic  stenosis is present.  5. The inferior vena cava is normal in size with greater than 50% respiratory variability, suggesting right atrial pressure of 3 mmHg. Comparison(s): No prior Echocardiogram. FINDINGS  Left Ventricle: Left ventricular ejection fraction, by estimation, is 60 to 65%. The left ventricle has normal function. The left ventricle has no regional wall motion abnormalities. The global longitudinal strain is near normal despite suboptimal segment tracking (-17.8% but likely underestimated due to suboptimal tracking). 3D left ventricular ejection fraction  analysis performed but not reported based on interpreter judgement due to suboptimal tracking. The left ventricular internal cavity size  was normal in size. There is no concentric left ventricular hypertrophy. Left ventricular diastolic parameters are consistent with Grade I diastolic dysfunction (impaired relaxation). Right Ventricle: The right ventricular size is normal. No increase in right ventricular wall thickness. Right ventricular systolic function is normal. Tricuspid regurgitation signal is inadequate for assessing PA pressure. Left Atrium: Left atrial size was normal in size. Right Atrium: Right atrial size was normal in size. Pericardium: There is no evidence of pericardial effusion. Mitral Valve: The mitral valve is normal in structure. No evidence of mitral valve regurgitation. Tricuspid Valve: The tricuspid valve is normal in structure. Tricuspid valve regurgitation is not demonstrated. Aortic Valve: The aortic valve is tricuspid. Aortic valve regurgitation is not visualized. No aortic stenosis is present. Pulmonic Valve: The pulmonic valve was normal in structure. Pulmonic valve regurgitation is not visualized. Aorta: The aortic root and ascending aorta are structurally normal, with no evidence of dilitation. Venous: The inferior vena cava is normal in size with greater than 50% respiratory variability, suggesting right atrial pressure of 3 mmHg. IAS/Shunts: The atrial septum is grossly normal.  LEFT VENTRICLE PLAX 2D LVIDd:         3.80 cm     Diastology LVIDs:         2.40 cm     LV e' medial:    5.55 cm/s LV PW:         1.10 cm     LV E/e' medial:  13.0 LV IVS:        1.10 cm     LV e' lateral:   6.64 cm/s LVOT diam:     1.90 cm     LV E/e' lateral: 10.9 LV SV:         50 LV SV Index:   30          2D Longitudinal Strain LVOT Area:     2.84 cm    2D Strain GLS Avg:     -17.8 %  LV Volumes (MOD) LV vol d, MOD A2C: 83.9 ml 3D Volume EF: LV vol d, MOD A4C: 86.8 ml 3D EF:        55 % LV vol s, MOD  A2C: 28.9 ml LV EDV:       121 ml LV vol s, MOD A4C: 32.5 ml LV ESV:       54 ml LV SV MOD A2C:     55.0 ml LV SV:        67 ml LV SV MOD A4C:     86.8 ml LV SV MOD BP:      53.4 ml RIGHT VENTRICLE             IVC RV Basal diam:  3.40 cm     IVC diam: 1.80 cm RV S prime:     14.00 cm/s TAPSE (M-mode): 1.7 cm LEFT ATRIUM  Index        RIGHT ATRIUM           Index LA diam:        3.30 cm 1.97 cm/m   RA Area:     11.70 cm LA Vol (A2C):   37.3 ml 22.25 ml/m  RA Volume:   22.10 ml  13.18 ml/m LA Vol (A4C):   27.2 ml 16.22 ml/m LA Biplane Vol: 33.2 ml 19.80 ml/m  AORTIC VALVE LVOT Vmax:   88.30 cm/s LVOT Vmean:  55.300 cm/s LVOT VTI:    0.176 m  AORTA Ao Root diam: 2.70 cm Ao Asc diam:  3.20 cm MITRAL VALVE MV Area (PHT): 3.61 cm    SHUNTS MV Decel Time: 210 msec    Systemic VTI:  0.18 m MV E velocity: 72.20 cm/s  Systemic Diam: 1.90 cm MV A velocity: 88.10 cm/s MV E/A ratio:  0.82 Gwyndolyn Kaufman MD Electronically signed by Gwyndolyn Kaufman MD Signature Date/Time: 07/09/2022/10:52:49 AM    Final

## 2022-08-01 NOTE — Patient Instructions (Signed)
[facility]  Discharge Instructions: Thank you for choosing [facility] to provide your oncology and hematology care.  If you have a lab appointment with the [facility], please come in thru the Main Entrance and check in at the main information desk.  Wear comfortable clothing and clothing appropriate for easy access to any Portacath or PICC line.   We strive to give you quality time with your provider. You may need to reschedule your appointment if you arrive late (15 or more minutes).  Arriving late affects you and other patients whose appointments are after yours.  Also, if you miss three or more appointments without notifying the office, you may be dismissed from the clinic at the provider's discretion.      For prescription refill requests, have your pharmacy contact our office and allow 72 hours for refills to be completed.    Today you received the following chemotherapy and/or immunotherapy agents Docorubicin, Keytruda, Cytoxan.  Cyclophosphamide Capsules or Tablets What is this medication? CYCLOPHOSPHAMIDE (sye kloe FOSS fa mide) treats some types of cancer. It works by slowing down the growth of cancer cells. It may also be used to treat nephrotic syndrome, a type of kidney disease. It works by slowing down an overactive immune system. This medicine may be used for other purposes; ask your health care provider or pharmacist if you have questions. COMMON BRAND NAME(S): Cytoxan What should I tell my care team before I take this medication? They need to know if you have any of these conditions: Heart disease Irregular heartbeat or rhythm Infection Kidney problems Liver disease Low blood cell levels (white cells, platelets, or red blood cells) Lung disease Previous radiation Trouble when passing urine An unusual or allergic reaction to cyclophosphamide, other medications, foods, dyes, or preservatives Pregnant or trying to get  pregnant Breast-feeding How should I use this medication? Take this medication by mouth with water. Take it as directed on the prescription label at the same time every morning. Do not cut, crush, or chew this medication. Swallow the tablets whole. Keep taking it unless your care team tells you to stop. There may be unused or extra doses in the bottle after you finish the dosing cycle. Talk to your care team if you have questions about your dose. Handling this medication may be harmful. Wear gloves while touching the medication or bottle. Talk to your care team about how to handle this medication. Special instructions may apply. Talk to your care team about the use of this medication in children. Special care may be needed. Overdosage: If you think you have taken too much of this medicine contact a poison control center or emergency room at once. NOTE: This medicine is only for you. Do not share this medicine with others. What if I miss a dose? If you miss a dose, take it as soon as you can. If it is almost time for your next dose, take only that dose. Do not take double or extra doses. What may interact with this medication? Amiodarone Amphotericin B Azathioprine Certain antivirals for HIV or hepatitis Certain medicines for blood pressure, such as enalapril, lisinopril, quinapril Cyclosporine Diuretics Etanercept Indomethacin Medications that relax muscles Metronidazole Natalizumab Tamoxifen Warfarin This list may not describe all possible interactions. Give your health care provider a list of all the medicines, herbs, non-prescription drugs, or dietary supplements you use. Also tell them if you smoke, drink alcohol, or use illegal drugs. Some items may interact with your  medicine. What should I watch for while using this medication? This medication may make you feel generally unwell. This is not uncommon as chemotherapy can affect healthy cells as well as cancer cells. Report any side  effects. Continue your course of treatment even though you feel ill unless your care team tells you to stop. You may need blood work while you are taking this medication. This medication may increase your risk of getting an infection. Call your care team for advice if you get a fever, chills, sore throat, or other symptoms of a cold or flu. Do not treat yourself. Try to avoid being around people who are sick. Avoid taking medications that contain aspirin, acetaminophen, ibuprofen, naproxen, or ketoprofen unless instructed by your care team. These medications may hide a fever. Be careful brushing or flossing your teeth or using a toothpick because you may get an infection or bleed more easily. If you have any dental work done, tell your dentist you are receiving this information. Drink water or other fluids as directed. Urinate often, even at night. Talk to your care team if you wish to become pregnant or think you might be pregnant. This medication can cause serious birth defects if taken during pregnancy or for 1 year after stopping therapy. A negative pregnancy test is required before starting this medication. A reliable form of contraception is recommended while taking this medication and for 1 year after stopping therapy. Talk to your care team about reliable contraception. Use a condom during sex and for 4 months after stopping therapy. Tell your care team right away if you think your partner might be pregnant. This medication can cause serious birth defects. Do not breast-feed while taking this medication or for 1 week after stopping it. This medication may cause infertility. Talk to your care team if you are concerned about your fertility. Talk to your care team about your risk of cancer. You may be more at risk for certain types of cancer if you take this medication. What side effects may I notice from receiving this medication? Side effects that you should report to your care team as soon as  possible: Allergic reactions--skin rash, itching, hives, swelling of the face, lips, tongue, or throat Dry cough, shortness of breath or trouble breathing Heart failure--shortness of breath, swelling of the ankles, feet, or hands, sudden weight gain, unusual weakness or fatigue Heart muscle inflammation--unusual weakness or fatigue, shortness of breath, chest pain, fast or irregular heartbeat, dizziness, swelling of the ankles, feet, or hands Heart rhythm changes--fast or irregular heartbeat, dizziness, feeling faint or lightheaded, chest pain, trouble breathing Infection--fever, chills, cough, sore throat, wounds that don't heal, pain or trouble when passing urine, general feeling of discomfort or being unwell Kidney injury--decrease in the amount of urine, swelling of the ankles, hands, or feet Liver injury--right upper belly pain, loss of appetite, nausea, light-colored stool, dark yellow or brown urine, yellowing skin or eyes, unusual weakness or fatigue Low red blood cell level--unusual weakness or fatigue, dizziness, headache, trouble breathing Low sodium level--muscle weakness, fatigue, dizziness, headache, confusion Red or dark brown urine Unusual bruising or bleeding Side effects that usually do not require medical attention (report to your care team if they continue or are bothersome): Hair loss Irregular menstrual cycles or spotting Loss of appetite Nausea Pain, redness, or swelling with sores inside the mouth or throat Vomiting This list may not describe all possible side effects. Call your doctor for medical advice about side effects. You may report side  effects to FDA at 1-800-FDA-1088. Where should I keep my medication? Keep out of the reach of children and pets. Store at room temperature between 20 and 25 degrees C (68 and 77 degrees F). Get rid of any unused medication after the expiration date. To get rid of medications that are no longer needed or have expired: Take the  medication to a medication take back program. Check with your pharmacy or local law enforcement to find a location. If you cannot return the medication, ask your pharmacist or care team how to get rid of this medication safely. NOTE: This sheet is a summary. It may not cover all possible information. If you have questions about this medicine, talk to your doctor, pharmacist, or health care provider.  2023 Elsevier/Gold Standard (2021-06-13 00:00:00) Pembrolizumab Injection What is this medication? PEMBROLIZUMAB (PEM broe LIZ ue mab) treats some types of cancer. It works by helping your immune system slow or stop the spread of cancer cells. It is a monoclonal antibody. This medicine may be used for other purposes; ask your health care provider or pharmacist if you have questions. COMMON BRAND NAME(S): Keytruda What should I tell my care team before I take this medication? They need to know if you have any of these conditions: Allogeneic stem cell transplant (uses someone else's stem cells) Autoimmune diseases, such as Crohn disease, ulcerative colitis, lupus History of chest radiation Nervous system problems, such as Guillain-Barre syndrome, myasthenia gravis Organ transplant An unusual or allergic reaction to pembrolizumab, other medications, foods, dyes, or preservatives Pregnant or trying to get pregnant Breast-feeding How should I use this medication? This medication is injected into a vein. It is given by your care team in a hospital or clinic setting. A special MedGuide will be given to you before each treatment. Be sure to read this information carefully each time. Talk to your care team about the use of this medication in children. While it may be prescribed for children as young as 6 months for selected conditions, precautions do apply. Overdosage: If you think you have taken too much of this medicine contact a poison control center or emergency room at once. NOTE: This medicine is  only for you. Do not share this medicine with others. What if I miss a dose? Keep appointments for follow-up doses. It is important not to miss your dose. Call your care team if you are unable to keep an appointment. What may interact with this medication? Interactions have not been studied. This list may not describe all possible interactions. Give your health care provider a list of all the medicines, herbs, non-prescription drugs, or dietary supplements you use. Also tell them if you smoke, drink alcohol, or use illegal drugs. Some items may interact with your medicine. What should I watch for while using this medication? Your condition will be monitored carefully while you are receiving this medication. You may need blood work while taking this medication. This medication may cause serious skin reactions. They can happen weeks to months after starting the medication. Contact your care team right away if you notice fevers or flu-like symptoms with a rash. The rash may be red or purple and then turn into blisters or peeling of the skin. You may also notice a red rash with swelling of the face, lips, or lymph nodes in your neck or under your arms. Tell your care team right away if you have any change in your eyesight. Talk to your care team if you may be pregnant.  Serious birth defects can occur if you take this medication during pregnancy and for 4 months after the last dose. You will need a negative pregnancy test before starting this medication. Contraception is recommended while taking this medication and for 4 months after the last dose. Your care team can help you find the option that works for you. Do not breastfeed while taking this medication and for 4 months after the last dose. What side effects may I notice from receiving this medication? Side effects that you should report to your care team as soon as possible: Allergic reactions--skin rash, itching, hives, swelling of the face, lips,  tongue, or throat Dry cough, shortness of breath or trouble breathing Eye pain, redness, irritation, or discharge with blurry or decreased vision Heart muscle inflammation--unusual weakness or fatigue, shortness of breath, chest pain, fast or irregular heartbeat, dizziness, swelling of the ankles, feet, or hands Hormone gland problems--headache, sensitivity to light, unusual weakness or fatigue, dizziness, fast or irregular heartbeat, increased sensitivity to cold or heat, excessive sweating, constipation, hair loss, increased thirst or amount of urine, tremors or shaking, irritability Infusion reactions--chest pain, shortness of breath or trouble breathing, feeling faint or lightheaded Kidney injury (glomerulonephritis)--decrease in the amount of urine, red or dark brown urine, foamy or bubbly urine, swelling of the ankles, hands, or feet Liver injury--right upper belly pain, loss of appetite, nausea, light-colored stool, dark yellow or brown urine, yellowing skin or eyes, unusual weakness or fatigue Pain, tingling, or numbness in the hands or feet, muscle weakness, change in vision, confusion or trouble speaking, loss of balance or coordination, trouble walking, seizures Rash, fever, and swollen lymph nodes Redness, blistering, peeling, or loosening of the skin, including inside the mouth Sudden or severe stomach pain, bloody diarrhea, fever, nausea, vomiting Side effects that usually do not require medical attention (report to your care team if they continue or are bothersome): Bone, joint, or muscle pain Diarrhea Fatigue Loss of appetite Nausea Skin rash This list may not describe all possible side effects. Call your doctor for medical advice about side effects. You may report side effects to FDA at 1-800-FDA-1088. Where should I keep my medication? This medication is given in a hospital or clinic. It will not be stored at home. NOTE: This sheet is a summary. It may not cover all possible  information. If you have questions about this medicine, talk to your doctor, pharmacist, or health care provider.  2023 Elsevier/Gold Standard (2021-09-05 00:00:00) Doxorubicin Injection What is this medication? DOXORUBICIN (dox oh ROO bi sin) treats some types of cancer. It works by slowing down the growth of cancer cells. This medicine may be used for other purposes; ask your health care provider or pharmacist if you have questions. COMMON BRAND NAME(S): Adriamycin, Adriamycin PFS, Adriamycin RDF, Rubex What should I tell my care team before I take this medication? They need to know if you have any of these conditions: Heart disease History of low blood cell levels caused by a medication Liver disease Recent or ongoing radiation An unusual or allergic reaction to doxorubicin, other medications, foods, dyes, or preservatives If you or your partner are pregnant or trying to get pregnant Breast-feeding How should I use this medication? This medication is injected into a vein. It is given by your care team in a hospital or clinic setting. Talk to your care team about the use of this medication in children. Special care may be needed. Overdosage: If you think you have taken too much of this  medicine contact a poison control center or emergency room at once. NOTE: This medicine is only for you. Do not share this medicine with others. What if I miss a dose? Keep appointments for follow-up doses. It is important not to miss your dose. Call your care team if you are unable to keep an appointment. What may interact with this medication? 6-mercaptopurine Paclitaxel Phenytoin St. John's wort Trastuzumab Verapamil This list may not describe all possible interactions. Give your health care provider a list of all the medicines, herbs, non-prescription drugs, or dietary supplements you use. Also tell them if you smoke, drink alcohol, or use illegal drugs. Some items may interact with your  medicine. What should I watch for while using this medication? Your condition will be monitored carefully while you are receiving this medication. You may need blood work while taking this medication. This medication may make you feel generally unwell. This is not uncommon as chemotherapy can affect healthy cells as well as cancer cells. Report any side effects. Continue your course of treatment even though you feel ill unless your care team tells you to stop. There is a maximum amount of this medication you should receive throughout your life. The amount depends on the medical condition being treated and your overall health. Your care team will watch how much of this medication you receive. Tell your care team if you have taken this medication before. Your urine may turn red for a few days after your dose. This is not blood. If your urine is dark or brown, call your care team. In some cases, you may be given additional medications to help with side effects. Follow all directions for their use. This medication may increase your risk of getting an infection. Call your care team for advice if you get a fever, chills, sore throat, or other symptoms of a cold or flu. Do not treat yourself. Try to avoid being around people who are sick. This medication may increase your risk to bruise or bleed. Call your care team if you notice any unusual bleeding. Talk to your care team about your risk of cancer. You may be more at risk for certain types of cancers if you take this medication. You should make sure that you get enough Coenzyme Q10 while you are taking this medication. Discuss the foods you eat and the vitamins you take with your care team. Talk to your care team if you or your partner may be pregnant. Serious birth defects can occur if you take this medication during pregnancy and for 6 months after the last dose. Contraception is recommended while taking this medication and for 6 months after the last dose.  Your care team can help you find the option that works for you. If your partner can get pregnant, use a condom while taking this medication and for 6 months after the last dose. Do not breastfeed while taking this medication. This medication may cause infertility. Talk to your care team if you are concerned about your fertility. What side effects may I notice from receiving this medication? Side effects that you should report to your care team as soon as possible: Allergic reactions--skin rash, itching, hives, swelling of the face, lips, tongue, or throat Heart failure--shortness of breath, swelling of the ankles, feet, or hands, sudden weight gain, unusual weakness or fatigue Heart rhythm changes--fast or irregular heartbeat, dizziness, feeling faint or lightheaded, chest pain, trouble breathing Infection--fever, chills, cough, sore throat, wounds that don't heal, pain or trouble when  passing urine, general feeling of discomfort or being unwell Low red blood cell level--unusual weakness or fatigue, dizziness, headache, trouble breathing Painful swelling, warmth, or redness of the skin, blisters or sores at the infusion site Unusual bruising or bleeding Side effects that usually do not require medical attention (report to your care team if they continue or are bothersome): Diarrhea Hair loss Nausea Pain, redness, or swelling with sores inside the mouth or throat Red urine This list may not describe all possible side effects. Call your doctor for medical advice about side effects. You may report side effects to FDA at 1-800-FDA-1088. Where should I keep my medication? This medication is given in a hospital or clinic. It will not be stored at home. NOTE: This sheet is a summary. It may not cover all possible information. If you have questions about this medicine, talk to your doctor, pharmacist, or health care provider.  2023 Elsevier/Gold Standard (2021-08-30 00:00:00)       To help prevent  nausea and vomiting after your treatment, we encourage you to take your nausea medication as directed.  BELOW ARE SYMPTOMS THAT SHOULD BE REPORTED IMMEDIATELY: *FEVER GREATER THAN 100.4 F (38 C) OR HIGHER *CHILLS OR SWEATING *NAUSEA AND VOMITING THAT IS NOT CONTROLLED WITH YOUR NAUSEA MEDICATION *UNUSUAL SHORTNESS OF BREATH *UNUSUAL BRUISING OR BLEEDING *URINARY PROBLEMS (pain or burning when urinating, or frequent urination) *BOWEL PROBLEMS (unusual diarrhea, constipation, pain near the anus) TENDERNESS IN MOUTH AND THROAT WITH OR WITHOUT PRESENCE OF ULCERS (sore throat, sores in mouth, or a toothache) UNUSUAL RASH, SWELLING OR PAIN  UNUSUAL VAGINAL DISCHARGE OR ITCHING   Items with * indicate a potential emergency and should be followed up as soon as possible or go to the Emergency Department if any problems should occur.  Please show the CHEMOTHERAPY ALERT CARD or IMMUNOTHERAPY ALERT CARD at check-in to the Emergency Department and triage nurse.  Should you have questions after your visit or need to cancel or reschedule your appointment, please contact [facility] [phone]  and follow the prompts.  Office hours are 8:00 a.m. to 4:30 p.m. Monday - Friday. Please note that voicemails left after 4:00 p.m. may not be returned until the following business day.  We are closed weekends and major holidays. You have access to a nurse at all times for urgent questions. Please call the main number to the clinic [phone] and follow the prompts.  For any non-urgent questions, you may also contact your provider using MyChart. We now offer e-Visits for anyone 10 and older to request care online for non-urgent symptoms. For details visit [email or web address].   Also download the MyChart app! Go to the app store, search "MyChart", open the app, select [facility], and log in with your MyChart username and password.

## 2022-08-01 NOTE — Patient Instructions (Addendum)
[place] at [facility] Discharge Instructions   You were seen and examined today by Dr. Delton Coombes.  He reviewed the results of your lab work which are normal/stable.   We will proceed with your treatment today.   Increase your Boost/Ensure to 2 per day. Continue to eat your meals as you have been.   Return as scheduled.    Thank you for choosing [place] at [facility] to provide your oncology and hematology care.  To afford each patient quality time with our provider, please arrive at least 15 minutes before your scheduled appointment time.   If you have a lab appointment with the [facility] please come in thru the Main Entrance and check in at the main information desk.  You need to re-schedule your appointment should you arrive 10 or more minutes late.  We strive to give you quality time with our providers, and arriving late affects you and other patients whose appointments are after yours.  Also, if you no show three or more times for appointments you may be dismissed from the clinic at the providers discretion.     Again, thank you for choosing [facility].  Our hope is that these requests will decrease the amount of time that you wait before being seen by our physicians.       _____________________________________________________________  Should you have questions after your visit to [facility], please contact our office at [phone] and follow the prompts.  Our office hours are 8:00 a.m. and 4:30 p.m. Monday - Friday.  Please note that voicemails left after 4:00 p.m. may not be returned until the following business day.  We are closed weekends and major holidays.  You do have access to a nurse 24-7, just call the main number to the clinic [phone] and do not press any options, hold on the line and a nurse will answer the phone.    For prescription refill requests, have your pharmacy contact our  office and allow 72 hours.    Due to Covid, you will need to wear a mask upon entering the hospital. If you do not have a mask, a mask will be given to you at the Main Entrance upon arrival. For doctor visits, patients may have 1 support person age 93 or older with them. For treatment visits, patients can not have anyone with them due to social distancing guidelines and our immunocompromised population.

## 2022-08-01 NOTE — Progress Notes (Signed)
Patient has been examined by Dr. Katragadda. Vital signs and labs have been reviewed by MD - ANC, Creatinine, LFTs, hemoglobin, and platelets are within treatment parameters per M.D. - pt may proceed with treatment.  Primary RN and pharmacy notified.  

## 2022-08-01 NOTE — Progress Notes (Signed)
Patient with >10% weight change.  New BSA: 1.42m2  New doses:  Doxorubicin 60 mg/m2 = 98 mg Cyclophosphamide 600 mg/m2 = 1000 mg  Ok per Dr Rhys Martini, PharmD

## 2022-08-02 NOTE — Telephone Encounter (Signed)
Received notification from [employer] regarding approval for TRULICITY 4.5MG . Patient assistance approved from 07/31/22 to 05/07/23.  Medication will ship to patients home  Phone: [phone]

## 2022-08-03 ENCOUNTER — Inpatient Hospital Stay: Payer: No Typology Code available for payment source

## 2022-08-03 VITALS — BP 117/74 | HR 93 | Temp 98.0°F | Resp 18

## 2022-08-03 DIAGNOSIS — Z5112 Encounter for antineoplastic immunotherapy: Secondary | ICD-10-CM | POA: Diagnosis not present

## 2022-08-03 DIAGNOSIS — C50811 Malignant neoplasm of overlapping sites of right female breast: Secondary | ICD-10-CM

## 2022-08-03 MED ORDER — PEGFILGRASTIM-CBQV 6 MG/0.6ML ~~LOC~~ SOSY
6.0000 mg | PREFILLED_SYRINGE | Freq: Once | SUBCUTANEOUS | Status: AC
  Administered 2022-08-03: 6 mg via SUBCUTANEOUS
  Filled 2022-08-03: qty 0.6

## 2022-08-03 NOTE — Patient Instructions (Signed)
[place]  Discharge Instructions: Thank you for choosing [facility] to provide your oncology and hematology care.  If you have a lab appointment with the [facility], please come in thru the Main Entrance and check in at the main information desk.  Wear comfortable clothing and clothing appropriate for easy access to any Portacath or PICC line.   We strive to give you quality time with your provider. You may need to reschedule your appointment if you arrive late (15 or more minutes).  Arriving late affects you and other patients whose appointments are after yours.  Also, if you miss three or more appointments without notifying the office, you may be dismissed from the clinic at the provider's discretion.      For prescription refill requests, have your pharmacy contact our office and allow 72 hours for refills to be completed.    Today you received the following udenyca injection   To help prevent nausea and vomiting after your treatment, we encourage you to take your nausea medication as directed.  BELOW ARE SYMPTOMS THAT SHOULD BE REPORTED IMMEDIATELY: *FEVER GREATER THAN 100.4 F (38 C) OR HIGHER *CHILLS OR SWEATING *NAUSEA AND VOMITING THAT IS NOT CONTROLLED WITH YOUR NAUSEA MEDICATION *UNUSUAL SHORTNESS OF BREATH *UNUSUAL BRUISING OR BLEEDING *URINARY PROBLEMS (pain or burning when urinating, or frequent urination) *BOWEL PROBLEMS (unusual diarrhea, constipation, pain near the anus) TENDERNESS IN MOUTH AND THROAT WITH OR WITHOUT PRESENCE OF ULCERS (sore throat, sores in mouth, or a toothache) UNUSUAL RASH, SWELLING OR PAIN  UNUSUAL VAGINAL DISCHARGE OR ITCHING   Items with * indicate a potential emergency and should be followed up as soon as possible or go to the Emergency Department if any problems should occur.  Please show the CHEMOTHERAPY ALERT CARD or IMMUNOTHERAPY ALERT CARD at check-in to the Emergency Department and triage  nurse.  Should you have questions after your visit or need to cancel or reschedule your appointment, please contact [facility] [phone]  and follow the prompts.  Office hours are 8:00 a.m. to 4:30 p.m. Monday - Friday. Please note that voicemails left after 4:00 p.m. may not be returned until the following business day.  We are closed weekends and major holidays. You have access to a nurse at all times for urgent questions. Please call the main number to the clinic [phone] and follow the prompts.  For any non-urgent questions, you may also contact your provider using MyChart. We now offer e-Visits for anyone 48 and older to request care online for non-urgent symptoms. For details visit [email or web address].   Also download the MyChart app! Go to the app store, search "MyChart", open the app, select [place] [facility], and log in with your MyChart username and password.

## 2022-08-08 ENCOUNTER — Other Ambulatory Visit: Payer: Self-pay | Admitting: Family Medicine

## 2022-08-08 DIAGNOSIS — I69351 Hemiplegia and hemiparesis following cerebral infarction affecting right dominant side: Secondary | ICD-10-CM

## 2022-08-10 ENCOUNTER — Other Ambulatory Visit: Payer: Self-pay

## 2022-08-10 MED ORDER — ALUMINUM-MAGNESIUM-SIMETHICONE 200-200-20 MG/5ML PO SUSP: 30.0000 mL | Freq: Three times a day (TID) | ORAL | 2 refills | Status: DC

## 2022-08-10 MED ORDER — LIDOCAINE VISCOUS HCL 2 % MT SOLN: 15.0000 mL | Freq: Four times a day (QID) | OROMUCOSAL | 1 refills | Status: DC | PRN

## 2022-08-22 ENCOUNTER — Inpatient Hospital Stay: Payer: No Typology Code available for payment source

## 2022-08-22 ENCOUNTER — Inpatient Hospital Stay: Payer: No Typology Code available for payment source | Attending: Hematology and Oncology

## 2022-08-22 ENCOUNTER — Inpatient Hospital Stay (HOSPITAL_BASED_OUTPATIENT_CLINIC_OR_DEPARTMENT_OTHER): Payer: No Typology Code available for payment source | Admitting: Hematology

## 2022-08-22 VITALS — BP 103/67 | HR 91 | Temp 96.9°F | Resp 18 | Wt 128.8 lb

## 2022-08-22 VITALS — BP 116/59 | HR 82 | Temp 98.1°F | Resp 18

## 2022-08-22 DIAGNOSIS — C50811 Malignant neoplasm of overlapping sites of right female breast: Secondary | ICD-10-CM | POA: Diagnosis not present

## 2022-08-22 DIAGNOSIS — Z171 Estrogen receptor negative status [ER-]: Secondary | ICD-10-CM

## 2022-08-22 DIAGNOSIS — C9000 Multiple myeloma not having achieved remission: Secondary | ICD-10-CM

## 2022-08-22 DIAGNOSIS — Z95828 Presence of other vascular implants and grafts: Secondary | ICD-10-CM

## 2022-08-22 DIAGNOSIS — Z79899 Other long term (current) drug therapy: Secondary | ICD-10-CM | POA: Insufficient documentation

## 2022-08-22 LAB — CBC WITH DIFFERENTIAL/PLATELET
Abs Immature Granulocytes: 0.05 10*3/uL (ref 0.00–0.07)
Basophils Absolute: 0 10*3/uL (ref 0.0–0.1)
Basophils Relative: 1 %
Eosinophils Absolute: 0 10*3/uL (ref 0.0–0.5)
Eosinophils Relative: 0 %
HCT: 20.4 % — ABNORMAL LOW (ref 36.0–46.0)
Hemoglobin: 6.4 g/dL — CL (ref 12.0–15.0)
Immature Granulocytes: 2 %
Lymphocytes Relative: 16 %
Lymphs Abs: 0.5 10*3/uL — ABNORMAL LOW (ref 0.7–4.0)
MCH: 33.9 pg (ref 26.0–34.0)
MCHC: 31.4 g/dL (ref 30.0–36.0)
MCV: 107.9 fL — ABNORMAL HIGH (ref 80.0–100.0)
Monocytes Absolute: 0.4 10*3/uL (ref 0.1–1.0)
Monocytes Relative: 12 %
Neutro Abs: 2.4 10*3/uL (ref 1.7–7.7)
Neutrophils Relative %: 69 %
Platelets: 164 10*3/uL (ref 150–400)
RBC: 1.89 MIL/uL — ABNORMAL LOW (ref 3.87–5.11)
RDW: 24.6 % — ABNORMAL HIGH (ref 11.5–15.5)
WBC: 3.4 10*3/uL — ABNORMAL LOW (ref 4.0–10.5)
nRBC: 0 % (ref 0.0–0.2)

## 2022-08-22 LAB — COMPREHENSIVE METABOLIC PANEL
ALT: 19 U/L (ref 0–44)
AST: 22 U/L (ref 15–41)
Albumin: 3.3 g/dL — ABNORMAL LOW (ref 3.5–5.0)
Alkaline Phosphatase: 116 U/L (ref 38–126)
Anion gap: 10 (ref 5–15)
BUN: 14 mg/dL (ref 8–23)
CO2: 26 mmol/L (ref 22–32)
Calcium: 9.2 mg/dL (ref 8.9–10.3)
Chloride: 100 mmol/L (ref 98–111)
Creatinine, Ser: 1.23 mg/dL — ABNORMAL HIGH (ref 0.44–1.00)
GFR, Estimated: 50 mL/min — ABNORMAL LOW (ref >60)
Glucose, Bld: 102 mg/dL — ABNORMAL HIGH (ref 70–99)
Potassium: 4.2 mmol/L (ref 3.5–5.1)
Sodium: 136 mmol/L (ref 135–145)
Total Bilirubin: 0.8 mg/dL (ref 0.3–1.2)
Total Protein: 6.3 g/dL — ABNORMAL LOW (ref 6.5–8.1)

## 2022-08-22 LAB — MAGNESIUM: Magnesium: 2 mg/dL (ref 1.7–2.4)

## 2022-08-22 LAB — BPAM RBC: ISSUE DATE / TIME: 202404171215

## 2022-08-22 LAB — TYPE AND SCREEN
ABO/RH(D): A NEG
Antibody Screen: NEGATIVE
Unit division: 0

## 2022-08-22 LAB — PREPARE RBC (CROSSMATCH)

## 2022-08-22 LAB — SAMPLE TO BLOOD BANK

## 2022-08-22 MED ORDER — SODIUM CHLORIDE 0.9% FLUSH
10.0000 mL | INTRAVENOUS | Status: AC | PRN
  Administered 2022-08-22: 10 mL

## 2022-08-22 MED ORDER — DIPHENHYDRAMINE HCL 25 MG PO CAPS
25.0000 mg | ORAL_CAPSULE | Freq: Once | ORAL | Status: AC
  Administered 2022-08-22: 25 mg via ORAL
  Filled 2022-08-22: qty 1

## 2022-08-22 MED ORDER — CEPHALEXIN 500 MG PO CAPS: 500.0000 mg | ORAL_CAPSULE | Freq: Three times a day (TID) | ORAL | 0 refills | Status: DC

## 2022-08-22 MED ORDER — LORAZEPAM 1 MG PO TABS
1.0000 mg | ORAL_TABLET | Freq: Once | ORAL | Status: AC
  Administered 2022-08-22: 1 mg via ORAL
  Filled 2022-08-22: qty 1

## 2022-08-22 MED ORDER — HEPARIN SOD (PORK) LOCK FLUSH 100 UNIT/ML IV SOLN
500.0000 [IU] | Freq: Once | INTRAVENOUS | Status: AC
  Administered 2022-08-22: 500 [IU] via INTRAVENOUS

## 2022-08-22 MED ORDER — SODIUM CHLORIDE 0.9% FLUSH
10.0000 mL | Freq: Once | INTRAVENOUS | Status: AC
  Administered 2022-08-22: 10 mL via INTRAVENOUS

## 2022-08-22 MED ORDER — AMOXICILLIN-POT CLAVULANATE 875-125 MG PO TABS: 1.0000 | ORAL_TABLET | Freq: Two times a day (BID) | ORAL | 0 refills | Status: DC

## 2022-08-22 MED ORDER — MAGNESIUM SULFATE 2 GM/50ML IV SOLN
2.0000 g | Freq: Once | INTRAVENOUS | Status: AC
  Administered 2022-08-22: 2 g via INTRAVENOUS
  Filled 2022-08-22: qty 50

## 2022-08-22 MED ORDER — POTASSIUM CHLORIDE IN NACL 20-0.9 MEQ/L-% IV SOLN
INTRAVENOUS | Status: AC
  Filled 2022-08-22 (×2): qty 1000

## 2022-08-22 MED ORDER — ACETAMINOPHEN 325 MG PO TABS
650.0000 mg | ORAL_TABLET | Freq: Once | ORAL | Status: AC
  Administered 2022-08-22: 650 mg via ORAL
  Filled 2022-08-22: qty 2

## 2022-08-22 MED ORDER — SODIUM CHLORIDE 0.9% IV SOLUTION
250.0000 mL | Freq: Once | INTRAVENOUS | Status: AC
  Administered 2022-08-22: 250 mL via INTRAVENOUS

## 2022-08-22 NOTE — Progress Notes (Signed)
CRITICAL VALUE STICKER  CRITICAL VALUE: Hgb 6.4  RECEIVER (on-site recipient of call): A. Dareen Piano, RN  DATE & TIME NOTIFIED: 08/22/2022 at 0926  MD NOTIFIED: Ellin Saba  RESPONSE:  Transfuse 2 units PRBC

## 2022-08-22 NOTE — Patient Instructions (Signed)
[facility]  Discharge Instructions: Thank you for choosing [facility] to provide your oncology and hematology care.  If you have a lab appointment with the Cancer Center - please note that after April 8th, 2024, all labs will be drawn in the cancer center.  You do not have to check in or register with the main entrance as you have in the past but will complete your check-in in the cancer center.  Wear comfortable clothing and clothing appropriate for easy access to any Portacath or PICC line.   We strive to give you quality time with your provider. You may need to reschedule your appointment if you arrive late (15 or more minutes).  Arriving late affects you and other patients whose appointments are after yours.  Also, if you miss three or more appointments without notifying the office, you may be dismissed from the clinic at the provider's discretion.      For prescription refill requests, have your pharmacy contact our office and allow 72 hours for refills to be completed.    Today you received the following chemotherapy and/or immunotherapy agents 2 UPRBC/House fluids      To help prevent nausea and vomiting after your treatment, we encourage you to take your nausea medication as directed.  BELOW ARE SYMPTOMS THAT SHOULD BE REPORTED IMMEDIATELY: *FEVER GREATER THAN 100.4 F (38 C) OR HIGHER *CHILLS OR SWEATING *NAUSEA AND VOMITING THAT IS NOT CONTROLLED WITH YOUR NAUSEA MEDICATION *UNUSUAL SHORTNESS OF BREATH *UNUSUAL BRUISING OR BLEEDING *URINARY PROBLEMS (pain or burning when urinating, or frequent urination) *BOWEL PROBLEMS (unusual diarrhea, constipation, pain near the anus) TENDERNESS IN MOUTH AND THROAT WITH OR WITHOUT PRESENCE OF ULCERS (sore throat, sores in mouth, or a toothache) UNUSUAL RASH, SWELLING OR PAIN  UNUSUAL VAGINAL DISCHARGE OR ITCHING   Items with * indicate a potential emergency and should be followed up as soon as possible or  go to the Emergency Department if any problems should occur.  Please show the CHEMOTHERAPY ALERT CARD or IMMUNOTHERAPY ALERT CARD at check-in to the Emergency Department and triage nurse.  Should you have questions after your visit or need to cancel or reschedule your appointment, please contact [facility] [phone]  and follow the prompts.  Office hours are 8:00 a.m. to 4:30 p.m. Monday - Friday. Please note that voicemails left after 4:00 p.m. may not be returned until the following business day.  We are closed weekends and major holidays. You have access to a nurse at all times for urgent questions. Please call the main number to the clinic [phone] and follow the prompts.  For any non-urgent questions, you may also contact your provider using MyChart. We now offer e-Visits for anyone 59 and older to request care online for non-urgent symptoms. For details visit [email or web address].   Also download the MyChart app! Go to the app store, search "MyChart", open the app, select [place], and log in with your MyChart username and password.

## 2022-08-22 NOTE — Progress Notes (Signed)
[facility] [address], [place] [address]    Clinic Day:  08/22/2022  Referring physician: Mechele Claude, MD  Patient Care Team: Mechele Claude, MD as PCP - General (Family Medicine) Mechele Claude, MD (Family Medicine) Danella Maiers, [facility] as Pharmacist (Family Medicine) Marguerita Merles, Reuel Boom, MD as Consulting Physician (Gastroenterology) Franky Macho, MD as Consulting Physician (General Surgery) Pershing Proud, RN as Oncology Nurse Navigator [name], RN as Oncology Nurse Navigator [name], MD as Medical Oncologist (Medical Oncology) Therese Sarah, RN as Oncology Nurse Navigator (Medical Oncology) [name], MD as Consulting Physician (Hematology) Mechele Claude, MD as Referring Physician (Family Medicine)   ASSESSMENT & PLAN:   Assessment: 1.  Inflammatory right breast cancer: - Bilateral diagnostic mammogram (02/20/2022): Suspicious right axillary lymphadenopathy.  Indeterminate intramammary lymph node in the right breast at 9:00.  New right breast skin and trabecular thickening, related to vascular congestion from enlarged lymph nodes in the right axilla. - Right axillary lymph node core biopsy (02/20/2022): Morphology and IHC compatible with primary breast cancer with differential diagnosis of urothelial carcinoma and primary lung carcinoma (squamous).  Grade 3.  ER 0%, PR 0%, HER2 2+, Ki-67 60%, HER2 negative by FISH. - Right breast lymph node biopsy at 9:00 (02/20/2022): Negative for carcinoma. - MRI breast (03/02/2022): In the upper outer right breast, mid to posterior depth there is a patchy clumped non-mass enhancement in a linear orientation spanning approximately 3.8 cm.  There is diffuse thickening of the skin in the right breast with skin enhancement.  Left breast with no mass or abnormal enhancement.  Numerous bulky matted lymph nodes in the right axilla. - Right breast UOQ biopsy (03/19/2022): Benign  breast with fibrocystic changes including stromal fibrosis, adenosis, usual ductal hyperplasia.  Negative for carcinoma. - PET scan (03/22/2022): Bulky hypermetabolic right axillary and subpectoral lymphadenopathy.  No other definite new sites of metastatic disease.  6 mm right lower lobe lung nodule with no FDG uptake.  Plan:  1.  Triple negative right breast cancer: - Cycle 2 of before meals and pembrolizumab on 08/01/2022. - She had diarrhea mostly watery and some of them soft up to 5 to 8/day.  She has occasional nausea but denied any vomiting. - She has developed left axillary boil with pus in the last few days.  Denies any fevers or chills. - Labs today: White count 3.4 with normal ANC.  Platelets are normal.  Hemoglobin is 6.4.  Will transfuse 2 units PRBC. - Will make referral to general surgery for incision and drainage of the left axillary abscess. - Will start her on Keflex 500 mg 3 times daily. - She will also receive 1 L of fluids with electrolytes. - Will hold her treatment today and reevaluate her next week.  If the infection is cleared, will give her next treatment.  Will consider dose reduction.   2.  Hypomagnesemia: - Continue magnesium twice daily.  Magnesium is normal.  3.  Weight loss: - She has lost another 4 pounds since last treatment.  She eats good breakfast but not so much of other meals.  No orders of the defined types were placed in this encounter.   I,Alexis Herring,acting as a Neurosurgeon for [name], MD.,have documented all relevant documentation on the behalf of [name], MD,as directed by  [name], MD while in the presence of [name], MD.  I, [name] MD, have reviewed the above documentation for accuracy and completeness, and  I agree with the above.    [name], MD   4/17/20245:34 PM  CHIEF COMPLAINT:   Diagnosis: locally advanced TNBC    Cancer Staging  Malignant neoplasm of  overlapping sites of right female breast Staging form: Breast, AJCC 8th Edition - Clinical stage from 03/08/2022: Stage IIIC (cT4d, cN2, cM0, G3, ER-, PR-, HER2-) - Signed by Malachy Mood, MD on 03/08/2022    Prior Therapy: None  Current Therapy:  Pembrolizumab, carboplatin and paclitaxel followed by pembrolizumab, Adriamycin and Cytoxan   HISTORY OF PRESENT ILLNESS:   Oncology History Overview Note   Cancer Staging  Malignant neoplasm of overlapping sites of right female breast [facility]) Staging form: Breast, AJCC 8th Edition - Clinical stage from 03/08/2022: Stage IIIC (cT2, cN2, cM0, G3, ER-, PR-, HER2-) - Signed by Malachy Mood, MD on 03/08/2022    Malignant neoplasm of overlapping sites of right female breast  02/20/2022 Mammogram   CLINICAL DATA:  62 year old female recalled from screening mammography 03/01/2021 for right breast calcifications and subsequent benign discordant biopsy of these calcifications in the central posterior right breast December 2022 with excision recommended. The patient initially followed up with surgery in April however canceled her scheduled surgery and most recently followed up with Dr. Henreitta Leber September 2023 with diagnostic imaging, possible RF tag placement and subsequent excision recommended.   EXAM: DIGITAL DIAGNOSTIC BILATERAL MAMMOGRAM WITH TOMOSYNTHESIS; ULTRASOUND RIGHT BREAST LIMITED  MPRESSION: 1.  Suspicious right axillary lymphadenopathy.   2. Indeterminate intramammary lymph node in the right breast at 9 o'clock.   3. 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   4. Decreased calcifications noted at prior benign biopsy site in the lower central posterior right breast at site of X shaped biopsy marking clip.   02/20/2022 Initial Biopsy   FINAL MICROSCOPIC DIAGNOSIS:   A. AXILLA, RIGHT, LYMPH  NODE, NEEDLE CORE BIOPSY:  - Positive for carcinoma (see Comment)   B. LYMPH NODE, RIGHT BREAST, BIOPSY:  - Negative for carcinoma   COMMENT:  Part A: Morphology and immunohistochemical staining are most compatible with primary breast carcinoma with metaplastic changes, however differential diagnosis also includes urothelial carcinoma and less likely primary lung carcinoma (squamous).  No lymphoid tissue is identified.  Clinical and radiologic correlation is suggested.   ADDENDUM:  In case of a breast origin, the appropriate grade would be grade 3  (3+3+2)   ADDENDUM:  PROGNOSTIC INDICATOR RESULTS:  The tumor cells are EQUIVOCAL for Her2 (2+).  Estrogen Receptor:       0%, NEGATIVE  Progesterone Receptor:   0%, NEGATIVE  Proliferation Marker Ki-67:   60%   ADDENDUM:  FLOURESCENCE IN-SITU HYBRIDIZATION RESULTS:  GROUP 5:   HER2 **NEGATIVE**    03/02/2022 Imaging   EXAM: BILATERAL BREAST MRI WITH AND WITHOUT CONTRAST  IMPRESSION: 1. There is a suspicious 3.8 cm area of patchy non mass enhancement in a linear orientation in the slightly upper outer right breast in the mid to posterior depth spanning 3.8 cm.   2. Diffuse skin thickening with enhancement of the skin, concerning for inflammatory breast cancer.   3. Numerous bulky matted lymph nodes in the right axilla, one of which corresponds with the biopsy-proven metastatic lymph node.   4.  No evidence of left breast malignancy.   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)   03/08/2022  Cancer Staging   Staging form: Breast, AJCC 8th Edition - Clinical stage from 03/08/2022: Stage IIIC (cT4d, cN2, cM0, G3, ER-, PR-, HER2-) - Signed by Malachy Mood, MD on 03/08/2022 Histologic grading system: 3 grade system   03/19/2022 Pathology Results   Diagnosis Breast, right, needle core biopsy, upper outer quadrant, barbell clip BENIGN BREAST WITH FIBROCYSTIC CHANGES INCLUDING  STROMAL FIBROSIS, ADENOSIS AND USUAL DUCT HYPERPLASIA BENIGN FIBROMATOID CHANGE NEGATIVE FOR MICROCALCIFICATIONS NEGATIVE FOR CARCINOMA   03/22/2022 PET scan   IMPRESSION: Bulky hypermetabolic right axillary and subpectoral lymphadenopathy, consistent with metastatic disease.   No other definite sites of metastatic disease identified.   6 mm right lower lobe pulmonary nodule shows no FDG uptake, but is too small to definitively characterize by PET. Recommend continued follow-up by chest CT in 3-4 months.   Aortic Atherosclerosis (ICD10-I70.0).   04/02/2022 -  Chemotherapy   Patient is on Treatment Plan : BREAST Pembrolizumab (200) D1 + Carboplatin (5) D1 + Paclitaxel (80) D1,8,15 q21d X 4 cycles / Pembrolizumab (200) D1 + AC D1 q21d x 4 cycles      Genetic Testing   Negative genetic testing. No pathogenic variants identified on the [facility] Common Hereditary Cancers+RNA panel. The report date is 05/31/2022.  The Common Hereditary Cancers Panel + RNA offered by [facility] includes sequencing and/or deletion duplication testing of the following 48 genes: APC*, ATM*, AXIN2, BAP1, BARD1, BMPR1A, BRCA1, BRCA2, BRIP1, CDH1, CDK4, CDKN2A (p14ARF), CDKN2A (p16INK4a), CHEK2, CTNNA1, DICER1*, EPCAM*, FH*, GREM1*, HOXB13, KIT, MBD4, MEN1*, MLH1*, MSH2*, MSH3*, MSH6*, MUTYH, NF1*, NTHL1, PALB2, PDGFRA, PMS2*, POLD1*, POLE, PTEN*, RAD51C, RAD51D, SDHA*, SDHB, SDHC*, SDHD, SMAD4, SMARCA4, STK11, TP53, TSC1*, TSC2, VHL.      INTERVAL HISTORY:   Marilyn Ford is a 62 y.o. female presenting to clinic today for follow up of locally advanced TNBC. She was last seen by me on 08/01/22.  Today, she states that she has been very weak. Her energy level is at 0%. Her appetite level is at 75%- she has been eating a good breakfast but has nausea with trying to eat later in the day. Patient reports having x1 episode of vomiting a day which is relieved with Compazine and Zofran prn. She has lost 4lbs since 08/01/22.   She  continues to have dizziness with sitting and standing, this has worsened recently.   She states that she has been having severe diarrhea x2 weeks since last treatment. She often needs to wake up at night due to the diarrhea. She reports having 4 episodes of diarrhea since waking up this morning. Her stools fluctuate between soft, loose to watery, mostly watery stools. She notes that she passes small amounts of stool with each episode. She is taking 5-6 Imodium doses per day.  She states that she has had a boil in her left axilla x1 week which has been draining clear fluid. She reports pain with palpation of the area.  PAST MEDICAL HISTORY:   Past Medical History: Past Medical History:  Diagnosis Date   Allergy    Anxiety    Breast cancer (HCC)    Diabetes mellitus without complication (HCC)    GERD (gastroesophageal reflux disease)    Heart murmur    as a child   History of kidney stones    Hypertension    Pneumonia    Stroke [facility])    Vaginal Pap smear, abnormal     Surgical History: Past Surgical History:  Procedure Laterality Date   CESAREAN SECTION  CHOLECYSTECTOMY     COLONOSCOPY WITH PROPOFOL N/A 07/19/2020   Procedure: COLONOSCOPY WITH PROPOFOL;  Surgeon: Dolores Frame, MD;  Location: [facility];  Service: Gastroenterology;  Laterality: N/A;  AM   POLYPECTOMY  07/19/2020   Procedure: POLYPECTOMY;  Surgeon: Marguerita Merles, Reuel Boom, MD;  Location: [facility];  Service: Gastroenterology;;   PORTACATH PLACEMENT N/A 03/20/2022   Procedure: INSERTION PORT-A-CATH;  Surgeon: Abigail Miyamoto, MD;  Location: [facility] OR[facility];  Service: General;  Laterality: N/A;    Social History: Social History   Socioeconomic History   Marital status: Married    Spouse name: Fayrene Fearing   Number of children: 2   Years of education: 10   Highest education level: 10th grade  Occupational History   Occupation: disabled  Tobacco Use   Smoking status: Former    Packs/day: 0.50     Years: 25.00    Additional pack years: 0.00    Total pack years: 12.50    Types: Cigarettes    Start date: 12/08/1999    Quit date: 03/12/2015    Years since quitting: 7.4   Smokeless tobacco: Never  Vaping Use   Vaping Use: Never used  Substance and Sexual Activity   Alcohol use: Yes    Alcohol/week: 2.0 - 3.0 standard drinks of alcohol    Types: 2 - 3 Glasses of wine per week   Drug use: No   Sexual activity: Not Currently    Birth control/protection: Abstinence, Post-menopausal  Other Topics Concern   Not on file  Social History Narrative   Volunteers at [employer] for a few hours every day.    She really enjoys getting out of of the house and working there.    Social Determinants of Health   Financial Resource Strain: Low Risk  (03/01/2022)   Overall Financial Resource Strain (CARDIA)    Difficulty of Paying Living Expenses: Not very hard  Food Insecurity: No Food Insecurity (04/09/2022)   Hunger Vital Sign    Worried About Running Out of Food in the Last Year: Never true    Ran Out of Food in the Last Year: Never true  Transportation Needs: No Transportation Needs (03/01/2022)   PRAPARE - Administrator, Civil Service (Medical): No    Lack of Transportation (Non-Medical): No  Physical Activity: Insufficiently Active (03/01/2022)   Exercise Vital Sign    Days of Exercise per Week: 1 day    Minutes of Exercise per Session: 10 min  Stress: Stress Concern Present (03/01/2022)   [employer] of Occupational Health - Occupational Stress Questionnaire    Feeling of Stress : Rather much  Social Connections: Moderately Isolated (03/01/2022)   Social Connection and Isolation Panel [NHANES]    Frequency of Communication with Friends and Family: Twice a week    Frequency of Social Gatherings with Friends and Family: Twice a week    Attends Religious Services: Never    Database administrator or Organizations: No    Attends Banker Meetings:  Never    Marital Status: Married  Catering manager Violence: Not At Risk (04/09/2022)   Humiliation, Afraid, Rape, and Kick questionnaire    Fear of Current or Ex-Partner: No    Emotionally Abused: No    Physically Abused: No    Sexually Abused: No    Family History: Family History  Problem Relation Age of Onset   Diabetes Mother    Uterine cancer Mother 30 - 15   Diabetes  Brother    Breast cancer Maternal Aunt        dx >50, d. from cancer   Cancer Maternal Grandmother        unk type, "back cancer?"    Current Medications:  Current Outpatient Medications:    Alcohol Swabs (B-D SINGLE USE SWABS REGULAR) PADS, Test BS daily and as needed Dx E11.9, Disp: 100 each, Rfl: 3   aluminum-magnesium hydroxide-simethicone (MAALOX) 200-200-20 MG/5ML SUSP, Take 30 mLs by mouth 4 (four) times daily -  before meals and at bedtime., Disp: 480 mL, Rfl: 2   amLODipine (NORVASC) 10 MG tablet, 1 tablet daily, Disp: 90 tablet, Rfl: 3   Apple Cider Vinegar 500 MG TABS, Take 500 mg by mouth in the morning., Disp: , Rfl:    aspirin 81 MG EC tablet, Take 1 tablet (81 mg total) by mouth daily., Disp: 30 tablet, Rfl: 0   atorvastatin (LIPITOR) 80 MG tablet, TAKE 1 TABLET EVERY DAY AT 6PM, Disp: 90 tablet, Rfl: 3   Blood Glucose Calibration (TRUE METRIX LEVEL 1) Low SOLN, Use with glucometer Dx E11.9, Disp: 3 each, Rfl: 0   Blood Glucose Monitoring Suppl (TRUE METRIX AIR GLUCOSE METER) w/Device KIT, Test BS daily and as needed Dx E11.9, Disp: 1 kit, Rfl: 0   Calcium Carb-Cholecalciferol (CALCIUM-VITAMIN D) 600-400 MG-UNIT TABS, Take 1 tablet by mouth in the morning., Disp: , Rfl:    cephALEXin (KEFLEX) 500 MG capsule, Take 1 capsule (500 mg total) by mouth 3 (three) times daily., Disp: 21 capsule, Rfl: 0   Coenzyme Q10 (COQ10) 100 MG CAPS, Take 100 mg by mouth in the morning., Disp: , Rfl:    Cranberry 425 MG CAPS, Take 425 mg by mouth in the morning and at bedtime., Disp: , Rfl:    Dulaglutide (TRULICITY)  4.5 MG/0.5ML SOPN, Inject 4.5 mg as directed once a week., Disp: 6 mL, Rfl: 1   Flaxseed, Linseed, (FLAXSEED OIL) 1000 MG CAPS, Take 1,000 mg by mouth in the morning., Disp: , Rfl:    gabapentin (NEURONTIN) 300 MG capsule, Take 1 capsule (300 mg total) by mouth 3 (three) times daily., Disp: 90 capsule, Rfl: 0   glucose blood (TRUE METRIX BLOOD GLUCOSE TEST) test strip, Test BS daily and as needed Dx E11.9, Disp: 100 each, Rfl: 3   icosapent Ethyl (VASCEPA) 1 g capsule, Take 2 g by mouth 2 (two) times daily., Disp: , Rfl:    Krill Oil 500 MG CAPS, Take 3 capsules (1,500 mg total) by mouth in the morning and at bedtime., Disp: , Rfl:    lidocaine (XYLOCAINE) 2 % solution, Use as directed 15 mLs in the mouth or throat every 6 (six) hours as needed for mouth pain., Disp: 250 mL, Rfl: 1   LORazepam (ATIVAN) 1 MG tablet, Take 1 mg by mouth every 8 (eight) hours as needed., Disp: , Rfl:    magnesium oxide (MAG-OX) 400 (240 Mg) MG tablet, TAKE 1 TABLET BY MOUTH TWICE A DAY, Disp: 60 tablet, Rfl: 1   meloxicam (MOBIC) 15 MG tablet, Take 15 mg by mouth daily., Disp: , Rfl:    metoprolol (TOPROL-XL) 200 MG 24 hr tablet, TAKE 1 TABLET ONE TIME DAILY, WITH OR IMMEDIATELY FOLLOWING A MEAL, Disp: 90 tablet, Rfl: 3   Multiple Vitamin (MULTIVITAMIN) capsule, Take 1 capsule by mouth in the morning., Disp: , Rfl:    OLANZapine (ZYPREXA) 5 MG tablet, Take 1 tablet (5 mg total) by mouth at bedtime., Disp: 30 tablet, Rfl:  2   pantoprazole (PROTONIX) 40 MG tablet, TAKE 1 TABLET twice daily FOR STOMACH, Disp: 180 tablet, Rfl: 3   traMADol (ULTRAM) 50 MG tablet, Take 1 tablet (50 mg total) by mouth every 6 (six) hours as needed for moderate pain or severe pain., Disp: 20 tablet, Rfl: 0   traZODone (DESYREL) 150 MG tablet, TAKE 1 OR 2 TABLETS AT BEDTIME FOR SLEEP, Disp: 180 tablet, Rfl: 3   TRUEplus Lancets 33G MISC, Test BS daily and as needed Dx E11.9, Disp: 100 each, Rfl: 3   valsartan (DIOVAN) 320 MG tablet, Take 1  tablet (320 mg total) by mouth daily. For blood pressure., Disp: 90 tablet, Rfl: 1   zinc gluconate 50 MG tablet, Take 50 mg by mouth daily., Disp: , Rfl:    lidocaine-prilocaine (EMLA) cream, Apply to affected area once (Patient not taking: Reported on 08/22/2022), Disp: 30 g, Rfl: 3   ondansetron (ZOFRAN) 8 MG tablet, Take 1 tablet (8 mg total) by mouth every 8 (eight) hours as needed for nausea or vomiting. (Patient not taking: Reported on 08/22/2022), Disp: 30 tablet, Rfl: 3   prochlorperazine (COMPAZINE) 10 MG tablet, Take 1 tablet (10 mg total) by mouth every 6 (six) hours as needed for nausea or vomiting. (Patient not taking: Reported on 08/22/2022), Disp: 30 tablet, Rfl: 1   Allergies: Allergies  Allergen Reactions   Penicillins Shortness Of Breath   Sulfa Antibiotics Rash    REVIEW OF SYSTEMS:   Review of Systems  Constitutional:  Positive for appetite change, fatigue and unexpected weight change. Negative for chills and fever.  HENT:   Positive for trouble swallowing. Negative for lump/mass, mouth sores, nosebleeds and sore throat.   Eyes:  Negative for eye problems.  Respiratory:  Negative for cough and shortness of breath.   Cardiovascular:  Negative for chest pain, leg swelling and palpitations.  Gastrointestinal:  Positive for diarrhea, nausea and vomiting. Negative for abdominal pain and constipation.  Genitourinary:  Negative for bladder incontinence, difficulty urinating, dysuria, frequency, hematuria and nocturia.   Musculoskeletal:  Negative for arthralgias, back pain, flank pain, myalgias and neck pain.  Skin:  Negative for itching and rash.  Neurological:  Positive for dizziness. Negative for headaches and numbness.  Hematological:  Does not bruise/bleed easily.  Psychiatric/Behavioral:  Negative for depression, sleep disturbance and suicidal ideas. The patient is not nervous/anxious.   All other systems reviewed and are negative.    VITALS:   There were no vitals  taken for this visit.  Wt Readings from Last 3 Encounters:  08/22/22 128 lb 12.8 oz (58.4 kg)  08/01/22 132 lb 6.4 oz (60.1 kg)  07/23/22 135 lb 6.4 oz (61.4 kg)    There is no height or weight on file to calculate BMI.  Performance status (ECOG): 1 - Symptomatic but completely ambulatory  PHYSICAL EXAM:   Physical Exam Vitals and nursing note reviewed. Exam conducted with a chaperone present.  Constitutional:      Appearance: Normal appearance.  Cardiovascular:     Rate and Rhythm: Normal rate and regular rhythm.     Pulses: Normal pulses.     Heart sounds: Normal heart sounds.  Pulmonary:     Effort: Pulmonary effort is normal.     Breath sounds: Normal breath sounds.  Abdominal:     Palpations: Abdomen is soft. There is no hepatomegaly, splenomegaly or mass.     Tenderness: There is no abdominal tenderness.  Musculoskeletal:     Right lower leg: No  edema.     Left lower leg: No edema.  Lymphadenopathy:     Cervical: No cervical adenopathy.     Right cervical: No superficial, deep or posterior cervical adenopathy.    Left cervical: No superficial, deep or posterior cervical adenopathy.     Upper Body:     Right upper body: No supraclavicular or axillary adenopathy.     Left upper body: No supraclavicular or axillary adenopathy.  Skin:    Comments: Left axillary boil with pus draining  Neurological:     General: No focal deficit present.     Mental Status: She is alert and oriented to person, place, and time.  Psychiatric:        Mood and Affect: Mood normal.        Behavior: Behavior normal.     LABS:      Latest Ref Rng & Units 08/22/2022    8:26 AM 08/01/2022    8:07 AM 07/23/2022    9:45 AM  CBC  WBC 4.0 - 10.5 K/uL 3.4  8.5  4.2   Hemoglobin 12.0 - 15.0 g/dL 6.4  8.3  8.3   Hematocrit 36.0 - 46.0 % 20.4  26.0  25.8   Platelets 150 - 400 K/uL 164  308  119       Latest Ref Rng & Units 08/22/2022    8:26 AM 08/01/2022    8:07 AM 07/23/2022    9:45 AM   CMP  Glucose 70 - 99 mg/dL 409  811  914   BUN 8 - 23 mg/dL 14  15  8    Creatinine 0.44 - 1.00 mg/dL 7.82  9.56  2.13   Sodium 135 - 145 mmol/L 136  137  137   Potassium 3.5 - 5.1 mmol/L 4.2  4.1  4.0   Chloride 98 - 111 mmol/L 100  101  99   CO2 22 - 32 mmol/L 26  27  28    Calcium 8.9 - 10.3 mg/dL 9.2  8.5  9.0   Total Protein 6.5 - 8.1 g/dL 6.3  5.9  6.8   Total Bilirubin 0.3 - 1.2 mg/dL 0.8  0.6  0.7   Alkaline Phos 38 - 126 U/L 116  119  137   AST 15 - 41 U/L 22  24  22    ALT 0 - 44 U/L 19  25  20       No results found for: "CEA1", "CEA" / No results found for: "CEA1", "CEA" No results found for: "PSA1" No results found for: "YQM578" No results found for: "CAN125"  No results found for: "TOTALPROTELP", "ALBUMINELP", "A1GS", "A2GS", "BETS", "BETA2SER", "GAMS", "MSPIKE", "SPEI" No results found for: "TIBC", "FERRITIN", "IRONPCTSAT" No results found for: "LDH"   STUDIES:   No results found.

## 2022-08-22 NOTE — Patient Instructions (Addendum)
[facility] at [facility] Discharge Instructions   You were seen and examined today by Dr. Ellin Saba.  He reviewed the results of your lab work. Your hemoglobin is low at 6.4. We will plan to give you 2 units of blood.   We will hold your treatment today.   We will refer you to a local surgeon for drainage of the abscess under left arm.   Return as scheduled next week.    Thank you for choosing [facility] at [facility] to provide your oncology and hematology care.  To afford each patient quality time with our provider, please arrive at least 15 minutes before your scheduled appointment time.   If you have a lab appointment with the Cancer Center please come in thru the Main Entrance and check in at the main information desk.  You need to re-schedule your appointment should you arrive 10 or more minutes late.  We strive to give you quality time with our providers, and arriving late affects you and other patients whose appointments are after yours.  Also, if you no show three or more times for appointments you may be dismissed from the clinic at the providers discretion.     Again, thank you for choosing [facility].  Our hope is that these requests will decrease the amount of time that you wait before being seen by our physicians.       _____________________________________________________________  Should you have questions after your visit to [facility], please contact our office at [phone] and follow the prompts.  Our office hours are 8:00 a.m. and 4:30 p.m. Monday - Friday.  Please note that voicemails left after 4:00 p.m. may not be returned until the following business day.  We are closed weekends and major holidays.  You do have access to a nurse 24-7, just call the main number to the clinic [phone] and do not press any options, hold on the line and a nurse will answer the phone.    For prescription  refill requests, have your pharmacy contact our office and allow 72 hours.    Due to Covid, you will need to wear a mask upon entering the hospital. If you do not have a mask, a mask will be given to you at the Main Entrance upon arrival. For doctor visits, patients may have 1 support person age 9 or older with them. For treatment visits, patients can not have anyone with them due to social distancing guidelines and our immunocompromised population.

## 2022-08-22 NOTE — Progress Notes (Signed)
No treatment today per Dr. Ellin Saba.  Patient to receive 2 UPRBC for HGB of 6.4 and one liter of house fluids.  Patient complaining of nausea and asking for something while she is here.  Per Chapman Moss RN/Dr. Ellin Saba patient can have 1 mg PO Ativan X 1.  Peripheral IV started and blood return noted pre and post infusion.  Stable during transfusion and infusion without adverse affects.  Vital signs stable.  No complaints at this time.  Discharge from clinic ambulatory in stable condition.  Alert and oriented X 3.  Follow up with [facility] as scheduled.

## 2022-08-23 LAB — BPAM RBC
Blood Product Expiration Date: 202405082359
Blood Product Expiration Date: 202405082359
ISSUE DATE / TIME: 202404171026
Unit Type and Rh: 600
Unit Type and Rh: 600

## 2022-08-23 LAB — TYPE AND SCREEN: Unit division: 0

## 2022-08-24 ENCOUNTER — Ambulatory Visit: Payer: No Typology Code available for payment source

## 2022-08-26 NOTE — Progress Notes (Signed)
Marilyn Ford [address] [place] [address]    Clinic Day:  08/27/2022  Referring physician: Mechele Claude, MD  Patient Care Team: Marilyn Claude, MD as PCP - General (Family Medicine) Marilyn Claude, MD (Family Medicine) Marilyn Ford, [facility] as Pharmacist (Family Medicine) Marilyn Ford, Marilyn Boom, MD as Consulting Physician (Gastroenterology) Marilyn Macho, MD as Consulting Physician (General Surgery) Marilyn Proud, RN as Oncology Nurse Navigator Marilyn Angelica, RN as Oncology Nurse Navigator Marilyn Massed, MD as Medical Oncologist (Medical Oncology) Therese Sarah, RN as Oncology Nurse Navigator (Medical Oncology) Marilyn Massed, MD as Consulting Physician (Hematology) Marilyn Claude, MD as Referring Physician (Family Medicine)   ASSESSMENT & PLAN:   Assessment: 1.  Inflammatory right breast cancer: - Bilateral diagnostic mammogram (02/20/2022): Suspicious right axillary lymphadenopathy.  Indeterminate intramammary lymph node in the right breast at 9:00.  New right breast skin and trabecular thickening, related to vascular congestion from enlarged lymph nodes in the right axilla. - Right axillary lymph node core biopsy (02/20/2022): Morphology and IHC compatible with primary breast cancer with differential diagnosis of urothelial carcinoma and primary lung carcinoma (squamous).  Grade 3.  ER 0%, PR 0%, HER2 2+, Ki-67 60%, HER2 negative by FISH. - Right breast lymph node biopsy at 9:00 (02/20/2022): Negative for carcinoma. - MRI breast (03/02/2022): In the upper outer right breast, mid to posterior depth there is a patchy clumped non-mass enhancement in a linear orientation spanning approximately 3.8 cm.  There is diffuse thickening of the skin in the right breast with skin enhancement.  Left breast with no mass or abnormal enhancement.  Numerous bulky matted lymph nodes in the right axilla. - Right breast UOQ biopsy (03/19/2022): Benign  breast with fibrocystic changes including stromal fibrosis, adenosis, usual ductal hyperplasia.  Negative for carcinoma. - PET scan (03/22/2022): Bulky hypermetabolic right axillary and subpectoral lymphadenopathy.  No other definite new sites of metastatic disease.  6 mm right lower lobe lung nodule with no FDG uptake.   Plan: 1.  Triple negative right breast cancer: - Cycle 2 of chemotherapy and pembrolizumab on 08/01/2022. - Due to her left axillary abscess and diarrhea, we held her chemotherapy last week.  Diarrhea has improved. - Labs today: White count 3.7 with normal ANC.  Platelet count is 148.  LFTs and creatinine are normal. - She is still has left axillary abscess.  Will hold her treatment today and reevaluate next week.   2.  Hypomagnesemia: - Continue magnesium twice daily.  Magnesium is normal today.   3.  Weight loss: - She ate very well in the last 1 week and gained weight.  4.  Left axillary abscess: - She started taking Keflex 500 mg 3 times daily and Augmentin twice daily last week.  I have told her to stop Keflex. - She does not report any pain.  However the area is draining pus daily. - She has an appointment to see Dr. Lovell Sheehan tomorrow for incision and drainage. - We will hold her treatments until this is resolved.  No orders of the defined types were placed in this encounter.     I,Marilyn Ford,acting as a Neurosurgeon for Marilyn Massed, MD.,have documented all relevant documentation on the behalf of Marilyn Massed, MD,as directed by  Marilyn Massed, MD while in the presence of Marilyn Massed, MD.   I, Marilyn Massed MD, have reviewed the above documentation for accuracy and completeness, and I agree with the above.   Marilyn Massed, MD  4/22/20245:32 PM  CHIEF COMPLAINT:   Diagnosis: locally advanced TNBC    Cancer Staging  Malignant neoplasm of overlapping sites of right female breast Staging form: Breast, AJCC 8th  Edition - Clinical stage from 03/08/2022: Stage IIIC (cT4d, cN2, cM0, G3, ER-, PR-, HER2-) - Signed by Malachy Mood, MD on 03/08/2022    Prior Therapy: none  Current Therapy:  Pembrolizumab, carboplatin and paclitaxel followed by pembrolizumab, Adriamycin and Cytoxan    HISTORY OF PRESENT ILLNESS:   Oncology History Overview Note   Cancer Staging  Malignant neoplasm of overlapping sites of right female breast [facility]) Staging form: Breast, AJCC 8th Edition - Clinical stage from 03/08/2022: Stage IIIC (cT2, cN2, cM0, G3, ER-, PR-, HER2-) - Signed by Malachy Mood, MD on 03/08/2022    Malignant neoplasm of overlapping sites of right female breast  02/20/2022 Mammogram   CLINICAL DATA:  62 year old female recalled from screening mammography 03/01/2021 for right breast calcifications and subsequent benign discordant biopsy of these calcifications in the central posterior right breast December 2022 with excision recommended. The patient initially followed up with surgery in April however canceled her scheduled surgery and most recently followed up with Dr. Henreitta Leber September 2023 with diagnostic imaging, possible RF tag placement and subsequent excision recommended.   EXAM: DIGITAL DIAGNOSTIC BILATERAL MAMMOGRAM WITH TOMOSYNTHESIS; ULTRASOUND RIGHT BREAST LIMITED  MPRESSION: 1.  Suspicious right axillary lymphadenopathy.   2. Indeterminate intramammary lymph node in the right breast at 9 o'clock.   3. 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   4. Decreased calcifications noted at prior benign biopsy site in the lower central posterior right breast at site of X shaped biopsy marking clip.   02/20/2022 Initial Biopsy   FINAL MICROSCOPIC DIAGNOSIS:   A. AXILLA, RIGHT, LYMPH NODE, NEEDLE CORE BIOPSY:  - Positive for carcinoma (see Comment)    B. LYMPH NODE, RIGHT BREAST, BIOPSY:  - Negative for carcinoma   COMMENT:  Part A: Morphology and immunohistochemical staining are most compatible with primary breast carcinoma with metaplastic changes, however differential diagnosis also includes urothelial carcinoma and less likely primary lung carcinoma (squamous).  No lymphoid tissue is identified.  Clinical and radiologic correlation is suggested.   ADDENDUM:  In case of a breast origin, the appropriate grade would be grade 3  (3+3+2)   ADDENDUM:  PROGNOSTIC INDICATOR RESULTS:  The tumor cells are EQUIVOCAL for Her2 (2+).  Estrogen Receptor:       0%, NEGATIVE  Progesterone Receptor:   0%, NEGATIVE  Proliferation Marker Ki-67:   60%   ADDENDUM:  FLOURESCENCE IN-SITU HYBRIDIZATION RESULTS:  GROUP 5:   HER2 **NEGATIVE**    03/02/2022 Imaging   EXAM: BILATERAL BREAST MRI WITH AND WITHOUT CONTRAST  IMPRESSION: 1. There is a suspicious 3.8 cm area of patchy non mass enhancement in a linear orientation in the slightly upper outer right breast in the mid to posterior depth spanning 3.8 cm.   2. Diffuse skin thickening with enhancement of the skin, concerning for inflammatory breast cancer.   3. Numerous bulky matted lymph nodes in the right axilla, one of which corresponds with the biopsy-proven metastatic lymph node.   4.  No evidence of left breast malignancy.   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)   03/08/2022 Cancer Staging   Staging form: Breast, AJCC 8th Edition - Clinical  stage from 03/08/2022: Stage IIIC (cT4d, cN2, cM0, G3, ER-, PR-, HER2-) - Signed by Malachy Mood, MD on 03/08/2022 Histologic grading system: 3 grade system   03/19/2022 Pathology Results   Diagnosis Breast, right, needle core biopsy, upper outer quadrant, barbell clip BENIGN BREAST WITH FIBROCYSTIC CHANGES INCLUDING STROMAL FIBROSIS, ADENOSIS AND USUAL DUCT HYPERPLASIA BENIGN  FIBROMATOID CHANGE NEGATIVE FOR MICROCALCIFICATIONS NEGATIVE FOR CARCINOMA   03/22/2022 PET scan   IMPRESSION: Bulky hypermetabolic right axillary and subpectoral lymphadenopathy, consistent with metastatic disease.   No other definite sites of metastatic disease identified.   6 mm right lower lobe pulmonary nodule shows no FDG uptake, but is too small to definitively characterize by PET. Recommend continued follow-up by chest CT in 3-4 months.   Aortic Atherosclerosis (ICD10-I70.0).   04/02/2022 -  Chemotherapy   Patient is on Treatment Plan : BREAST Pembrolizumab (200) D1 + Carboplatin (5) D1 + Paclitaxel (80) D1,8,15 q21d X 4 cycles / Pembrolizumab (200) D1 + AC D1 q21d x 4 cycles      Genetic Testing   Negative genetic testing. No pathogenic variants identified on the [facility] Common Hereditary Cancers+RNA panel. The report date is 05/31/2022.  The Common Hereditary Cancers Panel + RNA offered by [facility] includes sequencing and/or deletion duplication testing of the following 48 genes: APC*, ATM*, AXIN2, BAP1, BARD1, BMPR1A, BRCA1, BRCA2, BRIP1, CDH1, CDK4, CDKN2A (p14ARF), CDKN2A (p16INK4a), CHEK2, CTNNA1, DICER1*, EPCAM*, FH*, GREM1*, HOXB13, KIT, MBD4, MEN1*, MLH1*, MSH2*, MSH3*, MSH6*, MUTYH, NF1*, NTHL1, PALB2, PDGFRA, PMS2*, POLD1*, POLE, PTEN*, RAD51C, RAD51D, SDHA*, SDHB, SDHC*, SDHD, SMAD4, SMARCA4, STK11, TP53, TSC1*, TSC2, VHL.       INTERVAL HISTORY:   Marilyn Ford is a 62 y.o. female presenting to clinic today for follow up of locally advanced TNBC. She was last seen by me on 08/22/22.  Today, she states that she is doing well overall. Her appetite level is at 100%. Her energy level is at 75%.  PAST MEDICAL HISTORY:   Past Medical History: Past Medical History:  Diagnosis Date   Allergy    Anxiety    Breast cancer (HCC)    Diabetes mellitus without complication (HCC)    GERD (gastroesophageal reflux disease)    Heart murmur    as a child   History of kidney stones     Hypertension    Pneumonia    Stroke [facility])    Vaginal Pap smear, abnormal     Surgical History: Past Surgical History:  Procedure Laterality Date   CESAREAN SECTION     CHOLECYSTECTOMY     COLONOSCOPY WITH PROPOFOL N/A 07/19/2020   Procedure: COLONOSCOPY WITH PROPOFOL;  Surgeon: Dolores Frame, MD;  Location: [facility];  Service: Gastroenterology;  Laterality: N/A;  AM   POLYPECTOMY  07/19/2020   Procedure: POLYPECTOMY;  Surgeon: Dolores Frame, MD;  Location: [facility];  Service: Gastroenterology;;   PORTACATH PLACEMENT N/A 03/20/2022   Procedure: INSERTION PORT-A-CATH;  Surgeon: Abigail Miyamoto, MD;  Location: [facility];  Service: General;  Laterality: N/A;    Social History: Social History   Socioeconomic History   Marital status: Married    Spouse name: Marilyn Ford   Number of children: 2   Years of education: 10   Highest education level: 10th grade  Occupational History   Occupation: disabled  Tobacco Use   Smoking status: Former    Packs/day: 0.50    Years: 25.00    Additional pack years: 0.00    Total pack years: 12.50  Types: Cigarettes    Start date: 12/08/1999    Quit date: 03/12/2015    Years since quitting: 7.4   Smokeless tobacco: Never  Vaping Use   Vaping Use: Never used  Substance and Sexual Activity   Alcohol use: Yes    Alcohol/week: 2.0 - 3.0 standard drinks of alcohol    Types: 2 - 3 Glasses of wine per week   Drug use: No   Sexual activity: Not Currently    Birth control/protection: Abstinence, Post-menopausal  Other Topics Concern   Not on file  Social History Narrative   Volunteers at [employer] for a few hours every day.    She really enjoys getting out of of the house and working there.    Social Determinants of Health   Financial Resource Strain: Low Risk  (03/01/2022)   Overall Financial Resource Strain (CARDIA)    Difficulty of Paying Living Expenses: Not very hard  Food Insecurity: No Food  Insecurity (04/09/2022)   Hunger Vital Sign    Worried About Running Out of Food in the Last Year: Never true    Ran Out of Food in the Last Year: Never true  Transportation Needs: No Transportation Needs (03/01/2022)   PRAPARE - Administrator, Civil Service (Medical): No    Lack of Transportation (Non-Medical): No  Physical Activity: Insufficiently Active (03/01/2022)   Exercise Vital Sign    Days of Exercise per Week: 1 day    Minutes of Exercise per Session: 10 min  Stress: Stress Concern Present (03/01/2022)   Marilyn Ford of Occupational Health - Occupational Stress Questionnaire    Feeling of Stress : Rather much  Social Connections: Moderately Isolated (03/01/2022)   Social Connection and Isolation Panel [NHANES]    Frequency of Communication with Friends and Family: Twice a week    Frequency of Social Gatherings with Friends and Family: Twice a week    Attends Religious Services: Never    Database administrator or Organizations: No    Attends Banker Meetings: Never    Marital Status: Married  Catering manager Violence: Not At Risk (04/09/2022)   Humiliation, Afraid, Rape, and Kick questionnaire    Fear of Current or Ex-Partner: No    Emotionally Abused: No    Physically Abused: No    Sexually Abused: No    Family History: Family History  Problem Relation Age of Onset   Diabetes Mother    Uterine cancer Mother 38 - 61   Diabetes Brother    Breast cancer Maternal Aunt        dx >50, d. from cancer   Cancer Maternal Grandmother        unk type, "back cancer?"    Current Medications:  Current Outpatient Medications:    Alcohol Swabs (B-D SINGLE USE SWABS REGULAR) PADS, Test BS daily and as needed Dx E11.9, Disp: 100 each, Rfl: 3   aluminum-magnesium hydroxide-simethicone (MAALOX) 200-200-20 MG/5ML SUSP, Take 30 mLs by mouth 4 (four) times daily -  before meals and at bedtime., Disp: 480 mL, Rfl: 2   amLODipine (NORVASC) 10 MG tablet, 1  tablet daily, Disp: 90 tablet, Rfl: 3   Apple Cider Vinegar 500 MG TABS, Take 500 mg by mouth in the morning., Disp: , Rfl:    aspirin 81 MG EC tablet, Take 1 tablet (81 mg total) by mouth daily., Disp: 30 tablet, Rfl: 0   atorvastatin (LIPITOR) 80 MG tablet, TAKE 1 TABLET EVERY DAY AT  6PM, Disp: 90 tablet, Rfl: 3   Blood Glucose Calibration (TRUE METRIX LEVEL 1) Low SOLN, Use with glucometer Dx E11.9, Disp: 3 each, Rfl: 0   Blood Glucose Monitoring Suppl (TRUE METRIX AIR GLUCOSE METER) w/Device KIT, Test BS daily and as needed Dx E11.9, Disp: 1 kit, Rfl: 0   Calcium Carb-Cholecalciferol (CALCIUM-VITAMIN D) 600-400 MG-UNIT TABS, Take 1 tablet by mouth in the morning., Disp: , Rfl:    cephALEXin (KEFLEX) 500 MG capsule, Take 1 capsule (500 mg total) by mouth 3 (three) times daily., Disp: 21 capsule, Rfl: 0   Coenzyme Q10 (COQ10) 100 MG CAPS, Take 100 mg by mouth in the morning., Disp: , Rfl:    Cranberry 425 MG CAPS, Take 425 mg by mouth in the morning and at bedtime., Disp: , Rfl:    Dulaglutide (TRULICITY) 4.5 MG/0.5ML SOPN, Inject 4.5 mg as directed once a week., Disp: 6 mL, Rfl: 1   Flaxseed, Linseed, (FLAXSEED OIL) 1000 MG CAPS, Take 1,000 mg by mouth in the morning., Disp: , Rfl:    gabapentin (NEURONTIN) 300 MG capsule, Take 1 capsule (300 mg total) by mouth 3 (three) times daily., Disp: 90 capsule, Rfl: 0   glucose blood (TRUE METRIX BLOOD GLUCOSE TEST) test strip, Test BS daily and as needed Dx E11.9, Disp: 100 each, Rfl: 3   icosapent Ethyl (VASCEPA) 1 g capsule, Take 2 g by mouth 2 (two) times daily., Disp: , Rfl:    Krill Oil 500 MG CAPS, Take 3 capsules (1,500 mg total) by mouth in the morning and at bedtime., Disp: , Rfl:    lidocaine (XYLOCAINE) 2 % solution, Use as directed 15 mLs in the mouth or throat every 6 (six) hours as needed for mouth pain., Disp: 250 mL, Rfl: 1   lidocaine-prilocaine (EMLA) cream, Apply to affected area once, Disp: 30 g, Rfl: 3   magnesium oxide (MAG-OX) 400  (240 Mg) MG tablet, TAKE 1 TABLET BY MOUTH TWICE A DAY, Disp: 60 tablet, Rfl: 1   metoprolol (TOPROL-XL) 200 MG 24 hr tablet, TAKE 1 TABLET ONE TIME DAILY, WITH OR IMMEDIATELY FOLLOWING A MEAL, Disp: 90 tablet, Rfl: 3   Multiple Vitamin (MULTIVITAMIN) capsule, Take 1 capsule by mouth in the morning., Disp: , Rfl:    OLANZapine (ZYPREXA) 5 MG tablet, Take 1 tablet (5 mg total) by mouth at bedtime., Disp: 30 tablet, Rfl: 2   ondansetron (ZOFRAN) 8 MG tablet, Take 1 tablet (8 mg total) by mouth every 8 (eight) hours as needed for nausea or vomiting., Disp: 30 tablet, Rfl: 3   pantoprazole (PROTONIX) 40 MG tablet, TAKE 1 TABLET twice daily FOR STOMACH, Disp: 180 tablet, Rfl: 3   prochlorperazine (COMPAZINE) 10 MG tablet, Take 1 tablet (10 mg total) by mouth every 6 (six) hours as needed for nausea or vomiting., Disp: 30 tablet, Rfl: 1   traMADol (ULTRAM) 50 MG tablet, Take 1 tablet (50 mg total) by mouth every 6 (six) hours as needed for moderate pain or severe pain., Disp: 20 tablet, Rfl: 0   traZODone (DESYREL) 150 MG tablet, TAKE 1 OR 2 TABLETS AT BEDTIME FOR SLEEP, Disp: 180 tablet, Rfl: 3   TRUEplus Lancets 33G MISC, Test BS daily and as needed Dx E11.9, Disp: 100 each, Rfl: 3   valsartan (DIOVAN) 320 MG tablet, Take 1 tablet (320 mg total) by mouth daily. For blood pressure., Disp: 90 tablet, Rfl: 1   zinc gluconate 50 MG tablet, Take 50 mg by mouth daily., Disp: ,  Rfl:    LORazepam (ATIVAN) 1 MG tablet, Take 1 tablet (1 mg total) by mouth 2 (two) times daily as needed for anxiety., Disp: 10 tablet, Rfl: 0   meloxicam (MOBIC) 15 MG tablet, TAKE 1 TABLET (15 MG TOTAL) BY MOUTH DAILY. FOR JOINT AND MUSCLE PAIN, Disp: 90 tablet, Rfl: 1   Allergies: Allergies  Allergen Reactions   Penicillins Shortness Of Breath   Sulfa Antibiotics Rash    REVIEW OF SYSTEMS:   Review of Systems  Constitutional:  Negative for chills, fatigue and fever.  HENT:   Negative for lump/mass, mouth sores, nosebleeds,  sore throat and trouble swallowing.   Eyes:  Negative for eye problems.  Respiratory:  Negative for cough and shortness of breath.   Cardiovascular:  Negative for chest pain, leg swelling and palpitations.  Gastrointestinal:  Positive for nausea. Negative for abdominal pain, constipation, diarrhea and vomiting.  Genitourinary:  Negative for bladder incontinence, difficulty urinating, dysuria, frequency, hematuria and nocturia.   Musculoskeletal:  Negative for arthralgias, back pain, flank pain, myalgias and neck pain.  Skin:  Negative for itching and rash.  Neurological:  Positive for numbness. Negative for dizziness and headaches.  Hematological:  Does not bruise/bleed easily.  Psychiatric/Behavioral:  Negative for depression, sleep disturbance and suicidal ideas. The patient is nervous/anxious.   All other systems reviewed and are negative.    VITALS:   There were no vitals taken for this visit.  Wt Readings from Last 3 Encounters:  08/27/22 132 lb 7.9 oz (60.1 kg)  08/22/22 128 lb 12.8 oz (58.4 kg)  08/01/22 132 lb 6.4 oz (60.1 kg)    There is no height or weight on file to calculate BMI.  Performance status (ECOG): 1 - Symptomatic but completely ambulatory  PHYSICAL EXAM:   Physical Exam Vitals and nursing note reviewed. Exam conducted with a chaperone present.  Constitutional:      Appearance: Normal appearance.  Cardiovascular:     Rate and Rhythm: Normal rate and regular rhythm.     Pulses: Normal pulses.     Heart sounds: Normal heart sounds.  Pulmonary:     Effort: Pulmonary effort is normal.     Breath sounds: Normal breath sounds.  Abdominal:     Palpations: Abdomen is soft. There is no hepatomegaly, splenomegaly or mass.     Tenderness: There is no abdominal tenderness.  Musculoskeletal:     Right lower leg: No edema.     Left lower leg: No edema.  Lymphadenopathy:     Cervical: No cervical adenopathy.     Right cervical: No superficial, deep or posterior  cervical adenopathy.    Left cervical: No superficial, deep or posterior cervical adenopathy.     Upper Body:     Right upper body: No supraclavicular or axillary adenopathy.     Left upper body: No supraclavicular or axillary adenopathy.  Neurological:     General: No focal deficit present.     Mental Status: She is alert and oriented to person, place, and time.  Psychiatric:        Mood and Affect: Mood normal.        Behavior: Behavior normal.     LABS:      Latest Ref Rng & Units 08/27/2022   12:03 PM 08/22/2022    8:26 AM 08/01/2022    8:07 AM  CBC  WBC 4.0 - 10.5 K/uL 3.7  3.4  8.5   Hemoglobin 12.0 - 15.0 g/dL 16.1  6.4  8.3   Hematocrit 36.0 - 46.0 % 33.0  20.4  26.0   Platelets 150 - 400 K/uL 148  164  308       Latest Ref Rng & Units 08/27/2022   12:03 PM 08/22/2022    8:26 AM 08/01/2022    8:07 AM  CMP  Glucose 70 - 99 mg/dL 161  096  045   BUN 8 - 23 mg/dL 9  14  15    Creatinine 0.44 - 1.00 mg/dL 4.09  8.11  9.14   Sodium 135 - 145 mmol/L 137  136  137   Potassium 3.5 - 5.1 mmol/L 3.8  4.2  4.1   Chloride 98 - 111 mmol/L 103  100  101   CO2 22 - 32 mmol/L 25  26  27    Calcium 8.9 - 10.3 mg/dL 9.5  9.2  8.5   Total Protein 6.5 - 8.1 g/dL 6.4  6.3  5.9   Total Bilirubin 0.3 - 1.2 mg/dL 0.8  0.8  0.6   Alkaline Phos 38 - 126 U/L 107  116  119   AST 15 - 41 U/L 25  22  24    ALT 0 - 44 U/L 23  19  25       No results found for: "CEA1", "CEA" / No results found for: "CEA1", "CEA" No results found for: "PSA1" No results found for: "NWG956" No results found for: "CAN125"  No results found for: "TOTALPROTELP", "ALBUMINELP", "A1GS", "A2GS", "BETS", "BETA2SER", "GAMS", "MSPIKE", "SPEI" No results found for: "TIBC", "FERRITIN", "IRONPCTSAT" No results found for: "LDH"   STUDIES:   No results found.

## 2022-08-27 ENCOUNTER — Inpatient Hospital Stay (HOSPITAL_BASED_OUTPATIENT_CLINIC_OR_DEPARTMENT_OTHER): Payer: No Typology Code available for payment source | Admitting: Hematology

## 2022-08-27 ENCOUNTER — Other Ambulatory Visit: Payer: Self-pay | Admitting: *Deleted

## 2022-08-27 ENCOUNTER — Other Ambulatory Visit: Payer: Self-pay | Admitting: Family Medicine

## 2022-08-27 ENCOUNTER — Inpatient Hospital Stay: Payer: No Typology Code available for payment source

## 2022-08-27 VITALS — BP 137/90 | HR 99 | Temp 96.7°F | Resp 18 | Wt 132.5 lb

## 2022-08-27 DIAGNOSIS — C50811 Malignant neoplasm of overlapping sites of right female breast: Secondary | ICD-10-CM

## 2022-08-27 DIAGNOSIS — Z171 Estrogen receptor negative status [ER-]: Secondary | ICD-10-CM | POA: Diagnosis not present

## 2022-08-27 DIAGNOSIS — Z95828 Presence of other vascular implants and grafts: Secondary | ICD-10-CM

## 2022-08-27 LAB — COMPREHENSIVE METABOLIC PANEL
ALT: 23 U/L (ref 0–44)
AST: 25 U/L (ref 15–41)
Albumin: 3.3 g/dL — ABNORMAL LOW (ref 3.5–5.0)
Alkaline Phosphatase: 107 U/L (ref 38–126)
Anion gap: 9 (ref 5–15)
BUN: 9 mg/dL (ref 8–23)
CO2: 25 mmol/L (ref 22–32)
Calcium: 9.5 mg/dL (ref 8.9–10.3)
Chloride: 103 mmol/L (ref 98–111)
Creatinine, Ser: 0.73 mg/dL (ref 0.44–1.00)
GFR, Estimated: >60 mL/min (ref >60)
Glucose, Bld: 130 mg/dL — ABNORMAL HIGH (ref 70–99)
Potassium: 3.8 mmol/L (ref 3.5–5.1)
Sodium: 137 mmol/L (ref 135–145)
Total Bilirubin: 0.8 mg/dL (ref 0.3–1.2)
Total Protein: 6.4 g/dL — ABNORMAL LOW (ref 6.5–8.1)

## 2022-08-27 LAB — SAMPLE TO BLOOD BANK

## 2022-08-27 LAB — CBC WITH DIFFERENTIAL/PLATELET
Abs Immature Granulocytes: 0.01 10*3/uL (ref 0.00–0.07)
Basophils Absolute: 0 10*3/uL (ref 0.0–0.1)
Basophils Relative: 1 %
Eosinophils Absolute: 0 10*3/uL (ref 0.0–0.5)
Eosinophils Relative: 0 %
HCT: 33 % — ABNORMAL LOW (ref 36.0–46.0)
Hemoglobin: 10.6 g/dL — ABNORMAL LOW (ref 12.0–15.0)
Immature Granulocytes: 0 %
Lymphocytes Relative: 17 %
Lymphs Abs: 0.6 10*3/uL — ABNORMAL LOW (ref 0.7–4.0)
MCH: 31.8 pg (ref 26.0–34.0)
MCHC: 32.1 g/dL (ref 30.0–36.0)
MCV: 99.1 fL (ref 80.0–100.0)
Monocytes Absolute: 0.3 10*3/uL (ref 0.1–1.0)
Monocytes Relative: 9 %
Neutro Abs: 2.7 10*3/uL (ref 1.7–7.7)
Neutrophils Relative %: 73 %
Platelets: 148 10*3/uL — ABNORMAL LOW (ref 150–400)
RBC: 3.33 MIL/uL — ABNORMAL LOW (ref 3.87–5.11)
RDW: 22.5 % — ABNORMAL HIGH (ref 11.5–15.5)
WBC: 3.7 10*3/uL — ABNORMAL LOW (ref 4.0–10.5)
nRBC: 0 % (ref 0.0–0.2)

## 2022-08-27 LAB — MAGNESIUM: Magnesium: 1.7 mg/dL (ref 1.7–2.4)

## 2022-08-27 MED ORDER — HEPARIN SOD (PORK) LOCK FLUSH 100 UNIT/ML IV SOLN
500.0000 [IU] | Freq: Once | INTRAVENOUS | Status: AC
  Administered 2022-08-27: 500 [IU] via INTRAVENOUS

## 2022-08-27 MED ORDER — SODIUM CHLORIDE 0.9% FLUSH
10.0000 mL | Freq: Once | INTRAVENOUS | Status: AC
  Administered 2022-08-27: 10 mL via INTRAVENOUS

## 2022-08-27 MED ORDER — LORAZEPAM 1 MG PO TABS: 1.0000 mg | ORAL_TABLET | Freq: Two times a day (BID) | ORAL | 0 refills | Status: DC | PRN

## 2022-08-27 NOTE — Patient Instructions (Addendum)
[facility] at [facility] Discharge Instructions   You were seen and examined today by Dr. Ellin Saba.  He reviewed the results of your lab work which are normal/stable.   We will proceed with your treatment next week.   Follow up with Dr. Lovell Sheehan tomorrow as scheduled for incision and drainage of the abscess under your left arm.   Return as scheduled.    Thank you for choosing [facility] at [facility] to provide your oncology and hematology care.  To afford each patient quality time with our provider, please arrive at least 15 minutes before your scheduled appointment time.   If you have a lab appointment with the Cancer Center please come in thru the Main Entrance and check in at the main information desk.  You need to re-schedule your appointment should you arrive 10 or more minutes late.  We strive to give you quality time with our providers, and arriving late affects you and other patients whose appointments are after yours.  Also, if you no show three or more times for appointments you may be dismissed from the clinic at the providers discretion.     Again, thank you for choosing [facility].  Our hope is that these requests will decrease the amount of time that you wait before being seen by our physicians.       _____________________________________________________________  Should you have questions after your visit to [facility], please contact our office at [phone] and follow the prompts.  Our office hours are 8:00 a.m. and 4:30 p.m. Monday - Friday.  Please note that voicemails left after 4:00 p.m. may not be returned until the following business day.  We are closed weekends and major holidays.  You do have access to a nurse 24-7, just call the main number to the clinic [phone] and do not press any options, hold on the line and a nurse will answer the phone.    For prescription refill requests,  have your pharmacy contact our office and allow 72 hours.    Due to Covid, you will need to wear a mask upon entering the hospital. If you do not have a mask, a mask will be given to you at the Main Entrance upon arrival. For doctor visits, patients may have 1 support person age 23 or older with them. For treatment visits, patients can not have anyone with them due to social distancing guidelines and our immunocompromised population.

## 2022-08-27 NOTE — Addendum Note (Signed)
Addended by: Cori Razor on: 08/27/2022 01:27 PM   Modules accepted: Orders

## 2022-08-28 ENCOUNTER — Ambulatory Visit (INDEPENDENT_AMBULATORY_CARE_PROVIDER_SITE_OTHER): Payer: No Typology Code available for payment source | Admitting: General Surgery

## 2022-08-28 ENCOUNTER — Other Ambulatory Visit: Payer: Self-pay | Admitting: Hematology

## 2022-08-28 ENCOUNTER — Ambulatory Visit: Payer: No Typology Code available for payment source

## 2022-08-28 ENCOUNTER — Encounter: Payer: Self-pay | Admitting: General Surgery

## 2022-08-28 VITALS — BP 108/52 | HR 89 | Temp 98.3°F | Resp 12 | Ht 63.0 in | Wt 133.0 lb

## 2022-08-28 DIAGNOSIS — L723 Sebaceous cyst: Secondary | ICD-10-CM

## 2022-08-28 DIAGNOSIS — Z171 Estrogen receptor negative status [ER-]: Secondary | ICD-10-CM

## 2022-08-28 NOTE — Progress Notes (Signed)
Subjective:     Marilyn Ford  Patient referred back to my care by Dr. Ellin Saba for an abscess in the left axilla.  She has been on antibiotic for 1 week and has had some purulent drainage.  Patient is undergoing chemotherapy for right breast cancer.  This is currently on hold. Objective:    BP (!) 108/52   Pulse 89   Temp 98.3 F (36.8 C) (Oral)   Resp 12   Ht  (1.6 m)   Wt 133 lb (60.3 kg)   SpO2 95%   BMI 23.56 kg/m   General:  alert, cooperative, and no distress  Head is normocephalic, atraumatic Lungs clear to auscultation with good breath sounds bilaterally Heart examination reveals regular rate and rhythm without S3, S4, murmurs Left axilla with an approximately 2 cm raised sebaceous cyst.  Aero freeze was used for local anesthetic.  Incision and drainage of the left axillary sebaceous cyst was performed.  Sebum was obtained and expressed as much as possible.  Patient started having pain.  The wound was cleaned with Q-tip and peroxide.     Assessment:    Infected sebaceous cyst, left axilla    Plan:   Finish antibiotic course.  Clean wound with a Q-tip and peroxide twice a day.  May shower.  Follow-up here in 1 week for wound check.

## 2022-08-30 ENCOUNTER — Ambulatory Visit: Payer: No Typology Code available for payment source

## 2022-09-04 ENCOUNTER — Encounter: Payer: Self-pay | Admitting: General Surgery

## 2022-09-04 ENCOUNTER — Ambulatory Visit: Payer: No Typology Code available for payment source

## 2022-09-04 ENCOUNTER — Ambulatory Visit (INDEPENDENT_AMBULATORY_CARE_PROVIDER_SITE_OTHER): Payer: No Typology Code available for payment source | Admitting: General Surgery

## 2022-09-04 VITALS — BP 127/83 | HR 88 | Temp 98.3°F | Resp 14 | Ht 62.0 in | Wt 133.0 lb

## 2022-09-04 VITALS — Ht 62.0 in | Wt 132.0 lb

## 2022-09-04 DIAGNOSIS — L723 Sebaceous cyst: Secondary | ICD-10-CM

## 2022-09-04 DIAGNOSIS — Z0000 Encounter for general adult medical examination without abnormal findings: Secondary | ICD-10-CM

## 2022-09-04 NOTE — Progress Notes (Signed)
[facility] [address], [place] [address]    Clinic Day:  09/05/2022  Referring physician: Mechele Claude, MD  Patient Care Team: Mechele Claude, MD as PCP - General (Family Medicine) Mechele Claude, MD (Family Medicine) Danella Maiers, [facility] as Pharmacist (Family Medicine) Marguerita Merles, Reuel Boom, MD as Consulting Physician (Gastroenterology) Franky Macho, MD as Consulting Physician (General Surgery) Pershing Proud, RN as Oncology Nurse Navigator [name], RN as Oncology Nurse Navigator [name], MD as Medical Oncologist (Medical Oncology) Therese Sarah, RN as Oncology Nurse Navigator (Medical Oncology) [name], MD as Consulting Physician (Hematology) Mechele Claude, MD as Referring Physician (Family Medicine)   ASSESSMENT & PLAN:   Assessment: 1.  Inflammatory right breast cancer: - Bilateral diagnostic mammogram (02/20/2022): Suspicious right axillary lymphadenopathy.  Indeterminate intramammary lymph node in the right breast at 9:00.  New right breast skin and trabecular thickening, related to vascular congestion from enlarged lymph nodes in the right axilla. - Right axillary lymph node core biopsy (02/20/2022): Morphology and IHC compatible with primary breast cancer with differential diagnosis of urothelial carcinoma and primary lung carcinoma (squamous).  Grade 3.  ER 0%, PR 0%, HER2 2+, Ki-67 60%, HER2 negative by FISH. - Right breast lymph node biopsy at 9:00 (02/20/2022): Negative for carcinoma. - MRI breast (03/02/2022): In the upper outer right breast, mid to posterior depth there is a patchy clumped non-mass enhancement in a linear orientation spanning approximately 3.8 cm.  There is diffuse thickening of the skin in the right breast with skin enhancement.  Left breast with no mass or abnormal enhancement.  Numerous bulky matted lymph nodes in the right axilla. - Right breast UOQ biopsy (03/19/2022): Benign  breast with fibrocystic changes including stromal fibrosis, adenosis, usual ductal hyperplasia.  Negative for carcinoma. - PET scan (03/22/2022): Bulky hypermetabolic right axillary and subpectoral lymphadenopathy.  No other definite new sites of metastatic disease.  6 mm right lower lobe lung nodule with no FDG uptake.    Plan: 1.  Triple negative right breast cancer: - She has completed 2 cycles of AC and pembrolizumab. - Cycle 3 was delayed due to left axillary abscess. - Labs today: Normal LFTs.  CBC was grossly normal with mild anemia from myelosuppression.  TSH is 3.6. - Recommend proceeding with cycle 3 today.  We will check her blood counts in 2 weeks to see if she needs any transfusion.  Otherwise RTC 3 weeks for follow-up prior to cycle 4.   2.  Hypomagnesemia: - Continue magnesium twice daily.  Magnesium is normal.   3.  Weight loss: - She has been eating well and maintaining her weight.   4.  Left axillary abscess: - She was seen by Dr. Lovell Sheehan and had incision and drainage done.  She completed antibiotics.  Today the wound looks better and healed.    No orders of the defined types were placed in this encounter.     I,Marilyn Ford,acting as a Neurosurgeon for [name], MD.,have documented all relevant documentation on the behalf of [name], MD,as directed by  [name], MD while in the presence of [name], MD.   I, [name] MD, have reviewed the above documentation for accuracy and completeness, and I agree with the above.   [name], MD   5/1/20246:05 PM  CHIEF COMPLAINT:   Diagnosis: locally advanced TNBC    Cancer Staging  Malignant neoplasm of overlapping sites of right female breast [facility]) Staging form: Breast,  AJCC 8th Edition - Clinical stage from 03/08/2022: Stage IIIC (cT4d, cN2, cM0, G3, ER-, PR-, HER2-) - Signed by Malachy Mood, MD on 03/08/2022    Prior Therapy: none  Current Therapy:   Pembrolizumab, carboplatin and paclitaxel followed by pembrolizumab, Adriamycin and Cytoxan    HISTORY OF PRESENT ILLNESS:   Oncology History Overview Note   Cancer Staging  Malignant neoplasm of overlapping sites of right female breast [facility]) Staging form: Breast, AJCC 8th Edition - Clinical stage from 03/08/2022: Stage IIIC (cT2, cN2, cM0, G3, ER-, PR-, HER2-) - Signed by Malachy Mood, MD on 03/08/2022    Malignant neoplasm of overlapping sites of right female breast [facility])  02/20/2022 Mammogram   CLINICAL DATA:  62 year old female recalled from screening mammography 03/01/2021 for right breast calcifications and subsequent benign discordant biopsy of these calcifications in the central posterior right breast December 2022 with excision recommended. The patient initially followed up with surgery in April however canceled her scheduled surgery and most recently followed up with Dr. Henreitta Leber September 2023 with diagnostic imaging, possible RF tag placement and subsequent excision recommended.   EXAM: DIGITAL DIAGNOSTIC BILATERAL MAMMOGRAM WITH TOMOSYNTHESIS; ULTRASOUND RIGHT BREAST LIMITED  MPRESSION: 1.  Suspicious right axillary lymphadenopathy.   2. Indeterminate intramammary lymph node in the right breast at 9 o'clock.   3. 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   4. Decreased calcifications noted at prior benign biopsy site in the lower central posterior right breast at site of X shaped biopsy marking clip.   02/20/2022 Initial Biopsy   FINAL MICROSCOPIC DIAGNOSIS:   A. AXILLA, RIGHT, LYMPH NODE, NEEDLE CORE BIOPSY:  - Positive for carcinoma (see Comment)   B. LYMPH NODE, RIGHT BREAST, BIOPSY:  - Negative for carcinoma   COMMENT:  Part A: Morphology and immunohistochemical staining are most compatible with primary breast  carcinoma with metaplastic changes, however differential diagnosis also includes urothelial carcinoma and less likely primary lung carcinoma (squamous).  No lymphoid tissue is identified.  Clinical and radiologic correlation is suggested.   ADDENDUM:  In case of a breast origin, the appropriate grade would be grade 3  (3+3+2)   ADDENDUM:  PROGNOSTIC INDICATOR RESULTS:  The tumor cells are EQUIVOCAL for Her2 (2+).  Estrogen Receptor:       0%, NEGATIVE  Progesterone Receptor:   0%, NEGATIVE  Proliferation Marker Ki-67:   60%   ADDENDUM:  FLOURESCENCE IN-SITU HYBRIDIZATION RESULTS:  GROUP 5:   HER2 **NEGATIVE**    03/02/2022 Imaging   EXAM: BILATERAL BREAST MRI WITH AND WITHOUT CONTRAST  IMPRESSION: 1. There is a suspicious 3.8 cm area of patchy non mass enhancement in a linear orientation in the slightly upper outer right breast in the mid to posterior depth spanning 3.8 cm.   2. Diffuse skin thickening with enhancement of the skin, concerning for inflammatory breast cancer.   3. Numerous bulky matted lymph nodes in the right axilla, one of which corresponds with the biopsy-proven metastatic lymph node.   4.  No evidence of left breast malignancy.   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)   03/08/2022 Cancer Staging   Staging form: Breast, AJCC 8th Edition - Clinical stage from 03/08/2022: Stage IIIC (cT4d, cN2, cM0, G3, ER-, PR-, HER2-) - Signed by Malachy Mood, MD on 03/08/2022 Histologic grading system: 3 grade system  03/19/2022 Pathology Results   Diagnosis Breast, right, needle core biopsy, upper outer quadrant, barbell clip BENIGN BREAST WITH FIBROCYSTIC CHANGES INCLUDING STROMAL FIBROSIS, ADENOSIS AND USUAL DUCT HYPERPLASIA BENIGN FIBROMATOID CHANGE NEGATIVE FOR MICROCALCIFICATIONS NEGATIVE FOR CARCINOMA   03/22/2022 PET scan   IMPRESSION: Bulky hypermetabolic right axillary and subpectoral  lymphadenopathy, consistent with metastatic disease.   No other definite sites of metastatic disease identified.   6 mm right lower lobe pulmonary nodule shows no FDG uptake, but is too small to definitively characterize by PET. Recommend continued follow-up by chest CT in 3-4 months.   Aortic Atherosclerosis (ICD10-I70.0).   04/02/2022 -  Chemotherapy   Patient is on Treatment Plan : BREAST Pembrolizumab (200) D1 + Carboplatin (5) D1 + Paclitaxel (80) D1,8,15 q21d X 4 cycles / Pembrolizumab (200) D1 + AC D1 q21d x 4 cycles      Genetic Testing   Negative genetic testing. No pathogenic variants identified on the [facility] Common Hereditary Cancers+RNA panel. The report date is 05/31/2022.  The Common Hereditary Cancers Panel + RNA offered by [facility] includes sequencing and/or deletion duplication testing of the following 48 genes: APC*, ATM*, AXIN2, BAP1, BARD1, BMPR1A, BRCA1, BRCA2, BRIP1, CDH1, CDK4, CDKN2A (p14ARF), CDKN2A (p16INK4a), CHEK2, CTNNA1, DICER1*, EPCAM*, FH*, GREM1*, HOXB13, KIT, MBD4, MEN1*, MLH1*, MSH2*, MSH3*, MSH6*, MUTYH, NF1*, NTHL1, PALB2, PDGFRA, PMS2*, POLD1*, POLE, PTEN*, RAD51C, RAD51D, SDHA*, SDHB, SDHC*, SDHD, SMAD4, SMARCA4, STK11, TP53, TSC1*, TSC2, VHL.       INTERVAL HISTORY:   Marilyn Ford is a 62 y.o. female presenting to clinic today for follow up of locally advanced TNBC. She was last seen by me on 08/27/22.  Today, she states that she is doing well overall. Her appetite level is at 100%. Her energy level is at 60%.  PAST MEDICAL HISTORY:   Past Medical History: Past Medical History:  Diagnosis Date   Allergy    Anxiety    Breast cancer (HCC)    Diabetes mellitus without complication (HCC)    GERD (gastroesophageal reflux disease)    Heart murmur    as a child   History of kidney stones    Hypertension    Pneumonia    Stroke [facility])    Vaginal Pap smear, abnormal     Surgical History: Past Surgical History:  Procedure Laterality Date   CESAREAN  SECTION     CHOLECYSTECTOMY     COLONOSCOPY WITH PROPOFOL N/A 07/19/2020   Procedure: COLONOSCOPY WITH PROPOFOL;  Surgeon: Dolores Frame, MD;  Location: [facility];  Service: Gastroenterology;  Laterality: N/A;  AM   POLYPECTOMY  07/19/2020   Procedure: POLYPECTOMY;  Surgeon: Dolores Frame, MD;  Location: [facility];  Service: Gastroenterology;;   PORTACATH PLACEMENT N/A 03/20/2022   Procedure: INSERTION PORT-A-CATH;  Surgeon: Abigail Miyamoto, MD;  Location: [facility];  Service: General;  Laterality: N/A;    Social History: Social History   Socioeconomic History   Marital status: Married    Spouse name: Fayrene Fearing   Number of children: 2   Years of education: 10   Highest education level: 10th grade  Occupational History   Occupation: disabled  Tobacco Use   Smoking status: Former    Packs/day: 0.50    Years: 25.00    Additional pack years: 0.00    Total pack years: 12.50    Types: Cigarettes    Start date: 12/08/1999    Quit date: 03/12/2015    Years since quitting: 7.4   Smokeless tobacco: Never  Vaping  Use   Vaping Use: Never used  Substance and Sexual Activity   Alcohol use: Yes    Alcohol/week: 2.0 - 3.0 standard drinks of alcohol    Types: 2 - 3 Glasses of wine per week   Drug use: No   Sexual activity: Not Currently    Birth control/protection: Abstinence, Post-menopausal  Other Topics Concern   Not on file  Social History Narrative   Volunteers at [employer] for a few hours every day.    She really enjoys getting out of of the house and working there.    Social Determinants of Health   Financial Resource Strain: Low Risk  (09/04/2022)   Overall Financial Resource Strain (CARDIA)    Difficulty of Paying Living Expenses: Not hard at all  Food Insecurity: No Food Insecurity (09/04/2022)   Hunger Vital Sign    Worried About Running Out of Food in the Last Year: Never true    Ran Out of Food in the Last Year: Never true   Transportation Needs: No Transportation Needs (09/04/2022)   PRAPARE - Administrator, Civil Service (Medical): No    Lack of Transportation (Non-Medical): No  Physical Activity: Inactive (09/04/2022)   Exercise Vital Sign    Days of Exercise per Week: 0 days    Minutes of Exercise per Session: 0 min  Stress: No Stress Concern Present (09/04/2022)   [employer] of Occupational Health - Occupational Stress Questionnaire    Feeling of Stress : Not at all  Social Connections: Moderately Integrated (09/04/2022)   Social Connection and Isolation Panel [NHANES]    Frequency of Communication with Friends and Family: More than three times a week    Frequency of Social Gatherings with Friends and Family: More than three times a week    Attends Religious Services: More than 4 times per year    Active Member of [employer] or Organizations: No    Attends Banker Meetings: Never    Marital Status: Married  Catering manager Violence: Not At Risk (09/04/2022)   Humiliation, Afraid, Rape, and Kick questionnaire    Fear of Current or Ex-Partner: No    Emotionally Abused: No    Physically Abused: No    Sexually Abused: No    Family History: Family History  Problem Relation Age of Onset   Diabetes Mother    Uterine cancer Mother 30 - 60   Diabetes Brother    Breast cancer Maternal Aunt        dx >50, d. from cancer   Cancer Maternal Grandmother        unk type, "back cancer?"    Current Medications:  Current Outpatient Medications:    Alcohol Swabs (B-D SINGLE USE SWABS REGULAR) PADS, Test BS daily and as needed Dx E11.9, Disp: 100 each, Rfl: 3   aluminum-magnesium hydroxide-simethicone (MAALOX) 200-200-20 MG/5ML SUSP, Take 30 mLs by mouth 4 (four) times daily -  before meals and at bedtime., Disp: 480 mL, Rfl: 2   amLODipine (NORVASC) 10 MG tablet, 1 tablet daily, Disp: 90 tablet, Rfl: 3   Apple Cider Vinegar 500 MG TABS, Take 500 mg by mouth in the morning., Disp: ,  Rfl:    aspirin 81 MG EC tablet, Take 1 tablet (81 mg total) by mouth daily., Disp: 30 tablet, Rfl: 0   atorvastatin (LIPITOR) 80 MG tablet, TAKE 1 TABLET EVERY DAY AT 6PM, Disp: 90 tablet, Rfl: 3   Blood Glucose Calibration (TRUE METRIX LEVEL 1)  Low SOLN, Use with glucometer Dx E11.9, Disp: 3 each, Rfl: 0   Blood Glucose Monitoring Suppl (TRUE METRIX AIR GLUCOSE METER) w/Device KIT, Test BS daily and as needed Dx E11.9, Disp: 1 kit, Rfl: 0   Calcium Carb-Cholecalciferol (CALCIUM-VITAMIN D) 600-400 MG-UNIT TABS, Take 1 tablet by mouth in the morning., Disp: , Rfl:    cephALEXin (KEFLEX) 500 MG capsule, Take 1 capsule (500 mg total) by mouth 3 (three) times daily., Disp: 21 capsule, Rfl: 0   Coenzyme Q10 (COQ10) 100 MG CAPS, Take 100 mg by mouth in the morning., Disp: , Rfl:    Cranberry 425 MG CAPS, Take 425 mg by mouth in the morning and at bedtime., Disp: , Rfl:    Dulaglutide (TRULICITY) 4.5 MG/0.5ML SOPN, Inject 4.5 mg as directed once a week., Disp: 6 mL, Rfl: 1   Flaxseed, Linseed, (FLAXSEED OIL) 1000 MG CAPS, Take 1,000 mg by mouth in the morning., Disp: , Rfl:    gabapentin (NEURONTIN) 300 MG capsule, Take 1 capsule (300 mg total) by mouth 3 (three) times daily., Disp: 90 capsule, Rfl: 0   glucose blood (TRUE METRIX BLOOD GLUCOSE TEST) test strip, Test BS daily and as needed Dx E11.9, Disp: 100 each, Rfl: 3   icosapent Ethyl (VASCEPA) 1 g capsule, Take 2 g by mouth 2 (two) times daily., Disp: , Rfl:    Krill Oil 500 MG CAPS, Take 3 capsules (1,500 mg total) by mouth in the morning and at bedtime., Disp: , Rfl:    lidocaine (XYLOCAINE) 2 % solution, Use as directed 15 mLs in the mouth or throat every 6 (six) hours as needed for mouth pain., Disp: 250 mL, Rfl: 1   LORazepam (ATIVAN) 1 MG tablet, Take 1 tablet (1 mg total) by mouth 2 (two) times daily as needed for anxiety., Disp: 10 tablet, Rfl: 0   magnesium oxide (MAG-OX) 400 (240 Mg) MG tablet, TAKE 1 TABLET BY MOUTH TWICE A DAY, Disp: 60  tablet, Rfl: 1   meloxicam (MOBIC) 15 MG tablet, TAKE 1 TABLET (15 MG TOTAL) BY MOUTH DAILY. FOR JOINT AND MUSCLE PAIN, Disp: 90 tablet, Rfl: 1   metoprolol (TOPROL-XL) 200 MG 24 hr tablet, TAKE 1 TABLET ONE TIME DAILY, WITH OR IMMEDIATELY FOLLOWING A MEAL, Disp: 90 tablet, Rfl: 3   Multiple Vitamin (MULTIVITAMIN) capsule, Take 1 capsule by mouth in the morning., Disp: , Rfl:    ondansetron (ZOFRAN) 8 MG tablet, Take 1 tablet (8 mg total) by mouth every 8 (eight) hours as needed for nausea or vomiting., Disp: 30 tablet, Rfl: 3   pantoprazole (PROTONIX) 40 MG tablet, TAKE 1 TABLET twice daily FOR STOMACH, Disp: 180 tablet, Rfl: 3   traMADol (ULTRAM) 50 MG tablet, Take 1 tablet (50 mg total) by mouth every 6 (six) hours as needed for moderate pain or severe pain., Disp: 20 tablet, Rfl: 0   traZODone (DESYREL) 150 MG tablet, TAKE 1 OR 2 TABLETS AT BEDTIME FOR SLEEP, Disp: 180 tablet, Rfl: 3   TRUEplus Lancets 33G MISC, Test BS daily and as needed Dx E11.9, Disp: 100 each, Rfl: 3   valsartan (DIOVAN) 320 MG tablet, Take 1 tablet (320 mg total) by mouth daily. For blood pressure., Disp: 90 tablet, Rfl: 1   zinc gluconate 50 MG tablet, Take 50 mg by mouth daily., Disp: , Rfl:    lidocaine-prilocaine (EMLA) cream, Apply to affected area once (Patient not taking: Reported on 09/05/2022), Disp: 30 g, Rfl: 3   prochlorperazine (COMPAZINE) 10  MG tablet, Take 1 tablet (10 mg total) by mouth every 6 (six) hours as needed for nausea or vomiting. (Patient not taking: Reported on 09/05/2022), Disp: 30 tablet, Rfl: 1 No current facility-administered medications for this visit.  Facility-Administered Medications Ordered in Other Visits:    sodium chloride flush (NS) 0.9 % injection 10 mL, 10 mL, Intracatheter, PRN, [name], MD, 10 mL at 09/05/22 1247   Allergies: Allergies  Allergen Reactions   Penicillins Shortness Of Breath   Sulfa Antibiotics Rash    REVIEW OF SYSTEMS:   Review of Systems   Constitutional:  Negative for chills, fatigue and fever.  HENT:   Negative for lump/mass, mouth sores, nosebleeds, sore throat and trouble swallowing.   Eyes:  Negative for eye problems.  Respiratory:  Negative for cough and shortness of breath.   Cardiovascular:  Negative for chest pain, leg swelling and palpitations.  Gastrointestinal:  Positive for diarrhea. Negative for abdominal pain, constipation, nausea and vomiting.  Genitourinary:  Negative for bladder incontinence, difficulty urinating, dysuria, frequency, hematuria and nocturia.   Musculoskeletal:  Negative for arthralgias, back pain, flank pain, myalgias and neck pain.  Skin:  Negative for itching and rash.  Neurological:  Negative for dizziness, headaches and numbness.  Hematological:  Does not bruise/bleed easily.  Psychiatric/Behavioral:  Negative for depression, sleep disturbance and suicidal ideas. The patient is not nervous/anxious.   All other systems reviewed and are negative.    VITALS:   There were no vitals taken for this visit.  Wt Readings from Last 3 Encounters:  09/05/22 132 lb (59.9 kg)  09/04/22 133 lb (60.3 kg)  09/04/22 132 lb (59.9 kg)    There is no height or weight on file to calculate BMI.  Performance status (ECOG): 1 - Symptomatic but completely ambulatory  PHYSICAL EXAM:   Physical Exam Vitals and nursing note reviewed. Exam conducted with a chaperone present.  Constitutional:      Appearance: Normal appearance.  Cardiovascular:     Rate and Rhythm: Normal rate and regular rhythm.     Pulses: Normal pulses.     Heart sounds: Normal heart sounds.  Pulmonary:     Effort: Pulmonary effort is normal.     Breath sounds: Normal breath sounds.  Abdominal:     Palpations: Abdomen is soft. There is no hepatomegaly, splenomegaly or mass.     Tenderness: There is no abdominal tenderness.  Musculoskeletal:     Right lower leg: No edema.     Left lower leg: No edema.  Lymphadenopathy:      Cervical: No cervical adenopathy.     Right cervical: No superficial, deep or posterior cervical adenopathy.    Left cervical: No superficial, deep or posterior cervical adenopathy.     Upper Body:     Right upper body: No supraclavicular or axillary adenopathy.     Left upper body: No supraclavicular or axillary adenopathy.  Neurological:     General: No focal deficit present.     Mental Status: She is alert and oriented to person, place, and time.  Psychiatric:        Mood and Affect: Mood normal.        Behavior: Behavior normal.     LABS:      Latest Ref Rng & Units 09/05/2022    8:29 AM 08/27/2022   12:03 PM 08/22/2022    8:26 AM  CBC  WBC 4.0 - 10.5 K/uL 4.2  3.7  3.4   Hemoglobin 12.0 -  15.0 g/dL 9.9  16.1  6.4   Hematocrit 36.0 - 46.0 % 29.4  33.0  20.4   Platelets 150 - 400 K/uL 165  148  164       Latest Ref Rng & Units 09/05/2022    8:29 AM 08/27/2022   12:03 PM 08/22/2022    8:26 AM  CMP  Glucose 70 - 99 mg/dL 096  045  409   BUN 8 - 23 mg/dL 13  9  14    Creatinine 0.44 - 1.00 mg/dL 8.11  9.14  7.82   Sodium 135 - 145 mmol/L 137  137  136   Potassium 3.5 - 5.1 mmol/L 3.8  3.8  4.2   Chloride 98 - 111 mmol/L 100  103  100   CO2 22 - 32 mmol/L 29  25  26    Calcium 8.9 - 10.3 mg/dL 9.4  9.5  9.2   Total Protein 6.5 - 8.1 g/dL 6.4  6.4  6.3   Total Bilirubin 0.3 - 1.2 mg/dL 0.8  0.8  0.8   Alkaline Phos 38 - 126 U/L 98  107  116   AST 15 - 41 U/L 23  25  22    ALT 0 - 44 U/L 27  23  19       No results found for: "CEA1", "CEA" / No results found for: "CEA1", "CEA" No results found for: "PSA1" No results found for: "NFA213" No results found for: "CAN125"  No results found for: "TOTALPROTELP", "ALBUMINELP", "A1GS", "A2GS", "BETS", "BETA2SER", "GAMS", "MSPIKE", "SPEI" No results found for: "TIBC", "FERRITIN", "IRONPCTSAT" No results found for: "LDH"   STUDIES:   No results found.

## 2022-09-04 NOTE — Progress Notes (Signed)
Subjective:   Marilyn Ford is a 62 y.o. female who presents for Medicare Annual (Subsequent) preventive examination. I connected with  MONTSERRATH MADDING on 09/04/22 by a audio enabled telemedicine application and verified that I am speaking with the correct person using two identifiers.  Patient Location: Home  Provider Location: Home Office  I discussed the limitations of evaluation and management by telemedicine. The patient expressed understanding and agreed to proceed.  Review of Systems     Cardiac Risk Factors include: advanced age (>34men, >74 women);diabetes mellitus;hypertension;dyslipidemia     Objective:    Today's Vitals   09/04/22 0848  Weight: 132 lb (59.9 kg)  Height: 5\' 2"  (1.575 m)   Body mass index is 24.14 kg/m.     09/04/2022    8:52 AM 08/27/2022   12:36 PM 08/22/2022    9:25 AM 08/01/2022    9:17 AM 07/23/2022   10:08 AM 07/17/2022   10:56 AM 07/13/2022    2:03 PM  Advanced Directives  Does Patient Have a Medical Advance Directive? No No No No No No No  Would patient like information on creating a medical advance directive? No - Patient declined No - Patient declined No - Patient declined No - Patient declined No - Patient declined No - Patient declined No - Patient declined    Current Medications (verified) Outpatient Encounter Medications as of 09/04/2022  Medication Sig   Alcohol Swabs (B-D SINGLE USE SWABS REGULAR) PADS Test BS daily and as needed Dx E11.9   aluminum-magnesium hydroxide-simethicone (MAALOX) 200-200-20 MG/5ML SUSP Take 30 mLs by mouth 4 (four) times daily -  before meals and at bedtime.   amLODipine (NORVASC) 10 MG tablet 1 tablet daily   Apple Cider Vinegar 500 MG TABS Take 500 mg by mouth in the morning.   aspirin 81 MG EC tablet Take 1 tablet (81 mg total) by mouth daily.   atorvastatin (LIPITOR) 80 MG tablet TAKE 1 TABLET EVERY DAY AT 6PM   Blood Glucose Calibration (TRUE METRIX LEVEL 1) Low SOLN Use with glucometer Dx E11.9    Blood Glucose Monitoring Suppl (TRUE METRIX AIR GLUCOSE METER) w/Device KIT Test BS daily and as needed Dx E11.9   Calcium Carb-Cholecalciferol (CALCIUM-VITAMIN D) 600-400 MG-UNIT TABS Take 1 tablet by mouth in the morning.   cephALEXin (KEFLEX) 500 MG capsule Take 1 capsule (500 mg total) by mouth 3 (three) times daily.   Coenzyme Q10 (COQ10) 100 MG CAPS Take 100 mg by mouth in the morning.   Cranberry 425 MG CAPS Take 425 mg by mouth in the morning and at bedtime.   Dulaglutide (TRULICITY) 4.5 MG/0.5ML SOPN Inject 4.5 mg as directed once a week.   Flaxseed, Linseed, (FLAXSEED OIL) 1000 MG CAPS Take 1,000 mg by mouth in the morning.   gabapentin (NEURONTIN) 300 MG capsule Take 1 capsule (300 mg total) by mouth 3 (three) times daily.   glucose blood (TRUE METRIX BLOOD GLUCOSE TEST) test strip Test BS daily and as needed Dx E11.9   icosapent Ethyl (VASCEPA) 1 g capsule Take 2 g by mouth 2 (two) times daily.   Krill Oil 500 MG CAPS Take 3 capsules (1,500 mg total) by mouth in the morning and at bedtime.   lidocaine (XYLOCAINE) 2 % solution Use as directed 15 mLs in the mouth or throat every 6 (six) hours as needed for mouth pain.   lidocaine-prilocaine (EMLA) cream Apply to affected area once   LORazepam (ATIVAN) 1 MG tablet Take  1 tablet (1 mg total) by mouth 2 (two) times daily as needed for anxiety.   magnesium oxide (MAG-OX) 400 (240 Mg) MG tablet TAKE 1 TABLET BY MOUTH TWICE A DAY   meloxicam (MOBIC) 15 MG tablet TAKE 1 TABLET (15 MG TOTAL) BY MOUTH DAILY. FOR JOINT AND MUSCLE PAIN   metoprolol (TOPROL-XL) 200 MG 24 hr tablet TAKE 1 TABLET ONE TIME DAILY, WITH OR IMMEDIATELY FOLLOWING A MEAL   Multiple Vitamin (MULTIVITAMIN) capsule Take 1 capsule by mouth in the morning.   ondansetron (ZOFRAN) 8 MG tablet Take 1 tablet (8 mg total) by mouth every 8 (eight) hours as needed for nausea or vomiting.   pantoprazole (PROTONIX) 40 MG tablet TAKE 1 TABLET twice daily FOR STOMACH   prochlorperazine  (COMPAZINE) 10 MG tablet Take 1 tablet (10 mg total) by mouth every 6 (six) hours as needed for nausea or vomiting.   traMADol (ULTRAM) 50 MG tablet Take 1 tablet (50 mg total) by mouth every 6 (six) hours as needed for moderate pain or severe pain.   traZODone (DESYREL) 150 MG tablet TAKE 1 OR 2 TABLETS AT BEDTIME FOR SLEEP   TRUEplus Lancets 33G MISC Test BS daily and as needed Dx E11.9   valsartan (DIOVAN) 320 MG tablet Take 1 tablet (320 mg total) by mouth daily. For blood pressure.   zinc gluconate 50 MG tablet Take 50 mg by mouth daily.   OLANZapine (ZYPREXA) 5 MG tablet Take 1 tablet (5 mg total) by mouth at bedtime.   No facility-administered encounter medications on file as of 09/04/2022.    Allergies (verified) Penicillins and Sulfa antibiotics   History: Past Medical History:  Diagnosis Date   Allergy    Anxiety    Breast cancer (HCC)    Diabetes mellitus without complication (HCC)    GERD (gastroesophageal reflux disease)    Heart murmur    as a child   History of kidney stones    Hypertension    Pneumonia    Stroke [facility])    Vaginal Pap smear, abnormal    Past Surgical History:  Procedure Laterality Date   CESAREAN SECTION     CHOLECYSTECTOMY     COLONOSCOPY WITH PROPOFOL N/A 07/19/2020   Procedure: COLONOSCOPY WITH PROPOFOL;  Surgeon: Dolores Frame, MD;  Location: [facility];  Service: Gastroenterology;  Laterality: N/A;  AM   POLYPECTOMY  07/19/2020   Procedure: POLYPECTOMY;  Surgeon: Marguerita Merles, Reuel Boom, MD;  Location: [facility];  Service: Gastroenterology;;   PORTACATH PLACEMENT N/A 03/20/2022   Procedure: INSERTION PORT-A-CATH;  Surgeon: Abigail Miyamoto, MD;  Location: [facility];  Service: General;  Laterality: N/A;   Family History  Problem Relation Age of Onset   Diabetes Mother    Uterine cancer Mother 56 - 34   Diabetes Brother    Breast cancer Maternal Aunt        dx >50, d. from cancer   Cancer Maternal Grandmother         unk type, "back cancer?"   Social History   Socioeconomic History   Marital status: Married    Spouse name: Fayrene Fearing   Number of children: 2   Years of education: 10   Highest education level: 10th grade  Occupational History   Occupation: disabled  Tobacco Use   Smoking status: Former    Packs/day: 0.50    Years: 25.00    Additional pack years: 0.00    Total pack years: 12.50    Types:  Cigarettes    Start date: 12/08/1999    Quit date: 03/12/2015    Years since quitting: 7.4   Smokeless tobacco: Never  Vaping Use   Vaping Use: Never used  Substance and Sexual Activity   Alcohol use: Yes    Alcohol/week: 2.0 - 3.0 standard drinks of alcohol    Types: 2 - 3 Glasses of wine per week   Drug use: No   Sexual activity: Not Currently    Birth control/protection: Abstinence, Post-menopausal  Other Topics Concern   Not on file  Social History Narrative   Volunteers at [employer] for a few hours every day.    She really enjoys getting out of of the house and working there.    Social Determinants of Health   Financial Resource Strain: Low Risk  (09/04/2022)   Overall Financial Resource Strain (CARDIA)    Difficulty of Paying Living Expenses: Not hard at all  Food Insecurity: No Food Insecurity (09/04/2022)   Hunger Vital Sign    Worried About Running Out of Food in the Last Year: Never true    Ran Out of Food in the Last Year: Never true  Transportation Needs: No Transportation Needs (09/04/2022)   PRAPARE - Administrator, Civil Service (Medical): No    Lack of Transportation (Non-Medical): No  Physical Activity: Inactive (09/04/2022)   Exercise Vital Sign    Days of Exercise per Week: 0 days    Minutes of Exercise per Session: 0 min  Stress: No Stress Concern Present (09/04/2022)   [employer] of Occupational Health - Occupational Stress Questionnaire    Feeling of Stress : Not at all  Social Connections: Moderately Integrated (09/04/2022)   Social  Connection and Isolation Panel [NHANES]    Frequency of Communication with Friends and Family: More than three times a week    Frequency of Social Gatherings with Friends and Family: More than three times a week    Attends Religious Services: More than 4 times per year    Active Member of [employer] or Organizations: No    Attends Engineer, structural: Never    Marital Status: Married    Tobacco Counseling Counseling given: Not Answered   Clinical Intake:  Pre-visit preparation completed: Yes  Pain : No/denies pain     Nutritional Risks: None Diabetes: Yes CBG done?: No Did pt. bring in CBG monitor from home?: No  How often do you need to have someone help you when you read instructions, pamphlets, or other written materials from your doctor or pharmacy?: 1 - Never  Diabetic?yes Nutrition Risk Assessment:  Has the patient had any N/V/D within the last 2 months?  Yes  Does the patient have any non-healing wounds?  No  Has the patient had any unintentional weight loss or weight gain?  No   Diabetes:  Is the patient diabetic?  Yes  If diabetic, was a CBG obtained today?  No  Did the patient bring in their glucometer from home?  No  How often do you monitor your CBG's? Weekly .   Financial Strains and Diabetes Management:  Are you having any financial strains with the device, your supplies or your medication? No .  Does the patient want to be seen by Chronic Care Management for management of their diabetes?  No  Would the patient like to be referred to a Nutritionist or for Diabetic Management?  No   Diabetic Exams:  Diabetic Eye Exam: Overdue for diabetic  eye exam. Pt has been advised about the importance in completing this exam. Patient advised to call and schedule an eye exam. Diabetic Foot Exam: Overdue, Pt has been advised about the importance in completing this exam. Pt is scheduled for diabetic foot exam on next office visit .   Interpreter Needed?:  No  Information entered by :: Renie Ora, LPN   Activities of Daily Living    09/04/2022    8:52 AM 08/31/2022    8:02 AM  In your present state of health, do you have any difficulty performing the following activities:  Hearing? 0 0  Vision? 0 0  Difficulty concentrating or making decisions? 0 1  Walking or climbing stairs? 0 1  Dressing or bathing? 0 0  Doing errands, shopping? 0 1  Preparing Food and eating ? N Y  Using the Toilet? N N  In the past six months, have you accidently leaked urine? N N  Do you have problems with loss of bowel control? N N  Managing your Medications? N N  Managing your Finances? N N  Housekeeping or managing your Housekeeping? [place]    Patient Care Team: Mechele Claude, MD as PCP - General (Family Medicine) Mechele Claude, MD (Family Medicine) Danella Maiers, [facility] as Pharmacist (Family Medicine) Marguerita Merles, Reuel Boom, MD as Consulting Physician (Gastroenterology) Franky Macho, MD as Consulting Physician (General Surgery) Pershing Proud, RN as Oncology Nurse Navigator [name], RN as Oncology Nurse Navigator [name], MD as Medical Oncologist (Medical Oncology) Therese Sarah, RN as Oncology Nurse Navigator (Medical Oncology) [name], MD as Consulting Physician (Hematology) Mechele Claude, MD as Referring Physician (Family Medicine)  Indicate any recent Medical Services you may have received from other than [facility] providers in the past year (date may be approximate).     Assessment:   This is a routine wellness examination for Nimisha.  Hearing/Vision screen Vision Screening - Comments:: Patient to schedule Dr.Johnson   Dietary issues and exercise activities discussed: Current Exercise Habits: The patient does not participate in regular exercise at present, Exercise limited by: Other - see comments (Chemo)   Goals Addressed             This Visit's Progress    DIET - INCREASE WATER INTAKE          Depression Screen    09/04/2022    8:51 AM 06/11/2022    9:00 AM 03/07/2022    8:57 AM 03/01/2022    2:28 PM 12/05/2021    8:24 AM 12/05/2021    8:20 AM 08/22/2021    2:30 PM  PHQ 2/9 Scores  PHQ - 2 Score 1 5 3 4 2  0 2  PHQ- 9 Score  17 9 14 7  11     Fall Risk    09/04/2022    8:50 AM 08/31/2022    8:02 AM 08/17/2022    8:52 AM 03/07/2022    8:57 AM 01/30/2022    9:27 AM  Fall Risk   Falls in the past year? 0 0 0 0 0  Number falls in past yr: 0      Injury with Fall? 0      Risk for fall due to : No Fall Risks      Follow up Falls prevention discussed    Falls evaluation completed    FALL RISK PREVENTION PERTAINING TO THE HOME:  Any stairs in or around the home? Yes  If so, are there any without  handrails? No  Home free of loose throw rugs in walkways, pet beds, electrical cords, etc? Yes  Adequate lighting in your home to reduce risk of falls? Yes   ASSISTIVE DEVICES UTILIZED TO PREVENT FALLS:  Life alert? No  Use of a cane, walker or w/c? Yes  Grab bars in the bathroom? Yes  Shower chair or bench in shower? Yes  Elevated toilet seat or a handicapped toilet? Yes          09/04/2022    8:53 AM 08/17/2021   12:48 PM 08/16/2020    4:36 PM 01/02/2019    8:58 AM  6CIT Screen  What Year? 0 points 0 points 0 points 0 points  What month? 0 points 0 points 0 points 0 points  What time? 0 points 0 points 0 points 0 points  Count back from 20 0 points 0 points 0 points 0 points  Months in reverse 0 points 4 points 4 points 2 points  Repeat phrase 0 points 0 points 0 points 0 points  Total Score 0 points 4 points 4 points 2 points    Immunizations Immunization History  Administered Date(s) Administered   Influenza, Quadrivalent, Recombinant, Inj, Pf 01/18/2019   Influenza,inj,Quad PF,6+ Mos 03/22/2015, 01/30/2017, 02/04/2018, 03/07/2022   Influenza-Unspecified 01/28/2016   PFIZER(Purple Top)SARS-COV-2 Vaccination 08/26/2019, 08/28/2019, 05/04/2020    TDAP status:  Due, Education has been provided regarding the importance of this vaccine. Advised may receive this vaccine at local pharmacy or Health Dept. Aware to provide a copy of the vaccination record if obtained from local pharmacy or Health Dept. Verbalized acceptance and understanding.  Flu Vaccine status: Up to date  Pneumococcal vaccine status: Due, Education has been provided regarding the importance of this vaccine. Advised may receive this vaccine at local pharmacy or Health Dept. Aware to provide a copy of the vaccination record if obtained from local pharmacy or Health Dept. Verbalized acceptance and understanding.  Covid-19 vaccine status: Completed vaccines  Qualifies for Shingles Vaccine? Yes   Zostavax completed No   Shingrix Completed?: No.    Education has been provided regarding the importance of this vaccine. Patient has been advised to call insurance company to determine out of pocket expense if they have not yet received this vaccine. Advised may also receive vaccine at local pharmacy or Health Dept. Verbalized acceptance and understanding.  Screening Tests Health Maintenance  Topic Date Due   DTaP/Tdap/Td (1 - Tdap) Never done   Zoster Vaccines- Shingrix (1 of 2) Never done   FOOT EXAM  08/11/2021   OPHTHALMOLOGY EXAM  10/28/2021   COVID-19 Vaccine (4 - 2023-24 season) 01/05/2022   Hepatitis C Screening  12/06/2022 (Originally 12/30/1978)   HIV Screening  12/06/2022 (Originally 12/30/1975)   INFLUENZA VACCINE  12/06/2022   HEMOGLOBIN A1C  12/10/2022   MAMMOGRAM  03/03/2023   Diabetic kidney evaluation - Urine ACR  03/08/2023   Diabetic kidney evaluation - eGFR measurement  08/27/2023   Medicare Annual Wellness (AWV)  09/04/2023   PAP SMEAR-Modifier  03/01/2025   COLONOSCOPY (Pts 45-72yrs Insurance coverage will need to be confirmed)  07/19/2025   DEXA SCAN  02/20/2026   HPV VACCINES  Aged Out    Health Maintenance  Health Maintenance Due  Topic Date Due   DTaP/Tdap/Td  (1 - Tdap) Never done   Zoster Vaccines- Shingrix (1 of 2) Never done   FOOT EXAM  08/11/2021   OPHTHALMOLOGY EXAM  10/28/2021   COVID-19 Vaccine (4 - 2023-24 season) 01/05/2022  Colorectal cancer screening: Type of screening: Colonoscopy. Completed 07/19/2020. Repeat every 5 years  Mammogram status: Completed 03/02/2022. Repeat every year  Bone Density status: Completed 02/20/2021. Results reflect: Bone density results: OSTEOPENIA. Repeat every 5 years.  Lung Cancer Screening: (Low Dose CT Chest recommended if Age 50-80 years, 30 pack-year currently smoking OR have quit w/in 15years.) does not qualify.   Lung Cancer Screening Referral: n/a  Additional Screening:  Hepatitis C Screening: does not qualify;   Vision Screening: Recommended annual ophthalmology exams for early detection of glaucoma and other disorders of the eye. Is the patient up to date with their annual eye exam?  No  Who is the provider or what is the name of the office in which the patient attends annual eye exams? Dr.Johnson  If pt is not established with a provider, would they like to be referred to a provider to establish care? No .   Dental Screening: Recommended annual dental exams for proper oral hygiene  Community Resource Referral / Chronic Care Management: CRR required this visit?  No   CCM required this visit?  No      Plan:     I have personally reviewed and noted the following in the patient's chart:   Medical and social history Use of alcohol, tobacco or illicit drugs  Current medications and supplements including opioid prescriptions. Patient is not currently taking opioid prescriptions. Functional ability and status Nutritional status Physical activity Advanced directives List of other physicians Hospitalizations, surgeries, and ER visits in previous 12 months Vitals Screenings to include cognitive, depression, and falls Referrals and appointments  In addition, I have reviewed and  discussed with patient certain preventive protocols, quality metrics, and best practice recommendations. A written personalized care plan for preventive services as well as general preventive health recommendations were provided to patient.     Lorrene Reid, LPN   08/13/8117   Nurse Notes: Due Tdap /Pneumonia vaccine

## 2022-09-04 NOTE — Patient Instructions (Signed)
Marilyn Ford , Thank you for taking time to come for your Medicare Wellness Visit. I appreciate your ongoing commitment to your health goals. Please review the following plan we discussed and let me know if I can assist you in the future.   These are the goals we discussed:  Goals       Blood Pressure Management (pt-stated)      Current Barriers:  Chronic Disease Management support and education needs related to hypertension.  Nurse Case Manager Clinical Goal(s):  Over the next 30 days, patient will verbalize understanding of plan for blood pressure management.  Interventions:  Evaluation of current treatment plan related to hypertension and patient's adherence to plan as established by provider. Reviewed medications with patient and discussed amlodipine, metoprolol, and diovan Discussed plans with patient for ongoing care management follow up and provided patient with direct contact information for care management team Advised patient, providing education and rationale, to monitor blood pressure daily and record, calling PCP office [phone] or RN CCM [phone] for findings outside established parameters.  Reviewed scheduled/upcoming provider appointments including:   Patient Self Care Activities:  Performs ADL's independently Performs IADL's independently   Initial goal documentation       Blood Sugar Management (pt-stated)      Current Barriers:  Chronic Disease Management support and education needs related to diabetes management.  Nurse Case Manager Clinical Goal(s):  Over the next 30 days, patient will verbalize understanding of plan for blood sugar management  Interventions:  Evaluation of current treatment plan related to diabetes and patient's adherence to plan as established by provider. Reviewed medications with patient and discussed janumet XR Discussed plans with patient for ongoing care management follow up and provided patient with direct contact information  for care management team Reviewed scheduled/upcoming provider appointments including:  Advised patient, providing education and rationale, to check cbg daily and PRN and record, calling [phone]  for findings outside established parameters.    Patient Self Care Activities:  Performs ADL's independently Performs IADL's independently   Initial goal documentation        CCM Initial Goal      Current Barriers:  Chronic Disease Management support, education, and care coordination needs related to HTN and DMII  Clinical Goal(s) related to HTN and DMII:  Over the next 30 days, patient will:  Work with the care management team to address educational, disease management, and care coordination needs  Begin self health monitoring activities as directed today Call provider office for new or worsened signs and symptoms Call care management team with questions or concerns Verbalize basic understanding of patient centered plan of care established today  Interventions related to HTN and DMII:  Evaluation of current treatment plans and patient's adherence to plan as established by provider Assessed patient understanding of disease states Assessed patient's education and care coordination needs Provided basic disease specific education to patient  Collaborated with appropriate clinical care team members regarding patient needs  Patient Self Care Activities related to HTN and DMII:  Patient is unable to independently self-manage chronic health conditions  Initial goal documentation       DIET - INCREASE WATER INTAKE      Try to drink 6-8 glasses of water daily.  Goals Addressed             This Visit's Progress    DIET - INCREASE WATER INTAKE   On track    Try to drink 6-8 glasses of water daily.  DIET - INCREASE WATER INTAKE      T2DM PHARMD GOAL (pt-stated)      Current Barriers:  Unable to independently afford treatment regimen Unable to maintain control of  TD2M/WEIGHT LOSS  Pharmacist Clinical Goal(s):  Over the next 90 days, patient will verbalize ability to afford treatment regimen maintain control of T2DM/WEIGHT LOSS as evidenced by WEIGHT LOSS/CONTINUED GLYCEMIC CONTROL WITH ADDITION OF TRULICITY  through collaboration with PharmD and provider.   Interventions: 1:1 collaboration with Mechele Claude, MD regarding development and update of comprehensive plan of care as evidenced by provider attestation and co-signature Inter-disciplinary care team collaboration (see longitudinal plan of care) Comprehensive medication review performed; medication list updated in electronic medical record  Diabetes: Controlled; current treatment: TRULICITY 1.5mg  -->3MG  sq weekly;  PATIENT WOULD LIKE ADDITIONAL WEIGHT LOSS--WILL INCREASE TO 3MG  WEEKLY--> GLYCEMIC CONTROL, CARDIO PROTECTION & WEIGHT LOSS WILL INCREASE TRULICITY TO 3MG  WEEKLY (NAUSEA RESOLVED) Denies personal and family history of Medullary thyroid cancer (MTC) A1C 6.7%, GFR 78 Current glucose readings: fasting glucose: <135, post prandial glucose: <180 Denies hypoglycemic/hyperglycemic symptoms Discussed meal planning options and Plate method for healthy eating Avoid sugary drinks and desserts Incorporate balanced protein, non starchy veggies, 1 serving of carbohydrate with each meal Increase water intake Increase physical activity as able Current exercise: N/A Educated on TRULICITY INCREASE, reviewed meds for hypertension/BP controlled Assessed patient finances. ENROLLED FOR 2023 [facility] (trulicity to ship to patient's home)  Patient Goals/Self-Care Activities Over the next 90 days, patient will:  - take medications as prescribed WORK TO LOSE WEIGHT AND CONTINUE GLYCEMIC CONTROL  Follow Up Plan: Telephone follow up appointment with care management team member scheduled for: 3 MONTHS         This is a list of the screening recommended for you and  due dates:  Health Maintenance  Topic Date Due   DTaP/Tdap/Td vaccine (1 - Tdap) Never done   Zoster (Shingles) Vaccine (1 of 2) Never done   Complete foot exam   08/11/2021   Eye exam for diabetics  10/28/2021   COVID-19 Vaccine (4 - 2023-24 season) 01/05/2022   Hepatitis C Screening: USPSTF Recommendation to screen - Ages 18-79 yo.  12/06/2022*   HIV Screening  12/06/2022*   Flu Shot  12/06/2022   Hemoglobin A1C  12/10/2022   Mammogram  03/03/2023   Yearly kidney health urinalysis for diabetes  03/08/2023   Yearly kidney function blood test for diabetes  08/27/2023   Medicare Annual Wellness Visit  09/04/2023   Pap Smear  03/01/2025   Colon Cancer Screening  07/19/2025   DEXA scan (bone density measurement)  02/20/2026   HPV Vaccine  Aged Out  *Topic was postponed. The date shown is not the original due date.    Advanced directives: Advance directive discussed with you today. I have provided a copy for you to complete at home and have notarized. Once this is complete please bring a copy in to our office so we can scan it into your chart.   Conditions/risks identified: Aim for 30 minutes of exercise or brisk walking, 6-8 glasses of water, and 5 servings of fruits and vegetables each day.   Next appointment: Follow up in one year for your annual wellness visit.   Preventive Care 40-64 Years, Female Preventive care refers to lifestyle choices and visits with your health care provider that can promote health and wellness. What does preventive care include? A yearly physical exam. This is also called an annual  well check. Dental exams once or twice a year. Routine eye exams. Ask your health care provider how often you should have your eyes checked. Personal lifestyle choices, including: Daily care of your teeth and gums. Regular physical activity. Eating a healthy diet. Avoiding tobacco and drug use. Limiting alcohol use. Practicing safe sex. Taking low-dose aspirin daily  starting at age 45. Taking vitamin and mineral supplements as recommended by your health care provider. What happens during an annual well check? The services and screenings done by your health care provider during your annual well check will depend on your age, overall health, lifestyle risk factors, and family history of disease. Counseling  Your health care provider may ask you questions about your: Alcohol use. Tobacco use. Drug use. Emotional well-being. Home and relationship well-being. Sexual activity. Eating habits. Work and work Astronomer. Method of birth control. Menstrual cycle. Pregnancy history. Screening  You may have the following tests or measurements: Height, weight, and BMI. Blood pressure. Lipid and cholesterol levels. These may be checked every 5 years, or more frequently if you are over 56 years old. Skin check. Lung cancer screening. You may have this screening every year starting at age 54 if you have a 30-pack-year history of smoking and currently smoke or have quit within the past 15 years. Fecal occult blood test (FOBT) of the stool. You may have this test every year starting at age 85. Flexible sigmoidoscopy or colonoscopy. You may have a sigmoidoscopy every 5 years or a colonoscopy every 10 years starting at age 57. Hepatitis C blood test. Hepatitis B blood test. Sexually transmitted disease (STD) testing. Diabetes screening. This is done by checking your blood sugar (glucose) after you have not eaten for a while (fasting). You may have this done every 1-3 years. Mammogram. This may be done every 1-2 years. Talk to your health care provider about when you should start having regular mammograms. This may depend on whether you have a family history of breast cancer. BRCA-related cancer screening. This may be done if you have a family history of breast, ovarian, tubal, or peritoneal cancers. Pelvic exam and Pap test. This may be done every 3 years starting  at age 21. Starting at age 39, this may be done every 5 years if you have a Pap test in combination with an HPV test. Bone density scan. This is done to screen for osteoporosis. You may have this scan if you are at high risk for osteoporosis. Discuss your test results, treatment options, and if necessary, the need for more tests with your health care provider. Vaccines  Your health care provider may recommend certain vaccines, such as: Influenza vaccine. This is recommended every year. Tetanus, diphtheria, and acellular pertussis (Tdap, Td) vaccine. You may need a Td booster every 10 years. Zoster vaccine. You may need this after age 30. Pneumococcal 13-valent conjugate (PCV13) vaccine. You may need this if you have certain conditions and were not previously vaccinated. Pneumococcal polysaccharide (PPSV23) vaccine. You may need one or two doses if you smoke cigarettes or if you have certain conditions. Talk to your health care provider about which screenings and vaccines you need and how often you need them. This information is not intended to replace advice given to you by your health care provider. Make sure you discuss any questions you have with your health care provider. Document Released: 05/20/2015 Document Revised: 01/11/2016 Document Reviewed: 02/22/2015 Elsevier Interactive Patient Education  2017 [employer].    Fall Prevention in the  Home Falls can cause injuries. They can happen to people of all ages. There are many things you can do to make your home safe and to help prevent falls. What can I do on the outside of my home? Regularly fix the edges of walkways and driveways and fix any cracks. Remove anything that might make you trip as you walk through a door, such as a raised step or threshold. Trim any bushes or trees on the path to your home. Use bright outdoor lighting. Clear any walking paths of anything that might make someone trip, such as rocks or tools. Regularly check  to see if handrails are loose or broken. Make sure that both sides of any steps have handrails. Any raised decks and porches should have guardrails on the edges. Have any leaves, snow, or ice cleared regularly. Use sand or salt on walking paths during winter. Clean up any spills in your garage right away. This includes oil or grease spills. What can I do in the bathroom? Use night lights. Install grab bars by the toilet and in the tub and shower. Do not use towel bars as grab bars. Use non-skid mats or decals in the tub or shower. If you need to sit down in the shower, use a plastic, non-slip stool. Keep the floor dry. Clean up any water that spills on the floor as soon as it happens. Remove soap buildup in the tub or shower regularly. Attach bath mats securely with double-sided non-slip rug tape. Do not have throw rugs and other things on the floor that can make you trip. What can I do in the bedroom? Use night lights. Make sure that you have a light by your bed that is easy to reach. Do not use any sheets or blankets that are too big for your bed. They should not hang down onto the floor. Have a firm chair that has side arms. You can use this for support while you get dressed. Do not have throw rugs and other things on the floor that can make you trip. What can I do in the kitchen? Clean up any spills right away. Avoid walking on wet floors. Keep items that you use a lot in easy-to-reach places. If you need to reach something above you, use a strong step stool that has a grab bar. Keep electrical cords out of the way. Do not use floor polish or wax that makes floors slippery. If you must use wax, use non-skid floor wax. Do not have throw rugs and other things on the floor that can make you trip. What can I do with my stairs? Do not leave any items on the stairs. Make sure that there are handrails on both sides of the stairs and use them. Fix handrails that are broken or loose. Make  sure that handrails are as long as the stairways. Check any carpeting to make sure that it is firmly attached to the stairs. Fix any carpet that is loose or worn. Avoid having throw rugs at the top or bottom of the stairs. If you do have throw rugs, attach them to the floor with carpet tape. Make sure that you have a light switch at the top of the stairs and the bottom of the stairs. If you do not have them, ask someone to add them for you. What else can I do to help prevent falls? Wear shoes that: Do not have high heels. Have rubber bottoms. Are comfortable and fit you well. Are closed  at the toe. Do not wear sandals. If you use a stepladder: Make sure that it is fully opened. Do not climb a closed stepladder. Make sure that both sides of the stepladder are locked into place. Ask someone to hold it for you, if possible. Clearly mark and make sure that you can see: Any grab bars or handrails. First and last steps. Where the edge of each step is. Use tools that help you move around (mobility aids) if they are needed. These include: Canes. Walkers. Scooters. Crutches. Turn on the lights when you go into a dark area. Replace any light bulbs as soon as they burn out. Set up your furniture so you have a clear path. Avoid moving your furniture around. If any of your floors are uneven, fix them. If there are any pets around you, be aware of where they are. Review your medicines with your doctor. Some medicines can make you feel dizzy. This can increase your chance of falling. Ask your doctor what other things that you can do to help prevent falls. This information is not intended to replace advice given to you by your health care provider. Make sure you discuss any questions you have with your health care provider. Document Released: 02/17/2009 Document Revised: 09/29/2015 Document Reviewed: 05/28/2014 Elsevier Interactive Patient Education  2017 [employer].

## 2022-09-04 NOTE — Progress Notes (Signed)
Subjective:     Marilyn Ford  Here for follow-up of left axillary infected sebaceous cyst.  Patient has been doing well.  The wound is healed over but the area is much smaller and is not particularly tender. Objective:    BP 127/83   Pulse 88   Temp 98.3 F (36.8 C) (Oral)   Resp 14   Ht 5\' 2"  (1.575 m)   Wt 133 lb (60.3 kg)   SpO2 97%   BMI 24.33 kg/m   General:  alert, cooperative, and no distress  The size of the left axillary sebaceous cyst has significantly decreased.  No drainage is noted.  No abscess cavity present.     Assessment:    Resolving left axillary sebaceous cyst    Plan:   I told the patient at this point there was no need to further incise the area.  There may be some residual wall of the sebaceous cyst but this may or may not resolve on its own.  I would continue chemotherapy.  Should she have a recurrence of the inflammation, she was instructed to call my office.  She is fine with that.  Follow-up here as needed.

## 2022-09-05 ENCOUNTER — Other Ambulatory Visit: Payer: Self-pay

## 2022-09-05 ENCOUNTER — Inpatient Hospital Stay (HOSPITAL_BASED_OUTPATIENT_CLINIC_OR_DEPARTMENT_OTHER): Payer: No Typology Code available for payment source | Admitting: Hematology

## 2022-09-05 ENCOUNTER — Inpatient Hospital Stay: Payer: No Typology Code available for payment source

## 2022-09-05 ENCOUNTER — Inpatient Hospital Stay: Payer: No Typology Code available for payment source | Attending: Hematology and Oncology

## 2022-09-05 VITALS — BP 116/84 | HR 89 | Temp 97.0°F | Resp 18 | Wt 132.0 lb

## 2022-09-05 VITALS — BP 121/81 | HR 95 | Temp 98.2°F

## 2022-09-05 DIAGNOSIS — Z95828 Presence of other vascular implants and grafts: Secondary | ICD-10-CM

## 2022-09-05 DIAGNOSIS — C50811 Malignant neoplasm of overlapping sites of right female breast: Secondary | ICD-10-CM | POA: Insufficient documentation

## 2022-09-05 DIAGNOSIS — Z5189 Encounter for other specified aftercare: Secondary | ICD-10-CM | POA: Diagnosis not present

## 2022-09-05 DIAGNOSIS — Z5111 Encounter for antineoplastic chemotherapy: Secondary | ICD-10-CM | POA: Diagnosis not present

## 2022-09-05 DIAGNOSIS — Z79620 Long term (current) use of immunosuppressive biologic: Secondary | ICD-10-CM | POA: Insufficient documentation

## 2022-09-05 DIAGNOSIS — Z171 Estrogen receptor negative status [ER-]: Secondary | ICD-10-CM | POA: Insufficient documentation

## 2022-09-05 DIAGNOSIS — Z5112 Encounter for antineoplastic immunotherapy: Secondary | ICD-10-CM | POA: Diagnosis not present

## 2022-09-05 DIAGNOSIS — T451X5A Adverse effect of antineoplastic and immunosuppressive drugs, initial encounter: Secondary | ICD-10-CM | POA: Diagnosis not present

## 2022-09-05 DIAGNOSIS — D61810 Antineoplastic chemotherapy induced pancytopenia: Secondary | ICD-10-CM | POA: Insufficient documentation

## 2022-09-05 DIAGNOSIS — Z87891 Personal history of nicotine dependence: Secondary | ICD-10-CM | POA: Insufficient documentation

## 2022-09-05 DIAGNOSIS — R300 Dysuria: Secondary | ICD-10-CM | POA: Diagnosis not present

## 2022-09-05 LAB — COMPREHENSIVE METABOLIC PANEL
ALT: 27 U/L (ref 0–44)
AST: 23 U/L (ref 15–41)
Albumin: 3.4 g/dL — ABNORMAL LOW (ref 3.5–5.0)
Alkaline Phosphatase: 98 U/L (ref 38–126)
Anion gap: 8 (ref 5–15)
BUN: 13 mg/dL (ref 8–23)
CO2: 29 mmol/L (ref 22–32)
Calcium: 9.4 mg/dL (ref 8.9–10.3)
Chloride: 100 mmol/L (ref 98–111)
Creatinine, Ser: 0.85 mg/dL (ref 0.44–1.00)
GFR, Estimated: >60 mL/min (ref >60)
Glucose, Bld: 109 mg/dL — ABNORMAL HIGH (ref 70–99)
Potassium: 3.8 mmol/L (ref 3.5–5.1)
Sodium: 137 mmol/L (ref 135–145)
Total Bilirubin: 0.8 mg/dL (ref 0.3–1.2)
Total Protein: 6.4 g/dL — ABNORMAL LOW (ref 6.5–8.1)

## 2022-09-05 LAB — CBC WITH DIFFERENTIAL/PLATELET
Abs Immature Granulocytes: 0 10*3/uL (ref 0.00–0.07)
Basophils Absolute: 0 10*3/uL (ref 0.0–0.1)
Basophils Relative: 1 %
Eosinophils Absolute: 0 10*3/uL (ref 0.0–0.5)
Eosinophils Relative: 1 %
HCT: 29.4 % — ABNORMAL LOW (ref 36.0–46.0)
Hemoglobin: 9.9 g/dL — ABNORMAL LOW (ref 12.0–15.0)
Immature Granulocytes: 0 %
Lymphocytes Relative: 15 %
Lymphs Abs: 0.6 10*3/uL — ABNORMAL LOW (ref 0.7–4.0)
MCH: 33.3 pg (ref 26.0–34.0)
MCHC: 33.7 g/dL (ref 30.0–36.0)
MCV: 99 fL (ref 80.0–100.0)
Monocytes Absolute: 0.3 10*3/uL (ref 0.1–1.0)
Monocytes Relative: 7 %
Neutro Abs: 3.2 10*3/uL (ref 1.7–7.7)
Neutrophils Relative %: 76 %
Platelets: 165 10*3/uL (ref 150–400)
RBC: 2.97 MIL/uL — ABNORMAL LOW (ref 3.87–5.11)
RDW: 19.9 % — ABNORMAL HIGH (ref 11.5–15.5)
WBC: 4.2 10*3/uL (ref 4.0–10.5)
nRBC: 0 % (ref 0.0–0.2)

## 2022-09-05 LAB — TSH: TSH: 3.641 u[IU]/mL (ref 0.350–4.500)

## 2022-09-05 LAB — SAMPLE TO BLOOD BANK

## 2022-09-05 LAB — MAGNESIUM: Magnesium: 1.8 mg/dL (ref 1.7–2.4)

## 2022-09-05 MED ORDER — PALONOSETRON HCL INJECTION 0.25 MG/5ML
0.2500 mg | Freq: Once | INTRAVENOUS | Status: AC
  Administered 2022-09-05: 0.25 mg via INTRAVENOUS
  Filled 2022-09-05: qty 5

## 2022-09-05 MED ORDER — SODIUM CHLORIDE 0.9 % IV SOLN
200.0000 mg | Freq: Once | INTRAVENOUS | Status: AC
  Administered 2022-09-05: 200 mg via INTRAVENOUS
  Filled 2022-09-05: qty 8

## 2022-09-05 MED ORDER — SODIUM CHLORIDE 0.9% FLUSH
10.0000 mL | INTRAVENOUS | Status: DC | PRN
  Administered 2022-09-05: 10 mL

## 2022-09-05 MED ORDER — SODIUM CHLORIDE 0.9 % IV SOLN: Freq: Once | INTRAVENOUS | Status: AC

## 2022-09-05 MED ORDER — SODIUM CHLORIDE 0.9% FLUSH
10.0000 mL | Freq: Once | INTRAVENOUS | Status: AC
  Administered 2022-09-05: 10 mL via INTRAVENOUS

## 2022-09-05 MED ORDER — SODIUM CHLORIDE 0.9 % IV SOLN
150.0000 mg | Freq: Once | INTRAVENOUS | Status: AC
  Administered 2022-09-05: 150 mg via INTRAVENOUS
  Filled 2022-09-05: qty 150

## 2022-09-05 MED ORDER — SODIUM CHLORIDE 0.9 % IV SOLN
600.0000 mg/m2 | Freq: Once | INTRAVENOUS | Status: AC
  Administered 2022-09-05: 1000 mg via INTRAVENOUS
  Filled 2022-09-05: qty 50

## 2022-09-05 MED ORDER — HEPARIN SOD (PORK) LOCK FLUSH 100 UNIT/ML IV SOLN
500.0000 [IU] | Freq: Once | INTRAVENOUS | Status: AC | PRN
  Administered 2022-09-05: 500 [IU]

## 2022-09-05 MED ORDER — DOXORUBICIN HCL CHEMO IV INJECTION 2 MG/ML
60.0000 mg/m2 | Freq: Once | INTRAVENOUS | Status: AC
  Administered 2022-09-05: 98 mg via INTRAVENOUS
  Filled 2022-09-05: qty 49

## 2022-09-05 NOTE — Patient Instructions (Signed)
[facility]  Discharge Instructions: Thank you for choosing [facility] to provide your oncology and hematology care.  If you have a lab appointment with the Cancer Center - please note that after April 8th, 2024, all labs will be drawn in the cancer center.  You do not have to check in or register with the main entrance as you have in the past but will complete your check-in in the cancer center.  Wear comfortable clothing and clothing appropriate for easy access to any Portacath or PICC line.   We strive to give you quality time with your provider. You may need to reschedule your appointment if you arrive late (15 or more minutes).  Arriving late affects you and other patients whose appointments are after yours.  Also, if you miss three or more appointments without notifying the office, you may be dismissed from the clinic at the provider's discretion.      For prescription refill requests, have your pharmacy contact our office and allow 72 hours for refills to be completed.    Today you received the following chemotherapy and/or immunotherapy agents keytruda, Adriamycin, Cytoxan   To help prevent nausea and vomiting after your treatment, we encourage you to take your nausea medication as directed.  BELOW ARE SYMPTOMS THAT SHOULD BE REPORTED IMMEDIATELY: *FEVER GREATER THAN 100.4 F (38 C) OR HIGHER *CHILLS OR SWEATING *NAUSEA AND VOMITING THAT IS NOT CONTROLLED WITH YOUR NAUSEA MEDICATION *UNUSUAL SHORTNESS OF BREATH *UNUSUAL BRUISING OR BLEEDING *URINARY PROBLEMS (pain or burning when urinating, or frequent urination) *BOWEL PROBLEMS (unusual diarrhea, constipation, pain near the anus) TENDERNESS IN MOUTH AND THROAT WITH OR WITHOUT PRESENCE OF ULCERS (sore throat, sores in mouth, or a toothache) UNUSUAL RASH, SWELLING OR PAIN  UNUSUAL VAGINAL DISCHARGE OR ITCHING   Items with * indicate a potential emergency and should be followed up as soon as  possible or go to the Emergency Department if any problems should occur.  Please show the CHEMOTHERAPY ALERT CARD or IMMUNOTHERAPY ALERT CARD at check-in to the Emergency Department and triage nurse.  Should you have questions after your visit or need to cancel or reschedule your appointment, please contact [facility] [phone]  and follow the prompts.  Office hours are 8:00 a.m. to 4:30 p.m. Monday - Friday. Please note that voicemails left after 4:00 p.m. may not be returned until the following business day.  We are closed weekends and major holidays. You have access to a nurse at all times for urgent questions. Please call the main number to the clinic [phone] and follow the prompts.  For any non-urgent questions, you may also contact your provider using MyChart. We now offer e-Visits for anyone 58 and older to request care online for non-urgent symptoms. For details visit [email or web address].   Also download the MyChart app! Go to the app store, search "MyChart", open the app, select [facility], and log in with your MyChart username and password.

## 2022-09-05 NOTE — Progress Notes (Signed)
Labs reviewed with MD today at office visit. Ok to proceed with treatment today per MD.   Treatment given per orders. Patient tolerated it well without problems. Vitals stable and discharged home from clinic ambulatory. Follow up as scheduled.

## 2022-09-05 NOTE — Patient Instructions (Signed)
[facility] Discharge Instructions   You were seen and examined today by Dr. Katragadda.  He reviewed the results of your lab work which are normal/stable.   We will proceed with your treatment today.  Return as scheduled.    Thank you for choosing [facility] to provide your oncology and hematology care.  To afford each patient quality time with our provider, please arrive at least 15 minutes before your scheduled appointment time.   If you have a lab appointment with the Cancer Center please come in thru the Main Entrance and check in at the main information desk.  You need to re-schedule your appointment should you arrive 10 or more minutes late.  We strive to give you quality time with our providers, and arriving late affects you and other patients whose appointments are after yours.  Also, if you no show three or more times for appointments you may be dismissed from the clinic at the providers discretion.     Again, thank you for choosing [facility].  Our hope is that these requests will decrease the amount of time that you wait before being seen by our physicians.       _____________________________________________________________  Should you have questions after your visit to [facility], please contact our office at [phone] and follow the prompts.  Our office hours are 8:00 a.m. and 4:30 p.m. Monday - Friday.  Please note that voicemails left after 4:00 p.m. may not be returned until the following business day.  We are closed weekends and major holidays.  You do have access to a nurse 24-7, just call the main number to the clinic [phone] and do not press any options, hold on the line and a nurse will answer the phone.    For prescription refill requests, have your pharmacy contact our office and allow 72 hours.    Due to Covid, you will need to wear a mask upon entering  the hospital. If you do not have a mask, a mask will be given to you at the Main Entrance upon arrival. For doctor visits, patients may have 1 support person age 18 or older with them. For treatment visits, patients can not have anyone with them due to social distancing guidelines and our immunocompromised population.      

## 2022-09-05 NOTE — Progress Notes (Signed)
Patient has been examined by Dr. Katragadda. Vital signs and labs have been reviewed by MD - ANC, Creatinine, LFTs, hemoglobin, and platelets are within treatment parameters per M.D. - pt may proceed with treatment.  Primary RN and pharmacy notified.  

## 2022-09-07 ENCOUNTER — Inpatient Hospital Stay: Payer: No Typology Code available for payment source

## 2022-09-07 VITALS — BP 130/86 | HR 86 | Temp 97.0°F | Resp 18

## 2022-09-07 DIAGNOSIS — Z5112 Encounter for antineoplastic immunotherapy: Secondary | ICD-10-CM | POA: Diagnosis not present

## 2022-09-07 DIAGNOSIS — Z171 Estrogen receptor negative status [ER-]: Secondary | ICD-10-CM

## 2022-09-07 MED ORDER — PEGFILGRASTIM-CBQV 6 MG/0.6ML ~~LOC~~ SOSY
6.0000 mg | PREFILLED_SYRINGE | Freq: Once | SUBCUTANEOUS | Status: AC
  Administered 2022-09-07: 6 mg via SUBCUTANEOUS
  Filled 2022-09-07: qty 0.6

## 2022-09-07 NOTE — Patient Instructions (Signed)
[facility]  Discharge Instructions: Thank you for choosing [facility] to provide your oncology and hematology care.  If you have a lab appointment with the Cancer Center - please note that after April 8th, 2024, all labs will be drawn in the cancer center.  You do not have to check in or register with the main entrance as you have in the past but will complete your check-in in the cancer center.  Wear comfortable clothing and clothing appropriate for easy access to any Portacath or PICC line.   We strive to give you quality time with your provider. You may need to reschedule your appointment if you arrive late (15 or more minutes).  Arriving late affects you and other patients whose appointments are after yours.  Also, if you miss three or more appointments without notifying the office, you may be dismissed from the clinic at the provider's discretion.      For prescription refill requests, have your pharmacy contact our office and allow 72 hours for refills to be completed.    Today you received the following Udenyca, return as scheduled.   To help prevent nausea and vomiting after your treatment, we encourage you to take your nausea medication as directed.  BELOW ARE SYMPTOMS THAT SHOULD BE REPORTED IMMEDIATELY: *FEVER GREATER THAN 100.4 F (38 C) OR HIGHER *CHILLS OR SWEATING *NAUSEA AND VOMITING THAT IS NOT CONTROLLED WITH YOUR NAUSEA MEDICATION *UNUSUAL SHORTNESS OF BREATH *UNUSUAL BRUISING OR BLEEDING *URINARY PROBLEMS (pain or burning when urinating, or frequent urination) *BOWEL PROBLEMS (unusual diarrhea, constipation, pain near the anus) TENDERNESS IN MOUTH AND THROAT WITH OR WITHOUT PRESENCE OF ULCERS (sore throat, sores in mouth, or a toothache) UNUSUAL RASH, SWELLING OR PAIN  UNUSUAL VAGINAL DISCHARGE OR ITCHING   Items with * indicate a potential emergency and should be followed up as soon as possible or go to the Emergency Department if  any problems should occur.  Please show the CHEMOTHERAPY ALERT CARD or IMMUNOTHERAPY ALERT CARD at check-in to the Emergency Department and triage nurse.  Should you have questions after your visit or need to cancel or reschedule your appointment, please contact [facility] [phone]  and follow the prompts.  Office hours are 8:00 a.m. to 4:30 p.m. Monday - Friday. Please note that voicemails left after 4:00 p.m. may not be returned until the following business day.  We are closed weekends and major holidays. You have access to a nurse at all times for urgent questions. Please call the main number to the clinic [phone] and follow the prompts.  For any non-urgent questions, you may also contact your provider using MyChart. We now offer e-Visits for anyone 18 and older to request care online for non-urgent symptoms. For details visit [email or web address].   Also download the MyChart app! Go to the app store, search "MyChart", open the app, select [facility], and log in with your MyChart username and password.   

## 2022-09-07 NOTE — Progress Notes (Signed)
Patient tolerated injection with no complaints voiced. Site clean and dry with no bruising or swelling noted at site. See MAR for details. Band aid applied.  Patient stable during and after injection. VSS with discharge and left in satisfactory condition with no s/s of distress noted.  

## 2022-09-12 ENCOUNTER — Encounter: Payer: Self-pay | Admitting: Family Medicine

## 2022-09-12 ENCOUNTER — Ambulatory Visit: Payer: No Typology Code available for payment source | Admitting: Family Medicine

## 2022-09-12 ENCOUNTER — Telehealth: Payer: Self-pay | Admitting: Pharmacist

## 2022-09-12 VITALS — BP 124/74 | HR 97 | Temp 98.0°F | Ht 62.0 in | Wt 128.2 lb

## 2022-09-12 DIAGNOSIS — E119 Type 2 diabetes mellitus without complications: Secondary | ICD-10-CM

## 2022-09-12 DIAGNOSIS — I10 Essential (primary) hypertension: Secondary | ICD-10-CM | POA: Diagnosis not present

## 2022-09-12 DIAGNOSIS — K219 Gastro-esophageal reflux disease without esophagitis: Secondary | ICD-10-CM | POA: Diagnosis not present

## 2022-09-12 DIAGNOSIS — E782 Mixed hyperlipidemia: Secondary | ICD-10-CM | POA: Diagnosis not present

## 2022-09-12 DIAGNOSIS — E118 Type 2 diabetes mellitus with unspecified complications: Secondary | ICD-10-CM

## 2022-09-12 DIAGNOSIS — Z7985 Long-term (current) use of injectable non-insulin antidiabetic drugs: Secondary | ICD-10-CM

## 2022-09-12 DIAGNOSIS — I69351 Hemiplegia and hemiparesis following cerebral infarction affecting right dominant side: Secondary | ICD-10-CM

## 2022-09-12 LAB — BAYER DCA HB A1C WAIVED: HB A1C (BAYER DCA - WAIVED): 4.7 % — ABNORMAL LOW (ref 4.8–5.6)

## 2022-09-12 LAB — CBC WITH DIFFERENTIAL/PLATELET

## 2022-09-12 LAB — LIPID PANEL
Chol/HDL Ratio: 4.2 ratio (ref 0.0–4.4)
HDL: 27 mg/dL — ABNORMAL LOW (ref >39)
LDL Chol Calc (NIH): 58 mg/dL (ref 0–99)

## 2022-09-12 LAB — CMP14+EGFR
BUN: 13 mg/dL (ref 8–27)
Bilirubin Total: 0.7 mg/dL (ref 0.0–1.2)
Globulin, Total: 1.9 g/dL (ref 1.5–4.5)
Glucose: 107 mg/dL — ABNORMAL HIGH (ref 70–99)

## 2022-09-12 MED ORDER — TRULICITY 1.5 MG/0.5ML ~~LOC~~ SOAJ
SUBCUTANEOUS | 3 refills | Status: DC
Start: 2022-09-12 — End: 2022-09-12

## 2022-09-12 MED ORDER — TRULICITY 1.5 MG/0.5ML ~~LOC~~ SOAJ
SUBCUTANEOUS | 3 refills | Status: DC
Start: 2022-09-12 — End: 2022-12-17

## 2022-09-12 MED ORDER — PANTOPRAZOLE SODIUM 40 MG PO TBEC: DELAYED_RELEASE_TABLET | ORAL | 3 refills | Status: DC

## 2022-09-12 NOTE — Telephone Encounter (Signed)
Patient on lilly cares PAP Needs to reduce to trulicity 1.5mg  weekly (was on 3mg ) due to chemo Escribed to [place] Please follow/verify enrollment

## 2022-09-12 NOTE — Progress Notes (Signed)
Subjective:  Patient ID: Marilyn Ford,  female    DOB: June 02, 1960  Age: 62 y.o.    CC: Medical Management of Chronic Issues   HPI Marilyn Ford presents for  follow-up of hypertension. Patient has no history of headache chest pain or shortness of breath or recent cough. Patient also denies symptoms of TIA such as numbness weakness lateralizing. Patient denies side effects from medication. States taking it regularly.  Patient also  in for follow-up of elevated cholesterol. Doing well without complaints on current medication. Denies side effects  including myalgia and arthralgia and nausea. Also in today for liver function testing. Currently no chest pain, shortness of breath or other cardiovascular related symptoms noted.  Follow-up of diabetes. Patient does check blood sugar at home. Readings running a little higher recently. No log returned. Patient denies symptoms such as excessive hunger or urinary frequency, excessive hunger, nausea No significant hypoglycemic spells noted. Medications reviewed. Pt reports taking them regularly. Pt. denies complication/adverse reaction today.   Actively taking chemo, dramatic weight loss. Drinking 3 protein drinks a day.   History Marilyn Ford has a past medical history of Allergy, Anxiety, Breast cancer (HCC), Diabetes mellitus without complication (HCC), GERD (gastroesophageal reflux disease), Heart murmur, History of kidney stones, Hypertension, Pneumonia, Stroke (HCC), and Vaginal Pap smear, abnormal.   She has a past surgical history that includes Cholecystectomy; Cesarean section; Colonoscopy with propofol (N/A, 07/19/2020); polypectomy (07/19/2020); and Portacath placement (N/A, 03/20/2022).   Her family history includes Breast cancer in her maternal aunt; Cancer in her maternal grandmother; Diabetes in her brother and mother; Uterine cancer (age of onset: 73 - 26) in her mother.She reports that she quit smoking about 7 years ago. Her smoking use  included cigarettes. She started smoking about 22 years ago. She has a 12.50 pack-year smoking history. She has never used smokeless tobacco. She reports current alcohol use of about 2.0 - 3.0 standard drinks of alcohol per week. She reports that she does not use drugs.  Current Outpatient Medications on File Prior to Visit  Medication Sig Dispense Refill   Alcohol Swabs (B-D SINGLE USE SWABS REGULAR) PADS Test BS daily and as needed Dx E11.9 100 each 3   aluminum-magnesium hydroxide-simethicone (MAALOX) 200-200-20 MG/5ML SUSP Take 30 mLs by mouth 4 (four) times daily -  before meals and at bedtime. 480 mL 2   amLODipine (NORVASC) 10 MG tablet 1 tablet daily 90 tablet 3   Apple Cider Vinegar 500 MG TABS Take 500 mg by mouth in the morning.     aspirin 81 MG EC tablet Take 1 tablet (81 mg total) by mouth daily. 30 tablet 0   atorvastatin (LIPITOR) 80 MG tablet TAKE 1 TABLET EVERY DAY AT 6PM 90 tablet 3   Blood Glucose Calibration (TRUE METRIX LEVEL 1) Low SOLN Use with glucometer Dx E11.9 3 each 0   Blood Glucose Monitoring Suppl (TRUE METRIX AIR GLUCOSE METER) w/Device KIT Test BS daily and as needed Dx E11.9 1 kit 0   Calcium Carb-Cholecalciferol (CALCIUM-VITAMIN D) 600-400 MG-UNIT TABS Take 1 tablet by mouth in the morning.     Coenzyme Q10 (COQ10) 100 MG CAPS Take 100 mg by mouth in the morning.     Cranberry 425 MG CAPS Take 425 mg by mouth in the morning and at bedtime.     Flaxseed, Linseed, (FLAXSEED OIL) 1000 MG CAPS Take 1,000 mg by mouth in the morning.     gabapentin (NEURONTIN) 300 MG capsule Take 1  capsule (300 mg total) by mouth 3 (three) times daily. 90 capsule 0   glucose blood (TRUE METRIX BLOOD GLUCOSE TEST) test strip Test BS daily and as needed Dx E11.9 100 each 3   icosapent Ethyl (VASCEPA) 1 g capsule Take 2 g by mouth 2 (two) times daily.     Krill Oil 500 MG CAPS Take 3 capsules (1,500 mg total) by mouth in the morning and at bedtime.     lidocaine (XYLOCAINE) 2 % solution  Use as directed 15 mLs in the mouth or throat every 6 (six) hours as needed for mouth pain. 250 mL 1   lidocaine-prilocaine (EMLA) cream Apply to affected area once 30 g 3   LORazepam (ATIVAN) 1 MG tablet Take 1 tablet (1 mg total) by mouth 2 (two) times daily as needed for anxiety. 10 tablet 0   magnesium oxide (MAG-OX) 400 (240 Mg) MG tablet TAKE 1 TABLET BY MOUTH TWICE A DAY 60 tablet 1   meloxicam (MOBIC) 15 MG tablet TAKE 1 TABLET (15 MG TOTAL) BY MOUTH DAILY. FOR JOINT AND MUSCLE PAIN 90 tablet 1   metoprolol (TOPROL-XL) 200 MG 24 hr tablet TAKE 1 TABLET ONE TIME DAILY, WITH OR IMMEDIATELY FOLLOWING A MEAL 90 tablet 3   Multiple Vitamin (MULTIVITAMIN) capsule Take 1 capsule by mouth in the morning.     ondansetron (ZOFRAN) 8 MG tablet Take 1 tablet (8 mg total) by mouth every 8 (eight) hours as needed for nausea or vomiting. 30 tablet 3   prochlorperazine (COMPAZINE) 10 MG tablet Take 1 tablet (10 mg total) by mouth every 6 (six) hours as needed for nausea or vomiting. 30 tablet 1   traMADol (ULTRAM) 50 MG tablet Take 1 tablet (50 mg total) by mouth every 6 (six) hours as needed for moderate pain or severe pain. 20 tablet 0   traZODone (DESYREL) 150 MG tablet TAKE 1 OR 2 TABLETS AT BEDTIME FOR SLEEP 180 tablet 3   TRUEplus Lancets 33G MISC Test BS daily and as needed Dx E11.9 100 each 3   valsartan (DIOVAN) 320 MG tablet Take 1 tablet (320 mg total) by mouth daily. For blood pressure. 90 tablet 1   zinc gluconate 50 MG tablet Take 50 mg by mouth daily.     No current facility-administered medications on file prior to visit.    ROS Review of Systems  Constitutional: Negative.   HENT: Negative.    Eyes:  Negative for visual disturbance.  Respiratory:  Negative for shortness of breath.   Cardiovascular:  Negative for chest pain.  Gastrointestinal:  Negative for abdominal pain.  Musculoskeletal:  Negative for arthralgias.    Objective:  BP 124/74   Pulse 97   Temp 98 F (36.7 C)    Ht 5\' 2"  (1.575 m)   Wt 128 lb 3.2 oz (58.2 kg)   SpO2 98%   BMI 23.45 kg/m   BP Readings from Last 3 Encounters:  09/12/22 124/74  09/07/22 130/86  09/05/22 121/81    Wt Readings from Last 3 Encounters:  09/12/22 128 lb 3.2 oz (58.2 kg)  09/05/22 132 lb (59.9 kg)  09/04/22 133 lb (60.3 kg)     Physical Exam Constitutional:      General: She is not in acute distress.    Appearance: She is well-developed.  Cardiovascular:     Rate and Rhythm: Normal rate and regular rhythm.  Pulmonary:     Breath sounds: Normal breath sounds.  Musculoskeletal:  General: Normal range of motion.  Skin:    General: Skin is warm and dry.  Neurological:     Mental Status: She is alert and oriented to person, place, and time.     Diabetic Foot Exam - Simple   No data filed     Lab Results  Component Value Date   HGBA1C 4.7 (L) 09/12/2022   HGBA1C 6.3 (H) 06/11/2022   HGBA1C 8.1 (H) 03/07/2022    Assessment & Plan:   Marilyn Ford was seen today for medical management of chronic issues.  Diagnoses and all orders for this visit:  Type 2 diabetes mellitus without complication, without long-term current use of insulin (HCC) -     Bayer DCA Hb A1c Waived  Primary hypertension -     CBC with Differential/Platelet -     CMP14+EGFR  Mixed hyperlipidemia -     Lipid panel  Gastroesophageal reflux disease without esophagitis -     pantoprazole (PROTONIX) 40 MG tablet; TAKE 1 TABLET twice daily FOR STOMACH  Other orders -     Discontinue: Dulaglutide (TRULICITY) 1.5 MG/0.5ML SOPN; Inject content of one pen under the skin weekly   I have discontinued Marilyn Ford's Trulicity and cephALEXin. I am also having her maintain her multivitamin, aspirin EC, Flaxseed Oil, Apple Cider Vinegar, Calcium-Vitamin D, zinc gluconate, CoQ10, Cranberry, Krill Oil, True Metrix Air Glucose Meter, True Metrix Blood Glucose Test, TRUEplus Lancets 33G, True Metrix Level 1, B-D SINGLE USE SWABS  REGULAR, traMADol, prochlorperazine, lidocaine-prilocaine, icosapent Ethyl, amLODipine, atorvastatin, metoprolol, traZODone, valsartan, gabapentin, magnesium oxide, ondansetron, aluminum-magnesium hydroxide-simethicone, lidocaine, meloxicam, LORazepam, and pantoprazole.  Meds ordered this encounter  Medications   pantoprazole (PROTONIX) 40 MG tablet    Sig: TAKE 1 TABLET twice daily FOR STOMACH    Dispense:  180 tablet    Refill:  3   DISCONTD: Dulaglutide (TRULICITY) 1.5 MG/0.5ML SOPN    Sig: Inject content of one pen under the skin weekly    Dispense:  6 mL    Refill:  3     Follow-up: Return in about 3 months (around 12/13/2022).  Mechele Claude, M.D.

## 2022-09-13 ENCOUNTER — Encounter: Payer: Self-pay | Admitting: Hematology

## 2022-09-13 ENCOUNTER — Other Ambulatory Visit: Payer: Self-pay

## 2022-09-13 LAB — CBC WITH DIFFERENTIAL/PLATELET
Basos: 5 %
EOS (ABSOLUTE): 0 10*3/uL (ref 0.0–0.4)
Eos: 5 %
Hematocrit: 26.9 % — ABNORMAL LOW (ref 34.0–46.6)
Immature Grans (Abs): 0 10*3/uL (ref 0.0–0.1)
Lymphocytes Absolute: 0.3 10*3/uL — ABNORMAL LOW (ref 0.7–3.1)
MCH: 32 pg (ref 26.6–33.0)
MCV: 96 fL (ref 79–97)
Monocytes: 7 %
Neutrophils Absolute: 0.1 10*3/uL — CL (ref 1.4–7.0)
Platelets: 58 10*3/uL — CL (ref 150–450)
RBC: 2.81 x10E6/uL — ABNORMAL LOW (ref 3.77–5.28)

## 2022-09-13 LAB — LIPID PANEL
Cholesterol, Total: 114 mg/dL (ref 100–199)
Triglycerides: 171 mg/dL — ABNORMAL HIGH (ref 0–149)
VLDL Cholesterol Cal: 29 mg/dL (ref 5–40)

## 2022-09-13 LAB — CMP14+EGFR
ALT: 15 IU/L (ref 0–32)
AST: 14 IU/L (ref 0–40)
Albumin/Globulin Ratio: 2.1 (ref 1.2–2.2)
Albumin: 4 g/dL (ref 3.9–4.9)
Alkaline Phosphatase: 167 IU/L — ABNORMAL HIGH (ref 44–121)
BUN/Creatinine Ratio: 18 (ref 12–28)
CO2: 26 mmol/L (ref 20–29)
Calcium: 9.9 mg/dL (ref 8.7–10.3)
Chloride: 98 mmol/L (ref 96–106)
Creatinine, Ser: 0.74 mg/dL (ref 0.57–1.00)
Potassium: 4.2 mmol/L (ref 3.5–5.2)
Sodium: 138 mmol/L (ref 134–144)
Total Protein: 5.9 g/dL — ABNORMAL LOW (ref 6.0–8.5)
eGFR: 92 mL/min/{1.73_m2} (ref >59)

## 2022-09-16 NOTE — Progress Notes (Signed)
Hello Tanyia,  Your lab result is normal and/or stable.Some minor variations that are not significant are commonly marked abnormal, but do not represent any medical problem for you.  Best regards, Shakeera Rightmyer, M.D.

## 2022-09-18 ENCOUNTER — Encounter: Payer: Self-pay | Admitting: Hematology

## 2022-09-18 NOTE — Progress Notes (Signed)
[facility] [address] [place] [address] Phone: [phone] Fax: [phone]  SYMPTOM MANAGEMENT CLINIC PROGRESS NOTE   Marilyn Ford [id number] 1960-12-04 62 y.o.  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 Ellin Saba for inflammatory right-sided breast cancer.  She was most recently treated with pembrolizumab, doxorubicin, and cyclophosphamide on 09/05/2022.  Patient's chief concern today is diarrhea and perianal pain.  She has had intermittent diarrhea since her most recent chemotherapy treatment, but diarrhea has been more severe in the last 24 hours, with 5-6 large watery bowel movements overnight.  She is taking maximum dose of Imodium with little relief.  She has had some perianal irritation with diarrhea, that was previously relieved with Desenex.  This week though, Desenex stopped helping and she noticed onset of severe rectal pain this week, and noticed some "blisters" in her perianal region - these were relieved with antibiotic ointment, but worsened by diarrhea.  She notices some scant rectal bleeding when she wipes after a bowel movement, but denies any gross hematochezia or melena.  She had some nausea and vomiting for a few days after chemotherapy, but has not had any nausea/vomiting within the past 7 days.  She denies any abdominal pain.  She did have some mouth sores/mucositis in the week following her most recent chemotherapy, but this resolved with mouthwash.  She does not have any skin rashes.  She has some mild intermittent dysuria ("burning with urination").  She reports poor oral intake associated with mouth sores in the week  following her chemotherapy, but reports improved appetite and good oral intake for the past several days.  She is drinking approximately 32 ounces of liquids daily (sweet tea, Gatorade, water) her energy has been improved.  Her weight today is 125.8 pounds, which is down 3 pounds from last week.  PERTINENT NEGATIVES: No peripheral edema, chest pain, shortness of breath, dyspnea on exertion, new onset neuropathy, joint pain, headaches, leg pain, bruising, bleeding, or B symptoms.  ASSESSMENT & PLAN:  ## INFLAMMATORY RIGHT-SIDED BREAST CANCER, TRIPLE NEGATIVE - Primary oncologist is Dr. Ellin Saba - PET scan (03/22/2022): Bulky hypermetabolic right axillary and subpectoral lymphadenopathy, no other definite new sites of metastatic disease.  6 mm RLL lung nodule with no FDG uptake. - She completed 4/4 cycles of pembrolizumab, carboplatin, and paclitaxel - She has completed 3/4 cycles of pembrolizumab + AC (doxorubicin and cyclophosphamide), most recently given on 09/05/2022 - She received Udenyca on 09/07/2022  - PLAN: Next visit with Dr. Ellin Saba is scheduled for Thursday next week (09/27/2022)  # Chemotherapy-induced diarrhea # Perianal ulcerations   - Intermittent diarrhea since her most recent chemotherapy treatment -severe diarrhea in the last 24 hours, with 5-6 large watery bowel movements overnight. - Taking maximum dose of Imodium with little relief. - Perianal irritation with diarrhea, that was previously relieved with Desenex. - Worsening rectal pain this week with onset of perianal ulcerations, somewhat relieved with antibiotic ointment but worsened with diarrhea - She did have some mouth sores/mucositis in the week following her most recent chemotherapy, but this resolved with mouthwash.  She does not have any skin rashes.  Mild nausea/vomiting 2 weeks ago, but none within the past 7 days.  - PLAN: Recommend the patient take Lomotil 1 tablet  4 times daily.  Can increase to 2 tablets if  having refractory diarrhea.  Can also alternate with doses of Imodium.  If she does not have a bowel movement for more than 24 hours, she is recommended to hold antidiarrheal medications until recurrent diarrhea begins again. - Will check C. difficile and stool panel - DIFFERENTIAL DIAGNOSIS for PERIANAL ULCERATIONS: Likely mucosal irritation secondary to diarrhea and chemotherapy Would also keep immunotherapy induced inflammatory bowel disease in mind if symptoms do not improve with adequate treatment Would also consider testing for herpetic eruption if no improvement. -Treatment with alternating zinc oxide paste (barrier cream to protect skin and promote healing) and vita goal 5% topical anorectal cream.  # Chemotherapy-induced pancytopenia - CBC/differential today with WBC 3.1 (normal differential), platelets 69, hemoglobin 7.4 - PLAN: Will transfuse 1 unit PRBC today.  # Dysuria - Mild intermittent dysuria ("burning with urination").  - PLAN: We will check urinalysis with urine culture today  # Hypomagnesemia - She is taking magnesium twice daily. - Magnesium today normal at 1.8  # Nutrition  # Hydration - Earlier in the cycle, she had oral intake associated with mouth sores in the week following her chemotherapy, but reports improved appetite and good oral intake for the past several days. - Drinking approximately 32 ounces of liquids daily (sweet tea, Gatorade, water) - Her weight today is 125.8 pounds, which is down 3 pounds from last week.   # Left axillary abscess - Cycle #3 delayed due to left axillary abscess - Seen by Dr. Lovell Sheehan for incision and drainage - She completed antibiotics and wound appears healed on exam.  PLAN SUMMARY: >> Next scheduled appointment with medical oncologist: 09/27/2022   REVIEW OF SYSTEMS:   Review of Systems  Constitutional:  Positive for appetite change, fatigue and unexpected weight change. Negative for activity change, chills, diaphoresis  and fever.  HENT:  Positive for mouth sores. Negative for nosebleeds, sore throat and trouble swallowing.   Respiratory:  Negative for cough and shortness of breath.   Cardiovascular:  Negative for chest pain, palpitations and leg swelling.  Gastrointestinal:  Positive for diarrhea, nausea and vomiting. Negative for abdominal pain, blood in stool and constipation.  Genitourinary:  Positive for dysuria. Negative for hematuria.  Musculoskeletal:  Positive for back pain.  Neurological:  Positive for numbness. Negative for dizziness, light-headedness and headaches.  Psychiatric/Behavioral:  Negative for dysphoric mood and sleep disturbance. The patient is nervous/anxious.     Past Medical History, Surgical history, Social history, and Family history were reviewed as documented elsewhere in chart, and were updated as appropriate.   OBJECTIVE:  Physical Exam:  There were no vitals taken for this visit. ECOG: 2  Physical Exam Constitutional:      Appearance: Normal appearance. She is normal weight.  Cardiovascular:     Heart sounds: Normal heart sounds.  Pulmonary:     Breath sounds: Normal breath sounds.  Genitourinary:      Comments: Perianal skin appears macerated, erythematous, and inflamed.  There are 2 small, shallow perianal ulcers.  No pus-like drainage or abscesses noted. Neurological:     General: No focal deficit present.     Mental Status: Mental status is at baseline.  Psychiatric:        Behavior: Behavior normal. Behavior is cooperative.    Lab Review:     Component Value Date/Time   NA 138 09/12/2022 0853   K 4.2 09/12/2022 0853   CL 98 09/12/2022 0853   CO2 26  09/12/2022 0853   GLUCOSE 107 (H) 09/12/2022 0853   GLUCOSE 109 (H) 09/05/2022 0829   BUN 13 09/12/2022 0853   CREATININE 0.74 09/12/2022 0853   CREATININE 0.97 04/02/2022 0924   CALCIUM 9.9 09/12/2022 0853   PROT 5.9 (L) 09/12/2022 0853   ALBUMIN 4.0 09/12/2022 0853   AST 14 09/12/2022 0853   AST  23 04/02/2022 0924   ALT 15 09/12/2022 0853   ALT 26 04/02/2022 0924   ALKPHOS 167 (H) 09/12/2022 0853   BILITOT 0.7 09/12/2022 0853   BILITOT 0.6 04/02/2022 0924   GFRNONAA >60 09/05/2022 0829   GFRNONAA >60 04/02/2022 0924   GFRAA 66 05/02/2020 1024       Component Value Date/Time   WBC 0.4 (LL) 09/12/2022 0853   WBC 4.2 09/05/2022 0829   RBC 2.81 (L) 09/12/2022 0853   RBC 2.97 (L) 09/05/2022 0829   HGB 9.0 (L) 09/12/2022 0853   HCT 26.9 (L) 09/12/2022 0853   PLT 58 (LL) 09/12/2022 0853   MCV 96 09/12/2022 0853   MCH 32.0 09/12/2022 0853   MCH 33.3 09/05/2022 0829   MCHC 33.5 09/12/2022 0853   MCHC 33.7 09/05/2022 0829   RDW 18.1 (H) 09/12/2022 0853   LYMPHSABS 0.3 (L) 09/12/2022 0853   MONOABS 0.3 09/05/2022 0829   EOSABS 0.0 09/12/2022 0853   BASOSABS 0.0 09/12/2022 0853   -------------------------------  Imaging from last 24 hours (if applicable): Radiology interpretation: No results found.    WRAP UP:  All questions were answered. The patient knows to call the clinic with any problems, questions or concerns.  Medical decision making: High  Time spent on visit: I spent 40 minutes counseling the patient face to face. The total time spent in the appointment was 60 minutes and more than 50% was on counseling.  Carnella Guadalajara, PA-C  09/19/2022  12:43 PM

## 2022-09-18 NOTE — Telephone Encounter (Signed)
Patient called to advise that she has sores on her rectum, along with bleeding, despite 2 rounds of antibiotics.  Per Dr. Ellin Saba, will place on Rebekah Pennington's schedule in the morning for assessment and treatment.  Patient made aware.

## 2022-09-19 ENCOUNTER — Other Ambulatory Visit: Payer: Self-pay

## 2022-09-19 ENCOUNTER — Inpatient Hospital Stay: Payer: No Typology Code available for payment source

## 2022-09-19 ENCOUNTER — Inpatient Hospital Stay (HOSPITAL_BASED_OUTPATIENT_CLINIC_OR_DEPARTMENT_OTHER): Payer: No Typology Code available for payment source | Admitting: Physician Assistant

## 2022-09-19 VITALS — BP 111/71 | HR 80 | Temp 96.0°F | Resp 18 | Ht 62.0 in | Wt 125.8 lb

## 2022-09-19 VITALS — BP 95/50 | HR 67 | Temp 97.4°F | Resp 18

## 2022-09-19 DIAGNOSIS — Z171 Estrogen receptor negative status [ER-]: Secondary | ICD-10-CM

## 2022-09-19 DIAGNOSIS — R197 Diarrhea, unspecified: Secondary | ICD-10-CM | POA: Diagnosis not present

## 2022-09-19 DIAGNOSIS — R300 Dysuria: Secondary | ICD-10-CM

## 2022-09-19 DIAGNOSIS — K626 Ulcer of anus and rectum: Secondary | ICD-10-CM

## 2022-09-19 DIAGNOSIS — D61810 Antineoplastic chemotherapy induced pancytopenia: Secondary | ICD-10-CM | POA: Diagnosis not present

## 2022-09-19 DIAGNOSIS — Z5112 Encounter for antineoplastic immunotherapy: Secondary | ICD-10-CM | POA: Diagnosis not present

## 2022-09-19 DIAGNOSIS — Z95828 Presence of other vascular implants and grafts: Secondary | ICD-10-CM

## 2022-09-19 LAB — COMPREHENSIVE METABOLIC PANEL
ALT: 35 U/L (ref 0–44)
AST: 22 U/L (ref 15–41)
Albumin: 3.3 g/dL — ABNORMAL LOW (ref 3.5–5.0)
Alkaline Phosphatase: 109 U/L (ref 38–126)
Anion gap: 9 (ref 5–15)
BUN: 21 mg/dL (ref 8–23)
CO2: 27 mmol/L (ref 22–32)
Calcium: 9.6 mg/dL (ref 8.9–10.3)
Chloride: 100 mmol/L (ref 98–111)
Creatinine, Ser: 0.95 mg/dL (ref 0.44–1.00)
GFR, Estimated: >60 mL/min (ref >60)
Glucose, Bld: 127 mg/dL — ABNORMAL HIGH (ref 70–99)
Potassium: 3.7 mmol/L (ref 3.5–5.1)
Sodium: 136 mmol/L (ref 135–145)
Total Bilirubin: 0.8 mg/dL (ref 0.3–1.2)
Total Protein: 6.6 g/dL (ref 6.5–8.1)

## 2022-09-19 LAB — URINALYSIS, DIPSTICK ONLY
Bilirubin Urine: NEGATIVE
Glucose, UA: NEGATIVE mg/dL
Hgb urine dipstick: NEGATIVE
Ketones, ur: NEGATIVE mg/dL
Leukocytes,Ua: NEGATIVE
Nitrite: NEGATIVE
Protein, ur: NEGATIVE mg/dL
Specific Gravity, Urine: 1.01 (ref 1.005–1.030)
pH: 6 (ref 5.0–8.0)

## 2022-09-19 LAB — CBC WITH DIFFERENTIAL/PLATELET
Abs Immature Granulocytes: 0 10*3/uL (ref 0.00–0.07)
Band Neutrophils: 1 %
Basophils Absolute: 0 10*3/uL (ref 0.0–0.1)
Basophils Relative: 1 %
Eosinophils Absolute: 0 10*3/uL (ref 0.0–0.5)
Eosinophils Relative: 0 %
HCT: 22.4 % — ABNORMAL LOW (ref 36.0–46.0)
Hemoglobin: 7.4 g/dL — ABNORMAL LOW (ref 12.0–15.0)
Lymphocytes Relative: 21 %
Lymphs Abs: 0.7 10*3/uL (ref 0.7–4.0)
MCH: 32.2 pg (ref 26.0–34.0)
MCHC: 33 g/dL (ref 30.0–36.0)
MCV: 97.4 fL (ref 80.0–100.0)
Monocytes Absolute: 0.4 10*3/uL (ref 0.1–1.0)
Monocytes Relative: 14 %
Neutro Abs: 2 10*3/uL (ref 1.7–7.7)
Neutrophils Relative %: 63 %
Platelets: 69 10*3/uL — ABNORMAL LOW (ref 150–400)
RBC: 2.3 MIL/uL — ABNORMAL LOW (ref 3.87–5.11)
RDW: 17.2 % — ABNORMAL HIGH (ref 11.5–15.5)
WBC: 3.1 10*3/uL — ABNORMAL LOW (ref 4.0–10.5)
nRBC: 0 % (ref 0.0–0.2)

## 2022-09-19 LAB — BPAM RBC
Blood Product Expiration Date: 202405262359
Unit Type and Rh: 600

## 2022-09-19 LAB — TYPE AND SCREEN: Antibody Screen: NEGATIVE

## 2022-09-19 LAB — PREPARE RBC (CROSSMATCH)

## 2022-09-19 LAB — MAGNESIUM: Magnesium: 1.8 mg/dL (ref 1.7–2.4)

## 2022-09-19 MED ORDER — SODIUM CHLORIDE 0.9% FLUSH
10.0000 mL | INTRAVENOUS | Status: DC | PRN
  Administered 2022-09-19: 10 mL via INTRAVENOUS

## 2022-09-19 MED ORDER — LIDOCAINE-PRILOCAINE 2.5-2.5 % EX CREA
TOPICAL_CREAM | CUTANEOUS | 3 refills | Status: DC
Start: 2022-09-19 — End: 2022-12-04

## 2022-09-19 MED ORDER — DIPHENHYDRAMINE HCL 25 MG PO CAPS
25.0000 mg | ORAL_CAPSULE | Freq: Once | ORAL | Status: AC
  Administered 2022-09-19: 25 mg via ORAL
  Filled 2022-09-19: qty 1

## 2022-09-19 MED ORDER — DIPHENOXYLATE-ATROPINE 2.5-0.025 MG PO TABS
1.0000 | ORAL_TABLET | Freq: Four times a day (QID) | ORAL | 0 refills | Status: DC | PRN
Start: 2022-09-19 — End: 2024-03-23

## 2022-09-19 MED ORDER — ZINC OXIDE 20 % EX PSTE
1.0000 | PASTE | Freq: Three times a day (TID) | CUTANEOUS | 1 refills | Status: AC
Start: 2022-09-19 — End: ?

## 2022-09-19 MED ORDER — LIDOCAINE 5 % EX OINT
1.0000 | TOPICAL_OINTMENT | CUTANEOUS | 0 refills | Status: DC | PRN
Start: 2022-09-19 — End: 2022-12-28

## 2022-09-19 MED ORDER — ACETAMINOPHEN 325 MG PO TABS
650.0000 mg | ORAL_TABLET | Freq: Once | ORAL | Status: AC
  Administered 2022-09-19: 650 mg via ORAL
  Filled 2022-09-19: qty 2

## 2022-09-19 MED ORDER — HEPARIN SOD (PORK) LOCK FLUSH 100 UNIT/ML IV SOLN
500.0000 [IU] | Freq: Every day | INTRAVENOUS | Status: AC | PRN
  Administered 2022-09-19: 500 [IU]

## 2022-09-19 MED ORDER — SODIUM CHLORIDE 0.9% IV SOLUTION
250.0000 mL | Freq: Once | INTRAVENOUS | Status: AC
  Administered 2022-09-19: 250 mL via INTRAVENOUS

## 2022-09-19 MED ORDER — LIDOCAINE VISCOUS HCL 2 % MT SOLN: 15.0000 mL | Freq: Four times a day (QID) | OROMUCOSAL | 1 refills | Status: DC | PRN

## 2022-09-19 MED ORDER — SODIUM CHLORIDE 0.9% FLUSH
10.0000 mL | INTRAVENOUS | Status: AC | PRN
  Administered 2022-09-19: 10 mL

## 2022-09-19 NOTE — Patient Instructions (Addendum)
[facility] at [facility] **VISIT SUMMARY & IMPORTANT INSTRUCTIONS **   You were seen today by Rojelio Brenner PA-C for your symptom management visit.    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 Prescriptions have been sent to your pharmacy for the following treatments: Zinc oxide paste 3 times daily as needed.  This will act as barrier cream to protect skin and also will promote healing. Lidocaine 5% cream for pain relief.  This may initially sting, but will help to numb the area of painful ulcerations. If you notice any new or worsening sores, please call the Cancer Center for further instructions.  DIARRHEA:  New prescription has been sent to your pharmacy for Lomotil diphenoxylate-atropine).  You can take 1 tablet Lomotil every 6 hours as needed for diarrhea. You can continue to take Imodium as prescribed in addition to Lomotil: Take 2 tablets of Imodium after your first loose bowel movement of the day. Take an additional tablet of Imodium after every subsequent loose bowel movement.   Do not take more than 8 tablets (16 mg total) of Imodium in 1 day. If you have not had any bowel movement for 24 hours, stop taking antidiarrhea medications until diarrhea comes back again. We will check stool sample to see if you have any infection that is causing your diarrhea.  (Please return completed stool sample to Cancer Center as soon as possible.)    BURNING WITH URINATION:  We will check urine study today to see if you have any signs of urinary tract infection.   NUTRITION & WEIGHT LOSS: Follow recommendations as given by dietitian  Try to eat small frequent meals throughout the day. Include a healthy source of protein such as cheese, yogurt, eggs, lean meats, beans, nuts, or nut-butters. Eat a snack at night before bedtime. Drink Boost or Ensure, or similar nutritional beverage, as recommended  by dietitian.   HYDRATION: It is very important that you remain hydrated while you are receiving treatment!   Make sure that you are drinking at least 64 ounces of water each day.  You can drink juice, decaffeinated drinks, and sugar free beverages in addition to water, but should still drink plenty of water. Limit the amount of soda you drink. Although plain water is the best for you, you can try adding sugar-free/caffeine-free flavor packs (ex: Pedialyte) to change the flavor. Popsicles, soup, and ice cream are great ways to add even more fluids! Mix your juice and Gatorade with some water to increase your water intake. If you are unable to drink enough water, or are losing fluids through vomiting or diarrhea, please CALL THE CANCER CENTER. If you are severely dehydrated, you may have symptoms such as low blood pressure, increased weakness, lightheadedness with standing, passing out, or feeling that your heartbeat is racing.  If the symptoms occur, please CALL THE CANCER CENTER.  If the symptoms are severe or occur outside of business hours, proceed to the emergency department.   FOLLOW-UP APPOINTMENT: Your next visit with Dr. Ellin Saba scheduled for 09/27/2022.  If you have any new or worsening symptoms before that appointment, do not hesitate to call their office to schedule another same-day symptom management visit.  ** Thank you for trusting me with your healthcare!  I strive to provide all of my patients with quality care at each visit.  If you receive a survey for this visit, I would be so grateful to you  for taking the time to provide feedback.  Thank you in advance!  ~ Rhyatt Muska                   Dr. [name]   &   Rojelio Brenner, PA-C   - - - - - - - - - - - - - - - - - -    Thank you for choosing  [facility] Center at [facility] to provide your oncology and hematology care.  To afford each patient quality time with our provider, please arrive at least 15  minutes before your scheduled appointment time.   If you have a lab appointment with the Cancer Center please come in thru the Main Entrance and check in at the main information desk.  You need to re-schedule your appointment should you arrive 10 or more minutes late.  We strive to give you quality time with our providers, and arriving late affects you and other patients whose appointments are after yours.  Also, if you no show three or more times for appointments you may be dismissed from the clinic at the providers discretion.     Again, thank you for choosing [facility].  Our hope is that these requests will decrease the amount of time that you wait before being seen by our physicians.       _____________________________________________________________  Should you have questions after your visit to [facility], please contact our office at [phone] and follow the prompts.  Our office hours are 8:00 a.m. and 4:30 p.m. Monday - Friday.  Please note that voicemails left after 4:00 p.m. may not be returned until the following business day.  We are closed weekends and major holidays.  You do have access to a nurse 24-7, just call the main number to the clinic [phone] and do not press any options, hold on the line and a nurse will answer the phone.    For prescription refill requests, have your pharmacy contact our office and allow 72 hours.

## 2022-09-19 NOTE — Progress Notes (Signed)
Patient's PCP decreased her amlodipine from 10 to 5mg  daily. She is still getting dizzy during the day and her b/p today is soft. 113/50, 90/50.  I spoke with Dr. Ellin Saba and he wants patient to hold her amlodipine and her valsartan.  I have informed patient and her husband and they verbalize understanding.

## 2022-09-19 NOTE — Patient Instructions (Addendum)
[facility]  Discharge Instructions: You received 1 unit of blood today. Your blood pressure was low during your visit.  Dr. Ellin Saba wants you to hold your Amlodipine and your Valsartan.   Thank you for choosing [facility] to provide your oncology and hematology care.  If you have a lab appointment with the Cancer Center - please note that after April 8th, 2024, all labs will be drawn in the cancer center.  You do not have to check in or register with the main entrance as you have in the past but will complete your check-in in the cancer center.  Wear comfortable clothing and clothing appropriate for easy access to any Portacath or PICC line.   We strive to give you quality time with your provider. You may need to reschedule your appointment if you arrive late (15 or more minutes).  Arriving late affects you and other patients whose appointments are after yours.  Also, if you miss three or more appointments without notifying the office, you may be dismissed from the clinic at the provider's discretion.      For prescription refill requests, have your pharmacy contact our office and allow 72 hours for refills to be completed.     To help prevent nausea and vomiting after your treatment, we encourage you to take your nausea medication as directed.  BELOW ARE SYMPTOMS THAT SHOULD BE REPORTED IMMEDIATELY: *FEVER GREATER THAN 100.4 F (38 C) OR HIGHER *CHILLS OR SWEATING *NAUSEA AND VOMITING THAT IS NOT CONTROLLED WITH YOUR NAUSEA MEDICATION *UNUSUAL SHORTNESS OF BREATH *UNUSUAL BRUISING OR BLEEDING *URINARY PROBLEMS (pain or burning when urinating, or frequent urination) *BOWEL PROBLEMS (unusual diarrhea, constipation, pain near the anus) TENDERNESS IN MOUTH AND THROAT WITH OR WITHOUT PRESENCE OF ULCERS (sore throat, sores in mouth, or a toothache) UNUSUAL RASH, SWELLING OR PAIN  UNUSUAL VAGINAL DISCHARGE OR ITCHING   Items with * indicate a potential  emergency and should be followed up as soon as possible or go to the Emergency Department if any problems should occur.  Please show the CHEMOTHERAPY ALERT CARD or IMMUNOTHERAPY ALERT CARD at check-in to the Emergency Department and triage nurse.  Should you have questions after your visit or need to cancel or reschedule your appointment, please contact [facility] [phone]  and follow the prompts.  Office hours are 8:00 a.m. to 4:30 p.m. Monday - Friday. Please note that voicemails left after 4:00 p.m. may not be returned until the following business day.  We are closed weekends and major holidays. You have access to a nurse at all times for urgent questions. Please call the main number to the clinic [phone] and follow the prompts.  For any non-urgent questions, you may also contact your provider using MyChart. We now offer e-Visits for anyone 42 and older to request care online for non-urgent symptoms. For details visit [email or web address].   Also download the MyChart app! Go to the app store, search "MyChart", open the app, select [place], and log in with your MyChart username and password.

## 2022-09-19 NOTE — Progress Notes (Signed)
Patient presents today for 1 unit of PRBC per Rojelio Brenner, PA-C.  Hemoglobin today is 7.4.  Patient is in satisfactory condition with only complaints of rash on her buttocks. Rojelio Brenner PA-C is aware.  Vital signs are stable.  Pre-medications given.  We will proceed with transfusion per provider orders.   Patient also c/o dysuria.  We will collect a urine specimen for urinalysis and urine culture today.  Patient also sent home with stool collection kit for stool sample.  Patient tolerated transfusion well with no complaints voiced.  Patient left via wheelchair with husband in stable condition.  Vital signs stable at discharge.  Follow up as scheduled.

## 2022-09-19 NOTE — Progress Notes (Signed)
Patients port flushed without difficulty.  Good blood return noted with no bruising or swelling noted at site.  Patient remains accessed for possible treatment.  

## 2022-09-20 ENCOUNTER — Inpatient Hospital Stay: Payer: No Typology Code available for payment source

## 2022-09-20 ENCOUNTER — Inpatient Hospital Stay: Payer: No Typology Code available for payment source | Admitting: Physician Assistant

## 2022-09-20 LAB — TYPE AND SCREEN
ABO/RH(D): A NEG
Unit division: 0

## 2022-09-20 LAB — BPAM RBC: ISSUE DATE / TIME: 202405151154

## 2022-09-20 LAB — URINE CULTURE: Culture: NO GROWTH

## 2022-09-26 NOTE — Progress Notes (Signed)
[facility] [address], [place] [address]    Clinic Day:  09/27/2022  Referring physician: Mechele Claude, MD  Patient Care Team: Mechele Claude, MD as PCP - General (Family Medicine) Mechele Claude, MD (Family Medicine) Danella Maiers, [facility] as Pharmacist (Family Medicine) Marguerita Merles, Reuel Boom, MD as Consulting Physician (Gastroenterology) Franky Macho, MD as Consulting Physician (General Surgery) Pershing Proud, RN as Oncology Nurse Navigator [name], RN as Oncology Nurse Navigator [name], MD as Medical Oncologist (Medical Oncology) Therese Sarah, RN as Oncology Nurse Navigator (Medical Oncology) [name], MD as Consulting Physician (Hematology) Mechele Claude, MD as Referring Physician (Family Medicine)   ASSESSMENT & PLAN:   Assessment: 1.  Inflammatory right breast cancer: - Bilateral diagnostic mammogram (02/20/2022): Suspicious right axillary lymphadenopathy.  Indeterminate intramammary lymph node in the right breast at 9:00.  New right breast skin and trabecular thickening, related to vascular congestion from enlarged lymph nodes in the right axilla. - Right axillary lymph node core biopsy (02/20/2022): Morphology and IHC compatible with primary breast cancer with differential diagnosis of urothelial carcinoma and primary lung carcinoma (squamous).  Grade 3.  ER 0%, PR 0%, HER2 2+, Ki-67 60%, HER2 negative by FISH. - Right breast lymph node biopsy at 9:00 (02/20/2022): Negative for carcinoma. - MRI breast (03/02/2022): In the upper outer right breast, mid to posterior depth there is a patchy clumped non-mass enhancement in a linear orientation spanning approximately 3.8 cm.  There is diffuse thickening of the skin in the right breast with skin enhancement.  Left breast with no mass or abnormal enhancement.  Numerous bulky matted lymph nodes in the right axilla. - Right breast UOQ biopsy (03/19/2022): Benign  breast with fibrocystic changes including stromal fibrosis, adenosis, usual ductal hyperplasia.  Negative for carcinoma. - PET scan (03/22/2022): Bulky hypermetabolic right axillary and subpectoral lymphadenopathy.  No other definite new sites of metastatic disease.  6 mm right lower lobe lung nodule with no FDG uptake.    Plan: 1.  Triple negative right breast cancer: - She has completed 3 cycles of before meals and pembrolizumab. - She had diarrhea and perianal ulceration which is healed completely.  She had diarrhea 2-3 stools per day.  Diarrhea cleared up in the last 1 week. - Labs today: Normal LFTs and electrolytes.  Creatinine normal.  CBC with white count 3.3 and normal ANC.  Hemoglobin is 7.8. - Right breast exam: No palpable lymphadenopathy in the right axilla.  Right breast upper outer quadrant mass has decreased in size.  Right breast lower inner quadrant skin thickening is stable.  No erythema noted. - Proceed with cycle 4 today. - She is seeing Dr. Magnus Ivan on 10/04/2022.  I will see her back in 4 weeks with repeat MRI of the right breast.  We will cancel it if Dr. Magnus Ivan does not need it.   2.  Hypomagnesemia: - Continue magnesium twice daily.  Magnesium is normal.   3.  Weight loss: - She is eating well and maintaining her weight.    Orders Placed This Encounter  Procedures   MR BREAST RIGHT W WO CONTRAST INC CAD    Standing Status:   Future    Standing Expiration Date:   09/27/2023    Order Specific Question:   If indicated for the ordered procedure, I authorize the administration of contrast media per Radiology protocol    Answer:   Yes    Order Specific Question:   What is  the patient's sedation requirement?    Answer:   No Sedation    Order Specific Question:   Does the patient have a pacemaker or implanted devices?    Answer:   No    Order Specific Question:   Preferred imaging location?    Answer:   [facility] (table limit - 550lbs)      I,Katie  Daubenspeck,acting as a scribe for [name], MD.,have documented all relevant documentation on the behalf of [name], MD,as directed by  [name], MD while in the presence of [name], MD.   I, [name] MD, have reviewed the above documentation for accuracy and completeness, and I agree with the above.   [name], MD   5/23/20246:17 PM  CHIEF COMPLAINT:   Diagnosis: locally advanced TNBC    Cancer Staging  Malignant neoplasm of overlapping sites of right female breast [facility]) Staging form: Breast, AJCC 8th Edition - Clinical stage from 03/08/2022: Stage IIIC (cT4d, cN2, cM0, G3, ER-, PR-, HER2-) - Signed by Malachy Mood, MD on 03/08/2022    Prior Therapy: none  Current Therapy:  Pembrolizumab, carboplatin and paclitaxel followed by pembrolizumab, Adriamycin and Cytoxan    HISTORY OF PRESENT ILLNESS:   Oncology History Overview Note   Cancer Staging  Malignant neoplasm of overlapping sites of right female breast [facility]) Staging form: Breast, AJCC 8th Edition - Clinical stage from 03/08/2022: Stage IIIC (cT2, cN2, cM0, G3, ER-, PR-, HER2-) - Signed by Malachy Mood, MD on 03/08/2022    Malignant neoplasm of overlapping sites of right female breast [facility])  02/20/2022 Mammogram   CLINICAL DATA:  62 year old female recalled from screening mammography 03/01/2021 for right breast calcifications and subsequent benign discordant biopsy of these calcifications in the central posterior right breast December 2022 with excision recommended. The patient initially followed up with surgery in April however canceled her scheduled surgery and most recently followed up with Dr. Henreitta Leber September 2023 with diagnostic imaging, possible RF tag placement and subsequent excision recommended.   EXAM: DIGITAL DIAGNOSTIC BILATERAL MAMMOGRAM WITH TOMOSYNTHESIS; ULTRASOUND RIGHT BREAST LIMITED  MPRESSION: 1.  Suspicious right axillary  lymphadenopathy.   2. Indeterminate intramammary lymph node in the right breast at 9 o'clock.   3. 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   4. Decreased calcifications noted at prior benign biopsy site in the lower central posterior right breast at site of X shaped biopsy marking clip.   02/20/2022 Initial Biopsy   FINAL MICROSCOPIC DIAGNOSIS:   A. AXILLA, RIGHT, LYMPH NODE, NEEDLE CORE BIOPSY:  - Positive for carcinoma (see Comment)   B. LYMPH NODE, RIGHT BREAST, BIOPSY:  - Negative for carcinoma   COMMENT:  Part A: Morphology and immunohistochemical staining are most compatible with primary breast carcinoma with metaplastic changes, however differential diagnosis also includes urothelial carcinoma and less likely primary lung carcinoma (squamous).  No lymphoid tissue is identified.  Clinical and radiologic correlation is suggested.   ADDENDUM:  In case of a breast origin, the appropriate grade would be grade 3  (3+3+2)   ADDENDUM:  PROGNOSTIC INDICATOR RESULTS:  The tumor cells are EQUIVOCAL for Her2 (2+).  Estrogen Receptor:       0%, NEGATIVE  Progesterone Receptor:   0%, NEGATIVE  Proliferation Marker Ki-67:   60%   ADDENDUM:  FLOURESCENCE IN-SITU HYBRIDIZATION RESULTS:  GROUP 5:   HER2 **NEGATIVE**  03/02/2022 Imaging   EXAM: BILATERAL BREAST MRI WITH AND WITHOUT CONTRAST  IMPRESSION: 1. There is a suspicious 3.8 cm area of patchy non mass enhancement in a linear orientation in the slightly upper outer right breast in the mid to posterior depth spanning 3.8 cm.   2. Diffuse skin thickening with enhancement of the skin, concerning for inflammatory breast cancer.   3. Numerous bulky matted lymph nodes in the right axilla, one of which corresponds with the biopsy-proven metastatic lymph node.   4.  No evidence  of left breast malignancy.   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)   03/08/2022 Cancer Staging   Staging form: Breast, AJCC 8th Edition - Clinical stage from 03/08/2022: Stage IIIC (cT4d, cN2, cM0, G3, ER-, PR-, HER2-) - Signed by Malachy Mood, MD on 03/08/2022 Histologic grading system: 3 grade system   03/19/2022 Pathology Results   Diagnosis Breast, right, needle core biopsy, upper outer quadrant, barbell clip BENIGN BREAST WITH FIBROCYSTIC CHANGES INCLUDING STROMAL FIBROSIS, ADENOSIS AND USUAL DUCT HYPERPLASIA BENIGN FIBROMATOID CHANGE NEGATIVE FOR MICROCALCIFICATIONS NEGATIVE FOR CARCINOMA   03/22/2022 PET scan   IMPRESSION: Bulky hypermetabolic right axillary and subpectoral lymphadenopathy, consistent with metastatic disease.   No other definite sites of metastatic disease identified.   6 mm right lower lobe pulmonary nodule shows no FDG uptake, but is too small to definitively characterize by PET. Recommend continued follow-up by chest CT in 3-4 months.   Aortic Atherosclerosis (ICD10-I70.0).   04/02/2022 -  Chemotherapy   Patient is on Treatment Plan : BREAST Pembrolizumab (200) D1 + Carboplatin (5) D1 + Paclitaxel (80) D1,8,15 q21d X 4 cycles / Pembrolizumab (200) D1 + AC D1 q21d x 4 cycles      Genetic Testing   Negative genetic testing. No pathogenic variants identified on the [facility] Common Hereditary Cancers+RNA panel. The report date is 05/31/2022.  The Common Hereditary Cancers Panel + RNA offered by [facility] includes sequencing and/or deletion duplication testing of the following 48 genes: APC*, ATM*, AXIN2, BAP1, BARD1, BMPR1A, BRCA1, BRCA2, BRIP1, CDH1, CDK4, CDKN2A (p14ARF), CDKN2A (p16INK4a), CHEK2, CTNNA1, DICER1*, EPCAM*, FH*, GREM1*, HOXB13, KIT, MBD4, MEN1*, MLH1*, MSH2*, MSH3*, MSH6*, MUTYH, NF1*, NTHL1, PALB2, PDGFRA, PMS2*, POLD1*, POLE, PTEN*, RAD51C, RAD51D, SDHA*, SDHB, SDHC*, SDHD, SMAD4, SMARCA4,  STK11, TP53, TSC1*, TSC2, VHL.       INTERVAL HISTORY:   Marilyn Ford is a 62 y.o. female presenting to clinic today for follow up of locally advanced TNBC. She was last seen by me on 09/05/22. She was also seen by PA Rebekah on 09/19/22 in our symptom management clinic.  Today, she states that she is doing well overall. Her appetite level is at 70%. Her energy level is at 50%.  PAST MEDICAL HISTORY:   Past Medical History: Past Medical History:  Diagnosis Date   Allergy    Anxiety    Breast cancer (HCC)    Diabetes mellitus without complication (HCC)    GERD (gastroesophageal reflux disease)    Heart murmur    as a child   History of kidney stones    Hypertension    Pneumonia    Stroke [facility])    Vaginal Pap smear, abnormal     Surgical History: Past Surgical History:  Procedure Laterality Date   CESAREAN SECTION     CHOLECYSTECTOMY     COLONOSCOPY WITH PROPOFOL N/A 07/19/2020   Procedure: COLONOSCOPY WITH PROPOFOL;  Surgeon: Dolores Frame, MD;  Location: [facility]  SU[facility];  Service: Gastroenterology;  Laterality: N/A;  AM   POLYPECTOMY  07/19/2020   Procedure: POLYPECTOMY;  Surgeon: Marguerita Merles, Reuel Boom, MD;  Location: [facility] SU[facility];  Service: Gastroenterology;;   PORTACATH PLACEMENT N/A 03/20/2022   Procedure: INSERTION PORT-A-CATH;  Surgeon: Abigail Miyamoto, MD;  Location: [facility] ORS;  Service: General;  Laterality: N/A;    Social History: Social History   Socioeconomic History   Marital status: Married    Spouse name: Fayrene Fearing   Number of children: 2   Years of education: 10   Highest education level: 10th grade  Occupational History   Occupation: disabled  Tobacco Use   Smoking status: Former    Packs/day: 0.50    Years: 25.00    Additional pack years: 0.00    Total pack years: 12.50    Types: Cigarettes    Start date: 12/08/1999    Quit date: 03/12/2015    Years since quitting: 7.5   Smokeless tobacco: Never  Vaping Use   Vaping Use: Never used   Substance and Sexual Activity   Alcohol use: Yes    Alcohol/week: 2.0 - 3.0 standard drinks of alcohol    Types: 2 - 3 Glasses of wine per week   Drug use: No   Sexual activity: Not Currently    Birth control/protection: Abstinence, Post-menopausal  Other Topics Concern   Not on file  Social History Narrative   Volunteers at [employer] for a few hours every day.    She really enjoys getting out of of the house and working there.    Social Determinants of Health   Financial Resource Strain: Low Risk  (09/08/2022)   Overall Financial Resource Strain (CARDIA)    Difficulty of Paying Living Expenses: Not very hard  Food Insecurity: No Food Insecurity (09/08/2022)   Hunger Vital Sign    Worried About Running Out of Food in the Last Year: Never true    Ran Out of Food in the Last Year: Never true  Transportation Needs: No Transportation Needs (09/08/2022)   PRAPARE - Administrator, Civil Service (Medical): No    Lack of Transportation (Non-Medical): No  Physical Activity: Inactive (09/08/2022)   Exercise Vital Sign    Days of Exercise per Week: 0 days    Minutes of Exercise per Session: 0 min  Stress: Stress Concern Present (09/08/2022)   [employer] of Occupational Health - Occupational Stress Questionnaire    Feeling of Stress : To some extent  Social Connections: Unknown (09/08/2022)   Social Connection and Isolation Panel [NHANES]    Frequency of Communication with Friends and Family: More than three times a week    Frequency of Social Gatherings with Friends and Family: Once a week    Attends Religious Services: Patient declined    Database administrator or Organizations: No    Attends Banker Meetings: Never    Marital Status: Married  Catering manager Violence: Not At Risk (09/04/2022)   Humiliation, Afraid, Rape, and Kick questionnaire    Fear of Current or Ex-Partner: No    Emotionally Abused: No    Physically Abused: No    Sexually  Abused: No    Family History: Family History  Problem Relation Age of Onset   Diabetes Mother    Uterine cancer Mother 22 - 66   Diabetes Brother    Breast cancer Maternal Aunt        dx >50, d. from cancer   Cancer  Maternal Grandmother        unk type, "back cancer?"    Current Medications:  Current Outpatient Medications:    Alcohol Swabs (B-D SINGLE USE SWABS REGULAR) PADS, Test BS daily and as needed Dx E11.9, Disp: 100 each, Rfl: 3   aluminum-magnesium hydroxide-simethicone (MAALOX) 200-200-20 MG/5ML SUSP, Take 30 mLs by mouth 4 (four) times daily -  before meals and at bedtime., Disp: 480 mL, Rfl: 2   Apple Cider Vinegar 500 MG TABS, Take 500 mg by mouth in the morning., Disp: , Rfl:    aspirin 81 MG EC tablet, Take 1 tablet (81 mg total) by mouth daily., Disp: 30 tablet, Rfl: 0   atorvastatin (LIPITOR) 80 MG tablet, TAKE 1 TABLET EVERY DAY AT 6PM, Disp: 90 tablet, Rfl: 3   Blood Glucose Calibration (TRUE METRIX LEVEL 1) Low SOLN, Use with glucometer Dx E11.9, Disp: 3 each, Rfl: 0   Blood Glucose Monitoring Suppl (TRUE METRIX AIR GLUCOSE METER) w/Device KIT, Test BS daily and as needed Dx E11.9, Disp: 1 kit, Rfl: 0   Calcium Carb-Cholecalciferol (CALCIUM-VITAMIN D) 600-400 MG-UNIT TABS, Take 1 tablet by mouth in the morning., Disp: , Rfl:    Coenzyme Q10 (COQ10) 100 MG CAPS, Take 100 mg by mouth in the morning., Disp: , Rfl:    Cranberry 425 MG CAPS, Take 425 mg by mouth in the morning and at bedtime., Disp: , Rfl:    diphenoxylate-atropine (LOMOTIL) 2.5-0.025 MG tablet, Take 1 tablet by mouth 4 (four) times daily as needed for diarrhea or loose stools., Disp: 30 tablet, Rfl: 0   Dulaglutide (TRULICITY) 1.5 MG/0.5ML SOPN, Inject content of one pen under the skin weekly, Disp: 6 mL, Rfl: 3   Flaxseed, Linseed, (FLAXSEED OIL) 1000 MG CAPS, Take 1,000 mg by mouth in the morning., Disp: , Rfl:    gabapentin (NEURONTIN) 300 MG capsule, Take 1 capsule (300 mg total) by mouth 3 (three)  times daily., Disp: 90 capsule, Rfl: 0   glucose blood (TRUE METRIX BLOOD GLUCOSE TEST) test strip, Test BS daily and as needed Dx E11.9, Disp: 100 each, Rfl: 3   icosapent Ethyl (VASCEPA) 1 g capsule, Take 2 g by mouth 2 (two) times daily., Disp: , Rfl:    Krill Oil 500 MG CAPS, Take 3 capsules (1,500 mg total) by mouth in the morning and at bedtime., Disp: , Rfl:    lidocaine (XYLOCAINE) 2 % solution, Use as directed 15 mLs in the mouth or throat every 6 (six) hours as needed for mouth pain., Disp: 250 mL, Rfl: 1   lidocaine (XYLOCAINE) 5 % ointment, Apply 1 Application topically as needed., Disp: 35.44 g, Rfl: 0   LORazepam (ATIVAN) 1 MG tablet, Take 1 tablet (1 mg total) by mouth 2 (two) times daily as needed for anxiety., Disp: 10 tablet, Rfl: 0   magnesium oxide (MAG-OX) 400 (240 Mg) MG tablet, TAKE 1 TABLET BY MOUTH TWICE A DAY, Disp: 60 tablet, Rfl: 1   meloxicam (MOBIC) 15 MG tablet, TAKE 1 TABLET (15 MG TOTAL) BY MOUTH DAILY. FOR JOINT AND MUSCLE PAIN, Disp: 90 tablet, Rfl: 1   metoprolol (TOPROL-XL) 200 MG 24 hr tablet, TAKE 1 TABLET ONE TIME DAILY, WITH OR IMMEDIATELY FOLLOWING A MEAL, Disp: 90 tablet, Rfl: 3   Multiple Vitamin (MULTIVITAMIN) capsule, Take 1 capsule by mouth in the morning., Disp: , Rfl:    pantoprazole (PROTONIX) 40 MG tablet, TAKE 1 TABLET twice daily FOR STOMACH, Disp: 180 tablet, Rfl: 3  prochlorperazine (COMPAZINE) 10 MG tablet, Take 1 tablet (10 mg total) by mouth every 6 (six) hours as needed for nausea or vomiting., Disp: 30 tablet, Rfl: 1   traMADol (ULTRAM) 50 MG tablet, Take 1 tablet (50 mg total) by mouth every 6 (six) hours as needed for moderate pain or severe pain., Disp: 20 tablet, Rfl: 0   traZODone (DESYREL) 150 MG tablet, TAKE 1 OR 2 TABLETS AT BEDTIME FOR SLEEP, Disp: 180 tablet, Rfl: 3   TRUEplus Lancets 33G MISC, Test BS daily and as needed Dx E11.9, Disp: 100 each, Rfl: 3   zinc gluconate 50 MG tablet, Take 50 mg by mouth daily., Disp: , Rfl:     Zinc Oxide 20 % PSTE, Apply 1 Application topically 3 (three) times daily., Disp: 57 g, Rfl: 1   amLODipine (NORVASC) 10 MG tablet, 1 tablet daily (Patient not taking: Reported on 09/27/2022), Disp: 90 tablet, Rfl: 3   lidocaine-prilocaine (EMLA) cream, Apply to affected area once (Patient not taking: Reported on 09/27/2022), Disp: 30 g, Rfl: 3   ondansetron (ZOFRAN) 8 MG tablet, Take 1 tablet (8 mg total) by mouth every 8 (eight) hours as needed for nausea or vomiting. (Patient not taking: Reported on 09/27/2022), Disp: 30 tablet, Rfl: 3   valsartan (DIOVAN) 320 MG tablet, Take 1 tablet (320 mg total) by mouth daily. For blood pressure. (Patient not taking: Reported on 09/27/2022), Disp: 90 tablet, Rfl: 1 No current facility-administered medications for this visit.  Facility-Administered Medications Ordered in Other Visits:    sodium chloride flush (NS) 0.9 % injection 10 mL, 10 mL, Intracatheter, PRN, [name], MD, 10 mL at 09/27/22 1311   Allergies: Allergies  Allergen Reactions   Penicillins Shortness Of Breath   Sulfa Antibiotics Rash    REVIEW OF SYSTEMS:   Review of Systems  Constitutional:  Negative for chills, fatigue and fever.  HENT:   Negative for lump/mass, mouth sores, nosebleeds, sore throat and trouble swallowing.   Eyes:  Negative for eye problems.  Respiratory:  Negative for cough and shortness of breath.   Cardiovascular:  Negative for chest pain, leg swelling and palpitations.  Gastrointestinal:  Positive for diarrhea. Negative for abdominal pain, constipation, nausea and vomiting.  Genitourinary:  Negative for bladder incontinence, difficulty urinating, dysuria, frequency, hematuria and nocturia.   Musculoskeletal:  Negative for arthralgias, back pain, flank pain, myalgias and neck pain.  Skin:  Negative for itching and rash.  Neurological:  Negative for dizziness, headaches and numbness.  Hematological:  Does not bruise/bleed easily.   Psychiatric/Behavioral:  Negative for depression, sleep disturbance and suicidal ideas. The patient is not nervous/anxious.   All other systems reviewed and are negative.    VITALS:   There were no vitals taken for this visit.  Wt Readings from Last 3 Encounters:  09/27/22 130 lb 6.4 oz (59.1 kg)  09/19/22 125 lb 12.8 oz (57.1 kg)  09/12/22 128 lb 3.2 oz (58.2 kg)    There is no height or weight on file to calculate BMI.  Performance status (ECOG): 1 - Symptomatic but completely ambulatory  PHYSICAL EXAM:   Physical Exam Vitals and nursing note reviewed. Exam conducted with a chaperone present.  Constitutional:      Appearance: Normal appearance.  Cardiovascular:     Rate and Rhythm: Normal rate and regular rhythm.     Pulses: Normal pulses.     Heart sounds: Normal heart sounds.  Pulmonary:     Effort: Pulmonary effort is normal.  Breath sounds: Normal breath sounds.  Abdominal:     Palpations: Abdomen is soft. There is no hepatomegaly, splenomegaly or mass.     Tenderness: There is no abdominal tenderness.  Musculoskeletal:     Right lower leg: No edema.     Left lower leg: No edema.  Lymphadenopathy:     Cervical: No cervical adenopathy.     Right cervical: No superficial, deep or posterior cervical adenopathy.    Left cervical: No superficial, deep or posterior cervical adenopathy.     Upper Body:     Right upper body: No supraclavicular or axillary adenopathy.     Left upper body: No supraclavicular or axillary adenopathy.  Neurological:     General: No focal deficit present.     Mental Status: She is alert and oriented to person, place, and time.  Psychiatric:        Mood and Affect: Mood normal.        Behavior: Behavior normal.     LABS:      Latest Ref Rng & Units 09/27/2022    8:25 AM 09/19/2022    9:31 AM 09/12/2022    8:53 AM  CBC  WBC 4.0 - 10.5 K/uL 3.3  3.1  0.4   Hemoglobin 12.0 - 15.0 g/dL 7.8  7.4  9.0   Hematocrit 36.0 - 46.0 % 23.7   22.4  26.9   Platelets 150 - 400 K/uL 142  69  58       Latest Ref Rng & Units 09/27/2022    8:25 AM 09/19/2022    9:31 AM 09/12/2022    8:53 AM  CMP  Glucose 70 - 99 mg/dL 161  096  045   BUN 8 - 23 mg/dL 12  21  13    Creatinine 0.44 - 1.00 mg/dL 4.09  8.11  9.14   Sodium 135 - 145 mmol/L 136  136  138   Potassium 3.5 - 5.1 mmol/L 4.2  3.7  4.2   Chloride 98 - 111 mmol/L 100  100  98   CO2 22 - 32 mmol/L 25  27  26    Calcium 8.9 - 10.3 mg/dL 9.0  9.6  9.9   Total Protein 6.5 - 8.1 g/dL 6.1  6.6  5.9   Total Bilirubin 0.3 - 1.2 mg/dL 0.5  0.8  0.7   Alkaline Phos 38 - 126 U/L 94  109  167   AST 15 - 41 U/L 19  22  14    ALT 0 - 44 U/L 19  35  15      No results found for: "CEA1", "CEA" / No results found for: "CEA1", "CEA" No results found for: "PSA1" No results found for: "NWG956" No results found for: "CAN125"  No results found for: "TOTALPROTELP", "ALBUMINELP", "A1GS", "A2GS", "BETS", "BETA2SER", "GAMS", "MSPIKE", "SPEI" No results found for: "TIBC", "FERRITIN", "IRONPCTSAT" No results found for: "LDH"   STUDIES:   No results found.

## 2022-09-27 ENCOUNTER — Inpatient Hospital Stay: Payer: No Typology Code available for payment source

## 2022-09-27 ENCOUNTER — Inpatient Hospital Stay: Payer: No Typology Code available for payment source | Admitting: Hematology

## 2022-09-27 VITALS — BP 149/71 | HR 72 | Temp 96.8°F | Resp 18

## 2022-09-27 DIAGNOSIS — C50811 Malignant neoplasm of overlapping sites of right female breast: Secondary | ICD-10-CM | POA: Diagnosis not present

## 2022-09-27 DIAGNOSIS — Z171 Estrogen receptor negative status [ER-]: Secondary | ICD-10-CM

## 2022-09-27 DIAGNOSIS — Z5112 Encounter for antineoplastic immunotherapy: Secondary | ICD-10-CM | POA: Diagnosis not present

## 2022-09-27 LAB — COMPREHENSIVE METABOLIC PANEL
ALT: 19 U/L (ref 0–44)
AST: 19 U/L (ref 15–41)
Albumin: 3.2 g/dL — ABNORMAL LOW (ref 3.5–5.0)
Alkaline Phosphatase: 94 U/L (ref 38–126)
Anion gap: 11 (ref 5–15)
BUN: 12 mg/dL (ref 8–23)
CO2: 25 mmol/L (ref 22–32)
Calcium: 9 mg/dL (ref 8.9–10.3)
Chloride: 100 mmol/L (ref 98–111)
Creatinine, Ser: 0.85 mg/dL (ref 0.44–1.00)
GFR, Estimated: >60 mL/min (ref >60)
Glucose, Bld: 114 mg/dL — ABNORMAL HIGH (ref 70–99)
Potassium: 4.2 mmol/L (ref 3.5–5.1)
Sodium: 136 mmol/L (ref 135–145)
Total Bilirubin: 0.5 mg/dL (ref 0.3–1.2)
Total Protein: 6.1 g/dL — ABNORMAL LOW (ref 6.5–8.1)

## 2022-09-27 LAB — CBC WITH DIFFERENTIAL/PLATELET
Abs Immature Granulocytes: 0.02 10*3/uL (ref 0.00–0.07)
Basophils Absolute: 0 10*3/uL (ref 0.0–0.1)
Basophils Relative: 1 %
Eosinophils Absolute: 0 10*3/uL (ref 0.0–0.5)
Eosinophils Relative: 0 %
HCT: 23.7 % — ABNORMAL LOW (ref 36.0–46.0)
Hemoglobin: 7.8 g/dL — ABNORMAL LOW (ref 12.0–15.0)
Immature Granulocytes: 1 %
Lymphocytes Relative: 17 %
Lymphs Abs: 0.6 10*3/uL — ABNORMAL LOW (ref 0.7–4.0)
MCH: 32.2 pg (ref 26.0–34.0)
MCHC: 32.9 g/dL (ref 30.0–36.0)
MCV: 97.9 fL (ref 80.0–100.0)
Monocytes Absolute: 0.3 10*3/uL (ref 0.1–1.0)
Monocytes Relative: 10 %
Neutro Abs: 2.4 10*3/uL (ref 1.7–7.7)
Neutrophils Relative %: 71 %
Platelets: 142 10*3/uL — ABNORMAL LOW (ref 150–400)
RBC: 2.42 MIL/uL — ABNORMAL LOW (ref 3.87–5.11)
RDW: 18.5 % — ABNORMAL HIGH (ref 11.5–15.5)
WBC: 3.3 10*3/uL — ABNORMAL LOW (ref 4.0–10.5)
nRBC: 0 % (ref 0.0–0.2)

## 2022-09-27 LAB — MAGNESIUM: Magnesium: 1.8 mg/dL (ref 1.7–2.4)

## 2022-09-27 LAB — SAMPLE TO BLOOD BANK

## 2022-09-27 MED ORDER — SODIUM CHLORIDE 0.9 % IV SOLN: Freq: Once | INTRAVENOUS | Status: AC

## 2022-09-27 MED ORDER — HEPARIN SOD (PORK) LOCK FLUSH 100 UNIT/ML IV SOLN
500.0000 [IU] | Freq: Once | INTRAVENOUS | Status: AC | PRN
  Administered 2022-09-27: 500 [IU]

## 2022-09-27 MED ORDER — SODIUM CHLORIDE 0.9 % IV SOLN
200.0000 mg | Freq: Once | INTRAVENOUS | Status: AC
  Administered 2022-09-27: 200 mg via INTRAVENOUS
  Filled 2022-09-27: qty 8

## 2022-09-27 MED ORDER — SODIUM CHLORIDE 0.9 % IV SOLN
150.0000 mg | Freq: Once | INTRAVENOUS | Status: AC
  Administered 2022-09-27: 150 mg via INTRAVENOUS
  Filled 2022-09-27: qty 150

## 2022-09-27 MED ORDER — SODIUM CHLORIDE 0.9% FLUSH
10.0000 mL | INTRAVENOUS | Status: AC
  Administered 2022-09-27: 10 mL

## 2022-09-27 MED ORDER — PALONOSETRON HCL INJECTION 0.25 MG/5ML
0.2500 mg | Freq: Once | INTRAVENOUS | Status: AC
  Administered 2022-09-27: 0.25 mg via INTRAVENOUS
  Filled 2022-09-27: qty 5

## 2022-09-27 MED ORDER — SODIUM CHLORIDE 0.9 % IV SOLN
600.0000 mg/m2 | Freq: Once | INTRAVENOUS | Status: AC
  Administered 2022-09-27: 1000 mg via INTRAVENOUS
  Filled 2022-09-27: qty 50

## 2022-09-27 MED ORDER — SODIUM CHLORIDE 0.9% FLUSH
10.0000 mL | INTRAVENOUS | Status: DC | PRN
  Administered 2022-09-27: 10 mL

## 2022-09-27 MED ORDER — DOXORUBICIN HCL CHEMO IV INJECTION 2 MG/ML
60.0000 mg/m2 | Freq: Once | INTRAVENOUS | Status: AC
  Administered 2022-09-27: 98 mg via INTRAVENOUS
  Filled 2022-09-27: qty 49

## 2022-09-27 NOTE — Patient Instructions (Signed)
[facility] at [facility] Discharge Instructions   You were seen and examined today by Dr. Ellin Saba.  He reviewed the results of your lab work which are mostly normal/stable. Your hemoglobin is 7.8 today.   We will proceed with your treatment today.   Return as scheduled.    Thank you for choosing [facility] at [facility] to provide your oncology and hematology care.  To afford each patient quality time with our provider, please arrive at least 15 minutes before your scheduled appointment time.   If you have a lab appointment with the Cancer Center please come in thru the Main Entrance and check in at the main information desk.  You need to re-schedule your appointment should you arrive 10 or more minutes late.  We strive to give you quality time with our providers, and arriving late affects you and other patients whose appointments are after yours.  Also, if you no show three or more times for appointments you may be dismissed from the clinic at the providers discretion.     Again, thank you for choosing [facility].  Our hope is that these requests will decrease the amount of time that you wait before being seen by our physicians.       _____________________________________________________________  Should you have questions after your visit to [facility], please contact our office at [phone] and follow the prompts.  Our office hours are 8:00 a.m. and 4:30 p.m. Monday - Friday.  Please note that voicemails left after 4:00 p.m. may not be returned until the following business day.  We are closed weekends and major holidays.  You do have access to a nurse 24-7, just call the main number to the clinic [phone] and do not press any options, hold on the line and a nurse will answer the phone.    For prescription refill requests, have your pharmacy contact our office and allow 72 hours.    Due to Covid, you  will need to wear a mask upon entering the hospital. If you do not have a mask, a mask will be given to you at the Main Entrance upon arrival. For doctor visits, patients may have 1 support person age 29 or older with them. For treatment visits, patients can not have anyone with them due to social distancing guidelines and our immunocompromised population.

## 2022-09-27 NOTE — Patient Instructions (Signed)
[facility]  Discharge Instructions: Thank you for choosing [facility] to provide your oncology and hematology care.  If you have a lab appointment with the Cancer Center - please note that after April 8th, 2024, all labs will be drawn in the cancer center.  You do not have to check in or register with the main entrance as you have in the past but will complete your check-in in the cancer center.  Wear comfortable clothing and clothing appropriate for easy access to any Portacath or PICC line.   We strive to give you quality time with your provider. You may need to reschedule your appointment if you arrive late (15 or more minutes).  Arriving late affects you and other patients whose appointments are after yours.  Also, if you miss three or more appointments without notifying the office, you may be dismissed from the clinic at the provider's discretion.      For prescription refill requests, have your pharmacy contact our office and allow 72 hours for refills to be completed.    Today you received the following chemotherapy/immunotherapy Keytruda, Doxorubicin, and Cytoxan.   Cyclophosphamide Injection What is this medication? CYCLOPHOSPHAMIDE (sye kloe FOSS fa mide) treats some types of cancer. It works by slowing down the growth of cancer cells. This medicine may be used for other purposes; ask your health care provider or pharmacist if you have questions. COMMON BRAND NAME(S): Cyclophosphamide, Cytoxan, Neosar What should I tell my care team before I take this medication? They need to know if you have any of these conditions: Heart disease Irregular heartbeat or rhythm Infection Kidney problems Liver disease Low blood cell levels (white cells, platelets, or red blood cells) Lung disease Previous radiation Trouble passing urine An unusual or allergic reaction to cyclophosphamide, other medications, foods, dyes, or preservatives Pregnant or trying to  get pregnant Breast-feeding How should I use this medication? This medication is injected into a vein. It is given by your care team in a hospital or clinic setting. Talk to your care team about the use of this medication in children. Special care may be needed. Overdosage: If you think you have taken too much of this medicine contact a poison control center or emergency room at once. NOTE: This medicine is only for you. Do not share this medicine with others. What if I miss a dose? Keep appointments for follow-up doses. It is important not to miss your dose. Call your care team if you are unable to keep an appointment. What may interact with this medication? Amphotericin B Amiodarone Azathioprine Certain antivirals for HIV or hepatitis Certain medications for blood pressure, such as enalapril, lisinopril, quinapril Cyclosporine Diuretics Etanercept Indomethacin Medications that relax muscles Metronidazole Natalizumab Tamoxifen Warfarin This list may not describe all possible interactions. Give your health care provider a list of all the medicines, herbs, non-prescription drugs, or dietary supplements you use. Also tell them if you smoke, drink alcohol, or use illegal drugs. Some items may interact with your medicine. What should I watch for while using this medication? This medication may make you feel generally unwell. This is not uncommon as chemotherapy can affect healthy cells as well as cancer cells. Report any side effects. Continue your course of treatment even though you feel ill unless your care team tells you to stop. You may need blood work while you are taking this medication. This medication may increase your risk of getting an infection. Call your care team for advice  if you get a fever, chills, sore throat, or other symptoms of a cold or flu. Do not treat yourself. Try to avoid being around people who are sick. Avoid taking medications that contain aspirin, acetaminophen,  ibuprofen, naproxen, or ketoprofen unless instructed by your care team. These medications may hide a fever. Be careful brushing or flossing your teeth or using a toothpick because you may get an infection or bleed more easily. If you have any dental work done, tell your dentist you are receiving this medication. Drink water or other fluids as directed. Urinate often, even at night. Some products may contain alcohol. Ask your care team if this medication contains alcohol. Be sure to tell all care teams you are taking this medicine. Certain medicines, like metronidazole and disulfiram, can cause an unpleasant reaction when taken with alcohol. The reaction includes flushing, headache, nausea, vomiting, sweating, and increased thirst. The reaction can last from 30 minutes to several hours. Talk to your care team if you wish to become pregnant or think you might be pregnant. This medication can cause serious birth defects if taken during pregnancy and for 1 year after the last dose. A negative pregnancy test is required before starting this medication. A reliable form of contraception is recommended while taking this medication and for 1 year after the last dose. Talk to your care team about reliable forms of contraception. Do not father a child while taking this medication and for 4 months after the last dose. Use a condom during this time period. Do not breast-feed while taking this medication or for 1 week after the last dose. This medication may cause infertility. Talk to your care team if you are concerned about your fertility. Talk to your care team about your risk of cancer. You may be more at risk for certain types of cancer if you take this medication. What side effects may I notice from receiving this medication? Side effects that you should report to your care team as soon as possible: Allergic reactions--skin rash, itching, hives, swelling of the face, lips, tongue, or throat Dry cough, shortness  of breath or trouble breathing Heart failure--shortness of breath, swelling of the ankles, feet, or hands, sudden weight gain, unusual weakness or fatigue Heart muscle inflammation--unusual weakness or fatigue, shortness of breath, chest pain, fast or irregular heartbeat, dizziness, swelling of the ankles, feet, or hands Heart rhythm changes--fast or irregular heartbeat, dizziness, feeling faint or lightheaded, chest pain, trouble breathing Infection--fever, chills, cough, sore throat, wounds that don't heal, pain or trouble when passing urine, general feeling of discomfort or being unwell Kidney injury--decrease in the amount of urine, swelling of the ankles, hands, or feet Liver injury--right upper belly pain, loss of appetite, nausea, light-colored stool, dark yellow or brown urine, yellowing skin or eyes, unusual weakness or fatigue Low red blood cell level--unusual weakness or fatigue, dizziness, headache, trouble breathing Low sodium level--muscle weakness, fatigue, dizziness, headache, confusion Red or dark brown urine Unusual bruising or bleeding Side effects that usually do not require medical attention (report to your care team if they continue or are bothersome): Hair loss Irregular menstrual cycles or spotting Loss of appetite Nausea Pain, redness, or swelling with sores inside the mouth or throat Vomiting This list may not describe all possible side effects. Call your doctor for medical advice about side effects. You may report side effects to FDA at 1-800-FDA-1088. Where should I keep my medication? This medication is given in a hospital or clinic. It will not be  stored at home. NOTE: This sheet is a summary. It may not cover all possible information. If you have questions about this medicine, talk to your doctor, pharmacist, or health care provider.  2024 Elsevier/Gold Standard (2021-09-08 00:00:00) Doxorubicin Injection What is this medication? DOXORUBICIN (dox oh ROO bi  sin) treats some types of cancer. It works by slowing down the growth of cancer cells. This medicine may be used for other purposes; ask your health care provider or pharmacist if you have questions. COMMON BRAND NAME(S): Adriamycin, Adriamycin PFS, Adriamycin RDF, Rubex What should I tell my care team before I take this medication? They need to know if you have any of these conditions: Heart disease History of low blood cell levels caused by a medication Liver disease Recent or ongoing radiation An unusual or allergic reaction to doxorubicin, other medications, foods, dyes, or preservatives If you or your partner are pregnant or trying to get pregnant Breast-feeding How should I use this medication? This medication is injected into a vein. It is given by your care team in a hospital or clinic setting. Talk to your care team about the use of this medication in children. Special care may be needed. Overdosage: If you think you have taken too much of this medicine contact a poison control center or emergency room at once. NOTE: This medicine is only for you. Do not share this medicine with others. What if I miss a dose? Keep appointments for follow-up doses. It is important not to miss your dose. Call your care team if you are unable to keep an appointment. What may interact with this medication? 6-mercaptopurine Paclitaxel Phenytoin St. John's wort Trastuzumab Verapamil This list may not describe all possible interactions. Give your health care provider a list of all the medicines, herbs, non-prescription drugs, or dietary supplements you use. Also tell them if you smoke, drink alcohol, or use illegal drugs. Some items may interact with your medicine. What should I watch for while using this medication? Your condition will be monitored carefully while you are receiving this medication. You may need blood work while taking this medication. This medication may make you feel generally  unwell. This is not uncommon as chemotherapy can affect healthy cells as well as cancer cells. Report any side effects. Continue your course of treatment even though you feel ill unless your care team tells you to stop. There is a maximum amount of this medication you should receive throughout your life. The amount depends on the medical condition being treated and your overall health. Your care team will watch how much of this medication you receive. Tell your care team if you have taken this medication before. Your urine may turn red for a few days after your dose. This is not blood. If your urine is dark or brown, call your care team. In some cases, you may be given additional medications to help with side effects. Follow all directions for their use. This medication may increase your risk of getting an infection. Call your care team for advice if you get a fever, chills, sore throat, or other symptoms of a cold or flu. Do not treat yourself. Try to avoid being around people who are sick. This medication may increase your risk to bruise or bleed. Call your care team if you notice any unusual bleeding. Talk to your care team about your risk of cancer. You may be more at risk for certain types of cancers if you take this medication. You should make sure  that you get enough Coenzyme Q10 while you are taking this medication. Discuss the foods you eat and the vitamins you take with your care team. Talk to your care team if you or your partner may be pregnant. Serious birth defects can occur if you take this medication during pregnancy and for 6 months after the last dose. Contraception is recommended while taking this medication and for 6 months after the last dose. Your care team can help you find the option that works for you. If your partner can get pregnant, use a condom while taking this medication and for 6 months after the last dose. Do not breastfeed while taking this medication. This medication may  cause infertility. Talk to your care team if you are concerned about your fertility. What side effects may I notice from receiving this medication? Side effects that you should report to your care team as soon as possible: Allergic reactions--skin rash, itching, hives, swelling of the face, lips, tongue, or throat Heart failure--shortness of breath, swelling of the ankles, feet, or hands, sudden weight gain, unusual weakness or fatigue Heart rhythm changes--fast or irregular heartbeat, dizziness, feeling faint or lightheaded, chest pain, trouble breathing Infection--fever, chills, cough, sore throat, wounds that don't heal, pain or trouble when passing urine, general feeling of discomfort or being unwell Low red blood cell level--unusual weakness or fatigue, dizziness, headache, trouble breathing Painful swelling, warmth, or redness of the skin, blisters or sores at the infusion site Unusual bruising or bleeding Side effects that usually do not require medical attention (report to your care team if they continue or are bothersome): Diarrhea Hair loss Nausea Pain, redness, or swelling with sores inside the mouth or throat Red urine This list may not describe all possible side effects. Call your doctor for medical advice about side effects. You may report side effects to FDA at 1-800-FDA-1088. Where should I keep my medication? This medication is given in a hospital or clinic. It will not be stored at home. NOTE: This sheet is a summary. It may not cover all possible information. If you have questions about this medicine, talk to your doctor, pharmacist, or health care provider.  2024 Elsevier/Gold Standard (2021-08-31 00:00:00) Pembrolizumab Injection What is this medication? PEMBROLIZUMAB (PEM broe LIZ ue mab) treats some types of cancer. It works by helping your immune system slow or stop the spread of cancer cells. It is a monoclonal antibody. This medicine may be used for other purposes;  ask your health care provider or pharmacist if you have questions. COMMON BRAND NAME(S): Keytruda What should I tell my care team before I take this medication? They need to know if you have any of these conditions: Allogeneic stem cell transplant (uses someone else's stem cells) Autoimmune diseases, such as Crohn disease, ulcerative colitis, lupus History of chest radiation Nervous system problems, such as Guillain-Barre syndrome, myasthenia gravis Organ transplant An unusual or allergic reaction to pembrolizumab, other medications, foods, dyes, or preservatives Pregnant or trying to get pregnant Breast-feeding How should I use this medication? This medication is injected into a vein. It is given by your care team in a hospital or clinic setting. A special MedGuide will be given to you before each treatment. Be sure to read this information carefully each time. Talk to your care team about the use of this medication in children. While it may be prescribed for children as young as 6 months for selected conditions, precautions do apply. Overdosage: If you think you have taken too much  of this medicine contact a poison control center or emergency room at once. NOTE: This medicine is only for you. Do not share this medicine with others. What if I miss a dose? Keep appointments for follow-up doses. It is important not to miss your dose. Call your care team if you are unable to keep an appointment. What may interact with this medication? Interactions have not been studied. This list may not describe all possible interactions. Give your health care provider a list of all the medicines, herbs, non-prescription drugs, or dietary supplements you use. Also tell them if you smoke, drink alcohol, or use illegal drugs. Some items may interact with your medicine. What should I watch for while using this medication? Your condition will be monitored carefully while you are receiving this medication. You may  need blood work while taking this medication. This medication may cause serious skin reactions. They can happen weeks to months after starting the medication. Contact your care team right away if you notice fevers or flu-like symptoms with a rash. The rash may be red or purple and then turn into blisters or peeling of the skin. You may also notice a red rash with swelling of the face, lips, or lymph nodes in your neck or under your arms. Tell your care team right away if you have any change in your eyesight. Talk to your care team if you may be pregnant. Serious birth defects can occur if you take this medication during pregnancy and for 4 months after the last dose. You will need a negative pregnancy test before starting this medication. Contraception is recommended while taking this medication and for 4 months after the last dose. Your care team can help you find the option that works for you. Do not breastfeed while taking this medication and for 4 months after the last dose. What side effects may I notice from receiving this medication? Side effects that you should report to your care team as soon as possible: Allergic reactions--skin rash, itching, hives, swelling of the face, lips, tongue, or throat Dry cough, shortness of breath or trouble breathing Eye pain, redness, irritation, or discharge with blurry or decreased vision Heart muscle inflammation--unusual weakness or fatigue, shortness of breath, chest pain, fast or irregular heartbeat, dizziness, swelling of the ankles, feet, or hands Hormone gland problems--headache, sensitivity to light, unusual weakness or fatigue, dizziness, fast or irregular heartbeat, increased sensitivity to cold or heat, excessive sweating, constipation, hair loss, increased thirst or amount of urine, tremors or shaking, irritability Infusion reactions--chest pain, shortness of breath or trouble breathing, feeling faint or lightheaded Kidney injury  (glomerulonephritis)--decrease in the amount of urine, red or dark brown urine, foamy or bubbly urine, swelling of the ankles, hands, or feet Liver injury--right upper belly pain, loss of appetite, nausea, light-colored stool, dark yellow or brown urine, yellowing skin or eyes, unusual weakness or fatigue Pain, tingling, or numbness in the hands or feet, muscle weakness, change in vision, confusion or trouble speaking, loss of balance or coordination, trouble walking, seizures Rash, fever, and swollen lymph nodes Redness, blistering, peeling, or loosening of the skin, including inside the mouth Sudden or severe stomach pain, bloody diarrhea, fever, nausea, vomiting Side effects that usually do not require medical attention (report to your care team if they continue or are bothersome): Bone, joint, or muscle pain Diarrhea Fatigue Loss of appetite Nausea Skin rash This list may not describe all possible side effects. Call your doctor for medical advice about side effects. You  may report side effects to FDA at 1-800-FDA-1088. Where should I keep my medication? This medication is given in a hospital or clinic. It will not be stored at home. NOTE: This sheet is a summary. It may not cover all possible information. If you have questions about this medicine, talk to your doctor, pharmacist, or health care provider.  2024 Elsevier/Gold Standard (2021-09-05 00:00:00)    To help prevent nausea and vomiting after your treatment, we encourage you to take your nausea medication as directed.  BELOW ARE SYMPTOMS THAT SHOULD BE REPORTED IMMEDIATELY: *FEVER GREATER THAN 100.4 F (38 C) OR HIGHER *CHILLS OR SWEATING *NAUSEA AND VOMITING THAT IS NOT CONTROLLED WITH YOUR NAUSEA MEDICATION *UNUSUAL SHORTNESS OF BREATH *UNUSUAL BRUISING OR BLEEDING *URINARY PROBLEMS (pain or burning when urinating, or frequent urination) *BOWEL PROBLEMS (unusual diarrhea, constipation, pain near the anus) TENDERNESS IN  MOUTH AND THROAT WITH OR WITHOUT PRESENCE OF ULCERS (sore throat, sores in mouth, or a toothache) UNUSUAL RASH, SWELLING OR PAIN  UNUSUAL VAGINAL DISCHARGE OR ITCHING   Items with * indicate a potential emergency and should be followed up as soon as possible or go to the Emergency Department if any problems should occur.  Please show the CHEMOTHERAPY ALERT CARD or IMMUNOTHERAPY ALERT CARD at check-in to the Emergency Department and triage nurse.  Should you have questions after your visit or need to cancel or reschedule your appointment, please contact [facility] [phone]  and follow the prompts.  Office hours are 8:00 a.m. to 4:30 p.m. Monday - Friday. Please note that voicemails left after 4:00 p.m. may not be returned until the following business day.  We are closed weekends and major holidays. You have access to a nurse at all times for urgent questions. Please call the main number to the clinic [phone] and follow the prompts.  For any non-urgent questions, you may also contact your provider using MyChart. We now offer e-Visits for anyone 63 and older to request care online for non-urgent symptoms. For details visit [email or web address].   Also download the MyChart app! Go to the app store, search "MyChart", open the app, select [place], and log in with your MyChart username and password.

## 2022-09-27 NOTE — Progress Notes (Signed)
Patient has been examined by Dr. Ellin Saba. Vital signs and labs have been reviewed by MD - ANC, Creatinine, LFTs, hemoglobin (7.8), and platelets are within treatment parameters per M.D. - pt may proceed with treatment.  Primary RN and pharmacy notified.

## 2022-09-27 NOTE — Progress Notes (Signed)
Patient presents today for chemotherapy/immunotherapy infusion of Keytruda, Doxorubicin, and Cytoxan. Patient is in satisfactory condition with no new complaints voiced.  Vital signs are stable.  Labs reviewed by Dr. Ellin Saba during the office visit and all labs are within treatment parameters with exception of Hgb 7.8. We will proceed with treatment per MD orders.   Patient tolerated treatment well with no complaints voiced.  Patient left via a wheelchair in stable condition with family. Vital signs stable at discharge.  Follow up as scheduled.

## 2022-09-28 ENCOUNTER — Inpatient Hospital Stay: Payer: No Typology Code available for payment source

## 2022-09-28 VITALS — BP 131/82 | HR 89 | Temp 97.0°F | Resp 18

## 2022-09-28 DIAGNOSIS — Z5112 Encounter for antineoplastic immunotherapy: Secondary | ICD-10-CM | POA: Diagnosis not present

## 2022-09-28 DIAGNOSIS — Z171 Estrogen receptor negative status [ER-]: Secondary | ICD-10-CM

## 2022-09-28 MED ORDER — PEGFILGRASTIM-CBQV 6 MG/0.6ML ~~LOC~~ SOSY
6.0000 mg | PREFILLED_SYRINGE | Freq: Once | SUBCUTANEOUS | Status: AC
  Administered 2022-09-28: 6 mg via SUBCUTANEOUS
  Filled 2022-09-28: qty 0.6

## 2022-09-28 NOTE — Patient Instructions (Signed)
[facility] [place]  Discharge Instructions: Thank you for choosing [facility] to provide your oncology and hematology care.  If you have a lab appointment with the Cancer Center - please note that after April 8th, 2024, all labs will be drawn in the cancer center.  You do not have to check in or register with the main entrance as you have in the past but will complete your check-in in the cancer center.  Wear comfortable clothing and clothing appropriate for easy access to any Portacath or PICC line.   We strive to give you quality time with your provider. You may need to reschedule your appointment if you arrive late (15 or more minutes).  Arriving late affects you and other patients whose appointments are after yours.  Also, if you miss three or more appointments without notifying the office, you may be dismissed from the clinic at the provider's discretion.      For prescription refill requests, have your pharmacy contact our office and allow 72 hours for refills to be completed.    Today you received the following chemotherapy and/or immunotherapy agents Udenyca.  Pegfilgrastim Injection What is this medication? PEGFILGRASTIM (PEG fil gra stim) lowers the risk of infection in people who are receiving chemotherapy. It works by helping your body make more white blood cells, which protects your body from infection. It may also be used to help people who have been exposed to high doses of radiation. This medicine may be used for other purposes; ask your health care provider or pharmacist if you have questions. COMMON BRAND NAME(S): Fulphila, Fylnetra, Neulasta, Nyvepria, Stimufend, UDENYCA, UDENYCA ONBODY, Ziextenzo What should I tell my care team before I take this medication? They need to know if you have any of these conditions: Kidney disease Latex allergy Ongoing radiation therapy Sickle cell disease Skin reactions to acrylic adhesives (On-Body Injector  only) An unusual or allergic reaction to pegfilgrastim, filgrastim, other medications, foods, dyes, or preservatives Pregnant or trying to get pregnant Breast-feeding How should I use this medication? This medication is for injection under the skin. If you get this medication at home, you will be taught how to prepare and give the pre-filled syringe or how to use the On-body Injector. Refer to the patient Instructions for Use for detailed instructions. Use exactly as directed. Tell your care team immediately if you suspect that the On-body Injector may not have performed as intended or if you suspect the use of the On-body Injector resulted in a missed or partial dose. It is important that you put your used needles and syringes in a special sharps container. Do not put them in a trash can. If you do not have a sharps container, call your pharmacist or care team to get one. Talk to your care team about the use of this medication in children. While this medication may be prescribed for selected conditions, precautions do apply. Overdosage: If you think you have taken too much of this medicine contact a poison control center or emergency room at once. NOTE: This medicine is only for you. Do not share this medicine with others. What if I miss a dose? It is important not to miss your dose. Call your care team if you miss your dose. If you miss a dose due to an On-body Injector failure or leakage, a new dose should be administered as soon as possible using a single prefilled syringe for manual use. What may interact with this medication? Interactions   have not been studied. This list may not describe all possible interactions. Give your health care provider a list of all the medicines, herbs, non-prescription drugs, or dietary supplements you use. Also tell them if you smoke, drink alcohol, or use illegal drugs. Some items may interact with your medicine. What should I watch for while using this  medication? Your condition will be monitored carefully while you are receiving this medication. You may need blood work done while you are taking this medication. Talk to your care team about your risk of cancer. You may be more at risk for certain types of cancer if you take this medication. If you are going to need a MRI, CT scan, or other procedure, tell your care team that you are using this medication (On-Body Injector only). What side effects may I notice from receiving this medication? Side effects that you should report to your care team as soon as possible: Allergic reactions--skin rash, itching, hives, swelling of the face, lips, tongue, or throat Capillary leak syndrome--stomach or muscle pain, unusual weakness or fatigue, feeling faint or lightheaded, decrease in the amount of urine, swelling of the ankles, hands, or feet, trouble breathing High white blood cell level--fever, fatigue, trouble breathing, night sweats, change in vision, weight loss Inflammation of the aorta--fever, fatigue, back, chest, or stomach pain, severe headache Kidney injury (glomerulonephritis)--decrease in the amount of urine, red or dark Jessee Mezera urine, foamy or bubbly urine, swelling of the ankles, hands, or feet Shortness of breath or trouble breathing Spleen injury--pain in upper left stomach or shoulder Unusual bruising or bleeding Side effects that usually do not require medical attention (report to your care team if they continue or are bothersome): Bone pain Pain in the hands or feet This list may not describe all possible side effects. Call your doctor for medical advice about side effects. You may report side effects to FDA at 1-800-FDA-1088. Where should I keep my medication? Keep out of the reach of children. If you are using this medication at home, you will be instructed on how to store it. Throw away any unused medication after the expiration date on the label. NOTE: This sheet is a summary. It  may not cover all possible information. If you have questions about this medicine, talk to your doctor, pharmacist, or health care provider.  2024 Elsevier/Gold Standard (2021-03-24 00:00:00)        To help prevent nausea and vomiting after your treatment, we encourage you to take your nausea medication as directed.  BELOW ARE SYMPTOMS THAT SHOULD BE REPORTED IMMEDIATELY: *FEVER GREATER THAN 100.4 F (38 C) OR HIGHER *CHILLS OR SWEATING *NAUSEA AND VOMITING THAT IS NOT CONTROLLED WITH YOUR NAUSEA MEDICATION *UNUSUAL SHORTNESS OF BREATH *UNUSUAL BRUISING OR BLEEDING *URINARY PROBLEMS (pain or burning when urinating, or frequent urination) *BOWEL PROBLEMS (unusual diarrhea, constipation, pain near the anus) TENDERNESS IN MOUTH AND THROAT WITH OR WITHOUT PRESENCE OF ULCERS (sore throat, sores in mouth, or a toothache) UNUSUAL RASH, SWELLING OR PAIN  UNUSUAL VAGINAL DISCHARGE OR ITCHING   Items with * indicate a potential emergency and should be followed up as soon as possible or go to the Emergency Department if any problems should occur.  Please show the CHEMOTHERAPY ALERT CARD or IMMUNOTHERAPY ALERT CARD at check-in to the Emergency Department and triage nurse.  Should you have questions after your visit or need to cancel or reschedule your appointment, please contact [facility] [phone]  and follow the prompts.  Office hours   are 8:00 a.m. to 4:30 p.m. Monday - Friday. Please note that voicemails left after 4:00 p.m. may not be returned until the following business day.  We are closed weekends and major holidays. You have access to a nurse at all times for urgent questions. Please call the main number to the clinic [phone] and follow the prompts.  For any non-urgent questions, you may also contact your provider using MyChart. We now offer e-Visits for anyone 18 and older to request care online for non-urgent symptoms. For details visit  [email or web address].   Also download the MyChart app! Go to the app store, search "MyChart", open the app, select [facility], and log in with your MyChart username and password.   

## 2022-09-28 NOTE — Progress Notes (Signed)
Patient tolerated Udenyca injection with no complaints voiced.  Site clean and dry with no bruising or swelling noted.  No complaints of pain.  Discharged with vital signs stable and no signs or symptoms of distress noted.  

## 2022-10-03 ENCOUNTER — Inpatient Hospital Stay: Payer: No Typology Code available for payment source

## 2022-10-03 ENCOUNTER — Other Ambulatory Visit: Payer: Self-pay | Admitting: Surgery

## 2022-10-03 ENCOUNTER — Other Ambulatory Visit: Payer: No Typology Code available for payment source

## 2022-10-03 VITALS — BP 128/72 | HR 78 | Temp 97.5°F | Resp 18

## 2022-10-03 DIAGNOSIS — C50911 Malignant neoplasm of unspecified site of right female breast: Secondary | ICD-10-CM

## 2022-10-03 DIAGNOSIS — Z171 Estrogen receptor negative status [ER-]: Secondary | ICD-10-CM

## 2022-10-03 DIAGNOSIS — Z5112 Encounter for antineoplastic immunotherapy: Secondary | ICD-10-CM | POA: Diagnosis not present

## 2022-10-03 DIAGNOSIS — Z95828 Presence of other vascular implants and grafts: Secondary | ICD-10-CM

## 2022-10-03 DIAGNOSIS — Z170 Estrogen receptor positive status [ER+]: Secondary | ICD-10-CM | POA: Diagnosis not present

## 2022-10-03 LAB — CBC WITH DIFFERENTIAL/PLATELET
Abs Immature Granulocytes: 0 10*3/uL (ref 0.00–0.07)
Basophils Absolute: 0 10*3/uL (ref 0.0–0.1)
Basophils Relative: 0 %
Eosinophils Absolute: 0 10*3/uL (ref 0.0–0.5)
Eosinophils Relative: 0 %
HCT: 18.3 % — ABNORMAL LOW (ref 36.0–46.0)
Hemoglobin: 6.2 g/dL — CL (ref 12.0–15.0)
Lymphocytes Relative: 50 %
Lymphs Abs: 0.2 10*3/uL — ABNORMAL LOW (ref 0.7–4.0)
MCH: 33 pg (ref 26.0–34.0)
MCHC: 33.9 g/dL (ref 30.0–36.0)
MCV: 97.3 fL (ref 80.0–100.0)
Monocytes Absolute: 0 10*3/uL — ABNORMAL LOW (ref 0.1–1.0)
Monocytes Relative: 8 %
Neutro Abs: 0.2 10*3/uL — CL (ref 1.7–7.7)
Neutrophils Relative %: 42 %
Platelets: 98 10*3/uL — ABNORMAL LOW (ref 150–400)
RBC: 1.88 MIL/uL — ABNORMAL LOW (ref 3.87–5.11)
RDW: 18.5 % — ABNORMAL HIGH (ref 11.5–15.5)
WBC: 0.4 10*3/uL — CL (ref 4.0–10.5)
nRBC: 0 % (ref 0.0–0.2)

## 2022-10-03 LAB — TYPE AND SCREEN: Antibody Screen: NEGATIVE

## 2022-10-03 LAB — SAMPLE TO BLOOD BANK

## 2022-10-03 LAB — PREPARE RBC (CROSSMATCH)

## 2022-10-03 MED ORDER — SODIUM CHLORIDE 0.9% FLUSH
10.0000 mL | Freq: Once | INTRAVENOUS | Status: AC
  Administered 2022-10-03: 10 mL via INTRAVENOUS

## 2022-10-03 MED ORDER — HEPARIN SOD (PORK) LOCK FLUSH 100 UNIT/ML IV SOLN
500.0000 [IU] | Freq: Once | INTRAVENOUS | Status: AC
  Administered 2022-10-03: 500 [IU] via INTRAVENOUS

## 2022-10-03 NOTE — Progress Notes (Signed)
Port flushed with good blood return noted. No bruising or swelling at site. Bandaid applied and patient discharged in satisfactory condition. VVS stable with no signs or symptoms of distressed noted. 

## 2022-10-03 NOTE — Progress Notes (Signed)
CRITICAL VALUE ALERT Critical value received:  WBC 0.4, ANC 0.1, HGB 6.2 Date of notification:  10-03-22 Time of notification: 1525 Critical value read back:  Yes.   Nurse who received alert:  C. Chadley Dziedzic RN MD notified time and response:  Dr. Ellin Saba, will give 2 units of blood tomorrow as scheduled

## 2022-10-03 NOTE — Patient Instructions (Signed)
[facility] [place]  Discharge Instructions: Thank you for choosing [facility] to provide your oncology and hematology care.  If you have a lab appointment with the Cancer Center - please note that after April 8th, 2024, all labs will be drawn in the cancer center.  You do not have to check in or register with the main entrance as you have in the past but will complete your check-in in the cancer center.  Wear comfortable clothing and clothing appropriate for easy access to any Portacath or PICC line.   We strive to give you quality time with your provider. You may need to reschedule your appointment if you arrive late (15 or more minutes).  Arriving late affects you and other patients whose appointments are after yours.  Also, if you miss three or more appointments without notifying the office, you may be dismissed from the clinic at the provider's discretion.      For prescription refill requests, have your pharmacy contact our office and allow 72 hours for refills to be completed.    Today you received the following port flush with lab, return as scheduled.  To help prevent nausea and vomiting after your treatment, we encourage you to take your nausea medication as directed.  BELOW ARE SYMPTOMS THAT SHOULD BE REPORTED IMMEDIATELY: *FEVER GREATER THAN 100.4 F (38 C) OR HIGHER *CHILLS OR SWEATING *NAUSEA AND VOMITING THAT IS NOT CONTROLLED WITH YOUR NAUSEA MEDICATION *UNUSUAL SHORTNESS OF BREATH *UNUSUAL BRUISING OR BLEEDING *URINARY PROBLEMS (pain or burning when urinating, or frequent urination) *BOWEL PROBLEMS (unusual diarrhea, constipation, pain near the anus) TENDERNESS IN MOUTH AND THROAT WITH OR WITHOUT PRESENCE OF ULCERS (sore throat, sores in mouth, or a toothache) UNUSUAL RASH, SWELLING OR PAIN  UNUSUAL VAGINAL DISCHARGE OR ITCHING   Items with * indicate a potential emergency and should be followed up as soon as possible or go to the Emergency  Department if any problems should occur.  Please show the CHEMOTHERAPY ALERT CARD or IMMUNOTHERAPY ALERT CARD at check-in to the Emergency Department and triage nurse.  Should you have questions after your visit or need to cancel or reschedule your appointment, please contact [facility] [phone]  and follow the prompts.  Office hours are 8:00 a.m. to 4:30 p.m. Monday - Friday. Please note that voicemails left after 4:00 p.m. may not be returned until the following business day.  We are closed weekends and major holidays. You have access to a nurse at all times for urgent questions. Please call the main number to the clinic [phone] and follow the prompts.  For any non-urgent questions, you may also contact your provider using MyChart. We now offer e-Visits for anyone 18 and older to request care online for non-urgent symptoms. For details visit [email or web address].   Also download the MyChart app! Go to the app store, search "MyChart", open the app, select [facility], and log in with your MyChart username and password.   

## 2022-10-04 ENCOUNTER — Inpatient Hospital Stay: Payer: No Typology Code available for payment source

## 2022-10-04 DIAGNOSIS — Z171 Estrogen receptor negative status [ER-]: Secondary | ICD-10-CM

## 2022-10-04 DIAGNOSIS — Z5112 Encounter for antineoplastic immunotherapy: Secondary | ICD-10-CM | POA: Diagnosis not present

## 2022-10-04 LAB — TYPE AND SCREEN

## 2022-10-04 LAB — BPAM RBC
Blood Product Expiration Date: 202406262359
ISSUE DATE / TIME: 202405301010
ISSUE DATE / TIME: 202405301152

## 2022-10-04 MED ORDER — SODIUM CHLORIDE 0.9% FLUSH
10.0000 mL | INTRAVENOUS | Status: AC | PRN
  Administered 2022-10-04: 10 mL

## 2022-10-04 MED ORDER — HEPARIN SOD (PORK) LOCK FLUSH 100 UNIT/ML IV SOLN
500.0000 [IU] | Freq: Every day | INTRAVENOUS | Status: AC | PRN
  Administered 2022-10-04: 500 [IU]

## 2022-10-04 MED ORDER — SODIUM CHLORIDE 0.9% IV SOLUTION
250.0000 mL | Freq: Once | INTRAVENOUS | Status: AC
  Administered 2022-10-04: 250 mL via INTRAVENOUS

## 2022-10-04 MED ORDER — DIPHENHYDRAMINE HCL 25 MG PO CAPS
25.0000 mg | ORAL_CAPSULE | Freq: Once | ORAL | Status: AC
  Administered 2022-10-04: 25 mg via ORAL
  Filled 2022-10-04: qty 1

## 2022-10-04 NOTE — Progress Notes (Signed)
Patient presents today for 2 unit of blood per provider's order. Vital signs stable and pt voiced no new complaints at this time.  Pt took Tylenol at home prior to arrival.  2 units of blood given today per MD orders. Tolerated infusion without adverse affects. Vital signs stable. No complaints at this time. Discharged from clinic via wheelchair in stable condition. Alert and oriented x 3. F/U with [facility] as scheduled.

## 2022-10-04 NOTE — Patient Instructions (Signed)
[facility]  Discharge Instructions: Thank you for choosing [facility] to provide your oncology and hematology care.  If you have a lab appointment with the Cancer Center - please note that after April 8th, 2024, all labs will be drawn in the cancer center.  You do not have to check in or register with the main entrance as you have in the past but will complete your check-in in the cancer center.  Wear comfortable clothing and clothing appropriate for easy access to any Portacath or PICC line.   We strive to give you quality time with your provider. You may need to reschedule your appointment if you arrive late (15 or more minutes).  Arriving late affects you and other patients whose appointments are after yours.  Also, if you miss three or more appointments without notifying the office, you may be dismissed from the clinic at the provider's discretion.      For prescription refill requests, have your pharmacy contact our office and allow 72 hours for refills to be completed.    Today you received 2 units of blood.   BELOW ARE SYMPTOMS THAT SHOULD BE REPORTED IMMEDIATELY: *FEVER GREATER THAN 100.4 F (38 C) OR HIGHER *CHILLS OR SWEATING *NAUSEA AND VOMITING THAT IS NOT CONTROLLED WITH YOUR NAUSEA MEDICATION *UNUSUAL SHORTNESS OF BREATH *UNUSUAL BRUISING OR BLEEDING *URINARY PROBLEMS (pain or burning when urinating, or frequent urination) *BOWEL PROBLEMS (unusual diarrhea, constipation, pain near the anus) TENDERNESS IN MOUTH AND THROAT WITH OR WITHOUT PRESENCE OF ULCERS (sore throat, sores in mouth, or a toothache) UNUSUAL RASH, SWELLING OR PAIN  UNUSUAL VAGINAL DISCHARGE OR ITCHING   Items with * indicate a potential emergency and should be followed up as soon as possible or go to the Emergency Department if any problems should occur.  Please show the CHEMOTHERAPY ALERT CARD or IMMUNOTHERAPY ALERT CARD at check-in to the Emergency Department and triage  nurse.  Should you have questions after your visit or need to cancel or reschedule your appointment, please contact [facility] [phone]  and follow the prompts.  Office hours are 8:00 a.m. to 4:30 p.m. Monday - Friday. Please note that voicemails left after 4:00 p.m. may not be returned until the following business day.  We are closed weekends and major holidays. You have access to a nurse at all times for urgent questions. Please call the main number to the clinic [phone] and follow the prompts.  For any non-urgent questions, you may also contact your provider using MyChart. We now offer e-Visits for anyone 43 and older to request care online for non-urgent symptoms. For details visit [email or web address].   Also download the MyChart app! Go to the app store, search "MyChart", open the app, select [place], and log in with your MyChart username and password.

## 2022-10-05 ENCOUNTER — Other Ambulatory Visit: Payer: Self-pay | Admitting: Surgery

## 2022-10-05 DIAGNOSIS — C50911 Malignant neoplasm of unspecified site of right female breast: Secondary | ICD-10-CM

## 2022-10-05 LAB — BPAM RBC
Blood Product Expiration Date: 202406192359
Unit Type and Rh: 600
Unit Type and Rh: 600

## 2022-10-05 LAB — TYPE AND SCREEN
ABO/RH(D): A NEG
Unit division: 0
Unit division: 0

## 2022-10-06 DIAGNOSIS — C50919 Malignant neoplasm of unspecified site of unspecified female breast: Secondary | ICD-10-CM

## 2022-10-06 HISTORY — DX: Malignant neoplasm of unspecified site of unspecified female breast: C50.919

## 2022-10-08 ENCOUNTER — Other Ambulatory Visit: Payer: Self-pay | Admitting: *Deleted

## 2022-10-08 DIAGNOSIS — Z171 Estrogen receptor negative status [ER-]: Secondary | ICD-10-CM

## 2022-10-09 ENCOUNTER — Ambulatory Visit: Payer: No Typology Code available for payment source | Attending: Surgery | Admitting: Physical Therapy

## 2022-10-09 ENCOUNTER — Other Ambulatory Visit: Payer: Self-pay

## 2022-10-09 ENCOUNTER — Encounter: Payer: Self-pay | Admitting: Physical Therapy

## 2022-10-09 DIAGNOSIS — C50811 Malignant neoplasm of overlapping sites of right female breast: Secondary | ICD-10-CM | POA: Diagnosis not present

## 2022-10-09 DIAGNOSIS — R293 Abnormal posture: Secondary | ICD-10-CM | POA: Diagnosis not present

## 2022-10-09 DIAGNOSIS — Z171 Estrogen receptor negative status [ER-]: Secondary | ICD-10-CM | POA: Insufficient documentation

## 2022-10-09 NOTE — Therapy (Signed)
OUTPATIENT PHYSICAL THERAPY BREAST CANCER BASELINE EVALUATION   Patient Name: Marilyn Ford MRN: [id number] DOB:04-26-1961, 62 y.o., female Today's Date: 10/09/2022  END OF SESSION:  PT End of Session - 10/09/22 1145     Visit Number 1    Number of Visits 2    Date for PT Re-Evaluation 12/04/22    PT Start Time 1102    PT Stop Time 1143    PT Time Calculation (min) 41 min    Activity Tolerance Patient tolerated treatment well    Behavior During Therapy WFL for tasks assessed/performed             Past Medical History:  Diagnosis Date   Allergy    Anxiety    Breast cancer (HCC)    Diabetes mellitus without complication (HCC)    GERD (gastroesophageal reflux disease)    Heart murmur    as a child   History of kidney stones    Hypertension    Pneumonia    Stroke [facility])    Vaginal Pap smear, abnormal    Past Surgical History:  Procedure Laterality Date   CESAREAN SECTION     CHOLECYSTECTOMY     COLONOSCOPY WITH PROPOFOL N/A 07/19/2020   Procedure: COLONOSCOPY WITH PROPOFOL;  Surgeon: Dolores Frame, MD;  Location: [facility];  Service: Gastroenterology;  Laterality: N/A;  AM   POLYPECTOMY  07/19/2020   Procedure: POLYPECTOMY;  Surgeon: Dolores Frame, MD;  Location: [facility];  Service: Gastroenterology;;   PORTACATH PLACEMENT N/A 03/20/2022   Procedure: INSERTION PORT-A-CATH;  Surgeon: Abigail Miyamoto, MD;  Location: [facility];  Service: General;  Laterality: N/A;   Patient Active Problem List   Diagnosis Date Noted   Genetic testing 06/05/2022   Neutropenia, drug-induced (HCC) 05/28/2022   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) 03/05/2022   Encounter for screening fecal occult blood testing 03/01/2022   Routine cervical smear 03/01/2022   Axillary adenopathy 02/20/2022   Mass of lower outer quadrant of right breast 01/30/2022   Cat bite of index finger 09/12/2021   Gastroesophageal reflux disease  without esophagitis 10/20/2019   GAD (generalized anxiety disorder) 10/20/2019   Insomnia due to medical condition 10/20/2019   Hemiparesis affecting right side as late effect of cerebrovascular accident (CVA) (HCC) 04/22/2015   Diabetes mellitus type II, controlled (HCC) 12/23/2014   Hypertension 12/23/2014    PCP: Mechele Claude, MD  REFERRING PROVIDER: Abigail Miyamoto, MD  REFERRING DIAG: C50.811,Z17.1 (ICD-10-CM) - Malignant neoplasm of overlapping sites of right breast in female, estrogen receptor negative (HCC)   THERAPY DIAG:  Abnormal posture  Malignant neoplasm of overlapping sites of right breast in female, estrogen receptor negative (HCC)  Rationale for Evaluation and Treatment: Rehabilitation  ONSET DATE: 02/20/22  SUBJECTIVE:  SUBJECTIVE STATEMENT: Patient reports she is here today to be seen by her medical team for her newly diagnosed right breast cancer. I had a stroke in 2015 and it affected the R side and the chemo made it worse. I have had to have several blood transfusions for anemia.   PERTINENT HISTORY:  Patient was diagnosed on 02/20/22 with right grade 3. It measures 3.8 cm and is located in the inner lower quadrant. 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  2 nodes taken at biopsy - 1 was negative the other was positive. Had blood transfusion on 10/05/22. Plan is to undergo a R simple mastectomy and SLNB on 11/01/22. Hx of DVT and stroke  PATIENT GOALS:   reduce lymphedema risk and learn post op HEP.   PAIN:  Are you having pain? No  PRECAUTIONS: Active CA Other: prior stroke in 2015 affecting R side  HAND DOMINANCE: right  WEIGHT BEARING RESTRICTIONS: No  FALLS:  Has patient fallen in last 6  months? No  LIVING ENVIRONMENT: Patient lives with: husband and son Lives in: House/apartment Has following equipment at home: Single point cane, Counselling psychologist, Environmental consultant - 2 wheeled, shower chair, and bed side commode  OCCUPATION: disability since stroke on 2015  LEISURE: pt has not been able to recently due to difficulty walking after chemo and previous stroke  PRIOR LEVEL OF FUNCTION: Independent   OBJECTIVE:  COGNITION: Overall cognitive status: Within functional limits for tasks assessed    POSTURE:  Forward head and rounded shoulders posture  UPPER EXTREMITY AROM/PROM:  A/PROM RIGHT   eval   Shoulder extension 68  Shoulder flexion 156  Shoulder abduction 180  Shoulder internal rotation 67  Shoulder external rotation 84    (Blank rows = not tested)  A/PROM LEFT   eval  Shoulder extension 71  Shoulder flexion 155  Shoulder abduction 170  Shoulder internal rotation 58  Shoulder external rotation 93    (Blank rows = not tested)  CERVICAL AROM: All within normal limits:    Percent limited  Flexion WFL  Extension WFL  Right lateral flexion 25% limited  Left lateral flexion WFL  Right rotation WFL  Left rotation WFL    UPPER EXTREMITY STRENGTH: 5/5  LYMPHEDEMA ASSESSMENTS:   LANDMARK RIGHT   eval  10 cm proximal to olecranon process 24.2  Olecranon process 21.9  10 cm proximal to ulnar styloid process 19  Just proximal to ulnar styloid process 14.7  Across hand at thumb web space 17.7  At base of 2nd digit 6  (Blank rows = not tested)  LANDMARK LEFT   eval  10 cm proximal to olecranon process 25.2  Olecranon process 22  10 cm proximal to ulnar styloid process 18.7  Just proximal to ulnar styloid process 14.5  Across hand at thumb web space 18.5  At base of 2nd digit 5.9  (Blank rows = not tested)  L-DEX LYMPHEDEMA SCREENING:  The patient was assessed using the L-Dex machine today to produce a lymphedema index baseline score. The  patient will be reassessed on a regular basis (typically every 3 months) to obtain new L-Dex scores. If the score is > 6.5 points away from his/her baseline score indicating onset of subclinical lymphedema, it will be recommended to wear a compression garment for 4 weeks, 12 hours per day and then be reassessed. If the score continues to be > 6.5 points from  baseline at reassessment, we will initiate lymphedema treatment. Assessing in this manner has a 95% rate of preventing clinically significant lymphedema.   L-DEX FLOWSHEETS - 10/09/22 1100       L-DEX LYMPHEDEMA SCREENING   Measurement Type Unilateral    L-DEX MEASUREMENT EXTREMITY Upper Extremity    POSITION  Standing    DOMINANT SIDE Right    At Risk Side Right    BASELINE SCORE (UNILATERAL) -6.8              PATIENT EDUCATION:  Education details: Lymphedema risk reduction and post op shoulder/posture HEP Person educated: Patient Education method: Explanation, Demonstration, Handout Education comprehension: Patient verbalized understanding and returned demonstration  HOME EXERCISE PROGRAM: Patient was instructed today in a home exercise program today for post op shoulder range of motion. These included active assist shoulder flexion in sitting, scapular retraction, wall walking with shoulder abduction, and hands behind head external rotation.  She was encouraged to do these twice a day, holding 3 seconds and repeating 5 times when permitted by her physician.   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 She has had 2 nodes biopsied - 1 was positive. She has completed neoadjuvant chemo and will undergo a R mastectomy and SLNB on 11/01/22. She has a previous history of a stroke in 2015 affecting R side. Pt reports since chemo her R side has gotten weaker and she has been having difficulty walking. She would like to go back to therapy at PT clinic near her  home where she had previous therapy post stroke. She will benefit from a post op PT reassessment to determine needs and from L-Dex screens every 3 months for 2 years to detect subclinical lymphedema.  Pt will benefit from skilled therapeutic intervention to improve on the following deficits: Decreased knowledge of precautions, impaired UE functional use, pain, decreased ROM, postural dysfunction.   PT treatment/interventions: ADL/self-care home management, pt/family education, therapeutic exercise  REHAB POTENTIAL: Good  CLINICAL DECISION MAKING: Stable/uncomplicated  EVALUATION COMPLEXITY: Low   GOALS: Goals reviewed with patient? YES  LONG TERM GOALS: (STG=LTG)    Name Target Date Goal status  1 Pt will be able to verbalize understanding of pertinent lymphedema risk reduction practices relevant to her dx specifically related to skin care.  Baseline:  No knowledge 10/09/2022 Achieved at eval  2 Pt will be able to return demo and/or verbalize understanding of the post op HEP related to regaining shoulder ROM. Baseline:  No knowledge 10/09/2022 Achieved at eval  3 Pt will be able to verbalize understanding of the importance of attending the post op After Breast CA Class for further lymphedema risk reduction education and therapeutic exercise.  Baseline:  No knowledge 10/09/2022 Achieved at eval  4 Pt will demo she has regained full shoulder ROM and function post operatively compared to baselines.  Baseline: See objective measurements taken today. 12/04/22 NEW    PLAN:  PT FREQUENCY/DURATION: EVAL and 1 follow up appointment.   PLAN FOR NEXT SESSION: will reassess 3-4 weeks post op to determine needs.   Patient will follow up at outpatient cancer rehab 3-4 weeks following surgery.  If the patient requires physical therapy at that time, a specific plan will be dictated and sent to the referring physician for approval. The patient was educated today on appropriate basic range of motion  exercises to begin post operatively and the importance of attending the After Breast Cancer class following surgery.  Patient was educated today on lymphedema risk reduction practices as it pertains to recommendations that will benefit the patient immediately following surgery.  She verbalized good understanding.    Physical Therapy Information for After Breast Cancer Surgery/Treatment:  Lymphedema is a swelling condition that you may be at risk for in your arm if you have lymph nodes removed from the armpit area.  After a sentinel node biopsy, the risk is approximately 5-9% and is higher after an axillary node dissection.  There is treatment available for this condition and it is not life-threatening.  Contact your physician or physical therapist with concerns. You may begin the 4 shoulder/posture exercises (see additional sheet) when permitted by your physician (typically a week after surgery).  If you have drains, you may need to wait until those are removed before beginning range of motion exercises.  A general recommendation is to not lift your arms above shoulder height until drains are removed.  These exercises should be done to your tolerance and gently.  This is not a "no pain/no gain" type of recovery so listen to your body and stretch into the range of motion that you can tolerate, stopping if you have pain.  If you are having immediate reconstruction, ask your plastic surgeon about doing exercises as he or she may want you to wait. We encourage you to attend the free one time ABC (After Breast Cancer) class offered by [facility].  You will learn information related to lymphedema risk, prevention and treatment and additional exercises to regain mobility following surgery.  You can call [phone] for more information.  This is offered the 1st and 3rd Monday of each month.  You only attend the class one time. While undergoing any medical procedure or treatment, try to  avoid blood pressure being taken or needle sticks from occurring on the arm on the side of cancer.   This recommendation begins after surgery and continues for the rest of your life.  This may help reduce your risk of getting lymphedema (swelling in your arm). An excellent resource for those seeking information on lymphedema is the National Lymphedema Network's web site. It can be accessed at www.lymphnet.org If you notice swelling in your hand, arm or breast at any time following surgery (even if it is many years from now), please contact your doctor or physical therapist to discuss this.  Lymphedema can be treated at any time but it is easier for you if it is treated early on.  If you feel like your shoulder motion is not returning to normal in a reasonable amount of time, please contact your surgeon or physical therapist.  [facility] [phone]. [address], [address], [place] [address]  ABC CLASS After Breast Cancer Class  After Breast Cancer Class is a specially designed exercise class to assist you in a safe recover after having breast cancer surgery.  In this class you will learn how to get back to full function whether your drains were just removed or if you had surgery a month ago.  This one-time class is held the 1st and 3rd Monday of every month from 11:00 a.m. until 12:00 noon virtually.  This class is FREE and space is limited. For more information or to register for the next available class, call [phone].  Class Goals  Understand specific stretches to improve the flexibility of you chest and shoulder. Learn ways to safely strengthen your upper body and improve your  posture. Understand the warning signs of infection and why you may be at risk for an arm infection. Learn about Lymphedema and prevention.  ** You do not attend this class until after surgery.  Drains must be removed to participate  Patient was instructed today in a home  exercise program today for post op shoulder range of motion. These included active assist shoulder flexion in sitting, scapular retraction, wall walking with shoulder abduction, and hands behind head external rotation.  She was encouraged to do these twice a day, holding 3 seconds and repeating 5 times when permitted by her physician.    [facility], PT 10/09/2022, 11:50 AM

## 2022-10-22 ENCOUNTER — Ambulatory Visit (HOSPITAL_COMMUNITY): Payer: No Typology Code available for payment source

## 2022-10-24 ENCOUNTER — Other Ambulatory Visit: Payer: Self-pay

## 2022-10-24 ENCOUNTER — Encounter (HOSPITAL_BASED_OUTPATIENT_CLINIC_OR_DEPARTMENT_OTHER): Payer: Self-pay | Admitting: Surgery

## 2022-10-24 NOTE — Progress Notes (Signed)
   10/24/22 1610  PAT Phone Screen  Is the patient taking a GLP-1 receptor agonist? Yes  Has the patient been informed on holding medication? Yes  Do You Have Diabetes? Yes  Do You Have Hypertension? Yes  Have You Ever Been to the ER for Asthma? No  Have You Taken Oral Steroids in the Past 3 Months? No  Do you Take Phenteramine or any Other Diet Drugs? No  Recent  Lab Work, EKG, CXR? Yes  Where was this test performed? EKG 03/19/2022  Do you have a history of heart problems? No  Have you ever had tests on your heart? Yes  What cardiac tests were performed? Echo  What date/year were cardiac tests completed? Echo 2024 EF 60-65%  Results viewable: [facility] Media Tab  Any Recent Hospitalizations? No  Height 5\' 2"  (1.575 m)  Weight 57.6 kg  Pat Appointment Scheduled Yes

## 2022-10-26 DIAGNOSIS — C50911 Malignant neoplasm of unspecified site of right female breast: Secondary | ICD-10-CM | POA: Diagnosis not present

## 2022-10-28 NOTE — Progress Notes (Signed)
[facility] [address], [place] [address]    Clinic Day:  10/29/2022  Referring physician: Mechele Claude, MD  Patient Care Team: Mechele Claude, MD as PCP - General (Family Medicine) Mechele Claude, MD (Family Medicine) Danella Maiers, [facility] as Pharmacist (Family Medicine) Marguerita Merles, Reuel Boom, MD as Consulting Physician (Gastroenterology) Franky Macho, MD as Consulting Physician (General Surgery) Pershing Proud, RN as Oncology Nurse Navigator [name], RN as Oncology Nurse Navigator [name], MD as Medical Oncologist (Medical Oncology) Therese Sarah, RN as Oncology Nurse Navigator (Medical Oncology) [name], MD as Consulting Physician (Hematology) Mechele Claude, MD as Referring Physician (Family Medicine)   ASSESSMENT & PLAN:   Assessment: 1.  Inflammatory right breast cancer: - Bilateral diagnostic mammogram (02/20/2022): Suspicious right axillary lymphadenopathy.  Indeterminate intramammary lymph node in the right breast at 9:00.  New right breast skin and trabecular thickening, related to vascular congestion from enlarged lymph nodes in the right axilla. - Right axillary lymph node core biopsy (02/20/2022): Morphology and IHC compatible with primary breast cancer with differential diagnosis of urothelial carcinoma and primary lung carcinoma (squamous).  Grade 3.  ER 0%, PR 0%, HER2 2+, Ki-67 60%, HER2 negative by FISH. - Right breast lymph node biopsy at 9:00 (02/20/2022): Negative for carcinoma. - MRI breast (03/02/2022): In the upper outer right breast, mid to posterior depth there is a patchy clumped non-mass enhancement in a linear orientation spanning approximately 3.8 cm.  There is diffuse thickening of the skin in the right breast with skin enhancement.  Left breast with no mass or abnormal enhancement.  Numerous bulky matted lymph nodes in the right axilla. - Right breast UOQ biopsy (03/19/2022): Benign  breast with fibrocystic changes including stromal fibrosis, adenosis, usual ductal hyperplasia.  Negative for carcinoma. - PET scan (03/22/2022): Bulky hypermetabolic right axillary and subpectoral lymphadenopathy.  No other definite new sites of metastatic disease.  6 mm right lower lobe lung nodule with no FDG uptake. - Neoadjuvant chemotherapy with weekly paclitaxel and pembrolizumab from 04/02/2022 through 06/21/2022,  AC and pembrolizumab from 07/12/2022 through 09/27/2022    Plan: 1.  Triple negative right breast cancer: - She has tolerated last cycle of AC and pembrolizumab reasonably well. - She had intermittent diarrhea which is better now.  She had 1 episode of nausea and dry heaves this morning while she was coming to our office. - Reviewed her labs today: Normal LFTs.  CBC with hemoglobin 8.7 and platelet count 146. - She met with Dr. Magnus Ivan who is doing right mastectomy and lymph node biopsy on 11/01/2022. - Recommend follow-up in 4 weeks after surgery to discuss pathology and further plan.  I have recommended that she keep her port at this time.   2.  Hypomagnesemia: - She has stopped taking magnesium few days ago.  Magnesium level is 1.6 today.  Recommend restarting magnesium daily.   3.  Weight loss: - She is eating well and weight is stable.    Orders Placed This Encounter  Procedures   CBC with Differential    Standing Status:   Future    Standing Expiration Date:   10/29/2023   Comprehensive metabolic panel    Standing Status:   Future    Standing Expiration Date:   10/29/2023   Magnesium    Standing Status:   Future    Standing Expiration Date:   10/29/2023      I,Katie Daubenspeck,acting as a scribe for [name], MD.,have  documented all relevant documentation on the behalf of [name], MD,as directed by  [name], MD while in the presence of [name], MD.   I, [name] MD, have reviewed the above documentation  for accuracy and completeness, and I agree with the above.   [name], MD   6/24/202411:10 AM  CHIEF COMPLAINT:   Diagnosis: locally advanced TNBC    Cancer Staging  Malignant neoplasm of overlapping sites of right female breast [facility]) Staging form: Breast, AJCC 8th Edition - Clinical stage from 03/08/2022: Stage IIIC (cT4d, cN2, cM0, G3, ER-, PR-, HER2-) - Signed by Malachy Mood, MD on 03/08/2022    Prior Therapy: 1. carboplatin and paclitaxel, 4 cycles, 04/02/22 - 06/06/22 2. Pembrolizumab, 04/02/22 - 09/27/22 3. Adriamycin and Cytoxan, 4 cycles, 07/12/22 - 09/27/22  Current Therapy:  pending surgery   HISTORY OF PRESENT ILLNESS:   Oncology History Overview Note   Cancer Staging  Malignant neoplasm of overlapping sites of right female breast [facility]) Staging form: Breast, AJCC 8th Edition - Clinical stage from 03/08/2022: Stage IIIC (cT2, cN2, cM0, G3, ER-, PR-, HER2-) - Signed by Malachy Mood, MD on 03/08/2022    Malignant neoplasm of overlapping sites of right female breast [facility])  02/20/2022 Mammogram   CLINICAL DATA:  62 year old female recalled from screening mammography 03/01/2021 for right breast calcifications and subsequent benign discordant biopsy of these calcifications in the central posterior right breast December 2022 with excision recommended. The patient initially followed up with surgery in April however canceled her scheduled surgery and most recently followed up with Dr. Henreitta Leber September 2023 with diagnostic imaging, possible RF tag placement and subsequent excision recommended.   EXAM: DIGITAL DIAGNOSTIC BILATERAL MAMMOGRAM WITH TOMOSYNTHESIS; ULTRASOUND RIGHT BREAST LIMITED  MPRESSION: 1.  Suspicious right axillary lymphadenopathy.   2. Indeterminate intramammary lymph node in the right breast at 9 o'clock.   3. New right breast skin and trabecular thickening, possibly related to vascular congestion from enlarged lymph nodes in the right axilla  although most concerning for inflammatory breast cancer.   4. Decreased calcifications noted at prior benign biopsy site in the lower central posterior right breast at site of X shaped biopsy marking clip.   02/20/2022 Initial Biopsy   FINAL MICROSCOPIC DIAGNOSIS:   A. AXILLA, RIGHT, LYMPH NODE, NEEDLE CORE BIOPSY:  - Positive for carcinoma (see Comment)   B. LYMPH NODE, RIGHT BREAST, BIOPSY:  - Negative for carcinoma   COMMENT:  Part A: Morphology and immunohistochemical staining are most compatible with primary breast carcinoma with metaplastic changes, however differential diagnosis also includes urothelial carcinoma and less likely primary lung carcinoma (squamous).  No lymphoid tissue is identified.  Clinical and radiologic correlation is suggested.   ADDENDUM:  In case of a breast origin, the appropriate grade would be grade 3  (3+3+2)   ADDENDUM:  PROGNOSTIC INDICATOR RESULTS:  The tumor cells are EQUIVOCAL for Her2 (2+).  Estrogen Receptor:       0%, NEGATIVE  Progesterone Receptor:   0%, NEGATIVE  Proliferation Marker Ki-67:   60%   ADDENDUM:  FLOURESCENCE IN-SITU HYBRIDIZATION RESULTS:  GROUP 5:   HER2 **NEGATIVE**    03/02/2022 Imaging   EXAM: BILATERAL BREAST MRI WITH AND WITHOUT CONTRAST  IMPRESSION: 1. There is a suspicious 3.8 cm area of patchy non mass enhancement in a linear orientation in the slightly upper outer right breast in the mid to posterior depth spanning 3.8 cm.   2. Diffuse skin thickening with enhancement of the skin, concerning  for inflammatory breast cancer.   3. Numerous bulky matted lymph nodes in the right axilla, one of which corresponds with the biopsy-proven metastatic lymph node.   4.  No evidence of left breast malignancy.   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)   03/08/2022 Cancer Staging   Staging form: Breast, AJCC 8th Edition - Clinical stage from 03/08/2022:  Stage IIIC (cT4d, cN2, cM0, G3, ER-, PR-, HER2-) - Signed by Malachy Mood, MD on 03/08/2022 Histologic grading system: 3 grade system   03/19/2022 Pathology Results   Diagnosis Breast, right, needle core biopsy, upper outer quadrant, barbell clip BENIGN BREAST WITH FIBROCYSTIC CHANGES INCLUDING STROMAL FIBROSIS, ADENOSIS AND USUAL DUCT HYPERPLASIA BENIGN FIBROMATOID CHANGE NEGATIVE FOR MICROCALCIFICATIONS NEGATIVE FOR CARCINOMA   03/22/2022 PET scan   IMPRESSION: Bulky hypermetabolic right axillary and subpectoral lymphadenopathy, consistent with metastatic disease.   No other definite sites of metastatic disease identified.   6 mm right lower lobe pulmonary nodule shows no FDG uptake, but is too small to definitively characterize by PET. Recommend continued follow-up by chest CT in 3-4 months.   Aortic Atherosclerosis (ICD10-I70.0).   04/02/2022 -  Chemotherapy   Patient is on Treatment Plan : BREAST Pembrolizumab (200) D1 + Carboplatin (5) D1 + Paclitaxel (80) D1,8,15 q21d X 4 cycles / Pembrolizumab (200) D1 + AC D1 q21d x 4 cycles      Genetic Testing   Negative genetic testing. No pathogenic variants identified on the [facility] Common Hereditary Cancers+RNA panel. The report date is 05/31/2022.  The Common Hereditary Cancers Panel + RNA offered by [facility] includes sequencing and/or deletion duplication testing of the following 48 genes: APC*, ATM*, AXIN2, BAP1, BARD1, BMPR1A, BRCA1, BRCA2, BRIP1, CDH1, CDK4, CDKN2A (p14ARF), CDKN2A (p16INK4a), CHEK2, CTNNA1, DICER1*, EPCAM*, FH*, GREM1*, HOXB13, KIT, MBD4, MEN1*, MLH1*, MSH2*, MSH3*, MSH6*, MUTYH, NF1*, NTHL1, PALB2, PDGFRA, PMS2*, POLD1*, POLE, PTEN*, RAD51C, RAD51D, SDHA*, SDHB, SDHC*, SDHD, SMAD4, SMARCA4, STK11, TP53, TSC1*, TSC2, VHL.       INTERVAL HISTORY:   Marilyn Ford is a 62 y.o. female presenting to clinic today for follow up of locally advanced TNBC. She was last seen by me on 09/27/22.  Since her last visit, she saw Dr.  Magnus Ivan on 10/03/22 for follow up. She is scheduled for right mastectomy on 11/01/22.  Today, she states that she is doing well overall. Her appetite level is at 70%. Her energy level is at 70%.  PAST MEDICAL HISTORY:   Past Medical History: Past Medical History:  Diagnosis Date   Allergy    Anxiety    Breast cancer (HCC)    Diabetes mellitus without complication (HCC)    GERD (gastroesophageal reflux disease)    Heart murmur    as a child   History of kidney stones    Hypertension    Pneumonia    Stroke [facility])    Vaginal Pap smear, abnormal     Surgical History: Past Surgical History:  Procedure Laterality Date   CESAREAN SECTION     CHOLECYSTECTOMY     COLONOSCOPY WITH PROPOFOL N/A 07/19/2020   Procedure: COLONOSCOPY WITH PROPOFOL;  Surgeon: Dolores Frame, MD;  [facility];  Service: Gastroenterology;  Laterality: N/A;  AM   POLYPECTOMY  07/19/2020   Procedure: POLYPECTOMY;  Surgeon: Dolores Frame, MD;  Location: [facility];  Service: Gastroenterology;;   PORTACATH PLACEMENT N/A 03/20/2022   Procedure: INSERTION PORT-A-CATH;  Surgeon: Abigail Miyamoto, MD;  Location: [facility];  Service: General;  Laterality: N/A;    Social History: Social History   Socioeconomic History   Marital status: Married    Spouse name: Fayrene Fearing   Number of children: 2   Years of education: 10   Highest education level: 10th grade  Occupational History   Occupation: disabled  Tobacco Use   Smoking status: Former    Packs/day: 0.50    Years: 25.00    Additional pack years: 0.00    Total pack years: 12.50    Types: Cigarettes    Start date: 12/08/1999    Quit date: 03/12/2015    Years since quitting: 7.6   Smokeless tobacco: Never  Vaping Use   Vaping Use: Never used  Substance and Sexual Activity   Alcohol use: Yes    Alcohol/week: 2.0 - 3.0 standard drinks of alcohol    Types: 2 - 3 Glasses of wine per week   Drug use: No   Sexual activity: Not  Currently    Birth control/protection: Abstinence, Post-menopausal  Other Topics Concern   Not on file  Social History Narrative   Volunteers at [employer] for a few hours every day.    She really enjoys getting out of of the house and working there.    Social Determinants of Health   Financial Resource Strain: Low Risk  (09/08/2022)   Overall Financial Resource Strain (CARDIA)    Difficulty of Paying Living Expenses: Not very hard  Food Insecurity: No Food Insecurity (09/08/2022)   Hunger Vital Sign    Worried About Running Out of Food in the Last Year: Never true    Ran Out of Food in the Last Year: Never true  Transportation Needs: No Transportation Needs (09/08/2022)   PRAPARE - Administrator, Civil Service (Medical): No    Lack of Transportation (Non-Medical): No  Physical Activity: Inactive (09/08/2022)   Exercise Vital Sign    Days of Exercise per Week: 0 days    Minutes of Exercise per Session: 0 min  Stress: Stress Concern Present (09/08/2022)   [employer] of Occupational Health - Occupational Stress Questionnaire    Feeling of Stress : To some extent  Social Connections: Unknown (09/08/2022)   Social Connection and Isolation Panel [NHANES]    Frequency of Communication with Friends and Family: More than three times a week    Frequency of Social Gatherings with Friends and Family: Once a week    Attends Religious Services: Patient declined    Database administrator or Organizations: No    Attends Banker Meetings: Never    Marital Status: Married  Catering manager Violence: Not At Risk (09/04/2022)   Humiliation, Afraid, Rape, and Kick questionnaire    Fear of Current or Ex-Partner: No    Emotionally Abused: No    Physically Abused: No    Sexually Abused: No    Family History: Family History  Problem Relation Age of Onset   Diabetes Mother    Uterine cancer Mother 59 - 6   Diabetes Brother    Breast cancer Maternal Aunt         dx >50, d. from cancer   Cancer Maternal Grandmother        unk type, "back cancer?"    Current Medications:  Current Outpatient Medications:    Alcohol Swabs (B-D SINGLE USE SWABS REGULAR) PADS, Test BS daily and as needed Dx E11.9, Disp: 100 each, Rfl: 3   aluminum-magnesium hydroxide-simethicone (MAALOX)  200-200-20 MG/5ML SUSP, Take 30 mLs by mouth 4 (four) times daily -  before meals and at bedtime., Disp: 480 mL, Rfl: 2   amLODipine (NORVASC) 10 MG tablet, 1 tablet daily, Disp: 90 tablet, Rfl: 3   Apple Cider Vinegar 500 MG TABS, Take 500 mg by mouth in the morning., Disp: , Rfl:    aspirin 81 MG EC tablet, Take 1 tablet (81 mg total) by mouth daily., Disp: 30 tablet, Rfl: 0   atorvastatin (LIPITOR) 80 MG tablet, TAKE 1 TABLET EVERY DAY AT 6PM, Disp: 90 tablet, Rfl: 3   Blood Glucose Calibration (TRUE METRIX LEVEL 1) Low SOLN, Use with glucometer Dx E11.9, Disp: 3 each, Rfl: 0   Blood Glucose Monitoring Suppl (TRUE METRIX AIR GLUCOSE METER) w/Device KIT, Test BS daily and as needed Dx E11.9, Disp: 1 kit, Rfl: 0   Calcium Carb-Cholecalciferol (CALCIUM-VITAMIN D) 600-400 MG-UNIT TABS, Take 1 tablet by mouth in the morning., Disp: , Rfl:    Coenzyme Q10 (COQ10) 100 MG CAPS, Take 100 mg by mouth in the morning., Disp: , Rfl:    Cranberry 425 MG CAPS, Take 425 mg by mouth in the morning and at bedtime., Disp: , Rfl:    diphenoxylate-atropine (LOMOTIL) 2.5-0.025 MG tablet, Take 1 tablet by mouth 4 (four) times daily as needed for diarrhea or loose stools., Disp: 30 tablet, Rfl: 0   Dulaglutide (TRULICITY) 1.5 MG/0.5ML SOPN, Inject content of one pen under the skin weekly, Disp: 6 mL, Rfl: 3   Flaxseed, Linseed, (FLAXSEED OIL) 1000 MG CAPS, Take 1,000 mg by mouth in the morning., Disp: , Rfl:    glucose blood (TRUE METRIX BLOOD GLUCOSE TEST) test strip, Test BS daily and as needed Dx E11.9, Disp: 100 each, Rfl: 3   icosapent Ethyl (VASCEPA) 1 g capsule, Take 2 g by mouth 2 (two) times daily.,  Disp: , Rfl:    Krill Oil 500 MG CAPS, Take 3 capsules (1,500 mg total) by mouth in the morning and at bedtime., Disp: , Rfl:    lidocaine (XYLOCAINE) 2 % solution, Use as directed 15 mLs in the mouth or throat every 6 (six) hours as needed for mouth pain., Disp: 250 mL, Rfl: 1   lidocaine (XYLOCAINE) 5 % ointment, Apply 1 Application topically as needed., Disp: 35.44 g, Rfl: 0   lidocaine-prilocaine (EMLA) cream, Apply to affected area once, Disp: 30 g, Rfl: 3   LORazepam (ATIVAN) 1 MG tablet, Take 1 tablet (1 mg total) by mouth 2 (two) times daily as needed for anxiety., Disp: 10 tablet, Rfl: 0   magnesium oxide (MAG-OX) 400 (240 Mg) MG tablet, TAKE 1 TABLET BY MOUTH TWICE A DAY, Disp: 60 tablet, Rfl: 1   meloxicam (MOBIC) 15 MG tablet, TAKE 1 TABLET (15 MG TOTAL) BY MOUTH DAILY. FOR JOINT AND MUSCLE PAIN, Disp: 90 tablet, Rfl: 1   metoprolol (TOPROL-XL) 200 MG 24 hr tablet, TAKE 1 TABLET ONE TIME DAILY, WITH OR IMMEDIATELY FOLLOWING A MEAL, Disp: 90 tablet, Rfl: 3   Multiple Vitamin (MULTIVITAMIN) capsule, Take 1 capsule by mouth in the morning., Disp: , Rfl:    ondansetron (ZOFRAN) 8 MG tablet, Take 1 tablet (8 mg total) by mouth every 8 (eight) hours as needed for nausea or vomiting., Disp: 30 tablet, Rfl: 3   pantoprazole (PROTONIX) 40 MG tablet, TAKE 1 TABLET twice daily FOR STOMACH, Disp: 180 tablet, Rfl: 3   prochlorperazine (COMPAZINE) 10 MG tablet, Take 1 tablet (10 mg total) by mouth every 6 (  six) hours as needed for nausea or vomiting., Disp: 30 tablet, Rfl: 1   traZODone (DESYREL) 150 MG tablet, TAKE 1 OR 2 TABLETS AT BEDTIME FOR SLEEP, Disp: 180 tablet, Rfl: 3   TRUEplus Lancets 33G MISC, Test BS daily and as needed Dx E11.9, Disp: 100 each, Rfl: 3   valsartan (DIOVAN) 320 MG tablet, Take 1 tablet (320 mg total) by mouth daily. For blood pressure., Disp: 90 tablet, Rfl: 1   zinc gluconate 50 MG tablet, Take 50 mg by mouth daily., Disp: , Rfl:    Zinc Oxide 20 % PSTE, Apply 1  Application topically 3 (three) times daily., Disp: 57 g, Rfl: 1   Allergies: Allergies  Allergen Reactions   Penicillins Shortness Of Breath   Sulfa Antibiotics Rash    REVIEW OF SYSTEMS:   Review of Systems  Constitutional:  Negative for chills, fatigue and fever.  HENT:   Negative for lump/mass, mouth sores, nosebleeds, sore throat and trouble swallowing.   Eyes:  Negative for eye problems.  Respiratory:  Negative for cough and shortness of breath.   Cardiovascular:  Negative for chest pain, leg swelling and palpitations.  Gastrointestinal:  Positive for diarrhea, nausea and vomiting. Negative for abdominal pain and constipation.  Genitourinary:  Negative for bladder incontinence, difficulty urinating, dysuria, frequency, hematuria and nocturia.   Musculoskeletal:  Negative for arthralgias, back pain, flank pain, myalgias and neck pain.  Skin:  Negative for itching and rash.  Neurological:  Negative for dizziness, headaches and numbness.  Hematological:  Does not bruise/bleed easily.  Psychiatric/Behavioral:  Positive for sleep disturbance. Negative for depression and suicidal ideas. The patient is not nervous/anxious.   All other systems reviewed and are negative.    VITALS:   There were no vitals taken for this visit.  Wt Readings from Last 3 Encounters:  10/29/22 126 lb 6.4 oz (57.3 kg)  09/27/22 130 lb 6.4 oz (59.1 kg)  09/19/22 125 lb 12.8 oz (57.1 kg)    There is no height or weight on file to calculate BMI.  Performance status (ECOG): 1 - Symptomatic but completely ambulatory  PHYSICAL EXAM:   Physical Exam Vitals and nursing note reviewed. Exam conducted with a chaperone present.  Constitutional:      Appearance: Normal appearance.  Cardiovascular:     Rate and Rhythm: Normal rate and regular rhythm.     Pulses: Normal pulses.     Heart sounds: Normal heart sounds.  Pulmonary:     Effort: Pulmonary effort is normal.     Breath sounds: Normal breath  sounds.  Abdominal:     Palpations: Abdomen is soft. There is no hepatomegaly, splenomegaly or mass.     Tenderness: There is no abdominal tenderness.  Musculoskeletal:     Right lower leg: No edema.     Left lower leg: No edema.  Lymphadenopathy:     Cervical: No cervical adenopathy.     Right cervical: No superficial, deep or posterior cervical adenopathy.    Left cervical: No superficial, deep or posterior cervical adenopathy.     Upper Body:     Right upper body: No supraclavicular or axillary adenopathy.     Left upper body: No supraclavicular or axillary adenopathy.  Neurological:     General: No focal deficit present.     Mental Status: She is alert and oriented to person, place, and time.  Psychiatric:        Mood and Affect: Mood normal.  Behavior: Behavior normal.     LABS:      Latest Ref Rng & Units 10/29/2022    9:27 AM 10/03/2022    2:14 PM 09/27/2022    8:25 AM  CBC  WBC 4.0 - 10.5 K/uL 4.5  0.4  3.3   Hemoglobin 12.0 - 15.0 g/dL 8.7  6.2  7.8   Hematocrit 36.0 - 46.0 % 26.7  18.3  23.7   Platelets 150 - 400 K/uL 146  98  142       Latest Ref Rng & Units 10/29/2022    9:27 AM 09/27/2022    8:25 AM 09/19/2022    9:31 AM  CMP  Glucose 70 - 99 mg/dL 829  562  130   BUN 8 - 23 mg/dL 13  12  21    Creatinine 0.44 - 1.00 mg/dL 8.65  7.84  6.96   Sodium 135 - 145 mmol/L 135  136  136   Potassium 3.5 - 5.1 mmol/L 4.1  4.2  3.7   Chloride 98 - 111 mmol/L 104  100  100   CO2 22 - 32 mmol/L 25  25  27    Calcium 8.9 - 10.3 mg/dL 8.9  9.0  9.6   Total Protein 6.5 - 8.1 g/dL 6.6  6.1  6.6   Total Bilirubin 0.3 - 1.2 mg/dL 0.7  0.5  0.8   Alkaline Phos 38 - 126 U/L 113  94  109   AST 15 - 41 U/L 21  19  22    ALT 0 - 44 U/L 24  19  35      No results found for: "CEA1", "CEA" / No results found for: "CEA1", "CEA" No results found for: "PSA1" No results found for: "EXB284" No results found for: "CAN125"  No results found for: "TOTALPROTELP", "ALBUMINELP",  "A1GS", "A2GS", "BETS", "BETA2SER", "GAMS", "MSPIKE", "SPEI" No results found for: "TIBC", "FERRITIN", "IRONPCTSAT" No results found for: "LDH"   STUDIES:   No results found.

## 2022-10-29 ENCOUNTER — Inpatient Hospital Stay: Payer: No Typology Code available for payment source

## 2022-10-29 ENCOUNTER — Inpatient Hospital Stay: Payer: No Typology Code available for payment source | Attending: Hematology and Oncology | Admitting: Hematology

## 2022-10-29 VITALS — BP 116/70 | HR 78 | Temp 97.6°F | Resp 18 | Ht 62.0 in | Wt 126.4 lb

## 2022-10-29 DIAGNOSIS — Z171 Estrogen receptor negative status [ER-]: Secondary | ICD-10-CM | POA: Insufficient documentation

## 2022-10-29 DIAGNOSIS — Z87891 Personal history of nicotine dependence: Secondary | ICD-10-CM | POA: Insufficient documentation

## 2022-10-29 DIAGNOSIS — Z79899 Other long term (current) drug therapy: Secondary | ICD-10-CM | POA: Insufficient documentation

## 2022-10-29 DIAGNOSIS — C50811 Malignant neoplasm of overlapping sites of right female breast: Secondary | ICD-10-CM | POA: Diagnosis not present

## 2022-10-29 DIAGNOSIS — Z9011 Acquired absence of right breast and nipple: Secondary | ICD-10-CM | POA: Diagnosis not present

## 2022-10-29 DIAGNOSIS — Z7982 Long term (current) use of aspirin: Secondary | ICD-10-CM | POA: Insufficient documentation

## 2022-10-29 DIAGNOSIS — Z95828 Presence of other vascular implants and grafts: Secondary | ICD-10-CM

## 2022-10-29 LAB — COMPREHENSIVE METABOLIC PANEL
ALT: 24 U/L (ref 0–44)
AST: 21 U/L (ref 15–41)
Albumin: 3.5 g/dL (ref 3.5–5.0)
Alkaline Phosphatase: 113 U/L (ref 38–126)
Anion gap: 6 (ref 5–15)
BUN: 13 mg/dL (ref 8–23)
CO2: 25 mmol/L (ref 22–32)
Calcium: 8.9 mg/dL (ref 8.9–10.3)
Chloride: 104 mmol/L (ref 98–111)
Creatinine, Ser: 0.84 mg/dL (ref 0.44–1.00)
GFR, Estimated: >60 mL/min (ref >60)
Glucose, Bld: 112 mg/dL — ABNORMAL HIGH (ref 70–99)
Potassium: 4.1 mmol/L (ref 3.5–5.1)
Sodium: 135 mmol/L (ref 135–145)
Total Bilirubin: 0.7 mg/dL (ref 0.3–1.2)
Total Protein: 6.6 g/dL (ref 6.5–8.1)

## 2022-10-29 LAB — CBC WITH DIFFERENTIAL/PLATELET
Abs Immature Granulocytes: 0.02 10*3/uL (ref 0.00–0.07)
Basophils Absolute: 0 10*3/uL (ref 0.0–0.1)
Basophils Relative: 0 %
Eosinophils Absolute: 0 10*3/uL (ref 0.0–0.5)
Eosinophils Relative: 1 %
HCT: 26.7 % — ABNORMAL LOW (ref 36.0–46.0)
Hemoglobin: 8.7 g/dL — ABNORMAL LOW (ref 12.0–15.0)
Immature Granulocytes: 0 %
Lymphocytes Relative: 8 %
Lymphs Abs: 0.4 10*3/uL — ABNORMAL LOW (ref 0.7–4.0)
MCH: 33.6 pg (ref 26.0–34.0)
MCHC: 32.6 g/dL (ref 30.0–36.0)
MCV: 103.1 fL — ABNORMAL HIGH (ref 80.0–100.0)
Monocytes Absolute: 0.4 10*3/uL (ref 0.1–1.0)
Monocytes Relative: 10 %
Neutro Abs: 3.6 10*3/uL (ref 1.7–7.7)
Neutrophils Relative %: 81 %
Platelets: 146 10*3/uL — ABNORMAL LOW (ref 150–400)
RBC: 2.59 MIL/uL — ABNORMAL LOW (ref 3.87–5.11)
RDW: 20.9 % — ABNORMAL HIGH (ref 11.5–15.5)
WBC: 4.5 10*3/uL (ref 4.0–10.5)
nRBC: 0 % (ref 0.0–0.2)

## 2022-10-29 LAB — MAGNESIUM: Magnesium: 1.6 mg/dL — ABNORMAL LOW (ref 1.7–2.4)

## 2022-10-29 LAB — SAMPLE TO BLOOD BANK

## 2022-10-29 MED ORDER — SODIUM CHLORIDE 0.9% FLUSH
10.0000 mL | Freq: Once | INTRAVENOUS | Status: AC
  Administered 2022-10-29: 10 mL via INTRAVENOUS

## 2022-10-29 MED ORDER — HEPARIN SOD (PORK) LOCK FLUSH 100 UNIT/ML IV SOLN
500.0000 [IU] | Freq: Once | INTRAVENOUS | Status: AC
  Administered 2022-10-29: 500 [IU] via INTRAVENOUS

## 2022-10-29 NOTE — Progress Notes (Signed)
Port flushed with good blood return noted. No bruising or swelling at site. Bandaid applied and patient discharged in satisfactory condition. VVS stable with no signs or symptoms of distressed noted. 

## 2022-10-29 NOTE — Patient Instructions (Signed)
[facility] at [facility] Discharge Instructions   You were seen and examined today by Dr. Ellin Saba.  He reviewed the results of your lab work which are normal/stable.   Proceed with your surgery as scheduled.   Return as scheduled.    Thank you for choosing [facility] at [facility] to provide your oncology and hematology care.  To afford each patient quality time with our provider, please arrive at least 15 minutes before your scheduled appointment time.   If you have a lab appointment with the Cancer Center please come in thru the Main Entrance and check in at the main information desk.  You need to re-schedule your appointment should you arrive 10 or more minutes late.  We strive to give you quality time with our providers, and arriving late affects you and other patients whose appointments are after yours.  Also, if you no show three or more times for appointments you may be dismissed from the clinic at the providers discretion.     Again, thank you for choosing [facility].  Our hope is that these requests will decrease the amount of time that you wait before being seen by our physicians.       _____________________________________________________________  Should you have questions after your visit to [facility], please contact our office at [phone] and follow the prompts.  Our office hours are 8:00 a.m. and 4:30 p.m. Monday - Friday.  Please note that voicemails left after 4:00 p.m. may not be returned until the following business day.  We are closed weekends and major holidays.  You do have access to a nurse 24-7, just call the main number to the clinic [phone] and do not press any options, hold on the line and a nurse will answer the phone.    For prescription refill requests, have your pharmacy contact our office and allow 72 hours.    Due to Covid, you will need to wear a mask upon entering the  hospital. If you do not have a mask, a mask will be given to you at the Main Entrance upon arrival. For doctor visits, patients may have 1 support person age 59 or older with them. For treatment visits, patients can not have anyone with them due to social distancing guidelines and our immunocompromised population.

## 2022-10-31 ENCOUNTER — Ambulatory Visit
Admission: RE | Admit: 2022-10-31 | Discharge: 2022-10-31 | Disposition: A | Discharge status: Home / Self Care | Payer: No Typology Code available for payment source | Source: Ambulatory Visit | Attending: Surgery | Admitting: Surgery

## 2022-10-31 ENCOUNTER — Other Ambulatory Visit: Payer: Self-pay | Admitting: Surgery

## 2022-10-31 DIAGNOSIS — C50911 Malignant neoplasm of unspecified site of right female breast: Secondary | ICD-10-CM

## 2022-10-31 DIAGNOSIS — C773 Secondary and unspecified malignant neoplasm of axilla and upper limb lymph nodes: Secondary | ICD-10-CM | POA: Diagnosis not present

## 2022-10-31 HISTORY — PX: BREAST BIOPSY: SHX20

## 2022-10-31 NOTE — Progress Notes (Signed)
Reminder text sent to pt to come for ERAS and CHG

## 2022-10-31 NOTE — H&P (Signed)
PROVIDER: Wayne Both, MD  MRN: [id number] DOB: 02/19/1961  Interval History:  She has now completed chemotherapy for triple negative right breast cancer. Clinically she has had a really good response. Her medical oncologist is uncertain whether this was inflammatory breast cancer or not. She is doing well but reports she does need a transfusion. She is otherwise without complaints.  Previous hx:  She was referred urgently from the cancer center after the diagnosis of a right breast cancer which is suspected to be inflammatory in November. It was triple negative. Urgent Port-A-Cath insertion was recommended to start IV chemotherapy She has had lymph nodes in the axilla biopsied which are positive for malignancy. She has no previous problems with history regarding her breast. She started experiencing fullness in the inner lower quadrant of her right breast several months ago. She had presented with a palpable lymph node. Biopsy of the lymph node again was triple negative with a Ki-67 of 60%. She has had a previous stroke. She had no cardiopulmonary issues   Medical History: Past Medical History: Diagnosis Date Anxiety DVT (deep venous thrombosis) (CMS-HCC) GERD (gastroesophageal reflux disease) History of cancer History of stroke Hypertension  There is no problem list on file for this patient.  Past Surgical History: Procedure Laterality Date LAPAROSCOPIC CHOLECYSTECTOMY REPEAT CESAREAN SECTION   Allergies Allergen Reactions Penicillins Shortness Of Breath Sulfa (Sulfonamide Antibiotics) Rash  Current Outpatient Medications on File Prior to Visit Medication Sig Dispense Refill alcohol swabs (BD ALCOHOL SWABS) PadM Test BS daily and as needed Dx E11.9 amLODIPine (NORVASC) 10 MG tablet Take 1 tablet by mouth once daily atorvastatin (LIPITOR) 80 MG tablet TAKE 1 TABLET EVERY DAY AT 6PM clopidogreL (PLAVIX) 75 mg tablet Take 75 mg by mouth once daily krill oil 500 mg Cap  Take by mouth LORazepam (ATIVAN) 1 MG tablet Take one, one hour before your MRI meloxicam (MOBIC) 15 MG tablet Take by mouth metoprolol succinate (TOPROL-XL) 200 MG XL tablet TAKE 1 TABLET ONE TIME DAILY, WITH OR IMMEDIATELY FOLLOWING A MEAL pantoprazole (PROTONIX) 40 MG DR tablet TAKE 1 TABLET twice daily FOR STOMACH traZODone (DESYREL) 150 MG tablet TAKE 1 OR 2 TABLETS AT BEDTIME FOR SLEEP valsartan (DIOVAN) 320 MG tablet Take by mouth apple cider vinegar 500 mg Tab Take by mouth calcium carbonate-vit D3-min 600 mg calcium- 400 unit Tab Take 1 tablet by mouth once daily cranberry extract 425 mg Cap Take by mouth flaxseed oiL 1,000 mg Cap Take by mouth multivitamin capsule Take 1 capsule by mouth once daily  No current facility-administered medications on file prior to visit.  Family History Problem Relation Age of Onset Diabetes Brother   Social History  Tobacco Use Smoking Status Never Smokeless Tobacco Never   Social History  Socioeconomic History Marital status: Married Tobacco Use Smoking status: Never Smokeless tobacco: Never Substance and Sexual Activity Alcohol use: Not Currently Drug use: Never   Physical Examination:  Physical Exam  She appears well on exam  I Cannot palpate any axillary adenopathy now on the right side  She has had having improvement in the right breast mass as well as thickening of the skin of the breast. The nipple areolar complex is normal. No erythema of the breast  Assessment and Plan:   Diagnoses and all orders for this visit:  Malignant neoplasm of right breast in female, estrogen receptor positive, unspecified site of breast (CMS/HHS-HCC)    She has had an improvement in her breast cancer on the right side on physical  examination. She still require a mastectomy on the right side as well as a targeted lymph node dissection. I do not believe we need to get an MRI as we have already made this decision to proceed as such.  This would be a total mastectomy with a sentinel lymph node in the axilla for targeted dissection of the right axilla. I explained the surgical procedure to the patient and her husband. We discussed the risks which includes but is not limited to bleeding, infection, injury to surrounding structures, the need for drains postoperatively, chronic arm swelling the need for further procedures, cardiopulmonary issues, DVT, etc. She understands and wished to proceed with surgery which will be scheduled. I will also ask oncology whether or not the Port-A-Cath can be removed as well.

## 2022-10-31 NOTE — Progress Notes (Signed)
G2 given to pt (drink by 0600 DOS) and CHG soap. Each with written instruction. Pt verbalized understanding.     Enhanced Recovery after Surgery for Orthopedics Enhanced Recovery after Surgery is a protocol used to improve the stress on your body and your recovery after surgery.  Patient Instructions  The night before surgery:  No food after midnight. ONLY clear liquids after midnight  The day of surgery (if you do NOT have diabetes):  Drink ONE (1) Pre-Surgery Clear Ensure as directed.   This drink was given to you during your hospital  pre-op appointment visit. The pre-op nurse will instruct you on the time to drink the  Pre-Surgery Ensure depending on your surgery time. Finish the drink at the designated time by the pre-op nurse.  Nothing else to drink after completing the  Pre-Surgery Clear Ensure.  The day of surgery (if you have diabetes): Drink ONE (1) Gatorade 2 (G2) as directed. This drink was given to you during your hospital  pre-op appointment visit.  The pre-op nurse will instruct you on the time to drink the   Gatorade 2 (G2) depending on your surgery time. Color of the Gatorade may vary. Red is not allowed. Nothing else to drink after completing the  Gatorade 2 (G2).         If you have questions, please contact your surgeon's office.

## 2022-11-01 ENCOUNTER — Encounter (HOSPITAL_BASED_OUTPATIENT_CLINIC_OR_DEPARTMENT_OTHER)
Admission: RE | Disposition: A | Discharge status: Home / Self Care | Payer: Self-pay | Source: Ambulatory Visit | Attending: Surgery

## 2022-11-01 ENCOUNTER — Ambulatory Visit (HOSPITAL_BASED_OUTPATIENT_CLINIC_OR_DEPARTMENT_OTHER): Payer: No Typology Code available for payment source | Admitting: Anesthesiology

## 2022-11-01 ENCOUNTER — Ambulatory Visit (HOSPITAL_BASED_OUTPATIENT_CLINIC_OR_DEPARTMENT_OTHER)
Admission: RE | Admit: 2022-11-01 | Discharge: 2022-11-02 | Disposition: A | Discharge status: Home / Self Care | Payer: No Typology Code available for payment source | Source: Ambulatory Visit | Attending: Surgery | Admitting: Surgery

## 2022-11-01 ENCOUNTER — Other Ambulatory Visit: Payer: Self-pay

## 2022-11-01 ENCOUNTER — Ambulatory Visit
Admission: RE | Admit: 2022-11-01 | Discharge: 2022-11-01 | Disposition: A | Discharge status: Home / Self Care | Payer: No Typology Code available for payment source | Source: Ambulatory Visit | Attending: Surgery | Admitting: Surgery

## 2022-11-01 ENCOUNTER — Encounter (HOSPITAL_BASED_OUTPATIENT_CLINIC_OR_DEPARTMENT_OTHER): Payer: Self-pay | Admitting: Surgery

## 2022-11-01 DIAGNOSIS — I10 Essential (primary) hypertension: Secondary | ICD-10-CM | POA: Diagnosis not present

## 2022-11-01 DIAGNOSIS — Z09 Encounter for follow-up examination after completed treatment for conditions other than malignant neoplasm: Secondary | ICD-10-CM | POA: Insufficient documentation

## 2022-11-01 DIAGNOSIS — Z9011 Acquired absence of right breast and nipple: Secondary | ICD-10-CM

## 2022-11-01 DIAGNOSIS — Z79899 Other long term (current) drug therapy: Secondary | ICD-10-CM | POA: Diagnosis not present

## 2022-11-01 DIAGNOSIS — Z171 Estrogen receptor negative status [ER-]: Secondary | ICD-10-CM | POA: Insufficient documentation

## 2022-11-01 DIAGNOSIS — Z87891 Personal history of nicotine dependence: Secondary | ICD-10-CM | POA: Diagnosis not present

## 2022-11-01 DIAGNOSIS — D630 Anemia in neoplastic disease: Secondary | ICD-10-CM | POA: Diagnosis not present

## 2022-11-01 DIAGNOSIS — G8918 Other acute postprocedural pain: Secondary | ICD-10-CM | POA: Diagnosis not present

## 2022-11-01 DIAGNOSIS — E119 Type 2 diabetes mellitus without complications: Secondary | ICD-10-CM | POA: Insufficient documentation

## 2022-11-01 DIAGNOSIS — N6011 Diffuse cystic mastopathy of right breast: Secondary | ICD-10-CM | POA: Diagnosis not present

## 2022-11-01 DIAGNOSIS — C773 Secondary and unspecified malignant neoplasm of axilla and upper limb lymph nodes: Secondary | ICD-10-CM | POA: Diagnosis not present

## 2022-11-01 DIAGNOSIS — Z8673 Personal history of transient ischemic attack (TIA), and cerebral infarction without residual deficits: Secondary | ICD-10-CM | POA: Diagnosis not present

## 2022-11-01 DIAGNOSIS — C50911 Malignant neoplasm of unspecified site of right female breast: Secondary | ICD-10-CM

## 2022-11-01 DIAGNOSIS — C50811 Malignant neoplasm of overlapping sites of right female breast: Secondary | ICD-10-CM | POA: Diagnosis not present

## 2022-11-01 DIAGNOSIS — C50311 Malignant neoplasm of lower-inner quadrant of right female breast: Secondary | ICD-10-CM | POA: Diagnosis not present

## 2022-11-01 HISTORY — PX: RADIOACTIVE SEED GUIDED AXILLARY SENTINEL LYMPH NODE: SHX6735

## 2022-11-01 HISTORY — PX: SIMPLE MASTECTOMY WITH AXILLARY SENTINEL NODE BIOPSY: SHX6098

## 2022-11-01 LAB — GLUCOSE, CAPILLARY
Glucose-Capillary: 88 mg/dL (ref 70–99)
Glucose-Capillary: 111 mg/dL — ABNORMAL HIGH (ref 70–99)
Glucose-Capillary: 194 mg/dL — ABNORMAL HIGH (ref 70–99)
Glucose-Capillary: 158 mg/dL — ABNORMAL HIGH (ref 70–99)

## 2022-11-01 SURGERY — SIMPLE MASTECTOMY
Anesthesia: General | Site: Breast | Laterality: Right

## 2022-11-01 MED ORDER — CHLORHEXIDINE GLUCONATE CLOTH 2 % EX PADS: 6.0000 | MEDICATED_PAD | Freq: Once | CUTANEOUS | Status: DC

## 2022-11-01 MED ORDER — EPHEDRINE SULFATE (PRESSORS) 50 MG/ML IJ SOLN
INTRAMUSCULAR | Status: DC | PRN
  Administered 2022-11-01 (×2): 10 mg via INTRAVENOUS

## 2022-11-01 MED ORDER — LIDOCAINE 2% (20 MG/ML) 5 ML SYRINGE
INTRAMUSCULAR | Status: AC
  Filled 2022-11-01: qty 5

## 2022-11-01 MED ORDER — ONDANSETRON 4 MG PO TBDP: 4.0000 mg | ORAL_TABLET | Freq: Four times a day (QID) | ORAL | Status: DC | PRN

## 2022-11-01 MED ORDER — ACETAMINOPHEN 500 MG PO TABS
ORAL_TABLET | ORAL | Status: AC
  Filled 2022-11-01: qty 2

## 2022-11-01 MED ORDER — HYDROMORPHONE HCL 1 MG/ML IJ SOLN: 1.0000 mg | INTRAMUSCULAR | Status: DC | PRN

## 2022-11-01 MED ORDER — ATORVASTATIN CALCIUM 80 MG PO TABS
80.0000 mg | ORAL_TABLET | Freq: Every day | ORAL | Status: DC
  Administered 2022-11-01: 80 mg via ORAL
  Filled 2022-11-01: qty 1

## 2022-11-01 MED ORDER — MIDAZOLAM HCL 2 MG/2ML IJ SOLN
INTRAMUSCULAR | Status: AC
  Filled 2022-11-01: qty 2

## 2022-11-01 MED ORDER — FENTANYL CITRATE (PF) 100 MCG/2ML IJ SOLN: 25.0000 ug | INTRAMUSCULAR | Status: DC | PRN

## 2022-11-01 MED ORDER — TRANEXAMIC ACID 1000 MG/10ML IV SOLN
Status: DC | PRN
  Administered 2022-11-01: 3000 mg via TOPICAL

## 2022-11-01 MED ORDER — HYDROMORPHONE HCL 1 MG/ML IJ SOLN
INTRAMUSCULAR | Status: AC
  Filled 2022-11-01: qty 1

## 2022-11-01 MED ORDER — ONDANSETRON HCL 4 MG/2ML IJ SOLN: 4.0000 mg | Freq: Four times a day (QID) | INTRAMUSCULAR | Status: DC | PRN

## 2022-11-01 MED ORDER — CIPROFLOXACIN IN D5W 400 MG/200ML IV SOLN
INTRAVENOUS | Status: AC
  Filled 2022-11-01: qty 200

## 2022-11-01 MED ORDER — AMLODIPINE BESYLATE 10 MG PO TABS
10.0000 mg | ORAL_TABLET | Freq: Every day | ORAL | Status: DC
  Filled 2022-11-01: qty 1

## 2022-11-01 MED ORDER — ACETAMINOPHEN 500 MG PO TABS
1000.0000 mg | ORAL_TABLET | Freq: Four times a day (QID) | ORAL | Status: DC
  Administered 2022-11-01 – 2022-11-02 (×3): 1000 mg via ORAL
  Filled 2022-11-01 (×4): qty 2

## 2022-11-01 MED ORDER — AMISULPRIDE (ANTIEMETIC) 5 MG/2ML IV SOLN: 10.0000 mg | Freq: Once | INTRAVENOUS | Status: DC | PRN

## 2022-11-01 MED ORDER — INSULIN ASPART 100 UNIT/ML IJ SOLN: 0.0000 [IU] | Freq: Every day | INTRAMUSCULAR | Status: DC

## 2022-11-01 MED ORDER — INSULIN ASPART 100 UNIT/ML IJ SOLN
0.0000 [IU] | Freq: Three times a day (TID) | INTRAMUSCULAR | Status: DC
  Administered 2022-11-01: 3 [IU] via SUBCUTANEOUS
  Filled 2022-11-01: qty 1

## 2022-11-01 MED ORDER — TRAZODONE HCL 150 MG PO TABS
150.0000 mg | ORAL_TABLET | Freq: Every evening | ORAL | Status: DC | PRN
  Administered 2022-11-01: 75 mg via ORAL
  Filled 2022-11-01: qty 1

## 2022-11-01 MED ORDER — ONDANSETRON HCL 4 MG/2ML IJ SOLN
INTRAMUSCULAR | Status: AC
  Filled 2022-11-01: qty 2

## 2022-11-01 MED ORDER — TRANEXAMIC ACID 1000 MG/10ML IV SOLN
INTRAVENOUS | Status: AC
  Filled 2022-11-01: qty 30

## 2022-11-01 MED ORDER — PROPOFOL 10 MG/ML IV BOLUS
INTRAVENOUS | Status: DC | PRN
  Administered 2022-11-01: 100 mg via INTRAVENOUS

## 2022-11-01 MED ORDER — METOPROLOL SUCCINATE ER 100 MG PO TB24
200.0000 mg | ORAL_TABLET | Freq: Every day | ORAL | Status: DC
  Filled 2022-11-01: qty 2
  Filled 2022-11-01: qty 1

## 2022-11-01 MED ORDER — COQ10 100 MG PO CAPS: 100.0000 mg | ORAL_CAPSULE | Freq: Every morning | ORAL | Status: DC

## 2022-11-01 MED ORDER — DEXAMETHASONE SODIUM PHOSPHATE 4 MG/ML IJ SOLN
INTRAMUSCULAR | Status: DC | PRN
  Administered 2022-11-01: 5 mg via INTRAVENOUS

## 2022-11-01 MED ORDER — DIPHENHYDRAMINE HCL 12.5 MG/5ML PO ELIX: 12.5000 mg | ORAL_SOLUTION | Freq: Four times a day (QID) | ORAL | Status: DC | PRN

## 2022-11-01 MED ORDER — DIPHENHYDRAMINE HCL 50 MG/ML IJ SOLN: 12.5000 mg | Freq: Four times a day (QID) | INTRAMUSCULAR | Status: DC | PRN

## 2022-11-01 MED ORDER — CIPROFLOXACIN IN D5W 400 MG/200ML IV SOLN
400.0000 mg | INTRAVENOUS | Status: AC
  Administered 2022-11-01: 400 mg via INTRAVENOUS

## 2022-11-01 MED ORDER — EPHEDRINE 5 MG/ML INJ
INTRAVENOUS | Status: AC
  Filled 2022-11-01: qty 5

## 2022-11-01 MED ORDER — ENSURE PRE-SURGERY PO LIQD: 296.0000 mL | Freq: Once | ORAL | Status: DC

## 2022-11-01 MED ORDER — METHOCARBAMOL 500 MG PO TABS: 500.0000 mg | ORAL_TABLET | Freq: Four times a day (QID) | ORAL | Status: DC | PRN

## 2022-11-01 MED ORDER — LORAZEPAM 1 MG PO TABS: 1.0000 mg | ORAL_TABLET | Freq: Two times a day (BID) | ORAL | Status: DC | PRN

## 2022-11-01 MED ORDER — SODIUM CHLORIDE 0.9 % IV SOLN: INTRAVENOUS | Status: DC

## 2022-11-01 MED ORDER — FENTANYL CITRATE (PF) 100 MCG/2ML IJ SOLN
INTRAMUSCULAR | Status: DC | PRN
  Administered 2022-11-01 (×2): 50 ug via INTRAVENOUS

## 2022-11-01 MED ORDER — FENTANYL CITRATE (PF) 100 MCG/2ML IJ SOLN
INTRAMUSCULAR | Status: AC
  Filled 2022-11-01: qty 2

## 2022-11-01 MED ORDER — TRAMADOL HCL 50 MG PO TABS: 50.0000 mg | ORAL_TABLET | Freq: Four times a day (QID) | ORAL | Status: DC | PRN

## 2022-11-01 MED ORDER — ACETAMINOPHEN 500 MG PO TABS
1000.0000 mg | ORAL_TABLET | ORAL | Status: AC
  Administered 2022-11-01: 1000 mg via ORAL

## 2022-11-01 MED ORDER — SUCCINYLCHOLINE CHLORIDE 200 MG/10ML IV SOSY
PREFILLED_SYRINGE | INTRAVENOUS | Status: AC
  Filled 2022-11-01: qty 10

## 2022-11-01 MED ORDER — PANTOPRAZOLE SODIUM 40 MG PO TBEC
40.0000 mg | DELAYED_RELEASE_TABLET | Freq: Two times a day (BID) | ORAL | Status: DC
  Administered 2022-11-01: 40 mg via ORAL
  Filled 2022-11-01: qty 1

## 2022-11-01 MED ORDER — MAGTRACE LYMPHATIC TRACER
INTRAMUSCULAR | Status: DC | PRN
  Administered 2022-11-01: 2 mL via INTRAMUSCULAR

## 2022-11-01 MED ORDER — BUPIVACAINE HCL (PF) 0.25 % IJ SOLN
INTRAMUSCULAR | Status: DC | PRN
  Administered 2022-11-01: 30 mL via PERINEURAL

## 2022-11-01 MED ORDER — ONDANSETRON HCL 4 MG/2ML IJ SOLN
INTRAMUSCULAR | Status: DC | PRN
  Administered 2022-11-01: 4 mg via INTRAVENOUS

## 2022-11-01 MED ORDER — LIDOCAINE HCL (CARDIAC) PF 100 MG/5ML IV SOSY
PREFILLED_SYRINGE | INTRAVENOUS | Status: DC | PRN
  Administered 2022-11-01: 60 mg via INTRAVENOUS

## 2022-11-01 MED ORDER — PHENYLEPHRINE 80 MCG/ML (10ML) SYRINGE FOR IV PUSH (FOR BLOOD PRESSURE SUPPORT)
PREFILLED_SYRINGE | INTRAVENOUS | Status: AC
  Filled 2022-11-01: qty 10

## 2022-11-01 MED ORDER — OXYCODONE HCL 5 MG PO TABS: 5.0000 mg | ORAL_TABLET | Freq: Once | ORAL | Status: DC | PRN

## 2022-11-01 MED ORDER — ENOXAPARIN SODIUM 40 MG/0.4ML IJ SOSY
40.0000 mg | PREFILLED_SYRINGE | INTRAMUSCULAR | Status: DC
  Administered 2022-11-02: 40 mg via SUBCUTANEOUS
  Filled 2022-11-01: qty 0.4

## 2022-11-01 MED ORDER — IRBESARTAN 300 MG PO TABS
300.0000 mg | ORAL_TABLET | Freq: Every day | ORAL | Status: DC
  Filled 2022-11-01: qty 1

## 2022-11-01 MED ORDER — LACTATED RINGERS IV SOLN: INTRAVENOUS | Status: DC

## 2022-11-01 MED ORDER — OXYCODONE HCL 5 MG/5ML PO SOLN: 5.0000 mg | Freq: Once | ORAL | Status: DC | PRN

## 2022-11-01 MED ORDER — OXYCODONE HCL 5 MG PO TABS: 5.0000 mg | ORAL_TABLET | ORAL | Status: DC | PRN

## 2022-11-01 MED ORDER — DEXAMETHASONE SODIUM PHOSPHATE 10 MG/ML IJ SOLN
INTRAMUSCULAR | Status: AC
  Filled 2022-11-01: qty 1

## 2022-11-01 MED ORDER — MIDAZOLAM HCL 2 MG/2ML IJ SOLN
2.0000 mg | Freq: Once | INTRAMUSCULAR | Status: AC
  Administered 2022-11-01: 2 mg via INTRAVENOUS

## 2022-11-01 MED ORDER — ATROPINE SULFATE 0.4 MG/ML IV SOLN
INTRAVENOUS | Status: AC
  Filled 2022-11-01: qty 1

## 2022-11-01 SURGICAL SUPPLY — 51 items
ADH SKN CLS APL DERMABOND .7 (GAUZE/BANDAGES/DRESSINGS) ×2
APL PRP STRL LF DISP 70% ISPRP (MISCELLANEOUS) ×2
APPLIER CLIP 9.375 MED OPEN (MISCELLANEOUS) ×2
APR CLP MED 9.3 20 MLT OPN (MISCELLANEOUS) ×2
BIOPATCH RED 1 DISK 7.0 (GAUZE/BANDAGES/DRESSINGS) ×2 IMPLANT
BLADE CLIPPER SURG (BLADE) IMPLANT
BLADE SURG 15 STRL LF DISP TIS (BLADE) ×2 IMPLANT
BLADE SURG 15 STRL SS (BLADE) ×2
CANISTER SUCT 1200ML W/VALVE (MISCELLANEOUS) ×2 IMPLANT
CHLORAPREP W/TINT 26 (MISCELLANEOUS) ×2 IMPLANT
CLIP APPLIE 9.375 MED OPEN (MISCELLANEOUS) ×2 IMPLANT
COVER BACK TABLE 60X90IN (DRAPES) ×2 IMPLANT
COVER MAYO STAND STRL (DRAPES) ×2 IMPLANT
COVER PROBE W GEL 5X96 (DRAPES) IMPLANT
DERMABOND ADVANCED .7 DNX12 (GAUZE/BANDAGES/DRESSINGS) ×2 IMPLANT
DRAIN CHANNEL 19F RND (DRAIN) ×2 IMPLANT
DRAPE LAPAROSCOPIC ABDOMINAL (DRAPES) ×2 IMPLANT
DRAPE UTILITY XL STRL (DRAPES) ×2 IMPLANT
DRSG TEGADERM 2-3/8X2-3/4 SM (GAUZE/BANDAGES/DRESSINGS) ×2 IMPLANT
ELECT REM PT RETURN 9FT ADLT (ELECTROSURGICAL) ×2
ELECTRODE REM PT RTRN 9FT ADLT (ELECTROSURGICAL) ×2 IMPLANT
EVACUATOR SILICONE 100CC (DRAIN) ×2 IMPLANT
GAUZE SPONGE 4X4 12PLY STRL LF (GAUZE/BANDAGES/DRESSINGS) IMPLANT
GLOVE SURG SIGNA 7.5 PF LTX (GLOVE) ×2 IMPLANT
GOWN STRL REUS W/ TWL LRG LVL3 (GOWN DISPOSABLE) ×2 IMPLANT
GOWN STRL REUS W/ TWL XL LVL3 (GOWN DISPOSABLE) ×2 IMPLANT
GOWN STRL REUS W/TWL LRG LVL3 (GOWN DISPOSABLE) ×2
GOWN STRL REUS W/TWL XL LVL3 (GOWN DISPOSABLE) ×2
HEMOSTAT SURGICEL 2X14 (HEMOSTASIS) IMPLANT
NDL HYPO 25X1 1.5 SAFETY (NEEDLE) IMPLANT
NDL SAFETY ECLIP 18X1.5 (MISCELLANEOUS) IMPLANT
NEEDLE HYPO 25X1 1.5 SAFETY (NEEDLE) ×2 IMPLANT
NS IRRIG 1000ML POUR BTL (IV SOLUTION) ×2 IMPLANT
PACK BASIN DAY SURGERY FS (CUSTOM PROCEDURE TRAY) ×2 IMPLANT
PENCIL SMOKE EVACUATOR (MISCELLANEOUS) ×2 IMPLANT
PIN SAFETY STERILE (MISCELLANEOUS) ×2 IMPLANT
SLEEVE SCD COMPRESS KNEE MED (STOCKING) ×2 IMPLANT
SPIKE FLUID TRANSFER (MISCELLANEOUS) IMPLANT
SPONGE T-LAP 18X18 ~~LOC~~+RFID (SPONGE) ×2 IMPLANT
SUT ETHILON 2 0 FS 18 (SUTURE) ×2 IMPLANT
SUT MNCRL AB 4-0 PS2 18 (SUTURE) ×4 IMPLANT
SUT SILK 2 0 SH (SUTURE) ×2 IMPLANT
SUT VIC AB 3-0 SH 27 (SUTURE) ×4
SUT VIC AB 3-0 SH 27X BRD (SUTURE) ×4 IMPLANT
SUT VICRYL 3-0 CR8 SH (SUTURE) ×2 IMPLANT
SYR BULB EAR ULCER 3OZ GRN STR (SYRINGE) IMPLANT
SYR CONTROL 10ML LL (SYRINGE) IMPLANT
TOWEL GREEN STERILE FF (TOWEL DISPOSABLE) ×2 IMPLANT
TRACER MAGTRACE VIAL (MISCELLANEOUS) IMPLANT
TUBE CONNECTING 20X1/4 (TUBING) ×2 IMPLANT
YANKAUER SUCT BULB TIP NO VENT (SUCTIONS) ×2 IMPLANT

## 2022-11-01 NOTE — Interval H&P Note (Signed)
History and Physical Interval Note:no change in H and p  11/01/2022 8:11 AM  Marilyn Ford  has presented today for surgery, with the diagnosis of RIGHT BREAST CANCER.  The various methods of treatment have been discussed with the patient and family. After consideration of risks, benefits and other options for treatment, the patient has consented to  Procedure(s): RIGHT SIMPLE MASTECTOMY (Right) RADIOACTIVE SEED GUIDED RIGHT AXILLARY SENTINEL LYMPH NODE DISSECTION (Right) as a surgical intervention.  The patient's history has been reviewed, patient examined, no change in status, stable for surgery.  I have reviewed the patient's chart and labs.  Questions were answered to the patient's satisfaction.     Abigail Miyamoto

## 2022-11-01 NOTE — Anesthesia Procedure Notes (Signed)
Procedure Name: LMA Insertion Date/Time: 11/01/2022 9:21 AM  Performed by: Ronnette Hila, CRNAPre-anesthesia Checklist: Patient identified, Emergency Drugs available, Suction available and Patient being monitored Patient Re-evaluated:Patient Re-evaluated prior to induction Oxygen Delivery Method: Circle system utilized Preoxygenation: Pre-oxygenation with 100% oxygen Induction Type: IV induction Ventilation: Mask ventilation without difficulty LMA: LMA inserted LMA Size: 4.0 Number of attempts: 1 Airway Equipment and Method: Bite block Placement Confirmation: positive ETCO2 Tube secured with: Tape Dental Injury: Teeth and Oropharynx as per pre-operative assessment

## 2022-11-01 NOTE — Anesthesia Procedure Notes (Signed)
Anesthesia Regional Block: Pectoralis block   Pre-Anesthetic Checklist: , timeout performed,  Correct Patient, Correct Site, Correct Laterality,  Correct Procedure, Correct Position, site marked,  Risks and benefits discussed,  Surgical consent,  Pre-op evaluation,  At surgeon's request and post-op pain management  Laterality: Right  Prep: chloraprep       Needles:  Injection technique: Single-shot  Needle Type: Echogenic Stimulator Needle     Needle Length: 9cm  Needle Gauge: 21     Additional Needles:   Procedures:,,,, ultrasound used (permanent image in chart),,    Narrative:  Start time: 11/01/2022 8:40 AM End time: 11/01/2022 8:43 AM Injection made incrementally with aspirations every 5 mL.  Performed by: Personally  Anesthesiologist: Linton Rump, MD  Additional Notes: Discussed risks and benefits of nerve block including, but not limited to, prolonged and/or permanent nerve injury involving sensory and/or motor function. Monitors were applied and a time-out was performed. The nerve and associated structures were visualized under ultrasound guidance. After negative aspiration, local anesthetic was slowly injected around the nerve. There was no evidence of high pressure during the procedure. There were no paresthesias. VSS remained stable and the patient tolerated the procedure well.

## 2022-11-01 NOTE — Anesthesia Postprocedure Evaluation (Signed)
Anesthesia Post Note  Patient: TAIJA MATHIAS  Procedure(s) Performed: RIGHT SIMPLE MASTECTOMY (Right: Breast) RADIOACTIVE SEED GUIDED RIGHT AXILLARY SENTINEL LYMPH NODE DISSECTION (Right: Axilla)     Patient location during evaluation: PACU Anesthesia Type: General Level of consciousness: awake Pain management: pain level controlled Vital Signs Assessment: post-procedure vital signs reviewed and stable Respiratory status: spontaneous breathing, nonlabored ventilation and respiratory function stable Cardiovascular status: blood pressure returned to baseline and stable Postop Assessment: no apparent nausea or vomiting Anesthetic complications: no   No notable events documented.  Last Vitals:  Vitals:   11/01/22 1049 11/01/22 1100  BP: 122/60 117/62  Pulse: 74 72  Resp: (!) 9 13  Temp: 36.5 C   SpO2: 99% 100%    Last Pain:  Vitals:   11/01/22 1049  TempSrc:   PainSc: 2                  Linton Rump

## 2022-11-01 NOTE — Op Note (Signed)
   ALEXX GIAMBRA 11/01/2022   Pre-op Diagnosis: RIGHT BREAST CANCER     Post-op Diagnosis: same  Procedure(s): RIGHT SIMPLE MASTECTOMY RADIOACTIVE SEED GUIDED DEEP RIGHT AXILLARY TARGETED LYMPH NODE DISSECTION  Surgeon(s): Abigail Miyamoto, MD  Anesthesia: General  Staff:  Circulator: Raliegh Scarlet, RN Scrub Person: Maryan Rued, RN  Estimated Blood Loss: Minimal               Specimens: sent to path  Indications: This is a 62 year old female who was diagnosed with right breast cancer late last year.  It was node positive.  The decision was made to place a Port-A-Cath and proceed with neoadjuvant hemotherapy.  She has now had an excellent response to chemotherapy so the decision has been made to proceed with a right mastectomy and targeted lymph node dissection  Procedure: The patient was brought to the operating room and identified the correct patient.  She was placed upon the operating table and general anesthesia was induced.  I injected mag trace underneath the right nipple areolar complex and massaged the breast.  Her right breast and axilla were then prepped and draped in usual sterile fashion.  I made an elliptical incision transversely across the right breast incorporating the nipple areolar complex.  I then dissected down to the breast tissue circumferentially with electrocautery.  I dissected the superior skin flap staying underneath the skin and dermis going toward the level of the upper chest.  I took the dissection medially toward the sternum.  I then dissected the inferior skin flap going down to the inframammary ridge.  I then took the dissection toward the lateral breast and axilla.  I then slowly dissected the breast tissue off the pectoralis fascia with the electrocautery dissecting medial to lateral.  Once I reached the axilla I used the neoprobe to identify the targeted lymph node with a radioactive seed.  I excised this lymph node and surrounding tissue  with electrocautery.  An x-ray was performed confirming that the radioactive seed and previous biopsy clip were in the specimen.  Using the mag trace probe I then identified an area of increased uptake in the deep axilla and took for the lymph nodes in that area as well with the electrocautery.  Several bridging veins and lymphatics were clipped with surgical clips and one was tied off with a 2-0 silk suture.  Once the target nodes were removed I palpated the axilla and found no other palpable lymph nodes or increased uptake of the mag trace.  We made a separate skin incision and I placed a 33 French Blake drain into the axilla and chest mastectomy site.  This was sewn in place with a 2-0 nylon suture.  The subcutaneous tissue of the mastectomy site was then closed with interrupted 3-0 Vicryl sutures and the skin was closed with a running 4-0 Monocryl.  Dermabond was then applied.  The drain was placed to bulb suction.  A binder was then applied.  The patient tolerated procedure well.  All the counts were correct at the end of the procedure.  The patient was then extubated in the operating room and taken in a stable condition to the recovery room.           Abigail Miyamoto   Date: 11/01/2022  Time: 10:43 AM

## 2022-11-01 NOTE — Anesthesia Preprocedure Evaluation (Addendum)
Anesthesia Evaluation  Patient identified by MRN, date of birth, ID band Patient awake    Reviewed: Allergy & Precautions, NPO status , Patient's Chart, lab work & pertinent test results  Airway Mallampati: III  TM Distance: >3 FB Neck ROM: Full   Comment: Previous grade I view with Glidescope 4 Dental  (+) Dental Advisory Given   Pulmonary neg shortness of breath, neg sleep apnea, neg COPD, neg recent URI, former smoker   Pulmonary exam normal breath sounds clear to auscultation       Cardiovascular hypertension (amlodipine, metoprolol, valsartan), Pt. on medications and Pt. on home beta blockers (-) angina (-) Past MI, (-) Cardiac Stents and (-) CABG (-) dysrhythmias + Valvular Problems/Murmurs  Rhythm:Regular Rate:Normal  HLD  TTE 07/09/2022: IMPRESSIONS     1. Left ventricular ejection fraction, by estimation, is 60 to 65%. The  left ventricle has normal function. The left ventricle has no regional  wall motion abnormalities. Left ventricular diastolic parameters are  consistent with Grade I diastolic  dysfunction (impaired relaxation).   2. Right ventricular systolic function is normal. The right ventricular  size is normal. Tricuspid regurgitation signal is inadequate for assessing  PA pressure.   3. The mitral valve is normal in structure. No evidence of mitral valve  regurgitation.   4. The aortic valve is tricuspid. Aortic valve regurgitation is not  visualized. No aortic stenosis is present.   5. The inferior vena cava is normal in size with greater than 50%  respiratory variability, suggesting right atrial pressure of 3 mmHg.     Neuro/Psych neg Seizures PSYCHIATRIC DISORDERS Anxiety     CVA (2015, right hemiparesis), Residual Symptoms    GI/Hepatic Neg liver ROS,GERD  Medicated,,  Endo/Other  diabetes, Type 2    Renal/GU Renal disease (h/o stones)     Musculoskeletal   Abdominal   Peds   Hematology  (+) Blood dyscrasia (Hgb 8.7), anemia   Anesthesia Other Findings Right breast cancer  Last Trulicity: 10/24/2022  Reproductive/Obstetrics                             Anesthesia Physical Anesthesia Plan  ASA: 3  Anesthesia Plan: General   Post-op Pain Management: Regional block* and Tylenol PO (pre-op)*   Induction: Intravenous  PONV Risk Score and Plan: 3 and Ondansetron, Dexamethasone and Treatment may vary due to age or medical condition  Airway Management Planned: LMA  Additional Equipment:   Intra-op Plan:   Post-operative Plan: Extubation in OR  Informed Consent: I have reviewed the patients History and Physical, chart, labs and discussed the procedure including the risks, benefits and alternatives for the proposed anesthesia with the patient or authorized representative who has indicated his/her understanding and acceptance.     Dental advisory given  Plan Discussed with: Anesthesiologist and CRNA  Anesthesia Plan Comments: (Risks of general anesthesia discussed including, but not limited to, sore throat, hoarse voice, chipped/damaged teeth, injury to vocal cords, nausea and vomiting, allergic reactions, lung infection, heart attack, stroke, and death. All questions answered.  Discussed potential risks of nerve blocks including, but not limited to, infection, bleeding, nerve damage, seizures, pneumothorax, respiratory depression, and potential failure of the block. Alternatives to nerve blocks discussed. All questions answered. )        Anesthesia Quick Evaluation

## 2022-11-01 NOTE — Progress Notes (Signed)
Assisted Dr. Jennifer Allan with right, pectoralis, ultrasound guided block. Side rails up, monitors on throughout procedure. See vital signs in flow sheet. Tolerated Procedure well. 

## 2022-11-01 NOTE — Transfer of Care (Signed)
Immediate Anesthesia Transfer of Care Note  Patient: Marilyn Ford  Procedure(s) Performed: RIGHT SIMPLE MASTECTOMY (Right: Breast) RADIOACTIVE SEED GUIDED RIGHT AXILLARY SENTINEL LYMPH NODE DISSECTION (Right: Axilla)  Patient Location: PACU  Anesthesia Type:GA combined with regional for post-op pain  Level of Consciousness: awake, drowsy, and patient cooperative  Airway & Oxygen Therapy: Patient Spontanous Breathing and Patient connected to face mask oxygen  Post-op Assessment: Report given to RN and Post -op Vital signs reviewed and stable  Post vital signs: Reviewed and stable  Last Vitals:  Vitals Value Taken Time  BP    Temp    Pulse 74 11/01/22 1048  Resp    SpO2 97 % 11/01/22 1048  Vitals shown include unvalidated device data.  Last Pain:  Vitals:   11/01/22 0822  TempSrc: Oral  PainSc: 0-No pain      Patients Stated Pain Goal: 4 (11/01/22 1610)  Complications: No notable events documented.

## 2022-11-02 ENCOUNTER — Encounter (HOSPITAL_BASED_OUTPATIENT_CLINIC_OR_DEPARTMENT_OTHER): Payer: Self-pay | Admitting: Surgery

## 2022-11-02 DIAGNOSIS — C50911 Malignant neoplasm of unspecified site of right female breast: Secondary | ICD-10-CM | POA: Diagnosis not present

## 2022-11-02 LAB — GLUCOSE, CAPILLARY: Glucose-Capillary: 111 mg/dL — ABNORMAL HIGH (ref 70–99)

## 2022-11-02 MED ORDER — TRAMADOL HCL 50 MG PO TABS: 50.0000 mg | ORAL_TABLET | Freq: Four times a day (QID) | ORAL | 0 refills | Status: DC | PRN

## 2022-11-02 NOTE — Discharge Summary (Signed)
Physician Discharge Summary  Patient ID: Marilyn Ford MRN: [id number] DOB/AGE: Oct 20, 1960 62 y.o.  Admit date: 11/01/2022 Discharge date: 11/02/2022  Admission Diagnoses:  Discharge Diagnoses:  Principal Problem:   S/P mastectomy, right Right breast cancer  Discharged Condition: good  Hospital Course: uneventful post op recovery.  Discharged home POD#1  Consults: None  Significant Diagnostic Studies:   Treatments: surgery: right mastectomy with targeted lymph node dissection  Discharge Exam: Blood pressure 131/71, pulse 74, temperature 98.2 F (36.8 C), resp. rate 20, height 5\' 2"  (1.575 m), weight 58.6 kg, SpO2 99 %. General appearance: alert, cooperative, and no distress Resp: clear to auscultation bilaterally Cardio: regular rate and rhythm, S1, S2 normal, no murmur, click, rub or gallop Incision/Wound: flaps viable Drain sersang  Disposition: Discharge disposition: 01-Home or Self Care       Discharge Instructions     Diet - low sodium heart healthy   Complete by: As directed    Increase activity slowly   Complete by: As directed       Allergies as of 11/02/2022       Reactions   Penicillins Shortness Of Breath   Sulfa Antibiotics Rash        Medication List     TAKE these medications    aluminum-magnesium hydroxide-simethicone 200-200-20 MG/5ML Susp Commonly known as: MAALOX Take 30 mLs by mouth 4 (four) times daily -  before meals and at bedtime.   amLODipine 10 MG tablet Commonly known as: NORVASC 1 tablet daily   Apple Cider Vinegar 500 MG Tabs Take 500 mg by mouth in the morning.   aspirin EC 81 MG tablet Take 1 tablet (81 mg total) by mouth daily.   atorvastatin 80 MG tablet Commonly known as: LIPITOR TAKE 1 TABLET EVERY DAY AT 6PM   B-D SINGLE USE SWABS REGULAR Pads Test BS daily and as needed Dx E11.9   Calcium-Vitamin D 600-400 MG-UNIT Tabs Take 1 tablet by mouth in the morning.   CoQ10 100 MG Caps Take 100 mg by  mouth in the morning.   Cranberry 425 MG Caps Take 425 mg by mouth in the morning and at bedtime.   diphenoxylate-atropine 2.5-0.025 MG tablet Commonly known as: LOMOTIL Take 1 tablet by mouth 4 (four) times daily as needed for diarrhea or loose stools.   Flaxseed Oil 1000 MG Caps Take 1,000 mg by mouth in the morning.   Krill Oil 500 MG Caps Take 3 capsules (1,500 mg total) by mouth in the morning and at bedtime.   lidocaine 2 % solution Commonly known as: XYLOCAINE Use as directed 15 mLs in the mouth or throat every 6 (six) hours as needed for mouth pain.   lidocaine 5 % ointment Commonly known as: XYLOCAINE Apply 1 Application topically as needed.   lidocaine-prilocaine cream Commonly known as: EMLA Apply to affected area once   LORazepam 1 MG tablet Commonly known as: ATIVAN Take 1 tablet (1 mg total) by mouth 2 (two) times daily as needed for anxiety.   magnesium oxide 400 (240 Mg) MG tablet Commonly known as: MAG-OX TAKE 1 TABLET BY MOUTH TWICE A DAY   meloxicam 15 MG tablet Commonly known as: MOBIC TAKE 1 TABLET (15 MG TOTAL) BY MOUTH DAILY. FOR JOINT AND MUSCLE PAIN   metoprolol 200 MG 24 hr tablet Commonly known as: TOPROL-XL TAKE 1 TABLET ONE TIME DAILY, WITH OR IMMEDIATELY FOLLOWING A MEAL   multivitamin capsule Take 1 capsule by mouth in the morning.  ondansetron 8 MG tablet Commonly known as: ZOFRAN Take 1 tablet (8 mg total) by mouth every 8 (eight) hours as needed for nausea or vomiting.   pantoprazole 40 MG tablet Commonly known as: PROTONIX TAKE 1 TABLET twice daily FOR STOMACH   prochlorperazine 10 MG tablet Commonly known as: COMPAZINE Take 1 tablet (10 mg total) by mouth every 6 (six) hours as needed for nausea or vomiting.   traMADol 50 MG tablet Commonly known as: ULTRAM Take 1 tablet (50 mg total) by mouth every 6 (six) hours as needed for moderate pain or severe pain.   traZODone 150 MG tablet Commonly known as: DESYREL TAKE 1  OR 2 TABLETS AT BEDTIME FOR SLEEP   True Metrix Air Glucose Meter w/Device Kit Test BS daily and as needed Dx E11.9   True Metrix Blood Glucose Test test strip Generic drug: glucose blood Test BS daily and as needed Dx E11.9   True Metrix Level 1 Low Soln Use with glucometer Dx E11.9   TRUEplus Lancets 33G Misc Test BS daily and as needed Dx E11.9   Trulicity 1.5 MG/0.5ML Sopn Generic drug: Dulaglutide Inject content of one pen under the skin weekly   valsartan 320 MG tablet Commonly known as: DIOVAN Take 1 tablet (320 mg total) by mouth daily. For blood pressure.   Vascepa 1 g capsule Generic drug: icosapent Ethyl Take 2 g by mouth 2 (two) times daily.   zinc gluconate 50 MG tablet Take 50 mg by mouth daily.   Zinc Oxide 20 % Pste Apply 1 Application topically 3 (three) times daily.        Follow-up Information     Abigail Miyamoto, MD. Call on 11/12/2022.   Specialty: General Surgery Contact information: [address] [place] [address] [phone]                 Signed: Abigail Miyamoto 11/02/2022, 7:31 AM

## 2022-11-02 NOTE — Discharge Instructions (Signed)
[facility] [phone]  MASTECTOMY: POST OP INSTRUCTIONS  Always review your discharge instruction sheet given to you by the facility where your surgery was performed. IF YOU HAVE DISABILITY OR FAMILY LEAVE FORMS, YOU MUST BRING THEM TO THE OFFICE FOR PROCESSING.   DO NOT GIVE THEM TO YOUR DOCTOR. A prescription for pain medication may be given to you upon discharge.  Take your pain medication as prescribed, if needed.  If narcotic pain medicine is not needed, then you may take acetaminophen (Tylenol) or ibuprofen (Advil) as needed. Take your usually prescribed medications unless otherwise directed. If you need a refill on your pain medication, please contact your pharmacy.  They will contact our office to request authorization.  Prescriptions will not be filled after 5pm or on week-ends. You should follow a light diet the first few days after arrival home, such as soup and crackers, etc.  Resume your normal diet the day after surgery. Most patients will experience some swelling and bruising on the chest and underarm.  Ice packs will help.  Swelling and bruising can take several days to resolve.  It is common to experience some constipation if taking pain medication after surgery.  Increasing fluid intake and taking a stool softener (such as Colace) will usually help or prevent this problem from occurring.  A mild laxative (Milk of Magnesia or Miralax) should be taken according to package instructions if there are no bowel movements after 48 hours. Unless discharge instructions indicate otherwise, leave your bandage dry and in place until your next appointment in 3-5 days.  You may take a limited sponge bath.  No tube baths or showers until the drains are removed.  You may have steri-strips (small skin tapes) in place directly over the incision.  These strips should be left on the skin for 7-10 days.  If your surgeon used skin glue on the incision, you may shower in 24 hours.   The glue will flake off over the next 2-3 weeks.  Any sutures or staples will be removed at the office during your follow-up visit. DRAINS:  If you have drains in place, it is important to keep a list of the amount of drainage produced each day in your drains.  Before leaving the hospital, you should be instructed on drain care.  Call our office if you have any questions about your drains. ACTIVITIES:  You may resume regular (light) daily activities beginning the next day--such as daily self-care, walking, climbing stairs--gradually increasing activities as tolerated.  You may have sexual intercourse when it is comfortable.  Refrain from any heavy lifting or straining until approved by your doctor. You may drive when you are no longer taking prescription pain medication, you can comfortably wear a seatbelt, and you can safely maneuver your car and apply brakes. RETURN TO WORK:  __________________________________________________________ You should see your doctor in the office for a follow-up appointment approximately 3-5 days after your surgery.  Your doctor's nurse will typically make your follow-up appointment when she calls you with your pathology report.  Expect your pathology report 2-3 business days after your surgery.  You may call to check if you do not hear from us after three days.   OTHER INSTRUCTIONS: ______________________________________________________________________________________________ ____________________________________________________________________________________________ WHEN TO CALL YOUR DOCTOR: Fever over 101.0 Nausea and/or vomiting Extreme swelling or bruising Continued bleeding from incision. Increased pain, redness, or drainage from the incision. The clinic staff is available to answer your questions during regular business hours.  Please don't hesitate   to call and ask to speak to one of the nurses for clinical concerns.  If you have a medical emergency, go to the  nearest emergency room or call 911.  A surgeon from [facility] is always on call at the hospital. [address], [address], [place], [place]  [address] ? [address] [address], [place], [place]   [address] [phone] ? [phone] ? FAX [phone]       JP Drain Totals Bring this sheet to all of your post-operative appointments while you have your drains. Please measure your drains by CC's or ML's. Make sure you drain and measure your JP Drains 3 times per day. At the end of each day, add up totals for the left side and add up totals for the right side.    ( 9 am )     ( 3 pm )        ( 9 pm )                Date L  R  L  R  L  R  Total L/R                                                                                                                                                                                          JP Drain Totals Bring this sheet to all of your post-operative appointments while you have your drains. Please measure your drains by CC's or ML's. Make sure you drain and measure your JP Drains 3 times per day. At the end of each day, add up totals for the left side and add up totals for the right side.    ( 9 am )     ( 3 pm )        ( 9 pm )                Date L  R  L  R  L  R  Total L/R                                                                                                               

## 2022-11-05 ENCOUNTER — Telehealth: Payer: Self-pay | Admitting: Family Medicine

## 2022-11-05 NOTE — Telephone Encounter (Signed)
Please help w/ advise

## 2022-11-06 LAB — SURGICAL PATHOLOGY

## 2022-11-21 ENCOUNTER — Encounter: Payer: Self-pay | Admitting: Physical Therapy

## 2022-11-21 ENCOUNTER — Ambulatory Visit: Payer: No Typology Code available for payment source | Attending: Surgery | Admitting: Physical Therapy

## 2022-11-21 DIAGNOSIS — M25611 Stiffness of right shoulder, not elsewhere classified: Secondary | ICD-10-CM | POA: Diagnosis not present

## 2022-11-21 DIAGNOSIS — Z483 Aftercare following surgery for neoplasm: Secondary | ICD-10-CM | POA: Diagnosis not present

## 2022-11-21 DIAGNOSIS — M25511 Pain in right shoulder: Secondary | ICD-10-CM | POA: Insufficient documentation

## 2022-11-21 DIAGNOSIS — Z171 Estrogen receptor negative status [ER-]: Secondary | ICD-10-CM | POA: Diagnosis not present

## 2022-11-21 DIAGNOSIS — R293 Abnormal posture: Secondary | ICD-10-CM | POA: Diagnosis not present

## 2022-11-21 DIAGNOSIS — C50811 Malignant neoplasm of overlapping sites of right female breast: Secondary | ICD-10-CM | POA: Insufficient documentation

## 2022-11-21 NOTE — Therapy (Signed)
OUTPATIENT PHYSICAL THERAPY BREAST CANCER POST OP FOLLOW UP   Patient Name: Marilyn Ford MRN: [id number] DOB:1961/04/18, 62 y.o., female Today's Date: 11/21/2022  END OF SESSION:  PT End of Session - 11/21/22 1247     Visit Number 2    Number of Visits 10    Date for PT Re-Evaluation 12/19/22    PT Start Time 1204    PT Stop Time 1238    PT Time Calculation (min) 34 min    Activity Tolerance Patient tolerated treatment well    Behavior During Therapy WFL for tasks assessed/performed             Past Medical History:  Diagnosis Date   Allergy    Anxiety    Breast cancer (HCC)    Diabetes mellitus without complication (HCC)    GERD (gastroesophageal reflux disease)    Heart murmur    as a child   History of kidney stones    Hypertension    Pneumonia    Stroke [facility])    Vaginal Pap smear, abnormal    Past Surgical History:  Procedure Laterality Date   BREAST BIOPSY  10/31/2022   [place] RT RADIOACTIVE SEED LOC 10/31/2022 [facility]   CESAREAN SECTION     CHOLECYSTECTOMY     COLONOSCOPY WITH PROPOFOL N/A 07/19/2020   Procedure: COLONOSCOPY WITH PROPOFOL;  Surgeon: Dolores Frame, MD;  Location: [facility];  Service: Gastroenterology;  Laterality: N/A;  AM   POLYPECTOMY  07/19/2020   Procedure: POLYPECTOMY;  Surgeon: Dolores Frame, MD;  Location: [facility];  Service: Gastroenterology;;   PORTACATH PLACEMENT N/A 03/20/2022   Procedure: INSERTION PORT-A-CATH;  Surgeon: Abigail Miyamoto, MD;  Location: [facility];  Service: General;  Laterality: N/A;   RADIOACTIVE SEED GUIDED AXILLARY SENTINEL LYMPH NODE Right 11/01/2022   Procedure: RADIOACTIVE SEED GUIDED RIGHT AXILLARY SENTINEL LYMPH NODE DISSECTION;  Surgeon: Abigail Miyamoto, MD;  Location: [facility];  Service: General;  Laterality: Right;   SIMPLE MASTECTOMY WITH AXILLARY SENTINEL NODE BIOPSY Right 11/01/2022   Procedure: RIGHT SIMPLE MASTECTOMY;  Surgeon: Abigail Miyamoto, MD;  Location: [facility];  Service: General;  Laterality: Right;   Patient Active Problem List   Diagnosis Date Noted   S/P mastectomy, right 11/01/2022   Genetic testing 06/05/2022   Neutropenia, drug-induced (HCC) 05/28/2022   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) 03/05/2022   Encounter for screening fecal occult blood testing 03/01/2022   Routine cervical smear 03/01/2022   Axillary adenopathy 02/20/2022   Mass of lower outer quadrant of right breast 01/30/2022   Cat bite of index finger 09/12/2021   Gastroesophageal reflux disease without esophagitis 10/20/2019   GAD (generalized anxiety disorder) 10/20/2019   Insomnia due to medical condition 10/20/2019   Hemiparesis affecting right side as late effect of cerebrovascular accident (CVA) (HCC) 04/22/2015   Diabetes mellitus type II, controlled (HCC) 12/23/2014   Hypertension 12/23/2014    PCP: Mechele Claude, MD   REFERRING PROVIDER: Abigail Miyamoto, MD  REFERRING DIAG:  C50.811,Z17.1 (ICD-10-CM) - Malignant neoplasm of overlapping sites of right breast in female, estrogen receptor negative (HCC)   THERAPY DIAG:  Abnormal posture  Stiffness of right shoulder, not elsewhere classified  Acute pain of right shoulder  Aftercare following surgery for neoplasm  Malignant neoplasm of overlapping sites of right breast in female, estrogen receptor negative (HCC)  Rationale for Evaluation and Treatment: Rehabilitation  ONSET DATE: 02/20/22  SUBJECTIVE:  SUBJECTIVE STATEMENT: I have been doing laundry and a lot of cooking. I am just sore under my arm pit.  PERTINENT HISTORY:  Patient was diagnosed on 02/20/22 with right grade 3. It measures 3.8 cm and is located in the inner lower quadrant.  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  2 nodes taken at biopsy - 1 was negative the other was positive. Had blood transfusion on 10/05/22. Plan is to undergo a R simple mastectomy and SLNB on 11/01/22. Hx of DVT and stroke   PATIENT GOALS:  Reassess how my recovery is going related to arm function, pain, and swelling.  PAIN:  Are you having pain? No  PRECAUTIONS: Recent Surgery, right UE Lymphedema risk, Other: hx of stroke and DVT  RED FLAGS: None   ACTIVITY LEVEL / LEISURE: pt reports she has been going up and down steps, planting   OBJECTIVE:   PATIENT SURVEYS:  QUICK DASH:  Quick Dash - 11/21/22 0001     Open a tight or new jar Moderate difficulty    Do heavy household chores (wash walls, wash floors) Mild difficulty    Carry a shopping bag or briefcase Mild difficulty    Wash your back Moderate difficulty    Use a knife to cut food Moderate difficulty    Recreational activities in which you take some force or impact through your arm, shoulder, or hand (golf, hammering, tennis) Moderate difficulty    During the past week, to what extent has your arm, shoulder or hand problem interfered with your normal social activities with family, friends, neighbors, or groups? Slightly    During the past week, to what extent has your arm, shoulder or hand problem limited your work or other regular daily activities Slightly    Arm, shoulder, or hand pain. Mild    Tingling (pins and needles) in your arm, shoulder, or hand Mild    Difficulty Sleeping Mild difficulty    DASH Score 34.09 %              OBSERVATIONS: Healing mastectomy scar no signs of edema  POSTURE:  Forward head and rounded shoulders posture   UPPER EXTREMITY AROM/PROM:   A/PROM RIGHT   eval   RIGHT 11/21/22  Shoulder extension 68 59  Shoulder flexion 156 101  Shoulder abduction 180 105  Shoulder internal rotation 67 75  Shoulder  external rotation 84 77                          (Blank rows = not tested)   A/PROM LEFT   eval  Shoulder extension 71  Shoulder flexion 155  Shoulder abduction 170  Shoulder internal rotation 58  Shoulder external rotation 93                          (Blank rows = not tested)   CERVICAL AROM: All within normal limits:      Percent limited  Flexion WFL  Extension WFL  Right lateral flexion 25% limited  Left lateral flexion WFL  Right rotation WFL  Left rotation WFL      UPPER EXTREMITY STRENGTH: 5/5   LYMPHEDEMA ASSESSMENTS:    LANDMARK RIGHT   eval RIGHT 11/21/22  10 cm proximal to olecranon process 24.2 26.1  Olecranon process 21.9 22.9  10 cm proximal to ulnar styloid process 19 19.7  Just proximal to ulnar styloid process 14.7 14.9  Across hand at thumb web space 17.7 18.5  At base of 2nd digit 6 6.1  (Blank rows = not tested)   LANDMARK LEFT   eval  10 cm proximal to olecranon process 25.2  Olecranon process 22  10 cm proximal to ulnar styloid process 18.7  Just proximal to ulnar styloid process 14.5  Across hand at thumb web space 18.5  At base of 2nd digit 5.9  (Blank rows = not tested)  Surgery type/Date: 11/01/22: R mastectomy and ALND Number of lymph nodes removed: 1/6 Current/past treatment (chemo, radiation, hormone therapy): completed neoadjuvant chemo, waiting to find out about radiation  Other symptoms:  Heaviness/tightness Yes Pain No Pitting edema No Infections No Decreased scar mobility Yes still healing Stemmer sign No  TREATMENT PERFORMED:  11/21/22: PROM to R Shoulder into flexion and abduction with numerous v/c for pt relax - pt very guarded so stopped and did pulleys x 2 min in direction of flexion and 2 min in abduction with pt demonstrating ability to relax with this  PATIENT EDUCATION:  Education details: ABC class, importance of doing post op stretches, posture and need for scapular retraction Person educated: Patient and  Spouse Education method: Explanation, Demonstration, and Tactile cues Education comprehension: verbalized understanding and returned demonstration  HOME EXERCISE PROGRAM: Reviewed previously given post op HEP.   ASSESSMENT:  CLINICAL IMPRESSION: Pt returns to PT after undergoing a R mastectomy and ALND (1/6) on 11/01/22 for treatment of R breast cancer. She has decreased R shoulder ROM and is very guarded. She reports she has not been doing her post op exercises because she forgot about them. She would benefit from skilled PT services to improve R shoulder ROM and decrease pain and progress pt towards independence with a home exercise program.   Pt will benefit from skilled therapeutic intervention to improve on the following deficits: Decreased knowledge of precautions, impaired UE functional use, pain, decreased ROM, postural dysfunction.   PT treatment/interventions: ADL/Self care home management, Therapeutic exercises, Therapeutic activity, Patient/Family education, Self Care, Joint mobilization, Orthotic/Fit training, Manual lymph drainage, scar mobilization, Taping, Manual therapy, and Re-evaluation   GOALS: Goals reviewed with patient? Yes  LONG TERM GOALS:  (STG=LTG)  GOALS Name Target Date  Goal status  1 Pt will demonstrate she has regained full shoulder ROM and function post operatively compared to baselines.  Baseline: 12/19/22 INITIAL  2 Pt will demonstrate 170 degrees of R shoulder abduction to allow her to reach out to the side. 12/19/22 INITIAL  3 Pt will demonstrate 150 degrees of R shoulder flexion to allow her to reach overhead. 12/19/22 INITIAL  4 Pt will be independent in a home exercise program for continued stretching and strengthening. 12/19/22 INITIAL     PLAN:  PT FREQUENCY/DURATION: 2x/wk for 4 wks  PLAN FOR NEXT SESSION: pulleys, ball, PROM to R shoulder, scap retractions    [facility]  [address], [address]  [place]  [address]  [phone]  After Breast Cancer Class It is recommended you attend the ABC class to be educated on lymphedema risk reduction. This class is free of charge and lasts for 1 hour. It is a 1-time class. You will need to download the TEAMS app either on your phone or computer. We will send you a link the night before or the morning of the class. You should be able to click on that link to join the class. This is  not a confidential class. You don't have to turn your camera on, but other participants may be able to see your email address.  Scar massage You can begin gentle scar massage to you incision sites. Gently place one hand on the incision and move the skin (without sliding on the skin) in various directions. Do this for a few minutes and then you can gently massage either coconut oil or vitamin E cream into the scars.  Compression garment You should continue wearing your compression bra until you feel like you no longer have swelling.  Home exercise Program Continue doing the exercises you were given until you feel like you can do them without feeling any tightness at the end.   Walking Program Studies show that 30 minutes of walking per day (fast enough to elevate your heart rate) can significantly reduce the risk of a cancer recurrence. If you can't walk due to other medical reasons, we encourage you to find another activity you could do (like a stationary bike or water exercise).  Posture After breast cancer surgery, people frequently sit with rounded shoulders posture because it puts their incisions on slack and feels better. If you sit like this and scar tissue forms in that position, you can become very tight and have pain sitting or standing with good posture. Try to be aware of your posture and sit and stand up tall to heal properly.  Follow up PT: It is recommended you return every 3 months for the first 3 years following surgery to be assessed on the SOZO machine for an  L-Dex score. This helps prevent clinically significant lymphedema in 95% of patients. These follow up screens are 10 minute appointments that you are not billed for.  [facility], PT 11/21/2022, 12:54 PM

## 2022-11-22 ENCOUNTER — Ambulatory Visit: Payer: No Typology Code available for payment source | Admitting: Physical Therapy

## 2022-12-04 ENCOUNTER — Telehealth: Payer: Self-pay

## 2022-12-04 ENCOUNTER — Other Ambulatory Visit (HOSPITAL_COMMUNITY): Payer: Self-pay

## 2022-12-04 ENCOUNTER — Inpatient Hospital Stay: Payer: No Typology Code available for payment source

## 2022-12-04 ENCOUNTER — Inpatient Hospital Stay: Payer: No Typology Code available for payment source | Attending: Hematology and Oncology | Admitting: Hematology

## 2022-12-04 ENCOUNTER — Encounter: Payer: Self-pay | Admitting: Hematology

## 2022-12-04 ENCOUNTER — Other Ambulatory Visit: Payer: Self-pay

## 2022-12-04 DIAGNOSIS — Z171 Estrogen receptor negative status [ER-]: Secondary | ICD-10-CM

## 2022-12-04 DIAGNOSIS — Z79899 Other long term (current) drug therapy: Secondary | ICD-10-CM | POA: Insufficient documentation

## 2022-12-04 DIAGNOSIS — Z803 Family history of malignant neoplasm of breast: Secondary | ICD-10-CM | POA: Diagnosis not present

## 2022-12-04 DIAGNOSIS — D72819 Decreased white blood cell count, unspecified: Secondary | ICD-10-CM | POA: Insufficient documentation

## 2022-12-04 DIAGNOSIS — C50811 Malignant neoplasm of overlapping sites of right female breast: Secondary | ICD-10-CM | POA: Diagnosis not present

## 2022-12-04 DIAGNOSIS — Z87891 Personal history of nicotine dependence: Secondary | ICD-10-CM | POA: Diagnosis not present

## 2022-12-04 DIAGNOSIS — Z9221 Personal history of antineoplastic chemotherapy: Secondary | ICD-10-CM | POA: Diagnosis not present

## 2022-12-04 DIAGNOSIS — Z808 Family history of malignant neoplasm of other organs or systems: Secondary | ICD-10-CM | POA: Diagnosis not present

## 2022-12-04 DIAGNOSIS — D696 Thrombocytopenia, unspecified: Secondary | ICD-10-CM | POA: Insufficient documentation

## 2022-12-04 DIAGNOSIS — Z9011 Acquired absence of right breast and nipple: Secondary | ICD-10-CM | POA: Diagnosis not present

## 2022-12-04 LAB — CBC WITH DIFFERENTIAL/PLATELET
Abs Immature Granulocytes: 0.01 10*3/uL (ref 0.00–0.07)
Basophils Absolute: 0 10*3/uL (ref 0.0–0.1)
Basophils Relative: 1 %
Eosinophils Absolute: 0.1 10*3/uL (ref 0.0–0.5)
Eosinophils Relative: 3 %
HCT: 30.1 % — ABNORMAL LOW (ref 36.0–46.0)
Hemoglobin: 10.2 g/dL — ABNORMAL LOW (ref 12.0–15.0)
Immature Granulocytes: 0 %
Lymphocytes Relative: 20 %
Lymphs Abs: 0.7 10*3/uL (ref 0.7–4.0)
MCH: 35.2 pg — ABNORMAL HIGH (ref 26.0–34.0)
MCHC: 33.9 g/dL (ref 30.0–36.0)
MCV: 103.8 fL — ABNORMAL HIGH (ref 80.0–100.0)
Monocytes Absolute: 0.4 10*3/uL (ref 0.1–1.0)
Monocytes Relative: 11 %
Neutro Abs: 2.1 10*3/uL (ref 1.7–7.7)
Neutrophils Relative %: 65 %
Platelets: 105 10*3/uL — ABNORMAL LOW (ref 150–400)
RBC: 2.9 MIL/uL — ABNORMAL LOW (ref 3.87–5.11)
RDW: 14.2 % (ref 11.5–15.5)
WBC: 3.2 10*3/uL — ABNORMAL LOW (ref 4.0–10.5)
nRBC: 0 % (ref 0.0–0.2)

## 2022-12-04 LAB — COMPREHENSIVE METABOLIC PANEL
ALT: 21 U/L (ref 0–44)
AST: 18 U/L (ref 15–41)
Albumin: 3.7 g/dL (ref 3.5–5.0)
Alkaline Phosphatase: 97 U/L (ref 38–126)
Anion gap: 7 (ref 5–15)
BUN: 23 mg/dL (ref 8–23)
CO2: 23 mmol/L (ref 22–32)
Calcium: 8.8 mg/dL — ABNORMAL LOW (ref 8.9–10.3)
Chloride: 103 mmol/L (ref 98–111)
Creatinine, Ser: 0.85 mg/dL (ref 0.44–1.00)
GFR, Estimated: >60 mL/min (ref >60)
Glucose, Bld: 144 mg/dL — ABNORMAL HIGH (ref 70–99)
Potassium: 3.6 mmol/L (ref 3.5–5.1)
Sodium: 133 mmol/L — ABNORMAL LOW (ref 135–145)
Total Bilirubin: 0.8 mg/dL (ref 0.3–1.2)
Total Protein: 7 g/dL (ref 6.5–8.1)

## 2022-12-04 LAB — SAMPLE TO BLOOD BANK

## 2022-12-04 LAB — MAGNESIUM: Magnesium: 1.8 mg/dL (ref 1.7–2.4)

## 2022-12-04 MED ORDER — CAPECITABINE 500 MG PO TABS
1500.0000 mg | ORAL_TABLET | Freq: Two times a day (BID) | ORAL | 7 refills | Status: DC
Start: 2022-12-04 — End: 2022-12-05
  Filled 2022-12-04: qty 84, 14d supply, fill #0

## 2022-12-04 MED ORDER — SODIUM CHLORIDE 0.9% FLUSH
10.0000 mL | INTRAVENOUS | Status: DC | PRN
  Administered 2022-12-04: 10 mL via INTRAVENOUS

## 2022-12-04 MED ORDER — HEPARIN SOD (PORK) LOCK FLUSH 100 UNIT/ML IV SOLN
500.0000 [IU] | Freq: Once | INTRAVENOUS | Status: AC
  Administered 2022-12-04: 500 [IU] via INTRAVENOUS

## 2022-12-04 NOTE — Progress Notes (Signed)
[facility] [address], [place] [address]    Clinic Day:  12/04/2022  Referring physician: Mechele Claude, MD  Patient Care Team: Mechele Claude, MD as PCP - General (Family Medicine) Mechele Claude, MD (Family Medicine) Danella Maiers, [facility] as Pharmacist (Family Medicine) Marguerita Merles, Reuel Boom, MD as Consulting Physician (Gastroenterology) Franky Macho, MD as Consulting Physician (General Surgery) Pershing Proud, RN as Oncology Nurse Navigator [name], RN as Oncology Nurse Navigator [name], MD as Medical Oncologist (Medical Oncology) Therese Sarah, RN as Oncology Nurse Navigator (Medical Oncology) [name], MD as Consulting Physician (Hematology) Mechele Claude, MD as Referring Physician (Family Medicine)   ASSESSMENT & PLAN:   Assessment: 1.  Inflammatory right breast cancer: - Bilateral diagnostic mammogram (02/20/2022): Suspicious right axillary lymphadenopathy.  Indeterminate intramammary lymph node in the right breast at 9:00.  New right breast skin and trabecular thickening, related to vascular congestion from enlarged lymph nodes in the right axilla. - Right axillary lymph node core biopsy (02/20/2022): Morphology and IHC compatible with primary breast cancer with differential diagnosis of urothelial carcinoma and primary lung carcinoma (squamous).  Grade 3.  ER 0%, PR 0%, HER2 2+, Ki-67 60%, HER2 negative by FISH. - Right breast lymph node biopsy at 9:00 (02/20/2022): Negative for carcinoma. - MRI breast (03/02/2022): In the upper outer right breast, mid to posterior depth there is a patchy clumped non-mass enhancement in a linear orientation spanning approximately 3.8 cm.  There is diffuse thickening of the skin in the right breast with skin enhancement.  Left breast with no mass or abnormal enhancement.  Numerous bulky matted lymph nodes in the right axilla. - Right breast UOQ biopsy (03/19/2022): Benign  breast with fibrocystic changes including stromal fibrosis, adenosis, usual ductal hyperplasia.  Negative for carcinoma. - PET scan (03/22/2022): Bulky hypermetabolic right axillary and subpectoral lymphadenopathy.  No other definite new sites of metastatic disease.  6 mm right lower lobe lung nodule with no FDG uptake. - Neoadjuvant chemotherapy with weekly paclitaxel and pembrolizumab from 04/02/2022 through 06/21/2022,  AC and pembrolizumab from 07/12/2022 through 09/27/2022 - Right mastectomy and lymph node excision (11/01/2022): YpT0, YPN1A, 2/3 lymph nodes positive for metastatic disease, no ECE.  Surgical margins negative. - Germline mutation testing: Negative - Adjuvant pembrolizumab and Xeloda (CREATE-X) started on    Plan: 1.  Triple negative right breast cancer: - She had right mastectomy and lymph node dissection on 11/01/2022 and is recovering well from surgery. - Surgical site is well-healed. - We reviewed pathology report in detail which showed complete pathological response in the breast tissue.  However 2/3 lymph nodes were positive.  Overall pathology was ypT0a pN1a. - I have recommended 9 more cycles of adjuvant pembrolizumab every 3 weeks. - We discussed further treatment with Xeloda 2 weeks on/1 week off for 8 cycles based on CREATE-X trial which showed improvement in overall survival in triple negative breast cancers with residual disease after neoadjuvant chemotherapy. - We have also discussed available clinical trial with Sacituzumab  for 8 cycles with Keytruda.  However patient is not interested. - We will start her on Xeloda 1500 mg twice daily 2 weeks on/1 week off.  We discussed side effects in detail.  Literature was given to the patient. - We will make a referral to radiation oncology.   2.  Hypomagnesemia: - Continue magnesium twice daily.  Magnesium is normal at 1.8 today.   3.  Mild leukopenia and thrombocytopenia: - This is likely  residual effect from prior  chemotherapy.  Will closely monitor.    No orders of the defined types were placed in this encounter.     Alben Deeds Teague,acting as a Neurosurgeon for [name], MD.,have documented all relevant documentation on the behalf of [name], MD,as directed by  [name], MD while in the presence of [name], MD.  I, [name] MD, have reviewed the above documentation for accuracy and completeness, and I agree with the above.    [name], MD   7/30/20244:31 PM  CHIEF COMPLAINT:   Diagnosis: locally advanced TNBC    Cancer Staging  Malignant neoplasm of overlapping sites of right female breast [facility]) Staging form: Breast, AJCC 8th Edition - Clinical stage from 03/08/2022: Stage IIIC (cT4d, cN2, cM0, G3, ER-, PR-, HER2-) - Signed by Malachy Mood, MD on 03/08/2022    Prior Therapy: 1. carboplatin and paclitaxel, 4 cycles, 04/02/22 - 06/06/22 2. Pembrolizumab, 04/02/22 - 09/27/22 3. Adriamycin and Cytoxan, 4 cycles, 07/12/22 - 09/27/22  Current Therapy:  pending surgery   HISTORY OF PRESENT ILLNESS:   Oncology History Overview Note   Cancer Staging  Malignant neoplasm of overlapping sites of right female breast [facility]) Staging form: Breast, AJCC 8th Edition - Clinical stage from 03/08/2022: Stage IIIC (cT2, cN2, cM0, G3, ER-, PR-, HER2-) - Signed by Malachy Mood, MD on 03/08/2022    Malignant neoplasm of overlapping sites of right female breast [facility])  02/20/2022 Mammogram   CLINICAL DATA:  62 year old female recalled from screening mammography 03/01/2021 for right breast calcifications and subsequent benign discordant biopsy of these calcifications in the central posterior right breast December 2022 with excision recommended. The patient initially followed up with surgery in April however canceled her scheduled surgery and most recently followed up with Dr. Henreitta Leber September 2023 with diagnostic imaging, possible RF tag placement and  subsequent excision recommended.   EXAM: DIGITAL DIAGNOSTIC BILATERAL MAMMOGRAM WITH TOMOSYNTHESIS; ULTRASOUND RIGHT BREAST LIMITED  MPRESSION: 1.  Suspicious right axillary lymphadenopathy.   2. Indeterminate intramammary lymph node in the right breast at 9 o'clock.   3. 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   4. Decreased calcifications noted at prior benign biopsy site in the lower central posterior right breast at site of X shaped biopsy marking clip.   02/20/2022 Initial Biopsy   FINAL MICROSCOPIC DIAGNOSIS:   A. AXILLA, RIGHT, LYMPH NODE, NEEDLE CORE BIOPSY:  - Positive for carcinoma (see Comment)   B. LYMPH NODE, RIGHT BREAST, BIOPSY:  - Negative for carcinoma   COMMENT:  Part A: Morphology and immunohistochemical staining are most compatible with primary breast carcinoma with metaplastic changes, however differential diagnosis also includes urothelial carcinoma and less likely primary lung carcinoma (squamous).  No lymphoid tissue is identified.  Clinical and radiologic correlation is suggested.   ADDENDUM:  In case of a breast origin, the appropriate grade would be grade 3  (3+3+2)   ADDENDUM:  PROGNOSTIC INDICATOR RESULTS:  The tumor cells are EQUIVOCAL for Her2 (2+).  Estrogen Receptor:       0%, NEGATIVE  Progesterone Receptor:   0%, NEGATIVE  Proliferation Marker Ki-67:   60%   ADDENDUM:  FLOURESCENCE IN-SITU HYBRIDIZATION RESULTS:  GROUP 5:   HER2 **NEGATIVE**    03/02/2022 Imaging   EXAM: BILATERAL BREAST MRI WITH AND WITHOUT CONTRAST  IMPRESSION: 1. There is a suspicious 3.8 cm area  of patchy non mass enhancement in a linear orientation in the slightly upper outer right breast in the mid to posterior depth spanning 3.8 cm.   2. Diffuse skin thickening with enhancement of the skin, concerning for  inflammatory breast cancer.   3. Numerous bulky matted lymph nodes in the right axilla, one of which corresponds with the biopsy-proven metastatic lymph node.   4.  No evidence of left breast malignancy.   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)   03/08/2022 Cancer Staging   Staging form: Breast, AJCC 8th Edition - Clinical stage from 03/08/2022: Stage IIIC (cT4d, cN2, cM0, G3, ER-, PR-, HER2-) - Signed by Malachy Mood, MD on 03/08/2022 Histologic grading system: 3 grade system   03/19/2022 Pathology Results   Diagnosis Breast, right, needle core biopsy, upper outer quadrant, barbell clip BENIGN BREAST WITH FIBROCYSTIC CHANGES INCLUDING STROMAL FIBROSIS, ADENOSIS AND USUAL DUCT HYPERPLASIA BENIGN FIBROMATOID CHANGE NEGATIVE FOR MICROCALCIFICATIONS NEGATIVE FOR CARCINOMA   03/22/2022 PET scan   IMPRESSION: Bulky hypermetabolic right axillary and subpectoral lymphadenopathy, consistent with metastatic disease.   No other definite sites of metastatic disease identified.   6 mm right lower lobe pulmonary nodule shows no FDG uptake, but is too small to definitively characterize by PET. Recommend continued follow-up by chest CT in 3-4 months.   Aortic Atherosclerosis (ICD10-I70.0).   04/02/2022 - 09/28/2022 Chemotherapy   Patient is on Treatment Plan : BREAST Pembrolizumab (200) D1 + Carboplatin (5) D1 + Paclitaxel (80) D1,8,15 q21d X 4 cycles / Pembrolizumab (200) D1 + AC D1 q21d x 4 cycles      Genetic Testing   Negative genetic testing. No pathogenic variants identified on the [facility] Common Hereditary Cancers+RNA panel. The report date is 05/31/2022.  The Common Hereditary Cancers Panel + RNA offered by [facility] includes sequencing and/or deletion duplication testing of the following 48 genes: APC*, ATM*, AXIN2, BAP1, BARD1, BMPR1A, BRCA1, BRCA2, BRIP1, CDH1, CDK4, CDKN2A (p14ARF), CDKN2A (p16INK4a), CHEK2, CTNNA1, DICER1*,  EPCAM*, FH*, GREM1*, HOXB13, KIT, MBD4, MEN1*, MLH1*, MSH2*, MSH3*, MSH6*, MUTYH, NF1*, NTHL1, PALB2, PDGFRA, PMS2*, POLD1*, POLE, PTEN*, RAD51C, RAD51D, SDHA*, SDHB, SDHC*, SDHD, SMAD4, SMARCA4, STK11, TP53, TSC1*, TSC2, VHL.    12/11/2022 -  Chemotherapy   Patient is on Treatment Plan : BREAST Pembrolizumab (200) q21d x 27 weeks        INTERVAL HISTORY:   Mohogany is a 62 y.o. female presenting to clinic today for follow up of locally advanced TNBC. She was last seen by me on 10/29/22.  Since her last visit, she underwent a right mastectomy with Dr. Magnus Ivan on 11/01/22. Surgical pathology revealed in the right breast: fibrosis with mild inflammation and hemosiderin associated with three biopsy sites and three biopsy clips, focal atypical ductal hyperplasia, negative for residual invasive carcinoma, one lymph node with biopsy site and ribbon clip, negative for metastatic  carcinoma (0/1), and all surgical margins negative for carcinoma. The right axillary lymph node revealed: metastatic carcinoma in one lymph node with biopsy site and biopsy clip (1/1), metastasis is 0.2 cm, negative for extracapsular extension, and three additional lymph nodes negative for metastatic carcinoma (0/3). The right axillary lymph node sentinel revealed: metastatic carcinoma in 1 lymph node (1/1), metastasis is 0.15 cm, and negative for extracapsular extension.   Today, she states that she is doing well overall. Her appetite level is at 100%. Her energy level is at 90%.  PAST MEDICAL HISTORY:   Past Medical History: Past Medical  History:  Diagnosis Date   Allergy    Anxiety    Breast cancer (HCC)    Diabetes mellitus without complication (HCC)    GERD (gastroesophageal reflux disease)    Heart murmur    as a child   History of kidney stones    Hypertension    Pneumonia    Stroke [facility])    Vaginal Pap smear, abnormal     Surgical History: Past Surgical History:  Procedure Laterality Date   BREAST BIOPSY   10/31/2022   [place] RT RADIOACTIVE SEED LOC 10/31/2022 [facility] MAMMOGRAPHY   CESAREAN SECTION     CHOLECYSTECTOMY     COLONOSCOPY WITH PROPOFOL N/A 07/19/2020   Procedure: COLONOSCOPY WITH PROPOFOL;  Surgeon: Dolores Frame, MD;  Location: [facility];  Service: Gastroenterology;  Laterality: N/A;  AM   POLYPECTOMY  07/19/2020   Procedure: POLYPECTOMY;  Surgeon: Marguerita Merles, Reuel Boom, MD;  Location: [facility];  Service: Gastroenterology;;   PORTACATH PLACEMENT N/A 03/20/2022   Procedure: INSERTION PORT-A-CATH;  Surgeon: Abigail Miyamoto, MD;  Location: [facility];  Service: General;  Laterality: N/A;   RADIOACTIVE SEED GUIDED AXILLARY SENTINEL LYMPH NODE Right 11/01/2022   Procedure: RADIOACTIVE SEED GUIDED RIGHT AXILLARY SENTINEL LYMPH NODE DISSECTION;  Surgeon: Abigail Miyamoto, MD;  Location: [facility];  Service: General;  Laterality: Right;   SIMPLE MASTECTOMY WITH AXILLARY SENTINEL NODE BIOPSY Right 11/01/2022   Procedure: RIGHT SIMPLE MASTECTOMY;  Surgeon: Abigail Miyamoto, MD;  Location: [facility];  Service: General;  Laterality: Right;    Social History: Social History   Socioeconomic History   Marital status: Married    Spouse name: Fayrene Fearing   Number of children: 2   Years of education: 10   Highest education level: 10th grade  Occupational History   Occupation: disabled  Tobacco Use   Smoking status: Former    Current packs/day: 0.00    Average packs/day: 0.5 packs/day for 25.0 years (12.5 ttl pk-yrs)    Types: Cigarettes    Start date: 12/08/1999    Quit date: 03/12/2015    Years since quitting: 7.7   Smokeless tobacco: Never  Vaping Use   Vaping status: Never Used  Substance and Sexual Activity   Alcohol use: Yes    Alcohol/week: 2.0 - 3.0 standard drinks of alcohol    Types: 2 - 3 Glasses of wine per week   Drug use: No   Sexual activity: Not Currently    Birth control/protection: Abstinence, Post-menopausal  Other Topics  Concern   Not on file  Social History Narrative   Volunteers at [employer] for a few hours every day.    She really enjoys getting out of of the house and working there.    Social Determinants of Health   Financial Resource Strain: Low Risk  (09/08/2022)   Overall Financial Resource Strain (CARDIA)    Difficulty of Paying Living Expenses: Not very hard  Food Insecurity: No Food Insecurity (09/08/2022)   Hunger Vital Sign    Worried About Running Out of Food in the Last Year: Never true    Ran Out of Food in the Last Year: Never true  Transportation Needs: No Transportation Needs (09/08/2022)   PRAPARE - Administrator, Civil Service (Medical): No    Lack of Transportation (Non-Medical): No  Physical Activity: Inactive (09/08/2022)   Exercise Vital Sign    Days of Exercise per Week: 0 days    Minutes of Exercise per  Session: 0 min  Stress: Stress Concern Present (09/08/2022)   [employer] of Occupational Health - Occupational Stress Questionnaire    Feeling of Stress : To some extent  Social Connections: Unknown (09/08/2022)   Social Connection and Isolation Panel [NHANES]    Frequency of Communication with Friends and Family: More than three times a week    Frequency of Social Gatherings with Friends and Family: Once a week    Attends Religious Services: Patient declined    Database administrator or Organizations: No    Attends Banker Meetings: Never    Marital Status: Married  Catering manager Violence: Not At Risk (09/04/2022)   Humiliation, Afraid, Rape, and Kick questionnaire    Fear of Current or Ex-Partner: No    Emotionally Abused: No    Physically Abused: No    Sexually Abused: No    Family History: Family History  Problem Relation Age of Onset   Diabetes Mother    Uterine cancer Mother 69 - 71   Diabetes Brother    Breast cancer Maternal Aunt        dx >50, d. from cancer   Cancer Maternal Grandmother        unk type, "back  cancer?"    Current Medications:  Current Outpatient Medications:    Alcohol Swabs (B-D SINGLE USE SWABS REGULAR) PADS, Test BS daily and as needed Dx E11.9, Disp: 100 each, Rfl: 3   aluminum-magnesium hydroxide-simethicone (MAALOX) 200-200-20 MG/5ML SUSP, Take 30 mLs by mouth 4 (four) times daily -  before meals and at bedtime., Disp: 480 mL, Rfl: 2   amLODipine (NORVASC) 10 MG tablet, 1 tablet daily, Disp: 90 tablet, Rfl: 3   Apple Cider Vinegar 500 MG TABS, Take 500 mg by mouth in the morning., Disp: , Rfl:    aspirin 81 MG EC tablet, Take 1 tablet (81 mg total) by mouth daily., Disp: 30 tablet, Rfl: 0   atorvastatin (LIPITOR) 80 MG tablet, TAKE 1 TABLET EVERY DAY AT 6PM, Disp: 90 tablet, Rfl: 3   Blood Glucose Calibration (TRUE METRIX LEVEL 1) Low SOLN, Use with glucometer Dx E11.9, Disp: 3 each, Rfl: 0   Blood Glucose Monitoring Suppl (TRUE METRIX AIR GLUCOSE METER) w/Device KIT, Test BS daily and as needed Dx E11.9, Disp: 1 kit, Rfl: 0   Calcium Carb-Cholecalciferol (CALCIUM-VITAMIN D) 600-400 MG-UNIT TABS, Take 1 tablet by mouth in the morning., Disp: , Rfl:    capecitabine (XELODA) 500 MG tablet, Take 3 tablets (1,500 mg total) by mouth 2 (two) times daily after a meal., Disp: 84 tablet, Rfl: 7   Coenzyme Q10 (COQ10) 100 MG CAPS, Take 100 mg by mouth in the morning., Disp: , Rfl:    Cranberry 425 MG CAPS, Take 425 mg by mouth in the morning and at bedtime., Disp: , Rfl:    diphenoxylate-atropine (LOMOTIL) 2.5-0.025 MG tablet, Take 1 tablet by mouth 4 (four) times daily as needed for diarrhea or loose stools., Disp: 30 tablet, Rfl: 0   Dulaglutide (TRULICITY) 1.5 MG/0.5ML SOPN, Inject content of one pen under the skin weekly, Disp: 6 mL, Rfl: 3   Flaxseed, Linseed, (FLAXSEED OIL) 1000 MG CAPS, Take 1,000 mg by mouth in the morning., Disp: , Rfl:    glucose blood (TRUE METRIX BLOOD GLUCOSE TEST) test strip, Test BS daily and as needed Dx E11.9, Disp: 100 each, Rfl: 3   icosapent Ethyl  (VASCEPA) 1 g capsule, Take 2 g by mouth 2 (two) times daily.,  Disp: , Rfl:    Krill Oil 500 MG CAPS, Take 3 capsules (1,500 mg total) by mouth in the morning and at bedtime., Disp: , Rfl:    lidocaine (XYLOCAINE) 2 % solution, Use as directed 15 mLs in the mouth or throat every 6 (six) hours as needed for mouth pain., Disp: 250 mL, Rfl: 1   lidocaine (XYLOCAINE) 5 % ointment, Apply 1 Application topically as needed., Disp: 35.44 g, Rfl: 0   LORazepam (ATIVAN) 1 MG tablet, Take 1 tablet (1 mg total) by mouth 2 (two) times daily as needed for anxiety., Disp: 10 tablet, Rfl: 0   magnesium oxide (MAG-OX) 400 (240 Mg) MG tablet, TAKE 1 TABLET BY MOUTH TWICE A DAY, Disp: 60 tablet, Rfl: 1   meloxicam (MOBIC) 15 MG tablet, TAKE 1 TABLET (15 MG TOTAL) BY MOUTH DAILY. FOR JOINT AND MUSCLE PAIN, Disp: 90 tablet, Rfl: 1   metoprolol (TOPROL-XL) 200 MG 24 hr tablet, TAKE 1 TABLET ONE TIME DAILY, WITH OR IMMEDIATELY FOLLOWING A MEAL, Disp: 90 tablet, Rfl: 3   Multiple Vitamin (MULTIVITAMIN) capsule, Take 1 capsule by mouth in the morning., Disp: , Rfl:    ondansetron (ZOFRAN) 8 MG tablet, Take 1 tablet (8 mg total) by mouth every 8 (eight) hours as needed for nausea or vomiting., Disp: 30 tablet, Rfl: 3   pantoprazole (PROTONIX) 40 MG tablet, TAKE 1 TABLET twice daily FOR STOMACH, Disp: 180 tablet, Rfl: 3   traZODone (DESYREL) 150 MG tablet, TAKE 1 OR 2 TABLETS AT BEDTIME FOR SLEEP, Disp: 180 tablet, Rfl: 3   TRUEplus Lancets 33G MISC, Test BS daily and as needed Dx E11.9, Disp: 100 each, Rfl: 3   valsartan (DIOVAN) 320 MG tablet, Take 1 tablet (320 mg total) by mouth daily. For blood pressure., Disp: 90 tablet, Rfl: 1   zinc gluconate 50 MG tablet, Take 50 mg by mouth daily., Disp: , Rfl:    Zinc Oxide 20 % PSTE, Apply 1 Application topically 3 (three) times daily., Disp: 57 g, Rfl: 1   Allergies: Allergies  Allergen Reactions   Penicillins Shortness Of Breath   Sulfa Antibiotics Rash    REVIEW OF  SYSTEMS:   Review of Systems  Constitutional:  Negative for chills, fatigue and fever.  HENT:   Negative for lump/mass, mouth sores, nosebleeds, sore throat and trouble swallowing.   Eyes:  Negative for eye problems.  Respiratory:  Negative for cough and shortness of breath.   Cardiovascular:  Negative for chest pain, leg swelling and palpitations.  Gastrointestinal:  Negative for abdominal pain, constipation, diarrhea, nausea and vomiting.  Genitourinary:  Negative for bladder incontinence, difficulty urinating, dysuria, frequency, hematuria and nocturia.   Musculoskeletal:  Negative for arthralgias, back pain, flank pain, myalgias and neck pain.  Skin:  Negative for itching and rash.  Neurological:  Positive for headaches. Negative for dizziness and numbness.  Hematological:  Does not bruise/bleed easily.  Psychiatric/Behavioral:  Negative for depression, sleep disturbance and suicidal ideas. The patient is not nervous/anxious.   All other systems reviewed and are negative.    VITALS:   There were no vitals taken for this visit.  Wt Readings from Last 3 Encounters:  12/04/22 132 lb 9.6 oz (60.1 kg)  11/01/22 129 lb 3 oz (58.6 kg)  10/29/22 126 lb 6.4 oz (57.3 kg)    There is no height or weight on file to calculate BMI.  Performance status (ECOG): 1 - Symptomatic but completely ambulatory  PHYSICAL EXAM:   Physical  Exam Vitals and nursing note reviewed. Exam conducted with a chaperone present.  Constitutional:      Appearance: Normal appearance.  Cardiovascular:     Rate and Rhythm: Normal rate and regular rhythm.     Pulses: Normal pulses.     Heart sounds: Normal heart sounds.  Pulmonary:     Effort: Pulmonary effort is normal.     Breath sounds: Normal breath sounds.  Abdominal:     Palpations: Abdomen is soft. There is no hepatomegaly, splenomegaly or mass.     Tenderness: There is no abdominal tenderness.  Musculoskeletal:     Right lower leg: No edema.     Left  lower leg: No edema.  Lymphadenopathy:     Cervical: No cervical adenopathy.     Right cervical: No superficial, deep or posterior cervical adenopathy.    Left cervical: No superficial, deep or posterior cervical adenopathy.     Upper Body:     Right upper body: No supraclavicular or axillary adenopathy.     Left upper body: No supraclavicular or axillary adenopathy.  Neurological:     General: No focal deficit present.     Mental Status: She is alert and oriented to person, place, and time.  Psychiatric:        Mood and Affect: Mood normal.        Behavior: Behavior normal.     LABS:      Latest Ref Rng & Units 12/04/2022    2:09 PM 10/29/2022    9:27 AM 10/03/2022    2:14 PM  CBC  WBC 4.0 - 10.5 K/uL 3.2  4.5  0.4   Hemoglobin 12.0 - 15.0 g/dL 16.1  8.7  6.2   Hematocrit 36.0 - 46.0 % 30.1  26.7  18.3   Platelets 150 - 400 K/uL 105  146  98       Latest Ref Rng & Units 12/04/2022    2:09 PM 10/29/2022    9:27 AM 09/27/2022    8:25 AM  CMP  Glucose 70 - 99 mg/dL 096  045  409   BUN 8 - 23 mg/dL 23  13  12    Creatinine 0.44 - 1.00 mg/dL 8.11  9.14  7.82   Sodium 135 - 145 mmol/L 133  135  136   Potassium 3.5 - 5.1 mmol/L 3.6  4.1  4.2   Chloride 98 - 111 mmol/L 103  104  100   CO2 22 - 32 mmol/L 23  25  25    Calcium 8.9 - 10.3 mg/dL 8.8  8.9  9.0   Total Protein 6.5 - 8.1 g/dL 7.0  6.6  6.1   Total Bilirubin 0.3 - 1.2 mg/dL 0.8  0.7  0.5   Alkaline Phos 38 - 126 U/L 97  113  94   AST 15 - 41 U/L 18  21  19    ALT 0 - 44 U/L 21  24  19       No results found for: "CEA1", "CEA" / No results found for: "CEA1", "CEA" No results found for: "PSA1" No results found for: "NFA213" No results found for: "CAN125"  No results found for: "TOTALPROTELP", "ALBUMINELP", "A1GS", "A2GS", "BETS", "BETA2SER", "GAMS", "MSPIKE", "SPEI" No results found for: "TIBC", "FERRITIN", "IRONPCTSAT" No results found for: "LDH"   STUDIES:   No results found.

## 2022-12-04 NOTE — Progress Notes (Signed)
DISCONTINUE ON PATHWAY REGIMEN - Breast     Cycles 1 through 4: A cycle is every 21 days:     Pembrolizumab      Paclitaxel      Carboplatin      Filgrastim-xxxx    Cycles 5 through 8: A cycle is every 21 days:     Pembrolizumab      Doxorubicin      Cyclophosphamide      Pegfilgrastim-xxxx   **Always confirm dose/schedule in your pharmacy ordering system**  REASON: Other Reason PRIOR TREATMENT: [id number]: Pembrolizumab 200 mg D1 + Paclitaxel 80 mg/m2 D1, 8, 15 + Carboplatin AUC=5 D1 q21 Days x 12 Weeks, Followed by Pembrolizumab 200 mg + Doxorubicin + Cyclophosphamide q21 Days x 12 Weeks, Followed by Surgery TREATMENT RESPONSE: Partial Response (PR)    Patient Characteristics: Post-Neoadjuvant Therapy and Resection, M0, HER2 Negative, Residual Disease Therapeutic Status: Post-Neoadjuvant Therapy and Resection, M0 Residual Invasive Disease Post-Neoadjuvant Therapy<= Yes ER Status: Negative (-) HER2 Status: Negative (-) PR Status: Negative (-)

## 2022-12-04 NOTE — Telephone Encounter (Signed)
Oral Oncology Patient Advocate Encounter  After completing a benefits investigation, prior authorization for Capecitabine is not required at this time through [facility] [employer] Medicare Part D.  Patient's copay is $0.00.     Ardeen Fillers, CPhT Oncology Pharmacy Patient Advocate  [facility]  [phone] (phone) [phone] (fax) 12/04/2022 4:15 PM

## 2022-12-04 NOTE — Progress Notes (Signed)
START ON PATHWAY REGIMEN - Breast     A cycle is every 21 days:     Pembrolizumab   **Always confirm dose/schedule in your pharmacy ordering system**  Patient Characteristics: Post-Neoadjuvant Therapy and Resection, M0, HER2 Negative, Residual Disease, ER Negative, Received Neoadjuvant Pembrolizumab + Chemotherapy Therapeutic Status: Post-Neoadjuvant Therapy and Resection, M0 Residual Invasive Disease Post-Neoadjuvant Therapy<= Yes ER Status: Negative (-) HER2 Status: Negative (-) PR Status: Negative (-) BRCA Mutation Status: Absent Intent of Therapy: Curative Intent, Discussed with Patient

## 2022-12-04 NOTE — Progress Notes (Signed)
Marilyn Ford presented for Portacath access and flush. Portacath located left  chest wall accessed with  H 20 needle. Good blood return present. Portacath flushed with 20ml NS and 500U/57ml Heparin and needle removed intact. Procedure without incident. Patient tolerated procedure well.

## 2022-12-04 NOTE — Patient Instructions (Addendum)
[facility]  Discharge Instructions  You were seen and examined today by Dr. Ellin Saba.  Dr. Ellin Saba discussed continuing Keytruda and recommends you taking 6 months worth of Xeloda since your cancer is triple negative.   Dr. Ellin Saba sent in Xeloda to the pharmacy for you to take 3 pills 1500 mg in the morning and 3 pills 1,500 mg in the evening two weeks on with one week off in between.   Dr. Ellin Saba is referring you to Dr. Langston Masker in [place] for Radiation therapy.  Your labs revealed that everything looks good and stable.  Follow-up as scheduled.    Thank you for choosing [facility] to provide your oncology and hematology care.   To afford each patient quality time with our provider, please arrive at least 15 minutes before your scheduled appointment time. You may need to reschedule your appointment if you arrive late (10 or more minutes). Arriving late affects you and other patients whose appointments are after yours.  Also, if you miss three or more appointments without notifying the office, you may be dismissed from the clinic at the provider's discretion.    Again, thank you for choosing [facility].  Our hope is that these requests will decrease the amount of time that you wait before being seen by our physicians.   If you have a lab appointment with the Cancer Center - please note that after April 8th, all labs will be drawn in the cancer center.  You do not have to check in or register with the main entrance as you have in the past but will complete your check-in at the cancer center.            _____________________________________________________________  Should you have questions after your visit to [facility], please contact our office at [phone] and follow the prompts.  Our office hours are 8:00 a.m. to 4:30 p.m. Monday - Thursday and 8:00 a.m. to 2:30 p.m. Friday.  Please note  that voicemails left after 4:00 p.m. may not be returned until the following business day.  We are closed weekends and all major holidays.  You do have access to a nurse 24-7, just call the main number to the clinic [phone] and do not press any options, hold on the line and a nurse will answer the phone.    For prescription refill requests, have your pharmacy contact our office and allow 72 hours.    Masks are no longer required in the cancer centers. If you would like for your care team to wear a mask while they are taking care of you, please let them know. You may have one support person who is at least 62 years old accompany you for your appointments.

## 2022-12-04 NOTE — Progress Notes (Signed)
ON PATHWAY REGIMEN - Breast  No Change  Continue With Treatment as Ordered.  Original Decision Date/Time: 03/14/2022 10:45     Cycles 1 through 4: A cycle is every 21 days:     Pembrolizumab      Paclitaxel      Carboplatin      Filgrastim-xxxx    Cycles 5 through 8: A cycle is every 21 days:     Pembrolizumab      Doxorubicin      Cyclophosphamide      Pegfilgrastim-xxxx   **Always confirm dose/schedule in your pharmacy ordering system**  Patient Characteristics: Preoperative or Nonsurgical Candidate (Clinical Staging), Neoadjuvant Therapy followed by Surgery, Invasive Disease, Chemotherapy, HER2 Negative, ER Negative, Platinum Therapy Indicated and Candidate for Checkpoint Inhibitor Therapeutic Status: Preoperative or Nonsurgical Candidate (Clinical Staging) AJCC M Category: cM0 AJCC Grade: G3 Breast Surgical Plan: Neoadjuvant Therapy followed by Surgery ER Status: Negative (-) AJCC 8 Stage Grouping: IIIC HER2 Status: Negative (-) AJCC T Category: cT4 AJCC N Category: cN2 PR Status: Negative (-) Intent of Therapy: Curative Intent, Discussed with Patient

## 2022-12-05 ENCOUNTER — Other Ambulatory Visit (HOSPITAL_COMMUNITY): Payer: Self-pay

## 2022-12-05 ENCOUNTER — Telehealth: Payer: Self-pay | Admitting: Pharmacist

## 2022-12-05 ENCOUNTER — Other Ambulatory Visit: Payer: Self-pay

## 2022-12-05 DIAGNOSIS — Z171 Estrogen receptor negative status [ER-]: Secondary | ICD-10-CM

## 2022-12-05 MED ORDER — CAPECITABINE 500 MG PO TABS
1500.0000 mg | ORAL_TABLET | Freq: Two times a day (BID) | ORAL | 7 refills | Status: DC
  Filled 2022-12-05: qty 84, 14d supply, fill #0
  Filled 2022-12-06: qty 84, 21d supply, fill #0
  Filled 2022-12-21: qty 84, 21d supply, fill #1
  Filled 2023-01-16: qty 84, 21d supply, fill #2

## 2022-12-05 NOTE — Telephone Encounter (Signed)
Clinical Pharmacist Practitioner Encounter   Received new prescription for Xeloda (capecitabine) for the adjuvant treatment of breast cancer, triple negative, in conjunction with pembrolizumab, planned duration of 8 capecitabine cycles.  CMP from 12/04/22 assessed, no relevant lab abnormalities. Prescription dose and frequency assessed.   Current medication list in Epic reviewed, one potentially relevant DDIs with capecitabine identified: Pantoprazole: Proton Pump Inhibitors (PPI) may diminish the therapeutic effect of capecitabine, varying information on the clinical impact. Recommend evaluating the need for a PPI/acid suppression. If acid suppression is needed, attempt switching to a H2 antagonist (eg, famotidine) if possible.  Evaluated chart and no patient barriers to medication adherence identified.   Prescription has been e-scribed to the [facility] for benefits analysis and approval.  Oral Oncology Clinic will continue to follow for insurance authorization, copayment issues, initial counseling and start date.   Remi Haggard, PharmD, BCPS, BCOP, CPP Hematology/Oncology Clinical Pharmacist Practitioner [place] [phone]  12/05/2022 10:29 AM

## 2022-12-06 ENCOUNTER — Other Ambulatory Visit: Payer: Self-pay

## 2022-12-06 ENCOUNTER — Other Ambulatory Visit (HOSPITAL_COMMUNITY): Payer: Self-pay

## 2022-12-06 NOTE — Telephone Encounter (Signed)
Clinical Pharmacist Practitioner Encounter   [facility] (Specialty) will deliver medication to patient on 12/11/22.  Patient Education I spoke with patient for overview of new oral chemotherapy medication: Xeloda (capecitabine) for the adjuvant treatment of breast cancer, triple negative, in conjunction with pembrolizumab, planned duration of 8 capecitabine cycles.   Counseled patient on administration, dosing, side effects, monitoring, drug-food interactions, safe handling, storage, and disposal. Patient will take 3 tablets (1,500 mg total) by mouth 2 (two) times daily after a meal. Take for 14 days, then hold for 7 days. Repeat every 21 days.   Side effects include but not limited to: diarrhea, hand-foot syndrome, mouth sores, edema, decreased wbc, fatigue, N/V Diarrhea: patient knows to use loperamide as needed and call the office if she is having 4 or more loose stools per day Hand-foot syndrome: recommended the use of [place]derly Smooth Extra Care 20 Mouth sores: patient will request magic mouthwash if needed    Reviewed with patient importance of keeping a medication schedule and plan for any missed doses.  After discussion with patient no patient barriers to medication adherence identified.   Marilyn Ford voiced understanding and appreciation. All questions answered. Medication handout provided.  Provided patient with Oral Chemotherapy Navigation Clinic phone number. Patient knows to call the office with questions or concerns. Oral Chemotherapy Navigation Clinic will continue to follow.  Remi Haggard, PharmD, BCPS, BCOP, CPP Hematology/Oncology Clinical Pharmacist Practitioner [place] [phone]  12/06/2022 4:04 PM

## 2022-12-06 NOTE — Telephone Encounter (Signed)
Patient successfully OnBoarded and drug education provided by pharmacist. Medication scheduled to be shipped on 10/10/22 for delivery on 10/11/22 from [facility] to patient's address. Patient also knows to call me at [phone] with any questions or concerns regarding receiving medication or if there is any unexpected change in co-pay.    Ardeen Fillers, CPhT Oncology Pharmacy Patient Advocate  [facility]  [phone] (phone) [phone] (fax) 12/06/2022 4:11 PM

## 2022-12-07 ENCOUNTER — Other Ambulatory Visit: Payer: Self-pay

## 2022-12-07 ENCOUNTER — Other Ambulatory Visit (HOSPITAL_COMMUNITY): Payer: Self-pay

## 2022-12-10 NOTE — Progress Notes (Signed)
Radiation Oncology         [phone] ________________________________  Outpatient Follow Up - Conducted via telephone at patient request.  I spoke with the patient to conduct this visit via telephone. The patient was notified in advance and was offered an in person or telemedicine meeting to allow for face to face communication but instead preferred to proceed with a telephone visit.   Name: Marilyn Ford        MRN: [id number]  Date of Service: 12/13/2022 DOB: [birth year]  [id number], Broadus John, MD  [name], MD     REFERRING PHYSICIAN: [name], MD   DIAGNOSIS: The encounter diagnosis was Malignant neoplasm of overlapping sites of right breast in female, estrogen receptor negative (HCC).   HISTORY OF PRESENT ILLNESS: Marilyn Ford is a 62 y.o. female seen for a diagnosis of right breast cancer. The patient was being followed for calcifications in the right breast.  She had a biopsy in October 2022 that showed benign calcifications though this was felt to be discordant.  She was offered surgery but rather has been followed with diagnostic imaging.  A diagnostic mammogram on 02/20/2022 showed suspicious changes of the skin of the right breast with asymmetry in the central right breast.  By ultrasound the skin thickening and subcutaneous edema throughout the right breast was seen and 1 cm intramammary lymph node with nodular cortical thickening was seen in the 9:00 right breast.  This corresponded to the mass seen on mammography.  The entire upper and lower central right breast was scanned and no definitive masses or other sonographic abnormalities were seen.  Targeted ultrasound of the right axilla showed at least 3 morphologically abnormal appearing lymph nodes with massive cortical thickening.  She underwent biopsy that same day, and while the intramammary lymph node  was negative for carcinoma, her right axillary node that was sampled was a grade 3 carcinoma  consistent with breast primary.  Her prognostic panel is pending. She did have an MRI of the breasts performed on 03/02/2022 this did show a 3.8 cm area of clumped non-mass enhancement with evidence of biopsy marking clip just below the level of this there was diffuse skin thickening of the right breast with skin enhancement.  Numerous bulky matted lymph nodes in the right axilla were also identified.  No abnormalities were seen in the left breast.  A punch biopsy on 03/19/22 showed benign breast tissue with fibrocystic change, stromal fibrosis adenosis and usual ductal hyperplasia.  No malignancy was identified.  A PET scan on 03/22/2022 showed hypermetabolic right axillary and subpectoral lymphadenopathy with no other definite signs of disease.  She was counseled on the rationale for directly proceeding with neoadjuvant chemotherapy and Keytruda which began on 04/02/2022 and concluded on 09/27/2022.  She underwent a right mastectomy with right ALND on 11/01/2022 which showed fibrosis of the right breast with mild inflammation focal atypical ductal hyperplasia negative for residual invasive cancer 1 lymph node in the specimen was negative and all surgical margins were negative.  6 sampled lymph nodes in total were reviewed 1 contained metastatic disease greater than 2 mm and 1 showed micrometastasis.  She began oral capecitabine on 12/04/2022 which she continues and is seen to discuss adjuvant radiotherapy to the right chest wall and regional lymph nodes.    PREVIOUS RADIATION THERAPY: No   PAST MEDICAL HISTORY:  Past Medical History:  Diagnosis Date   Allergy    Anxiety    Breast cancer (HCC)  Diabetes mellitus without complication (HCC)    GERD (gastroesophageal reflux disease)    Heart murmur    as a child   History of kidney stones    Hypertension    Pneumonia    Stroke [facility])    Vaginal Pap smear, abnormal        PAST SURGICAL HISTORY: Past Surgical History:  Procedure Laterality Date    BREAST BIOPSY  10/31/2022   [place] RT RADIOACTIVE SEED LOC 10/31/2022 [facility] MAMMOGRAPHY   CESAREAN SECTION     CHOLECYSTECTOMY     COLONOSCOPY WITH PROPOFOL N/A 07/19/2020   Procedure: COLONOSCOPY WITH PROPOFOL;  Surgeon: Dolores Frame, MD;  Location: [facility];  Service: Gastroenterology;  Laterality: N/A;  AM   POLYPECTOMY  07/19/2020   Procedure: POLYPECTOMY;  Surgeon: Dolores Frame, MD;  Location: [facility];  Service: Gastroenterology;;   PORTACATH PLACEMENT N/A 03/20/2022   Procedure: INSERTION PORT-A-CATH;  Surgeon: Abigail Miyamoto, MD;  Location: [facility];  Service: General;  Laterality: N/A;   RADIOACTIVE SEED GUIDED AXILLARY SENTINEL LYMPH NODE Right 11/01/2022   Procedure: RADIOACTIVE SEED GUIDED RIGHT AXILLARY SENTINEL LYMPH NODE DISSECTION;  Surgeon: Abigail Miyamoto, MD;  Location: [facility];  Service: General;  Laterality: Right;   SIMPLE MASTECTOMY WITH AXILLARY SENTINEL NODE BIOPSY Right 11/01/2022   Procedure: RIGHT SIMPLE MASTECTOMY;  Surgeon: Abigail Miyamoto, MD;  Location: [facility];  Service: General;  Laterality: Right;     FAMILY HISTORY:  Family History  Problem Relation Age of Onset   Diabetes Mother    Uterine cancer Mother 102 - 51   Diabetes Brother    Breast cancer Maternal Aunt        dx >50, d. from cancer   Cancer Maternal Grandmother        unk type, "back cancer?"     SOCIAL HISTORY:  reports that she quit smoking about 7 years ago. Her smoking use included cigarettes. She started smoking about 23 years ago. She has a 12.5 pack-year smoking history. She has never used smokeless tobacco. She reports current alcohol use of about 2.0 - 3.0 standard drinks of alcohol per week. She reports that she does not use drugs.  The patient is married and [place]   ALLERGIES: Penicillins and Sulfa antibiotics   MEDICATIONS:  Current Outpatient Medications  Medication Sig  Dispense Refill   Alcohol Swabs (B-D SINGLE USE SWABS REGULAR) PADS Test BS daily and as needed Dx E11.9 100 each 3   aluminum-magnesium hydroxide-simethicone (MAALOX) 200-200-20 MG/5ML SUSP Take 30 mLs by mouth 4 (four) times daily -  before meals and at bedtime. 480 mL 2   amLODipine (NORVASC) 10 MG tablet 1 tablet daily 90 tablet 3   Apple Cider Vinegar 500 MG TABS Take 500 mg by mouth in the morning.     aspirin 81 MG EC tablet Take 1 tablet (81 mg total) by mouth daily. 30 tablet 0   atorvastatin (LIPITOR) 80 MG tablet TAKE 1 TABLET EVERY DAY AT 6PM 90 tablet 3   Blood Glucose Calibration (TRUE METRIX LEVEL 1) Low SOLN Use with glucometer Dx E11.9 3 each 0   Blood Glucose Monitoring Suppl (TRUE METRIX AIR GLUCOSE METER) w/Device KIT Test BS daily and as needed Dx E11.9 1 kit 0   Calcium Carb-Cholecalciferol (CALCIUM-VITAMIN D) 600-400 MG-UNIT TABS Take 1 tablet by mouth in the morning.     capecitabine (XELODA) 500 MG tablet Take 3 tablets (1,500  mg total) by mouth 2 (two) times daily after a meal. Take for 14 days, then hold for 7 days. Repeat every 21 days. 84 tablet 7   Coenzyme Q10 (COQ10) 100 MG CAPS Take 100 mg by mouth in the morning.     Cranberry 425 MG CAPS Take 425 mg by mouth in the morning and at bedtime.     diphenoxylate-atropine (LOMOTIL) 2.5-0.025 MG tablet Take 1 tablet by mouth 4 (four) times daily as needed for diarrhea or loose stools. 30 tablet 0   Dulaglutide (TRULICITY) 1.5 MG/0.5ML SOPN Inject content of one pen under the skin weekly 6 mL 3   Flaxseed, Linseed, (FLAXSEED OIL) 1000 MG CAPS Take 1,000 mg by mouth in the morning.     glucose blood (TRUE METRIX BLOOD GLUCOSE TEST) test strip Test BS daily and as needed Dx E11.9 100 each 3   icosapent Ethyl (VASCEPA) 1 g capsule Take 2 g by mouth 2 (two) times daily.     Krill Oil 500 MG CAPS Take 3 capsules (1,500 mg total) by mouth in the morning and at bedtime.     lidocaine (XYLOCAINE) 2 % solution Use as directed 15  mLs in the mouth or throat every 6 (six) hours as needed for mouth pain. 250 mL 1   lidocaine (XYLOCAINE) 5 % ointment Apply 1 Application topically as needed. 35.44 g 0   LORazepam (ATIVAN) 1 MG tablet Take 1 tablet (1 mg total) by mouth 2 (two) times daily as needed for anxiety. 10 tablet 0   magnesium oxide (MAG-OX) 400 (240 Mg) MG tablet TAKE 1 TABLET BY MOUTH TWICE A DAY 60 tablet 1   meloxicam (MOBIC) 15 MG tablet TAKE 1 TABLET (15 MG TOTAL) BY MOUTH DAILY. FOR JOINT AND MUSCLE PAIN 90 tablet 1   metoprolol (TOPROL-XL) 200 MG 24 hr tablet TAKE 1 TABLET ONE TIME DAILY, WITH OR IMMEDIATELY FOLLOWING A MEAL 90 tablet 3   Multiple Vitamin (MULTIVITAMIN) capsule Take 1 capsule by mouth in the morning.     ondansetron (ZOFRAN) 8 MG tablet Take 1 tablet (8 mg total) by mouth every 8 (eight) hours as needed for nausea or vomiting. 30 tablet 3   pantoprazole (PROTONIX) 40 MG tablet TAKE 1 TABLET twice daily FOR STOMACH 180 tablet 3   traZODone (DESYREL) 150 MG tablet TAKE 1 OR 2 TABLETS AT BEDTIME FOR SLEEP 180 tablet 3   TRUEplus Lancets 33G MISC Test BS daily and as needed Dx E11.9 100 each 3   valsartan (DIOVAN) 320 MG tablet Take 1 tablet (320 mg total) by mouth daily. For blood pressure. 90 tablet 1   zinc gluconate 50 MG tablet Take 50 mg by mouth daily.     Zinc Oxide 20 % PSTE Apply 1 Application topically 3 (three) times daily. 57 g 1   No current facility-administered medications for this visit.     REVIEW OF SYSTEMS: On review of systems, the patient reports that she is doing well since chemotherapy and since her surgery. She just started capecitabine today. She is unsure if Dr. Ellin Saba wants to delay her radiation due to this. No other complaints are verbalized.     PHYSICAL EXAM:  Unable to assess due to encounter type.  ECOG = 0  0 - Asymptomatic (Fully active, able to carry on all predisease activities without restriction)  1 - Symptomatic but completely ambulatory  (Restricted in physically strenuous activity but ambulatory and able to carry out work of a light  or sedentary nature. For example, light housework, office work)  2 - Symptomatic, <50% in bed during the day (Ambulatory and capable of all self care but unable to carry out any work activities. Up and about more than 50% of waking hours)  3 - Symptomatic, >50% in bed, but not bedbound (Capable of only limited self-care, confined to bed or chair 50% or more of waking hours)  4 - Bedbound (Completely disabled. Cannot carry on any self-care. Totally confined to bed or chair)  5 - Death   Santiago Glad MM, Creech RH, Tormey DC, et al. [id number]). "Toxicity and response criteria of the [facility] Group". Am. Evlyn Clines. Oncol. 5 (6): 649-55    LABORATORY DATA:  Lab Results  Component Value Date   WBC 3.2 (L) 12/04/2022   HGB 10.2 (L) 12/04/2022   HCT 30.1 (L) 12/04/2022   MCV 103.8 (H) 12/04/2022   PLT 105 (L) 12/04/2022   Lab Results  Component Value Date   NA 133 (L) 12/04/2022   K 3.6 12/04/2022   CL 103 12/04/2022   CO2 23 12/04/2022   Lab Results  Component Value Date   ALT 21 12/04/2022   AST 18 12/04/2022   ALKPHOS 97 12/04/2022   BILITOT 0.8 12/04/2022      RADIOGRAPHY: No results found.     IMPRESSION/PLAN: 1. 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 Dr. Mitzi Hansen has reviewed her final pathology findings and today we discussed the course since her last visit including that she is now finished with chemotherapy as well as surgical resection.  She will continue on oral capecitabine under the care of Dr. Ellin Saba.  We discussed the rationale for external radiotherapy to the right chest wall as well as her regional lymph nodes to reduce risks of local recurrence. We discussed the risks, benefits, short, and long term effects of radiotherapy, as well as the curative  intent, and the patient is interested in proceeding.  I reviewed the delivery and logistics of radiotherapy and that Dr. Mitzi Hansen recommends 6 1/2 weeks of radiotherapy to the right chest wall as well as lymph nodes. She is interested in treatment closer to home and will be referred to [facility] to have treatment there. She is going to take capecitabine and the timing of starting radiation was up for discussion with medical oncology because of this.    This encounter was conducted via telephone.  The patient has provided two factor identification and has given verbal consent for this type of encounter and has been advised to only accept a meeting of this type in a secure network environment. The time spent during this encounter was 45 minutes including preparation, discussion, and coordination of the patient's care. The attendants for this meeting include Ronny Bacon  and [name].  During the encounter,   Ronny Bacon was located at [facility] Radiation Oncology Department.  TRISTIN DEMO was located at home.       Osker Mason, [facility]    **Disclaimer: This note was dictated with voice recognition software. Similar sounding words can inadvertently be transcribed and this note may contain transcription errors which may not have been corrected upon publication of note.**

## 2022-12-11 ENCOUNTER — Encounter: Payer: Self-pay | Admitting: Physical Therapy

## 2022-12-11 ENCOUNTER — Ambulatory Visit: Payer: No Typology Code available for payment source | Attending: Surgery | Admitting: Physical Therapy

## 2022-12-11 DIAGNOSIS — M25611 Stiffness of right shoulder, not elsewhere classified: Secondary | ICD-10-CM | POA: Diagnosis not present

## 2022-12-11 DIAGNOSIS — R293 Abnormal posture: Secondary | ICD-10-CM | POA: Diagnosis not present

## 2022-12-11 DIAGNOSIS — C50811 Malignant neoplasm of overlapping sites of right female breast: Secondary | ICD-10-CM | POA: Insufficient documentation

## 2022-12-11 DIAGNOSIS — Z483 Aftercare following surgery for neoplasm: Secondary | ICD-10-CM | POA: Insufficient documentation

## 2022-12-11 DIAGNOSIS — Z171 Estrogen receptor negative status [ER-]: Secondary | ICD-10-CM | POA: Insufficient documentation

## 2022-12-11 DIAGNOSIS — M25511 Pain in right shoulder: Secondary | ICD-10-CM | POA: Diagnosis not present

## 2022-12-11 NOTE — Progress Notes (Signed)
Pharmacist Chemotherapy Monitoring - Initial Assessment    Anticipated start date: 12/12/22   The following has been reviewed per standard work regarding the patient's treatment regimen: The patient's diagnosis, treatment plan and drug doses, and organ/hematologic function Lab orders and baseline tests specific to treatment regimen  The treatment plan start date, drug sequencing, and pre-medications Prior authorization status  Patient's documented medication list, including drug-drug interaction screen and prescriptions for anti-emetics and supportive care specific to the treatment regimen The drug concentrations, fluid compatibility, administration routes, and timing of the medications to be used The patient's access for treatment and lifetime cumulative dose history, if applicable  The patient's medication allergies and previous infusion related reactions, if applicable   Changes made to treatment plan:  N/A  Follow up needed:  N/A   Stephens Shire, [facility], 12/11/2022  2:16 PM

## 2022-12-11 NOTE — Therapy (Signed)
OUTPATIENT PHYSICAL THERAPY BREAST CANCER POST OP FOLLOW UP   Patient Name: Marilyn Ford MRN: [id number] DOB:1960-09-15, 62 y.o., female Today's Date: 12/11/2022  END OF SESSION:  PT End of Session - 12/11/22 0804     Visit Number 3    Number of Visits 10    Date for PT Re-Evaluation 12/19/22    PT Start Time 0803    PT Stop Time 0841    PT Time Calculation (min) 38 min    Activity Tolerance Other (comment)   limited by guarding   Behavior During Therapy [facility] for tasks assessed/performed             Past Medical History:  Diagnosis Date   Allergy    Anxiety    Breast cancer (HCC)    Diabetes mellitus without complication (HCC)    GERD (gastroesophageal reflux disease)    Heart murmur    as a child   History of kidney stones    Hypertension    Pneumonia    Stroke [facility])    Vaginal Pap smear, abnormal    Past Surgical History:  Procedure Laterality Date   BREAST BIOPSY  10/31/2022   [place] RT RADIOACTIVE SEED LOC 10/31/2022 [facility]MO[facility]   CESAREAN SECTION     CHOLECYSTECTOMY     COLONOSCOPY WITH PROPOFOL N/A 07/19/2020   Procedure: COLONOSCOPY WITH PROPOFOL;  Surgeon: Dolores Frame, MD;  [facility]: [facility];  Service: Gastroenterology;  Laterality: N/A;  AM   POLYPECTOMY  07/19/2020   Procedure: POLYPECTOMY;  Surgeon: Dolores Frame, MD;  Location: [facility];  Service: Gastroenterology;;   PORTACATH PLACEMENT N/A 03/20/2022   Procedure: INSERTION PORT-A-CATH;  Surgeon: Abigail Miyamoto, MD;  Location: [facility];  Service: General;  Laterality: N/A;   RADIOACTIVE SEED GUIDED AXILLARY SENTINEL LYMPH NODE Right 11/01/2022   Procedure: RADIOACTIVE SEED GUIDED RIGHT AXILLARY SENTINEL LYMPH NODE DISSECTION;  Surgeon: Abigail Miyamoto, MD;  Location: [facility];  Service: General;  Laterality: Right;   SIMPLE MASTECTOMY WITH AXILLARY SENTINEL NODE BIOPSY Right 11/01/2022   Procedure: RIGHT SIMPLE MASTECTOMY;  Surgeon: Abigail Miyamoto, MD;  Location: [facility];  Service: General;  Laterality: Right;   Patient Active Problem List   Diagnosis Date Noted   S/P mastectomy, right 11/01/2022   Genetic testing 06/05/2022   Neutropenia, drug-induced (HCC) 05/28/2022   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) 03/05/2022   Encounter for screening fecal occult blood testing 03/01/2022   Routine cervical smear 03/01/2022   Axillary adenopathy 02/20/2022   Mass of lower outer quadrant of right breast 01/30/2022   Cat bite of index finger 09/12/2021   Gastroesophageal reflux disease without esophagitis 10/20/2019   GAD (generalized anxiety disorder) 10/20/2019   Insomnia due to medical condition 10/20/2019   Hemiparesis affecting right side as late effect of cerebrovascular accident (CVA) (HCC) 04/22/2015   Diabetes mellitus type II, controlled (HCC) 12/23/2014   Hypertension 12/23/2014    PCP: Mechele Claude, MD   REFERRING PROVIDER: Abigail Miyamoto, MD  REFERRING DIAG:  C50.811,Z17.1 (ICD-10-CM) - Malignant neoplasm of overlapping sites of right breast in female, estrogen receptor negative (HCC)   THERAPY DIAG:  Stiffness of right shoulder, not elsewhere classified  Acute pain of right shoulder  Aftercare following surgery for neoplasm  Abnormal posture  Malignant neoplasm of overlapping sites of right breast in female, estrogen receptor negative (HCC)  Rationale for Evaluation and Treatment: Rehabilitation  ONSET DATE: 02/20/22  SUBJECTIVE:                                                                                                                                                                                           SUBJECTIVE STATEMENT: I cleaned windows with my R arm and I did ok. I am supposed to start radiation on Friday.   PERTINENT HISTORY:  Patient was diagnosed on 02/20/22 with right grade 3. It measures 3.8 cm and is located in the inner lower  quadrant. 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  2 nodes taken at biopsy - 1 was negative the other was positive. Had blood transfusion on 10/05/22. Plan is to undergo a R simple mastectomy and SLNB on 11/01/22. Hx of DVT and stroke   PATIENT GOALS:  Reassess how my recovery is going related to arm function, pain, and swelling.  PAIN:  Are you having pain? No  PRECAUTIONS: Recent Surgery, right UE Lymphedema risk, Other: hx of stroke and DVT  RED FLAGS: None   ACTIVITY LEVEL / LEISURE: pt reports she has been going up and down steps, planting   OBJECTIVE:   PATIENT SURVEYS:  QUICK DASH:     OBSERVATIONS: Healing mastectomy scar no signs of edema  POSTURE:  Forward head and rounded shoulders posture   UPPER EXTREMITY AROM/PROM:   A/PROM RIGHT   eval   RIGHT 11/21/22 RIGHT 12/11/22  Shoulder extension 68 59 76  Shoulder flexion 156 101 140  Shoulder abduction 180 105 160  Shoulder internal rotation 67 75   Shoulder external rotation 84 77                           (Blank rows = not tested)   A/PROM LEFT   eval  Shoulder extension 71  Shoulder flexion 155  Shoulder abduction 170  Shoulder internal rotation 58  Shoulder external rotation 93                          (Blank rows = not tested)   CERVICAL AROM: All within normal limits:      Percent limited  Flexion WFL  Extension WFL  Right lateral flexion 25% limited  Left lateral flexion WFL  Right rotation WFL  Left rotation WFL      UPPER EXTREMITY STRENGTH: 5/5   LYMPHEDEMA ASSESSMENTS:    LANDMARK RIGHT   eval RIGHT 11/21/22  10 cm proximal to olecranon process 24.2 26.1  Olecranon process 21.9 22.9  10  cm proximal to ulnar styloid process 19 19.7  Just proximal to ulnar styloid process 14.7 14.9  Across hand at thumb web space 17.7 18.5  At base of 2nd digit 6 6.1  (Blank rows = not tested)   LANDMARK LEFT   eval   10 cm proximal to olecranon process 25.2  Olecranon process 22  10 cm proximal to ulnar styloid process 18.7  Just proximal to ulnar styloid process 14.5  Across hand at thumb web space 18.5  At base of 2nd digit 5.9  (Blank rows = not tested)  Surgery type/Date: 11/01/22: R mastectomy and ALND Number of lymph nodes removed: 1/6 Current/past treatment (chemo, radiation, hormone therapy): completed neoadjuvant chemo, waiting to find out about radiation  Other symptoms:  Heaviness/tightness Yes Pain No Pitting edema No Infections No Decreased scar mobility Yes still healing Stemmer sign No  TREATMENT PERFORMED: 12/11/22 Pulleys x 2 min in direction of flexion and 2 min in direction of abduction with v/c to keep shoulders relaxed to avoid scapular compensation Ball x 5 reps in to flexion - stopped due to fatigue, unable to do abduction Attempted PROM to R shoulder but pt very guarded and unable to relax Attempted sidelying R scapular mobs but pt unable to relax  MLD to R trunk as follows (therapist donned gloves per pt request due to therapist hands were cold): R inguinal nodes and establishment of axillo inguinal pathway spending extra time at area just inferior to axilla 11/21/22: PROM to R Shoulder into flexion and abduction with numerous v/c for pt relax - pt very guarded so stopped and did pulleys x 2 min in direction of flexion and 2 min in abduction with pt demonstrating ability to relax with this  PATIENT EDUCATION:  Education details: ABC class, importance of doing post op stretches, posture and need for scapular retraction Person educated: Patient and Spouse Education method: Explanation, Demonstration, and Tactile cues Education comprehension: verbalized understanding and returned demonstration  HOME EXERCISE PROGRAM: Reviewed previously given post op HEP.   ASSESSMENT:  CLINICAL IMPRESSION: Remeasured pt's ROM and it has improved greatly since last session. Pt reports  she has not been exercising but has been using the arm more frequently at home. She is still very guarded throughout PT and was unable to tolerate PROM or scapular mobs. Began MLD today to R trunk since pt has discomfort in this area. Encouraged pt to continue to wear compression to help manage this. Educated pt about need to become less guarded and try and relax throughout the day.   Pt will benefit from skilled therapeutic intervention to improve on the following deficits: Decreased knowledge of precautions, impaired UE functional use, pain, decreased ROM, postural dysfunction.   PT treatment/interventions: ADL/Self care home management, Therapeutic exercises, Therapeutic activity, Patient/Family education, Self Care, Joint mobilization, Orthotic/Fit training, Manual lymph drainage, scar mobilization, Taping, Manual therapy, and Re-evaluation   GOALS: Goals reviewed with patient? Yes  LONG TERM GOALS:  (STG=LTG)  GOALS Name Target Date  Goal status  1 Pt will demonstrate she has regained full shoulder ROM and function post operatively compared to baselines.  Baseline: 12/19/22 INITIAL  2 Pt will demonstrate 170 degrees of R shoulder abduction to allow her to reach out to the side. 12/19/22 INITIAL  3 Pt will demonstrate 150 degrees of R shoulder flexion to allow her to reach overhead. 12/19/22 INITIAL  4 Pt will be independent in a home exercise program for continued stretching and strengthening. 12/19/22 INITIAL  PLAN:  PT FREQUENCY/DURATION: 2x/wk for 4 wks  PLAN FOR NEXT SESSION: pulleys, ball, PROM to R shoulder, scap retractions    [facility]  [address], [address]  [place] [address]  [phone]  After Breast Cancer Class It is recommended you attend the ABC class to be educated on lymphedema risk reduction. This class is free of charge and lasts for 1 hour. It is a 1-time class. You will need to download the TEAMS app either on your phone or  computer. We will send you a link the night before or the morning of the class. You should be able to click on that link to join the class. This is not a confidential class. You don't have to turn your camera on, but other participants may be able to see your email address.  Scar massage You can begin gentle scar massage to you incision sites. Gently place one hand on the incision and move the skin (without sliding on the skin) in various directions. Do this for a few minutes and then you can gently massage either coconut oil or vitamin E cream into the scars.  Compression garment You should continue wearing your compression bra until you feel like you no longer have swelling.  Home exercise Program Continue doing the exercises you were given until you feel like you can do them without feeling any tightness at the end.   Walking Program Studies show that 30 minutes of walking per day (fast enough to elevate your heart rate) can significantly reduce the risk of a cancer recurrence. If you can't walk due to other medical reasons, we encourage you to find another activity you could do (like a stationary bike or water exercise).  Posture After breast cancer surgery, people frequently sit with rounded shoulders posture because it puts their incisions on slack and feels better. If you sit like this and scar tissue forms in that position, you can become very tight and have pain sitting or standing with good posture. Try to be aware of your posture and sit and stand up tall to heal properly.  Follow up PT: It is recommended you return every 3 months for the first 3 years following surgery to be assessed on the SOZO machine for an L-Dex score. This helps prevent clinically significant lymphedema in 95% of patients. These follow up screens are 10 minute appointments that you are not billed for.  [employer], PT 12/11/2022, 9:02 AM

## 2022-12-12 ENCOUNTER — Inpatient Hospital Stay: Payer: No Typology Code available for payment source | Attending: Hematology and Oncology

## 2022-12-12 ENCOUNTER — Inpatient Hospital Stay: Payer: No Typology Code available for payment source

## 2022-12-12 VITALS — BP 134/81 | HR 83 | Temp 96.7°F | Resp 18 | Wt 137.2 lb

## 2022-12-12 DIAGNOSIS — C773 Secondary and unspecified malignant neoplasm of axilla and upper limb lymph nodes: Secondary | ICD-10-CM | POA: Insufficient documentation

## 2022-12-12 DIAGNOSIS — C50811 Malignant neoplasm of overlapping sites of right female breast: Secondary | ICD-10-CM | POA: Insufficient documentation

## 2022-12-12 DIAGNOSIS — Z79620 Long term (current) use of immunosuppressive biologic: Secondary | ICD-10-CM | POA: Insufficient documentation

## 2022-12-12 DIAGNOSIS — Z5112 Encounter for antineoplastic immunotherapy: Secondary | ICD-10-CM | POA: Diagnosis not present

## 2022-12-12 LAB — CBC WITH DIFFERENTIAL/PLATELET
Abs Immature Granulocytes: 0 10*3/uL (ref 0.00–0.07)
Basophils Absolute: 0 10*3/uL (ref 0.0–0.1)
Basophils Relative: 1 %
Eosinophils Absolute: 0.1 10*3/uL (ref 0.0–0.5)
Eosinophils Relative: 2 %
HCT: 28.6 % — ABNORMAL LOW (ref 36.0–46.0)
Hemoglobin: 9.7 g/dL — ABNORMAL LOW (ref 12.0–15.0)
Immature Granulocytes: 0 %
Lymphocytes Relative: 22 %
Lymphs Abs: 0.7 10*3/uL (ref 0.7–4.0)
MCH: 35 pg — ABNORMAL HIGH (ref 26.0–34.0)
MCHC: 33.9 g/dL (ref 30.0–36.0)
MCV: 103.2 fL — ABNORMAL HIGH (ref 80.0–100.0)
Monocytes Absolute: 0.3 10*3/uL (ref 0.1–1.0)
Monocytes Relative: 10 %
Neutro Abs: 2.2 10*3/uL (ref 1.7–7.7)
Neutrophils Relative %: 65 %
Platelets: 126 10*3/uL — ABNORMAL LOW (ref 150–400)
RBC: 2.77 MIL/uL — ABNORMAL LOW (ref 3.87–5.11)
RDW: 13.8 % (ref 11.5–15.5)
WBC: 3.3 10*3/uL — ABNORMAL LOW (ref 4.0–10.5)
nRBC: 0 % (ref 0.0–0.2)

## 2022-12-12 LAB — COMPREHENSIVE METABOLIC PANEL
ALT: 24 U/L (ref 0–44)
AST: 20 U/L (ref 15–41)
Albumin: 3.7 g/dL (ref 3.5–5.0)
Alkaline Phosphatase: 93 U/L (ref 38–126)
Anion gap: 10 (ref 5–15)
BUN: 20 mg/dL (ref 8–23)
CO2: 25 mmol/L (ref 22–32)
Calcium: 8.9 mg/dL (ref 8.9–10.3)
Chloride: 101 mmol/L (ref 98–111)
Creatinine, Ser: 1.02 mg/dL — ABNORMAL HIGH (ref 0.44–1.00)
GFR, Estimated: >60 mL/min (ref >60)
Glucose, Bld: 144 mg/dL — ABNORMAL HIGH (ref 70–99)
Potassium: 3.6 mmol/L (ref 3.5–5.1)
Sodium: 136 mmol/L (ref 135–145)
Total Bilirubin: 0.7 mg/dL (ref 0.3–1.2)
Total Protein: 6.4 g/dL — ABNORMAL LOW (ref 6.5–8.1)

## 2022-12-12 LAB — TSH: TSH: 2.513 u[IU]/mL (ref 0.350–4.500)

## 2022-12-12 MED ORDER — SODIUM CHLORIDE 0.9% FLUSH
10.0000 mL | Freq: Once | INTRAVENOUS | Status: AC
  Administered 2022-12-12: 10 mL via INTRAVENOUS

## 2022-12-12 MED ORDER — SODIUM CHLORIDE 0.9 % IV SOLN
200.0000 mg | Freq: Once | INTRAVENOUS | Status: AC
  Administered 2022-12-12: 200 mg via INTRAVENOUS
  Filled 2022-12-12: qty 8

## 2022-12-12 MED ORDER — SODIUM CHLORIDE 0.9 % IV SOLN: Freq: Once | INTRAVENOUS | Status: AC

## 2022-12-12 NOTE — Patient Instructions (Signed)
[facility] [place]  Discharge Instructions: Thank you for choosing [facility] to provide your oncology and hematology care.  If you have a lab appointment with the Cancer Center - please note that after April 8th, 2024, all labs will be drawn in the cancer center.  You do not have to check in or register with the main entrance as you have in the past but will complete your check-in in the cancer center.  Wear comfortable clothing and clothing appropriate for easy access to any Portacath or PICC line.   We strive to give you quality time with your provider. You may need to reschedule your appointment if you arrive late (15 or more minutes).  Arriving late affects you and other patients whose appointments are after yours.  Also, if you miss three or more appointments without notifying the office, you may be dismissed from the clinic at the provider's discretion.      For prescription refill requests, have your pharmacy contact our office and allow 72 hours for refills to be completed.    Today you received the following chemotherapy and/or immunotherapy agents Keytruda      To help prevent nausea and vomiting after your treatment, we encourage you to take your nausea medication as directed.  BELOW ARE SYMPTOMS THAT SHOULD BE REPORTED IMMEDIATELY: *FEVER GREATER THAN 100.4 F (38 C) OR HIGHER *CHILLS OR SWEATING *NAUSEA AND VOMITING THAT IS NOT CONTROLLED WITH YOUR NAUSEA MEDICATION *UNUSUAL SHORTNESS OF BREATH *UNUSUAL BRUISING OR BLEEDING *URINARY PROBLEMS (pain or burning when urinating, or frequent urination) *BOWEL PROBLEMS (unusual diarrhea, constipation, pain near the anus) TENDERNESS IN MOUTH AND THROAT WITH OR WITHOUT PRESENCE OF ULCERS (sore throat, sores in mouth, or a toothache) UNUSUAL RASH, SWELLING OR PAIN  UNUSUAL VAGINAL DISCHARGE OR ITCHING   Items with * indicate a potential emergency and should be followed up as soon as possible or go to the  Emergency Department if any problems should occur.  Please show the CHEMOTHERAPY ALERT CARD or IMMUNOTHERAPY ALERT CARD at check-in to the Emergency Department and triage nurse.  Should you have questions after your visit or need to cancel or reschedule your appointment, please contact [facility] [phone]  and follow the prompts.  Office hours are 8:00 a.m. to 4:30 p.m. Monday - Friday. Please note that voicemails left after 4:00 p.m. may not be returned until the following business day.  We are closed weekends and major holidays. You have access to a nurse at all times for urgent questions. Please call the main number to the clinic [phone] and follow the prompts.  For any non-urgent questions, you may also contact your provider using MyChart. We now offer e-Visits for anyone 18 and older to request care online for non-urgent symptoms. For details visit [email or web address].   Also download the MyChart app! Go to the app store, search "MyChart", open the app, select [place], and log in with your MyChart username and password.   

## 2022-12-13 ENCOUNTER — Ambulatory Visit
Admission: RE | Admit: 2022-12-13 | Discharge: 2022-12-13 | Disposition: A | Discharge status: Home / Self Care | Payer: No Typology Code available for payment source | Source: Ambulatory Visit | Attending: Radiation Oncology | Admitting: Radiation Oncology

## 2022-12-13 ENCOUNTER — Telehealth: Payer: Self-pay | Admitting: *Deleted

## 2022-12-13 ENCOUNTER — Ambulatory Visit: Payer: No Typology Code available for payment source | Admitting: Physical Therapy

## 2022-12-13 ENCOUNTER — Encounter: Payer: Self-pay | Admitting: Radiation Oncology

## 2022-12-13 VITALS — Ht 62.0 in | Wt 137.0 lb

## 2022-12-13 DIAGNOSIS — C50811 Malignant neoplasm of overlapping sites of right female breast: Secondary | ICD-10-CM | POA: Diagnosis not present

## 2022-12-13 DIAGNOSIS — Z171 Estrogen receptor negative status [ER-]: Secondary | ICD-10-CM | POA: Diagnosis not present

## 2022-12-13 NOTE — Progress Notes (Signed)
Nursing interview for Malignant neoplasm of overlapping sites of right breast in female, estrogen receptor negative (HCC).  Patient identity verified x2.  Patient reports RT breast tenderness 2/10 at incision line. Patient is healing well. No other issues conveyed at this time.  Meaningful use complete.  Vitals- Ht 5\' 2"  (1.575 m)   Wt 137 lb (62.1 kg)   BMI 25.06 kg/m   This concludes the interaction.  Ruel Favors, LPN

## 2022-12-13 NOTE — Telephone Encounter (Signed)
CALLED PATIENT TO INFORM THAT [facility] DOESN'T ACCEPT HER INSURANCE AND THAT HER SIMULATION NEEDS TO BE RESCHEDULED, SPOKE WITH PATIENT AND SHE IS AWARE OF THIS AND IS GOOD WITH THIS

## 2022-12-14 ENCOUNTER — Ambulatory Visit: Payer: No Typology Code available for payment source | Admitting: Radiation Oncology

## 2022-12-17 ENCOUNTER — Encounter: Payer: Self-pay | Admitting: Family Medicine

## 2022-12-17 ENCOUNTER — Ambulatory Visit: Payer: No Typology Code available for payment source | Admitting: Family Medicine

## 2022-12-17 VITALS — BP 117/67 | HR 77 | Temp 97.7°F | Ht 62.0 in | Wt 133.2 lb

## 2022-12-17 DIAGNOSIS — E782 Mixed hyperlipidemia: Secondary | ICD-10-CM

## 2022-12-17 DIAGNOSIS — E118 Type 2 diabetes mellitus with unspecified complications: Secondary | ICD-10-CM

## 2022-12-17 DIAGNOSIS — I10 Essential (primary) hypertension: Secondary | ICD-10-CM

## 2022-12-17 LAB — CMP14+EGFR
ALT: 33 IU/L — ABNORMAL HIGH (ref 0–32)
AST: 36 IU/L (ref 0–40)
Albumin: 4 g/dL (ref 3.9–4.9)
Alkaline Phosphatase: 132 IU/L — ABNORMAL HIGH (ref 44–121)
BUN/Creatinine Ratio: 20 (ref 12–28)
BUN: 22 mg/dL (ref 8–27)
Bilirubin Total: 0.6 mg/dL (ref 0.0–1.2)
CO2: 28 mmol/L (ref 20–29)
Calcium: 9.8 mg/dL (ref 8.7–10.3)
Chloride: 101 mmol/L (ref 96–106)
Creatinine, Ser: 1.1 mg/dL — ABNORMAL HIGH (ref 0.57–1.00)
Globulin, Total: 2.4 g/dL (ref 1.5–4.5)
Glucose: 96 mg/dL (ref 70–99)
Potassium: 4.5 mmol/L (ref 3.5–5.2)
Sodium: 142 mmol/L (ref 134–144)
Total Protein: 6.4 g/dL (ref 6.0–8.5)
eGFR: 57 mL/min/{1.73_m2} — ABNORMAL LOW (ref >59)

## 2022-12-17 LAB — CBC WITH DIFFERENTIAL/PLATELET
Basophils Absolute: 0 10*3/uL (ref 0.0–0.2)
Basos: 1 %
EOS (ABSOLUTE): 0.1 10*3/uL (ref 0.0–0.4)
Eos: 2 %
Hematocrit: 33.4 % — ABNORMAL LOW (ref 34.0–46.6)
Hemoglobin: 10.8 g/dL — ABNORMAL LOW (ref 11.1–15.9)
Immature Grans (Abs): 0 10*3/uL (ref 0.0–0.1)
Immature Granulocytes: 0 %
Lymphocytes Absolute: 0.6 10*3/uL — ABNORMAL LOW (ref 0.7–3.1)
Lymphs: 20 %
MCH: 33.5 pg — ABNORMAL HIGH (ref 26.6–33.0)
MCHC: 32.3 g/dL (ref 31.5–35.7)
MCV: 104 fL — ABNORMAL HIGH (ref 79–97)
Monocytes Absolute: 0.3 10*3/uL (ref 0.1–0.9)
Monocytes: 8 %
Neutrophils Absolute: 2.2 10*3/uL (ref 1.4–7.0)
Neutrophils: 69 %
Platelets: 129 10*3/uL — ABNORMAL LOW (ref 150–450)
RBC: 3.22 x10E6/uL — ABNORMAL LOW (ref 3.77–5.28)
RDW: 13.1 % (ref 11.7–15.4)
WBC: 3.2 10*3/uL — ABNORMAL LOW (ref 3.4–10.8)

## 2022-12-17 LAB — LIPID PANEL
Chol/HDL Ratio: 3 ratio (ref 0.0–4.4)
Cholesterol, Total: 132 mg/dL (ref 100–199)
HDL: 44 mg/dL (ref >39)
LDL Chol Calc (NIH): 60 mg/dL (ref 0–99)
Triglycerides: 166 mg/dL — ABNORMAL HIGH (ref 0–149)
VLDL Cholesterol Cal: 28 mg/dL (ref 5–40)

## 2022-12-17 LAB — BAYER DCA HB A1C WAIVED: HB A1C (BAYER DCA - WAIVED): 4.2 % — ABNORMAL LOW (ref 4.8–5.6)

## 2022-12-17 MED ORDER — VALSARTAN 320 MG PO TABS: 320.0000 mg | ORAL_TABLET | Freq: Every day | ORAL | 3 refills | Status: DC

## 2022-12-17 NOTE — Progress Notes (Signed)
Subjective:  Patient ID: Marilyn Ford,  female    DOB: 10/21/1960  Age: 62 y.o.    CC: Medical Management of Chronic Issues   HPI Marilyn Ford presents for  follow-up of hypertension. Patient has no history of headache chest pain or shortness of breath or recent cough. Patient also denies symptoms of TIA such as numbness weakness lateralizing. Patient denies side effects from medication. States taking it regularly.  Patient also  in for follow-up of elevated cholesterol. Doing well without complaints on current medication. Denies side effects  including myalgia and arthralgia and nausea. Also in today for liver function testing. Currently no chest pain, shortness of breath or other cardiovascular related symptoms noted.  Follow-up of diabetes. Patient does check blood sugar at home. Readings run between 90 and 150 Patient denies symptoms such as excessive hunger or urinary frequency, excessive hunger, nausea No significant hypoglycemic spells noted. Medications reviewed. Pt reports taking them regularly. Pt. denies complication/adverse reaction today.  (Taking 3 BID) History Marilyn Ford has a past medical history of Allergy, Anxiety, Breast cancer (HCC), Diabetes mellitus without complication (HCC), GERD (gastroesophageal reflux disease), Heart murmur, History of kidney stones, Hypertension, Pneumonia, Stroke (HCC), and Vaginal Pap smear, abnormal.   She has a past surgical history that includes Cholecystectomy; Cesarean section; Colonoscopy with propofol (N/A, 07/19/2020); polypectomy (07/19/2020); Portacath placement (N/A, 03/20/2022); Breast biopsy (10/31/2022); Simple mastectomy with axillary sentinel node biopsy (Right, 11/01/2022); and Radioactive seed guided axillary sentinel lymph node (Right, 11/01/2022).   Her family history includes Breast cancer in her maternal aunt; Cancer in her maternal grandmother; Diabetes in her brother and mother; Uterine cancer (age of onset: 47 - 110) in  her mother.She reports that she quit smoking about 7 years ago. Her smoking use included cigarettes. She started smoking about 23 years ago. She has a 12.5 pack-year smoking history. She has never used smokeless tobacco. She reports current alcohol use of about 2.0 - 3.0 standard drinks of alcohol per week. She reports that she does not use drugs.  Current Outpatient Medications on File Prior to Visit  Medication Sig Dispense Refill   Alcohol Swabs (B-D SINGLE USE SWABS REGULAR) PADS Test BS daily and as needed Dx E11.9 100 each 3   aluminum-magnesium hydroxide-simethicone (MAALOX) 200-200-20 MG/5ML SUSP Take 30 mLs by mouth 4 (four) times daily -  before meals and at bedtime. 480 mL 2   amLODipine (NORVASC) 10 MG tablet 1 tablet daily 90 tablet 3   Apple Cider Vinegar 500 MG TABS Take 500 mg by mouth in the morning.     aspirin 81 MG EC tablet Take 1 tablet (81 mg total) by mouth daily. 30 tablet 0   atorvastatin (LIPITOR) 80 MG tablet TAKE 1 TABLET EVERY DAY AT 6PM 90 tablet 3   Blood Glucose Calibration (TRUE METRIX LEVEL 1) Low SOLN Use with glucometer Dx E11.9 3 each 0   Blood Glucose Monitoring Suppl (TRUE METRIX AIR GLUCOSE METER) w/Device KIT Test BS daily and as needed Dx E11.9 1 kit 0   Calcium Carb-Cholecalciferol (CALCIUM-VITAMIN D) 600-400 MG-UNIT TABS Take 1 tablet by mouth in the morning.     capecitabine (XELODA) 500 MG tablet Take 3 tablets (1,500 mg total) by mouth 2 (two) times daily after a meal. Take for 14 days, then hold for 7 days. Repeat every 21 days. 84 tablet 7   Coenzyme Q10 (COQ10) 100 MG CAPS Take 100 mg by mouth in the morning.     Cranberry  425 MG CAPS Take 425 mg by mouth in the morning and at bedtime.     diphenoxylate-atropine (LOMOTIL) 2.5-0.025 MG tablet Take 1 tablet by mouth 4 (four) times daily as needed for diarrhea or loose stools. 30 tablet 0   Flaxseed, Linseed, (FLAXSEED OIL) 1000 MG CAPS Take 1,000 mg by mouth in the morning.     glucose blood (TRUE  METRIX BLOOD GLUCOSE TEST) test strip Test BS daily and as needed Dx E11.9 100 each 3   icosapent Ethyl (VASCEPA) 1 g capsule Take 2 g by mouth 2 (two) times daily.     Krill Oil 500 MG CAPS Take 3 capsules (1,500 mg total) by mouth in the morning and at bedtime.     lidocaine (XYLOCAINE) 2 % solution Use as directed 15 mLs in the mouth or throat every 6 (six) hours as needed for mouth pain. 250 mL 1   lidocaine (XYLOCAINE) 5 % ointment Apply 1 Application topically as needed. 35.44 g 0   LORazepam (ATIVAN) 1 MG tablet Take 1 tablet (1 mg total) by mouth 2 (two) times daily as needed for anxiety. 10 tablet 0   magnesium oxide (MAG-OX) 400 (240 Mg) MG tablet TAKE 1 TABLET BY MOUTH TWICE A DAY 60 tablet 1   meloxicam (MOBIC) 15 MG tablet TAKE 1 TABLET (15 MG TOTAL) BY MOUTH DAILY. FOR JOINT AND MUSCLE PAIN 90 tablet 1   metoprolol (TOPROL-XL) 200 MG 24 hr tablet TAKE 1 TABLET ONE TIME DAILY, WITH OR IMMEDIATELY FOLLOWING A MEAL 90 tablet 3   Multiple Vitamin (MULTIVITAMIN) capsule Take 1 capsule by mouth in the morning.     ondansetron (ZOFRAN) 8 MG tablet Take 1 tablet (8 mg total) by mouth every 8 (eight) hours as needed for nausea or vomiting. 30 tablet 3   pantoprazole (PROTONIX) 40 MG tablet TAKE 1 TABLET twice daily FOR STOMACH 180 tablet 3   traZODone (DESYREL) 150 MG tablet TAKE 1 OR 2 TABLETS AT BEDTIME FOR SLEEP 180 tablet 3   TRUEplus Lancets 33G MISC Test BS daily and as needed Dx E11.9 100 each 3   zinc gluconate 50 MG tablet Take 50 mg by mouth daily.     Zinc Oxide 20 % PSTE Apply 1 Application topically 3 (three) times daily. 57 g 1   No current facility-administered medications on file prior to visit.    ROS Review of Systems  Constitutional:  Positive for appetite change (poor) and unexpected weight change (chemo plus trulicity).  HENT: Negative.    Eyes:  Negative for visual disturbance.  Respiratory:  Negative for shortness of breath.   Cardiovascular:  Negative for chest  pain.  Gastrointestinal:  Negative for abdominal pain.  Musculoskeletal:  Negative for arthralgias.    Objective:  BP 117/67   Pulse 77   Temp 97.7 F (36.5 C)   Ht 5\' 2"  (1.575 m)   Wt 133 lb 3.2 oz (60.4 kg)   SpO2 99%   BMI 24.36 kg/m   BP Readings from Last 3 Encounters:  12/17/22 117/67  12/12/22 134/81  12/04/22 (!) 143/84    Wt Readings from Last 3 Encounters:  12/17/22 133 lb 3.2 oz (60.4 kg)  12/13/22 137 lb (62.1 kg)  12/12/22 137 lb 3.2 oz (62.2 kg)     Physical Exam Constitutional:      General: She is not in acute distress.    Appearance: She is well-developed.  Cardiovascular:     Rate and Rhythm: Normal rate  and regular rhythm.  Pulmonary:     Breath sounds: Normal breath sounds.  Musculoskeletal:        General: Normal range of motion.  Skin:    General: Skin is warm and dry.  Neurological:     Mental Status: She is alert and oriented to person, place, and time.     Diabetic Foot Exam - Simple   No data filed     Lab Results  Component Value Date   HGBA1C 4.7 (L) 09/12/2022   HGBA1C 6.3 (H) 06/11/2022   HGBA1C 8.1 (H) 03/07/2022    Assessment & Plan:   Marilyn Ford was seen today for medical management of chronic issues.  Diagnoses and all orders for this visit:  Controlled type 2 diabetes mellitus with complication, without long-term current use of insulin (HCC) -     Bayer DCA Hb A1c Waived  Primary hypertension -     CBC with Differential/Platelet -     CMP14+EGFR  Mixed hyperlipidemia -     Lipid panel  Other orders -     valsartan (DIOVAN) 320 MG tablet; Take 1 tablet (320 mg total) by mouth daily. For blood pressure.   I have discontinued Tequila Escovar. Harten's Trulicity. I am also having her maintain her multivitamin, aspirin EC, Flaxseed Oil, Apple Cider Vinegar, Calcium-Vitamin D, zinc gluconate, CoQ10, Cranberry, Krill Oil, True Metrix Air Glucose Meter, True Metrix Blood Glucose Test, TRUEplus Lancets 33G, True Metrix  Level 1, B-D SINGLE USE SWABS REGULAR, icosapent Ethyl, amLODipine, atorvastatin, metoprolol, traZODone, magnesium oxide, ondansetron, aluminum-magnesium hydroxide-simethicone, meloxicam, LORazepam, pantoprazole, lidocaine, diphenoxylate-atropine, lidocaine, Zinc Oxide, capecitabine, and valsartan.  Meds ordered this encounter  Medications   valsartan (DIOVAN) 320 MG tablet    Sig: Take 1 tablet (320 mg total) by mouth daily. For blood pressure.    Dispense:  90 tablet    Refill:  3     Follow-up: Return in about 3 months (around 03/19/2023).  Mechele Claude, M.D.

## 2022-12-18 ENCOUNTER — Ambulatory Visit: Payer: No Typology Code available for payment source | Admitting: Physical Therapy

## 2022-12-18 ENCOUNTER — Encounter: Payer: Self-pay | Admitting: Physical Therapy

## 2022-12-18 DIAGNOSIS — M25611 Stiffness of right shoulder, not elsewhere classified: Secondary | ICD-10-CM | POA: Diagnosis not present

## 2022-12-18 DIAGNOSIS — Z483 Aftercare following surgery for neoplasm: Secondary | ICD-10-CM

## 2022-12-18 DIAGNOSIS — M25511 Pain in right shoulder: Secondary | ICD-10-CM

## 2022-12-18 DIAGNOSIS — R293 Abnormal posture: Secondary | ICD-10-CM

## 2022-12-18 DIAGNOSIS — C50811 Malignant neoplasm of overlapping sites of right female breast: Secondary | ICD-10-CM

## 2022-12-18 NOTE — Therapy (Signed)
OUTPATIENT PHYSICAL THERAPY BREAST CANCER POST OP FOLLOW UP   Patient Name: Marilyn Ford MRN: [id number] DOB:1960-08-15, 62 y.o., female Today's Date: 12/18/2022  END OF SESSION:  PT End of Session - 12/18/22 1038     Visit Number 4    Number of Visits 10    Date for PT Re-Evaluation 12/19/22    PT Start Time 1002    PT Stop Time 1030    PT Time Calculation (min) 28 min    Activity Tolerance Patient tolerated treatment well    Behavior During Therapy WFL for tasks assessed/performed              Past Medical History:  Diagnosis Date   Allergy    Anxiety    Breast cancer (HCC)    Diabetes mellitus without complication (HCC)    GERD (gastroesophageal reflux disease)    Heart murmur    as a child   History of kidney stones    Hypertension    Pneumonia    Stroke [facility])    Vaginal Pap smear, abnormal    Past Surgical History:  Procedure Laterality Date   BREAST BIOPSY  10/31/2022   [place] RT RADIOACTIVE SEED LOC 10/31/2022 [facility]   CESAREAN SECTION     CHOLECYSTECTOMY     COLONOSCOPY WITH PROPOFOL N/A 07/19/2020   Procedure: COLONOSCOPY WITH PROPOFOL;  Surgeon: Dolores Frame, MD;  Location: [facility];  Service: Gastroenterology;  Laterality: N/A;  AM   POLYPECTOMY  07/19/2020   Procedure: POLYPECTOMY;  Surgeon: Dolores Frame, MD;  Location: [facility];  Service: Gastroenterology;;   PORTACATH PLACEMENT N/A 03/20/2022   Procedure: INSERTION PORT-A-CATH;  Surgeon: Abigail Miyamoto, MD;  Location: [facility];  Service: General;  Laterality: N/A;   RADIOACTIVE SEED GUIDED AXILLARY SENTINEL LYMPH NODE Right 11/01/2022   Procedure: RADIOACTIVE SEED GUIDED RIGHT AXILLARY SENTINEL LYMPH NODE DISSECTION;  Surgeon: Abigail Miyamoto, MD;  Location: [facility];  Service: General;  Laterality: Right;   SIMPLE MASTECTOMY WITH AXILLARY SENTINEL NODE BIOPSY Right 11/01/2022   Procedure: RIGHT SIMPLE MASTECTOMY;  Surgeon: Abigail Miyamoto, MD;  Location: [facility];  Service: General;  Laterality: Right;   Patient Active Problem List   Diagnosis Date Noted   S/P mastectomy, right 11/01/2022   Genetic testing 06/05/2022   Neutropenia, drug-induced (HCC) 05/28/2022   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) 03/05/2022   Encounter for screening fecal occult blood testing 03/01/2022   Routine cervical smear 03/01/2022   Axillary adenopathy 02/20/2022   Mass of lower outer quadrant of right breast 01/30/2022   Cat bite of index finger 09/12/2021   Gastroesophageal reflux disease without esophagitis 10/20/2019   GAD (generalized anxiety disorder) 10/20/2019   Insomnia due to medical condition 10/20/2019   Hemiparesis affecting right side as late effect of cerebrovascular accident (CVA) (HCC) 04/22/2015   Diabetes mellitus type II, controlled (HCC) 12/23/2014   Hypertension 12/23/2014    PCP: Mechele Claude, MD   REFERRING PROVIDER: Abigail Miyamoto, MD  REFERRING DIAG:  C50.811,Z17.1 (ICD-10-CM) - Malignant neoplasm of overlapping sites of right breast in female, estrogen receptor negative (HCC)   THERAPY DIAG:  Stiffness of right shoulder, not elsewhere classified  Acute pain of right shoulder  Aftercare following surgery for neoplasm  Abnormal posture  Malignant neoplasm of overlapping sites of right breast in female, estrogen receptor negative (HCC)  Rationale for Evaluation and Treatment: Rehabilitation  ONSET DATE: 02/20/22  SUBJECTIVE:  SUBJECTIVE STATEMENT: I cleaned windows with my R arm and I did ok. I am supposed to start radiation on Friday.   PERTINENT HISTORY:  Patient was diagnosed on 02/20/22 with right grade 3. It measures 3.8 cm and is located in the inner lower  quadrant. 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  2 nodes taken at biopsy - 1 was negative the other was positive. Had blood transfusion on 10/05/22. Plan is to undergo a R simple mastectomy and SLNB on 11/01/22. Hx of DVT and stroke   PATIENT GOALS:  Reassess how my recovery is going related to arm function, pain, and swelling.  PAIN:  Are you having pain? No  PRECAUTIONS: Recent Surgery, right UE Lymphedema risk, Other: hx of stroke and DVT  RED FLAGS: None   ACTIVITY LEVEL / LEISURE: pt reports she has been going up and down steps, planting   OBJECTIVE:   PATIENT SURVEYS:  QUICK DASH:     OBSERVATIONS: Healing mastectomy scar no signs of edema  POSTURE:  Forward head and rounded shoulders posture   UPPER EXTREMITY AROM/PROM:   A/PROM RIGHT   eval   RIGHT 11/21/22 RIGHT 12/11/22  Shoulder extension 68 59 76  Shoulder flexion 156 101 140  Shoulder abduction 180 105 160  Shoulder internal rotation 67 75   Shoulder external rotation 84 77                           (Blank rows = not tested)   A/PROM LEFT   eval  Shoulder extension 71  Shoulder flexion 155  Shoulder abduction 170  Shoulder internal rotation 58  Shoulder external rotation 93                          (Blank rows = not tested)   CERVICAL AROM: All within normal limits:      Percent limited  Flexion WFL  Extension WFL  Right lateral flexion 25% limited  Left lateral flexion WFL  Right rotation WFL  Left rotation WFL      UPPER EXTREMITY STRENGTH: 5/5   LYMPHEDEMA ASSESSMENTS:    LANDMARK RIGHT   eval RIGHT 11/21/22  10 cm proximal to olecranon process 24.2 26.1  Olecranon process 21.9 22.9  10 cm proximal to ulnar styloid process 19 19.7  Just proximal to ulnar styloid process 14.7 14.9  Across hand at thumb web space 17.7 18.5  At base of 2nd digit 6 6.1  (Blank rows = not tested)   LANDMARK LEFT   eval   10 cm proximal to olecranon process 25.2  Olecranon process 22  10 cm proximal to ulnar styloid process 18.7  Just proximal to ulnar styloid process 14.5  Across hand at thumb web space 18.5  At base of 2nd digit 5.9  (Blank rows = not tested)  Surgery type/Date: 11/01/22: R mastectomy and ALND Number of lymph nodes removed: 1/6 Current/past treatment (chemo, radiation, hormone therapy): completed neoadjuvant chemo, waiting to find out about radiation  Other symptoms:  Heaviness/tightness Yes Pain No Pitting edema No Infections No Decreased scar mobility Yes still healing Stemmer sign No  TREATMENT PERFORMED: 12/18/22 Pulleys x 2 min in direction of flexion and 2 min in direction of abduction with v/c to keep shoulders relaxed to avoid scapular compensation Supine on large mat table - AAROM with cane  for end range stretch as follows: flexion, abduction on R, ER bilaterally, horizontal abduction bilaterally with 5 sec holds x 10 each Reassessed goals - added previous exercises to HEP  12/11/22 Pulleys x 2 min in direction of flexion and 2 min in direction of abduction with v/c to keep shoulders relaxed to avoid scapular compensation Ball x 5 reps in to flexion - stopped due to fatigue, unable to do abduction Attempted PROM to R shoulder but pt very guarded and unable to relax Attempted sidelying R scapular mobs but pt unable to relax  MLD to R trunk as follows (therapist donned gloves per pt request due to therapist hands were cold): R inguinal nodes and establishment of axillo inguinal pathway spending extra time at area just inferior to axilla 11/21/22: PROM to R Shoulder into flexion and abduction with numerous v/c for pt relax - pt very guarded so stopped and did pulleys x 2 min in direction of flexion and 2 min in abduction with pt demonstrating ability to relax with this  PATIENT EDUCATION:  Education details: ABC class, importance of doing post op stretches, posture and need for  scapular retraction Person educated: Patient and Spouse Education method: Explanation, Demonstration, and Tactile cues Education comprehension: verbalized understanding and returned demonstration  HOME EXERCISE PROGRAM: Reviewed previously given post op HEP.   ASSESSMENT:  CLINICAL IMPRESSION: Remeasured pt's ROM and pt has now met all goals for therapy. She will be placed on hold to reassess after she completes radiation. Only focused on AAROM today since pt is unable to relax for PROM and pt did very well with exercises today. Added these to her HEP.   Pt will benefit from skilled therapeutic intervention to improve on the following deficits: Decreased knowledge of precautions, impaired UE functional use, pain, decreased ROM, postural dysfunction.   PT treatment/interventions: ADL/Self care home management, Therapeutic exercises, Therapeutic activity, Patient/Family education, Self Care, Joint mobilization, Orthotic/Fit training, Manual lymph drainage, scar mobilization, Taping, Manual therapy, and Re-evaluation   GOALS: Goals reviewed with patient? Yes  LONG TERM GOALS:  (STG=LTG)  GOALS Name Target Date  Goal status  1 Pt will demonstrate she has regained full shoulder ROM and function post operatively compared to baselines.  Baseline: 12/19/22 MET 12/18/22  2 Pt will demonstrate 170 degrees of R shoulder abduction to allow her to reach out to the side. 12/19/22 MET 12/18/22 174 degrees  3 Pt will demonstrate 150 degrees of R shoulder flexion to allow her to reach overhead. 12/19/22 MET 12/18/22 150 degrees  4 Pt will be independent in a home exercise program for continued stretching and strengthening. 12/19/22 ONGOING     PLAN:  PT FREQUENCY/DURATION: 2x/wk for 4 wks  PLAN FOR NEXT SESSION: on hold; will reassess after radiation   [facility]  [address]  [place] [address]  [phone]   Milagros Loll [place]agonia, PT 12/18/2022, 10:39 AM

## 2022-12-19 ENCOUNTER — Other Ambulatory Visit: Payer: Self-pay | Admitting: *Deleted

## 2022-12-19 MED ORDER — ALUMINUM-MAGNESIUM-SIMETHICONE 200-200-20 MG/5ML PO SUSP: 30.0000 mL | Freq: Three times a day (TID) | ORAL | 2 refills | Status: DC

## 2022-12-20 ENCOUNTER — Ambulatory Visit: Payer: No Typology Code available for payment source | Admitting: Physical Therapy

## 2022-12-20 ENCOUNTER — Other Ambulatory Visit (HOSPITAL_COMMUNITY): Payer: Self-pay

## 2022-12-21 ENCOUNTER — Other Ambulatory Visit (HOSPITAL_COMMUNITY): Payer: Self-pay

## 2022-12-21 ENCOUNTER — Telehealth: Payer: Self-pay

## 2022-12-21 ENCOUNTER — Other Ambulatory Visit: Payer: Self-pay

## 2022-12-21 MED ORDER — ALUMINUM-MAGNESIUM-SIMETHICONE 200-200-20 MG/5ML PO SUSP: 30.0000 mL | Freq: Three times a day (TID) | ORAL | 2 refills | Status: DC

## 2022-12-21 NOTE — Telephone Encounter (Signed)
Patient called needing refill on Maalox for mouth.  Sent to [facility] pharmacy to be filled.

## 2022-12-24 ENCOUNTER — Other Ambulatory Visit: Payer: Self-pay

## 2022-12-25 ENCOUNTER — Ambulatory Visit: Payer: No Typology Code available for payment source | Admitting: Physical Therapy

## 2022-12-27 ENCOUNTER — Encounter: Payer: No Typology Code available for payment source | Admitting: Physical Therapy

## 2022-12-28 ENCOUNTER — Other Ambulatory Visit: Payer: Self-pay

## 2022-12-28 ENCOUNTER — Other Ambulatory Visit (HOSPITAL_COMMUNITY): Payer: Self-pay

## 2022-12-28 DIAGNOSIS — K626 Ulcer of anus and rectum: Secondary | ICD-10-CM

## 2022-12-28 MED ORDER — BETAMETHASONE DIPROPIONATE 0.05 % EX CREA: TOPICAL_CREAM | Freq: Two times a day (BID) | CUTANEOUS | 0 refills | Status: DC

## 2022-12-28 MED ORDER — LIDOCAINE 5 % EX OINT
1.0000 | TOPICAL_OINTMENT | CUTANEOUS | 0 refills | Status: DC | PRN
Start: 2022-12-28 — End: 2023-01-30

## 2022-12-28 MED ORDER — LIDOCAINE VISCOUS HCL 2 % MT SOLN: 15.0000 mL | Freq: Four times a day (QID) | OROMUCOSAL | 1 refills | Status: DC | PRN

## 2022-12-28 NOTE — Telephone Encounter (Signed)
Patient called reporting blisters on her feet, body and in her mouth. Dr. Ellin Saba made aware. Order received for viscous lidocaine for mouth, topical lidocaine for feet and body and betamethasone for feet and body. Patient instructed to stop Xeloda until she sees Dr. Ellin Saba on Thursday, August 29th.

## 2023-01-01 ENCOUNTER — Encounter: Payer: No Typology Code available for payment source | Admitting: Physical Therapy

## 2023-01-02 NOTE — Progress Notes (Signed)
[facility] [address] [place] [address]    Clinic Day:  01/04/23   Referring physician: Mechele Claude, MD  Patient Care Team: Marilyn Claude, MD as PCP - General (Family Medicine) Marilyn Claude, MD (Family Medicine) [facility], [facility] as Pharmacist (Family Medicine) Marilyn [facility], Marilyn Boom, MD as Consulting Physician (Gastroenterology) Marilyn Macho, MD as Consulting Physician (General Surgery) Marilyn Proud, RN as Oncology Nurse Navigator Marilyn Angelica, RN as Oncology Nurse Navigator Marilyn Massed, MD as Medical Oncologist (Medical Oncology) Marilyn Sarah, RN as Oncology Nurse Navigator (Medical Oncology) Marilyn Massed, MD as Consulting Physician (Hematology) Marilyn Claude, MD as Referring Physician (Family Medicine)   ASSESSMENT & PLAN:   Assessment: 1.  Inflammatory right breast cancer: - Bilateral diagnostic mammogram (02/20/2022): Suspicious right axillary lymphadenopathy.  Indeterminate intramammary lymph node in the right breast at 9:00.  New right breast skin and trabecular thickening, related to vascular congestion from enlarged lymph nodes in the right axilla. - Right axillary lymph node core biopsy (02/20/2022): Morphology and IHC compatible with primary breast cancer with differential diagnosis of urothelial carcinoma and primary lung carcinoma (squamous).  Grade 3.  ER 0%, PR 0%, HER2 2+, Ki-67 60%, HER2 negative by FISH. - Right breast lymph node biopsy at 9:00 (02/20/2022): Negative for carcinoma. - MRI breast (03/02/2022): In the upper outer right breast, mid to posterior depth there is a patchy clumped non-mass enhancement in a linear orientation spanning approximately 3.8 cm.  There is diffuse thickening of the skin in the right breast with skin enhancement.  Left breast with no mass or abnormal enhancement.  Numerous bulky matted lymph nodes in the right axilla. - Right breast UOQ biopsy (03/19/2022): Benign  breast with fibrocystic changes including stromal fibrosis, adenosis, usual ductal hyperplasia.  Negative for carcinoma. - PET scan (03/22/2022): Bulky hypermetabolic right axillary and subpectoral lymphadenopathy.  No other definite new sites of metastatic disease.  6 mm right lower lobe lung nodule with no FDG uptake. - Neoadjuvant chemotherapy with weekly paclitaxel and pembrolizumab from 04/02/2022 through 06/21/2022,  AC and pembrolizumab from 07/12/2022 through 09/27/2022 - Right mastectomy and lymph node excision (11/01/2022): YpT0, YPN1A, 2/3 lymph nodes positive for metastatic disease, no ECE.  Surgical margins negative. - Germline mutation testing: Negative - Adjuvant pembrolizumab and Xeloda (CREATE-X) started on    Plan: 1.  Triple negative right breast cancer: - She reportedly started Xeloda 3 tablets twice daily on 12/05/2022. - She reported taking last Xeloda on 12/27/2022.  Her regimen is 2 weeks on/1 week off.  Ideally should have ended on 12/20/2022. - We have rechecked your prescription orders.  She was sent for 2-week supply. - It was not clear how she managed to take it over 3 weeks.  She reports taking 3 pills twice daily.  On 12/26/2022, she reported her feet hurting with some skin peeling of and soreness in the mouth.  This has improved. - Hands and feet are completely normal.  No mucositis. - She was told to use her cream to the palms and soles. - Reviewed labs which showed white count 3.1 with normal ANC.  Platelet count is 120.  LFTs are normal.  Creatinine 1.01 and stable. - She will start cycle 2 of Xeloda 3 tablets twice daily today. - I plan to see her back in 2 weeks with repeat labs.   2.  Hypomagnesemia: - Continue magnesium twice daily.  Magnesium is 1.7 today.   3.  Mild leukopenia and  thrombocytopenia: - Mild leukopenia and thrombocytopenia is stable.  ANC is normal.  Will closely monitor.    No orders of the defined types were placed in this  encounter.     Alben Deeds Teague,acting as a Neurosurgeon for Marilyn Massed, MD.,have documented all relevant documentation on the behalf of Marilyn Massed, MD,as directed by  Marilyn Massed, MD while in the presence of Marilyn Massed, MD.  I, Marilyn Massed MD, have reviewed the above documentation for accuracy and completeness, and I agree with the above.     Marilyn Massed, MD   8/30/202411:57 AM  CHIEF COMPLAINT:   Diagnosis: locally advanced TNBC    Cancer Staging  Malignant neoplasm of overlapping sites of right female breast [facility]) Staging form: Breast, AJCC 8th Edition - Clinical stage from 03/08/2022: Stage IIIC (cT4d, cN2, cM0, G3, ER-, PR-, HER2-) - Signed by Malachy Mood, MD on 03/08/2022    Prior Therapy: 1. carboplatin and paclitaxel, 4 cycles, 04/02/22 - 06/06/22 2. Pembrolizumab, 04/02/22 - 09/27/22 3. Adriamycin and Cytoxan, 4 cycles, 07/12/22 - 09/27/22  Current Therapy:  pending surgery   HISTORY OF PRESENT ILLNESS:   Oncology History Overview Note   Cancer Staging  Malignant neoplasm of overlapping sites of right female breast [facility]) Staging form: Breast, AJCC 8th Edition - Clinical stage from 03/08/2022: Stage IIIC (cT2, cN2, cM0, G3, ER-, PR-, HER2-) - Signed by Malachy Mood, MD on 03/08/2022    Malignant neoplasm of overlapping sites of right female breast [facility])  02/20/2022 Mammogram   CLINICAL DATA:  62 year old female recalled from screening mammography 03/01/2021 for right breast calcifications and subsequent benign discordant biopsy of these calcifications in the central posterior right breast December 2022 with excision recommended. The patient initially followed up with surgery in April however canceled her scheduled surgery and most recently followed up with Dr. Henreitta Leber September 2023 with diagnostic imaging, possible RF tag placement and subsequent excision recommended.   EXAM: DIGITAL DIAGNOSTIC BILATERAL MAMMOGRAM WITH  TOMOSYNTHESIS; ULTRASOUND RIGHT BREAST LIMITED  MPRESSION: 1.  Suspicious right axillary lymphadenopathy.   2. Indeterminate intramammary lymph node in the right breast at 9 o'clock.   3. 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   4. Decreased calcifications noted at prior benign biopsy site in the lower central posterior right breast at site of X shaped biopsy marking clip.   02/20/2022 Initial Biopsy   FINAL MICROSCOPIC DIAGNOSIS:   A. AXILLA, RIGHT, LYMPH NODE, NEEDLE CORE BIOPSY:  - Positive for carcinoma (see Comment)   B. LYMPH NODE, RIGHT BREAST, BIOPSY:  - Negative for carcinoma   COMMENT:  Part A: Morphology and immunohistochemical staining are most compatible with primary breast carcinoma with metaplastic changes, however differential diagnosis also includes urothelial carcinoma and less likely primary lung carcinoma (squamous).  No lymphoid tissue is identified.  Clinical and radiologic correlation is suggested.   ADDENDUM:  In case of a breast origin, the appropriate grade would be grade 3  (3+3+2)   ADDENDUM:  PROGNOSTIC INDICATOR RESULTS:  The tumor cells are EQUIVOCAL for Her2 (2+).  Estrogen Receptor:       0%, NEGATIVE  Progesterone Receptor:   0%, NEGATIVE  Proliferation Marker Ki-67:   60%   ADDENDUM:  FLOURESCENCE IN-SITU HYBRIDIZATION RESULTS:  GROUP 5:   HER2 **NEGATIVE**    03/02/2022 Imaging   EXAM: BILATERAL BREAST MRI WITH AND WITHOUT CONTRAST  IMPRESSION:  1. There is a suspicious 3.8 cm area of patchy non mass enhancement in a linear orientation in the slightly upper outer right breast in the mid to posterior depth spanning 3.8 cm.   2. Diffuse skin thickening with enhancement of the skin, concerning for inflammatory breast cancer.   3. Numerous bulky matted lymph nodes in the right axilla,  one of which corresponds with the biopsy-proven metastatic lymph node.   4.  No evidence of left breast malignancy.   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)   03/08/2022 Cancer Staging   Staging form: Breast, AJCC 8th Edition - Clinical stage from 03/08/2022: Stage IIIC (cT4d, cN2, cM0, G3, ER-, PR-, HER2-) - Signed by Malachy Mood, MD on 03/08/2022 Histologic grading system: 3 grade system   03/19/2022 Pathology Results   Diagnosis Breast, right, needle core biopsy, upper outer quadrant, barbell clip BENIGN BREAST WITH FIBROCYSTIC CHANGES INCLUDING STROMAL FIBROSIS, ADENOSIS AND USUAL DUCT HYPERPLASIA BENIGN FIBROMATOID CHANGE NEGATIVE FOR MICROCALCIFICATIONS NEGATIVE FOR CARCINOMA   03/22/2022 PET scan   IMPRESSION: Bulky hypermetabolic right axillary and subpectoral lymphadenopathy, consistent with metastatic disease.   No other definite sites of metastatic disease identified.   6 mm right lower lobe pulmonary nodule shows no FDG uptake, but is too small to definitively characterize by PET. Recommend continued follow-up by chest CT in 3-4 months.   Aortic Atherosclerosis (ICD10-I70.0).   04/02/2022 - 09/28/2022 Chemotherapy   Patient is on Treatment Plan : BREAST Pembrolizumab (200) D1 + Carboplatin (5) D1 + Paclitaxel (80) D1,8,15 q21d X 4 cycles / Pembrolizumab (200) D1 + AC D1 q21d x 4 cycles      Genetic Testing   Negative genetic testing. No pathogenic variants identified on the [facility] Common Hereditary Cancers+RNA panel. The report date is 05/31/2022.  The Common Hereditary Cancers Panel + RNA offered by [facility] includes sequencing and/or deletion duplication testing of the following 48 genes: APC*, ATM*, AXIN2, BAP1, BARD1, BMPR1A, BRCA1, BRCA2, BRIP1, CDH1, CDK4, CDKN2A (p14ARF), CDKN2A (p16INK4a), CHEK2, CTNNA1, DICER1*, EPCAM*, FH*, GREM1*, HOXB13, KIT, MBD4, MEN1*, MLH1*, MSH2*, MSH3*, MSH6*, MUTYH, NF1*, NTHL1,  PALB2, PDGFRA, PMS2*, POLD1*, POLE, PTEN*, RAD51C, RAD51D, SDHA*, SDHB, SDHC*, SDHD, SMAD4, SMARCA4, STK11, TP53, TSC1*, TSC2, VHL.    12/12/2022 -  Chemotherapy   Patient is on Treatment Plan : BREAST Pembrolizumab (200) q21d x 27 weeks        INTERVAL HISTORY:   Kianny is a 62 y.o. female presenting to clinic today for follow up of locally advanced TNBC. She was last seen by me on 12/04/22.  Today, she states that she is doing well overall. Her appetite level is at 75%. Her energy level is at 50%. She is accompanied by her husband.   She is not tolerating Xeloda well. She last took them on 12/27/22. She reports pain on the bottom of the feet and was unable to walk or put weight on the feet due to it the last day of taking them on 12/27/22. She also had moth soreness the last day of taking Xeloda as well. The pain and mouth soreness resolved after stopping treatment. Her feet were blistered when the pain occurred and have since resolved. There was numbness on one toe on the left foot.  She received her second bottle of Xeloda on 01/01/23. She also notes constant constipation while taking Xeloda that was manageable. She reports mild redness on the palms of the hands while taking Xeloda that have resolved  while on her break from treatment. She started Xeloda on 12/05/22 and took them until 12/27/22. She does not wish to receive 6 pills a day.   PAST MEDICAL HISTORY:   Past Medical History: Past Medical History:  Diagnosis Date   Allergy    Anxiety    Breast cancer (HCC)    Diabetes mellitus without complication (HCC)    GERD (gastroesophageal reflux disease)    Heart murmur    as a child   History of kidney stones    Hypertension    Pneumonia    Stroke [facility])    Vaginal Pap smear, abnormal     Surgical History: Past Surgical History:  Procedure Laterality Date   BREAST BIOPSY  10/31/2022   [place] RT RADIOACTIVE SEED LOC 10/31/2022 [facility] MAMMOGRAPHY   CESAREAN SECTION     CHOLECYSTECTOMY      COLONOSCOPY WITH PROPOFOL N/A 07/19/2020   Procedure: COLONOSCOPY WITH PROPOFOL;  Surgeon: Dolores Frame, MD;  Location: [facility];  Service: Gastroenterology;  Laterality: N/A;  AM   POLYPECTOMY  07/19/2020   Procedure: POLYPECTOMY;  Surgeon: Marilyn [facility], Marilyn Boom, MD;  Location: [facility];  Service: Gastroenterology;;   PORTACATH PLACEMENT N/A 03/20/2022   Procedure: INSERTION PORT-A-CATH;  Surgeon: Abigail Miyamoto, MD;  Location: [facility];  Service: General;  Laterality: N/A;   RADIOACTIVE SEED GUIDED AXILLARY SENTINEL LYMPH NODE Right 11/01/2022   Procedure: RADIOACTIVE SEED GUIDED RIGHT AXILLARY SENTINEL LYMPH NODE DISSECTION;  Surgeon: Abigail Miyamoto, MD;  Location: [facility];  Service: General;  Laterality: Right;   SIMPLE MASTECTOMY WITH AXILLARY SENTINEL NODE BIOPSY Right 11/01/2022   Procedure: RIGHT SIMPLE MASTECTOMY;  Surgeon: Abigail Miyamoto, MD;  Location: [facility];  Service: General;  Laterality: Right;    Social History: Social History   Socioeconomic History   Marital status: Married    Spouse name: Fayrene Fearing   Number of children: 2   Years of education: 10   Highest education level: 10th grade  Occupational History   Occupation: disabled  Tobacco Use   Smoking status: Former    Current packs/day: 0.00    Average packs/day: 0.5 packs/day for 25.0 years (12.5 ttl pk-yrs)    Types: Cigarettes    Start date: 12/08/1999    Quit date: 03/12/2015    Years since quitting: 7.8   Smokeless tobacco: Never  Vaping Use   Vaping status: Never Used  Substance and Sexual Activity   Alcohol use: Yes    Alcohol/week: 2.0 - 3.0 standard drinks of alcohol    Types: 2 - 3 Glasses of wine per week   Drug use: No   Sexual activity: Not Currently    Birth control/protection: Abstinence, Post-menopausal  Other Topics Concern   Not on file  Social History Narrative   Volunteers at [employer] for a few hours every day.     She really enjoys getting out of of the house and working there.    Social Determinants of Health   Financial Resource Strain: Low Risk  (09/08/2022)   Overall Financial Resource Strain (CARDIA)    Difficulty of Paying Living Expenses: Not very hard  Food Insecurity: No Food Insecurity (12/13/2022)   Hunger Vital Sign    Worried About Running Out of Food in the Last Year: Never true    Ran Out of Food in the Last Year: Never true  Transportation Needs: No Transportation Needs (12/13/2022)   PRAPARE - Transportation    Lack of  Transportation (Medical): No    Lack of Transportation (Non-Medical): No  Physical Activity: Inactive (09/08/2022)   Exercise Vital Sign    Days of Exercise per Week: 0 days    Minutes of Exercise per Session: 0 min  Stress: Stress Concern Present (09/08/2022)   [employer] of Occupational Health - Occupational Stress Questionnaire    Feeling of Stress : To some extent  Social Connections: Unknown (09/08/2022)   Social Connection and Isolation Panel [NHANES]    Frequency of Communication with Friends and Family: More than three times a week    Frequency of Social Gatherings with Friends and Family: Once a week    Attends Religious Services: Patient declined    Database administrator or Organizations: No    Attends Banker Meetings: Never    Marital Status: Married  Catering manager Violence: Not At Risk (12/13/2022)   Humiliation, Afraid, Rape, and Kick questionnaire    Fear of Current or Ex-Partner: No    Emotionally Abused: No    Physically Abused: No    Sexually Abused: No    Family History: Family History  Problem Relation Age of Onset   Diabetes Mother    Uterine cancer Mother 93 - 87   Diabetes Brother    Breast cancer Maternal Aunt        dx >50, d. from cancer   Cancer Maternal Grandmother        unk type, "back cancer?"    Current Medications:  Current Outpatient Medications:    Alcohol Swabs (B-D SINGLE USE SWABS REGULAR)  PADS, Test BS daily and as needed Dx E11.9, Disp: 100 each, Rfl: 3   aluminum-magnesium hydroxide-simethicone (MAALOX) 200-200-20 MG/5ML SUSP, Take 30 mLs by mouth 4 (four) times daily -  before meals and at bedtime., Disp: 480 mL, Rfl: 2   amLODipine (NORVASC) 10 MG tablet, 1 tablet daily, Disp: 90 tablet, Rfl: 3   Apple Cider Vinegar 500 MG TABS, Take 500 mg by mouth in the morning., Disp: , Rfl:    aspirin 81 MG EC tablet, Take 1 tablet (81 mg total) by mouth daily., Disp: 30 tablet, Rfl: 0   atorvastatin (LIPITOR) 80 MG tablet, TAKE 1 TABLET EVERY DAY AT 6PM, Disp: 90 tablet, Rfl: 3   betamethasone dipropionate 0.05 % cream, Apply topically 2 (two) times daily., Disp: 30 g, Rfl: 0   Blood Glucose Calibration (TRUE METRIX LEVEL 1) Low SOLN, Use with glucometer Dx E11.9, Disp: 3 each, Rfl: 0   Blood Glucose Monitoring Suppl (TRUE METRIX AIR GLUCOSE METER) w/Device KIT, Test BS daily and as needed Dx E11.9, Disp: 1 kit, Rfl: 0   Calcium Carb-Cholecalciferol (CALCIUM-VITAMIN D) 600-400 MG-UNIT TABS, Take 1 tablet by mouth in the morning., Disp: , Rfl:    capecitabine (XELODA) 500 MG tablet, Take 3 tablets (1,500 mg total) by mouth 2 (two) times daily after a meal. Take for 14 days, then hold for 7 days. Repeat every 21 days., Disp: 84 tablet, Rfl: 7   Coenzyme Q10 (COQ10) 100 MG CAPS, Take 100 mg by mouth in the morning., Disp: , Rfl:    Cranberry 425 MG CAPS, Take 425 mg by mouth in the morning and at bedtime., Disp: , Rfl:    diphenoxylate-atropine (LOMOTIL) 2.5-0.025 MG tablet, Take 1 tablet by mouth 4 (four) times daily as needed for diarrhea or loose stools., Disp: 30 tablet, Rfl: 0   Flaxseed, Linseed, (FLAXSEED OIL) 1000 MG CAPS, Take 1,000 mg by mouth in  the morning., Disp: , Rfl:    glucose blood (TRUE METRIX BLOOD GLUCOSE TEST) test strip, Test BS daily and as needed Dx E11.9, Disp: 100 each, Rfl: 3   icosapent Ethyl (VASCEPA) 1 g capsule, Take 2 g by mouth 2 (two) times daily., Disp: ,  Rfl:    Krill Oil 500 MG CAPS, Take 3 capsules (1,500 mg total) by mouth in the morning and at bedtime., Disp: , Rfl:    lidocaine (XYLOCAINE) 2 % solution, Use as directed 15 mLs in the mouth or throat every 6 (six) hours as needed for mouth pain., Disp: 250 mL, Rfl: 1   lidocaine (XYLOCAINE) 5 % ointment, Apply 1 Application topically as needed., Disp: 35.44 g, Rfl: 0   LORazepam (ATIVAN) 1 MG tablet, Take 1 tablet (1 mg total) by mouth 2 (two) times daily as needed for anxiety., Disp: 10 tablet, Rfl: 0   magnesium oxide (MAG-OX) 400 (240 Mg) MG tablet, TAKE 1 TABLET BY MOUTH TWICE A DAY, Disp: 60 tablet, Rfl: 1   meloxicam (MOBIC) 15 MG tablet, TAKE 1 TABLET (15 MG TOTAL) BY MOUTH DAILY. FOR JOINT AND MUSCLE PAIN, Disp: 90 tablet, Rfl: 1   metoprolol (TOPROL-XL) 200 MG 24 hr tablet, TAKE 1 TABLET ONE TIME DAILY, WITH OR IMMEDIATELY FOLLOWING A MEAL, Disp: 90 tablet, Rfl: 3   Multiple Vitamin (MULTIVITAMIN) capsule, Take 1 capsule by mouth in the morning., Disp: , Rfl:    ondansetron (ZOFRAN) 8 MG tablet, Take 1 tablet (8 mg total) by mouth every 8 (eight) hours as needed for nausea or vomiting., Disp: 30 tablet, Rfl: 3   pantoprazole (PROTONIX) 40 MG tablet, TAKE 1 TABLET twice daily FOR STOMACH, Disp: 180 tablet, Rfl: 3   traZODone (DESYREL) 150 MG tablet, TAKE 1 OR 2 TABLETS AT BEDTIME FOR SLEEP, Disp: 180 tablet, Rfl: 3   TRUEplus Lancets 33G MISC, Test BS daily and as needed Dx E11.9, Disp: 100 each, Rfl: 3   valsartan (DIOVAN) 320 MG tablet, Take 1 tablet (320 mg total) by mouth daily. For blood pressure., Disp: 90 tablet, Rfl: 3   zinc gluconate 50 MG tablet, Take 50 mg by mouth daily., Disp: , Rfl:    Zinc Oxide 20 % PSTE, Apply 1 Application topically 3 (three) times daily., Disp: 57 g, Rfl: 1   Allergies: Allergies  Allergen Reactions   Penicillins Shortness Of Breath   Sulfa Antibiotics Rash    REVIEW OF SYSTEMS:   Review of Systems  Constitutional:  Negative for chills,  fatigue and fever.  HENT:   Negative for lump/mass, mouth sores, nosebleeds, sore throat and trouble swallowing.   Eyes:  Negative for eye problems.  Respiratory:  Negative for cough and shortness of breath.   Cardiovascular:  Positive for palpitations. Negative for chest pain and leg swelling.  Gastrointestinal:  Positive for nausea. Negative for abdominal pain, constipation, diarrhea and vomiting.  Genitourinary:  Negative for bladder incontinence, difficulty urinating, dysuria, frequency, hematuria and nocturia.   Musculoskeletal:  Negative for arthralgias, back pain, flank pain, myalgias and neck pain.  Skin:  Negative for itching and rash.  Neurological:  Positive for headaches and numbness (brief). Negative for dizziness.  Hematological:  Does not bruise/bleed easily.  Psychiatric/Behavioral:  Negative for depression, sleep disturbance and suicidal ideas. The patient is not nervous/anxious.   All other systems reviewed and are negative.    VITALS:   There were no vitals taken for this visit.  Wt Readings from Last 3 Encounters:  01/03/23 132 lb 9.6 oz (60.1 kg)  01/03/23 133 lb 3.2 oz (60.4 kg)  12/17/22 133 lb 3.2 oz (60.4 kg)    There is no height or weight on file to calculate BMI.  Performance status (ECOG): 1 - Symptomatic but completely ambulatory  PHYSICAL EXAM:   Physical Exam Vitals and nursing note reviewed. Exam conducted with a chaperone present.  Constitutional:      Appearance: Normal appearance.  Cardiovascular:     Rate and Rhythm: Normal rate and regular rhythm.     Pulses: Normal pulses.     Heart sounds: Normal heart sounds.  Pulmonary:     Effort: Pulmonary effort is normal.     Breath sounds: Normal breath sounds.  Abdominal:     Palpations: Abdomen is soft. There is no hepatomegaly, splenomegaly or mass.     Tenderness: There is no abdominal tenderness.  Musculoskeletal:     Right lower leg: No edema.     Left lower leg: No edema.   Lymphadenopathy:     Cervical: No cervical adenopathy.     Right cervical: No superficial, deep or posterior cervical adenopathy.    Left cervical: No superficial, deep or posterior cervical adenopathy.     Upper Body:     Right upper body: No supraclavicular or axillary adenopathy.     Left upper body: No supraclavicular or axillary adenopathy.  Neurological:     General: No focal deficit present.     Mental Status: She is alert and oriented to person, place, and time.  Psychiatric:        Mood and Affect: Mood normal.        Behavior: Behavior normal.     LABS:      Latest Ref Rng & Units 01/03/2023    8:19 AM 12/17/2022    8:57 AM 12/12/2022    1:20 PM  CBC  WBC 4.0 - 10.5 K/uL 3.1  3.2  3.3   Hemoglobin 12.0 - 15.0 g/dL 8.9  78.2  9.7   Hematocrit 36.0 - 46.0 % 25.6  33.4  28.6   Platelets 150 - 400 K/uL 121  129  126       Latest Ref Rng & Units 01/03/2023    8:19 AM 12/17/2022    8:57 AM 12/12/2022    1:20 PM  CMP  Glucose 70 - 99 mg/dL 956  96  213   BUN 8 - 23 mg/dL 20  22  20    Creatinine 0.44 - 1.00 mg/dL 0.86  5.78  4.69   Sodium 135 - 145 mmol/L 137  142  136   Potassium 3.5 - 5.1 mmol/L 3.9  4.5  3.6   Chloride 98 - 111 mmol/L 101  101  101   CO2 22 - 32 mmol/L 26  28  25    Calcium 8.9 - 10.3 mg/dL 9.0  9.8  8.9   Total Protein 6.5 - 8.1 g/dL 6.2  6.4  6.4   Total Bilirubin 0.3 - 1.2 mg/dL 1.0  0.6  0.7   Alkaline Phos 38 - 126 U/L 85  132  93   AST 15 - 41 U/L 21  36  20   ALT 0 - 44 U/L 25  33  24      No results found for: "CEA1", "CEA" / No results found for: "CEA1", "CEA" No results found for: "PSA1" No results found for: "GEX528" No results found for: "CAN125"  No results found for: "TOTALPROTELP", "ALBUMINELP", "  A1GS", "A2GS", "BETS", "BETA2SER", "GAMS", "MSPIKE", "SPEI" No results found for: "TIBC", "FERRITIN", "IRONPCTSAT" No results found for: "LDH"   STUDIES:   No results found.

## 2023-01-03 ENCOUNTER — Encounter: Payer: No Typology Code available for payment source | Admitting: Physical Therapy

## 2023-01-03 ENCOUNTER — Inpatient Hospital Stay: Payer: No Typology Code available for payment source

## 2023-01-03 ENCOUNTER — Inpatient Hospital Stay: Payer: No Typology Code available for payment source | Admitting: Hematology

## 2023-01-03 VITALS — BP 125/66 | HR 61 | Temp 96.2°F | Resp 18 | Ht 62.0 in | Wt 132.6 lb

## 2023-01-03 DIAGNOSIS — Z171 Estrogen receptor negative status [ER-]: Secondary | ICD-10-CM

## 2023-01-03 DIAGNOSIS — Z5112 Encounter for antineoplastic immunotherapy: Secondary | ICD-10-CM | POA: Diagnosis not present

## 2023-01-03 DIAGNOSIS — C50811 Malignant neoplasm of overlapping sites of right female breast: Secondary | ICD-10-CM

## 2023-01-03 LAB — CBC WITH DIFFERENTIAL/PLATELET
Abs Immature Granulocytes: 0.01 10*3/uL (ref 0.00–0.07)
Basophils Absolute: 0 10*3/uL (ref 0.0–0.1)
Basophils Relative: 0 %
Eosinophils Absolute: 0.2 10*3/uL (ref 0.0–0.5)
Eosinophils Relative: 7 %
HCT: 25.6 % — ABNORMAL LOW (ref 36.0–46.0)
Hemoglobin: 8.9 g/dL — ABNORMAL LOW (ref 12.0–15.0)
Immature Granulocytes: 0 %
Lymphocytes Relative: 17 %
Lymphs Abs: 0.5 10*3/uL — ABNORMAL LOW (ref 0.7–4.0)
MCH: 36 pg — ABNORMAL HIGH (ref 26.0–34.0)
MCHC: 34.8 g/dL (ref 30.0–36.0)
MCV: 103.6 fL — ABNORMAL HIGH (ref 80.0–100.0)
Monocytes Absolute: 0.3 10*3/uL (ref 0.1–1.0)
Monocytes Relative: 10 %
Neutro Abs: 2 10*3/uL (ref 1.7–7.7)
Neutrophils Relative %: 66 %
Platelets: 121 10*3/uL — ABNORMAL LOW (ref 150–400)
RBC: 2.47 MIL/uL — ABNORMAL LOW (ref 3.87–5.11)
RDW: 17 % — ABNORMAL HIGH (ref 11.5–15.5)
WBC: 3.1 10*3/uL — ABNORMAL LOW (ref 4.0–10.5)
nRBC: 0 % (ref 0.0–0.2)

## 2023-01-03 LAB — COMPREHENSIVE METABOLIC PANEL
ALT: 25 U/L (ref 0–44)
AST: 21 U/L (ref 15–41)
Albumin: 3.7 g/dL (ref 3.5–5.0)
Alkaline Phosphatase: 85 U/L (ref 38–126)
Anion gap: 10 (ref 5–15)
BUN: 20 mg/dL (ref 8–23)
CO2: 26 mmol/L (ref 22–32)
Calcium: 9 mg/dL (ref 8.9–10.3)
Chloride: 101 mmol/L (ref 98–111)
Creatinine, Ser: 1.01 mg/dL — ABNORMAL HIGH (ref 0.44–1.00)
GFR, Estimated: >60 mL/min (ref >60)
Glucose, Bld: 154 mg/dL — ABNORMAL HIGH (ref 70–99)
Potassium: 3.9 mmol/L (ref 3.5–5.1)
Sodium: 137 mmol/L (ref 135–145)
Total Bilirubin: 1 mg/dL (ref 0.3–1.2)
Total Protein: 6.2 g/dL — ABNORMAL LOW (ref 6.5–8.1)

## 2023-01-03 LAB — SAMPLE TO BLOOD BANK

## 2023-01-03 LAB — MAGNESIUM: Magnesium: 1.7 mg/dL (ref 1.7–2.4)

## 2023-01-03 MED ORDER — SODIUM CHLORIDE 0.9 % IV SOLN: Freq: Once | INTRAVENOUS | Status: AC

## 2023-01-03 MED ORDER — SODIUM CHLORIDE 0.9% FLUSH
10.0000 mL | INTRAVENOUS | Status: DC | PRN
  Administered 2023-01-03: 10 mL

## 2023-01-03 MED ORDER — HEPARIN SOD (PORK) LOCK FLUSH 100 UNIT/ML IV SOLN
500.0000 [IU] | Freq: Once | INTRAVENOUS | Status: AC | PRN
  Administered 2023-01-03: 500 [IU]

## 2023-01-03 MED ORDER — SODIUM CHLORIDE 0.9 % IV SOLN
200.0000 mg | Freq: Once | INTRAVENOUS | Status: AC
  Administered 2023-01-03: 200 mg via INTRAVENOUS
  Filled 2023-01-03: qty 8

## 2023-01-03 MED ORDER — SODIUM CHLORIDE 0.9% FLUSH
10.0000 mL | INTRAVENOUS | Status: DC | PRN
  Administered 2023-01-03: 10 mL via INTRAVENOUS

## 2023-01-03 NOTE — Progress Notes (Signed)
Patient has been examined by Dr. Katragadda. Vital signs and labs have been reviewed by MD - ANC, Creatinine, LFTs, hemoglobin, and platelets are within treatment parameters per M.D. - pt may proceed with treatment.  Primary RN and pharmacy notified.  

## 2023-01-03 NOTE — Patient Instructions (Signed)
[facility] [place]  Discharge Instructions: Thank you for choosing [facility] to provide your oncology and hematology care.  If you have a lab appointment with the Cancer Center - please note that after April 8th, 2024, all labs will be drawn in the cancer center.  You do not have to check in or register with the main entrance as you have in the past but will complete your check-in in the cancer center.  Wear comfortable clothing and clothing appropriate for easy access to any Portacath or PICC line.   We strive to give you quality time with your provider. You may need to reschedule your appointment if you arrive late (15 or more minutes).  Arriving late affects you and other patients whose appointments are after yours.  Also, if you miss three or more appointments without notifying the office, you may be dismissed from the clinic at the provider's discretion.      For prescription refill requests, have your pharmacy contact our office and allow 72 hours for refills to be completed.    Today you received the following chemotherapy and/or immunotherapy agents Keytruda      To help prevent nausea and vomiting after your treatment, we encourage you to take your nausea medication as directed.  BELOW ARE SYMPTOMS THAT SHOULD BE REPORTED IMMEDIATELY: *FEVER GREATER THAN 100.4 F (38 C) OR HIGHER *CHILLS OR SWEATING *NAUSEA AND VOMITING THAT IS NOT CONTROLLED WITH YOUR NAUSEA MEDICATION *UNUSUAL SHORTNESS OF BREATH *UNUSUAL BRUISING OR BLEEDING *URINARY PROBLEMS (pain or burning when urinating, or frequent urination) *BOWEL PROBLEMS (unusual diarrhea, constipation, pain near the anus) TENDERNESS IN MOUTH AND THROAT WITH OR WITHOUT PRESENCE OF ULCERS (sore throat, sores in mouth, or a toothache) UNUSUAL RASH, SWELLING OR PAIN  UNUSUAL VAGINAL DISCHARGE OR ITCHING   Items with * indicate a potential emergency and should be followed up as soon as possible or go to the  Emergency Department if any problems should occur.  Please show the CHEMOTHERAPY ALERT CARD or IMMUNOTHERAPY ALERT CARD at check-in to the Emergency Department and triage nurse.  Should you have questions after your visit or need to cancel or reschedule your appointment, please contact [facility] [phone]  and follow the prompts.  Office hours are 8:00 a.m. to 4:30 p.m. Monday - Friday. Please note that voicemails left after 4:00 p.m. may not be returned until the following business day.  We are closed weekends and major holidays. You have access to a nurse at all times for urgent questions. Please call the main number to the clinic [phone] and follow the prompts.  For any non-urgent questions, you may also contact your provider using MyChart. We now offer e-Visits for anyone 18 and older to request care online for non-urgent symptoms. For details visit [email or web address].   Also download the MyChart app! Go to the app store, search "MyChart", open the app, select [place], and log in with your MyChart username and password.   

## 2023-01-03 NOTE — Patient Instructions (Signed)
[facility] at [facility] Discharge Instructions   You were seen and examined today by Dr. Ellin Saba.  He reviewed the results of your lab work which are normal/stable.   We will proceed with your treatment today.   Continue Xeloda twice a day. Take these pills for two weeks in a row with one week off. Repeat this process with every bottle.   Return as scheduled.    Thank you for choosing [facility] at [facility] to provide your oncology and hematology care.  To afford each patient quality time with our provider, please arrive at least 15 minutes before your scheduled appointment time.   If you have a lab appointment with the Cancer Center please come in thru the Main Entrance and check in at the main information desk.  You need to re-schedule your appointment should you arrive 10 or more minutes late.  We strive to give you quality time with our providers, and arriving late affects you and other patients whose appointments are after yours.  Also, if you no show three or more times for appointments you may be dismissed from the clinic at the providers discretion.     Again, thank you for choosing [facility].  Our hope is that these requests will decrease the amount of time that you wait before being seen by our physicians.       _____________________________________________________________  Should you have questions after your visit to [facility], please contact our office at [phone] and follow the prompts.  Our office hours are 8:00 a.m. and 4:30 p.m. Monday - Friday.  Please note that voicemails left after 4:00 p.m. may not be returned until the following business day.  We are closed weekends and major holidays.  You do have access to a nurse 24-7, just call the main number to the clinic [phone] and do not press any options, hold on the line and a nurse will answer the phone.    For prescription refill  requests, have your pharmacy contact our office and allow 72 hours.    Due to Covid, you will need to wear a mask upon entering the hospital. If you do not have a mask, a mask will be given to you at the Main Entrance upon arrival. For doctor visits, patients may have 1 support person age 61 or older with them. For treatment visits, patients can not have anyone with them due to social distancing guidelines and our immunocompromised population.

## 2023-01-03 NOTE — Progress Notes (Signed)
Patient presents today for Keytruda infusion per providers order.  Vital signs and labs reviewed by MD.  Message received from Allison Anderson RN/Dr. Katragadda patient okay for treatment.  Treatment given today per MD orders.  Stable during infusion without adverse affects.  Vital signs stable.  No complaints at this time.  Discharge from clinic ambulatory in stable condition.  Alert and oriented X 3.  Follow up with  Cancer Center as scheduled.  

## 2023-01-04 ENCOUNTER — Encounter: Payer: Self-pay | Admitting: Hematology

## 2023-01-14 ENCOUNTER — Other Ambulatory Visit (HOSPITAL_COMMUNITY): Payer: Self-pay

## 2023-01-16 ENCOUNTER — Other Ambulatory Visit (HOSPITAL_COMMUNITY): Payer: Self-pay

## 2023-01-16 NOTE — Progress Notes (Signed)
[facility] [address], [place] [address]    Clinic Day:  01/17/23    Referring physician: Mechele Claude, MD  Patient Care Team: Mechele Claude, MD as PCP - General (Family Medicine) Mechele Claude, MD (Family Medicine) Danella Maiers, [facility] as Pharmacist (Family Medicine) Marguerita Merles, Reuel Boom, MD as Consulting Physician (Gastroenterology) Franky Macho, MD as Consulting Physician (General Surgery) Pershing Proud, RN as Oncology Nurse Navigator [name], RN as Oncology Nurse Navigator [name], MD as Medical Oncologist (Medical Oncology) Therese Sarah, RN as Oncology Nurse Navigator (Medical Oncology) [name], MD as Consulting Physician (Hematology) Mechele Claude, MD as Referring Physician (Family Medicine)   ASSESSMENT & PLAN:   Assessment: 1.  Inflammatory right breast cancer: - Bilateral diagnostic mammogram (02/20/2022): Suspicious right axillary lymphadenopathy.  Indeterminate intramammary lymph node in the right breast at 9:00.  New right breast skin and trabecular thickening, related to vascular congestion from enlarged lymph nodes in the right axilla. - Right axillary lymph node core biopsy (02/20/2022): Morphology and IHC compatible with primary breast cancer with differential diagnosis of urothelial carcinoma and primary lung carcinoma (squamous).  Grade 3.  ER 0%, PR 0%, HER2 2+, Ki-67 60%, HER2 negative by FISH. - Right breast lymph node biopsy at 9:00 (02/20/2022): Negative for carcinoma. - MRI breast (03/02/2022): In the upper outer right breast, mid to posterior depth there is a patchy clumped non-mass enhancement in a linear orientation spanning approximately 3.8 cm.  There is diffuse thickening of the skin in the right breast with skin enhancement.  Left breast with no mass or abnormal enhancement.  Numerous bulky matted lymph nodes in the right axilla. - Right breast UOQ biopsy (03/19/2022): Benign  breast with fibrocystic changes including stromal fibrosis, adenosis, usual ductal hyperplasia.  Negative for carcinoma. - PET scan (03/22/2022): Bulky hypermetabolic right axillary and subpectoral lymphadenopathy.  No other definite new sites of metastatic disease.  6 mm right lower lobe lung nodule with no FDG uptake. - Neoadjuvant chemotherapy with weekly paclitaxel and pembrolizumab from 04/02/2022 through 06/21/2022,  AC and pembrolizumab from 07/12/2022 through 09/27/2022 - Right mastectomy and lymph node excision (11/01/2022): YpT0, YPN1A, 2/3 lymph nodes positive for metastatic disease, no ECE.  Surgical margins negative. - Germline mutation testing: Negative - Adjuvant pembrolizumab and Xeloda (CREATE-X) started on 12/05/2022, Xeloda dose reduced to 2 tablets twice daily with cycle 2 due to mucositis and feet hurting with skin peeling.    Plan: 1.  Triple negative right breast cancer: - She has taken cycle 2 of Xeloda with 2 tablets twice daily.  She will finish her last day today on 01/17/2023. - She denies any major GI side effects.  No signs of hand-foot skin reaction. - Labs today: Normal LFTs and creatinine.  CBC with mild leukopenia and thrombocytopenia.  Hemoglobin is 8.6. - She will receive Keytruda on 01/24/2023. - RTC 4 weeks for follow-up.   2.  Hypomagnesemia: - Continue magnesium twice daily.  Magnesium is 1.7 today.   3.  Mild leukopenia and thrombocytopenia: - Mild leukopenia and thrombocytopenia is stable.  ANC is normal.  Will closely monitor.    Orders Placed This Encounter  Procedures   Iron and TIBC ([facility] ONLY)    Standing Status:   Future    Standing Expiration Date:   01/17/2024   Ferritin    Standing Status:   Future    Standing Expiration Date:   01/17/2024      Marilyn Ford  R Teague,acting as a Neurosurgeon for [name], MD.,have documented all relevant documentation on the behalf of [name], MD,as directed by  [name], MD while in the presence of [name], MD.  I, [name] MD, have reviewed the above documentation for accuracy and completeness, and I agree with the above.      [name], MD   9/12/20245:50 PM  CHIEF COMPLAINT:   Diagnosis: locally advanced TNBC    Cancer Staging  Malignant neoplasm of overlapping sites of right female breast [facility]) Staging form: Breast, AJCC 8th Edition - Clinical stage from 03/08/2022: Stage IIIC (cT4d, cN2, cM0, G3, ER-, PR-, HER2-) - Signed by Malachy Mood, MD on 03/08/2022    Prior Therapy: 1. carboplatin and paclitaxel, 4 cycles, 04/02/22 - 06/06/22 2. Pembrolizumab, 04/02/22 - 09/27/22 3. Adriamycin and Cytoxan, 4 cycles, 07/12/22 - 09/27/22  Current Therapy:  pending surgery   HISTORY OF PRESENT ILLNESS:   Oncology History Overview Note   Cancer Staging  Malignant neoplasm of overlapping sites of right female breast [facility]) Staging form: Breast, AJCC 8th Edition - Clinical stage from 03/08/2022: Stage IIIC (cT2, cN2, cM0, G3, ER-, PR-, HER2-) - Signed by Malachy Mood, MD on 03/08/2022    Malignant neoplasm of overlapping sites of right female breast [facility])  02/20/2022 Mammogram   CLINICAL DATA:  62 year old female recalled from screening mammography 03/01/2021 for right breast calcifications and subsequent benign discordant biopsy of these calcifications in the central posterior right breast December 2022 with excision recommended. The patient initially followed up with surgery in April however canceled her scheduled surgery and most recently followed up with Dr. Henreitta Leber September 2023 with diagnostic imaging, possible RF tag placement and subsequent excision recommended.   EXAM: DIGITAL DIAGNOSTIC BILATERAL MAMMOGRAM WITH TOMOSYNTHESIS; ULTRASOUND RIGHT BREAST LIMITED  MPRESSION: 1.  Suspicious right axillary lymphadenopathy.   2. Indeterminate intramammary lymph node in the right breast at 9 o'clock.   3. New  right breast skin and trabecular thickening, possibly related to vascular congestion from enlarged lymph nodes in the right axilla although most concerning for inflammatory breast cancer.   4. Decreased calcifications noted at prior benign biopsy site in the lower central posterior right breast at site of X shaped biopsy marking clip.   02/20/2022 Initial Biopsy   FINAL MICROSCOPIC DIAGNOSIS:   A. AXILLA, RIGHT, LYMPH NODE, NEEDLE CORE BIOPSY:  - Positive for carcinoma (see Comment)   B. LYMPH NODE, RIGHT BREAST, BIOPSY:  - Negative for carcinoma   COMMENT:  Part A: Morphology and immunohistochemical staining are most compatible with primary breast carcinoma with metaplastic changes, however differential diagnosis also includes urothelial carcinoma and less likely primary lung carcinoma (squamous).  No lymphoid tissue is identified.  Clinical and radiologic correlation is suggested.   ADDENDUM:  In case of a breast origin, the appropriate grade would be grade 3  (3+3+2)   ADDENDUM:  PROGNOSTIC INDICATOR RESULTS:  The tumor cells are EQUIVOCAL for Her2 (2+).  Estrogen Receptor:       0%, NEGATIVE  Progesterone Receptor:   0%, NEGATIVE  Proliferation Marker Ki-67:   60%   ADDENDUM:  FLOURESCENCE IN-SITU HYBRIDIZATION RESULTS:  GROUP 5:   HER2 **NEGATIVE**    03/02/2022 Imaging   EXAM: BILATERAL BREAST MRI WITH AND WITHOUT CONTRAST  IMPRESSION: 1. There is a suspicious 3.8 cm area of patchy non mass enhancement in a linear orientation in the slightly upper outer right breast in the mid to posterior depth spanning 3.8 cm.  2. Diffuse skin thickening with enhancement of the skin, concerning for inflammatory breast cancer.   3. Numerous bulky matted lymph nodes in the right axilla, one of which corresponds with the biopsy-proven metastatic lymph node.   4.  No evidence of left breast malignancy.   03/05/2022 Initial Diagnosis   Malignant neoplasm of overlapping sites of  right female breast (HCC)   03/08/2022 Cancer Staging   Staging form: Breast, AJCC 8th Edition - Clinical stage from 03/08/2022: Stage IIIC (cT4d, cN2, cM0, G3, ER-, PR-, HER2-) - Signed by Malachy Mood, MD on 03/08/2022 Histologic grading system: 3 grade system   03/19/2022 Pathology Results   Diagnosis Breast, right, needle core biopsy, upper outer quadrant, barbell clip BENIGN BREAST WITH FIBROCYSTIC CHANGES INCLUDING STROMAL FIBROSIS, ADENOSIS AND USUAL DUCT HYPERPLASIA BENIGN FIBROMATOID CHANGE NEGATIVE FOR MICROCALCIFICATIONS NEGATIVE FOR CARCINOMA   03/22/2022 PET scan   IMPRESSION: Bulky hypermetabolic right axillary and subpectoral lymphadenopathy, consistent with metastatic disease.   No other definite sites of metastatic disease identified.   6 mm right lower lobe pulmonary nodule shows no FDG uptake, but is too small to definitively characterize by PET. Recommend continued follow-up by chest CT in 3-4 months.   Aortic Atherosclerosis (ICD10-I70.0).   04/02/2022 - 09/28/2022 Chemotherapy   Patient is on Treatment Plan : BREAST Pembrolizumab (200) D1 + Carboplatin (5) D1 + Paclitaxel (80) D1,8,15 q21d X 4 cycles / Pembrolizumab (200) D1 + AC D1 q21d x 4 cycles      Genetic Testing   Negative genetic testing. No pathogenic variants identified on the [facility] Common Hereditary Cancers+RNA panel. The report date is 05/31/2022.  The Common Hereditary Cancers Panel + RNA offered by [facility] includes sequencing and/or deletion duplication testing of the following 48 genes: APC*, ATM*, AXIN2, BAP1, BARD1, BMPR1A, BRCA1, BRCA2, BRIP1, CDH1, CDK4, CDKN2A (p14ARF), CDKN2A (p16INK4a), CHEK2, CTNNA1, DICER1*, EPCAM*, FH*, GREM1*, HOXB13, KIT, MBD4, MEN1*, MLH1*, MSH2*, MSH3*, MSH6*, MUTYH, NF1*, NTHL1, PALB2, PDGFRA, PMS2*, POLD1*, POLE, PTEN*, RAD51C, RAD51D, SDHA*, SDHB, SDHC*, SDHD, SMAD4, SMARCA4, STK11, TP53, TSC1*, TSC2, VHL.    12/12/2022 -  Chemotherapy   Patient is on Treatment Plan :  BREAST Pembrolizumab (200) q21d x 27 weeks        INTERVAL HISTORY:   Jerrah is a 62 y.o. female presenting to clinic today for follow up of locally advanced TNBC. She was last seen by me on 01/03/23.  Today, she states that she is doing well overall. Her appetite level is at 65%. Her energy level is at 100%. She is accompanied by her husband.   She has gained 5 pounds since her last visit. She reports of a staggering gait due to numbness in the feet. She also notes sleepiness. She is taking her last day of Xeloda today and has been taking 4 pills a day this cycle. Her next cycle starts on 01/24/23. She reports an improvement in pain and mouth soreness for this cycle compared to her previous.    PAST MEDICAL HISTORY:   Past Medical History: Past Medical History:  Diagnosis Date   Allergy    Anxiety    Breast cancer (HCC)    Diabetes mellitus without complication (HCC)    GERD (gastroesophageal reflux disease)    Heart murmur    as a child   History of kidney stones    Hypertension    Pneumonia    Stroke [facility])    Vaginal Pap smear, abnormal     Surgical History: Past Surgical History:  Procedure  Laterality Date   BREAST BIOPSY  10/31/2022   [place] RT RADIOACTIVE SEED LOC 10/31/2022 [facility] MAMMOGRAPHY   CESAREAN SECTION     CHOLECYSTECTOMY     COLONOSCOPY WITH PROPOFOL N/A 07/19/2020   Procedure: COLONOSCOPY WITH PROPOFOL;  Surgeon: Dolores Frame, MD;  Location: [facility];  Service: Gastroenterology;  Laterality: N/A;  AM   POLYPECTOMY  07/19/2020   Procedure: POLYPECTOMY;  Surgeon: Marguerita Merles, Reuel Boom, MD;  Location: [facility];  Service: Gastroenterology;;   PORTACATH PLACEMENT N/A 03/20/2022   Procedure: INSERTION PORT-A-CATH;  Surgeon: Abigail Miyamoto, MD;  Location: [facility];  Service: General;  Laterality: N/A;   RADIOACTIVE SEED GUIDED AXILLARY SENTINEL LYMPH NODE Right 11/01/2022   Procedure: RADIOACTIVE SEED GUIDED RIGHT AXILLARY SENTINEL LYMPH NODE  DISSECTION;  Surgeon: Abigail Miyamoto, MD;  Location: [facility];  Service: General;  Laterality: Right;   SIMPLE MASTECTOMY WITH AXILLARY SENTINEL NODE BIOPSY Right 11/01/2022   Procedure: RIGHT SIMPLE MASTECTOMY;  Surgeon: Abigail Miyamoto, MD;  Location: [facility];  Service: General;  Laterality: Right;    Social History: Social History   Socioeconomic History   Marital status: Married    Spouse name: Fayrene Fearing   Number of children: 2   Years of education: 10   Highest education level: 10th grade  Occupational History   Occupation: disabled  Tobacco Use   Smoking status: Former    Current packs/day: 0.00    Average packs/day: 0.5 packs/day for 25.0 years (12.5 ttl pk-yrs)    Types: Cigarettes    Start date: 12/08/1999    Quit date: 03/12/2015    Years since quitting: 7.8   Smokeless tobacco: Never  Vaping Use   Vaping status: Never Used  Substance and Sexual Activity   Alcohol use: Yes    Alcohol/week: 2.0 - 3.0 standard drinks of alcohol    Types: 2 - 3 Glasses of wine per week   Drug use: No   Sexual activity: Not Currently    Birth control/protection: Abstinence, Post-menopausal  Other Topics Concern   Not on file  Social History Narrative   Volunteers at [employer] for a few hours every day.    She really enjoys getting out of of the house and working there.    Social Determinants of Health   Financial Resource Strain: Low Risk  (09/08/2022)   Overall Financial Resource Strain (CARDIA)    Difficulty of Paying Living Expenses: Not very hard  Food Insecurity: No Food Insecurity (12/13/2022)   Hunger Vital Sign    Worried About Running Out of Food in the Last Year: Never true    Ran Out of Food in the Last Year: Never true  Transportation Needs: No Transportation Needs (12/13/2022)   PRAPARE - Administrator, Civil Service (Medical): No    Lack of Transportation (Non-Medical): No  Physical Activity: Inactive (09/08/2022)    Exercise Vital Sign    Days of Exercise per Week: 0 days    Minutes of Exercise per Session: 0 min  Stress: Stress Concern Present (09/08/2022)   [employer] of Occupational Health - Occupational Stress Questionnaire    Feeling of Stress : To some extent  Social Connections: Unknown (09/08/2022)   Social Connection and Isolation Panel [NHANES]    Frequency of Communication with Friends and Family: More than three times a week    Frequency of Social Gatherings with Friends and Family: Once a week    Attends Religious Services: Patient  declined    Active Member of Clubs or Organizations: No    Attends Banker Meetings: Never    Marital Status: Married  Catering manager Violence: Not At Risk (12/13/2022)   Humiliation, Afraid, Rape, and Kick questionnaire    Fear of Current or Ex-Partner: No    Emotionally Abused: No    Physically Abused: No    Sexually Abused: No    Family History: Family History  Problem Relation Age of Onset   Diabetes Mother    Uterine cancer Mother 37 - 22   Diabetes Brother    Breast cancer Maternal Aunt        dx >50, d. from cancer   Cancer Maternal Grandmother        unk type, "back cancer?"    Current Medications:  Current Outpatient Medications:    Alcohol Swabs (B-D SINGLE USE SWABS REGULAR) PADS, Test BS daily and as needed Dx E11.9, Disp: 100 each, Rfl: 3   aluminum-magnesium hydroxide-simethicone (MAALOX) 200-200-20 MG/5ML SUSP, Take 30 mLs by mouth 4 (four) times daily -  before meals and at bedtime., Disp: 480 mL, Rfl: 2   amLODipine (NORVASC) 10 MG tablet, 1 tablet daily, Disp: 90 tablet, Rfl: 3   Apple Cider Vinegar 500 MG TABS, Take 500 mg by mouth in the morning., Disp: , Rfl:    aspirin 81 MG EC tablet, Take 1 tablet (81 mg total) by mouth daily., Disp: 30 tablet, Rfl: 0   atorvastatin (LIPITOR) 80 MG tablet, TAKE 1 TABLET EVERY DAY AT 6PM, Disp: 90 tablet, Rfl: 3   betamethasone dipropionate 0.05 % cream, Apply topically  2 (two) times daily., Disp: 30 g, Rfl: 0   Blood Glucose Calibration (TRUE METRIX LEVEL 1) Low SOLN, Use with glucometer Dx E11.9, Disp: 3 each, Rfl: 0   Blood Glucose Monitoring Suppl (TRUE METRIX AIR GLUCOSE METER) w/Device KIT, Test BS daily and as needed Dx E11.9, Disp: 1 kit, Rfl: 0   Calcium Carb-Cholecalciferol (CALCIUM-VITAMIN D) 600-400 MG-UNIT TABS, Take 1 tablet by mouth in the morning., Disp: , Rfl:    Coenzyme Q10 (COQ10) 100 MG CAPS, Take 100 mg by mouth in the morning., Disp: , Rfl:    Cranberry 425 MG CAPS, Take 425 mg by mouth in the morning and at bedtime., Disp: , Rfl:    diphenoxylate-atropine (LOMOTIL) 2.5-0.025 MG tablet, Take 1 tablet by mouth 4 (four) times daily as needed for diarrhea or loose stools., Disp: 30 tablet, Rfl: 0   Flaxseed, Linseed, (FLAXSEED OIL) 1000 MG CAPS, Take 1,000 mg by mouth in the morning., Disp: , Rfl:    glucose blood (TRUE METRIX BLOOD GLUCOSE TEST) test strip, Test BS daily and as needed Dx E11.9, Disp: 100 each, Rfl: 3   icosapent Ethyl (VASCEPA) 1 g capsule, Take 2 g by mouth 2 (two) times daily., Disp: , Rfl:    Krill Oil 500 MG CAPS, Take 3 capsules (1,500 mg total) by mouth in the morning and at bedtime., Disp: , Rfl:    lidocaine (XYLOCAINE) 2 % solution, Use as directed 15 mLs in the mouth or throat every 6 (six) hours as needed for mouth pain., Disp: 250 mL, Rfl: 1   lidocaine (XYLOCAINE) 5 % ointment, Apply 1 Application topically as needed., Disp: 35.44 g, Rfl: 0   LORazepam (ATIVAN) 1 MG tablet, Take 1 tablet (1 mg total) by mouth 2 (two) times daily as needed for anxiety., Disp: 10 tablet, Rfl: 0   magnesium oxide (MAG-OX)  400 (240 Mg) MG tablet, TAKE 1 TABLET BY MOUTH TWICE A DAY, Disp: 60 tablet, Rfl: 1   meloxicam (MOBIC) 15 MG tablet, TAKE 1 TABLET (15 MG TOTAL) BY MOUTH DAILY. FOR JOINT AND MUSCLE PAIN, Disp: 90 tablet, Rfl: 1   metoprolol (TOPROL-XL) 200 MG 24 hr tablet, TAKE 1 TABLET ONE TIME DAILY, WITH OR IMMEDIATELY FOLLOWING  A MEAL, Disp: 90 tablet, Rfl: 3   Multiple Vitamin (MULTIVITAMIN) capsule, Take 1 capsule by mouth in the morning., Disp: , Rfl:    ondansetron (ZOFRAN) 8 MG tablet, Take 1 tablet (8 mg total) by mouth every 8 (eight) hours as needed for nausea or vomiting., Disp: 30 tablet, Rfl: 3   pantoprazole (PROTONIX) 40 MG tablet, TAKE 1 TABLET twice daily FOR STOMACH, Disp: 180 tablet, Rfl: 3   traZODone (DESYREL) 150 MG tablet, TAKE 1 OR 2 TABLETS AT BEDTIME FOR SLEEP, Disp: 180 tablet, Rfl: 3   TRUEplus Lancets 33G MISC, Test BS daily and as needed Dx E11.9, Disp: 100 each, Rfl: 3   valsartan (DIOVAN) 320 MG tablet, Take 1 tablet (320 mg total) by mouth daily. For blood pressure., Disp: 90 tablet, Rfl: 3   zinc gluconate 50 MG tablet, Take 50 mg by mouth daily., Disp: , Rfl:    Zinc Oxide 20 % PSTE, Apply 1 Application topically 3 (three) times daily., Disp: 57 g, Rfl: 1   capecitabine (XELODA) 500 MG tablet, Take 2 tablets (1,000 mg total) by mouth 2 (two) times daily after a meal. Take for 14 days, then hold for 7 days. Repeat every 21 days., Disp: 56 tablet, Rfl: 5   Allergies: Allergies  Allergen Reactions   Penicillins Shortness Of Breath   Sulfa Antibiotics Rash    REVIEW OF SYSTEMS:   Review of Systems  Constitutional:  Negative for chills, fatigue and fever.  HENT:   Negative for lump/mass, mouth sores, nosebleeds, sore throat and trouble swallowing.        +mouth soreness  Eyes:  Negative for eye problems.  Respiratory:  Negative for cough and shortness of breath.   Cardiovascular:  Negative for chest pain, leg swelling and palpitations.  Gastrointestinal:  Positive for constipation, diarrhea and nausea. Negative for abdominal pain and vomiting.  Genitourinary:  Negative for bladder incontinence, difficulty urinating, dysuria, frequency, hematuria and nocturia.   Musculoskeletal:  Negative for arthralgias, back pain, flank pain, myalgias and neck pain.  Skin:  Negative for itching and  rash.  Neurological:  Negative for dizziness, headaches and numbness.  Hematological:  Does not bruise/bleed easily.  Psychiatric/Behavioral:  Negative for depression, sleep disturbance and suicidal ideas. The patient is not nervous/anxious.   All other systems reviewed and are negative.    VITALS:   There were no vitals taken for this visit.  Wt Readings from Last 3 Encounters:  01/17/23 138 lb (62.6 kg)  01/03/23 132 lb 9.6 oz (60.1 kg)  01/03/23 133 lb 3.2 oz (60.4 kg)    There is no height or weight on file to calculate BMI.  Performance status (ECOG): 1 - Symptomatic but completely ambulatory  PHYSICAL EXAM:   Physical Exam Vitals and nursing note reviewed. Exam conducted with a chaperone present.  Constitutional:      Appearance: Normal appearance.  Cardiovascular:     Rate and Rhythm: Normal rate and regular rhythm.     Pulses: Normal pulses.     Heart sounds: Normal heart sounds.  Pulmonary:     Effort: Pulmonary effort is normal.  Breath sounds: Normal breath sounds.  Abdominal:     Palpations: Abdomen is soft. There is no hepatomegaly, splenomegaly or mass.     Tenderness: There is no abdominal tenderness.  Musculoskeletal:     Right lower leg: No edema.     Left lower leg: No edema.  Lymphadenopathy:     Cervical: No cervical adenopathy.     Right cervical: No superficial, deep or posterior cervical adenopathy.    Left cervical: No superficial, deep or posterior cervical adenopathy.     Upper Body:     Right upper body: No supraclavicular or axillary adenopathy.     Left upper body: No supraclavicular or axillary adenopathy.  Neurological:     General: No focal deficit present.     Mental Status: She is alert and oriented to person, place, and time.  Psychiatric:        Mood and Affect: Mood normal.        Behavior: Behavior normal.     LABS:      Latest Ref Rng & Units 01/17/2023    8:27 AM 01/03/2023    8:19 AM 12/17/2022    8:57 AM  CBC  WBC  4.0 - 10.5 K/uL 3.3  3.1  3.2   Hemoglobin 12.0 - 15.0 g/dL 8.6  8.9  59.5   Hematocrit 36.0 - 46.0 % 24.9  25.6  33.4   Platelets 150 - 400 K/uL 123  121  129       Latest Ref Rng & Units 01/17/2023    8:27 AM 01/03/2023    8:19 AM 12/17/2022    8:57 AM  CMP  Glucose 70 - 99 mg/dL 638  756  96   BUN 8 - 23 mg/dL 23  20  22    Creatinine 0.44 - 1.00 mg/dL 4.33  2.95  1.88   Sodium 135 - 145 mmol/L 135  137  142   Potassium 3.5 - 5.1 mmol/L 4.3  3.9  4.5   Chloride 98 - 111 mmol/L 100  101  101   CO2 22 - 32 mmol/L 25  26  28    Calcium 8.9 - 10.3 mg/dL 8.7  9.0  9.8   Total Protein 6.5 - 8.1 g/dL 6.2  6.2  6.4   Total Bilirubin 0.3 - 1.2 mg/dL 0.9  1.0  0.6   Alkaline Phos 38 - 126 U/L 86  85  132   AST 15 - 41 U/L 21  21  36   ALT 0 - 44 U/L 23  25  33      No results found for: "CEA1", "CEA" / No results found for: "CEA1", "CEA" No results found for: "PSA1" No results found for: "CZY606" No results found for: "CAN125"  No results found for: "TOTALPROTELP", "ALBUMINELP", "A1GS", "A2GS", "BETS", "BETA2SER", "GAMS", "MSPIKE", "SPEI" No results found for: "TIBC", "FERRITIN", "IRONPCTSAT" No results found for: "LDH"   STUDIES:   No results found.

## 2023-01-17 ENCOUNTER — Inpatient Hospital Stay: Payer: No Typology Code available for payment source | Attending: Hematology and Oncology

## 2023-01-17 ENCOUNTER — Inpatient Hospital Stay (HOSPITAL_BASED_OUTPATIENT_CLINIC_OR_DEPARTMENT_OTHER): Payer: No Typology Code available for payment source | Admitting: Hematology

## 2023-01-17 ENCOUNTER — Other Ambulatory Visit (HOSPITAL_COMMUNITY): Payer: Self-pay

## 2023-01-17 DIAGNOSIS — C50811 Malignant neoplasm of overlapping sites of right female breast: Secondary | ICD-10-CM | POA: Diagnosis not present

## 2023-01-17 DIAGNOSIS — Z171 Estrogen receptor negative status [ER-]: Secondary | ICD-10-CM | POA: Diagnosis not present

## 2023-01-17 DIAGNOSIS — D508 Other iron deficiency anemias: Secondary | ICD-10-CM

## 2023-01-17 DIAGNOSIS — Z5112 Encounter for antineoplastic immunotherapy: Secondary | ICD-10-CM | POA: Diagnosis not present

## 2023-01-17 DIAGNOSIS — Z79620 Long term (current) use of immunosuppressive biologic: Secondary | ICD-10-CM | POA: Insufficient documentation

## 2023-01-17 LAB — TSH: TSH: 3.29 u[IU]/mL (ref 0.350–4.500)

## 2023-01-17 LAB — CBC WITH DIFFERENTIAL/PLATELET
Abs Immature Granulocytes: 0 10*3/uL (ref 0.00–0.07)
Basophils Absolute: 0 10*3/uL (ref 0.0–0.1)
Basophils Relative: 1 %
Eosinophils Absolute: 0.2 10*3/uL (ref 0.0–0.5)
Eosinophils Relative: 6 %
HCT: 24.9 % — ABNORMAL LOW (ref 36.0–46.0)
Hemoglobin: 8.6 g/dL — ABNORMAL LOW (ref 12.0–15.0)
Immature Granulocytes: 0 %
Lymphocytes Relative: 14 %
Lymphs Abs: 0.5 10*3/uL — ABNORMAL LOW (ref 0.7–4.0)
MCH: 36.6 pg — ABNORMAL HIGH (ref 26.0–34.0)
MCHC: 34.5 g/dL (ref 30.0–36.0)
MCV: 106 fL — ABNORMAL HIGH (ref 80.0–100.0)
Monocytes Absolute: 0.3 10*3/uL (ref 0.1–1.0)
Monocytes Relative: 8 %
Neutro Abs: 2.4 10*3/uL (ref 1.7–7.7)
Neutrophils Relative %: 71 %
Platelets: 123 10*3/uL — ABNORMAL LOW (ref 150–400)
RBC: 2.35 MIL/uL — ABNORMAL LOW (ref 3.87–5.11)
RDW: 18.7 % — ABNORMAL HIGH (ref 11.5–15.5)
WBC: 3.3 10*3/uL — ABNORMAL LOW (ref 4.0–10.5)
nRBC: 0 % (ref 0.0–0.2)

## 2023-01-17 LAB — COMPREHENSIVE METABOLIC PANEL
ALT: 23 U/L (ref 0–44)
AST: 21 U/L (ref 15–41)
Albumin: 3.5 g/dL (ref 3.5–5.0)
Alkaline Phosphatase: 86 U/L (ref 38–126)
Anion gap: 10 (ref 5–15)
BUN: 23 mg/dL (ref 8–23)
CO2: 25 mmol/L (ref 22–32)
Calcium: 8.7 mg/dL — ABNORMAL LOW (ref 8.9–10.3)
Chloride: 100 mmol/L (ref 98–111)
Creatinine, Ser: 0.99 mg/dL (ref 0.44–1.00)
GFR, Estimated: >60 mL/min (ref >60)
Glucose, Bld: 160 mg/dL — ABNORMAL HIGH (ref 70–99)
Potassium: 4.3 mmol/L (ref 3.5–5.1)
Sodium: 135 mmol/L (ref 135–145)
Total Bilirubin: 0.9 mg/dL (ref 0.3–1.2)
Total Protein: 6.2 g/dL — ABNORMAL LOW (ref 6.5–8.1)

## 2023-01-17 LAB — SAMPLE TO BLOOD BANK

## 2023-01-17 LAB — MAGNESIUM: Magnesium: 1.8 mg/dL (ref 1.7–2.4)

## 2023-01-17 MED ORDER — HEPARIN SOD (PORK) LOCK FLUSH 100 UNIT/ML IV SOLN
500.0000 [IU] | Freq: Once | INTRAVENOUS | Status: AC
  Administered 2023-01-17: 500 [IU] via INTRAVENOUS

## 2023-01-17 MED ORDER — SODIUM CHLORIDE 0.9% FLUSH
10.0000 mL | Freq: Once | INTRAVENOUS | Status: AC
  Administered 2023-01-17: 10 mL via INTRAVENOUS

## 2023-01-17 MED ORDER — CAPECITABINE 500 MG PO TABS
1000.0000 mg | ORAL_TABLET | Freq: Two times a day (BID) | ORAL | 5 refills | Status: DC
  Filled 2023-01-17: qty 56, 14d supply, fill #0
  Filled 2023-02-11: qty 56, 21d supply, fill #1
  Filled 2023-02-26: qty 56, 21d supply, fill #2
  Filled 2023-03-13: qty 56, 21d supply, fill #3
  Filled 2023-04-10: qty 56, 21d supply, fill #4
  Filled 2023-04-30: qty 56, 21d supply, fill #5

## 2023-01-17 NOTE — Progress Notes (Signed)
Patient has been examined by Dr. Katragadda. Vital signs and labs have been reviewed by MD - ANC, Creatinine, LFTs, hemoglobin, and platelets are within treatment parameters per M.D. - pt may proceed with treatment.  Primary RN and pharmacy notified.  

## 2023-01-17 NOTE — Patient Instructions (Signed)
[facility] at [facility] Discharge Instructions   You were seen and examined today by Dr. Ellin Saba.  He reviewed the results of your lab work which are normal/stable.   We will proceed with your treatment next week.   Continue Xeloda as prescribed.   Return as scheduled.    Thank you for choosing [facility] at [facility] to provide your oncology and hematology care.  To afford each patient quality time with our provider, please arrive at least 15 minutes before your scheduled appointment time.   If you have a lab appointment with the Cancer Center please come in thru the Main Entrance and check in at the main information desk.  You need to re-schedule your appointment should you arrive 10 or more minutes late.  We strive to give you quality time with our providers, and arriving late affects you and other patients whose appointments are after yours.  Also, if you no show three or more times for appointments you may be dismissed from the clinic at the providers discretion.     Again, thank you for choosing [facility].  Our hope is that these requests will decrease the amount of time that you wait before being seen by our physicians.       _____________________________________________________________  Should you have questions after your visit to [facility], please contact our office at [phone] and follow the prompts.  Our office hours are 8:00 a.m. and 4:30 p.m. Monday - Friday.  Please note that voicemails left after 4:00 p.m. may not be returned until the following business day.  We are closed weekends and major holidays.  You do have access to a nurse 24-7, just call the main number to the clinic [phone] and do not press any options, hold on the line and a nurse will answer the phone.    For prescription refill requests, have your pharmacy contact our office and allow 72 hours.    Due to Covid, you  will need to wear a mask upon entering the hospital. If you do not have a mask, a mask will be given to you at the Main Entrance upon arrival. For doctor visits, patients may have 1 support person age 17 or older with them. For treatment visits, patients can not have anyone with them due to social distancing guidelines and our immunocompromised population.

## 2023-01-17 NOTE — Progress Notes (Signed)
nDonna T Grisby presented for Portacath access and flush. Proper placement of portacath confirmed by CXR. Portacath located left chest wall accessed with  H 20 needle. Good blood return present. Portacath flushed with 20ml NS and 500U/21ml Heparin and needle removed intact. Procedure without incident. Patient tolerated procedure well.

## 2023-01-18 LAB — T4: T4, Total: 10.2 ug/dL (ref 4.5–12.0)

## 2023-01-24 ENCOUNTER — Inpatient Hospital Stay: Payer: No Typology Code available for payment source

## 2023-01-24 ENCOUNTER — Other Ambulatory Visit: Payer: Self-pay

## 2023-01-24 VITALS — BP 128/64 | HR 66 | Temp 97.6°F | Resp 18

## 2023-01-24 VITALS — BP 131/63 | HR 66 | Temp 96.6°F | Resp 20 | Wt 137.4 lb

## 2023-01-24 DIAGNOSIS — C50811 Malignant neoplasm of overlapping sites of right female breast: Secondary | ICD-10-CM

## 2023-01-24 DIAGNOSIS — Z95828 Presence of other vascular implants and grafts: Secondary | ICD-10-CM

## 2023-01-24 DIAGNOSIS — R21 Rash and other nonspecific skin eruption: Secondary | ICD-10-CM

## 2023-01-24 DIAGNOSIS — Z5112 Encounter for antineoplastic immunotherapy: Secondary | ICD-10-CM | POA: Diagnosis not present

## 2023-01-24 LAB — SAMPLE TO BLOOD BANK

## 2023-01-24 LAB — COMPREHENSIVE METABOLIC PANEL
ALT: 22 U/L (ref 0–44)
AST: 19 U/L (ref 15–41)
Albumin: 3.7 g/dL (ref 3.5–5.0)
Alkaline Phosphatase: 89 U/L (ref 38–126)
Anion gap: 8 (ref 5–15)
BUN: 23 mg/dL (ref 8–23)
CO2: 27 mmol/L (ref 22–32)
Calcium: 8.9 mg/dL (ref 8.9–10.3)
Chloride: 103 mmol/L (ref 98–111)
Creatinine, Ser: 1.16 mg/dL — ABNORMAL HIGH (ref 0.44–1.00)
GFR, Estimated: 53 mL/min — ABNORMAL LOW (ref >60)
Glucose, Bld: 121 mg/dL — ABNORMAL HIGH (ref 70–99)
Potassium: 4.2 mmol/L (ref 3.5–5.1)
Sodium: 138 mmol/L (ref 135–145)
Total Bilirubin: 0.9 mg/dL (ref 0.3–1.2)
Total Protein: 6.4 g/dL — ABNORMAL LOW (ref 6.5–8.1)

## 2023-01-24 LAB — CBC WITH DIFFERENTIAL/PLATELET
Abs Immature Granulocytes: 0.01 10*3/uL (ref 0.00–0.07)
Basophils Absolute: 0 10*3/uL (ref 0.0–0.1)
Basophils Relative: 0 %
Eosinophils Absolute: 0.3 10*3/uL (ref 0.0–0.5)
Eosinophils Relative: 8 %
HCT: 26.9 % — ABNORMAL LOW (ref 36.0–46.0)
Hemoglobin: 9.1 g/dL — ABNORMAL LOW (ref 12.0–15.0)
Immature Granulocytes: 0 %
Lymphocytes Relative: 17 %
Lymphs Abs: 0.6 10*3/uL — ABNORMAL LOW (ref 0.7–4.0)
MCH: 36.5 pg — ABNORMAL HIGH (ref 26.0–34.0)
MCHC: 33.8 g/dL (ref 30.0–36.0)
MCV: 108 fL — ABNORMAL HIGH (ref 80.0–100.0)
Monocytes Absolute: 0.3 10*3/uL (ref 0.1–1.0)
Monocytes Relative: 9 %
Neutro Abs: 2.4 10*3/uL (ref 1.7–7.7)
Neutrophils Relative %: 66 %
Platelets: 115 10*3/uL — ABNORMAL LOW (ref 150–400)
RBC: 2.49 MIL/uL — ABNORMAL LOW (ref 3.87–5.11)
RDW: 20.8 % — ABNORMAL HIGH (ref 11.5–15.5)
WBC: 3.7 10*3/uL — ABNORMAL LOW (ref 4.0–10.5)
nRBC: 0 % (ref 0.0–0.2)

## 2023-01-24 LAB — MAGNESIUM: Magnesium: 1.8 mg/dL (ref 1.7–2.4)

## 2023-01-24 MED ORDER — SODIUM CHLORIDE 0.9% FLUSH
10.0000 mL | INTRAVENOUS | Status: DC | PRN
  Administered 2023-01-24: 10 mL

## 2023-01-24 MED ORDER — SODIUM CHLORIDE 0.9 % IV SOLN: Freq: Once | INTRAVENOUS | Status: AC

## 2023-01-24 MED ORDER — SODIUM CHLORIDE 0.9 % IV SOLN
200.0000 mg | Freq: Once | INTRAVENOUS | Status: AC
  Administered 2023-01-24: 200 mg via INTRAVENOUS
  Filled 2023-01-24: qty 8

## 2023-01-24 MED ORDER — HEPARIN SOD (PORK) LOCK FLUSH 100 UNIT/ML IV SOLN
500.0000 [IU] | Freq: Once | INTRAVENOUS | Status: AC | PRN
  Administered 2023-01-24: 500 [IU]

## 2023-01-24 MED ORDER — SODIUM CHLORIDE 0.9% FLUSH
10.0000 mL | INTRAVENOUS | Status: DC | PRN
  Administered 2023-01-24: 10 mL via INTRAVENOUS

## 2023-01-24 MED ORDER — BETAMETHASONE DIPROPIONATE 0.05 % EX CREA: TOPICAL_CREAM | Freq: Two times a day (BID) | CUTANEOUS | 0 refills | Status: DC

## 2023-01-24 MED ORDER — HEPARIN SOD (PORK) LOCK FLUSH 100 UNIT/ML IV SOLN: 500.0000 [IU] | Freq: Once | INTRAVENOUS | Status: DC

## 2023-01-24 NOTE — Patient Instructions (Signed)
[facility]  Discharge Instructions: Thank you for choosing [facility] to provide your oncology and hematology care.  If you have a lab appointment with the Cancer Center - please note that after April 8th, 2024, all labs will be drawn in the cancer center.  You do not have to check in or register with the main entrance as you have in the past but will complete your check-in in the cancer center.  Wear comfortable clothing and clothing appropriate for easy access to any Portacath or PICC line.   We strive to give you quality time with your provider. You may need to reschedule your appointment if you arrive late (15 or more minutes).  Arriving late affects you and other patients whose appointments are after yours.  Also, if you miss three or more appointments without notifying the office, you may be dismissed from the clinic at the provider's discretion.      For prescription refill requests, have your pharmacy contact our office and allow 72 hours for refills to be completed.    Today you received the following chemotherapy and/or immunotherapy agents Keytruda. Pembrolizumab Injection What is this medication? PEMBROLIZUMAB (PEM broe LIZ ue mab) treats some types of cancer. It works by helping your immune system slow or stop the spread of cancer cells. It is a monoclonal antibody. This medicine may be used for other purposes; ask your health care provider or pharmacist if you have questions. COMMON BRAND NAME(S): Keytruda What should I tell my care team before I take this medication? They need to know if you have any of these conditions: Allogeneic stem cell transplant (uses someone else's stem cells) Autoimmune diseases, such as Crohn disease, ulcerative colitis, lupus History of chest radiation Nervous system problems, such as Guillain-Barre syndrome, myasthenia gravis Organ transplant An unusual or allergic reaction to pembrolizumab, other medications,  foods, dyes, or preservatives Pregnant or trying to get pregnant Breast-feeding How should I use this medication? This medication is injected into a vein. It is given by your care team in a hospital or clinic setting. A special MedGuide will be given to you before each treatment. Be sure to read this information carefully each time. Talk to your care team about the use of this medication in children. While it may be prescribed for children as young as 6 months for selected conditions, precautions do apply. Overdosage: If you think you have taken too much of this medicine contact a poison control center or emergency room at once. NOTE: This medicine is only for you. Do not share this medicine with others. What if I miss a dose? Keep appointments for follow-up doses. It is important not to miss your dose. Call your care team if you are unable to keep an appointment. What may interact with this medication? Interactions have not been studied. This list may not describe all possible interactions. Give your health care provider a list of all the medicines, herbs, non-prescription drugs, or dietary supplements you use. Also tell them if you smoke, drink alcohol, or use illegal drugs. Some items may interact with your medicine. What should I watch for while using this medication? Your condition will be monitored carefully while you are receiving this medication. You may need blood work while taking this medication. This medication may cause serious skin reactions. They can happen weeks to months after starting the medication. Contact your care team right away if you notice fevers or flu-like symptoms with a rash. The rash  may be red or purple and then turn into blisters or peeling of the skin. You may also notice a red rash with swelling of the face, lips, or lymph nodes in your neck or under your arms. Tell your care team right away if you have any change in your eyesight. Talk to your care team if you  may be pregnant. Serious birth defects can occur if you take this medication during pregnancy and for 4 months after the last dose. You will need a negative pregnancy test before starting this medication. Contraception is recommended while taking this medication and for 4 months after the last dose. Your care team can help you find the option that works for you. Do not breastfeed while taking this medication and for 4 months after the last dose. What side effects may I notice from receiving this medication? Side effects that you should report to your care team as soon as possible: Allergic reactions--skin rash, itching, hives, swelling of the face, lips, tongue, or throat Dry cough, shortness of breath or trouble breathing Eye pain, redness, irritation, or discharge with blurry or decreased vision Heart muscle inflammation--unusual weakness or fatigue, shortness of breath, chest pain, fast or irregular heartbeat, dizziness, swelling of the ankles, feet, or hands Hormone gland problems--headache, sensitivity to light, unusual weakness or fatigue, dizziness, fast or irregular heartbeat, increased sensitivity to cold or heat, excessive sweating, constipation, hair loss, increased thirst or amount of urine, tremors or shaking, irritability Infusion reactions--chest pain, shortness of breath or trouble breathing, feeling faint or lightheaded Kidney injury (glomerulonephritis)--decrease in the amount of urine, red or dark Supriya Beaston urine, foamy or bubbly urine, swelling of the ankles, hands, or feet Liver injury--right upper belly pain, loss of appetite, nausea, light-colored stool, dark yellow or Forney Kleinpeter urine, yellowing skin or eyes, unusual weakness or fatigue Pain, tingling, or numbness in the hands or feet, muscle weakness, change in vision, confusion or trouble speaking, loss of balance or coordination, trouble walking, seizures Rash, fever, and swollen lymph nodes Redness, blistering, peeling, or loosening  of the skin, including inside the mouth Sudden or severe stomach pain, bloody diarrhea, fever, nausea, vomiting Side effects that usually do not require medical attention (report to your care team if they continue or are bothersome): Bone, joint, or muscle pain Diarrhea Fatigue Loss of appetite Nausea Skin rash This list may not describe all possible side effects. Call your doctor for medical advice about side effects. You may report side effects to FDA at 1-800-FDA-1088. Where should I keep my medication? This medication is given in a hospital or clinic. It will not be stored at home. NOTE: This sheet is a summary. It may not cover all possible information. If you have questions about this medicine, talk to your doctor, pharmacist, or health care provider.  2024 Elsevier/Gold Standard (2021-09-05 00:00:00)       To help prevent nausea and vomiting after your treatment, we encourage you to take your nausea medication as directed.  BELOW ARE SYMPTOMS THAT SHOULD BE REPORTED IMMEDIATELY: *FEVER GREATER THAN 100.4 F (38 C) OR HIGHER *CHILLS OR SWEATING *NAUSEA AND VOMITING THAT IS NOT CONTROLLED WITH YOUR NAUSEA MEDICATION *UNUSUAL SHORTNESS OF BREATH *UNUSUAL BRUISING OR BLEEDING *URINARY PROBLEMS (pain or burning when urinating, or frequent urination) *BOWEL PROBLEMS (unusual diarrhea, constipation, pain near the anus) TENDERNESS IN MOUTH AND THROAT WITH OR WITHOUT PRESENCE OF ULCERS (sore throat, sores in mouth, or a toothache) UNUSUAL RASH, SWELLING OR PAIN  UNUSUAL VAGINAL DISCHARGE OR ITCHING  Items with * indicate a potential emergency and should be followed up as soon as possible or go to the Emergency Department if any problems should occur.  Please show the CHEMOTHERAPY ALERT CARD or IMMUNOTHERAPY ALERT CARD at check-in to the Emergency Department and triage nurse.  Should you have questions after your visit or need to cancel or reschedule your appointment, please contact  [facility] AT [facility] [phone]  and follow the prompts.  Office hours are 8:00 a.m. to 4:30 p.m. Monday - Friday. Please note that voicemails left after 4:00 p.m. may not be returned until the following business day.  We are closed weekends and major holidays. You have access to a nurse at all times for urgent questions. Please call the main number to the clinic [phone] and follow the prompts.  For any non-urgent questions, you may also contact your provider using MyChart. We now offer e-Visits for anyone 37 and older to request care online for non-urgent symptoms. For details visit [email or web address].   Also download the MyChart app! Go to the app store, search "MyChart", open the app, select [facility], and log in with your MyChart username and password.

## 2023-01-24 NOTE — Progress Notes (Signed)
Patients port flushed without difficulty.  Good blood return noted with no bruising or swelling noted at site.  Patient remains accessed for treatment.  

## 2023-01-24 NOTE — Progress Notes (Signed)
Patient presents today for Keytruda infusion.  Patient is in satisfactory condition with no new complaints voiced.  Vital signs are stable.  Labs reviewed and all labs are within treatment parameters.  We will proceed with treatment per MD orders.    Patient tolerated treatment well with no complaints voiced.  Patient left ambulatory with husband in stable condition.  Vital signs stable at discharge.  Follow up as scheduled.

## 2023-01-28 ENCOUNTER — Ambulatory Visit: Payer: No Typology Code available for payment source | Attending: Surgery

## 2023-01-28 VITALS — Wt 137.2 lb

## 2023-01-28 DIAGNOSIS — Z483 Aftercare following surgery for neoplasm: Secondary | ICD-10-CM | POA: Insufficient documentation

## 2023-01-28 NOTE — Therapy (Signed)
OUTPATIENT PHYSICAL THERAPY SOZO SCREENING NOTE   Patient Name: Marilyn Ford MRN: [id number] DOB:1960-07-09, 62 y.o., female Today's Date: 01/28/2023  PCP: Mechele Claude, MD REFERRING PROVIDER: Abigail Miyamoto, MD   PT End of Session - 01/28/23 1037     Visit Number 4   # unchanged due to screen only   PT Start Time 1034    PT Stop Time 1039    PT Time Calculation (min) 5 min    Activity Tolerance Patient tolerated treatment well    Behavior During Therapy WFL for tasks assessed/performed             Past Medical History:  Diagnosis Date   Allergy    Anxiety    Breast cancer (HCC)    Diabetes mellitus without complication (HCC)    GERD (gastroesophageal reflux disease)    Heart murmur    as a child   History of kidney stones    Hypertension    Pneumonia    Stroke [facility])    Vaginal Pap smear, abnormal    Past Surgical History:  Procedure Laterality Date   BREAST BIOPSY  10/31/2022   [place] RT RADIOACTIVE SEED LOC 10/31/2022 [facility]MO[facility]   CESAREAN SECTION     CHOLECYSTECTOMY     COLONOSCOPY WITH PROPOFOL N/A 07/19/2020   Procedure: COLONOSCOPY WITH PROPOFOL;  Surgeon: Dolores Frame, MD;  Location: [facility];  Service: Gastroenterology;  Laterality: N/A;  AM   POLYPECTOMY  07/19/2020   Procedure: POLYPECTOMY;  Surgeon: Dolores Frame, MD;  Location: [facility];  Service: Gastroenterology;;   PORTACATH PLACEMENT N/A 03/20/2022   Procedure: INSERTION PORT-A-CATH;  Surgeon: Abigail Miyamoto, MD;  Location: [facility];  Service: General;  Laterality: N/A;   RADIOACTIVE SEED GUIDED AXILLARY SENTINEL LYMPH NODE Right 11/01/2022   Procedure: RADIOACTIVE SEED GUIDED RIGHT AXILLARY SENTINEL LYMPH NODE DISSECTION;  Surgeon: Abigail Miyamoto, MD;  Location: [facility];  Service: General;  Laterality: Right;   SIMPLE MASTECTOMY WITH AXILLARY SENTINEL NODE BIOPSY Right 11/01/2022   Procedure: RIGHT SIMPLE MASTECTOMY;  Surgeon:  Abigail Miyamoto, MD;  Location: [facility];  Service: General;  Laterality: Right;   Patient Active Problem List   Diagnosis Date Noted   S/P mastectomy, right 11/01/2022   Genetic testing 06/05/2022   Neutropenia, drug-induced (HCC) 05/28/2022   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) 03/05/2022   Encounter for screening fecal occult blood testing 03/01/2022   Routine cervical smear 03/01/2022   Axillary adenopathy 02/20/2022   Mass of lower outer quadrant of right breast 01/30/2022   Cat bite of index finger 09/12/2021   Gastroesophageal reflux disease without esophagitis 10/20/2019   GAD (generalized anxiety disorder) 10/20/2019   Insomnia due to medical condition 10/20/2019   Hemiparesis affecting right side as late effect of cerebrovascular accident (CVA) (HCC) 04/22/2015   Diabetes mellitus type II, controlled (HCC) 12/23/2014   Hypertension 12/23/2014    REFERRING DIAG: right breast cancer at risk for lymphedema  THERAPY DIAG: Aftercare following surgery for neoplasm  PERTINENT HISTORY: Patient was diagnosed on 02/20/22 with right grade 3. It measures 3.8 cm and is located in the inner lower quadrant. 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 2 nodes taken at biopsy - 1 was negative the other was positive. Had blood transfusion on 10/05/22. Plan is to undergo a R simple mastectomy and SLNB on 11/01/22. Hx of  DVT and stroke   PRECAUTIONS: right UE Lymphedema risk, None  SUBJECTIVE: Pt returns for her first 3 month L-Dex screen.   PAIN:  Are you having pain? No  SOZO SCREENING: Patient was assessed today using the SOZO machine to determine the lymphedema index score. This was compared to her baseline score. It was determined that she is within the recommended range when compared to her baseline and no further action is needed at this time. She will continue  SOZO screenings. These are done every 3 months for 2 years post operatively followed by every 6 months for 2 years, and then annually.   L-DEX FLOWSHEETS - 01/28/23 1000       L-DEX LYMPHEDEMA SCREENING   Measurement Type Unilateral    L-DEX MEASUREMENT EXTREMITY Upper Extremity    POSITION  Standing    DOMINANT SIDE Right    At Risk Side Right    BASELINE SCORE (UNILATERAL) -6.8    L-DEX SCORE (UNILATERAL) -5.8    VALUE CHANGE (UNILAT) 1               Hermenia Bers, PTA 01/28/2023, 10:38 AM

## 2023-01-29 ENCOUNTER — Telehealth: Payer: Self-pay | Admitting: *Deleted

## 2023-01-29 NOTE — Telephone Encounter (Signed)
Patient called with c/o rash to legs and is on Xeloda.  Appointment made for tomorrow to be assessed by Rojelio Brenner, PAC.

## 2023-01-29 NOTE — Progress Notes (Unsigned)
[facility] [address] [place] [address] Phone: [phone] Fax: [phone]  SYMPTOM MANAGEMENT CLINIC PROGRESS NOTE   Marilyn Ford [id number] 1960/09/23 62 y.o.   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  She completed neoadjuvant chemotherapy with weekly paclitaxel and pembrolizumab from 04/02/2022 through 06/21/2022 as well as AC and pembrolizumab from 07/12/2022 through 09/27/2022.  She had right mastectomy and lymph node excision on 11/01/2022.   She has been on Keytruda (neoadjuvant and adjuvant) since 04/02/2022, most recently given on 01/24/2023.  She has been on Xeloda since 12/05/2022.  Xeloda dose was reduced to 2 tablets twice daily with cycle #2 due to mucositis and feet hurting with skin peeling.  Patient called Cancer Center on 01/29/2023 to report rash of bilateral legs with small lesion on left arm.  Symptoms began on Saturday 01/26/23.  Rash is flat, pruritic, and has spread from bilateral lower extremities to her left upper extremity.  She has been using betamethasone cream, with some relief of symptoms.  She has not had any previous episodes.  She denies any new exposures such as changing detergents or soaps.  She does take her dogs outside into the grass, but wears long pants and has not noticed any obvious bug bites.  She has not had any recurrent skin peeling or soreness in her hands/feet since Xeloda dose was reduced.  She continues to have some neuropathy, and was recently prescribed gabapentin.  She also reports some increased "itching and burning" eye irritation and watery eyes for the past few weeks.  She has light sensitivity, but denies any vision changes.  She has had similar episodes in the past associated with ragweed allergies from August through October.  She also complains of bilateral tooth pain since starting Xeloda, reports that her  teeth hurt especially when she is trying to chew.  She has not had any recurrent mouth sores since her Xeloda dose was reduced.  She has an upcoming appointment with a dentist, and wants to make sure she is able to have oral x-rays and a dental cleaning completed.     ASSESSMENT & PLAN:  ## INFLAMMATORY RIGHT BREAST CANCER - Primary oncologist is Dr. Ellin Saba - Diagnosed in October 2023 - Completed neoadjuvant chemotherapy with weekly paclitaxel and pembrolizumab from 04/02/2022 through 06/21/2022 as well as AC and pembrolizumab from 07/12/2022 through 09/27/2022. - Right mastectomy and lymph node excision on 11/01/2022. - Started on adjuvant pembrolizumab and Xeloda on 12/05/2022.  Xeloda dose was reduced to 2 tablets twice daily with cycle #2 due to mucositis and feet hurting with skin peeling. - She was last seen by Dr. Ellin Saba on 01/17/2023 - PLAN: Follow-up with Dr. Ellin Saba as scheduled on 02/14/2023.  # RASH - immunotherapy-induced, grade 1 - Onset of rash as described in HPI, with physical exam as below - Per discussion with Dr. Anders Simmonds, appearance of rash is most consistent with immunotherapy induced rash from pembrolizumab - Rash is mild, covers <10% of body surface area = grade 1 - PLAN: Continue betamethasone cream twice daily for symptom relief. - Start taking cetirizine 10 mg nightly. - No indication to hold or reduce dose of pembrolizumab, as long as rash has resolved prior to next cycle of treatment. - If any worsening of symptoms or lack of improvement, patient to contact office so that we can prescribe higher strength steroid cream.  # EYE IRRITATION -"itching and  burning" eye irritation and watery eyes for the past few weeks. - Admits to light sensitivity, but denies any vision changes. - Very mild conjunctival injection on exam. - Similar episodes in the past associated with ragweed allergies from August through October. - PLAN: Likely secondary to seasonal allergies.   Recommend cetirizine 10 mg nightly as above.  Patient instructed to contact office for any worsening symptoms.  # TOOTH PAIN - Bilateral tooth pain since starting Xeloda, reports that her teeth hurt especially when she is trying to chew. - Denies any recurrent mouth sores since her Xeloda dose was reduced. - No mouth sores on exam. - She has an upcoming appointment with a dentist, and wants to make sure she is able to have oral x-rays and a dental cleaning completed.   - PLAN: Patient to follow-up with dentist.  No contraindication to dental or x-rays.  PLAN SUMMARY: >> Medications as above - patient instructed to call office if symptoms worsen or fail to improve within the next 72 hours >> Next scheduled appointment with Dr. Ellin Saba: 02/14/2023    REVIEW OF SYSTEMS:   Review of Systems  Constitutional:  Positive for fatigue. Negative for activity change, appetite change, chills, diaphoresis, fever and unexpected weight change.  HENT:  Positive for dental problem (tooth pain with chewing). Negative for mouth sores, nosebleeds, sore throat and trouble swallowing.   Eyes:  Positive for photophobia, redness and itching.  Respiratory:  Negative for cough and shortness of breath.   Cardiovascular:  Negative for chest pain, palpitations and leg swelling.  Gastrointestinal:  Negative for abdominal pain, blood in stool, constipation, diarrhea, nausea and vomiting.  Genitourinary:  Negative for dysuria and hematuria.  Neurological:  Positive for numbness. Negative for dizziness, light-headedness and headaches.  Psychiatric/Behavioral:  Negative for dysphoric mood and sleep disturbance. The patient is not nervous/anxious.     Past Medical History, Surgical history, Social history, and Family history were reviewed as documented elsewhere in chart, and were updated as appropriate.   OBJECTIVE:  Physical Exam:  BP (!) 128/31 (BP Location: Left Arm, Patient Position: Sitting)   Pulse 65   Temp  99.2 F (37.3 C) (Oral)   Resp 16   Wt 139 lb 3.2 oz (63.1 kg)   SpO2 98%   BMI 25.46 kg/m  ECOG: 1  Physical Exam Constitutional:      Appearance: Normal appearance. She is obese.  HENT:     Mouth/Throat:     Lips: No lesions.     Mouth: Mucous membranes are moist.  Eyes:     Conjunctiva/sclera:     Right eye: Right conjunctiva is injected (mild). No chemosis, exudate or hemorrhage.    Left eye: Left conjunctiva is injected (mild). No chemosis, exudate or hemorrhage. Cardiovascular:     Heart sounds: Normal heart sounds.  Pulmonary:     Breath sounds: Normal breath sounds.  Skin:    Findings: Rash (pictured below - flat macropapular rash of bilateral lower extremities (L > R) and left elbow) present.  Neurological:     General: No focal deficit present.     Mental Status: Mental status is at baseline.  Psychiatric:        Behavior: Behavior normal. Behavior is cooperative.       Lab Review:     Component Value Date/Time   NA 138 01/24/2023 1155   NA 142 12/17/2022 0857   K 4.2 01/24/2023 1155   CL 103 01/24/2023 1155   CO2 27 01/24/2023  1155   GLUCOSE 121 (H) 01/24/2023 1155   BUN 23 01/24/2023 1155   BUN 22 12/17/2022 0857   CREATININE 1.16 (H) 01/24/2023 1155   CREATININE 0.97 04/02/2022 0924   CALCIUM 8.9 01/24/2023 1155   PROT 6.4 (L) 01/24/2023 1155   PROT 6.4 12/17/2022 0857   ALBUMIN 3.7 01/24/2023 1155   ALBUMIN 4.0 12/17/2022 0857   AST 19 01/24/2023 1155   AST 23 04/02/2022 0924   ALT 22 01/24/2023 1155   ALT 26 04/02/2022 0924   ALKPHOS 89 01/24/2023 1155   BILITOT 0.9 01/24/2023 1155   BILITOT 0.6 12/17/2022 0857   BILITOT 0.6 04/02/2022 0924   GFRNONAA 53 (L) 01/24/2023 1155   GFRNONAA >60 04/02/2022 0924   GFRAA 66 05/02/2020 1024       Component Value Date/Time   WBC 3.7 (L) 01/24/2023 1155   RBC 2.49 (L) 01/24/2023 1155   HGB 9.1 (L) 01/24/2023 1155   HGB 10.8 (L) 12/17/2022 0857   HCT 26.9 (L) 01/24/2023 1155   HCT 33.4 (L)  12/17/2022 0857   PLT 115 (L) 01/24/2023 1155   PLT 129 (L) 12/17/2022 0857   MCV 108.0 (H) 01/24/2023 1155   MCV 104 (H) 12/17/2022 0857   MCH 36.5 (H) 01/24/2023 1155   MCHC 33.8 01/24/2023 1155   RDW 20.8 (H) 01/24/2023 1155   RDW 13.1 12/17/2022 0857   LYMPHSABS 0.6 (L) 01/24/2023 1155   LYMPHSABS 0.6 (L) 12/17/2022 0857   MONOABS 0.3 01/24/2023 1155   EOSABS 0.3 01/24/2023 1155   EOSABS 0.1 12/17/2022 0857   BASOSABS 0.0 01/24/2023 1155   BASOSABS 0.0 12/17/2022 0857   -------------------------------  Imaging from last 24 hours (if applicable): Radiology interpretation: No results found.    WRAP UP:  All questions were answered. The patient knows to call the clinic with any problems, questions or concerns.  Medical decision making: Moderate  Time spent on visit: I spent 25 minutes counseling the patient face to face. The total time spent in the appointment was 40 minutes and more than 50% was on counseling.  Carnella Guadalajara, PA-C  01/30/23 1:00 PM

## 2023-01-30 ENCOUNTER — Other Ambulatory Visit: Payer: Self-pay | Admitting: *Deleted

## 2023-01-30 ENCOUNTER — Inpatient Hospital Stay (HOSPITAL_BASED_OUTPATIENT_CLINIC_OR_DEPARTMENT_OTHER): Payer: No Typology Code available for payment source | Admitting: Physician Assistant

## 2023-01-30 VITALS — BP 128/31 | HR 65 | Temp 99.2°F | Resp 16 | Wt 139.2 lb

## 2023-01-30 DIAGNOSIS — L270 Generalized skin eruption due to drugs and medicaments taken internally: Secondary | ICD-10-CM | POA: Diagnosis not present

## 2023-01-30 DIAGNOSIS — Z5112 Encounter for antineoplastic immunotherapy: Secondary | ICD-10-CM | POA: Diagnosis not present

## 2023-01-30 DIAGNOSIS — J302 Other seasonal allergic rhinitis: Secondary | ICD-10-CM | POA: Diagnosis not present

## 2023-01-30 MED ORDER — CETIRIZINE HCL 10 MG PO TABS
10.0000 mg | ORAL_TABLET | Freq: Every day | ORAL | 3 refills | Status: DC
Start: 2023-01-30 — End: 2023-06-20

## 2023-01-30 MED ORDER — GABAPENTIN 300 MG PO CAPS: 300.0000 mg | ORAL_CAPSULE | Freq: Two times a day (BID) | ORAL | 0 refills | Status: DC

## 2023-01-30 NOTE — Patient Instructions (Signed)
[facility] at [facility] **VISIT SUMMARY & IMPORTANT INSTRUCTIONS **   You were seen today by Rojelio Brenner PA-C for your symptom management visit.    RASH: The rash on your legs and elbow is most likely due to your Keytruda (pembrolizumab), which can cause your immune system to be overactive and cause rashes and skin irritation. Continue to use betamethasone cream twice daily on the affected area. If your symptoms do not improve in the next 72 hours, please let us know. If you have any worsening rash, or rash that spreads to new areas, let us know immediately so that we can prescribe higher potency medication. Start taking cetirizine 10 mg each night at bedtime to help with itching as well as your eye irritation.  MEDICATIONS: Continue your Xeloda as prescribed.  We will continue to give you Keytruda infusions as scheduled.  FOLLOW-UP APPOINTMENT: Follow-up as scheduled with Dr. Ellin Saba on 02/14/2023  ** Thank you for trusting me with your healthcare!  I strive to provide all of my patients with quality care at each visit.  If you receive a survey for this visit, I would be so grateful to you for taking the time to provide feedback.  Thank you in advance!  ~ Anabelen Kaminsky                   Dr. [name]   &   Rojelio Brenner, PA-C   - - - - - - - - - - - - - - - - - -    Thank you for choosing [facility] at [facility] to provide your oncology and hematology care.  To afford each patient quality time with our provider, please arrive at least 15 minutes before your scheduled appointment time.   If you have a lab appointment with the Cancer Center please come in thru the Main Entrance and check in at the main information desk.  You need to re-schedule your appointment should you arrive 10 or more minutes late.  We strive to give you quality time with our providers, and arriving late affects you and other patients whose  appointments are after yours.  Also, if you no show three or more times for appointments you may be dismissed from the clinic at the providers discretion.     Again, thank you for choosing [facility].  Our hope is that these requests will decrease the amount of time that you wait before being seen by our physicians.       _____________________________________________________________  Should you have questions after your visit to [facility], please contact our office at [phone] and follow the prompts.  Our office hours are 8:00 a.m. and 4:30 p.m. Monday - Friday.  Please note that voicemails left after 4:00 p.m. may not be returned until the following business day.  We are closed weekends and major holidays.  You do have access to a nurse 24-7, just call the main number to the clinic [phone] and do not press any options, hold on the line and a nurse will answer the phone.    For prescription refill requests, have your pharmacy contact our office and allow 72 hours.

## 2023-02-05 ENCOUNTER — Other Ambulatory Visit: Payer: Self-pay

## 2023-02-08 ENCOUNTER — Other Ambulatory Visit (HOSPITAL_COMMUNITY): Payer: Self-pay

## 2023-02-11 ENCOUNTER — Other Ambulatory Visit (HOSPITAL_COMMUNITY): Payer: Self-pay

## 2023-02-11 ENCOUNTER — Other Ambulatory Visit: Payer: Self-pay

## 2023-02-11 NOTE — Progress Notes (Signed)
Specialty Pharmacy Ongoing Clinical Assessment Note  Marilyn Ford is a 62 y.o. female who is being followed by the specialty pharmacy service for Rx[facility] Oncology   Patient's specialty medication(s) reviewed today: Capecitabine   Missed doses in the last 4 weeks: 0   Patient did not have any additional questions or concerns.   Therapeutic benefit summary: Unable to assess   Adverse events/side effects summary: (S) Experienced adverse events/side effects (Uses Betamethasone on Feet and Lidocaine for mouth sores.)   Patient's therapy is appropriate to: Continue    Goals Addressed             This Visit's Progress    Slow Disease Progression       Patient is on track. Patient will maintain adherence         Follow up:  6 months

## 2023-02-11 NOTE — Progress Notes (Signed)
Specialty Pharmacy Refill Coordination Note  Marilyn Ford is a 62 y.o. female contacted today regarding refills of specialty medication(s) Capecitabine   Patient requested Delivery   Delivery date: 02/11/23   Verified address: No data recorded  Medication will be filled on 02/11/23.

## 2023-02-13 ENCOUNTER — Other Ambulatory Visit (HOSPITAL_COMMUNITY): Payer: Self-pay

## 2023-02-13 NOTE — Progress Notes (Signed)
[facility] [address] [place] [address]    Clinic Day:  02/14/23    Referring physician: Mechele Claude, MD  Patient Care Team: Mechele Claude, MD as PCP - General (Family Medicine) Mechele Claude, MD (Family Medicine) Danella Maiers, [facility] as Pharmacist (Family Medicine) Marguerita Merles, Reuel Boom, MD as Consulting Physician (Gastroenterology) Franky Macho, MD as Consulting Physician (General Surgery) Pershing Proud, RN as Oncology Nurse Navigator [name], RN as Oncology Nurse Navigator [name], MD as Medical Oncologist (Medical Oncology) Therese Sarah, RN as Oncology Nurse Navigator (Medical Oncology) [name], MD as Consulting Physician (Hematology) Mechele Claude, MD as Referring Physician (Family Medicine)   ASSESSMENT & PLAN:   Assessment: 1.  Inflammatory right breast cancer: - Bilateral diagnostic mammogram (02/20/2022): Suspicious right axillary lymphadenopathy.  Indeterminate intramammary lymph node in the right breast at 9:00.  New right breast skin and trabecular thickening, related to vascular congestion from enlarged lymph nodes in the right axilla. - Right axillary lymph node core biopsy (02/20/2022): Morphology and IHC compatible with primary breast cancer with differential diagnosis of urothelial carcinoma and primary lung carcinoma (squamous).  Grade 3.  ER 0%, PR 0%, HER2 2+, Ki-67 60%, HER2 negative by FISH. - Right breast lymph node biopsy at 9:00 (02/20/2022): Negative for carcinoma. - MRI breast (03/02/2022): In the upper outer right breast, mid to posterior depth there is a patchy clumped non-mass enhancement in a linear orientation spanning approximately 3.8 cm.  There is diffuse thickening of the skin in the right breast with skin enhancement.  Left breast with no mass or abnormal enhancement.  Numerous bulky matted lymph nodes in the right axilla. - Right breast UOQ biopsy (03/19/2022): Benign  breast with fibrocystic changes including stromal fibrosis, adenosis, usual ductal hyperplasia.  Negative for carcinoma. - PET scan (03/22/2022): Bulky hypermetabolic right axillary and subpectoral lymphadenopathy.  No other definite new sites of metastatic disease.  6 mm right lower lobe lung nodule with no FDG uptake. - Neoadjuvant chemotherapy with weekly paclitaxel and pembrolizumab from 04/02/2022 through 06/21/2022,  AC and pembrolizumab from 07/12/2022 through 09/27/2022 - Right mastectomy and lymph node excision (11/01/2022): YpT0, YPN1A, 2/3 lymph nodes positive for metastatic disease, no ECE.  Surgical margins negative. - Germline mutation testing: Negative - Adjuvant pembrolizumab and Xeloda (CREATE-X) started on 12/05/2022, Xeloda dose reduced to 2 tablets twice daily with cycle 2 due to mucositis and feet hurting with skin peeling.    Plan: 1.  Triple negative right breast cancer: - She is tolerating reduced dose of Xeloda 2 tablets twice daily 2 weeks on/1 week off reasonably well. - She denies any immunotherapy related side effects. - Reviewed labs today: Creatinine mildly elevated at 1.06.  CBC shows hemoglobin 9.1 with mild leukopenia and thrombocytopenia.  I have requested ferritin and iron panel. - Proceed with Xeloda for cycle 4 2 tablets twice daily 2 weeks on/1 week off.  Proceed with Keytruda today.  RTC 3 weeks for follow-up.   2.  Hypomagnesemia: - Continue magnesium twice daily.  Magnesium is normal today.   3.  Mild leukopenia and thrombocytopenia: - Mild leukopenia and thrombocytopenia from bone marrow suppression.  ANC is normal.    Orders Placed This Encounter  Procedures   Iron and TIBC ([facility] ONLY)   Ferritin      I,Helena R Teague,acting as a scribe for [name], MD.,have documented all relevant documentation on the behalf of [name], MD,as directed by  [employer]  Ellin Saba, MD while in the presence of [name], MD.  I, [name] MD, have reviewed the above documentation for accuracy and completeness, and I agree with the above.       [name], MD   10/10/20246:05 PM  CHIEF COMPLAINT:   Diagnosis: locally advanced TNBC    Cancer Staging  Malignant neoplasm of overlapping sites of right female breast [facility]) Staging form: Breast, AJCC 8th Edition - Clinical stage from 03/08/2022: Stage IIIC (cT4d, cN2, cM0, G3, ER-, PR-, HER2-) - Signed by Malachy Mood, MD on 03/08/2022    Prior Therapy: 1. carboplatin and paclitaxel, 4 cycles, 04/02/22 - 06/06/22 2. Pembrolizumab, 04/02/22 - 09/27/22 3. Adriamycin and Cytoxan, 4 cycles, 07/12/22 - 09/27/22  Current Therapy:  pending surgery   HISTORY OF PRESENT ILLNESS:   Oncology History Overview Note   Cancer Staging  Malignant neoplasm of overlapping sites of right female breast [facility]) Staging form: Breast, AJCC 8th Edition - Clinical stage from 03/08/2022: Stage IIIC (cT2, cN2, cM0, G3, ER-, PR-, HER2-) - Signed by Malachy Mood, MD on 03/08/2022    Malignant neoplasm of overlapping sites of right female breast [facility])  02/20/2022 Mammogram   CLINICAL DATA:  62 year old female recalled from screening mammography 03/01/2021 for right breast calcifications and subsequent benign discordant biopsy of these calcifications in the central posterior right breast December 2022 with excision recommended. The patient initially followed up with surgery in April however canceled her scheduled surgery and most recently followed up with Dr. Henreitta Leber September 2023 with diagnostic imaging, possible RF tag placement and subsequent excision recommended.   EXAM: DIGITAL DIAGNOSTIC BILATERAL MAMMOGRAM WITH TOMOSYNTHESIS; ULTRASOUND RIGHT BREAST LIMITED  MPRESSION: 1.  Suspicious right axillary lymphadenopathy.   2. Indeterminate intramammary lymph node in the right breast at 9 o'clock.   3. New right breast skin and trabecular thickening,  possibly related to vascular congestion from enlarged lymph nodes in the right axilla although most concerning for inflammatory breast cancer.   4. Decreased calcifications noted at prior benign biopsy site in the lower central posterior right breast at site of X shaped biopsy marking clip.   02/20/2022 Initial Biopsy   FINAL MICROSCOPIC DIAGNOSIS:   A. AXILLA, RIGHT, LYMPH NODE, NEEDLE CORE BIOPSY:  - Positive for carcinoma (see Comment)   B. LYMPH NODE, RIGHT BREAST, BIOPSY:  - Negative for carcinoma   COMMENT:  Part A: Morphology and immunohistochemical staining are most compatible with primary breast carcinoma with metaplastic changes, however differential diagnosis also includes urothelial carcinoma and less likely primary lung carcinoma (squamous).  No lymphoid tissue is identified.  Clinical and radiologic correlation is suggested.   ADDENDUM:  In case of a breast origin, the appropriate grade would be grade 3  (3+3+2)   ADDENDUM:  PROGNOSTIC INDICATOR RESULTS:  The tumor cells are EQUIVOCAL for Her2 (2+).  Estrogen Receptor:       0%, NEGATIVE  Progesterone Receptor:   0%, NEGATIVE  Proliferation Marker Ki-67:   60%   ADDENDUM:  FLOURESCENCE IN-SITU HYBRIDIZATION RESULTS:  GROUP 5:   HER2 **NEGATIVE**    03/02/2022 Imaging   EXAM: BILATERAL BREAST MRI WITH AND WITHOUT CONTRAST  IMPRESSION: 1. There is a suspicious 3.8 cm area of patchy non mass enhancement in a linear orientation in the slightly upper outer right breast in the mid to posterior depth spanning 3.8 cm.   2. Diffuse skin thickening with enhancement of the skin, concerning for inflammatory breast cancer.   3. Numerous bulky matted lymph nodes  in the right axilla, one of which corresponds with the biopsy-proven metastatic lymph node.   4.  No evidence of left breast malignancy.   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)   03/08/2022 Cancer  Staging   Staging form: Breast, AJCC 8th Edition - Clinical stage from 03/08/2022: Stage IIIC (cT4d, cN2, cM0, G3, ER-, PR-, HER2-) - Signed by Malachy Mood, MD on 03/08/2022 Histologic grading system: 3 grade system   03/19/2022 Pathology Results   Diagnosis Breast, right, needle core biopsy, upper outer quadrant, barbell clip BENIGN BREAST WITH FIBROCYSTIC CHANGES INCLUDING STROMAL FIBROSIS, ADENOSIS AND USUAL DUCT HYPERPLASIA BENIGN FIBROMATOID CHANGE NEGATIVE FOR MICROCALCIFICATIONS NEGATIVE FOR CARCINOMA   03/22/2022 PET scan   IMPRESSION: Bulky hypermetabolic right axillary and subpectoral lymphadenopathy, consistent with metastatic disease.   No other definite sites of metastatic disease identified.   6 mm right lower lobe pulmonary nodule shows no FDG uptake, but is too small to definitively characterize by PET. Recommend continued follow-up by chest CT in 3-4 months.   Aortic Atherosclerosis (ICD10-I70.0).   04/02/2022 - 09/28/2022 Chemotherapy   Patient is on Treatment Plan : BREAST Pembrolizumab (200) D1 + Carboplatin (5) D1 + Paclitaxel (80) D1,8,15 q21d X 4 cycles / Pembrolizumab (200) D1 + AC D1 q21d x 4 cycles      Genetic Testing   Negative genetic testing. No pathogenic variants identified on the [facility] Common Hereditary Cancers+RNA panel. The report date is 05/31/2022.  The Common Hereditary Cancers Panel + RNA offered by [facility] includes sequencing and/or deletion duplication testing of the following 48 genes: APC*, ATM*, AXIN2, BAP1, BARD1, BMPR1A, BRCA1, BRCA2, BRIP1, CDH1, CDK4, CDKN2A (p14ARF), CDKN2A (p16INK4a), CHEK2, CTNNA1, DICER1*, EPCAM*, FH*, GREM1*, HOXB13, KIT, MBD4, MEN1*, MLH1*, MSH2*, MSH3*, MSH6*, MUTYH, NF1*, NTHL1, PALB2, PDGFRA, PMS2*, POLD1*, POLE, PTEN*, RAD51C, RAD51D, SDHA*, SDHB, SDHC*, SDHD, SMAD4, SMARCA4, STK11, TP53, TSC1*, TSC2, VHL.    12/12/2022 -  Chemotherapy   Patient is on Treatment Plan : BREAST Pembrolizumab (200) q21d x 27 weeks         INTERVAL HISTORY:   Marilyn Ford is a 62 y.o. female presenting to clinic today for follow up of locally advanced TNBC. She was last seen by me on 01/17/23. She was last seen by Rojelio Brenner, PA on 01/30/23.   Today, she states that she is doing well overall. Her appetite level is at 100%. Her energy level is at 90%. She is accompanied by her husband.   She had an erythematous rash after her last visit that has resolved. She is taking Keytruda as prescribed, reporting mouth soreness and one episode of diarrhea this morning. She started cycle 4 of treatment today. She denies any redness on the palms or soles, nausea, or vomiting. She notes it is still difficult for her to open objects.   PAST MEDICAL HISTORY:   Past Medical History: Past Medical History:  Diagnosis Date   Allergy    Anxiety    Breast cancer (HCC)    Diabetes mellitus without complication (HCC)    GERD (gastroesophageal reflux disease)    Heart murmur    as a child   History of kidney stones    Hypertension    Pneumonia    Stroke [facility])    Vaginal Pap smear, abnormal     Surgical History: Past Surgical History:  Procedure Laterality Date   BREAST BIOPSY  10/31/2022   [place] RT RADIOACTIVE SEED LOC 10/31/2022 [facility] MAMMOGRAPHY  CESAREAN SECTION     CHOLECYSTECTOMY     COLONOSCOPY WITH PROPOFOL N/A 07/19/2020   Procedure: COLONOSCOPY WITH PROPOFOL;  Surgeon: Dolores Frame, MD;  Location[facility];  Service: Gastroenterology;  Laterality: N/A;  AM   POLYPECTOMY  07/19/2020   Procedure: POLYPECTOMY;  Surgeon: Marguerita Merles, Reuel Boom, MD;  Location: [facility];  Service: Gastroenterology;;   PORTACATH PLACEMENT N/A 03/20/2022   Procedure: INSERTION PORT-A-CATH;  Surgeon: Abigail Miyamoto, MD;  Location: [facility];  Service: General;  Laterality: N/A;   RADIOACTIVE SEED GUIDED AXILLARY SENTINEL LYMPH NODE Right 11/01/2022   Procedure: RADIOACTIVE SEED GUIDED RIGHT AXILLARY SENTINEL LYMPH NODE  DISSECTION;  Surgeon: Abigail Miyamoto, MD;  Location: [facility];  Service: General;  Laterality: Right;   SIMPLE MASTECTOMY WITH AXILLARY SENTINEL NODE BIOPSY Right 11/01/2022   Procedure: RIGHT SIMPLE MASTECTOMY;  Surgeon: Abigail Miyamoto, MD;  Location: [facility];  Service: General;  Laterality: Right;    Social History: Social History   Socioeconomic History   Marital status: Married    Spouse name: Marilyn Ford   Number of children: 2   Years of education: 10   Highest education level: 10th grade  Occupational History   Occupation: disabled  Tobacco Use   Smoking status: Former    Current packs/day: 0.00    Average packs/day: 0.5 packs/day for 25.0 years (12.5 ttl pk-yrs)    Types: Cigarettes    Start date: 12/08/1999    Quit date: 03/12/2015    Years since quitting: 7.9   Smokeless tobacco: Never  Vaping Use   Vaping status: Never Used  Substance and Sexual Activity   Alcohol use: Yes    Alcohol/week: 2.0 - 3.0 standard drinks of alcohol    Types: 2 - 3 Glasses of wine per week   Drug use: No   Sexual activity: Not Currently    Birth control/protection: Abstinence, Post-menopausal  Other Topics Concern   Not on file  Social History Narrative   Volunteers at [employer] for a few hours every day.    She really enjoys getting out of of the house and working there.    Social Determinants of Health   Financial Resource Strain: Low Risk  (09/08/2022)   Overall Financial Resource Strain (CARDIA)    Difficulty of Paying Living Expenses: Not very hard  Food Insecurity: No Food Insecurity (12/13/2022)   Hunger Vital Sign    Worried About Running Out of Food in the Last Year: Never true    Ran Out of Food in the Last Year: Never true  Transportation Needs: No Transportation Needs (12/13/2022)   PRAPARE - Administrator, Civil Service (Medical): No    Lack of Transportation (Non-Medical): No  Physical Activity: Inactive (09/08/2022)    Exercise Vital Sign    Days of Exercise per Week: 0 days    Minutes of Exercise per Session: 0 min  Stress: Stress Concern Present (09/08/2022)   [employer] of Occupational Health - Occupational Stress Questionnaire    Feeling of Stress : To some extent  Social Connections: Unknown (09/08/2022)   Social Connection and Isolation Panel [NHANES]    Frequency of Communication with Friends and Family: More than three times a week    Frequency of Social Gatherings with Friends and Family: Once a week    Attends Religious Services: Patient declined    Database administrator or Organizations: No    Attends Banker Meetings: Never  Marital Status: Married  Catering manager Violence: Not At Risk (12/13/2022)   Humiliation, Afraid, Rape, and Kick questionnaire    Fear of Current or Ex-Partner: No    Emotionally Abused: No    Physically Abused: No    Sexually Abused: No    Family History: Family History  Problem Relation Age of Onset   Diabetes Mother    Uterine cancer Mother 67 - 42   Diabetes Brother    Breast cancer Maternal Aunt        dx >50, d. from cancer   Cancer Maternal Grandmother        unk type, "back cancer?"    Current Medications:  Current Outpatient Medications:    Alcohol Swabs (B-D SINGLE USE SWABS REGULAR) PADS, Test BS daily and as needed Dx E11.9, Disp: 100 each, Rfl: 3   aluminum-magnesium hydroxide-simethicone (MAALOX) 200-200-20 MG/5ML SUSP, Take 30 mLs by mouth 4 (four) times daily -  before meals and at bedtime., Disp: 480 mL, Rfl: 2   amLODipine (NORVASC) 10 MG tablet, 1 tablet daily, Disp: 90 tablet, Rfl: 3   Apple Cider Vinegar 500 MG TABS, Take 500 mg by mouth in the morning., Disp: , Rfl:    aspirin 81 MG EC tablet, Take 1 tablet (81 mg total) by mouth daily., Disp: 30 tablet, Rfl: 0   atorvastatin (LIPITOR) 80 MG tablet, TAKE 1 TABLET EVERY DAY AT 6PM, Disp: 90 tablet, Rfl: 3   betamethasone dipropionate 0.05 % cream, Apply topically  2 (two) times daily., Disp: 30 g, Rfl: 0   Blood Glucose Calibration (TRUE METRIX LEVEL 1) Low SOLN, Use with glucometer Dx E11.9, Disp: 3 each, Rfl: 0   Blood Glucose Monitoring Suppl (TRUE METRIX AIR GLUCOSE METER) w/Device KIT, Test BS daily and as needed Dx E11.9, Disp: 1 kit, Rfl: 0   Calcium Carb-Cholecalciferol (CALCIUM-VITAMIN D) 600-400 MG-UNIT TABS, Take 1 tablet by mouth in the morning., Disp: , Rfl:    capecitabine (XELODA) 500 MG tablet, Take 2 tablets (1,000 mg total) by mouth 2 (two) times daily after a meal. Take for 14 days, then hold for 7 days. Repeat every 21 days., Disp: 56 tablet, Rfl: 5   cetirizine (ZYRTEC) 10 MG tablet, Take 1 tablet (10 mg total) by mouth daily., Disp: 30 tablet, Rfl: 3   Coenzyme Q10 (COQ10) 100 MG CAPS, Take 100 mg by mouth in the morning., Disp: , Rfl:    Cranberry 425 MG CAPS, Take 425 mg by mouth in the morning and at bedtime., Disp: , Rfl:    diphenoxylate-atropine (LOMOTIL) 2.5-0.025 MG tablet, Take 1 tablet by mouth 4 (four) times daily as needed for diarrhea or loose stools., Disp: 30 tablet, Rfl: 0   Flaxseed, Linseed, (FLAXSEED OIL) 1000 MG CAPS, Take 1,000 mg by mouth in the morning., Disp: , Rfl:    gabapentin (NEURONTIN) 300 MG capsule, Take 1 capsule (300 mg total) by mouth 2 (two) times daily., Disp: 60 capsule, Rfl: 0   glucose blood (TRUE METRIX BLOOD GLUCOSE TEST) test strip, Test BS daily and as needed Dx E11.9, Disp: 100 each, Rfl: 3   icosapent Ethyl (VASCEPA) 1 g capsule, Take 2 g by mouth 2 (two) times daily., Disp: , Rfl:    Krill Oil 500 MG CAPS, Take 3 capsules (1,500 mg total) by mouth in the morning and at bedtime., Disp: , Rfl:    lidocaine (XYLOCAINE) 2 % solution, Use as directed 15 mLs in the mouth or throat every 6 (six)  hours as needed for mouth pain., Disp: 250 mL, Rfl: 1   LORazepam (ATIVAN) 1 MG tablet, Take 1 tablet (1 mg total) by mouth 2 (two) times daily as needed for anxiety., Disp: 10 tablet, Rfl: 0   magnesium  oxide (MAG-OX) 400 (240 Mg) MG tablet, TAKE 1 TABLET BY MOUTH TWICE A DAY, Disp: 60 tablet, Rfl: 1   meloxicam (MOBIC) 15 MG tablet, TAKE 1 TABLET (15 MG TOTAL) BY MOUTH DAILY. FOR JOINT AND MUSCLE PAIN, Disp: 90 tablet, Rfl: 1   metoprolol (TOPROL-XL) 200 MG 24 hr tablet, TAKE 1 TABLET ONE TIME DAILY, WITH OR IMMEDIATELY FOLLOWING A MEAL, Disp: 90 tablet, Rfl: 3   Multiple Vitamin (MULTIVITAMIN) capsule, Take 1 capsule by mouth in the morning., Disp: , Rfl:    ondansetron (ZOFRAN) 8 MG tablet, Take 1 tablet (8 mg total) by mouth every 8 (eight) hours as needed for nausea or vomiting., Disp: 30 tablet, Rfl: 3   pantoprazole (PROTONIX) 40 MG tablet, TAKE 1 TABLET twice daily FOR STOMACH, Disp: 180 tablet, Rfl: 3   traZODone (DESYREL) 150 MG tablet, TAKE 1 OR 2 TABLETS AT BEDTIME FOR SLEEP, Disp: 180 tablet, Rfl: 3   TRUEplus Lancets 33G MISC, Test BS daily and as needed Dx E11.9, Disp: 100 each, Rfl: 3   valsartan (DIOVAN) 320 MG tablet, Take 1 tablet (320 mg total) by mouth daily. For blood pressure., Disp: 90 tablet, Rfl: 3   zinc gluconate 50 MG tablet, Take 50 mg by mouth daily., Disp: , Rfl:    Zinc Oxide 20 % PSTE, Apply 1 Application topically 3 (three) times daily., Disp: 57 g, Rfl: 1   Allergies: Allergies  Allergen Reactions   Penicillins Shortness Of Breath   Sulfa Antibiotics Rash    REVIEW OF SYSTEMS:   Review of Systems  Constitutional:  Negative for chills, fatigue and fever.  HENT:   Negative for lump/mass, mouth sores, nosebleeds, sore throat and trouble swallowing.   Eyes:  Negative for eye problems.  Respiratory:  Negative for cough and shortness of breath.   Cardiovascular:  Negative for chest pain, leg swelling and palpitations.  Gastrointestinal:  Negative for abdominal pain, constipation, diarrhea, nausea and vomiting.  Genitourinary:  Negative for bladder incontinence, difficulty urinating, dysuria, frequency, hematuria and nocturia.   Musculoskeletal:  Negative  for arthralgias, back pain, flank pain, myalgias and neck pain.  Skin:  Negative for itching and rash.  Neurological:  Negative for dizziness, headaches and numbness.  Hematological:  Does not bruise/bleed easily.  Psychiatric/Behavioral:  Negative for depression, sleep disturbance and suicidal ideas. The patient is not nervous/anxious.   All other systems reviewed and are negative.    VITALS:   There were no vitals taken for this visit.  Wt Readings from Last 3 Encounters:  02/14/23 143 lb 6.4 oz (65 kg)  01/30/23 139 lb 3.2 oz (63.1 kg)  01/28/23 137 lb 4 oz (62.3 kg)    There is no height or weight on file to calculate BMI.  Performance status (ECOG): 1 - Symptomatic but completely ambulatory  PHYSICAL EXAM:   Physical Exam Vitals and nursing note reviewed. Exam conducted with a chaperone present.  Constitutional:      Appearance: Normal appearance.  Cardiovascular:     Rate and Rhythm: Normal rate and regular rhythm.     Pulses: Normal pulses.     Heart sounds: Normal heart sounds.  Pulmonary:     Effort: Pulmonary effort is normal.     Breath  sounds: Normal breath sounds.  Abdominal:     Palpations: Abdomen is soft. There is no hepatomegaly, splenomegaly or mass.     Tenderness: There is no abdominal tenderness.  Musculoskeletal:     Right lower leg: No edema.     Left lower leg: No edema.  Lymphadenopathy:     Cervical: No cervical adenopathy.     Right cervical: No superficial, deep or posterior cervical adenopathy.    Left cervical: No superficial, deep or posterior cervical adenopathy.     Upper Body:     Right upper body: No supraclavicular or axillary adenopathy.     Left upper body: No supraclavicular or axillary adenopathy.  Neurological:     General: No focal deficit present.     Mental Status: She is alert and oriented to person, place, and time.  Psychiatric:        Mood and Affect: Mood normal.        Behavior: Behavior normal.     LABS:       Latest Ref Rng & Units 02/14/2023    9:31 AM 01/24/2023   11:55 AM 01/17/2023    8:27 AM  CBC  WBC 4.0 - 10.5 K/uL 3.9  3.7  3.3   Hemoglobin 12.0 - 15.0 g/dL 9.1  9.1  8.6   Hematocrit 36.0 - 46.0 % 26.7  26.9  24.9   Platelets 150 - 400 K/uL 105  115  123       Latest Ref Rng & Units 02/14/2023    9:31 AM 01/24/2023   11:55 AM 01/17/2023    8:27 AM  CMP  Glucose 70 - 99 mg/dL 161  096  045   BUN 8 - 23 mg/dL 24  23  23    Creatinine 0.44 - 1.00 mg/dL 4.09  8.11  9.14   Sodium 135 - 145 mmol/L 136  138  135   Potassium 3.5 - 5.1 mmol/L 4.2  4.2  4.3   Chloride 98 - 111 mmol/L 103  103  100   CO2 22 - 32 mmol/L 27  27  25    Calcium 8.9 - 10.3 mg/dL 8.6  8.9  8.7   Total Protein 6.5 - 8.1 g/dL 6.6  6.4  6.2   Total Bilirubin 0.3 - 1.2 mg/dL 1.1  0.9  0.9   Alkaline Phos 38 - 126 U/L 92  89  86   AST 15 - 41 U/L 21  19  21    ALT 0 - 44 U/L 20  22  23       No results found for: "CEA1", "CEA" / No results found for: "CEA1", "CEA" No results found for: "PSA1" No results found for: "NWG956" No results found for: "CAN125"  No results found for: "TOTALPROTELP", "ALBUMINELP", "A1GS", "A2GS", "BETS", "BETA2SER", "GAMS", "MSPIKE", "SPEI" Lab Results  Component Value Date   TIBC 350 02/14/2023   FERRITIN 599 (H) 02/14/2023   IRONPCTSAT 39 (H) 02/14/2023   No results found for: "LDH"   STUDIES:   No results found.

## 2023-02-14 ENCOUNTER — Inpatient Hospital Stay: Payer: No Typology Code available for payment source | Attending: Hematology and Oncology

## 2023-02-14 ENCOUNTER — Inpatient Hospital Stay: Payer: No Typology Code available for payment source

## 2023-02-14 ENCOUNTER — Inpatient Hospital Stay (HOSPITAL_BASED_OUTPATIENT_CLINIC_OR_DEPARTMENT_OTHER): Payer: No Typology Code available for payment source | Admitting: Hematology

## 2023-02-14 VITALS — BP 120/59 | HR 62 | Temp 97.9°F | Resp 18

## 2023-02-14 DIAGNOSIS — Z171 Estrogen receptor negative status [ER-]: Secondary | ICD-10-CM

## 2023-02-14 DIAGNOSIS — Z5112 Encounter for antineoplastic immunotherapy: Secondary | ICD-10-CM | POA: Diagnosis not present

## 2023-02-14 DIAGNOSIS — C50811 Malignant neoplasm of overlapping sites of right female breast: Secondary | ICD-10-CM | POA: Diagnosis not present

## 2023-02-14 DIAGNOSIS — Z87891 Personal history of nicotine dependence: Secondary | ICD-10-CM | POA: Diagnosis not present

## 2023-02-14 DIAGNOSIS — Z79899 Other long term (current) drug therapy: Secondary | ICD-10-CM | POA: Diagnosis not present

## 2023-02-14 DIAGNOSIS — D508 Other iron deficiency anemias: Secondary | ICD-10-CM

## 2023-02-14 LAB — CBC WITH DIFFERENTIAL/PLATELET
Abs Immature Granulocytes: 0.01 10*3/uL (ref 0.00–0.07)
Basophils Absolute: 0 10*3/uL (ref 0.0–0.1)
Basophils Relative: 0 %
Eosinophils Absolute: 0.1 10*3/uL (ref 0.0–0.5)
Eosinophils Relative: 3 %
HCT: 26.7 % — ABNORMAL LOW (ref 36.0–46.0)
Hemoglobin: 9.1 g/dL — ABNORMAL LOW (ref 12.0–15.0)
Immature Granulocytes: 0 %
Lymphocytes Relative: 13 %
Lymphs Abs: 0.5 10*3/uL — ABNORMAL LOW (ref 0.7–4.0)
MCH: 38.7 pg — ABNORMAL HIGH (ref 26.0–34.0)
MCHC: 34.1 g/dL (ref 30.0–36.0)
MCV: 113.6 fL — ABNORMAL HIGH (ref 80.0–100.0)
Monocytes Absolute: 0.3 10*3/uL (ref 0.1–1.0)
Monocytes Relative: 7 %
Neutro Abs: 3 10*3/uL (ref 1.7–7.7)
Neutrophils Relative %: 77 %
Platelets: 105 10*3/uL — ABNORMAL LOW (ref 150–400)
RBC: 2.35 MIL/uL — ABNORMAL LOW (ref 3.87–5.11)
RDW: 20.3 % — ABNORMAL HIGH (ref 11.5–15.5)
WBC: 3.9 10*3/uL — ABNORMAL LOW (ref 4.0–10.5)
nRBC: 0 % (ref 0.0–0.2)

## 2023-02-14 LAB — COMPREHENSIVE METABOLIC PANEL
ALT: 20 U/L (ref 0–44)
AST: 21 U/L (ref 15–41)
Albumin: 3.8 g/dL (ref 3.5–5.0)
Alkaline Phosphatase: 92 U/L (ref 38–126)
Anion gap: 6 (ref 5–15)
BUN: 24 mg/dL — ABNORMAL HIGH (ref 8–23)
CO2: 27 mmol/L (ref 22–32)
Calcium: 8.6 mg/dL — ABNORMAL LOW (ref 8.9–10.3)
Chloride: 103 mmol/L (ref 98–111)
Creatinine, Ser: 1.06 mg/dL — ABNORMAL HIGH (ref 0.44–1.00)
GFR, Estimated: 59 mL/min — ABNORMAL LOW (ref >60)
Glucose, Bld: 195 mg/dL — ABNORMAL HIGH (ref 70–99)
Potassium: 4.2 mmol/L (ref 3.5–5.1)
Sodium: 136 mmol/L (ref 135–145)
Total Bilirubin: 1.1 mg/dL (ref 0.3–1.2)
Total Protein: 6.6 g/dL (ref 6.5–8.1)

## 2023-02-14 LAB — SAMPLE TO BLOOD BANK

## 2023-02-14 LAB — MAGNESIUM: Magnesium: 1.9 mg/dL (ref 1.7–2.4)

## 2023-02-14 LAB — IRON AND TIBC
Iron: 136 ug/dL (ref 28–170)
Saturation Ratios: 39 % — ABNORMAL HIGH (ref 10.4–31.8)
TIBC: 350 ug/dL (ref 250–450)
UIBC: 214 ug/dL

## 2023-02-14 LAB — FERRITIN: Ferritin: 599 ng/mL — ABNORMAL HIGH (ref 11–307)

## 2023-02-14 MED ORDER — HEPARIN SOD (PORK) LOCK FLUSH 100 UNIT/ML IV SOLN
500.0000 [IU] | Freq: Once | INTRAVENOUS | Status: AC | PRN
  Administered 2023-02-14: 500 [IU]

## 2023-02-14 MED ORDER — SODIUM CHLORIDE 0.9 % IV SOLN
200.0000 mg | Freq: Once | INTRAVENOUS | Status: AC
  Administered 2023-02-14: 200 mg via INTRAVENOUS
  Filled 2023-02-14: qty 8

## 2023-02-14 MED ORDER — SODIUM CHLORIDE 0.9 % IV SOLN: Freq: Once | INTRAVENOUS | Status: AC

## 2023-02-14 MED ORDER — SODIUM CHLORIDE 0.9% FLUSH
10.0000 mL | INTRAVENOUS | Status: DC | PRN
  Administered 2023-02-14: 10 mL

## 2023-02-14 MED ORDER — SODIUM CHLORIDE 0.9% FLUSH
10.0000 mL | Freq: Once | INTRAVENOUS | Status: AC
  Administered 2023-02-14: 10 mL via INTRAVENOUS

## 2023-02-14 NOTE — Progress Notes (Signed)
Patient is taking Xeloda as prescribed.  She has not missed any doses and reports no side effects at this time.    Patient has been examined by Dr. Ellin Saba. Vital signs and labs have been reviewed by MD - ANC, Creatinine, LFTs, hemoglobin, and platelets are within treatment parameters per M.D. - pt may proceed with treatment.  Primary RN and pharmacy notified.

## 2023-02-14 NOTE — Progress Notes (Signed)
Patient presents today for Keytruda infusion. Patient is in satisfactory condition with no new complaints voiced.  Vital signs are stable.  Labs reviewed by Dr. Katragadda during the office visit and all labs are within treatment parameters.  We will proceed with treatment per MD orders.   Treatment given today per MD orders. Tolerated infusion without adverse affects. Vital signs stable. No complaints at this time. Discharged from clinic ambulatory in stable condition. Alert and oriented x 3. F/U with [facility] as scheduled.   

## 2023-02-14 NOTE — Patient Instructions (Signed)

## 2023-02-14 NOTE — Patient Instructions (Signed)
[facility]  Discharge Instructions: Thank you for choosing [facility] to provide your oncology and hematology care.  If you have a lab appointment with the Cancer Center - please note that after April 8th, 2024, all labs will be drawn in the cancer center.  You do not have to check in or register with the main entrance as you have in the past but will complete your check-in in the cancer center.  Wear comfortable clothing and clothing appropriate for easy access to any Portacath or PICC line.   We strive to give you quality time with your provider. You may need to reschedule your appointment if you arrive late (15 or more minutes).  Arriving late affects you and other patients whose appointments are after yours.  Also, if you miss three or more appointments without notifying the office, you may be dismissed from the clinic at the provider's discretion.      For prescription refill requests, have your pharmacy contact our office and allow 72 hours for refills to be completed.    Today you received the following chemotherapy and/or immunotherapy agents Keytruda   To help prevent nausea and vomiting after your treatment, we encourage you to take your nausea medication as directed.  Pembrolizumab Injection What is this medication? PEMBROLIZUMAB (PEM broe LIZ ue mab) treats some types of cancer. It works by helping your immune system slow or stop the spread of cancer cells. It is a monoclonal antibody. This medicine may be used for other purposes; ask your health care provider or pharmacist if you have questions. COMMON BRAND NAME(S): Keytruda What should I tell my care team before I take this medication? They need to know if you have any of these conditions: Allogeneic stem cell transplant (uses someone else's stem cells) Autoimmune diseases, such as Crohn disease, ulcerative colitis, lupus History of chest radiation Nervous system problems, such as  Guillain-Barre syndrome, myasthenia gravis Organ transplant An unusual or allergic reaction to pembrolizumab, other medications, foods, dyes, or preservatives Pregnant or trying to get pregnant Breast-feeding How should I use this medication? This medication is injected into a vein. It is given by your care team in a hospital or clinic setting. A special MedGuide will be given to you before each treatment. Be sure to read this information carefully each time. Talk to your care team about the use of this medication in children. While it may be prescribed for children as young as 6 months for selected conditions, precautions do apply. Overdosage: If you think you have taken too much of this medicine contact a poison control center or emergency room at once. NOTE: This medicine is only for you. Do not share this medicine with others. What if I miss a dose? Keep appointments for follow-up doses. It is important not to miss your dose. Call your care team if you are unable to keep an appointment. What may interact with this medication? Interactions have not been studied. This list may not describe all possible interactions. Give your health care provider a list of all the medicines, herbs, non-prescription drugs, or dietary supplements you use. Also tell them if you smoke, drink alcohol, or use illegal drugs. Some items may interact with your medicine. What should I watch for while using this medication? Your condition will be monitored carefully while you are receiving this medication. You may need blood work while taking this medication. This medication may cause serious skin reactions. They can happen weeks to months   after starting the medication. Contact your care team right away if you notice fevers or flu-like symptoms with a rash. The rash may be red or purple and then turn into blisters or peeling of the skin. You may also notice a red rash with swelling of the face, lips, or lymph nodes in your  neck or under your arms. Tell your care team right away if you have any change in your eyesight. Talk to your care team if you may be pregnant. Serious birth defects can occur if you take this medication during pregnancy and for 4 months after the last dose. You will need a negative pregnancy test before starting this medication. Contraception is recommended while taking this medication and for 4 months after the last dose. Your care team can help you find the option that works for you. Do not breastfeed while taking this medication and for 4 months after the last dose. What side effects may I notice from receiving this medication? Side effects that you should report to your care team as soon as possible: Allergic reactions--skin rash, itching, hives, swelling of the face, lips, tongue, or throat Dry cough, shortness of breath or trouble breathing Eye pain, redness, irritation, or discharge with blurry or decreased vision Heart muscle inflammation--unusual weakness or fatigue, shortness of breath, chest pain, fast or irregular heartbeat, dizziness, swelling of the ankles, feet, or hands Hormone gland problems--headache, sensitivity to light, unusual weakness or fatigue, dizziness, fast or irregular heartbeat, increased sensitivity to cold or heat, excessive sweating, constipation, hair loss, increased thirst or amount of urine, tremors or shaking, irritability Infusion reactions--chest pain, shortness of breath or trouble breathing, feeling faint or lightheaded Kidney injury (glomerulonephritis)--decrease in the amount of urine, red or dark brown urine, foamy or bubbly urine, swelling of the ankles, hands, or feet Liver injury--right upper belly pain, loss of appetite, nausea, light-colored stool, dark yellow or brown urine, yellowing skin or eyes, unusual weakness or fatigue Pain, tingling, or numbness in the hands or feet, muscle weakness, change in vision, confusion or trouble speaking, loss of  balance or coordination, trouble walking, seizures Rash, fever, and swollen lymph nodes Redness, blistering, peeling, or loosening of the skin, including inside the mouth Sudden or severe stomach pain, bloody diarrhea, fever, nausea, vomiting Side effects that usually do not require medical attention (report to your care team if they continue or are bothersome): Bone, joint, or muscle pain Diarrhea Fatigue Loss of appetite Nausea Skin rash This list may not describe all possible side effects. Call your doctor for medical advice about side effects. You may report side effects to FDA at 1-800-FDA-1088. Where should I keep my medication? This medication is given in a hospital or clinic. It will not be stored at home. NOTE: This sheet is a summary. It may not cover all possible information. If you have questions about this medicine, talk to your doctor, pharmacist, or health care provider.  2024 Elsevier/Gold Standard (2021-09-05 00:00:00)   BELOW ARE SYMPTOMS THAT SHOULD BE REPORTED IMMEDIATELY: *FEVER GREATER THAN 100.4 F (38 C) OR HIGHER *CHILLS OR SWEATING *NAUSEA AND VOMITING THAT IS NOT CONTROLLED WITH YOUR NAUSEA MEDICATION *UNUSUAL SHORTNESS OF BREATH *UNUSUAL BRUISING OR BLEEDING *URINARY PROBLEMS (pain or burning when urinating, or frequent urination) *BOWEL PROBLEMS (unusual diarrhea, constipation, pain near the anus) TENDERNESS IN MOUTH AND THROAT WITH OR WITHOUT PRESENCE OF ULCERS (sore throat, sores in mouth, or a toothache) UNUSUAL RASH, SWELLING OR PAIN  UNUSUAL VAGINAL DISCHARGE OR ITCHING   Items   with * indicate a potential emergency and should be followed up as soon as possible or go to the Emergency Department if any problems should occur.  Please show the CHEMOTHERAPY ALERT CARD or IMMUNOTHERAPY ALERT CARD at check-in to the Emergency Department and triage nurse.  Should you have questions after your visit or need to cancel or reschedule your appointment, please  contact [facility] [phone]  and follow the prompts.  Office hours are 8:00 a.m. to 4:30 p.m. Monday - Friday. Please note that voicemails left after 4:00 p.m. may not be returned until the following business day.  We are closed weekends and major holidays. You have access to a nurse at all times for urgent questions. Please call the main number to the clinic [phone] and follow the prompts.  For any non-urgent questions, you may also contact your provider using MyChart. We now offer e-Visits for anyone 18 and older to request care online for non-urgent symptoms. For details visit [email or web address].   Also download the MyChart app! Go to the app store, search "MyChart", open the app, select [place], and log in with your MyChart username and password.   

## 2023-02-17 ENCOUNTER — Other Ambulatory Visit: Payer: Self-pay | Admitting: Family Medicine

## 2023-02-21 ENCOUNTER — Other Ambulatory Visit (HOSPITAL_COMMUNITY): Payer: Self-pay

## 2023-02-22 NOTE — Progress Notes (Signed)
Patient called stating that her toenails are coming off and that in between her toes is red. She also has mouth sores.  [name], MD- response what day xeloda is she on.  Called patient's cell phone x 2 no answer and unable to leave message.  Called patient home phone and her husband Marilyn Ford states that she is on day 1 of week 2 and that the toenail came off but does not seem to be bothering her.  [name], MD-  Use steroid cream for the redness between toes. Use magic mouthwash with lidocaine.  Patient's husband Marilyn Ford aware and agreeable with plan.

## 2023-02-26 ENCOUNTER — Other Ambulatory Visit: Payer: Self-pay

## 2023-02-26 NOTE — Progress Notes (Signed)
Specialty Pharmacy Refill Coordination Note  Marilyn Ford is a 62 y.o. female contacted today regarding refills of specialty medication(s) Capecitabine   Patient requested Delivery   Delivery date: 03/04/23   Verified address: [address]  [place] [address]   Medication will be filled on 03/01/23.

## 2023-03-04 ENCOUNTER — Other Ambulatory Visit: Payer: Self-pay | Admitting: *Deleted

## 2023-03-04 ENCOUNTER — Telehealth: Payer: Self-pay | Admitting: *Deleted

## 2023-03-04 DIAGNOSIS — D500 Iron deficiency anemia secondary to blood loss (chronic): Secondary | ICD-10-CM

## 2023-03-04 NOTE — Telephone Encounter (Signed)
Patient called stating that she has had diarrhea for 3 days and has not been drinking much.  States she "feels like her blood is low".  Admits that she has not tried her lomotil.  Instructed to increase her fluids and take anti-diarrheal and make [place] aware if she does not feel better by tomorrow.  Labs were ok on the 10th.  Will add BB sample for hold when she comes in on Thursday.

## 2023-03-07 ENCOUNTER — Inpatient Hospital Stay: Payer: No Typology Code available for payment source

## 2023-03-07 ENCOUNTER — Telehealth: Payer: Self-pay

## 2023-03-07 ENCOUNTER — Inpatient Hospital Stay: Payer: No Typology Code available for payment source | Admitting: Hematology

## 2023-03-07 VITALS — BP 124/57 | HR 60

## 2023-03-07 VITALS — BP 133/61 | HR 71 | Temp 97.0°F | Resp 20 | Wt 142.3 lb

## 2023-03-07 DIAGNOSIS — D500 Iron deficiency anemia secondary to blood loss (chronic): Secondary | ICD-10-CM

## 2023-03-07 DIAGNOSIS — R21 Rash and other nonspecific skin eruption: Secondary | ICD-10-CM | POA: Diagnosis not present

## 2023-03-07 DIAGNOSIS — Z95828 Presence of other vascular implants and grafts: Secondary | ICD-10-CM

## 2023-03-07 DIAGNOSIS — Z5112 Encounter for antineoplastic immunotherapy: Secondary | ICD-10-CM | POA: Diagnosis not present

## 2023-03-07 DIAGNOSIS — Z171 Estrogen receptor negative status [ER-]: Secondary | ICD-10-CM

## 2023-03-07 DIAGNOSIS — C50811 Malignant neoplasm of overlapping sites of right female breast: Secondary | ICD-10-CM | POA: Diagnosis not present

## 2023-03-07 LAB — COMPREHENSIVE METABOLIC PANEL
ALT: 20 U/L (ref 0–44)
AST: 22 U/L (ref 15–41)
Albumin: 3.9 g/dL (ref 3.5–5.0)
Alkaline Phosphatase: 101 U/L (ref 38–126)
Anion gap: 11 (ref 5–15)
BUN: 17 mg/dL (ref 8–23)
CO2: 26 mmol/L (ref 22–32)
Calcium: 9.3 mg/dL (ref 8.9–10.3)
Chloride: 101 mmol/L (ref 98–111)
Creatinine, Ser: 0.95 mg/dL (ref 0.44–1.00)
GFR, Estimated: >60 mL/min (ref >60)
Glucose, Bld: 230 mg/dL — ABNORMAL HIGH (ref 70–99)
Potassium: 4 mmol/L (ref 3.5–5.1)
Sodium: 138 mmol/L (ref 135–145)
Total Bilirubin: 1 mg/dL (ref 0.3–1.2)
Total Protein: 6.8 g/dL (ref 6.5–8.1)

## 2023-03-07 LAB — CBC WITH DIFFERENTIAL/PLATELET
Abs Immature Granulocytes: 0.02 10*3/uL (ref 0.00–0.07)
Basophils Absolute: 0 10*3/uL (ref 0.0–0.1)
Basophils Relative: 0 %
Eosinophils Absolute: 0.1 10*3/uL (ref 0.0–0.5)
Eosinophils Relative: 3 %
HCT: 26 % — ABNORMAL LOW (ref 36.0–46.0)
Hemoglobin: 8.8 g/dL — ABNORMAL LOW (ref 12.0–15.0)
Immature Granulocytes: 1 %
Lymphocytes Relative: 16 %
Lymphs Abs: 0.6 10*3/uL — ABNORMAL LOW (ref 0.7–4.0)
MCH: 38.8 pg — ABNORMAL HIGH (ref 26.0–34.0)
MCHC: 33.8 g/dL (ref 30.0–36.0)
MCV: 114.5 fL — ABNORMAL HIGH (ref 80.0–100.0)
Monocytes Absolute: 0.3 10*3/uL (ref 0.1–1.0)
Monocytes Relative: 8 %
Neutro Abs: 2.7 10*3/uL (ref 1.7–7.7)
Neutrophils Relative %: 72 %
Platelets: 127 10*3/uL — ABNORMAL LOW (ref 150–400)
RBC: 2.27 MIL/uL — ABNORMAL LOW (ref 3.87–5.11)
RDW: 16.8 % — ABNORMAL HIGH (ref 11.5–15.5)
WBC: 3.7 10*3/uL — ABNORMAL LOW (ref 4.0–10.5)
nRBC: 0 % (ref 0.0–0.2)

## 2023-03-07 LAB — MAGNESIUM: Magnesium: 1.9 mg/dL (ref 1.7–2.4)

## 2023-03-07 LAB — SAMPLE TO BLOOD BANK

## 2023-03-07 MED ORDER — SODIUM CHLORIDE 0.9 % IV SOLN: Freq: Once | INTRAVENOUS | Status: AC

## 2023-03-07 MED ORDER — LIDOCAINE VISCOUS HCL 2 % MT SOLN: 15.0000 mL | Freq: Four times a day (QID) | OROMUCOSAL | 1 refills | Status: DC | PRN

## 2023-03-07 MED ORDER — HEPARIN SOD (PORK) LOCK FLUSH 100 UNIT/ML IV SOLN
500.0000 [IU] | Freq: Once | INTRAVENOUS | Status: AC | PRN
  Administered 2023-03-07: 500 [IU]

## 2023-03-07 MED ORDER — SODIUM CHLORIDE 0.9 % IV SOLN
200.0000 mg | Freq: Once | INTRAVENOUS | Status: AC
  Administered 2023-03-07: 200 mg via INTRAVENOUS
  Filled 2023-03-07: qty 8

## 2023-03-07 MED ORDER — INSULIN ASPART 100 UNIT/ML IJ SOLN
10.0000 [IU] | Freq: Once | INTRAMUSCULAR | Status: DC
  Filled 2023-03-07: qty 0.1

## 2023-03-07 MED ORDER — ALUMINUM-MAGNESIUM-SIMETHICONE 200-200-20 MG/5ML PO SUSP: 30.0000 mL | Freq: Three times a day (TID) | ORAL | 2 refills | Status: DC

## 2023-03-07 MED ORDER — SODIUM CHLORIDE 0.9% FLUSH
10.0000 mL | INTRAVENOUS | Status: DC | PRN
  Administered 2023-03-07: 10 mL via INTRAVENOUS

## 2023-03-07 MED ORDER — BETAMETHASONE DIPROPIONATE 0.05 % EX CREA: TOPICAL_CREAM | Freq: Two times a day (BID) | CUTANEOUS | 0 refills | Status: AC

## 2023-03-07 NOTE — Progress Notes (Signed)
Patient's CBG 230. Pt refused to take standing order Novolog 10 units at this time.RN educated pt on the importance of monitoring blood sugar at home and when to seek emergency care. Pt verbalized understanding and all questions answered at this time.   Patient tolerated chemotherapy with no complaints voiced.  Side effects with management reviewed with understanding verbalized.  Port site clean and dry with no bruising or swelling noted at site.  Good blood return noted before and after administration of chemotherapy.  Band aid applied.  Patient left in satisfactory condition with VSS and no s/s of distress noted. All follow ups as scheduled.   Zarai Orsborn [employer]

## 2023-03-07 NOTE — Patient Instructions (Signed)
[facility]  Discharge Instructions: Thank you for choosing [facility] to provide your oncology and hematology care.  If you have a lab appointment with the Cancer Center - please note that after April 8th, 2024, all labs will be drawn in the cancer center.  You do not have to check in or register with the main entrance as you have in the past but will complete your check-in in the cancer center.  Wear comfortable clothing and clothing appropriate for easy access to any Portacath or PICC line.   We strive to give you quality time with your provider. You may need to reschedule your appointment if you arrive late (15 or more minutes).  Arriving late affects you and other patients whose appointments are after yours.  Also, if you miss three or more appointments without notifying the office, you may be dismissed from the clinic at the provider's discretion.      For prescription refill requests, have your pharmacy contact our office and allow 72 hours for refills to be completed.    Today you received the following chemotherapy and/or immunotherapy agents keytruda   To help prevent nausea and vomiting after your treatment, we encourage you to take your nausea medication as directed.  BELOW ARE SYMPTOMS THAT SHOULD BE REPORTED IMMEDIATELY: *FEVER GREATER THAN 100.4 F (38 C) OR HIGHER *CHILLS OR SWEATING *NAUSEA AND VOMITING THAT IS NOT CONTROLLED WITH YOUR NAUSEA MEDICATION *UNUSUAL SHORTNESS OF BREATH *UNUSUAL BRUISING OR BLEEDING *URINARY PROBLEMS (pain or burning when urinating, or frequent urination) *BOWEL PROBLEMS (unusual diarrhea, constipation, pain near the anus) TENDERNESS IN MOUTH AND THROAT WITH OR WITHOUT PRESENCE OF ULCERS (sore throat, sores in mouth, or a toothache) UNUSUAL RASH, SWELLING OR PAIN  UNUSUAL VAGINAL DISCHARGE OR ITCHING   Items with * indicate a potential emergency and should be followed up as soon as possible or go to the  Emergency Department if any problems should occur.  Please show the CHEMOTHERAPY ALERT CARD or IMMUNOTHERAPY ALERT CARD at check-in to the Emergency Department and triage nurse.  Should you have questions after your visit or need to cancel or reschedule your appointment, please contact [facility] [phone]  and follow the prompts.  Office hours are 8:00 a.m. to 4:30 p.m. Monday - Friday. Please note that voicemails left after 4:00 p.m. may not be returned until the following business day.  We are closed weekends and major holidays. You have access to a nurse at all times for urgent questions. Please call the main number to the clinic [phone] and follow the prompts.  For any non-urgent questions, you may also contact your provider using MyChart. We now offer e-Visits for anyone 18 and older to request care online for non-urgent symptoms. For details visit [email or web address].   Also download the MyChart app! Go to the app store, search "MyChart", open the app, select [place], and log in with your MyChart username and password.   

## 2023-03-07 NOTE — Progress Notes (Signed)
Patient is taking Xeloda as prescribed.  She has not missed any doses and reports no side effects at this time.    Patient has been examined by Dr. Ellin Saba. Vital signs and labs have been reviewed by MD - ANC, Creatinine, LFTs, hemoglobin, and platelets are within treatment parameters per M.D. - pt may proceed with treatment.  Primary RN and pharmacy notified.

## 2023-03-07 NOTE — Progress Notes (Signed)
[facility] [address] [place] [address]    Clinic Day:  03/07/23    Referring physician: Mechele Claude, MD  Patient Care Team: Mechele Claude, MD as PCP - General (Family Medicine) Mechele Claude, MD (Family Medicine) Danella Maiers, [facility] as Pharmacist (Family Medicine) Marguerita Merles, Reuel Boom, MD as Consulting Physician (Gastroenterology) Franky Macho, MD as Consulting Physician (General Surgery) Pershing Proud, RN as Oncology Nurse Navigator [name], RN as Oncology Nurse Navigator [name], MD as Medical Oncologist (Medical Oncology) Therese Sarah, RN as Oncology Nurse Navigator (Medical Oncology) [name], MD as Consulting Physician (Hematology) Mechele Claude, MD as Referring Physician (Family Medicine)   ASSESSMENT & PLAN:   Assessment: 1.  Inflammatory right breast cancer: - Bilateral diagnostic mammogram (02/20/2022): Suspicious right axillary lymphadenopathy.  Indeterminate intramammary lymph node in the right breast at 9:00.  New right breast skin and trabecular thickening, related to vascular congestion from enlarged lymph nodes in the right axilla. - Right axillary lymph node core biopsy (02/20/2022): Morphology and IHC compatible with primary breast cancer with differential diagnosis of urothelial carcinoma and primary lung carcinoma (squamous).  Grade 3.  ER 0%, PR 0%, HER2 2+, Ki-67 60%, HER2 negative by FISH. - Right breast lymph node biopsy at 9:00 (02/20/2022): Negative for carcinoma. - MRI breast (03/02/2022): In the upper outer right breast, mid to posterior depth there is a patchy clumped non-mass enhancement in a linear orientation spanning approximately 3.8 cm.  There is diffuse thickening of the skin in the right breast with skin enhancement.  Left breast with no mass or abnormal enhancement.  Numerous bulky matted lymph nodes in the right axilla. - Right breast UOQ biopsy (03/19/2022): Benign  breast with fibrocystic changes including stromal fibrosis, adenosis, usual ductal hyperplasia.  Negative for carcinoma. - PET scan (03/22/2022): Bulky hypermetabolic right axillary and subpectoral lymphadenopathy.  No other definite new sites of metastatic disease.  6 mm right lower lobe lung nodule with no FDG uptake. - Neoadjuvant chemotherapy with weekly paclitaxel and pembrolizumab from 04/02/2022 through 06/21/2022,  AC and pembrolizumab from 07/12/2022 through 09/27/2022 - Right mastectomy and lymph node excision (11/01/2022): YpT0, YPN1A, 2/3 lymph nodes positive for metastatic disease, no ECE.  Surgical margins negative. - Germline mutation testing: Negative - Adjuvant pembrolizumab and Xeloda (CREATE-X) started on 12/05/2022, Xeloda dose reduced to 2 tablets twice daily with cycle 2 due to mucositis and feet hurting with skin peeling.    Plan: 1.  Triple negative right breast cancer: - She is tolerating Xeloda 2 tablets twice daily 2 weeks on/1 week off reasonably well. - She had diarrhea for 4 days last week.  Use Imodium as needed. - She also has left lateral tongue ulcer and lower lip ulcer for the last couple of weeks. - Will give her lidocaine mouthwash. - Reviewed labs today: Normal LFTs and creatinine.  CBC with mild leukopenia and thrombocytopenia stable.  Hemoglobin 8.8.  Ferritin was 599 with percent saturation 39. - She will proceed with Keytruda today.  She started her next cycle of Xeloda 1000 mg twice daily 2 weeks on/1 week off today. - RTC 3 weeks for follow-up.   2.  Hypomagnesemia: - Continue magnesium twice daily.  Magnesium is 1.9.   3.  Mild leukopenia and thrombocytopenia: - Mild leukopenia and thrombocytopenia with macrocytosis from myelosuppression.  ANC is normal.    No orders of the defined types were placed in this encounter.     Marilyn Deeds  Ford,acting as a Neurosurgeon for [name], MD.,have documented all relevant documentation on the behalf of  [name], MD,as directed by  [name], MD while in the presence of [name], MD.  I, [name] MD, have reviewed the above documentation for accuracy and completeness, and I agree with the above.        [name], MD   10/31/20245:32 PM  CHIEF COMPLAINT:   Diagnosis: locally advanced TNBC    Cancer Staging  Malignant neoplasm of overlapping sites of right female breast [facility]) Staging form: Breast, AJCC 8th Edition - Clinical stage from 03/08/2022: Stage IIIC (cT4d, cN2, cM0, G3, ER-, PR-, HER2-) - Signed by Malachy Mood, MD on 03/08/2022    Prior Therapy: 1. carboplatin and paclitaxel, 4 cycles, 04/02/22 - 06/06/22 2. Pembrolizumab, 04/02/22 - 09/27/22 3. Adriamycin and Cytoxan, 4 cycles, 07/12/22 - 09/27/22  Current Therapy: Capecitabine and pembrolizumab   HISTORY OF PRESENT ILLNESS:   Oncology History Overview Note   Cancer Staging  Malignant neoplasm of overlapping sites of right female breast [facility]) Staging form: Breast, AJCC 8th Edition - Clinical stage from 03/08/2022: Stage IIIC (cT2, cN2, cM0, G3, ER-, PR-, HER2-) - Signed by Malachy Mood, MD on 03/08/2022    Malignant neoplasm of overlapping sites of right female breast [facility])  02/20/2022 Mammogram   CLINICAL DATA:  62 year old female recalled from screening mammography 03/01/2021 for right breast calcifications and subsequent benign discordant biopsy of these calcifications in the central posterior right breast December 2022 with excision recommended. The patient initially followed up with surgery in April however canceled her scheduled surgery and most recently followed up with Dr. Henreitta Leber September 2023 with diagnostic imaging, possible RF tag placement and subsequent excision recommended.   EXAM: DIGITAL DIAGNOSTIC BILATERAL MAMMOGRAM WITH TOMOSYNTHESIS; ULTRASOUND RIGHT BREAST LIMITED  MPRESSION: 1.  Suspicious right axillary lymphadenopathy.   2. Indeterminate  intramammary lymph node in the right breast at 9 o'clock.   3. 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   4. Decreased calcifications noted at prior benign biopsy site in the lower central posterior right breast at site of X shaped biopsy marking clip.   02/20/2022 Initial Biopsy   FINAL MICROSCOPIC DIAGNOSIS:   A. AXILLA, RIGHT, LYMPH NODE, NEEDLE CORE BIOPSY:  - Positive for carcinoma (see Comment)   B. LYMPH NODE, RIGHT BREAST, BIOPSY:  - Negative for carcinoma   COMMENT:  Part A: Morphology and immunohistochemical staining are most compatible with primary breast carcinoma with metaplastic changes, however differential diagnosis also includes urothelial carcinoma and less likely primary lung carcinoma (squamous).  No lymphoid tissue is identified.  Clinical and radiologic correlation is suggested.   ADDENDUM:  In case of a breast origin, the appropriate grade would be grade 3  (3+3+2)   ADDENDUM:  PROGNOSTIC INDICATOR RESULTS:  The tumor cells are EQUIVOCAL for Her2 (2+).  Estrogen Receptor:       0%, NEGATIVE  Progesterone Receptor:   0%, NEGATIVE  Proliferation Marker Ki-67:   60%   ADDENDUM:  FLOURESCENCE IN-SITU HYBRIDIZATION RESULTS:  GROUP 5:   HER2 **NEGATIVE**    03/02/2022 Imaging   EXAM: BILATERAL BREAST MRI WITH AND WITHOUT CONTRAST  IMPRESSION: 1. There is a suspicious 3.8 cm area of patchy non mass enhancement in a linear orientation in the slightly upper outer right breast in the mid to posterior depth spanning 3.8 cm.  2. Diffuse skin thickening with enhancement of the skin, concerning for inflammatory breast cancer.   3. Numerous bulky matted lymph nodes in the right axilla, one of which corresponds with the biopsy-proven metastatic lymph node.   4.  No evidence of left breast malignancy.    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)   03/08/2022 Cancer Staging   Staging form: Breast, AJCC 8th Edition - Clinical stage from 03/08/2022: Stage IIIC (cT4d, cN2, cM0, G3, ER-, PR-, HER2-) - Signed by Malachy Mood, MD on 03/08/2022 Histologic grading system: 3 grade system   03/19/2022 Pathology Results   Diagnosis Breast, right, needle core biopsy, upper outer quadrant, barbell clip BENIGN BREAST WITH FIBROCYSTIC CHANGES INCLUDING STROMAL FIBROSIS, ADENOSIS AND USUAL DUCT HYPERPLASIA BENIGN FIBROMATOID CHANGE NEGATIVE FOR MICROCALCIFICATIONS NEGATIVE FOR CARCINOMA   03/22/2022 PET scan   IMPRESSION: Bulky hypermetabolic right axillary and subpectoral lymphadenopathy, consistent with metastatic disease.   No other definite sites of metastatic disease identified.   6 mm right lower lobe pulmonary nodule shows no FDG uptake, but is too small to definitively characterize by PET. Recommend continued follow-up by chest CT in 3-4 months.   Aortic Atherosclerosis (ICD10-I70.0).   04/02/2022 - 09/28/2022 Chemotherapy   Patient is on Treatment Plan : BREAST Pembrolizumab (200) D1 + Carboplatin (5) D1 + Paclitaxel (80) D1,8,15 q21d X 4 cycles / Pembrolizumab (200) D1 + AC D1 q21d x 4 cycles      Genetic Testing   Negative genetic testing. No pathogenic variants identified on the [facility] Common Hereditary Cancers+RNA panel. The report date is 05/31/2022.  The Common Hereditary Cancers Panel + RNA offered by [facility] includes sequencing and/or deletion duplication testing of the following 48 genes: APC*, ATM*, AXIN2, BAP1, BARD1, BMPR1A, BRCA1, BRCA2, BRIP1, CDH1, CDK4, CDKN2A (p14ARF), CDKN2A (p16INK4a), CHEK2, CTNNA1, DICER1*, EPCAM*, FH*, GREM1*, HOXB13, KIT, MBD4, MEN1*, MLH1*, MSH2*, MSH3*, MSH6*, MUTYH, NF1*, NTHL1, PALB2, PDGFRA, PMS2*, POLD1*, POLE, PTEN*, RAD51C, RAD51D, SDHA*, SDHB, SDHC*, SDHD, SMAD4, SMARCA4, STK11, TP53, TSC1*,  TSC2, VHL.    12/12/2022 -  Chemotherapy   Patient is on Treatment Plan : BREAST Pembrolizumab (200) q21d x 27 weeks        INTERVAL HISTORY:   Marilyn Ford is a 62 y.o. female presenting to clinic today for follow up of locally advanced TNBC. She was last seen by me on 02/14/23.   Today, she states that she is doing well overall. Her appetite level is at 30%. Her energy level is at 25%. She is accompanied by her husband.   She c/o mouth sores, which have improved since being off of Xeloda. While on Xeloda, mouth sores were severe and spread to the outer lips. She attempted to use alcohol based mouth washes, though quickly stopped due to pain.   She reports diarrhea last week for 4 days, while taking Xeloda. She also felt as if she was dehydrated  with associated headaches. She denies any neuropathy in the feet and pain in the feet. She notes dryness on the top of her feet. She is taking Magnesium as prescribed. She has been drinking Gatorade.   PAST MEDICAL HISTORY:   Past Medical History: Past Medical History:  Diagnosis Date   Allergy    Anxiety    Breast cancer (HCC)    Diabetes mellitus without complication (HCC)    GERD (gastroesophageal reflux disease)    Heart murmur    as a child   History of kidney stones  Hypertension    Pneumonia    Stroke [facility])    Vaginal Pap smear, abnormal     Surgical History: Past Surgical History:  Procedure Laterality Date   BREAST BIOPSY  10/31/2022   [place] RT RADIOACTIVE SEED LOC 10/31/2022 [facility] MAMMOGRAPHY   CESAREAN SECTION     CHOLECYSTECTOMY     COLONOSCOPY WITH PROPOFOL N/A 07/19/2020   Procedure: COLONOSCOPY WITH PROPOFOL;  Surgeon: Dolores Frame, MD;  Location: [facility];  Service: Gastroenterology;  Laterality: N/A;  AM   POLYPECTOMY  07/19/2020   Procedure: POLYPECTOMY;  Surgeon: Marguerita Merles, Reuel Boom, MD;  Location: [facility];  Service: Gastroenterology;;   PORTACATH PLACEMENT N/A 03/20/2022   Procedure:  INSERTION PORT-A-CATH;  Surgeon: Abigail Miyamoto, MD;  Location: [facility];  Service: General;  Laterality: N/A;   RADIOACTIVE SEED GUIDED AXILLARY SENTINEL LYMPH NODE Right 11/01/2022   Procedure: RADIOACTIVE SEED GUIDED RIGHT AXILLARY SENTINEL LYMPH NODE DISSECTION;  Surgeon: Abigail Miyamoto, MD;  Location: [facility];  Service: General;  Laterality: Right;   SIMPLE MASTECTOMY WITH AXILLARY SENTINEL NODE BIOPSY Right 11/01/2022   Procedure: RIGHT SIMPLE MASTECTOMY;  Surgeon: Abigail Miyamoto, MD;  Location: [facility];  Service: General;  Laterality: Right;    Social History: Social History   Socioeconomic History   Marital status: Married    Spouse name: Fayrene Fearing   Number of children: 2   Years of education: 10   Highest education level: 10th grade  Occupational History   Occupation: disabled  Tobacco Use   Smoking status: Former    Current packs/day: 0.00    Average packs/day: 0.5 packs/day for 25.0 years (12.5 ttl pk-yrs)    Types: Cigarettes    Start date: 12/08/1999    Quit date: 03/12/2015    Years since quitting: 7.9   Smokeless tobacco: Never  Vaping Use   Vaping status: Never Used  Substance and Sexual Activity   Alcohol use: Yes    Alcohol/week: 2.0 - 3.0 standard drinks of alcohol    Types: 2 - 3 Glasses of wine per week   Drug use: No   Sexual activity: Not Currently    Birth control/protection: Abstinence, Post-menopausal  Other Topics Concern   Not on file  Social History Narrative   Volunteers at [employer] for a few hours every day.    She really enjoys getting out of of the house and working there.    Social Determinants of Health   Financial Resource Strain: Low Risk  (09/08/2022)   Overall Financial Resource Strain (CARDIA)    Difficulty of Paying Living Expenses: Not very hard  Food Insecurity: No Food Insecurity (12/13/2022)   Hunger Vital Sign    Worried About Running Out of Food in the Last Year: Never true     Ran Out of Food in the Last Year: Never true  Transportation Needs: No Transportation Needs (12/13/2022)   PRAPARE - Administrator, Civil Service (Medical): No    Lack of Transportation (Non-Medical): No  Physical Activity: Inactive (09/08/2022)   Exercise Vital Sign    Days of Exercise per Week: 0 days    Minutes of Exercise per Session: 0 min  Stress: Stress Concern Present (09/08/2022)   [employer] of Occupational Health - Occupational Stress Questionnaire    Feeling of Stress : To some extent  Social Connections: Unknown (09/08/2022)   Social Connection and Isolation Panel [NHANES]    Frequency of Communication with Friends and  Family: More than three times a week    Frequency of Social Gatherings with Friends and Family: Once a week    Attends Religious Services: Patient declined    Database administrator or Organizations: No    Attends Banker Meetings: Never    Marital Status: Married  Catering manager Violence: Not At Risk (12/13/2022)   Humiliation, Afraid, Rape, and Kick questionnaire    Fear of Current or Ex-Partner: No    Emotionally Abused: No    Physically Abused: No    Sexually Abused: No    Family History: Family History  Problem Relation Age of Onset   Diabetes Mother    Uterine cancer Mother 102 - 51   Diabetes Brother    Breast cancer Maternal Aunt        dx >50, d. from cancer   Cancer Maternal Grandmother        unk type, "back cancer?"    Current Medications:  Current Outpatient Medications:    Alcohol Swabs (B-D SINGLE USE SWABS REGULAR) PADS, Test BS daily and as needed Dx E11.9, Disp: 100 each, Rfl: 3   amLODipine (NORVASC) 10 MG tablet, 1 tablet daily, Disp: 90 tablet, Rfl: 3   Apple Cider Vinegar 500 MG TABS, Take 500 mg by mouth in the morning., Disp: , Rfl:    aspirin 81 MG EC tablet, Take 1 tablet (81 mg total) by mouth daily., Disp: 30 tablet, Rfl: 0   atorvastatin (LIPITOR) 80 MG tablet, TAKE 1 TABLET EVERY DAY AT  6PM, Disp: 90 tablet, Rfl: 3   Blood Glucose Calibration (TRUE METRIX LEVEL 1) Low SOLN, Use with glucometer Dx E11.9, Disp: 3 each, Rfl: 0   Blood Glucose Monitoring Suppl (TRUE METRIX AIR GLUCOSE METER) w/Device KIT, Test BS daily and as needed Dx E11.9, Disp: 1 kit, Rfl: 0   Calcium Carb-Cholecalciferol (CALCIUM-VITAMIN D) 600-400 MG-UNIT TABS, Take 1 tablet by mouth in the morning., Disp: , Rfl:    capecitabine (XELODA) 500 MG tablet, Take 2 tablets (1,000 mg total) by mouth 2 (two) times daily after a meal. Take for 14 days, then hold for 7 days. Repeat every 21 days., Disp: 56 tablet, Rfl: 5   cetirizine (ZYRTEC) 10 MG tablet, Take 1 tablet (10 mg total) by mouth daily., Disp: 30 tablet, Rfl: 3   Coenzyme Q10 (COQ10) 100 MG CAPS, Take 100 mg by mouth in the morning., Disp: , Rfl:    Cranberry 425 MG CAPS, Take 425 mg by mouth in the morning and at bedtime., Disp: , Rfl:    diphenoxylate-atropine (LOMOTIL) 2.5-0.025 MG tablet, Take 1 tablet by mouth 4 (four) times daily as needed for diarrhea or loose stools., Disp: 30 tablet, Rfl: 0   Flaxseed, Linseed, (FLAXSEED OIL) 1000 MG CAPS, Take 1,000 mg by mouth in the morning., Disp: , Rfl:    gabapentin (NEURONTIN) 300 MG capsule, Take 1 capsule (300 mg total) by mouth 2 (two) times daily., Disp: 60 capsule, Rfl: 0   glucose blood (TRUE METRIX BLOOD GLUCOSE TEST) test strip, Test BS daily and as needed Dx E11.9, Disp: 100 each, Rfl: 3   icosapent Ethyl (VASCEPA) 1 g capsule, Take 2 g by mouth 2 (two) times daily., Disp: , Rfl:    Krill Oil 500 MG CAPS, Take 3 capsules (1,500 mg total) by mouth in the morning and at bedtime., Disp: , Rfl:    LORazepam (ATIVAN) 1 MG tablet, Take 1 tablet (1 mg total) by mouth 2 (  two) times daily as needed for anxiety., Disp: 10 tablet, Rfl: 0   magnesium oxide (MAG-OX) 400 (240 Mg) MG tablet, TAKE 1 TABLET BY MOUTH TWICE A DAY, Disp: 60 tablet, Rfl: 1   meloxicam (MOBIC) 15 MG tablet, TAKE 1 TABLET (15 MG TOTAL) BY  MOUTH DAILY FOR JOINT AND MUSCLE PAIN, Disp: 90 tablet, Rfl: 0   metoprolol (TOPROL-XL) 200 MG 24 hr tablet, TAKE 1 TABLET ONE TIME DAILY, WITH OR IMMEDIATELY FOLLOWING A MEAL, Disp: 90 tablet, Rfl: 3   Multiple Vitamin (MULTIVITAMIN) capsule, Take 1 capsule by mouth in the morning., Disp: , Rfl:    pantoprazole (PROTONIX) 40 MG tablet, TAKE 1 TABLET twice daily FOR STOMACH, Disp: 180 tablet, Rfl: 3   traZODone (DESYREL) 150 MG tablet, TAKE 1 OR 2 TABLETS AT BEDTIME FOR SLEEP, Disp: 180 tablet, Rfl: 3   TRUEplus Lancets 33G MISC, Test BS daily and as needed Dx E11.9, Disp: 100 each, Rfl: 3   valsartan (DIOVAN) 320 MG tablet, Take 1 tablet (320 mg total) by mouth daily. For blood pressure., Disp: 90 tablet, Rfl: 3   zinc gluconate 50 MG tablet, Take 50 mg by mouth daily., Disp: , Rfl:    Zinc Oxide 20 % PSTE, Apply 1 Application topically 3 (three) times daily., Disp: 57 g, Rfl: 1   aluminum-magnesium hydroxide-simethicone (MAALOX) 200-200-20 MG/5ML SUSP, Take 30 mLs by mouth 4 (four) times daily -  before meals and at bedtime., Disp: 480 mL, Rfl: 2   betamethasone dipropionate 0.05 % cream, Apply topically 2 (two) times daily., Disp: 30 g, Rfl: 0   lidocaine (XYLOCAINE) 2 % solution, Use as directed 15 mLs in the mouth or throat every 6 (six) hours as needed for mouth pain., Disp: 250 mL, Rfl: 1   ondansetron (ZOFRAN) 8 MG tablet, Take 1 tablet (8 mg total) by mouth every 8 (eight) hours as needed for nausea or vomiting. (Patient not taking: Reported on 03/07/2023), Disp: 30 tablet, Rfl: 3 No current facility-administered medications for this visit.  Facility-Administered Medications Ordered in Other Visits:    insulin aspart (novoLOG) injection 10 Units, 10 Units, Subcutaneous, Once, [name], MD   Allergies: Allergies  Allergen Reactions   Penicillins Shortness Of Breath   Sulfa Antibiotics Rash    REVIEW OF SYSTEMS:   Review of Systems  Constitutional:  Negative for chills,  fatigue and fever.  HENT:   Positive for mouth sores. Negative for lump/mass, nosebleeds, sore throat and trouble swallowing.        +Aphthous ulcers on the left lateral tongue and lower lip  Eyes:  Negative for eye problems.  Respiratory:  Negative for cough and shortness of breath.   Cardiovascular:  Negative for chest pain, leg swelling and palpitations.  Gastrointestinal:  Positive for diarrhea. Negative for abdominal pain, constipation, nausea and vomiting.  Genitourinary:  Negative for bladder incontinence, difficulty urinating, dysuria, frequency, hematuria and nocturia.   Musculoskeletal:  Negative for arthralgias, back pain, flank pain, myalgias and neck pain.  Skin:  Negative for itching and rash.  Neurological:  Negative for dizziness, headaches and numbness.  Hematological:  Does not bruise/bleed easily.  Psychiatric/Behavioral:  Negative for depression, sleep disturbance and suicidal ideas. The patient is not nervous/anxious.   All other systems reviewed and are negative.    VITALS:   There were no vitals taken for this visit.  Wt Readings from Last 3 Encounters:  03/07/23 142 lb 4.8 oz (64.5 kg)  02/14/23 143 lb 6.4 oz (65  kg)  01/30/23 139 lb 3.2 oz (63.1 kg)    There is no height or weight on file to calculate BMI.  Performance status (ECOG): 1 - Symptomatic but completely ambulatory  PHYSICAL EXAM:   Physical Exam Vitals and nursing note reviewed. Exam conducted with a chaperone present.  Constitutional:      Appearance: Normal appearance.  Cardiovascular:     Rate and Rhythm: Normal rate and regular rhythm.     Pulses: Normal pulses.     Heart sounds: Normal heart sounds.  Pulmonary:     Effort: Pulmonary effort is normal.     Breath sounds: Normal breath sounds.  Abdominal:     Palpations: Abdomen is soft. There is no hepatomegaly, splenomegaly or mass.     Tenderness: There is no abdominal tenderness.  Musculoskeletal:     Right lower leg: No edema.      Left lower leg: No edema.  Lymphadenopathy:     Cervical: No cervical adenopathy.     Right cervical: No superficial, deep or posterior cervical adenopathy.    Left cervical: No superficial, deep or posterior cervical adenopathy.     Upper Body:     Right upper body: No supraclavicular or axillary adenopathy.     Left upper body: No supraclavicular or axillary adenopathy.  Neurological:     General: No focal deficit present.     Mental Status: She is alert and oriented to person, place, and time.  Psychiatric:        Mood and Affect: Mood normal.        Behavior: Behavior normal.     LABS:      Latest Ref Rng & Units 03/07/2023    9:50 AM 02/14/2023    9:31 AM 01/24/2023   11:55 AM  CBC  WBC 4.0 - 10.5 K/uL 3.7  3.9  3.7   Hemoglobin 12.0 - 15.0 g/dL 8.8  9.1  9.1   Hematocrit 36.0 - 46.0 % 26.0  26.7  26.9   Platelets 150 - 400 K/uL 127  105  115       Latest Ref Rng & Units 03/07/2023    9:50 AM 02/14/2023    9:31 AM 01/24/2023   11:55 AM  CMP  Glucose 70 - 99 mg/dL 409  811  914   BUN 8 - 23 mg/dL 17  24  23    Creatinine 0.44 - 1.00 mg/dL 7.82  9.56  2.13   Sodium 135 - 145 mmol/L 138  136  138   Potassium 3.5 - 5.1 mmol/L 4.0  4.2  4.2   Chloride 98 - 111 mmol/L 101  103  103   CO2 22 - 32 mmol/L 26  27  27    Calcium 8.9 - 10.3 mg/dL 9.3  8.6  8.9   Total Protein 6.5 - 8.1 g/dL 6.8  6.6  6.4   Total Bilirubin 0.3 - 1.2 mg/dL 1.0  1.1  0.9   Alkaline Phos 38 - 126 U/L 101  92  89   AST 15 - 41 U/L 22  21  19    ALT 0 - 44 U/L 20  20  22       No results found for: "CEA1", "CEA" / No results found for: "CEA1", "CEA" No results found for: "PSA1" No results found for: "YQM578" No results found for: "CAN125"  No results found for: "TOTALPROTELP", "ALBUMINELP", "A1GS", "A2GS", "BETS", "BETA2SER", "GAMS", "MSPIKE", "SPEI" Lab Results  Component Value Date   TIBC  350 02/14/2023   FERRITIN 599 (H) 02/14/2023   IRONPCTSAT 39 (H) 02/14/2023   No results found  for: "LDH"   STUDIES:   No results found.

## 2023-03-07 NOTE — Patient Instructions (Signed)

## 2023-03-07 NOTE — Progress Notes (Signed)
Patients port flushed without difficulty.  Good blood return noted with no bruising or swelling noted at site.  Patient remains accessed for treatment.  

## 2023-03-07 NOTE — Telephone Encounter (Signed)
Rec'd patients portion of [employer], for [employer].   Emailed Raynelle Fanning regarding provider [employer].

## 2023-03-12 ENCOUNTER — Other Ambulatory Visit (HOSPITAL_COMMUNITY): Payer: Self-pay

## 2023-03-13 ENCOUNTER — Other Ambulatory Visit: Payer: Self-pay

## 2023-03-13 NOTE — Progress Notes (Signed)
Specialty Pharmacy Refill Coordination Note  Marilyn Ford is a 62 y.o. female contacted today regarding refills of specialty medication(s) Capecitabine   Patient requested Delivery   Delivery date: 03/22/23   Verified address: [address]  [place] [address]   Medication will be filled on 03/21/23.

## 2023-03-20 ENCOUNTER — Encounter: Payer: Self-pay | Admitting: Family Medicine

## 2023-03-20 ENCOUNTER — Ambulatory Visit: Payer: No Typology Code available for payment source | Admitting: Family Medicine

## 2023-03-20 VITALS — BP 114/67 | HR 66 | Temp 97.4°F | Ht 62.0 in | Wt 139.2 lb

## 2023-03-20 DIAGNOSIS — I10 Essential (primary) hypertension: Secondary | ICD-10-CM

## 2023-03-20 DIAGNOSIS — F411 Generalized anxiety disorder: Secondary | ICD-10-CM | POA: Diagnosis not present

## 2023-03-20 DIAGNOSIS — E782 Mixed hyperlipidemia: Secondary | ICD-10-CM | POA: Diagnosis not present

## 2023-03-20 DIAGNOSIS — F430 Acute stress reaction: Secondary | ICD-10-CM | POA: Diagnosis not present

## 2023-03-20 DIAGNOSIS — E118 Type 2 diabetes mellitus with unspecified complications: Secondary | ICD-10-CM | POA: Diagnosis not present

## 2023-03-20 LAB — BAYER DCA HB A1C WAIVED: HB A1C (BAYER DCA - WAIVED): 5 % (ref 4.8–5.6)

## 2023-03-20 MED ORDER — ESCITALOPRAM OXALATE 10 MG PO TABS
10.0000 mg | ORAL_TABLET | Freq: Every day | ORAL | 1 refills | Status: DC
Start: 2023-03-20 — End: 2023-08-02

## 2023-03-20 NOTE — Progress Notes (Signed)
Subjective:  Patient ID: Marilyn Ford,  female    DOB: [birth year]  Age: 62 y.o.    CC: Medical Management of Chronic Issues   HPI Marilyn Ford presents for  follow-up of hypertension. Patient has no history of headache chest pain or shortness of breath or recent cough. Patient also denies symptoms of TIA such as numbness weakness lateralizing. Patient denies side effects from medication. States taking it regularly.  Patient also  in for follow-up of elevated cholesterol. Doing well without complaints on current medication. Denies side effects  including myalgia and arthralgia and nausea. Also in today for liver function testing. Currently no chest pain, shortness of breath or other cardiovascular related symptoms noted.  Follow-up of diabetes. Patient does check blood sugar at home. Patient denies symptoms such as excessive hunger or urinary frequency, excessive hunger, nausea No significant hypoglycemic spells noted.  Going through cancer chemo for right breast. To start radiation after 2 more rounds of chemo. A1c has gone way down and Marilyn Ford is off of Marilyn Ford DM meds.   History Marilyn Ford has a past medical history of Allergy, Anxiety, Breast cancer (HCC), Diabetes mellitus without complication (HCC), GERD (gastroesophageal reflux disease), Heart murmur, History of kidney stones, Hypertension, Pneumonia, Stroke (HCC), and Vaginal Pap smear, abnormal.   Marilyn Ford has a past surgical history that includes Cholecystectomy; Cesarean section; Colonoscopy with propofol (N/A, 07/19/2020); polypectomy (07/19/2020); Portacath placement (N/A, 03/20/2022); Breast biopsy (10/31/2022); Simple mastectomy with axillary sentinel node biopsy (Right, 11/01/2022); and Radioactive seed guided axillary sentinel lymph node (Right, 11/01/2022).   Marilyn Ford family history includes Breast cancer in Marilyn Ford maternal aunt; Cancer in Marilyn Ford maternal grandmother; Diabetes in Marilyn Ford brother and mother; Uterine cancer (age of onset: 47 - 4) in Marilyn Ford  mother.Marilyn Ford reports that Marilyn Ford quit smoking about 8 years ago. Marilyn Ford smoking use included cigarettes. Marilyn Ford started smoking about 23 years ago. Marilyn Ford has a 12.5 pack-year smoking history. Marilyn Ford has never used smokeless tobacco. Marilyn Ford reports current alcohol use of about 2.0 - 3.0 standard drinks of alcohol per week. Marilyn Ford reports that Marilyn Ford does not use drugs.  Current Outpatient Medications on File Prior to Visit  Medication Sig Dispense Refill   Alcohol Swabs (B-D SINGLE USE SWABS REGULAR) PADS Test BS daily and as needed Dx E11.9 100 each 3   aluminum-magnesium hydroxide-simethicone (MAALOX) 200-200-20 MG/5ML SUSP Take 30 mLs by mouth 4 (four) times daily -  before meals and at bedtime. 480 mL 2   amLODipine (NORVASC) 10 MG tablet 1 tablet daily 90 tablet 3   Apple Cider Vinegar 500 MG TABS Take 500 mg by mouth in the morning.     aspirin 81 MG EC tablet Take 1 tablet (81 mg total) by mouth daily. 30 tablet 0   atorvastatin (LIPITOR) 80 MG tablet TAKE 1 TABLET EVERY DAY AT 6PM 90 tablet 3   betamethasone dipropionate 0.05 % cream Apply topically 2 (two) times daily. 30 g 0   Blood Glucose Calibration (TRUE METRIX LEVEL 1) Low SOLN Use with glucometer Dx E11.9 3 each 0   Blood Glucose Monitoring Suppl (TRUE METRIX AIR GLUCOSE METER) w/Device KIT Test BS daily and as needed Dx E11.9 1 kit 0   Calcium Carb-Cholecalciferol (CALCIUM-VITAMIN D) 600-400 MG-UNIT TABS Take 1 tablet by mouth in the morning.     capecitabine (XELODA) 500 MG tablet Take 2 tablets (1,000 mg total) by mouth 2 (two) times daily after a meal. Take for 14 days, then hold for 7 days. Repeat every  21 days. 56 tablet 5   cetirizine (ZYRTEC) 10 MG tablet Take 1 tablet (10 mg total) by mouth daily. 30 tablet 3   Coenzyme Q10 (COQ10) 100 MG CAPS Take 100 mg by mouth in the morning.     Cranberry 425 MG CAPS Take 425 mg by mouth in the morning and at bedtime.     diphenoxylate-atropine (LOMOTIL) 2.5-0.025 MG tablet Take 1 tablet by mouth 4 (four) times  daily as needed for diarrhea or loose stools. 30 tablet 0   Flaxseed, Linseed, (FLAXSEED OIL) 1000 MG CAPS Take 1,000 mg by mouth in the morning.     gabapentin (NEURONTIN) 300 MG capsule Take 1 capsule (300 mg total) by mouth 2 (two) times daily. 60 capsule 0   glucose blood (TRUE METRIX BLOOD GLUCOSE TEST) test strip Test BS daily and as needed Dx E11.9 100 each 3   icosapent Ethyl (VASCEPA) 1 g capsule Take 2 g by mouth 2 (two) times daily.     Krill Oil 500 MG CAPS Take 3 capsules (1,500 mg total) by mouth in the morning and at bedtime.     lidocaine (XYLOCAINE) 2 % solution Use as directed 15 mLs in the mouth or throat every 6 (six) hours as needed for mouth pain. 250 mL 1   LORazepam (ATIVAN) 1 MG tablet Take 1 tablet (1 mg total) by mouth 2 (two) times daily as needed for anxiety. 10 tablet 0   magnesium oxide (MAG-OX) 400 (240 Mg) MG tablet TAKE 1 TABLET BY MOUTH TWICE A DAY 60 tablet 1   meloxicam (MOBIC) 15 MG tablet TAKE 1 TABLET (15 MG TOTAL) BY MOUTH DAILY FOR JOINT AND MUSCLE PAIN 90 tablet 0   metoprolol (TOPROL-XL) 200 MG 24 hr tablet TAKE 1 TABLET ONE TIME DAILY, WITH OR IMMEDIATELY FOLLOWING A MEAL 90 tablet 3   Multiple Vitamin (MULTIVITAMIN) capsule Take 1 capsule by mouth in the morning.     ondansetron (ZOFRAN) 8 MG tablet Take 1 tablet (8 mg total) by mouth every 8 (eight) hours as needed for nausea or vomiting. 30 tablet 3   pantoprazole (PROTONIX) 40 MG tablet TAKE 1 TABLET twice daily FOR STOMACH 180 tablet 3   traZODone (DESYREL) 150 MG tablet TAKE 1 OR 2 TABLETS AT BEDTIME FOR SLEEP 180 tablet 3   TRUEplus Lancets 33G MISC Test BS daily and as needed Dx E11.9 100 each 3   valsartan (DIOVAN) 320 MG tablet Take 1 tablet (320 mg total) by mouth daily. For blood pressure. 90 tablet 3   zinc gluconate 50 MG tablet Take 50 mg by mouth daily.     Zinc Oxide 20 % PSTE Apply 1 Application topically 3 (three) times daily. 57 g 1   No current facility-administered medications on  file prior to visit.    ROS Review of Systems  Constitutional: Negative.   HENT: Negative.    Eyes:  Negative for visual disturbance.  Respiratory:  Negative for shortness of breath.   Cardiovascular:  Negative for chest pain.  Gastrointestinal:  Negative for abdominal pain.  Musculoskeletal:  Negative for arthralgias.  Psychiatric/Behavioral:  The patient is nervous/anxious (stressed by mother's dementia.  Asks for med.).     Objective:  BP 114/67   Pulse 66   Temp (!) 97.4 F (36.3 C)   Ht 5\' 2"  (1.575 m)   Wt 139 lb 3.2 oz (63.1 kg)   SpO2 100%   BMI 25.46 kg/m   BP Readings from Last 3  Encounters:  03/20/23 114/67  03/07/23 (!) 124/57  03/07/23 133/61    Wt Readings from Last 3 Encounters:  03/20/23 139 lb 3.2 oz (63.1 kg)  03/07/23 142 lb 4.8 oz (64.5 kg)  02/14/23 143 lb 6.4 oz (65 kg)     Physical Exam Constitutional:      General: Marilyn Ford is not in acute distress.    Appearance: Marilyn Ford is well-developed.  Cardiovascular:     Rate and Rhythm: Normal rate and regular rhythm.  Pulmonary:     Breath sounds: Normal breath sounds.  Musculoskeletal:        General: Normal range of motion.  Skin:    General: Skin is warm and dry.  Neurological:     Mental Status: Marilyn Ford is alert and oriented to person, place, and time.     Diabetic Foot Exam - Simple   No data filed     Lab Results  Component Value Date   HGBA1C 4.2 (L) 12/17/2022   HGBA1C 4.7 (L) 09/12/2022   HGBA1C 6.3 (H) 06/11/2022    Assessment & Plan:   Marilyn Ford was seen today for medical management of chronic issues.  Diagnoses and all orders for this visit:  Controlled type 2 diabetes mellitus with complication, without long-term current use of insulin (HCC) -     Bayer DCA Hb A1c Waived -     Microalbumin / creatinine urine ratio  Primary hypertension -     CBC with Differential/Platelet -     CMP14+EGFR  Mixed hyperlipidemia -     Lipid panel  Anxiety as acute reaction to exceptional  stress  Other orders -     escitalopram (LEXAPRO) 10 MG tablet; Take 1 tablet (10 mg total) by mouth daily.   I am having Marilyn Ford. Marilyn Ford start on escitalopram. I am also having Marilyn Ford maintain Marilyn Ford multivitamin, aspirin EC, Flaxseed Oil, Apple Cider Vinegar, Calcium-Vitamin D, zinc gluconate, CoQ10, Cranberry, Krill Oil, True Metrix Air Glucose Meter, True Metrix Blood Glucose Test, TRUEplus Lancets 33G, True Metrix Level 1, B-D SINGLE USE SWABS REGULAR, icosapent Ethyl, amLODipine, atorvastatin, metoprolol, traZODone, magnesium oxide, ondansetron, LORazepam, pantoprazole, diphenoxylate-atropine, Zinc Oxide, valsartan, capecitabine, gabapentin, cetirizine, meloxicam, aluminum-magnesium hydroxide-simethicone, lidocaine, and betamethasone dipropionate.  Meds ordered this encounter  Medications   escitalopram (LEXAPRO) 10 MG tablet    Sig: Take 1 tablet (10 mg total) by mouth daily.    Dispense:  90 tablet    Refill:  1     Follow-up: Return in about 3 months (around 06/20/2023).  Mechele Claude, M.D.

## 2023-03-21 ENCOUNTER — Encounter: Payer: Self-pay | Admitting: Hematology

## 2023-03-21 LAB — CMP14+EGFR
ALT: 21 IU/L (ref 0–32)
AST: 24 IU/L (ref 0–40)
Albumin: 4.4 g/dL (ref 3.9–4.9)
Alkaline Phosphatase: 123 IU/L — ABNORMAL HIGH (ref 44–121)
BUN/Creatinine Ratio: 20 (ref 12–28)
BUN: 20 mg/dL (ref 8–27)
Bilirubin Total: 1 mg/dL (ref 0.0–1.2)
CO2: 25 mmol/L (ref 20–29)
Calcium: 9.7 mg/dL (ref 8.7–10.3)
Chloride: 103 mmol/L (ref 96–106)
Creatinine, Ser: 1 mg/dL (ref 0.57–1.00)
Globulin, Total: 2.4 g/dL (ref 1.5–4.5)
Glucose: 124 mg/dL — ABNORMAL HIGH (ref 70–99)
Potassium: 4.5 mmol/L (ref 3.5–5.2)
Sodium: 143 mmol/L (ref 134–144)
Total Protein: 6.8 g/dL (ref 6.0–8.5)
eGFR: 64 mL/min/{1.73_m2} (ref >59)

## 2023-03-21 LAB — CBC WITH DIFFERENTIAL/PLATELET
Basophils Absolute: 0 10*3/uL (ref 0.0–0.2)
Basos: 1 %
EOS (ABSOLUTE): 0.1 10*3/uL (ref 0.0–0.4)
Eos: 2 %
Hematocrit: 26 % — ABNORMAL LOW (ref 34.0–46.6)
Hemoglobin: 8.8 g/dL — CL (ref 11.1–15.9)
Immature Grans (Abs): 0 10*3/uL (ref 0.0–0.1)
Immature Granulocytes: 0 %
Lymphocytes Absolute: 0.6 10*3/uL — ABNORMAL LOW (ref 0.7–3.1)
Lymphs: 16 %
MCH: 39.1 pg — ABNORMAL HIGH (ref 26.6–33.0)
MCHC: 33.8 g/dL (ref 31.5–35.7)
MCV: 116 fL — ABNORMAL HIGH (ref 79–97)
Monocytes Absolute: 0.3 10*3/uL (ref 0.1–0.9)
Monocytes: 7 %
Neutrophils Absolute: 3 10*3/uL (ref 1.4–7.0)
Neutrophils: 74 %
Platelets: 163 10*3/uL (ref 150–450)
RBC: 2.25 x10E6/uL — CL (ref 3.77–5.28)
RDW: 15.7 % — ABNORMAL HIGH (ref 11.7–15.4)
WBC: 4 10*3/uL (ref 3.4–10.8)

## 2023-03-21 LAB — LIPID PANEL
Chol/HDL Ratio: 3.1 ratio (ref 0.0–4.4)
Cholesterol, Total: 110 mg/dL (ref 100–199)
HDL: 36 mg/dL — ABNORMAL LOW (ref >39)
LDL Chol Calc (NIH): 44 mg/dL (ref 0–99)
Triglycerides: 186 mg/dL — ABNORMAL HIGH (ref 0–149)
VLDL Cholesterol Cal: 30 mg/dL (ref 5–40)

## 2023-03-21 LAB — MICROALBUMIN / CREATININE URINE RATIO
Creatinine, Urine: 93.1 mg/dL
Microalb/Creat Ratio: 7 mg/g{creat} (ref 0–29)
Microalbumin, Urine: 6.5 ug/mL

## 2023-03-28 ENCOUNTER — Inpatient Hospital Stay: Payer: No Typology Code available for payment source

## 2023-03-28 ENCOUNTER — Inpatient Hospital Stay (HOSPITAL_BASED_OUTPATIENT_CLINIC_OR_DEPARTMENT_OTHER): Payer: No Typology Code available for payment source | Admitting: Oncology

## 2023-03-28 ENCOUNTER — Inpatient Hospital Stay: Payer: No Typology Code available for payment source | Attending: Hematology and Oncology

## 2023-03-28 ENCOUNTER — Ambulatory Visit: Payer: No Typology Code available for payment source | Admitting: Hematology

## 2023-03-28 VITALS — BP 103/56 | HR 59 | Temp 97.7°F | Resp 17

## 2023-03-28 DIAGNOSIS — Z171 Estrogen receptor negative status [ER-]: Secondary | ICD-10-CM

## 2023-03-28 DIAGNOSIS — Z5112 Encounter for antineoplastic immunotherapy: Secondary | ICD-10-CM | POA: Insufficient documentation

## 2023-03-28 DIAGNOSIS — Z79620 Long term (current) use of immunosuppressive biologic: Secondary | ICD-10-CM | POA: Diagnosis not present

## 2023-03-28 DIAGNOSIS — C50811 Malignant neoplasm of overlapping sites of right female breast: Secondary | ICD-10-CM

## 2023-03-28 DIAGNOSIS — Z79899 Other long term (current) drug therapy: Secondary | ICD-10-CM | POA: Diagnosis not present

## 2023-03-28 LAB — CBC WITH DIFFERENTIAL/PLATELET
Abs Immature Granulocytes: 0.01 10*3/uL (ref 0.00–0.07)
Basophils Absolute: 0 10*3/uL (ref 0.0–0.1)
Basophils Relative: 1 %
Eosinophils Absolute: 0.1 10*3/uL (ref 0.0–0.5)
Eosinophils Relative: 5 %
HCT: 26.2 % — ABNORMAL LOW (ref 36.0–46.0)
Hemoglobin: 8.7 g/dL — ABNORMAL LOW (ref 12.0–15.0)
Immature Granulocytes: 0 %
Lymphocytes Relative: 16 %
Lymphs Abs: 0.5 10*3/uL — ABNORMAL LOW (ref 0.7–4.0)
MCH: 38.3 pg — ABNORMAL HIGH (ref 26.0–34.0)
MCHC: 33.2 g/dL (ref 30.0–36.0)
MCV: 115.4 fL — ABNORMAL HIGH (ref 80.0–100.0)
Monocytes Absolute: 0.3 10*3/uL (ref 0.1–1.0)
Monocytes Relative: 8 %
Neutro Abs: 2.2 10*3/uL (ref 1.7–7.7)
Neutrophils Relative %: 70 %
Platelets: 123 10*3/uL — ABNORMAL LOW (ref 150–400)
RBC: 2.27 MIL/uL — ABNORMAL LOW (ref 3.87–5.11)
RDW: 17.3 % — ABNORMAL HIGH (ref 11.5–15.5)
WBC: 3.1 10*3/uL — ABNORMAL LOW (ref 4.0–10.5)
nRBC: 0 % (ref 0.0–0.2)

## 2023-03-28 LAB — SAMPLE TO BLOOD BANK

## 2023-03-28 LAB — COMPREHENSIVE METABOLIC PANEL
ALT: 22 U/L (ref 0–44)
AST: 21 U/L (ref 15–41)
Albumin: 4 g/dL (ref 3.5–5.0)
Alkaline Phosphatase: 98 U/L (ref 38–126)
Anion gap: 7 (ref 5–15)
BUN: 18 mg/dL (ref 8–23)
CO2: 29 mmol/L (ref 22–32)
Calcium: 9.2 mg/dL (ref 8.9–10.3)
Chloride: 103 mmol/L (ref 98–111)
Creatinine, Ser: 0.96 mg/dL (ref 0.44–1.00)
GFR, Estimated: >60 mL/min (ref >60)
Glucose, Bld: 147 mg/dL — ABNORMAL HIGH (ref 70–99)
Potassium: 4.2 mmol/L (ref 3.5–5.1)
Sodium: 139 mmol/L (ref 135–145)
Total Bilirubin: 1.3 mg/dL — ABNORMAL HIGH (ref <1.2)
Total Protein: 6.7 g/dL (ref 6.5–8.1)

## 2023-03-28 LAB — TSH: TSH: 2.685 u[IU]/mL (ref 0.350–4.500)

## 2023-03-28 LAB — MAGNESIUM: Magnesium: 2.3 mg/dL (ref 1.7–2.4)

## 2023-03-28 MED ORDER — SODIUM CHLORIDE 0.9% FLUSH
10.0000 mL | INTRAVENOUS | Status: DC | PRN
  Administered 2023-03-28: 10 mL

## 2023-03-28 MED ORDER — HEPARIN SOD (PORK) LOCK FLUSH 100 UNIT/ML IV SOLN
500.0000 [IU] | Freq: Once | INTRAVENOUS | Status: AC | PRN
  Administered 2023-03-28: 500 [IU]

## 2023-03-28 MED ORDER — SODIUM CHLORIDE 0.9 % IV SOLN: Freq: Once | INTRAVENOUS | Status: AC

## 2023-03-28 MED ORDER — SODIUM CHLORIDE 0.9 % IV SOLN
200.0000 mg | Freq: Once | INTRAVENOUS | Status: AC
  Administered 2023-03-28: 200 mg via INTRAVENOUS
  Filled 2023-03-28: qty 8

## 2023-03-28 NOTE — Progress Notes (Signed)
[facility] [address] [place] [address]    Clinic Day:  03/28/23    Referring physician: Mechele Claude, MD  Patient Care Team: Mechele Claude, MD as PCP - General (Family Medicine) Mechele Claude, MD (Family Medicine) Danella Maiers, [facility] as Pharmacist (Family Medicine) Marguerita Merles, Reuel Boom, MD as Consulting Physician (Gastroenterology) Franky Macho, MD as Consulting Physician (General Surgery) Pershing Proud, RN as Oncology Nurse Navigator [name], RN as Oncology Nurse Navigator [name], MD as Medical Oncologist (Medical Oncology) Therese Sarah, RN as Oncology Nurse Navigator (Medical Oncology) [name], MD as Consulting Physician (Hematology) Mechele Claude, MD as Referring Physician (Family Medicine)   ASSESSMENT & PLAN:   Assessment: 1.  Inflammatory right breast cancer: - Bilateral diagnostic mammogram (02/20/2022): Suspicious right axillary lymphadenopathy.  Indeterminate intramammary lymph node in the right breast at 9:00.  New right breast skin and trabecular thickening, related to vascular congestion from enlarged lymph nodes in the right axilla. - Right axillary lymph node core biopsy (02/20/2022): Morphology and IHC compatible with primary breast cancer with differential diagnosis of urothelial carcinoma and primary lung carcinoma (squamous).  Grade 3.  ER 0%, PR 0%, HER2 2+, Ki-67 60%, HER2 negative by FISH. - Right breast lymph node biopsy at 9:00 (02/20/2022): Negative for carcinoma. - MRI breast (03/02/2022): In the upper outer right breast, mid to posterior depth there is a patchy clumped non-mass enhancement in a linear orientation spanning approximately 3.8 cm.  There is diffuse thickening of the skin in the right breast with skin enhancement.  Left breast with no mass or abnormal enhancement.  Numerous bulky matted lymph nodes in the right axilla. - Right breast UOQ biopsy (03/19/2022): Benign  breast with fibrocystic changes including stromal fibrosis, adenosis, usual ductal hyperplasia.  Negative for carcinoma. - PET scan (03/22/2022): Bulky hypermetabolic right axillary and subpectoral lymphadenopathy.  No other definite new sites of metastatic disease.  6 mm right lower lobe lung nodule with no FDG uptake. - Neoadjuvant chemotherapy with weekly paclitaxel and pembrolizumab from 04/02/2022 through 06/21/2022,  AC and pembrolizumab from 07/12/2022 through 09/27/2022 - Right mastectomy and lymph node excision (11/01/2022): YpT0, YPN1A, 2/3 lymph nodes positive for metastatic disease, no ECE.  Surgical margins negative. - Germline mutation testing: Negative - Adjuvant pembrolizumab and Xeloda (CREATE-X) started on 12/05/2022, Xeloda dose reduced to 2 tablets twice daily with cycle 2 due to mucositis and feet hurting with skin peeling.    Plan: 1.  Triple negative right breast cancer: - She is tolerating Xeloda 2 tablets twice daily 2 weeks on/1 week off reasonably well. -Reports diarrhea has improved with each cycle and during her last cycle she did not have any. -She is starting her next cycle of Xeloda today.  Reports she will take it after she eats something. -Denies any additional mouth concerns or ulcers.  Has not had to use her Magic mouthwash in several weeks. - Reviewed labs from 03/20/2023 with essentially unremarkable CMP.  Alk phos elevated at 123.  Liver and kidney function WNL.  Hemoglobin 8.8 with MCV 116.  Hematocrit 26.0.  White count is normal.  Differential is normal.  TSH WNL. -Proceed with Keytruda today.  She will start her next cycle of Xeloda 1000 mg twice daily 2 weeks on/1 week off today as well. - RTC 3 weeks for follow-up.   2.  Hypomagnesemia: - Continue magnesium twice daily.  Magnesium is 2.3.   3.  Mild leukopenia and thrombocytopenia: -  Mild leukopenia and thrombocytopenia with macrocytosis from myelosuppression.  ANC is normal.   PLAN SUMMARY: >> Proceed  with Keytruda today. >> Start Xeloda today 1000 mg twice daily 2 weeks on/1 week off. >> RTC in 3 weeks as scheduled.     No orders of the defined types were placed in this encounter.    Mauro Kaufmann, NP   11/21/20248:01 AM  CHIEF COMPLAINT:   Diagnosis: locally advanced TNBC    Cancer Staging  Malignant neoplasm of overlapping sites of right female breast [facility]) Staging form: Breast, AJCC 8th Edition - Clinical stage from 03/08/2022: Stage IIIC (cT4d, cN2, cM0, G3, ER-, PR-, HER2-) - Signed by Malachy Mood, MD on 03/08/2022    Prior Therapy: 1. carboplatin and paclitaxel, 4 cycles, 04/02/22 - 06/06/22 2. Pembrolizumab, 04/02/22 - 09/27/22 3. Adriamycin and Cytoxan, 4 cycles, 07/12/22 - 09/27/22  Current Therapy: Capecitabine and pembrolizumab   HISTORY OF PRESENT ILLNESS:   Oncology History Overview Note   Cancer Staging  Malignant neoplasm of overlapping sites of right female breast [facility]) Staging form: Breast, AJCC 8th Edition - Clinical stage from 03/08/2022: Stage IIIC (cT2, cN2, cM0, G3, ER-, PR-, HER2-) - Signed by Malachy Mood, MD on 03/08/2022    Malignant neoplasm of overlapping sites of right female breast [facility])  02/20/2022 Mammogram   CLINICAL DATA:  62 year old female recalled from screening mammography 03/01/2021 for right breast calcifications and subsequent benign discordant biopsy of these calcifications in the central posterior right breast December 2022 with excision recommended. The patient initially followed up with surgery in April however canceled her scheduled surgery and most recently followed up with Dr. Henreitta Leber September 2023 with diagnostic imaging, possible RF tag placement and subsequent excision recommended.   EXAM: DIGITAL DIAGNOSTIC BILATERAL MAMMOGRAM WITH TOMOSYNTHESIS; ULTRASOUND RIGHT BREAST LIMITED  MPRESSION: 1.  Suspicious right axillary lymphadenopathy.   2. Indeterminate intramammary lymph node in the right breast at 9 o'clock.    3. 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   4. Decreased calcifications noted at prior benign biopsy site in the lower central posterior right breast at site of X shaped biopsy marking clip.   02/20/2022 Initial Biopsy   FINAL MICROSCOPIC DIAGNOSIS:   A. AXILLA, RIGHT, LYMPH NODE, NEEDLE CORE BIOPSY:  - Positive for carcinoma (see Comment)   B. LYMPH NODE, RIGHT BREAST, BIOPSY:  - Negative for carcinoma   COMMENT:  Part A: Morphology and immunohistochemical staining are most compatible with primary breast carcinoma with metaplastic changes, however differential diagnosis also includes urothelial carcinoma and less likely primary lung carcinoma (squamous).  No lymphoid tissue is identified.  Clinical and radiologic correlation is suggested.   ADDENDUM:  In case of a breast origin, the appropriate grade would be grade 3  (3+3+2)   ADDENDUM:  PROGNOSTIC INDICATOR RESULTS:  The tumor cells are EQUIVOCAL for Her2 (2+).  Estrogen Receptor:       0%, NEGATIVE  Progesterone Receptor:   0%, NEGATIVE  Proliferation Marker Ki-67:   60%   ADDENDUM:  FLOURESCENCE IN-SITU HYBRIDIZATION RESULTS:  GROUP 5:   HER2 **NEGATIVE**    03/02/2022 Imaging   EXAM: BILATERAL BREAST MRI WITH AND WITHOUT CONTRAST  IMPRESSION: 1. There is a suspicious 3.8 cm area of patchy non mass enhancement in a linear orientation in the slightly upper outer right breast in the mid to posterior depth spanning 3.8 cm.  2. Diffuse skin thickening with enhancement of the skin, concerning for inflammatory breast cancer.   3. Numerous bulky matted lymph nodes in the right axilla, one of which corresponds with the biopsy-proven metastatic lymph node.   4.  No evidence of left breast malignancy.   03/05/2022 Initial Diagnosis   Malignant neoplasm of overlapping  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)   03/08/2022 Cancer Staging   Staging form: Breast, AJCC 8th Edition - Clinical stage from 03/08/2022: Stage IIIC (cT4d, cN2, cM0, G3, ER-, PR-, HER2-) - Signed by Malachy Mood, MD on 03/08/2022 Histologic grading system: 3 grade system   03/19/2022 Pathology Results   Diagnosis Breast, right, needle core biopsy, upper outer quadrant, barbell clip BENIGN BREAST WITH FIBROCYSTIC CHANGES INCLUDING STROMAL FIBROSIS, ADENOSIS AND USUAL DUCT HYPERPLASIA BENIGN FIBROMATOID CHANGE NEGATIVE FOR MICROCALCIFICATIONS NEGATIVE FOR CARCINOMA   03/22/2022 PET scan   IMPRESSION: Bulky hypermetabolic right axillary and subpectoral lymphadenopathy, consistent with metastatic disease.   No other definite sites of metastatic disease identified.   6 mm right lower lobe pulmonary nodule shows no FDG uptake, but is too small to definitively characterize by PET. Recommend continued follow-up by chest CT in 3-4 months.   Aortic Atherosclerosis (ICD10-I70.0).   04/02/2022 - 09/28/2022 Chemotherapy   Patient is on Treatment Plan : BREAST Pembrolizumab (200) D1 + Carboplatin (5) D1 + Paclitaxel (80) D1,8,15 q21d X 4 cycles / Pembrolizumab (200) D1 + AC D1 q21d x 4 cycles      Genetic Testing   Negative genetic testing. No pathogenic variants identified on the [facility] Common Hereditary Cancers+RNA panel. The report date is 05/31/2022.  The Common Hereditary Cancers Panel + RNA offered by [facility] includes sequencing and/or deletion duplication testing of the following 48 genes: APC*, ATM*, AXIN2, BAP1, BARD1, BMPR1A, BRCA1, BRCA2, BRIP1, CDH1, CDK4, CDKN2A (p14ARF), CDKN2A (p16INK4a), CHEK2, CTNNA1, DICER1*, EPCAM*, FH*, GREM1*, HOXB13, KIT, MBD4, MEN1*, MLH1*, MSH2*, MSH3*, MSH6*, MUTYH, NF1*, NTHL1, PALB2, PDGFRA, PMS2*, POLD1*, POLE, PTEN*, RAD51C, RAD51D, SDHA*, SDHB, SDHC*, SDHD, SMAD4, SMARCA4, STK11, TP53, TSC1*, TSC2, VHL.    12/12/2022 -  Chemotherapy   Patient is on Treatment  Plan : BREAST Pembrolizumab (200) q21d x 27 weeks        INTERVAL HISTORY:   Marilyn Ford is a 62 y.o. female presenting to clinic today for follow up of locally advanced TNBC. She was last seen by me on 02/14/23.   She is here for her next dose of Keytruda.  Reports both mouth sores and diarrhea have improved with this past cycle of Xeloda.  She has not needed Imodium nor Magic mouthwash with the last cycle.  She is scheduled to start her Xeloda back today although she has not taken it because she has not had anything to eat yet.  She has been taking her magnesium and drinking a lot of Gatorade.  Feels well-hydrated.  Appetite and energy levels are 80%.  No pains at this time.  PAST MEDICAL HISTORY:   Past Medical History: Past Medical History:  Diagnosis Date   Allergy    Anxiety    Breast cancer (HCC)    Diabetes mellitus without complication (HCC)    GERD (gastroesophageal reflux disease)    Heart murmur    as a child   History of kidney stones    Hypertension    Pneumonia    Stroke [facility]    Vaginal Pap smear, abnormal     Surgical History: Past Surgical History:  Procedure Laterality Date   BREAST  BIOPSY  10/31/2022   [place] RT RADIOACTIVE SEED LOC 10/31/2022 [facility] MAMMOGRAPHY   CESAREAN SECTION     CHOLECYSTECTOMY     COLONOSCOPY WITH PROPOFOL N/A 07/19/2020   Procedure: COLONOSCOPY WITH PROPOFOL;  Surgeon: Dolores Frame, MD;  Location: [facility];  Service: Gastroenterology;  Laterality: N/A;  AM   POLYPECTOMY  07/19/2020   Procedure: POLYPECTOMY;  Surgeon: Marguerita Merles, Reuel Boom, MD;  Location: [facility];  Service: Gastroenterology;;   PORTACATH PLACEMENT N/A 03/20/2022   Procedure: INSERTION PORT-A-CATH;  Surgeon: Abigail Miyamoto, MD;  Location: [facility];  Service: General;  Laterality: N/A;   RADIOACTIVE SEED GUIDED AXILLARY SENTINEL LYMPH NODE Right 11/01/2022   Procedure: RADIOACTIVE SEED GUIDED RIGHT AXILLARY SENTINEL LYMPH NODE DISSECTION;  Surgeon:  Abigail Miyamoto, MD;  Location: [facility];  Service: General;  Laterality: Right;   SIMPLE MASTECTOMY WITH AXILLARY SENTINEL NODE BIOPSY Right 11/01/2022   Procedure: RIGHT SIMPLE MASTECTOMY;  Surgeon: Abigail Miyamoto, MD;  Location: [facility];  Service: General;  Laterality: Right;    Social History: Social History   Socioeconomic History   Marital status: Married    Spouse name: Fayrene Fearing   Number of children: 2   Years of education: 10   Highest education level: 10th grade  Occupational History   Occupation: disabled  Tobacco Use   Smoking status: Former    Current packs/day: 0.00    Average packs/day: 0.5 packs/day for 25.0 years (12.5 ttl pk-yrs)    Types: Cigarettes    Start date: 12/08/1999    Quit date: 03/12/2015    Years since quitting: 8.0   Smokeless tobacco: Never  Vaping Use   Vaping status: Never Used  Substance and Sexual Activity   Alcohol use: Yes    Alcohol/week: 2.0 - 3.0 standard drinks of alcohol    Types: 2 - 3 Glasses of wine per week   Drug use: No   Sexual activity: Not Currently    Birth control/protection: Abstinence, Post-menopausal  Other Topics Concern   Not on file  Social History Narrative   Volunteers at [employer] for a few hours every day.    She really enjoys getting out of of the house and working there.    Social Determinants of Health   Financial Resource Strain: Low Risk  (03/17/2023)   Overall Financial Resource Strain (CARDIA)    Difficulty of Paying Living Expenses: Not very hard  Food Insecurity: No Food Insecurity (03/17/2023)   Hunger Vital Sign    Worried About Running Out of Food in the Last Year: Never true    Ran Out of Food in the Last Year: Never true  Transportation Needs: No Transportation Needs (03/17/2023)   PRAPARE - Administrator, Civil Service (Medical): No    Lack of Transportation (Non-Medical): No  Physical Activity: Insufficiently Active (03/17/2023)    Exercise Vital Sign    Days of Exercise per Week: 4 days    Minutes of Exercise per Session: 10 min  Stress: No Stress Concern Present (03/17/2023)   [employer] of Occupational Health - Occupational Stress Questionnaire    Feeling of Stress : Only a little  Social Connections: Moderately Isolated (03/17/2023)   Social Connection and Isolation Panel [NHANES]    Frequency of Communication with Friends and Family: More than three times a week    Frequency of Social Gatherings with Friends and Family: Twice a week    Attends Religious Services: Never  Active Member of Clubs or Organizations: No    Attends Banker Meetings: Never    Marital Status: Married  Catering manager Violence: Not At Risk (12/13/2022)   Humiliation, Afraid, Rape, and Kick questionnaire    Fear of Current or Ex-Partner: No    Emotionally Abused: No    Physically Abused: No    Sexually Abused: No    Family History: Family History  Problem Relation Age of Onset   Diabetes Mother    Uterine cancer Mother 19 - 24   Diabetes Brother    Breast cancer Maternal Aunt        dx >50, d. from cancer   Cancer Maternal Grandmother        unk type, "back cancer?"    Current Medications:  Current Outpatient Medications:    Alcohol Swabs (B-D SINGLE USE SWABS REGULAR) PADS, Test BS daily and as needed Dx E11.9, Disp: 100 each, Rfl: 3   aluminum-magnesium hydroxide-simethicone (MAALOX) 200-200-20 MG/5ML SUSP, Take 30 mLs by mouth 4 (four) times daily -  before meals and at bedtime., Disp: 480 mL, Rfl: 2   amLODipine (NORVASC) 10 MG tablet, 1 tablet daily, Disp: 90 tablet, Rfl: 3   Apple Cider Vinegar 500 MG TABS, Take 500 mg by mouth in the morning., Disp: , Rfl:    aspirin 81 MG EC tablet, Take 1 tablet (81 mg total) by mouth daily., Disp: 30 tablet, Rfl: 0   atorvastatin (LIPITOR) 80 MG tablet, TAKE 1 TABLET EVERY DAY AT 6PM, Disp: 90 tablet, Rfl: 3   betamethasone dipropionate 0.05 % cream, Apply  topically 2 (two) times daily., Disp: 30 g, Rfl: 0   Blood Glucose Calibration (TRUE METRIX LEVEL 1) Low SOLN, Use with glucometer Dx E11.9, Disp: 3 each, Rfl: 0   Blood Glucose Monitoring Suppl (TRUE METRIX AIR GLUCOSE METER) w/Device KIT, Test BS daily and as needed Dx E11.9, Disp: 1 kit, Rfl: 0   Calcium Carb-Cholecalciferol (CALCIUM-VITAMIN D) 600-400 MG-UNIT TABS, Take 1 tablet by mouth in the morning., Disp: , Rfl:    capecitabine (XELODA) 500 MG tablet, Take 2 tablets (1,000 mg total) by mouth 2 (two) times daily after a meal. Take for 14 days, then hold for 7 days. Repeat every 21 days., Disp: 56 tablet, Rfl: 5   cetirizine (ZYRTEC) 10 MG tablet, Take 1 tablet (10 mg total) by mouth daily., Disp: 30 tablet, Rfl: 3   Coenzyme Q10 (COQ10) 100 MG CAPS, Take 100 mg by mouth in the morning., Disp: , Rfl:    Cranberry 425 MG CAPS, Take 425 mg by mouth in the morning and at bedtime., Disp: , Rfl:    diphenoxylate-atropine (LOMOTIL) 2.5-0.025 MG tablet, Take 1 tablet by mouth 4 (four) times daily as needed for diarrhea or loose stools., Disp: 30 tablet, Rfl: 0   escitalopram (LEXAPRO) 10 MG tablet, Take 1 tablet (10 mg total) by mouth daily., Disp: 90 tablet, Rfl: 1   Flaxseed, Linseed, (FLAXSEED OIL) 1000 MG CAPS, Take 1,000 mg by mouth in the morning., Disp: , Rfl:    gabapentin (NEURONTIN) 300 MG capsule, Take 1 capsule (300 mg total) by mouth 2 (two) times daily., Disp: 60 capsule, Rfl: 0   glucose blood (TRUE METRIX BLOOD GLUCOSE TEST) test strip, Test BS daily and as needed Dx E11.9, Disp: 100 each, Rfl: 3   icosapent Ethyl (VASCEPA) 1 g capsule, Take 2 g by mouth 2 (two) times daily., Disp: , Rfl:    Krill Oil 500  MG CAPS, Take 3 capsules (1,500 mg total) by mouth in the morning and at bedtime., Disp: , Rfl:    lidocaine (XYLOCAINE) 2 % solution, Use as directed 15 mLs in the mouth or throat every 6 (six) hours as needed for mouth pain., Disp: 250 mL, Rfl: 1   LORazepam (ATIVAN) 1 MG tablet,  Take 1 tablet (1 mg total) by mouth 2 (two) times daily as needed for anxiety., Disp: 10 tablet, Rfl: 0   magnesium oxide (MAG-OX) 400 (240 Mg) MG tablet, TAKE 1 TABLET BY MOUTH TWICE A DAY, Disp: 60 tablet, Rfl: 1   meloxicam (MOBIC) 15 MG tablet, TAKE 1 TABLET (15 MG TOTAL) BY MOUTH DAILY FOR JOINT AND MUSCLE PAIN, Disp: 90 tablet, Rfl: 0   metoprolol (TOPROL-XL) 200 MG 24 hr tablet, TAKE 1 TABLET ONE TIME DAILY, WITH OR IMMEDIATELY FOLLOWING A MEAL, Disp: 90 tablet, Rfl: 3   Multiple Vitamin (MULTIVITAMIN) capsule, Take 1 capsule by mouth in the morning., Disp: , Rfl:    ondansetron (ZOFRAN) 8 MG tablet, Take 1 tablet (8 mg total) by mouth every 8 (eight) hours as needed for nausea or vomiting., Disp: 30 tablet, Rfl: 3   pantoprazole (PROTONIX) 40 MG tablet, TAKE 1 TABLET twice daily FOR STOMACH, Disp: 180 tablet, Rfl: 3   traZODone (DESYREL) 150 MG tablet, TAKE 1 OR 2 TABLETS AT BEDTIME FOR SLEEP, Disp: 180 tablet, Rfl: 3   TRUEplus Lancets 33G MISC, Test BS daily and as needed Dx E11.9, Disp: 100 each, Rfl: 3   valsartan (DIOVAN) 320 MG tablet, Take 1 tablet (320 mg total) by mouth daily. For blood pressure., Disp: 90 tablet, Rfl: 3   zinc gluconate 50 MG tablet, Take 50 mg by mouth daily., Disp: , Rfl:    Zinc Oxide 20 % PSTE, Apply 1 Application topically 3 (three) times daily., Disp: 57 g, Rfl: 1   Allergies: Allergies  Allergen Reactions   Penicillins Shortness Of Breath   Sulfa Antibiotics Rash    REVIEW OF SYSTEMS:   Review of Systems  Constitutional:  Negative for appetite change, fatigue, fever and unexpected weight change.  HENT:   Negative for nosebleeds, sore throat and trouble swallowing.   Eyes: Negative.   Respiratory: Negative.  Negative for cough, shortness of breath and wheezing.   Cardiovascular: Negative.  Negative for chest pain and leg swelling.  Gastrointestinal:  Negative for abdominal pain, blood in stool, constipation, diarrhea, nausea and vomiting.   Endocrine: Negative.   Genitourinary: Negative.  Negative for bladder incontinence, hematuria and nocturia.   Musculoskeletal: Negative.  Negative for back pain and flank pain.  Skin: Negative.   Neurological: Negative.  Negative for dizziness, headaches, light-headedness and numbness.  Hematological: Negative.   Psychiatric/Behavioral: Negative.  Negative for confusion. The patient is not nervous/anxious.      VITALS:   There were no vitals taken for this visit.  Wt Readings from Last 3 Encounters:  03/28/23 141 lb (64 kg)  03/20/23 139 lb 3.2 oz (63.1 kg)  03/07/23 142 lb 4.8 oz (64.5 kg)    There is no height or weight on file to calculate BMI.  Performance status (ECOG): 1 - Symptomatic but completely ambulatory  PHYSICAL EXAM:   Physical Exam Constitutional:      Appearance: Normal appearance.  Cardiovascular:     Rate and Rhythm: Normal rate and regular rhythm.  Pulmonary:     Effort: Pulmonary effort is normal.     Breath sounds: Normal breath sounds.  Abdominal:     General: Bowel sounds are normal.     Palpations: Abdomen is soft.  Musculoskeletal:        General: No swelling. Normal range of motion.  Neurological:     Mental Status: She is alert and oriented to person, place, and time. Mental status is at baseline.    LABS:      Latest Ref Rng & Units 03/20/2023   10:13 AM 03/07/2023    9:50 AM 02/14/2023    9:31 AM  CBC  WBC 3.4 - 10.8 x10E3/uL 4.0  3.7  3.9   Hemoglobin 11.1 - 15.9 g/dL 8.8  8.8  9.1   Hematocrit 34.0 - 46.6 % 26.0  26.0  26.7   Platelets 150 - 450 x10E3/uL 163  127  105       Latest Ref Rng & Units 03/20/2023   10:13 AM 03/07/2023    9:50 AM 02/14/2023    9:31 AM  CMP  Glucose 70 - 99 mg/dL 161  096  045   BUN 8 - 27 mg/dL 20  17  24    Creatinine 0.57 - 1.00 mg/dL 4.09  8.11  9.14   Sodium 134 - 144 mmol/L 143  138  136   Potassium 3.5 - 5.2 mmol/L 4.5  4.0  4.2   Chloride 96 - 106 mmol/L 103  101  103   CO2 20 - 29  mmol/L 25  26  27    Calcium 8.7 - 10.3 mg/dL 9.7  9.3  8.6   Total Protein 6.0 - 8.5 g/dL 6.8  6.8  6.6   Total Bilirubin 0.0 - 1.2 mg/dL 1.0  1.0  1.1   Alkaline Phos 44 - 121 IU/L 123  101  92   AST 0 - 40 IU/L 24  22  21    ALT 0 - 32 IU/L 21  20  20       No results found for: "CEA1", "CEA" / No results found for: "CEA1", "CEA" No results found for: "PSA1" No results found for: "NWG956" No results found for: "CAN125"  No results found for: "TOTALPROTELP", "ALBUMINELP", "A1GS", "A2GS", "BETS", "BETA2SER", "GAMS", "MSPIKE", "SPEI" Lab Results  Component Value Date   TIBC 350 02/14/2023   FERRITIN 599 (H) 02/14/2023   IRONPCTSAT 39 (H) 02/14/2023   No results found for: "LDH"   STUDIES:   No results found.

## 2023-03-28 NOTE — Progress Notes (Signed)
Patient presents today for Keytruda infusion. Patient is in satisfactory condition with no new complaints voiced.  Vital signs are stable.  Labs reviewed by Durenda Hurt, NP during the office visit and all labs are within treatment parameters.  We will proceed with treatment per provider orders.   Patient tolerated treatment well with no complaints voiced.  Patient left ambulatory with husband in stable condition.  Vital signs stable at discharge.  Follow up as scheduled.

## 2023-03-28 NOTE — Patient Instructions (Signed)
 [facility] - A DEPT OF [facility]  Discharge Instructions: Thank you for choosing [facility] to provide your oncology and hematology care.  If you have a lab appointment with the Cancer Center - please note that after April 8th, 2024, all labs will be drawn in the cancer center.  You do not have to check in or register with the main entrance as you have in the past but will complete your check-in in the cancer center.  Wear comfortable clothing and clothing appropriate for easy access to any Portacath or PICC line.   We strive to give you quality time with your provider. You may need to reschedule your appointment if you arrive late (15 or more minutes).  Arriving late affects you and other patients whose appointments are after yours.  Also, if you miss three or more appointments without notifying the office, you may be dismissed from the clinic at the provider's discretion.      For prescription refill requests, have your pharmacy contact our office and allow 72 hours for refills to be completed.    Today you received the following chemotherapy and/or immunotherapy agents Keytruda.  Pembrolizumab Injection What is this medication? PEMBROLIZUMAB (PEM broe LIZ ue mab) treats some types of cancer. It works by helping your immune system slow or stop the spread of cancer cells. It is a monoclonal antibody. This medicine may be used for other purposes; ask your health care provider or pharmacist if you have questions. COMMON BRAND NAME(S): Keytruda What should I tell my care team before I take this medication? They need to know if you have any of these conditions: Allogeneic stem cell transplant (uses someone else's stem cells) Autoimmune diseases, such as Crohn disease, ulcerative colitis, lupus History of chest radiation Nervous system problems, such as Guillain-Barre syndrome, myasthenia gravis Organ transplant An unusual or allergic reaction  to pembrolizumab, other medications, foods, dyes, or preservatives Pregnant or trying to get pregnant Breast-feeding How should I use this medication? This medication is injected into a vein. It is given by your care team in a hospital or clinic setting. A special MedGuide will be given to you before each treatment. Be sure to read this information carefully each time. Talk to your care team about the use of this medication in children. While it may be prescribed for children as young as 6 months for selected conditions, precautions do apply. Overdosage: If you think you have taken too much of this medicine contact a poison control center or emergency room at once. NOTE: This medicine is only for you. Do not share this medicine with others. What if I miss a dose? Keep appointments for follow-up doses. It is important not to miss your dose. Call your care team if you are unable to keep an appointment. What may interact with this medication? Interactions have not been studied. This list may not describe all possible interactions. Give your health care provider a list of all the medicines, herbs, non-prescription drugs, or dietary supplements you use. Also tell them if you smoke, drink alcohol, or use illegal drugs. Some items may interact with your medicine. What should I watch for while using this medication? Your condition will be monitored carefully while you are receiving this medication. You may need blood work while taking this medication. This medication may cause serious skin reactions. They can happen weeks to months after starting the medication. Contact your care team right away if you notice  fevers or flu-like symptoms with a rash. The rash may be red or purple and then turn into blisters or peeling of the skin. You may also notice a red rash with swelling of the face, lips, or lymph nodes in your neck or under your arms. Tell your care team right away if you have any change in your  eyesight. Talk to your care team if you may be pregnant. Serious birth defects can occur if you take this medication during pregnancy and for 4 months after the last dose. You will need a negative pregnancy test before starting this medication. Contraception is recommended while taking this medication and for 4 months after the last dose. Your care team can help you find the option that works for you. Do not breastfeed while taking this medication and for 4 months after the last dose. What side effects may I notice from receiving this medication? Side effects that you should report to your care team as soon as possible: Allergic reactions--skin rash, itching, hives, swelling of the face, lips, tongue, or throat Dry cough, shortness of breath or trouble breathing Eye pain, redness, irritation, or discharge with blurry or decreased vision Heart muscle inflammation--unusual weakness or fatigue, shortness of breath, chest pain, fast or irregular heartbeat, dizziness, swelling of the ankles, feet, or hands Hormone gland problems--headache, sensitivity to light, unusual weakness or fatigue, dizziness, fast or irregular heartbeat, increased sensitivity to cold or heat, excessive sweating, constipation, hair loss, increased thirst or amount of urine, tremors or shaking, irritability Infusion reactions--chest pain, shortness of breath or trouble breathing, feeling faint or lightheaded Kidney injury (glomerulonephritis)--decrease in the amount of urine, red or dark Kimo Bancroft urine, foamy or bubbly urine, swelling of the ankles, hands, or feet Liver injury--right upper belly pain, loss of appetite, nausea, light-colored stool, dark yellow or Marilyn Ford urine, yellowing skin or eyes, unusual weakness or fatigue Pain, tingling, or numbness in the hands or feet, muscle weakness, change in vision, confusion or trouble speaking, loss of balance or coordination, trouble walking, seizures Rash, fever, and swollen lymph  nodes Redness, blistering, peeling, or loosening of the skin, including inside the mouth Sudden or severe stomach pain, bloody diarrhea, fever, nausea, vomiting Side effects that usually do not require medical attention (report to your care team if they continue or are bothersome): Bone, joint, or muscle pain Diarrhea Fatigue Loss of appetite Nausea Skin rash This list may not describe all possible side effects. Call your doctor for medical advice about side effects. You may report side effects to FDA at 1-800-FDA-1088. Where should I keep my medication? This medication is given in a hospital or clinic. It will not be stored at home. NOTE: This sheet is a summary. It may not cover all possible information. If you have questions about this medicine, talk to your doctor, pharmacist, or health care provider.  2024 Elsevier/Gold Standard (2021-09-05 00:00:00)        To help prevent nausea and vomiting after your treatment, we encourage you to take your nausea medication as directed.  BELOW ARE SYMPTOMS THAT SHOULD BE REPORTED IMMEDIATELY: *FEVER GREATER THAN 100.4 F (38 C) OR HIGHER *CHILLS OR SWEATING *NAUSEA AND VOMITING THAT IS NOT CONTROLLED WITH YOUR NAUSEA MEDICATION *UNUSUAL SHORTNESS OF BREATH *UNUSUAL BRUISING OR BLEEDING *URINARY PROBLEMS (pain or burning when urinating, or frequent urination) *BOWEL PROBLEMS (unusual diarrhea, constipation, pain near the anus) TENDERNESS IN MOUTH AND THROAT WITH OR WITHOUT PRESENCE OF ULCERS (sore throat, sores in mouth, or a toothache) UNUSUAL RASH,  SWELLING OR PAIN  UNUSUAL VAGINAL DISCHARGE OR ITCHING   Items with * indicate a potential emergency and should be followed up as soon as possible or go to the Emergency Department if any problems should occur.  Please show the CHEMOTHERAPY ALERT CARD or IMMUNOTHERAPY ALERT CARD at check-in to the Emergency Department and triage nurse.  Should you have questions after your visit or need to  cancel or reschedule your appointment, please contact [facility] - A DEPT OF Eligha Bridegroom [facility] [phone]  and follow the prompts.  Office hours are 8:00 a.m. to 4:30 p.m. Monday - Friday. Please note that voicemails left after 4:00 p.m. may not be returned until the following business day.  We are closed weekends and major holidays. You have access to a nurse at all times for urgent questions. Please call the main number to the clinic [phone] and follow the prompts.  For any non-urgent questions, you may also contact your provider using MyChart. We now offer e-Visits for anyone 46 and older to request care online for non-urgent symptoms. For details visit [email or web address].   Also download the MyChart app! Go to the app store, search "MyChart", open the app, select [facility], and log in with your MyChart username and password.

## 2023-03-29 LAB — T4: T4, Total: 11.9 ug/dL (ref 4.5–12.0)

## 2023-04-10 ENCOUNTER — Other Ambulatory Visit: Payer: Self-pay

## 2023-04-10 NOTE — Progress Notes (Signed)
Specialty Pharmacy Refill Coordination Note  Marilyn Ford is a 62 y.o. female contacted today regarding refills of specialty medication(s) Capecitabine   Patient requested Delivery   Delivery date: 04/12/23   Verified address: [address] [facility] [address]   Medication will be filled on 04/11/23.

## 2023-04-11 ENCOUNTER — Other Ambulatory Visit: Payer: Self-pay

## 2023-04-22 ENCOUNTER — Inpatient Hospital Stay: Payer: No Typology Code available for payment source | Attending: Hematology and Oncology

## 2023-04-22 ENCOUNTER — Inpatient Hospital Stay: Payer: No Typology Code available for payment source

## 2023-04-22 VITALS — BP 141/89 | HR 63 | Temp 97.5°F | Resp 16

## 2023-04-22 DIAGNOSIS — Z171 Estrogen receptor negative status [ER-]: Secondary | ICD-10-CM | POA: Insufficient documentation

## 2023-04-22 DIAGNOSIS — Z5112 Encounter for antineoplastic immunotherapy: Secondary | ICD-10-CM | POA: Insufficient documentation

## 2023-04-22 DIAGNOSIS — C50811 Malignant neoplasm of overlapping sites of right female breast: Secondary | ICD-10-CM | POA: Diagnosis not present

## 2023-04-22 DIAGNOSIS — C773 Secondary and unspecified malignant neoplasm of axilla and upper limb lymph nodes: Secondary | ICD-10-CM | POA: Insufficient documentation

## 2023-04-22 LAB — COMPREHENSIVE METABOLIC PANEL
ALT: 23 U/L (ref 0–44)
AST: 23 U/L (ref 15–41)
Albumin: 4 g/dL (ref 3.5–5.0)
Alkaline Phosphatase: 90 U/L (ref 38–126)
Anion gap: 9 (ref 5–15)
BUN: 21 mg/dL (ref 8–23)
CO2: 27 mmol/L (ref 22–32)
Calcium: 9.3 mg/dL (ref 8.9–10.3)
Chloride: 100 mmol/L (ref 98–111)
Creatinine, Ser: 0.83 mg/dL (ref 0.44–1.00)
GFR, Estimated: >60 mL/min (ref >60)
Glucose, Bld: 130 mg/dL — ABNORMAL HIGH (ref 70–99)
Potassium: 4.2 mmol/L (ref 3.5–5.1)
Sodium: 136 mmol/L (ref 135–145)
Total Bilirubin: 1 mg/dL (ref <1.2)
Total Protein: 6.9 g/dL (ref 6.5–8.1)

## 2023-04-22 LAB — CBC WITH DIFFERENTIAL/PLATELET
Abs Immature Granulocytes: 0 10*3/uL (ref 0.00–0.07)
Basophils Absolute: 0 10*3/uL (ref 0.0–0.1)
Basophils Relative: 0 %
Eosinophils Absolute: 0.1 10*3/uL (ref 0.0–0.5)
Eosinophils Relative: 3 %
HCT: 28.7 % — ABNORMAL LOW (ref 36.0–46.0)
Hemoglobin: 10.3 g/dL — ABNORMAL LOW (ref 12.0–15.0)
Immature Granulocytes: 0 %
Lymphocytes Relative: 18 %
Lymphs Abs: 0.6 10*3/uL — ABNORMAL LOW (ref 0.7–4.0)
MCH: 40.4 pg — ABNORMAL HIGH (ref 26.0–34.0)
MCHC: 35.9 g/dL (ref 30.0–36.0)
MCV: 112.5 fL — ABNORMAL HIGH (ref 80.0–100.0)
Monocytes Absolute: 0.3 10*3/uL (ref 0.1–1.0)
Monocytes Relative: 9 %
Neutro Abs: 2.3 10*3/uL (ref 1.7–7.7)
Neutrophils Relative %: 70 %
Platelets: 115 10*3/uL — ABNORMAL LOW (ref 150–400)
RBC: 2.55 MIL/uL — ABNORMAL LOW (ref 3.87–5.11)
RDW: 15.4 % (ref 11.5–15.5)
WBC: 3.3 10*3/uL — ABNORMAL LOW (ref 4.0–10.5)
nRBC: 0 % (ref 0.0–0.2)

## 2023-04-22 LAB — SAMPLE TO BLOOD BANK

## 2023-04-22 LAB — MAGNESIUM: Magnesium: 2 mg/dL (ref 1.7–2.4)

## 2023-04-22 MED ORDER — HEPARIN SOD (PORK) LOCK FLUSH 100 UNIT/ML IV SOLN
500.0000 [IU] | Freq: Once | INTRAVENOUS | Status: AC | PRN
  Administered 2023-04-22: 500 [IU]

## 2023-04-22 MED ORDER — SODIUM CHLORIDE 0.9% FLUSH
10.0000 mL | Freq: Once | INTRAVENOUS | Status: AC
  Administered 2023-04-22: 10 mL via INTRAVENOUS

## 2023-04-22 MED ORDER — SODIUM CHLORIDE 0.9 % IV SOLN
200.0000 mg | Freq: Once | INTRAVENOUS | Status: AC
  Administered 2023-04-22: 200 mg via INTRAVENOUS
  Filled 2023-04-22: qty 8

## 2023-04-22 MED ORDER — SODIUM CHLORIDE 0.9 % IV SOLN: Freq: Once | INTRAVENOUS | Status: AC

## 2023-04-22 MED ORDER — SODIUM CHLORIDE 0.9% FLUSH
10.0000 mL | INTRAVENOUS | Status: DC | PRN
Start: 2023-04-22 — End: 2023-04-22
  Administered 2023-04-22: 10 mL

## 2023-04-22 NOTE — Patient Instructions (Signed)
[facility] [place] [facility]  Discharge Instructions: Thank you for choosing [facility] to provide your oncology and hematology care.  If you have a lab appointment with the Cancer Center - please note that after April 8th, 2024, all labs will be drawn in the cancer center.  You do not have to check in or register with the main entrance as you have in the past but will complete your check-in in the cancer center.  Wear comfortable clothing and clothing appropriate for easy access to any Portacath or PICC line.   We strive to give you quality time with your provider. You may need to reschedule your appointment if you arrive late (15 or more minutes).  Arriving late affects you and other patients whose appointments are after yours.  Also, if you miss three or more appointments without notifying the office, you may be dismissed from the clinic at the provider's discretion.      For prescription refill requests, have your pharmacy contact our office and allow 72 hours for refills to be completed.    Today you received the following chemotherapy and/or immunotherapy agents pembrolizumab   To help prevent nausea and vomiting after your treatment, we encourage you to take your nausea medication as directed.  BELOW ARE SYMPTOMS THAT SHOULD BE REPORTED IMMEDIATELY: *FEVER GREATER THAN 100.4 F (38 C) OR HIGHER *CHILLS OR SWEATING *NAUSEA AND VOMITING THAT IS NOT CONTROLLED WITH YOUR NAUSEA MEDICATION *UNUSUAL SHORTNESS OF BREATH *UNUSUAL BRUISING OR BLEEDING *URINARY PROBLEMS (pain or burning when urinating, or frequent urination) *BOWEL PROBLEMS (unusual diarrhea, constipation, pain near the anus) TENDERNESS IN MOUTH AND THROAT WITH OR WITHOUT PRESENCE OF ULCERS (sore throat, sores in mouth, or a toothache) UNUSUAL RASH, SWELLING OR PAIN  UNUSUAL VAGINAL DISCHARGE OR ITCHING   Items with * indicate a potential emergency and should be followed  up as soon as possible or go to the Emergency Department if any problems should occur.  Please show the CHEMOTHERAPY ALERT CARD or IMMUNOTHERAPY ALERT CARD at check-in to the Emergency Department and triage nurse.  Should you have questions after your visit or need to cancel or reschedule your appointment, please contact [facility] [place] - A DEPT OF [facility]igh[facility] Bridegroom [facility] [phone]  and follow the prompts.  Office hours are 8:00 a.m. to 4:30 p.m. Monday - Friday. Please note that voicemails left after 4:00 p.m. may not be returned until the following business day.  We are closed weekends and major holidays. You have access to a nurse at all times for urgent questions. Please call the main number to the clinic [phone] and follow the prompts.  For any non-urgent questions, you may also contact your provider using MyChart. We now offer e-Visits for anyone 92 and older to request care online for non-urgent symptoms. For details visit [email or web address].   Also download the MyChart app! Go to the app store, search "MyChart", open the app, select [facility], and log in with your MyChart username and password.

## 2023-04-22 NOTE — Progress Notes (Signed)
Labs reviewed today, pt is having blurred vision still but comes and goes. MD notified. Ok to treat per MD.     Advised patient to go to the eye doctor. Pt aware.   Treatment given per orders. Patient tolerated it well without problems. Vitals stable and discharged home from clinic ambulatory. Follow up as scheduled.

## 2023-04-26 DIAGNOSIS — I129 Hypertensive chronic kidney disease with stage 1 through stage 4 chronic kidney disease, or unspecified chronic kidney disease: Secondary | ICD-10-CM | POA: Diagnosis not present

## 2023-04-26 DIAGNOSIS — I69351 Hemiplegia and hemiparesis following cerebral infarction affecting right dominant side: Secondary | ICD-10-CM | POA: Diagnosis not present

## 2023-04-26 DIAGNOSIS — I700 Atherosclerosis of aorta: Secondary | ICD-10-CM | POA: Diagnosis not present

## 2023-04-26 DIAGNOSIS — E785 Hyperlipidemia, unspecified: Secondary | ICD-10-CM | POA: Diagnosis not present

## 2023-04-26 DIAGNOSIS — C50919 Malignant neoplasm of unspecified site of unspecified female breast: Secondary | ICD-10-CM | POA: Diagnosis not present

## 2023-04-26 DIAGNOSIS — Z008 Encounter for other general examination: Secondary | ICD-10-CM | POA: Diagnosis not present

## 2023-04-26 DIAGNOSIS — N182 Chronic kidney disease, stage 2 (mild): Secondary | ICD-10-CM | POA: Diagnosis not present

## 2023-04-26 DIAGNOSIS — G620 Drug-induced polyneuropathy: Secondary | ICD-10-CM | POA: Diagnosis not present

## 2023-04-26 DIAGNOSIS — R2681 Unsteadiness on feet: Secondary | ICD-10-CM | POA: Diagnosis not present

## 2023-04-26 DIAGNOSIS — C779 Secondary and unspecified malignant neoplasm of lymph node, unspecified: Secondary | ICD-10-CM | POA: Diagnosis not present

## 2023-04-26 DIAGNOSIS — D84821 Immunodeficiency due to drugs: Secondary | ICD-10-CM | POA: Diagnosis not present

## 2023-04-26 DIAGNOSIS — E663 Overweight: Secondary | ICD-10-CM | POA: Diagnosis not present

## 2023-04-26 DIAGNOSIS — Z6826 Body mass index (BMI) 26.0-26.9, adult: Secondary | ICD-10-CM | POA: Diagnosis not present

## 2023-04-26 DIAGNOSIS — T451X5S Adverse effect of antineoplastic and immunosuppressive drugs, sequela: Secondary | ICD-10-CM | POA: Diagnosis not present

## 2023-04-29 ENCOUNTER — Other Ambulatory Visit: Payer: Self-pay | Admitting: *Deleted

## 2023-04-29 ENCOUNTER — Ambulatory Visit: Payer: No Typology Code available for payment source | Attending: Surgery

## 2023-04-29 IMAGING — MG MM DIGITAL SCREENING BILAT W/ TOMO AND CAD
6 of 12 series · 6 of 36 positions shown · non-contrast
Comparison: Previous exam(s).

CLINICAL DATA: Screening.

EXAM:
DIGITAL SCREENING BILATERAL MAMMOGRAM WITH TOMOSYNTHESIS AND CAD
TECHNIQUE: Bilateral screening digital craniocaudal and mediolateral oblique
mammograms were obtained. Bilateral screening digital breast
tomosynthesis was performed. The images were evaluated with
computer-aided detection.

[R CC synth-2D]
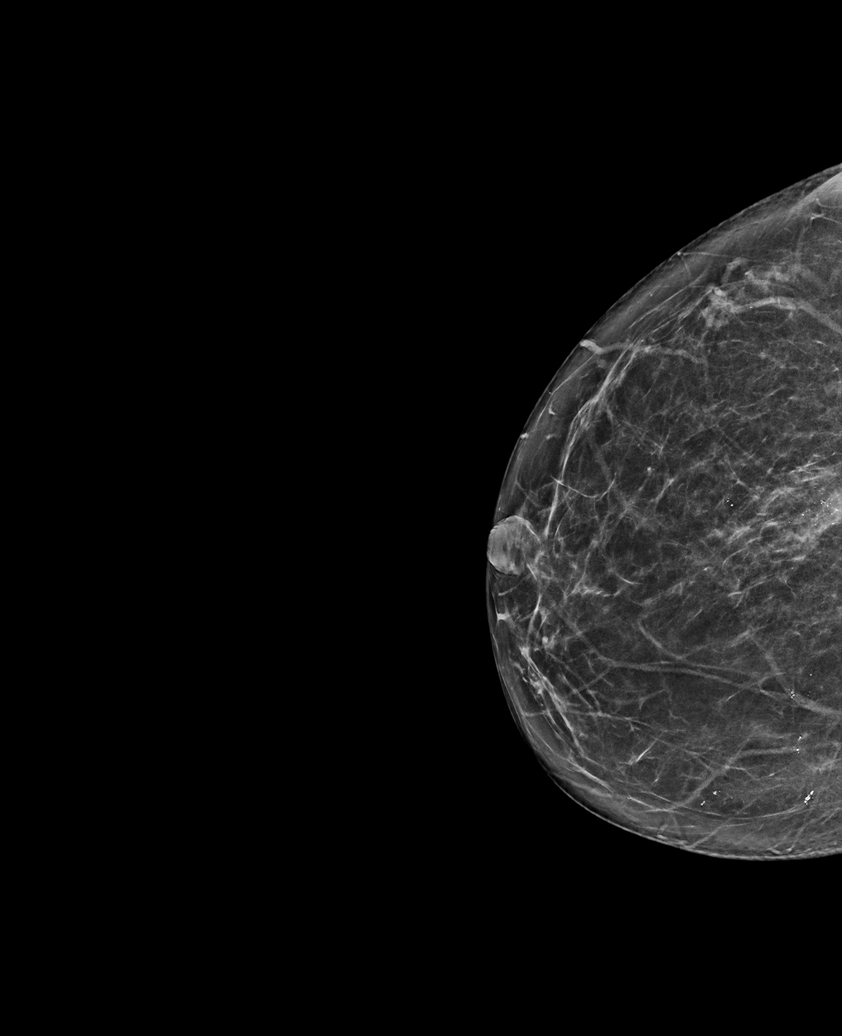

[L MLO synth-2D (1 of 2)]
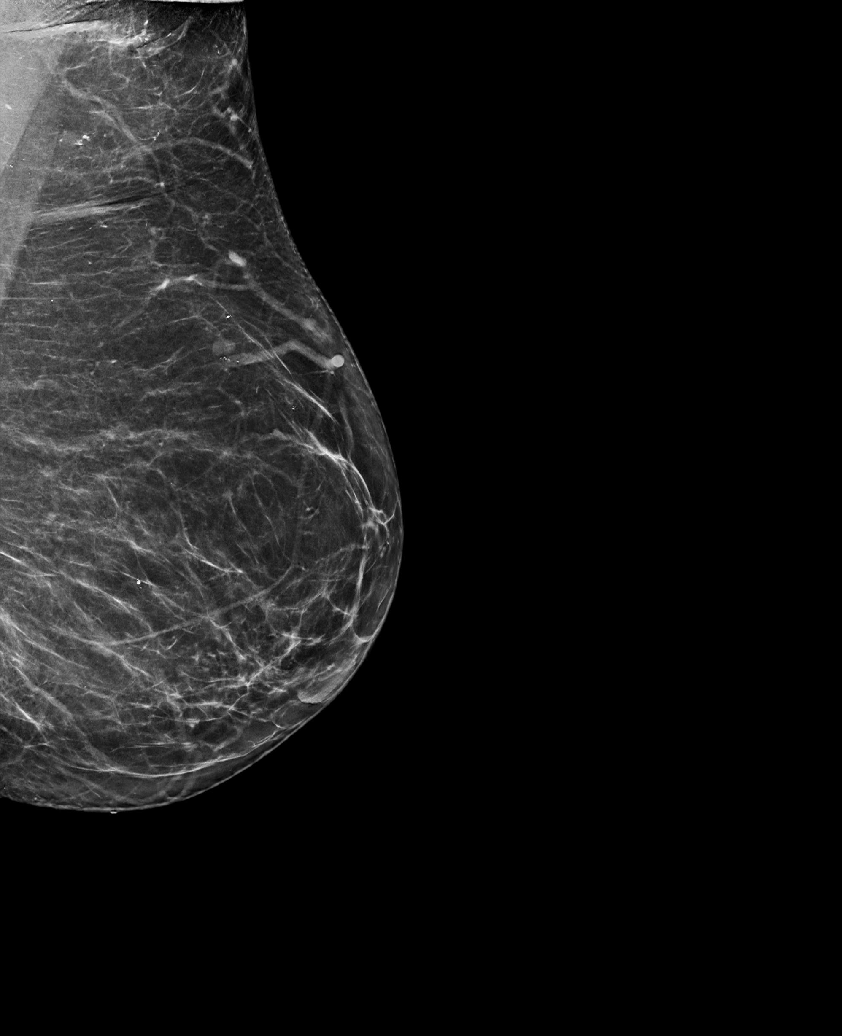

[R MLO synth-2D]
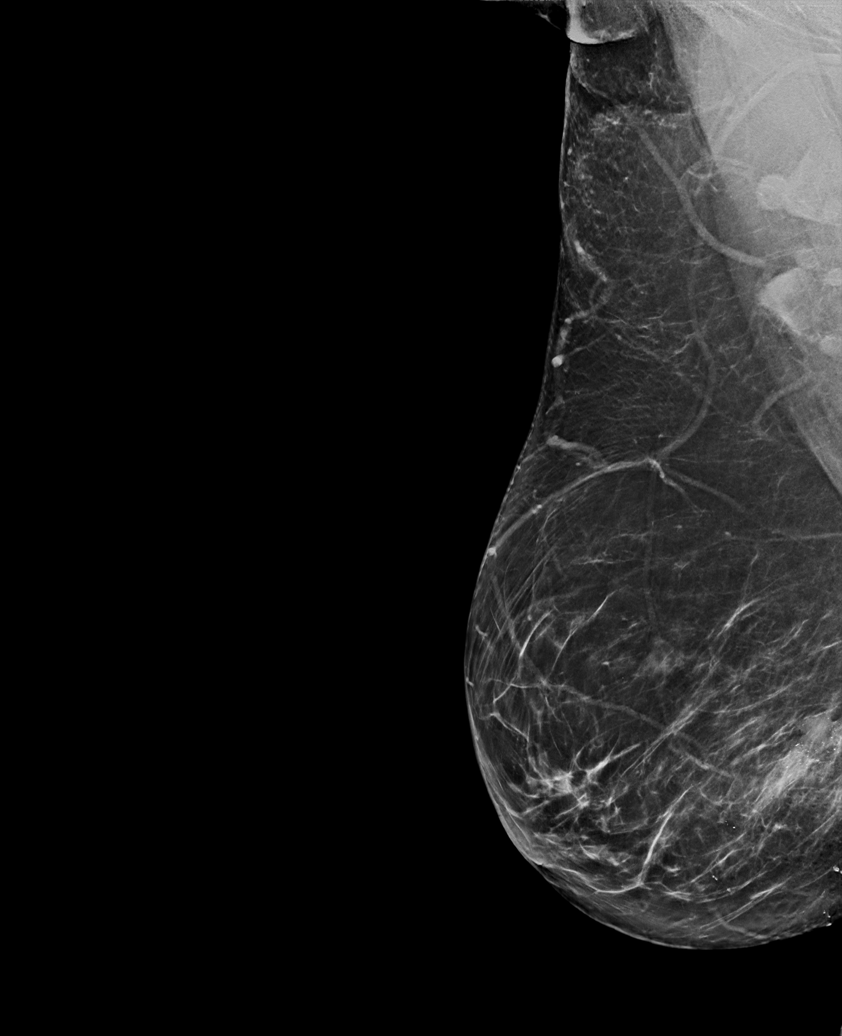

[R XCCL synth-2D]
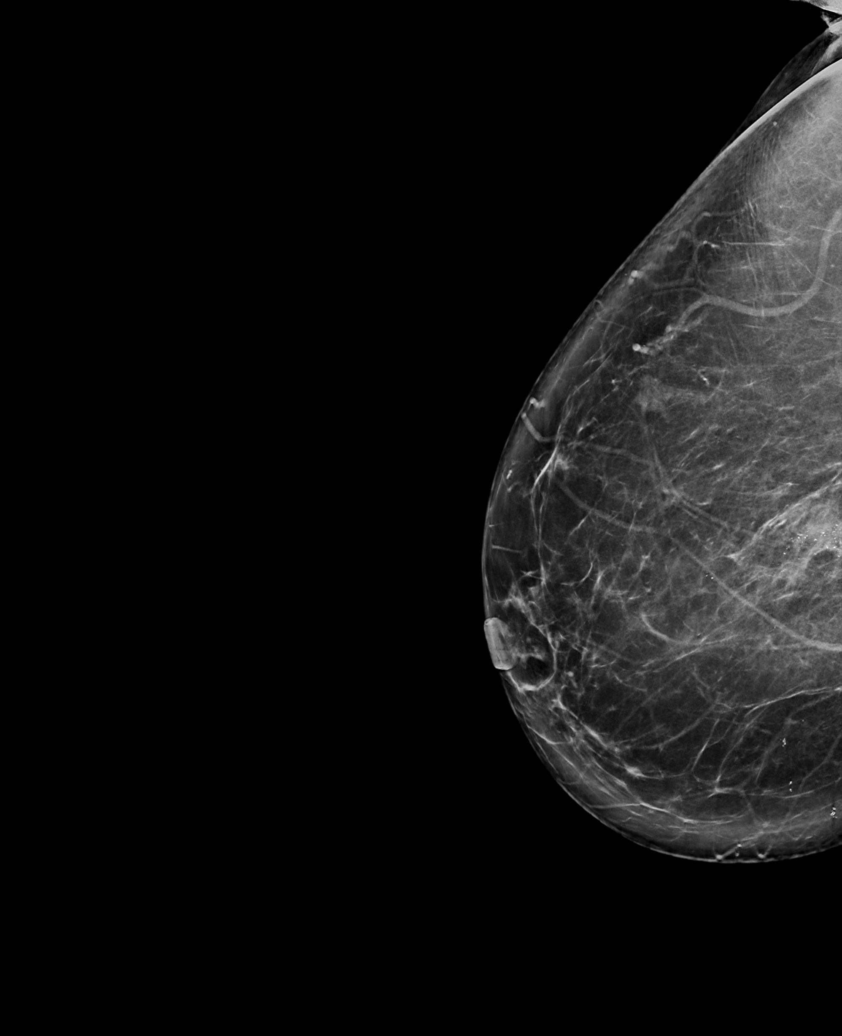

[L MLO synth-2D (2 of 2)]
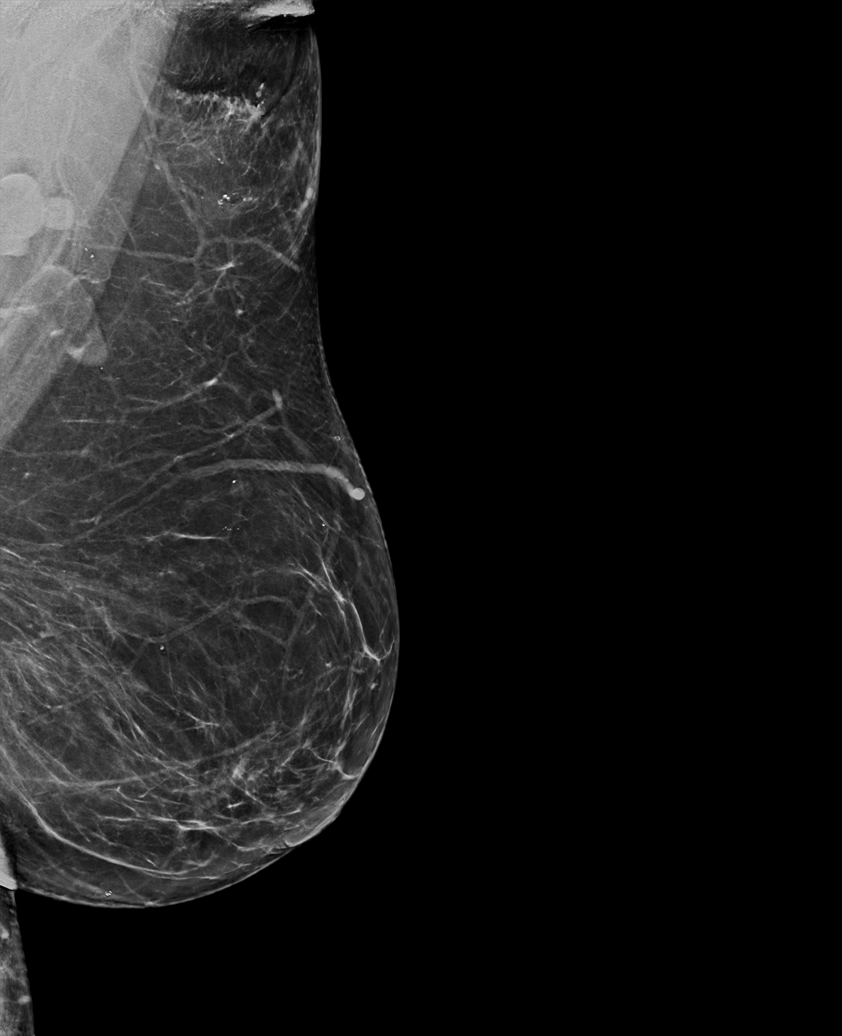

[L CC synth-2D]
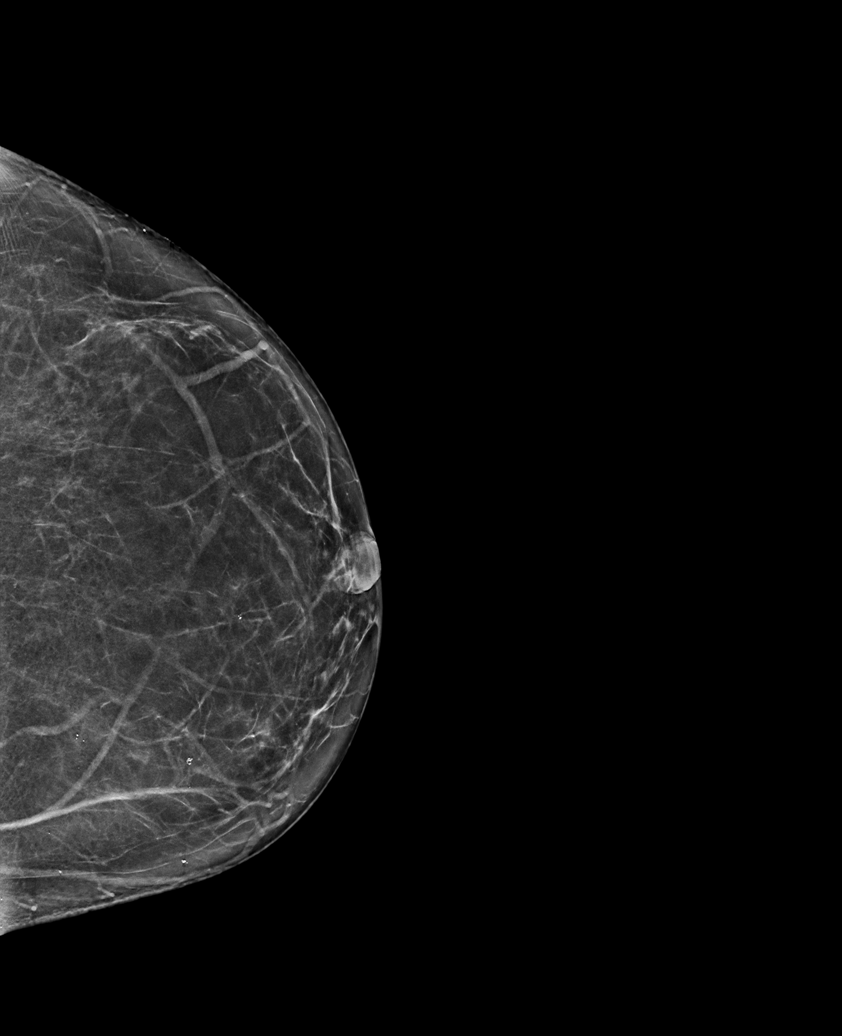

[6 of 36 positions shown; findings below may reference images not displayed]

ACR Breast Density Category b: There are scattered areas of
fibroglandular density.
FINDINGS: In the right breast, a possible asymmetry and calcifications
warrants further evaluation. In the left breast, no findings
suspicious for malignancy.
IMPRESSION: Further evaluation is suggested for possible asymmetry and
calcifications in the right breast.

RECOMMENDATION:
Diagnostic mammogram and possibly ultrasound of the right breast.
(Code:S8-2-55G)

The patient will be contacted regarding the findings, and additional
imaging will be scheduled.

BI-RADS CATEGORY  0: Incomplete. Need additional imaging evaluation
and/or prior mammograms for comparison.

## 2023-04-29 MED ORDER — ALUMINUM-MAGNESIUM-SIMETHICONE 200-200-20 MG/5ML PO SUSP: 30.0000 mL | Freq: Three times a day (TID) | ORAL | 2 refills | Status: DC

## 2023-04-29 NOTE — Telephone Encounter (Signed)
Refilled maalox/lidocaine prescription per standing orders.

## 2023-04-30 ENCOUNTER — Other Ambulatory Visit: Payer: Self-pay

## 2023-04-30 ENCOUNTER — Other Ambulatory Visit (HOSPITAL_COMMUNITY): Payer: Self-pay

## 2023-04-30 NOTE — Progress Notes (Signed)
Specialty Pharmacy Refill Coordination Note  Marilyn Ford is a 62 y.o. female contacted today regarding refills of specialty medication(s) Capecitabine (XELODA)   Patient requested Delivery   Delivery date: 05/03/23   Verified address: [address]  [place] [address]   Medication will be filled on 05/02/23.

## 2023-05-13 ENCOUNTER — Inpatient Hospital Stay (HOSPITAL_BASED_OUTPATIENT_CLINIC_OR_DEPARTMENT_OTHER): Payer: No Typology Code available for payment source | Admitting: Hematology

## 2023-05-13 ENCOUNTER — Inpatient Hospital Stay: Payer: No Typology Code available for payment source | Attending: Hematology and Oncology

## 2023-05-13 ENCOUNTER — Telehealth: Payer: Self-pay | Admitting: Radiation Oncology

## 2023-05-13 ENCOUNTER — Inpatient Hospital Stay: Payer: No Typology Code available for payment source

## 2023-05-13 VITALS — BP 115/53 | HR 65 | Temp 97.3°F | Resp 18

## 2023-05-13 DIAGNOSIS — Z171 Estrogen receptor negative status [ER-]: Secondary | ICD-10-CM | POA: Insufficient documentation

## 2023-05-13 DIAGNOSIS — C773 Secondary and unspecified malignant neoplasm of axilla and upper limb lymph nodes: Secondary | ICD-10-CM | POA: Insufficient documentation

## 2023-05-13 DIAGNOSIS — Z1732 Human epidermal growth factor receptor 2 negative status: Secondary | ICD-10-CM | POA: Insufficient documentation

## 2023-05-13 DIAGNOSIS — Z5112 Encounter for antineoplastic immunotherapy: Secondary | ICD-10-CM | POA: Insufficient documentation

## 2023-05-13 DIAGNOSIS — Z79620 Long term (current) use of immunosuppressive biologic: Secondary | ICD-10-CM | POA: Insufficient documentation

## 2023-05-13 DIAGNOSIS — C50811 Malignant neoplasm of overlapping sites of right female breast: Secondary | ICD-10-CM | POA: Diagnosis present

## 2023-05-13 DIAGNOSIS — Z87891 Personal history of nicotine dependence: Secondary | ICD-10-CM | POA: Insufficient documentation

## 2023-05-13 DIAGNOSIS — Z1722 Progesterone receptor negative status: Secondary | ICD-10-CM | POA: Diagnosis not present

## 2023-05-13 DIAGNOSIS — Z79899 Other long term (current) drug therapy: Secondary | ICD-10-CM | POA: Diagnosis not present

## 2023-05-13 LAB — COMPREHENSIVE METABOLIC PANEL
ALT: 20 U/L (ref 0–44)
AST: 23 U/L (ref 15–41)
Albumin: 3.9 g/dL (ref 3.5–5.0)
Alkaline Phosphatase: 108 U/L (ref 38–126)
Anion gap: 8 (ref 5–15)
BUN: 26 mg/dL — ABNORMAL HIGH (ref 8–23)
CO2: 25 mmol/L (ref 22–32)
Calcium: 8.9 mg/dL (ref 8.9–10.3)
Chloride: 101 mmol/L (ref 98–111)
Creatinine, Ser: 1.09 mg/dL — ABNORMAL HIGH (ref 0.44–1.00)
GFR, Estimated: 57 mL/min — ABNORMAL LOW (ref >60)
Glucose, Bld: 229 mg/dL — ABNORMAL HIGH (ref 70–99)
Potassium: 4.1 mmol/L (ref 3.5–5.1)
Sodium: 134 mmol/L — ABNORMAL LOW (ref 135–145)
Total Bilirubin: 0.9 mg/dL (ref 0.0–1.2)
Total Protein: 6.6 g/dL (ref 6.5–8.1)

## 2023-05-13 LAB — CBC WITH DIFFERENTIAL/PLATELET
Abs Immature Granulocytes: 0 10*3/uL (ref 0.00–0.07)
Basophils Absolute: 0 10*3/uL (ref 0.0–0.1)
Basophils Relative: 0 %
Eosinophils Absolute: 0.1 10*3/uL (ref 0.0–0.5)
Eosinophils Relative: 3 %
HCT: 27.9 % — ABNORMAL LOW (ref 36.0–46.0)
Hemoglobin: 9.6 g/dL — ABNORMAL LOW (ref 12.0–15.0)
Immature Granulocytes: 0 %
Lymphocytes Relative: 23 %
Lymphs Abs: 0.6 10*3/uL — ABNORMAL LOW (ref 0.7–4.0)
MCH: 39.2 pg — ABNORMAL HIGH (ref 26.0–34.0)
MCHC: 34.4 g/dL (ref 30.0–36.0)
MCV: 113.9 fL — ABNORMAL HIGH (ref 80.0–100.0)
Monocytes Absolute: 0.2 10*3/uL (ref 0.1–1.0)
Monocytes Relative: 8 %
Neutro Abs: 1.9 10*3/uL (ref 1.7–7.7)
Neutrophils Relative %: 66 %
Platelets: 118 10*3/uL — ABNORMAL LOW (ref 150–400)
RBC: 2.45 MIL/uL — ABNORMAL LOW (ref 3.87–5.11)
RDW: 16.2 % — ABNORMAL HIGH (ref 11.5–15.5)
WBC: 2.8 10*3/uL — ABNORMAL LOW (ref 4.0–10.5)
nRBC: 0 % (ref 0.0–0.2)

## 2023-05-13 LAB — MAGNESIUM: Magnesium: 2.1 mg/dL (ref 1.7–2.4)

## 2023-05-13 MED ORDER — HEPARIN SOD (PORK) LOCK FLUSH 100 UNIT/ML IV SOLN
500.0000 [IU] | Freq: Once | INTRAVENOUS | Status: AC | PRN
  Administered 2023-05-13: 500 [IU]

## 2023-05-13 MED ORDER — PEMBROLIZUMAB CHEMO INJECTION 100 MG/4ML
200.0000 mg | Freq: Once | INTRAVENOUS | Status: AC
  Administered 2023-05-13: 200 mg via INTRAVENOUS
  Filled 2023-05-13: qty 8

## 2023-05-13 MED ORDER — SODIUM CHLORIDE 0.9 % IV SOLN: Freq: Once | INTRAVENOUS | Status: AC

## 2023-05-13 MED ORDER — SODIUM CHLORIDE 0.9% FLUSH
10.0000 mL | Freq: Once | INTRAVENOUS | Status: AC
  Administered 2023-05-13: 10 mL via INTRAVENOUS

## 2023-05-13 NOTE — Progress Notes (Signed)
 Patient has been examined by Dr. Ellin Saba. Vital signs and labs have been reviewed by MD - ANC, Creatinine, LFTs, hemoglobin, and platelets are within treatment parameters per M.D. - pt may proceed with treatment.  Primary RN and pharmacy notified.

## 2023-05-13 NOTE — Patient Instructions (Signed)
 [facility] [place] - A DEPT OF [facility]  Discharge Instructions: Thank you for choosing [facility] to provide your oncology and hematology care.  If you have a lab appointment with the Cancer Center - please note that after April 8th, 2024, all labs will be drawn in the cancer center.  You do not have to check in or register with the main entrance as you have in the past but will complete your check-in in the cancer center.  Wear comfortable clothing and clothing appropriate for easy access to any Portacath or PICC line.   We strive to give you quality time with your provider. You may need to reschedule your appointment if you arrive late (15 or more minutes).  Arriving late affects you and other patients whose appointments are after yours.  Also, if you miss three or more appointments without notifying the office, you may be dismissed from the clinic at the provider's discretion.      For prescription refill requests, have your pharmacy contact our office and allow 72 hours for refills to be completed.    Today you received the following chemotherapy and/or immunotherapy agents keytruda     To help prevent nausea and vomiting after your treatment, we encourage you to take your nausea medication as directed.  BELOW ARE SYMPTOMS THAT SHOULD BE REPORTED IMMEDIATELY: *FEVER GREATER THAN 100.4 F (38 C) OR HIGHER *CHILLS OR SWEATING *NAUSEA AND VOMITING THAT IS NOT CONTROLLED WITH YOUR NAUSEA MEDICATION *UNUSUAL SHORTNESS OF BREATH *UNUSUAL BRUISING OR BLEEDING *URINARY PROBLEMS (pain or burning when urinating, or frequent urination) *BOWEL PROBLEMS (unusual diarrhea, constipation, pain near the anus) TENDERNESS IN MOUTH AND THROAT WITH OR WITHOUT PRESENCE OF ULCERS (sore throat, sores in mouth, or a toothache) UNUSUAL RASH, SWELLING OR PAIN  UNUSUAL VAGINAL DISCHARGE OR ITCHING   Items with * indicate a potential emergency and should be followed up as  soon as possible or go to the Emergency Department if any problems should occur.  Please show the CHEMOTHERAPY ALERT CARD or IMMUNOTHERAPY ALERT CARD at check-in to the Emergency Department and triage nurse.  Should you have questions after your visit or need to cancel or reschedule your appointment, please contact [facility] [place] - A DEPT OF [facility] [phone]  and follow the prompts.  Office hours are 8:00 a.m. to 4:30 p.m. Monday - Friday. Please note that voicemails left after 4:00 p.m. may not be returned until the following business day.  We are closed weekends and major holidays. You have access to a nurse at all times for urgent questions. Please call the main number to the clinic [phone] and follow the prompts.  For any non-urgent questions, you may also contact your provider using MyChart. We now offer e-Visits for anyone 47 and older to request care online for non-urgent symptoms. For details visit [email or web address].   Also download the MyChart app! Go to the app store, search MyChart, open the app, select  [facility], and log in with your MyChart username and password.

## 2023-05-13 NOTE — Telephone Encounter (Signed)
Called patient to schedule a FUN visit w. Alison. No answer, LVM for a return call.  

## 2023-05-13 NOTE — Progress Notes (Signed)
 Patient tolerated chemotherapy with no complaints voiced.  Side effects with management reviewed with understanding verbalized.  Port site clean and dry with no bruising or swelling noted at site.  Good blood return noted before and after administration of chemotherapy.  Band aid applied.  Patient left in satisfactory condition with VSS and no s/s of distress noted. All follow ups as scheduled.   Venkat Ankney [employer]

## 2023-05-13 NOTE — Patient Instructions (Signed)

## 2023-05-13 NOTE — Progress Notes (Signed)
 [facility] [address] [place] [address]    Clinic Day:  05/16/2023  Referring physician: [name], MD  Patient Care Team: [name], MD as PCP - General (Family Medicine) [name], MD (Family Medicine) Billee Mliss BIRCH, [facility] as Pharmacist (Family Medicine) Eartha Flavors, Toribio, MD as Consulting Physician (Gastroenterology) Mavis Anes, MD as Consulting Physician (General Surgery) Glean Stephane BROCKS, RN as Oncology Nurse Navigator Tyree Nanetta SAILOR, RN as Oncology Nurse Navigator Rogers Hai, MD as Medical Oncologist (Medical Oncology) Celestia Joesph SQUIBB, RN as Oncology Nurse Navigator (Medical Oncology) Rogers Hai, MD as Consulting Physician (Hematology) [name], MD as Referring Physician (Family Medicine)   ASSESSMENT & PLAN:   Assessment: 1.  Inflammatory right breast cancer: - Bilateral diagnostic mammogram (02/20/2022): Suspicious right axillary lymphadenopathy.  Indeterminate intramammary lymph node in the right breast at 9:00.  New right breast skin and trabecular thickening, related to vascular congestion from enlarged lymph nodes in the right axilla. - Right axillary lymph node core biopsy (02/20/2022): Morphology and IHC compatible with primary breast cancer with differential diagnosis of urothelial carcinoma and primary lung carcinoma (squamous).  Grade 3.  ER 0%, PR 0%, HER2 2+, Ki-67 60%, HER2 negative by FISH. - Right breast lymph node biopsy at 9:00 (02/20/2022): Negative for carcinoma. - MRI breast (03/02/2022): In the upper outer right breast, mid to posterior depth there is a patchy clumped non-mass enhancement in a linear orientation spanning approximately 3.8 cm.  There is diffuse thickening of the skin in the right breast with skin enhancement.  Left breast with no mass or abnormal enhancement.  Numerous bulky matted lymph nodes in the right axilla. - Right breast UOQ biopsy (03/19/2022): Benign  breast with fibrocystic changes including stromal fibrosis, adenosis, usual ductal hyperplasia.  Negative for carcinoma. - PET scan (03/22/2022): Bulky hypermetabolic right axillary and subpectoral lymphadenopathy.  No other definite new sites of metastatic disease.  6 mm right lower lobe lung nodule with no FDG uptake. - Neoadjuvant chemotherapy with weekly paclitaxel  and pembrolizumab  from 04/02/2022 through 06/21/2022,  AC and pembrolizumab  from 07/12/2022 through 09/27/2022 - Right mastectomy and lymph node excision (11/01/2022): YpT0, YPN1A, 2/3 lymph nodes positive for metastatic disease, no ECE.  Surgical margins negative. - Germline mutation testing: Negative - Adjuvant pembrolizumab  and Xeloda  (CREATE-X) started on 12/05/2022, Xeloda  dose reduced to 2 tablets twice daily with cycle 2 due to mucositis and feet hurting with skin peeling.    Plan: 1.  Triple negative right breast cancer: - She is tolerating Xeloda  reasonably well. - Last cycle of Xeloda  2 tablets twice daily 2 weeks on/1 week of started on 05/09/2023. - Reviewed labs today: WBC 2.8 with normal ANC.  Platelet count 118.  LFTs are normal.  Creatinine 1.09. - This will be her last cycle of Xeloda .  She will receive her Keytruda  today.  Last Keytruda  will be on 06/03/2023. - She will follow-up with Dr. Dewey for radiation therapy. - We will set up follow-up in 3 months with repeat labs.   2.  Hypomagnesemia: - She is taking magnesium  twice daily.  Magnesium  is 2.1 today.  Cut back on magnesium  to once daily.   3.  Mild leukopenia and thrombocytopenia: - She has mild leukopenia and thrombocytopenia with ANC of 1.9.  This is from myelosuppression from Xeloda .  Closely monitor.    Orders Placed This Encounter  Procedures   Magnesium     Standing Status:   Future    Number of Occurrences:  1    Expected Date:   05/13/2023    Expiration Date:   05/12/2024   Magnesium     Standing Status:   Future    Expected Date:   06/03/2023     Expiration Date:   06/02/2024   CBC with Differential    Standing Status:   Future    Expected Date:   08/05/2023    Expiration Date:   05/12/2024   Comprehensive metabolic panel    Standing Status:   Future    Expected Date:   08/05/2023    Expiration Date:   05/12/2024   Magnesium     Standing Status:   Future    Expected Date:   08/05/2023    Expiration Date:   05/12/2024   Cancer antigen 15-3    Standing Status:   Future    Expected Date:   08/05/2023    Expiration Date:   05/12/2024   Cancer antigen 27.29    Standing Status:   Future    Expected Date:   08/05/2023    Expiration Date:   05/12/2024      I,Katie Daubenspeck,acting as a scribe for Alean Stands, MD.,have documented all relevant documentation on the behalf of Alean Stands, MD,as directed by  Alean Stands, MD while in the presence of Alean Stands, MD.   I, Alean Stands MD, have reviewed the above documentation for accuracy and completeness, and I agree with the above.   Alean Stands, MD   1/9/20256:44 PM  CHIEF COMPLAINT:   Diagnosis: locally advanced TNBC    Cancer Staging  Malignant neoplasm of overlapping sites of right female breast [facility]) Staging form: Breast, AJCC 8th Edition - Clinical stage from 03/08/2022: Stage IIIC (cT4d, cN2, cM0, G3, ER-, PR-, HER2-) - Signed by Lanny Callander, MD on 03/08/2022    Prior Therapy: 1. carboplatin  and paclitaxel , 4 cycles, 04/02/22 - 06/06/22 2. Pembrolizumab , 04/02/22 - 09/27/22 3. Adriamycin  and Cytoxan , 4 cycles, 07/12/22 - 09/27/22 4. Right mastectomy, 11/01/22  Current Therapy:  Capecitabine  and pembrolizumab     HISTORY OF PRESENT ILLNESS:   Oncology History Overview Note   Cancer Staging  Malignant neoplasm of overlapping sites of right female breast [facility]) Staging form: Breast, AJCC 8th Edition - Clinical stage from 03/08/2022: Stage IIIC (cT2, cN2, cM0, G3, ER-, PR-, HER2-) - Signed by Lanny Callander, MD on 03/08/2022    Malignant  neoplasm of overlapping sites of right female breast [facility])  02/20/2022 Mammogram   CLINICAL DATA:  63 year old female recalled from screening mammography 03/01/2021 for right breast calcifications and subsequent benign discordant biopsy of these calcifications in the central posterior right breast December 2022 with excision recommended. The patient initially followed up with surgery in April however canceled her scheduled surgery and most recently followed up with Dr. Kallie September 2023 with diagnostic imaging, possible RF tag placement and subsequent excision recommended.   EXAM: DIGITAL DIAGNOSTIC BILATERAL MAMMOGRAM WITH TOMOSYNTHESIS; ULTRASOUND RIGHT BREAST LIMITED  MPRESSION: 1.  Suspicious right axillary lymphadenopathy.   2. Indeterminate intramammary lymph node in the right breast at 9 o'clock.   3. 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   4. Decreased calcifications noted at prior benign biopsy site in the lower central posterior right breast at site of X shaped biopsy marking clip.   02/20/2022 Initial Biopsy   FINAL MICROSCOPIC DIAGNOSIS:   A. AXILLA, RIGHT, LYMPH NODE,  NEEDLE CORE BIOPSY:  - Positive for carcinoma (see Comment)   B. LYMPH NODE, RIGHT BREAST, BIOPSY:  - Negative for carcinoma   COMMENT:  Part A: Morphology and immunohistochemical staining are most compatible with primary breast carcinoma with metaplastic changes, however differential diagnosis also includes urothelial carcinoma and less likely primary lung carcinoma (squamous).  No lymphoid tissue is identified.  Clinical and radiologic correlation is suggested.   ADDENDUM:  In case of a breast origin, the appropriate grade would be grade 3  (3+3+2)   ADDENDUM:  PROGNOSTIC INDICATOR RESULTS:  The tumor cells are EQUIVOCAL for Her2 (2+).   Estrogen Receptor:       0%, NEGATIVE  Progesterone Receptor:   0%, NEGATIVE  Proliferation Marker Ki-67:   60%   ADDENDUM:  FLOURESCENCE IN-SITU HYBRIDIZATION RESULTS:  GROUP 5:   HER2 **NEGATIVE**    03/02/2022 Imaging   EXAM: BILATERAL BREAST MRI WITH AND WITHOUT CONTRAST  IMPRESSION: 1. There is a suspicious 3.8 cm area of patchy non mass enhancement in a linear orientation in the slightly upper outer right breast in the mid to posterior depth spanning 3.8 cm.   2. Diffuse skin thickening with enhancement of the skin, concerning for inflammatory breast cancer.   3. Numerous bulky matted lymph nodes in the right axilla, one of which corresponds with the biopsy-proven metastatic lymph node.   4.  No evidence of left breast malignancy.   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)   03/08/2022 Cancer Staging   Staging form: Breast, AJCC 8th Edition - Clinical stage from 03/08/2022: Stage IIIC (cT4d, cN2, cM0, G3, ER-, PR-, HER2-) - Signed by Lanny Callander, MD on 03/08/2022 Histologic grading system: 3 grade system   03/19/2022 Pathology Results   Diagnosis Breast, right, needle core biopsy, upper outer quadrant, barbell clip BENIGN BREAST WITH FIBROCYSTIC CHANGES INCLUDING STROMAL FIBROSIS, ADENOSIS AND USUAL DUCT HYPERPLASIA BENIGN FIBROMATOID CHANGE NEGATIVE FOR MICROCALCIFICATIONS NEGATIVE FOR CARCINOMA   03/22/2022 PET scan   IMPRESSION: Bulky hypermetabolic right axillary and subpectoral lymphadenopathy, consistent with metastatic disease.   No other definite sites of metastatic disease identified.   6 mm right lower lobe pulmonary nodule shows no FDG uptake, but is too small to definitively characterize by PET. Recommend continued follow-up by chest CT in 3-4 months.   Aortic Atherosclerosis (ICD10-I70.0).   04/02/2022 - 09/28/2022 Chemotherapy   Patient is on Treatment Plan : BREAST Pembrolizumab  (200) D1 +  Carboplatin  (5) D1 + Paclitaxel  (80) D1,8,15 q21d X 4 cycles / Pembrolizumab  (200) D1 + AC D1 q21d x 4 cycles      Genetic Testing   Negative genetic testing. No pathogenic variants identified on the [facility] Common Hereditary Cancers+RNA panel. The report date is 05/31/2022.  The Common Hereditary Cancers Panel + RNA offered by [facility] includes sequencing and/or deletion duplication testing of the following 48 genes: APC*, ATM*, AXIN2, BAP1, BARD1, BMPR1A, BRCA1, BRCA2, BRIP1, CDH1, CDK4, CDKN2A (p14ARF), CDKN2A (p16INK4a), CHEK2, CTNNA1, DICER1*, EPCAM*, FH*, GREM1*, HOXB13, KIT, MBD4, MEN1*, MLH1*, MSH2*, MSH3*, MSH6*, MUTYH, NF1*, NTHL1, PALB2, PDGFRA, PMS2*, POLD1*, POLE, PTEN*, RAD51C, RAD51D, SDHA*, SDHB, SDHC*, SDHD, SMAD4, SMARCA4, STK11, TP53, TSC1*, TSC2, VHL.    12/12/2022 -  Chemotherapy   Patient is on Treatment Plan : BREAST Pembrolizumab  (200) q21d x 27 weeks        INTERVAL HISTORY:   Marilyn Ford is a 63 y.o. female presenting to clinic today for follow up of locally advanced TNBC.  She was last seen by NP Delon on 03/28/23.  Today, she states that she is doing well overall. Her appetite level is at 100%. Her energy level is at 80%.  PAST MEDICAL HISTORY:   Past Medical History: Past Medical History:  Diagnosis Date   Allergy    Anxiety    Breast cancer (HCC)    Diabetes mellitus without complication (HCC)    GERD (gastroesophageal reflux disease)    Heart murmur    as a child   History of kidney stones    Hypertension    Pneumonia    Stroke [facility])    Vaginal Pap smear, abnormal     Surgical History: Past Surgical History:  Procedure Laterality Date   BREAST BIOPSY  10/31/2022   US  RT RADIOACTIVE SEED LOC 10/31/2022 [facility] MAMMOGRAPHY   CESAREAN SECTION     CHOLECYSTECTOMY     COLONOSCOPY WITH PROPOFOL  N/A 07/19/2020   Procedure: COLONOSCOPY WITH PROPOFOL ;  Surgeon: Eartha Angelia Sieving, MD;  Location: [facility];  Service: Gastroenterology;  Laterality: N/A;   AM   POLYPECTOMY  07/19/2020   Procedure: POLYPECTOMY;  Surgeon: Eartha Angelia, Sieving, MD;  Location: [facility];  Service: Gastroenterology;;   PORTACATH PLACEMENT N/A 03/20/2022   Procedure: INSERTION PORT-A-CATH;  Surgeon: Vernetta Berg, MD;  Location: [facility];  Service: General;  Laterality: N/A;   RADIOACTIVE SEED GUIDED AXILLARY SENTINEL LYMPH NODE Right 11/01/2022   Procedure: RADIOACTIVE SEED GUIDED RIGHT AXILLARY SENTINEL LYMPH NODE DISSECTION;  Surgeon: Vernetta Berg, MD;  Location: [facility];  Service: General;  Laterality: Right;   SIMPLE MASTECTOMY WITH AXILLARY SENTINEL NODE BIOPSY Right 11/01/2022   Procedure: RIGHT SIMPLE MASTECTOMY;  Surgeon: Vernetta Berg, MD;  Location:  SURGERY CENTER;  Service: General;  Laterality: Right;    Social History: Social History   Socioeconomic History   Marital status: Married    Spouse name: Lynwood   Number of children: 2   Years of education: 10   Highest education level: 10th grade  Occupational History   Occupation: disabled  Tobacco Use   Smoking status: Former    Current packs/day: 0.00    Average packs/day: 0.5 packs/day for 25.0 years (12.5 ttl pk-yrs)    Types: Cigarettes    Start date: 12/08/1999    Quit date: 03/12/2015    Years since quitting: 8.1   Smokeless tobacco: Never  Vaping Use   Vaping status: Never Used  Substance and Sexual Activity   Alcohol use: Yes    Alcohol/week: 2.0 - 3.0 standard drinks of alcohol    Types: 2 - 3 Glasses of wine per week   Drug use: No   Sexual activity: Not Currently    Birth control/protection: Abstinence, Post-menopausal  Other Topics Concern   Not on file  Social History Narrative   Volunteers at [employer] for a few hours every day.    She really enjoys getting out of of the house and working there.    Social Drivers of Corporate Investment Banker Strain: Low Risk  (03/17/2023)   Overall Financial Resource Strain  (CARDIA)    Difficulty of Paying Living Expenses: Not very hard  Food Insecurity: No Food Insecurity (03/17/2023)   Hunger Vital Sign    Worried About Running Out of Food in the Last Year: Never true    Ran Out of Food in the Last Year: Never true  Transportation Needs: No Transportation Needs (03/17/2023)   PRAPARE - Transportation  Lack of Transportation (Medical): No    Lack of Transportation (Non-Medical): No  Physical Activity: Insufficiently Active (03/17/2023)   Exercise Vital Sign    Days of Exercise per Week: 4 days    Minutes of Exercise per Session: 10 min  Stress: No Stress Concern Present (03/17/2023)   [employer] of Occupational Health - Occupational Stress Questionnaire    Feeling of Stress : Only a little  Social Connections: Moderately Isolated (03/17/2023)   Social Connection and Isolation Panel [NHANES]    Frequency of Communication with Friends and Family: More than three times a week    Frequency of Social Gatherings with Friends and Family: Twice a week    Attends Religious Services: Never    Database Administrator or Organizations: No    Attends Banker Meetings: Never    Marital Status: Married  Catering Manager Violence: Not At Risk (12/13/2022)   Humiliation, Afraid, Rape, and Kick questionnaire    Fear of Current or Ex-Partner: No    Emotionally Abused: No    Physically Abused: No    Sexually Abused: No    Family History: Family History  Problem Relation Age of Onset   Diabetes Mother    Uterine cancer Mother 32 - 52   Diabetes Brother    Breast cancer Maternal Aunt        dx >50, d. from cancer   Cancer Maternal Grandmother        unk type, back cancer?    Current Medications:  Current Outpatient Medications:    Alcohol Swabs (B-D SINGLE USE SWABS REGULAR) PADS, Test BS daily and as needed Dx E11.9, Disp: 100 each, Rfl: 3   aluminum -magnesium  hydroxide-simethicone  (MAALOX) 200-200-20 MG/5ML SUSP, Take 30 mLs by mouth 4  (four) times daily -  before meals and at bedtime., Disp: 480 mL, Rfl: 2   amLODipine  (NORVASC ) 10 MG tablet, 1 tablet daily, Disp: 90 tablet, Rfl: 3   Apple Cider Vinegar 500 MG TABS, Take 500 mg by mouth in the morning., Disp: , Rfl:    aspirin  81 MG EC tablet, Take 1 tablet (81 mg total) by mouth daily., Disp: 30 tablet, Rfl: 0   atorvastatin  (LIPITOR ) 80 MG tablet, TAKE 1 TABLET EVERY DAY AT 6PM, Disp: 90 tablet, Rfl: 3   betamethasone  dipropionate 0.05 % cream, Apply topically 2 (two) times daily., Disp: 30 g, Rfl: 0   Blood Glucose Calibration (TRUE METRIX LEVEL 1) Low SOLN, Use with glucometer Dx E11.9, Disp: 3 each, Rfl: 0   Blood Glucose Monitoring Suppl (TRUE METRIX AIR GLUCOSE METER) w/Device KIT, Test BS daily and as needed Dx E11.9, Disp: 1 kit, Rfl: 0   Calcium  Carb-Cholecalciferol (CALCIUM -VITAMIN D) 600-400 MG-UNIT TABS, Take 1 tablet by mouth in the morning., Disp: , Rfl:    capecitabine  (XELODA ) 500 MG tablet, Take 2 tablets (1,000 mg total) by mouth 2 (two) times daily after a meal. Take for 14 days, then hold for 7 days. Repeat every 21 days., Disp: 56 tablet, Rfl: 5   cetirizine  (ZYRTEC ) 10 MG tablet, Take 1 tablet (10 mg total) by mouth daily., Disp: 30 tablet, Rfl: 3   Coenzyme Q10 (COQ10) 100 MG CAPS, Take 100 mg by mouth in the morning., Disp: , Rfl:    Cranberry 425 MG CAPS, Take 425 mg by mouth in the morning and at bedtime., Disp: , Rfl:    diphenoxylate -atropine  (LOMOTIL ) 2.5-0.025 MG tablet, Take 1 tablet by mouth 4 (four) times daily as needed  for diarrhea or loose stools., Disp: 30 tablet, Rfl: 0   escitalopram  (LEXAPRO ) 10 MG tablet, Take 1 tablet (10 mg total) by mouth daily., Disp: 90 tablet, Rfl: 1   Flaxseed, Linseed, (FLAXSEED OIL) 1000 MG CAPS, Take 1,000 mg by mouth in the morning., Disp: , Rfl:    gabapentin  (NEURONTIN ) 300 MG capsule, Take 1 capsule (300 mg total) by mouth 2 (two) times daily., Disp: 60 capsule, Rfl: 0   glucose blood (TRUE METRIX BLOOD  GLUCOSE TEST) test strip, Test BS daily and as needed Dx E11.9, Disp: 100 each, Rfl: 3   icosapent  Ethyl (VASCEPA ) 1 g capsule, Take 2 g by mouth 2 (two) times daily., Disp: , Rfl:    Krill Oil 500 MG CAPS, Take 3 capsules (1,500 mg total) by mouth in the morning and at bedtime., Disp: , Rfl:    lidocaine  (XYLOCAINE ) 2 % solution, Use as directed 15 mLs in the mouth or throat every 6 (six) hours as needed for mouth pain., Disp: 250 mL, Rfl: 1   LORazepam  (ATIVAN ) 1 MG tablet, Take 1 tablet (1 mg total) by mouth 2 (two) times daily as needed for anxiety., Disp: 10 tablet, Rfl: 0   magnesium  oxide (MAG-OX) 400 (240 Mg) MG tablet, TAKE 1 TABLET BY MOUTH TWICE A DAY, Disp: 60 tablet, Rfl: 1   meloxicam  (MOBIC ) 15 MG tablet, TAKE 1 TABLET (15 MG TOTAL) BY MOUTH DAILY FOR JOINT AND MUSCLE PAIN, Disp: 90 tablet, Rfl: 0   metoprolol  (TOPROL -XL) 200 MG 24 hr tablet, TAKE 1 TABLET ONE TIME DAILY, WITH OR IMMEDIATELY FOLLOWING A MEAL, Disp: 90 tablet, Rfl: 3   Multiple Vitamin (MULTIVITAMIN) capsule, Take 1 capsule by mouth in the morning., Disp: , Rfl:    ondansetron  (ZOFRAN ) 8 MG tablet, Take 1 tablet (8 mg total) by mouth every 8 (eight) hours as needed for nausea or vomiting., Disp: 30 tablet, Rfl: 3   pantoprazole  (PROTONIX ) 40 MG tablet, TAKE 1 TABLET twice daily FOR STOMACH, Disp: 180 tablet, Rfl: 3   traZODone  (DESYREL ) 150 MG tablet, TAKE 1 OR 2 TABLETS AT BEDTIME FOR SLEEP, Disp: 180 tablet, Rfl: 3   TRUEplus Lancets 33G MISC, Test BS daily and as needed Dx E11.9, Disp: 100 each, Rfl: 3   valsartan  (DIOVAN ) 320 MG tablet, Take 1 tablet (320 mg total) by mouth daily. For blood pressure., Disp: 90 tablet, Rfl: 3   zinc  gluconate 50 MG tablet, Take 50 mg by mouth daily., Disp: , Rfl:    Zinc  Oxide 20 % PSTE, Apply 1 Application topically 3 (three) times daily., Disp: 57 g, Rfl: 1   lidocaine -prilocaine  (EMLA ) cream, Apply 1 Application topically as needed., Disp: 30 g, Rfl: 0   Allergies: Allergies   Allergen Reactions   Penicillins Shortness Of Breath   Sulfa Antibiotics Rash    REVIEW OF SYSTEMS:   Review of Systems  Constitutional:  Negative for chills, fatigue and fever.  HENT:   Negative for lump/mass, mouth sores, nosebleeds, sore throat and trouble swallowing.   Eyes:  Negative for eye problems.  Respiratory:  Negative for cough and shortness of breath.   Cardiovascular:  Negative for chest pain, leg swelling and palpitations.  Gastrointestinal:  Positive for constipation and diarrhea. Negative for abdominal pain, nausea and vomiting.  Genitourinary:  Negative for bladder incontinence, difficulty urinating, dysuria, frequency, hematuria and nocturia.   Musculoskeletal:  Negative for arthralgias, back pain, flank pain, myalgias and neck pain.  Skin:  Negative for itching and  rash.  Neurological:  Positive for numbness. Negative for dizziness and headaches.  Hematological:  Does not bruise/bleed easily.  Psychiatric/Behavioral:  Negative for depression, sleep disturbance and suicidal ideas. The patient is not nervous/anxious.   All other systems reviewed and are negative.    VITALS:   There were no vitals taken for this visit.  Wt Readings from Last 3 Encounters:  05/16/23 148 lb (67.1 kg)  05/16/23 148 lb 3.2 oz (67.2 kg)  05/13/23 148 lb (67.1 kg)    There is no height or weight on file to calculate BMI.  Performance status (ECOG): 1 - Symptomatic but completely ambulatory  PHYSICAL EXAM:   Physical Exam Vitals and nursing note reviewed. Exam conducted with a chaperone present.  Constitutional:      Appearance: Normal appearance.  Cardiovascular:     Rate and Rhythm: Normal rate and regular rhythm.     Pulses: Normal pulses.     Heart sounds: Normal heart sounds.  Pulmonary:     Effort: Pulmonary effort is normal.     Breath sounds: Normal breath sounds.  Abdominal:     Palpations: Abdomen is soft. There is no hepatomegaly, splenomegaly or mass.      Tenderness: There is no abdominal tenderness.  Musculoskeletal:     Right lower leg: No edema.     Left lower leg: No edema.  Lymphadenopathy:     Cervical: No cervical adenopathy.     Right cervical: No superficial, deep or posterior cervical adenopathy.    Left cervical: No superficial, deep or posterior cervical adenopathy.     Upper Body:     Right upper body: No supraclavicular or axillary adenopathy.     Left upper body: No supraclavicular or axillary adenopathy.  Neurological:     General: No focal deficit present.     Mental Status: She is alert and oriented to person, place, and time.  Psychiatric:        Mood and Affect: Mood normal.        Behavior: Behavior normal.     LABS:   CBC     Component Value Date/Time   WBC 2.8 (L) 05/13/2023 1041   RBC 2.45 (L) 05/13/2023 1041   HGB 9.6 (L) 05/13/2023 1041   HGB 8.8 (LL) 03/20/2023 1013   HCT 27.9 (L) 05/13/2023 1041   HCT 26.0 (L) 03/20/2023 1013   PLT 118 (L) 05/13/2023 1041   PLT 163 03/20/2023 1013   MCV 113.9 (H) 05/13/2023 1041   MCV 116 (H) 03/20/2023 1013   MCH 39.2 (H) 05/13/2023 1041   MCHC 34.4 05/13/2023 1041   RDW 16.2 (H) 05/13/2023 1041   RDW 15.7 (H) 03/20/2023 1013   LYMPHSABS 0.6 (L) 05/13/2023 1041   LYMPHSABS 0.6 (L) 03/20/2023 1013   MONOABS 0.2 05/13/2023 1041   EOSABS 0.1 05/13/2023 1041   EOSABS 0.1 03/20/2023 1013   BASOSABS 0.0 05/13/2023 1041   BASOSABS 0.0 03/20/2023 1013    CMP      Component Value Date/Time   NA 134 (L) 05/13/2023 1041   NA 143 03/20/2023 1013   K 4.1 05/13/2023 1041   CL 101 05/13/2023 1041   CO2 25 05/13/2023 1041   GLUCOSE 229 (H) 05/13/2023 1041   BUN 26 (H) 05/13/2023 1041   BUN 20 03/20/2023 1013   CREATININE 1.09 (H) 05/13/2023 1041   CREATININE 0.97 04/02/2022 0924   CALCIUM  8.9 05/13/2023 1041   PROT 6.6 05/13/2023 1041   PROT 6.8 03/20/2023 1013  ALBUMIN 3.9 05/13/2023 1041   ALBUMIN 4.4 03/20/2023 1013   AST 23 05/13/2023 1041   AST 23  04/02/2022 0924   ALT 20 05/13/2023 1041   ALT 26 04/02/2022 0924   ALKPHOS 108 05/13/2023 1041   BILITOT 0.9 05/13/2023 1041   BILITOT 1.0 03/20/2023 1013   BILITOT 0.6 04/02/2022 0924   GFRNONAA 57 (L) 05/13/2023 1041   GFRNONAA >60 04/02/2022 0924   GFRAA 66 05/02/2020 1024     No results found for: CEA1, CEA / No results found for: CEA1, CEA No results found for: PSA1 No results found for: CAN199 No results found for: CAN125  No results found for: TOTALPROTELP, ALBUMINELP, A1GS, A2GS, BETS, BETA2SER, GAMS, MSPIKE, SPEI Lab Results  Component Value Date   TIBC 350 02/14/2023   FERRITIN 599 (H) 02/14/2023   IRONPCTSAT 39 (H) 02/14/2023   No results found for: LDH   STUDIES:   No results found.

## 2023-05-14 MED ORDER — LIDOCAINE-PRILOCAINE 2.5-2.5 % EX CREA: 1.0000 | TOPICAL_CREAM | CUTANEOUS | 0 refills | Status: AC | PRN

## 2023-05-14 NOTE — Addendum Note (Signed)
 Addended by: Bertrum Sol on: 05/14/2023 08:28 AM   Modules accepted: Orders

## 2023-05-16 ENCOUNTER — Encounter: Payer: Self-pay | Admitting: Radiation Oncology

## 2023-05-16 ENCOUNTER — Other Ambulatory Visit (HOSPITAL_COMMUNITY): Payer: Self-pay | Admitting: Pharmacy Technician

## 2023-05-16 ENCOUNTER — Other Ambulatory Visit: Payer: Self-pay

## 2023-05-16 ENCOUNTER — Ambulatory Visit
Admission: RE | Admit: 2023-05-16 | Discharge: 2023-05-16 | Disposition: A | Discharge status: Home / Self Care | Payer: No Typology Code available for payment source | Source: Ambulatory Visit | Attending: Radiation Oncology | Admitting: Radiation Oncology

## 2023-05-16 ENCOUNTER — Encounter: Payer: Self-pay | Admitting: Hematology

## 2023-05-16 ENCOUNTER — Other Ambulatory Visit (HOSPITAL_COMMUNITY): Payer: Self-pay

## 2023-05-16 VITALS — BP 147/67 | HR 72 | Temp 98.1°F | Resp 19 | Ht 62.0 in | Wt 148.0 lb

## 2023-05-16 VITALS — BP 147/67 | HR 72 | Temp 98.1°F | Resp 18 | Wt 148.2 lb

## 2023-05-16 DIAGNOSIS — I10 Essential (primary) hypertension: Secondary | ICD-10-CM | POA: Diagnosis not present

## 2023-05-16 DIAGNOSIS — Z87442 Personal history of urinary calculi: Secondary | ICD-10-CM | POA: Insufficient documentation

## 2023-05-16 DIAGNOSIS — R011 Cardiac murmur, unspecified: Secondary | ICD-10-CM | POA: Diagnosis not present

## 2023-05-16 DIAGNOSIS — C50811 Malignant neoplasm of overlapping sites of right female breast: Secondary | ICD-10-CM | POA: Insufficient documentation

## 2023-05-16 DIAGNOSIS — Z87891 Personal history of nicotine dependence: Secondary | ICD-10-CM | POA: Diagnosis not present

## 2023-05-16 DIAGNOSIS — Z7982 Long term (current) use of aspirin: Secondary | ICD-10-CM | POA: Insufficient documentation

## 2023-05-16 DIAGNOSIS — Z171 Estrogen receptor negative status [ER-]: Secondary | ICD-10-CM

## 2023-05-16 DIAGNOSIS — Z803 Family history of malignant neoplasm of breast: Secondary | ICD-10-CM | POA: Insufficient documentation

## 2023-05-16 DIAGNOSIS — Z79899 Other long term (current) drug therapy: Secondary | ICD-10-CM | POA: Insufficient documentation

## 2023-05-16 DIAGNOSIS — Z8041 Family history of malignant neoplasm of ovary: Secondary | ICD-10-CM | POA: Diagnosis not present

## 2023-05-16 DIAGNOSIS — Z809 Family history of malignant neoplasm, unspecified: Secondary | ICD-10-CM | POA: Diagnosis not present

## 2023-05-16 DIAGNOSIS — E119 Type 2 diabetes mellitus without complications: Secondary | ICD-10-CM | POA: Insufficient documentation

## 2023-05-16 DIAGNOSIS — Z8673 Personal history of transient ischemic attack (TIA), and cerebral infarction without residual deficits: Secondary | ICD-10-CM | POA: Diagnosis not present

## 2023-05-16 DIAGNOSIS — K219 Gastro-esophageal reflux disease without esophagitis: Secondary | ICD-10-CM | POA: Insufficient documentation

## 2023-05-16 DIAGNOSIS — Z510 Encounter for antineoplastic radiation therapy: Secondary | ICD-10-CM | POA: Diagnosis not present

## 2023-05-16 NOTE — Progress Notes (Signed)
 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   HISTORY OF PRESENT ILLNESS: Marilyn Ford is a 63 y.o. female seen for a diagnosis of right breast cancer. The patient was being followed for calcifications in the right breast.  She had a biopsy in October 2022 that showed benign calcifications though this was felt to be discordant.  She was offered surgery but rather has been followed with diagnostic imaging.  A diagnostic mammogram on 02/20/2022 showed suspicious changes of the skin of the right breast with asymmetry in the central right breast.  By ultrasound the skin thickening and subcutaneous edema throughout the right breast was seen and 1 cm intramammary lymph node with nodular cortical thickening was seen in the 9:00 right breast.  This corresponded to the mass seen on mammography.  The entire upper and lower central right breast was scanned and no definitive masses or other sonographic abnormalities were seen.  Targeted ultrasound of the right axilla showed at least 3 morphologically abnormal appearing lymph nodes with massive cortical thickening.  She underwent biopsy that same day, and while the intramammary lymph node  was negative for carcinoma, her right axillary node that was sampled was a grade 3 carcinoma consistent with breast primary.  Her prognostic panel is pending. She did have an MRI of the breasts performed on 03/02/2022 this did show a 3.8 cm area of clumped non-mass enhancement with evidence of biopsy marking clip just below the level of this there was diffuse skin thickening of the right breast  with skin enhancement.  Numerous bulky matted lymph nodes in the right axilla were also identified.  No abnormalities were seen in the left breast.  A punch biopsy on 03/19/22 showed benign breast tissue with fibrocystic change, stromal fibrosis adenosis and usual ductal hyperplasia.  No malignancy was identified.  A PET scan on 03/22/2022 showed hypermetabolic right axillary and subpectoral lymphadenopathy with no other definite signs of disease.  She was counseled on the rationale for directly proceeding with neoadjuvant chemotherapy and Keytruda  which began on 04/02/2022 and concluded on 09/27/2022.  She underwent a right mastectomy with right ALND on 11/01/2022 which showed fibrosis of the right breast with mild inflammation focal atypical ductal hyperplasia negative for residual invasive cancer 1 lymph node in the specimen was negative and all surgical margins were negative.  6 sampled lymph nodes in total were reviewed 1 contained metastatic disease greater than 2 mm and 1 showed micrometastasis.  She began oral capecitabine  on 12/04/2022 which she continues and also began Keytruda  on 12/12/22. She was seen back in August in our clinic and was interested in adjuvant radiotherapy to the right chest wall and regional lymph nodes. She was referred to [facility] but her insurance did not cover treatment at that [facility] facility. She was contacted again to schedule simulation, but this was cancelled. She is now back to see us  again to consider radiation in our facility. She will finish her consolidative immunotherapy and oral xeloda  at the end of the month.   PREVIOUS RADIATION THERAPY: No   PAST MEDICAL HISTORY:  Past Medical  History:  Diagnosis Date   Allergy    Anxiety    Breast cancer (HCC)    Diabetes mellitus without complication (HCC)    GERD (gastroesophageal reflux disease)    Heart murmur    as a child   History of kidney stones    Hypertension    Pneumonia    Stroke [facility])    Vaginal Pap smear,  abnormal        PAST SURGICAL HISTORY: Past Surgical History:  Procedure Laterality Date   BREAST BIOPSY  10/31/2022   US  RT RADIOACTIVE SEED LOC 10/31/2022 [facility] MAMMO[facility]AP[facility]   CESAREAN SECTION     CHOLECYSTECTOMY     COLONOSCOPY WITH PROPOFOL  N/A 07/19/2020   Procedure: COLONOSCOPY WITH PROPOFOL ;  Surgeon: Eartha Angelia Sieving, MD;  [facility];  Service: Gastroenterology;  Laterality: N/A;  AM   POLYPECTOMY  07/19/2020   Procedure: POLYPECTOMY;  Surgeon: Eartha Angelia Sieving, MD;  Location: [facility];  Service: Gastroenterology;;   PORTACATH PLACEMENT N/A 03/20/2022   Procedure: INSERTION PORT-A-CATH;  Surgeon: Vernetta Berg, MD;  Location: [facility];  Service: General;  Laterality: N/A;   RADIOACTIVE SEED GUIDED AXILLARY SENTINEL LYMPH NODE Right 11/01/2022   Procedure: RADIOACTIVE SEED GUIDED RIGHT AXILLARY SENTINEL LYMPH NODE DISSECTION;  Surgeon: Vernetta Berg, MD;  Location: [facility];  Service: General;  Laterality: Right;   SIMPLE MASTECTOMY WITH AXILLARY SENTINEL NODE BIOPSY Right 11/01/2022   Procedure: RIGHT SIMPLE MASTECTOMY;  Surgeon: Vernetta Berg, MD;  Location: [facility];  Service: General;  Laterality: Right;     FAMILY HISTORY:  Family History  Problem Relation Age of Onset   Diabetes Mother    Uterine cancer Mother 1 - 52   Diabetes Brother    Breast cancer Maternal Aunt        dx >50, d. from cancer   Cancer Maternal Grandmother        unk type, back cancer?     SOCIAL HISTORY:  reports that she quit smoking about 8 years ago. Her smoking use included cigarettes. She started smoking about 23 years ago. She has a 12.5 pack-year smoking history. She has never used smokeless tobacco. She reports current alcohol use of about 2.0 - 3.0 standard drinks of alcohol per week. She reports that she does not use drugs.  The patient is married and lives in [place],  . She's accompanied by her  husband.   ALLERGIES: Penicillins and Sulfa antibiotics   MEDICATIONS:  Current Outpatient Medications  Medication Sig Dispense Refill   Alcohol Swabs (B-D SINGLE USE SWABS REGULAR) PADS Test BS daily and as needed Dx E11.9 100 each 3   aluminum -magnesium  hydroxide-simethicone  (MAALOX) 200-200-20 MG/5ML SUSP Take 30 mLs by mouth 4 (four) times daily -  before meals and at bedtime. 480 mL 2   amLODipine  (NORVASC ) 10 MG tablet 1 tablet daily 90 tablet 3   Apple Cider Vinegar 500 MG TABS Take 500 mg by mouth in the morning.     aspirin  81 MG EC tablet Take 1 tablet (81 mg total) by mouth daily. 30 tablet 0   atorvastatin  (LIPITOR ) 80 MG tablet TAKE 1 TABLET EVERY DAY AT 6PM 90 tablet 3   betamethasone  dipropionate 0.05 % cream Apply topically 2 (two) times daily. 30 g 0   Blood Glucose Calibration (TRUE METRIX LEVEL 1) Low SOLN Use with glucometer Dx E11.9 3 each 0   Blood Glucose Monitoring Suppl (TRUE METRIX AIR GLUCOSE METER) w/Device KIT  Test BS daily and as needed Dx E11.9 1 kit 0   Calcium  Carb-Cholecalciferol (CALCIUM -VITAMIN D) 600-400 MG-UNIT TABS Take 1 tablet by mouth in the morning.     capecitabine  (XELODA ) 500 MG tablet Take 2 tablets (1,000 mg total) by mouth 2 (two) times daily after a meal. Take for 14 days, then hold for 7 days. Repeat every 21 days. 56 tablet 5   cetirizine  (ZYRTEC ) 10 MG tablet Take 1 tablet (10 mg total) by mouth daily. 30 tablet 3   Coenzyme Q10 (COQ10) 100 MG CAPS Take 100 mg by mouth in the morning.     Cranberry 425 MG CAPS Take 425 mg by mouth in the morning and at bedtime.     diphenoxylate -atropine  (LOMOTIL ) 2.5-0.025 MG tablet Take 1 tablet by mouth 4 (four) times daily as needed for diarrhea or loose stools. 30 tablet 0   escitalopram  (LEXAPRO ) 10 MG tablet Take 1 tablet (10 mg total) by mouth daily. 90 tablet 1   Flaxseed, Linseed, (FLAXSEED OIL) 1000 MG CAPS Take 1,000 mg by mouth in the morning.     gabapentin  (NEURONTIN ) 300 MG capsule Take 1  capsule (300 mg total) by mouth 2 (two) times daily. 60 capsule 0   glucose blood (TRUE METRIX BLOOD GLUCOSE TEST) test strip Test BS daily and as needed Dx E11.9 100 each 3   icosapent  Ethyl (VASCEPA ) 1 g capsule Take 2 g by mouth 2 (two) times daily.     Krill Oil 500 MG CAPS Take 3 capsules (1,500 mg total) by mouth in the morning and at bedtime.     lidocaine  (XYLOCAINE ) 2 % solution Use as directed 15 mLs in the mouth or throat every 6 (six) hours as needed for mouth pain. 250 mL 1   lidocaine -prilocaine  (EMLA ) cream Apply 1 Application topically as needed. 30 g 0   LORazepam  (ATIVAN ) 1 MG tablet Take 1 tablet (1 mg total) by mouth 2 (two) times daily as needed for anxiety. 10 tablet 0   magnesium  oxide (MAG-OX) 400 (240 Mg) MG tablet TAKE 1 TABLET BY MOUTH TWICE A DAY 60 tablet 1   meloxicam  (MOBIC ) 15 MG tablet TAKE 1 TABLET (15 MG TOTAL) BY MOUTH DAILY FOR JOINT AND MUSCLE PAIN 90 tablet 0   metoprolol  (TOPROL -XL) 200 MG 24 hr tablet TAKE 1 TABLET ONE TIME DAILY, WITH OR IMMEDIATELY FOLLOWING A MEAL 90 tablet 3   Multiple Vitamin (MULTIVITAMIN) capsule Take 1 capsule by mouth in the morning.     ondansetron  (ZOFRAN ) 8 MG tablet Take 1 tablet (8 mg total) by mouth every 8 (eight) hours as needed for nausea or vomiting. 30 tablet 3   pantoprazole  (PROTONIX ) 40 MG tablet TAKE 1 TABLET twice daily FOR STOMACH 180 tablet 3   traZODone  (DESYREL ) 150 MG tablet TAKE 1 OR 2 TABLETS AT BEDTIME FOR SLEEP 180 tablet 3   TRUEplus Lancets 33G MISC Test BS daily and as needed Dx E11.9 100 each 3   valsartan  (DIOVAN ) 320 MG tablet Take 1 tablet (320 mg total) by mouth daily. For blood pressure. 90 tablet 3   zinc  gluconate 50 MG tablet Take 50 mg by mouth daily.     Zinc  Oxide 20 % PSTE Apply 1 Application topically 3 (three) times daily. 57 g 1   No current facility-administered medications for this encounter.     REVIEW OF SYSTEMS: On review of systems, the patient reports that she is doing well  overall and has done well tolerating  chemo and immunotherapy. She denies any concerns about palpable findings in the chest wall since we last met by phone. No other complaints are verbalized.     PHYSICAL EXAM:  Wt Readings from Last 3 Encounters:  05/16/23 148 lb (67.1 kg)  05/16/23 148 lb 3.2 oz (67.2 kg)  05/13/23 148 lb (67.1 kg)   Temp Readings from Last 3 Encounters:  05/16/23 98.1 F (36.7 C) (Oral)  05/16/23 98.1 F (36.7 C) (Oral)  05/13/23 (!) 97.3 F (36.3 C) (Tympanic)   BP Readings from Last 3 Encounters:  05/16/23 (!) 147/67  05/16/23 (!) 147/67  05/13/23 (!) 115/53   Pulse Readings from Last 3 Encounters:  05/16/23 72  05/16/23 72  05/13/23 65   In general this is a well appearing caucasian female in no acute distress. she's alert and oriented x4 and appropriate throughout the examination. Cardiopulmonary assessment is negative for acute distress and she exhibits normal effort. Her right chest wall and axillary site are inspected and she has a well healed surgical site without palpable nodularity or focal findings.    ECOG = 0  0 - Asymptomatic (Fully active, able to carry on all predisease activities without restriction)  1 - Symptomatic but completely ambulatory (Restricted in physically strenuous activity but ambulatory and able to carry out work of a light or sedentary nature. For example, light housework, office work)  2 - Symptomatic, <50% in bed during the day (Ambulatory and capable of all self care but unable to carry out any work activities. Up and about more than 50% of waking hours)  3 - Symptomatic, >50% in bed, but not bedbound (Capable of only limited self-care, confined to bed or chair 50% or more of waking hours)  4 - Bedbound (Completely disabled. Cannot carry on any self-care. Totally confined to bed or chair)  5 - Death   Raylene MM, Creech RH, Tormey DC, et al. [phone]). Toxicity and response criteria of the [facility]. Am. DOROTHA Bridges. Oncol. 5 (6): 649-55    LABORATORY DATA:  Lab Results  Component Value Date   WBC 2.8 (L) 05/13/2023   HGB 9.6 (L) 05/13/2023   HCT 27.9 (L) 05/13/2023   MCV 113.9 (H) 05/13/2023   PLT 118 (L) 05/13/2023   Lab Results  Component Value Date   NA 134 (L) 05/13/2023   K 4.1 05/13/2023   CL 101 05/13/2023   CO2 25 05/13/2023   Lab Results  Component Value Date   ALT 20 05/13/2023   AST 23 05/13/2023   ALKPHOS 108 05/13/2023   BILITOT 0.9 05/13/2023      RADIOGRAPHY: No results found.     IMPRESSION/PLAN: 1. 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 Dr. Dewey has reviewed her final pathology findings and again we reviewed the rationale for adjuvant radiotherapy. I am not quite sure what disconnect occurred since we had attempted to get her into another clinic and then back after noting her insurance wouldn't cover her treatment in [place]. That being said, fortunately she's continued with systemic therapy during this time and is now to the close of this. On exam there does not appear to be any new local findings and we reviewed   the rationale for external radiotherapy to the right chest wall as well as her regional lymph nodes to reduce risks of local recurrence. We discussed the risks, benefits, short, and long term effects  of radiotherapy, as well as the curative intent, and the patient is interested in proceeding.  I reviewed the delivery and logistics of radiotherapy and that Dr. Dewey recommends 6 1/2 weeks of radiotherapy to the right chest wall as well as lymph nodes. She in agreement to proceed. Written consent is obtained and placed in the chart, a copy was provided to the patient. She will simulate today and we will begin treatment the week after she finishes her immunotherapy around 06/10/23.   In a visit lasting 45 minutes, greater than 50% of the time  was spent face to face discussing the patient's condition, in preparation for the discussion, and coordinating the patient's care.      Donald KYM Husband, [facility]    **Disclaimer: This note was dictated with voice recognition software. Similar sounding words can inadvertently be transcribed and this note may contain transcription errors which may not have been corrected upon publication of note.**

## 2023-05-16 NOTE — Progress Notes (Signed)
 Nursing interview for Malignant neoplasm of overlapping sites of right breast in female, estrogen receptor negative (HCC).    Patient identity verified x2.  Patient reports fatigue due to chemo but otherwise doing well. Mastectomy site doing well. Patient denies any other related issues at this time.  Meaningful use complete.  Vitals- BP (!) 147/67 (BP Location: Left Arm, Patient Position: Sitting, Cuff Size: Normal)   Pulse 72   Temp 98.1 F (36.7 C) (Oral)   Resp 19   Ht 5' 2 (1.575 m)   Wt 148 lb (67.1 kg)   SpO2 100%   BMI 27.07 kg/m    This concludes the interaction.  Rosaline Minerva, LPN

## 2023-05-16 NOTE — Progress Notes (Signed)
 Patient states Therapy Completed

## 2023-05-17 ENCOUNTER — Other Ambulatory Visit: Payer: Self-pay | Admitting: Family Medicine

## 2023-05-21 ENCOUNTER — Ambulatory Visit: Payer: No Typology Code available for payment source | Admitting: Radiation Oncology

## 2023-05-25 DIAGNOSIS — E119 Type 2 diabetes mellitus without complications: Secondary | ICD-10-CM | POA: Diagnosis not present

## 2023-05-25 DIAGNOSIS — H5203 Hypermetropia, bilateral: Secondary | ICD-10-CM | POA: Diagnosis not present

## 2023-05-29 DIAGNOSIS — Z171 Estrogen receptor negative status [ER-]: Secondary | ICD-10-CM | POA: Diagnosis not present

## 2023-05-29 DIAGNOSIS — C50811 Malignant neoplasm of overlapping sites of right female breast: Secondary | ICD-10-CM | POA: Diagnosis not present

## 2023-05-29 DIAGNOSIS — Z510 Encounter for antineoplastic radiation therapy: Secondary | ICD-10-CM | POA: Diagnosis not present

## 2023-05-30 ENCOUNTER — Ambulatory Visit: Payer: No Typology Code available for payment source | Admitting: Radiation Oncology

## 2023-05-31 ENCOUNTER — Ambulatory Visit: Payer: No Typology Code available for payment source

## 2023-05-31 ENCOUNTER — Ambulatory Visit: Admission: RE | Admit: 2023-05-31 | Payer: No Typology Code available for payment source | Source: Ambulatory Visit

## 2023-06-03 ENCOUNTER — Ambulatory Visit: Payer: No Typology Code available for payment source

## 2023-06-03 ENCOUNTER — Other Ambulatory Visit: Payer: Self-pay

## 2023-06-03 ENCOUNTER — Inpatient Hospital Stay: Payer: No Typology Code available for payment source

## 2023-06-03 ENCOUNTER — Ambulatory Visit: Payer: No Typology Code available for payment source | Admitting: Hematology

## 2023-06-03 ENCOUNTER — Other Ambulatory Visit: Payer: No Typology Code available for payment source

## 2023-06-03 ENCOUNTER — Ambulatory Visit
Admission: RE | Admit: 2023-06-03 | Discharge: 2023-06-03 | Disposition: A | Discharge status: Home / Self Care | Payer: No Typology Code available for payment source | Source: Ambulatory Visit | Attending: Radiation Oncology

## 2023-06-03 VITALS — BP 140/75 | HR 60 | Temp 97.4°F | Resp 18 | Wt 151.0 lb

## 2023-06-03 DIAGNOSIS — C50811 Malignant neoplasm of overlapping sites of right female breast: Secondary | ICD-10-CM | POA: Diagnosis not present

## 2023-06-03 DIAGNOSIS — Z171 Estrogen receptor negative status [ER-]: Secondary | ICD-10-CM

## 2023-06-03 DIAGNOSIS — Z5112 Encounter for antineoplastic immunotherapy: Secondary | ICD-10-CM | POA: Diagnosis not present

## 2023-06-03 DIAGNOSIS — Z510 Encounter for antineoplastic radiation therapy: Secondary | ICD-10-CM | POA: Diagnosis not present

## 2023-06-03 LAB — RAD ONC ARIA SESSION SUMMARY
Course Elapsed Days: 0
Plan Fractions Treated to Date: 1
Plan Fractions Treated to Date: 1
Plan Prescribed Dose Per Fraction: 1.8 Gy
Plan Prescribed Dose Per Fraction: 1.8 Gy
Plan Total Fractions Prescribed: 14
Plan Total Fractions Prescribed: 28
Plan Total Prescribed Dose: 25.2 Gy
Plan Total Prescribed Dose: 50.4 Gy
Reference Point Dosage Given to Date: 1.8 Gy
Reference Point Dosage Given to Date: 1.8 Gy
Reference Point Session Dosage Given: 1.8 Gy
Reference Point Session Dosage Given: 1.8 Gy
Session Number: 1

## 2023-06-03 LAB — CBC WITH DIFFERENTIAL/PLATELET
Abs Immature Granulocytes: 0.01 10*3/uL (ref 0.00–0.07)
Basophils Absolute: 0 10*3/uL (ref 0.0–0.1)
Basophils Relative: 1 %
Eosinophils Absolute: 0.1 10*3/uL (ref 0.0–0.5)
Eosinophils Relative: 3 %
HCT: 28.4 % — ABNORMAL LOW (ref 36.0–46.0)
Hemoglobin: 10.1 g/dL — ABNORMAL LOW (ref 12.0–15.0)
Immature Granulocytes: 0 %
Lymphocytes Relative: 17 %
Lymphs Abs: 0.7 10*3/uL (ref 0.7–4.0)
MCH: 40.4 pg — ABNORMAL HIGH (ref 26.0–34.0)
MCHC: 35.6 g/dL (ref 30.0–36.0)
MCV: 113.6 fL — ABNORMAL HIGH (ref 80.0–100.0)
Monocytes Absolute: 0.3 10*3/uL (ref 0.1–1.0)
Monocytes Relative: 7 %
Neutro Abs: 2.9 10*3/uL (ref 1.7–7.7)
Neutrophils Relative %: 72 %
Platelets: 109 10*3/uL — ABNORMAL LOW (ref 150–400)
RBC: 2.5 MIL/uL — ABNORMAL LOW (ref 3.87–5.11)
RDW: 16.1 % — ABNORMAL HIGH (ref 11.5–15.5)
WBC: 4 10*3/uL (ref 4.0–10.5)
nRBC: 0 % (ref 0.0–0.2)

## 2023-06-03 LAB — COMPREHENSIVE METABOLIC PANEL
ALT: 25 U/L (ref 0–44)
AST: 25 U/L (ref 15–41)
Albumin: 3.8 g/dL (ref 3.5–5.0)
Alkaline Phosphatase: 98 U/L (ref 38–126)
Anion gap: 8 (ref 5–15)
BUN: 17 mg/dL (ref 8–23)
CO2: 25 mmol/L (ref 22–32)
Calcium: 9 mg/dL (ref 8.9–10.3)
Chloride: 102 mmol/L (ref 98–111)
Creatinine, Ser: 0.91 mg/dL (ref 0.44–1.00)
GFR, Estimated: >60 mL/min (ref >60)
Glucose, Bld: 156 mg/dL — ABNORMAL HIGH (ref 70–99)
Potassium: 3.9 mmol/L (ref 3.5–5.1)
Sodium: 135 mmol/L (ref 135–145)
Total Bilirubin: 1 mg/dL (ref 0.0–1.2)
Total Protein: 6.7 g/dL (ref 6.5–8.1)

## 2023-06-03 LAB — TSH: TSH: 1.402 u[IU]/mL (ref 0.350–4.500)

## 2023-06-03 LAB — MAGNESIUM: Magnesium: 1.7 mg/dL (ref 1.7–2.4)

## 2023-06-03 MED ORDER — SODIUM CHLORIDE 0.9 % IV SOLN
200.0000 mg | Freq: Once | INTRAVENOUS | Status: AC
  Administered 2023-06-03: 200 mg via INTRAVENOUS
  Filled 2023-06-03: qty 8

## 2023-06-03 MED ORDER — SODIUM CHLORIDE 0.9% FLUSH
10.0000 mL | INTRAVENOUS | Status: DC | PRN
Start: 2023-06-03 — End: 2023-06-03
  Administered 2023-06-03: 10 mL

## 2023-06-03 MED ORDER — SODIUM CHLORIDE 0.9 % IV SOLN: Freq: Once | INTRAVENOUS | Status: AC

## 2023-06-03 MED ORDER — HEPARIN SOD (PORK) LOCK FLUSH 100 UNIT/ML IV SOLN
500.0000 [IU] | Freq: Once | INTRAVENOUS | Status: AC | PRN
  Administered 2023-06-03: 500 [IU]

## 2023-06-03 NOTE — Progress Notes (Signed)
Patient presents today for Keytruda infusion per providers order.  Vital signs within parameters for treatment.  Labs pending.  Patient has no new complaints at this time.  Labs within parameters for treatment.  Treatment given today per MD orders.  Keytruda infusion without adverse affects.  Vital signs stable.  No complaints at this time.  Discharge from clinic ambulatory in stable condition.  Alert and oriented X 3.  Follow up with [facility] as scheduled.

## 2023-06-03 NOTE — Patient Instructions (Signed)
[facility] [place] - A DEPT OF [facility]  Discharge Instructions: Thank you for choosing [facility] to provide your oncology and hematology care.  If you have a lab appointment with the Cancer Center - please note that after April 8th, 2024, all labs will be drawn in the cancer center.  You do not have to check in or register with the main entrance as you have in the past but will complete your check-in in the cancer center.  Wear comfortable clothing and clothing appropriate for easy access to any Portacath or PICC line.   We strive to give you quality time with your provider. You may need to reschedule your appointment if you arrive late (15 or more minutes).  Arriving late affects you and other patients whose appointments are after yours.  Also, if you miss three or more appointments without notifying the office, you may be dismissed from the clinic at the provider's discretion.      For prescription refill requests, have your pharmacy contact our office and allow 72 hours for refills to be completed.    Today you received the following chemotherapy and/or immunotherapy agents Keytruda      To help prevent nausea and vomiting after your treatment, we encourage you to take your nausea medication as directed.  BELOW ARE SYMPTOMS THAT SHOULD BE REPORTED IMMEDIATELY: *FEVER GREATER THAN 100.4 F (38 C) OR HIGHER *CHILLS OR SWEATING *NAUSEA AND VOMITING THAT IS NOT CONTROLLED WITH YOUR NAUSEA MEDICATION *UNUSUAL SHORTNESS OF BREATH *UNUSUAL BRUISING OR BLEEDING *URINARY PROBLEMS (pain or burning when urinating, or frequent urination) *BOWEL PROBLEMS (unusual diarrhea, constipation, pain near the anus) TENDERNESS IN MOUTH AND THROAT WITH OR WITHOUT PRESENCE OF ULCERS (sore throat, sores in mouth, or a toothache) UNUSUAL RASH, SWELLING OR PAIN  UNUSUAL VAGINAL DISCHARGE OR ITCHING   Items with * indicate a potential emergency and should be followed up  as soon as possible or go to the Emergency Department if any problems should occur.  Please show the CHEMOTHERAPY ALERT CARD or IMMUNOTHERAPY ALERT CARD at check-in to the Emergency Department and triage nurse.  Should you have questions after your visit or need to cancel or reschedule your appointment, please contact [facility] [place] - A DEPT OF Eligh[facility] Brideg[facility] [phone]  and follow the prompts.  Office hours are 8:00 a.m. to 4:30 p.m. Monday - Friday. Please note that voicemails left after 4:00 p.m. may not be returned until the following business day.  We are closed weekends and major holidays. You have access to a nurse at all times for urgent questions. Please call the main number to the clinic [phone] and follow the prompts.  For any non-urgent questions, you may also contact your provider using MyChart. We now offer e-Visits for anyone 21 and older to request care online for non-urgent symptoms. For details visit [email or web address].   Also download the MyChart app! Go to the app store, search "MyChart", open the app, select [place], and log in with your MyChart username and password.

## 2023-06-04 ENCOUNTER — Inpatient Hospital Stay: Payer: No Typology Code available for payment source

## 2023-06-04 ENCOUNTER — Other Ambulatory Visit: Payer: Self-pay

## 2023-06-04 ENCOUNTER — Ambulatory Visit
Admission: RE | Admit: 2023-06-04 | Discharge: 2023-06-04 | Disposition: A | Discharge status: Home / Self Care | Payer: No Typology Code available for payment source | Source: Ambulatory Visit | Attending: Radiation Oncology | Admitting: Radiation Oncology

## 2023-06-04 ENCOUNTER — Inpatient Hospital Stay: Payer: No Typology Code available for payment source | Admitting: Hematology

## 2023-06-04 DIAGNOSIS — C50811 Malignant neoplasm of overlapping sites of right female breast: Secondary | ICD-10-CM | POA: Diagnosis not present

## 2023-06-04 DIAGNOSIS — Z510 Encounter for antineoplastic radiation therapy: Secondary | ICD-10-CM | POA: Diagnosis not present

## 2023-06-04 DIAGNOSIS — Z171 Estrogen receptor negative status [ER-]: Secondary | ICD-10-CM | POA: Diagnosis not present

## 2023-06-04 LAB — RAD ONC ARIA SESSION SUMMARY
Course Elapsed Days: 1
Plan Fractions Treated to Date: 1
Plan Fractions Treated to Date: 2
Plan Prescribed Dose Per Fraction: 1.8 Gy
Plan Prescribed Dose Per Fraction: 1.8 Gy
Plan Total Fractions Prescribed: 14
Plan Total Fractions Prescribed: 28
Plan Total Prescribed Dose: 25.2 Gy
Plan Total Prescribed Dose: 50.4 Gy
Reference Point Dosage Given to Date: 1.8 Gy
Reference Point Dosage Given to Date: 3.6 Gy
Reference Point Session Dosage Given: 1.8 Gy
Reference Point Session Dosage Given: 1.8 Gy
Session Number: 2

## 2023-06-04 NOTE — Telephone Encounter (Signed)
Emailed Pg 8 of application to [place] again 06/04/23.

## 2023-06-05 ENCOUNTER — Ambulatory Visit
Admission: RE | Admit: 2023-06-05 | Discharge: 2023-06-05 | Disposition: A | Discharge status: Home / Self Care | Payer: No Typology Code available for payment source | Source: Ambulatory Visit | Attending: Radiation Oncology | Admitting: Radiation Oncology

## 2023-06-05 ENCOUNTER — Other Ambulatory Visit: Payer: Self-pay

## 2023-06-05 DIAGNOSIS — Z510 Encounter for antineoplastic radiation therapy: Secondary | ICD-10-CM | POA: Diagnosis not present

## 2023-06-05 LAB — RAD ONC ARIA SESSION SUMMARY
Course Elapsed Days: 2
Plan Fractions Treated to Date: 2
Plan Fractions Treated to Date: 3
Plan Prescribed Dose Per Fraction: 1.8 Gy
Plan Prescribed Dose Per Fraction: 1.8 Gy
Plan Total Fractions Prescribed: 14
Plan Total Fractions Prescribed: 28
Plan Total Prescribed Dose: 25.2 Gy
Plan Total Prescribed Dose: 50.4 Gy
Reference Point Dosage Given to Date: 3.6 Gy
Reference Point Dosage Given to Date: 5.4 Gy
Reference Point Session Dosage Given: 1.8 Gy
Reference Point Session Dosage Given: 1.8 Gy
Session Number: 3

## 2023-06-06 ENCOUNTER — Other Ambulatory Visit: Payer: Self-pay

## 2023-06-06 ENCOUNTER — Ambulatory Visit
Admission: RE | Admit: 2023-06-06 | Discharge: 2023-06-06 | Disposition: A | Discharge status: Home / Self Care | Payer: No Typology Code available for payment source | Source: Ambulatory Visit | Attending: Radiation Oncology | Admitting: Radiation Oncology

## 2023-06-06 DIAGNOSIS — Z510 Encounter for antineoplastic radiation therapy: Secondary | ICD-10-CM | POA: Diagnosis not present

## 2023-06-06 LAB — RAD ONC ARIA SESSION SUMMARY
Course Elapsed Days: 3
Plan Fractions Treated to Date: 2
Plan Fractions Treated to Date: 4
Plan Prescribed Dose Per Fraction: 1.8 Gy
Plan Prescribed Dose Per Fraction: 1.8 Gy
Plan Total Fractions Prescribed: 14
Plan Total Fractions Prescribed: 28
Plan Total Prescribed Dose: 25.2 Gy
Plan Total Prescribed Dose: 50.4 Gy
Reference Point Dosage Given to Date: 3.6 Gy
Reference Point Dosage Given to Date: 7.2 Gy
Reference Point Session Dosage Given: 1.8 Gy
Reference Point Session Dosage Given: 1.8 Gy
Session Number: 4

## 2023-06-07 ENCOUNTER — Ambulatory Visit
Admission: RE | Admit: 2023-06-07 | Discharge: 2023-06-07 | Disposition: A | Discharge status: Home / Self Care | Payer: No Typology Code available for payment source | Source: Ambulatory Visit | Attending: Radiation Oncology | Admitting: Radiation Oncology

## 2023-06-07 ENCOUNTER — Other Ambulatory Visit: Payer: Self-pay

## 2023-06-07 DIAGNOSIS — C50911 Malignant neoplasm of unspecified site of right female breast: Secondary | ICD-10-CM | POA: Diagnosis not present

## 2023-06-07 DIAGNOSIS — C50811 Malignant neoplasm of overlapping sites of right female breast: Secondary | ICD-10-CM | POA: Diagnosis not present

## 2023-06-07 DIAGNOSIS — Z171 Estrogen receptor negative status [ER-]: Secondary | ICD-10-CM | POA: Diagnosis not present

## 2023-06-07 DIAGNOSIS — Z170 Estrogen receptor positive status [ER+]: Secondary | ICD-10-CM | POA: Diagnosis not present

## 2023-06-07 DIAGNOSIS — Z510 Encounter for antineoplastic radiation therapy: Secondary | ICD-10-CM | POA: Diagnosis not present

## 2023-06-07 LAB — RAD ONC ARIA SESSION SUMMARY
Course Elapsed Days: 4
Plan Fractions Treated to Date: 3
Plan Fractions Treated to Date: 5
Plan Prescribed Dose Per Fraction: 1.8 Gy
Plan Prescribed Dose Per Fraction: 1.8 Gy
Plan Total Fractions Prescribed: 14
Plan Total Fractions Prescribed: 28
Plan Total Prescribed Dose: 25.2 Gy
Plan Total Prescribed Dose: 50.4 Gy
Reference Point Dosage Given to Date: 5.4 Gy
Reference Point Dosage Given to Date: 9 Gy
Reference Point Session Dosage Given: 1.8 Gy
Reference Point Session Dosage Given: 1.8 Gy
Session Number: 5

## 2023-06-07 MED ORDER — RADIAPLEXRX EX GEL: Freq: Once | CUTANEOUS | Status: AC

## 2023-06-07 MED ORDER — ALRA NON-METALLIC DEODORANT (RAD-ONC)
1.0000 | Freq: Once | TOPICAL | Status: AC
  Administered 2023-06-07: 1 via TOPICAL

## 2023-06-07 NOTE — Progress Notes (Unsigned)
Pharmacy Medication Assistance Program Note    06/07/2023  Patient ID: Marilyn Ford, female   DOB: 09/15/1960, 63 y.o.   MRN: [id number]     03/07/2023  [employer]reach [employer]  Manufacturer [employer] Trulicity  Type of Radiographer, therapeutic Assistance  Date Application Sent to Prescriber 03/07/2023  Name of Prescriber Mechele Claude  Date Application Received From Patient 03/05/2023  Application Items Received From Patient Application  Date Application Received From Provider 06/05/2023  Method Application Sent to Manufacturer Fax     Renewal submitted. Please send new 90 day RX for trulicity to [facility] specialty pharmacy. Thanks!

## 2023-06-10 ENCOUNTER — Other Ambulatory Visit: Payer: Self-pay | Admitting: Radiation Oncology

## 2023-06-10 ENCOUNTER — Ambulatory Visit
Admission: RE | Admit: 2023-06-10 | Discharge: 2023-06-10 | Disposition: A | Discharge status: Home / Self Care | Payer: No Typology Code available for payment source | Source: Ambulatory Visit | Attending: Radiation Oncology | Admitting: Radiation Oncology

## 2023-06-10 ENCOUNTER — Other Ambulatory Visit: Payer: Self-pay

## 2023-06-10 ENCOUNTER — Ambulatory Visit: Payer: No Typology Code available for payment source | Attending: Surgery

## 2023-06-10 VITALS — Wt 150.4 lb

## 2023-06-10 DIAGNOSIS — Z171 Estrogen receptor negative status [ER-]: Secondary | ICD-10-CM

## 2023-06-10 DIAGNOSIS — Z1722 Progesterone receptor negative status: Secondary | ICD-10-CM | POA: Diagnosis not present

## 2023-06-10 DIAGNOSIS — Z483 Aftercare following surgery for neoplasm: Secondary | ICD-10-CM | POA: Insufficient documentation

## 2023-06-10 DIAGNOSIS — Z510 Encounter for antineoplastic radiation therapy: Secondary | ICD-10-CM | POA: Diagnosis not present

## 2023-06-10 DIAGNOSIS — C50811 Malignant neoplasm of overlapping sites of right female breast: Secondary | ICD-10-CM

## 2023-06-10 DIAGNOSIS — Z1732 Human epidermal growth factor receptor 2 negative status: Secondary | ICD-10-CM | POA: Insufficient documentation

## 2023-06-10 LAB — RAD ONC ARIA SESSION SUMMARY
Course Elapsed Days: 7
Plan Fractions Treated to Date: 3
Plan Fractions Treated to Date: 6
Plan Prescribed Dose Per Fraction: 1.8 Gy
Plan Prescribed Dose Per Fraction: 1.8 Gy
Plan Total Fractions Prescribed: 14
Plan Total Fractions Prescribed: 28
Plan Total Prescribed Dose: 25.2 Gy
Plan Total Prescribed Dose: 50.4 Gy
Reference Point Dosage Given to Date: 10.8 Gy
Reference Point Dosage Given to Date: 5.4 Gy
Reference Point Session Dosage Given: 1.8 Gy
Reference Point Session Dosage Given: 1.8 Gy
Session Number: 6

## 2023-06-10 NOTE — Therapy (Signed)
OUTPATIENT PHYSICAL THERAPY SOZO SCREENING NOTE   Patient Name: Marilyn Ford MRN: [id number] DOB:[birth year], 63 y.o., female Today's Date: 06/10/2023  PCP: Mechele Claude, MD REFERRING PROVIDER: Abigail Miyamoto, MD   PT End of Session - 06/10/23 [phone]     Visit Number 4   # unchanged due to screen only   PT Start Time 0930    PT Stop Time 0936    PT Time Calculation (min) 6 min    Activity Tolerance Patient tolerated treatment well    Behavior During Therapy [facility] for tasks assessed/performed             Past Medical History:  Diagnosis Date   Allergy    Anxiety    Breast cancer (HCC)    Diabetes mellitus without complication (HCC)    GERD (gastroesophageal reflux disease)    Heart murmur    as a child   History of kidney stones    Hypertension    Pneumonia    Stroke [facility])    Vaginal Pap smear, abnormal    Past Surgical History:  Procedure Laterality Date   BREAST BIOPSY  10/31/2022   [place] RT RADIOACTIVE SEED LOC 10/31/2022 [facility] MAMMOGRAPHY   CESAREAN SECTION     CHOLECYSTECTOMY     COLONOSCOPY WITH PROPOFOL N/A 07/19/2020   Procedure: COLONOSCOPY WITH PROPOFOL;  Surgeon: Dolores Frame, MD;  [facility];  Service: Gastroenterology;  Laterality: N/A;  AM   POLYPECTOMY  07/19/2020   Procedure: POLYPECTOMY;  Surgeon: Dolores Frame, MD;  [facility];  Service: Gastroenterology;;   PORTACATH PLACEMENT N/A 03/20/2022   Procedure: INSERTION PORT-A-CATH;  Surgeon: Abigail Miyamoto, MD;  [facility]: [facility];  Service: General;  Laterality: N/A;   RADIOACTIVE SEED GUIDED AXILLARY SENTINEL LYMPH NODE Right 11/01/2022   Procedure: RADIOACTIVE SEED GUIDED RIGHT AXILLARY SENTINEL LYMPH NODE DISSECTION;  Surgeon: Abigail Miyamoto, MD;  Location: [facility];  Service: General;  Laterality: Right;   SIMPLE MASTECTOMY WITH AXILLARY SENTINEL NODE BIOPSY Right 11/01/2022   Procedure: RIGHT SIMPLE MASTECTOMY;  Surgeon:  Abigail Miyamoto, MD;  Location: [facility];  Service: General;  Laterality: Right;   Patient Active Problem List   Diagnosis Date Noted   S/P mastectomy, right 11/01/2022   Genetic testing 06/05/2022   Neutropenia, drug-induced (HCC) 05/28/2022   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) 03/05/2022   Encounter for screening fecal occult blood testing 03/01/2022   Routine cervical smear 03/01/2022   Axillary adenopathy 02/20/2022   Mass of lower outer quadrant of right breast 01/30/2022   Cat bite of index finger 09/12/2021   Gastroesophageal reflux disease without esophagitis 10/20/2019   GAD (generalized anxiety disorder) 10/20/2019   Insomnia due to medical condition 10/20/2019   Hemiparesis affecting right side as late effect of cerebrovascular accident (CVA) (HCC) 04/22/2015   Diabetes mellitus type II, controlled (HCC) 12/23/2014   Hypertension 12/23/2014    REFERRING DIAG: right breast cancer at risk for lymphedema  THERAPY DIAG: Aftercare following surgery for neoplasm  PERTINENT HISTORY: Patient was diagnosed on 02/20/22 with right grade 3. It measures 3.8 cm and is located in the inner lower quadrant. 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 2 nodes taken at biopsy - 1 was negative the other was positive. Had blood transfusion on 10/05/22. Plan is to undergo a R simple mastectomy and SLNB on 11/01/22. Hx of  DVT and stroke   PRECAUTIONS: right UE Lymphedema risk, None  SUBJECTIVE: Pt returns for her 3 month L-Dex screen. "My balance has been bad since chemo. The doctor was also going to send me for my Rt shoulder after radiation."  PAIN:  Are you having pain? No  SOZO SCREENING: Patient was assessed today using the SOZO machine to determine the lymphedema index score. This was compared to her baseline score. It was determined that she is within the  recommended range when compared to her baseline and no further action is needed at this time. She will continue SOZO screenings. These are done every 3 months for 2 years post operatively followed by every 6 months for 2 years, and then annually.  Patient reported a change in status to PTA which initiated the PTA consulting with a PT. PT determined it would be appropriate to initiate therapy at this time. PT requested a referral from patient's provider.    L-DEX FLOWSHEETS - 06/10/23 0900       L-DEX LYMPHEDEMA SCREENING   Measurement Type Unilateral    L-DEX MEASUREMENT EXTREMITY Upper Extremity    POSITION  Standing    DOMINANT SIDE Right    At Risk Side Right    BASELINE SCORE (UNILATERAL) -6.8    L-DEX SCORE (UNILATERAL) -2.8    VALUE CHANGE (UNILAT) 4             P: Eval for balance from CIPN symptoms and assess Rt shoulder per pt complaints.   Hermenia Bers, PTA 06/10/2023, 9:40 AM

## 2023-06-11 ENCOUNTER — Other Ambulatory Visit: Payer: Self-pay

## 2023-06-11 ENCOUNTER — Other Ambulatory Visit: Payer: Self-pay | Admitting: Family Medicine

## 2023-06-11 ENCOUNTER — Ambulatory Visit
Admission: RE | Admit: 2023-06-11 | Discharge: 2023-06-11 | Disposition: A | Discharge status: Home / Self Care | Payer: No Typology Code available for payment source | Source: Ambulatory Visit | Attending: Radiation Oncology

## 2023-06-11 DIAGNOSIS — C50811 Malignant neoplasm of overlapping sites of right female breast: Secondary | ICD-10-CM | POA: Diagnosis not present

## 2023-06-11 DIAGNOSIS — Z171 Estrogen receptor negative status [ER-]: Secondary | ICD-10-CM | POA: Diagnosis not present

## 2023-06-11 DIAGNOSIS — Z510 Encounter for antineoplastic radiation therapy: Secondary | ICD-10-CM | POA: Diagnosis not present

## 2023-06-11 LAB — RAD ONC ARIA SESSION SUMMARY
Course Elapsed Days: 8
Plan Fractions Treated to Date: 4
Plan Fractions Treated to Date: 7
Plan Prescribed Dose Per Fraction: 1.8 Gy
Plan Prescribed Dose Per Fraction: 1.8 Gy
Plan Total Fractions Prescribed: 14
Plan Total Fractions Prescribed: 28
Plan Total Prescribed Dose: 25.2 Gy
Plan Total Prescribed Dose: 50.4 Gy
Reference Point Dosage Given to Date: 12.6 Gy
Reference Point Dosage Given to Date: 7.2 Gy
Reference Point Session Dosage Given: 1.8 Gy
Reference Point Session Dosage Given: 1.8 Gy
Session Number: 7

## 2023-06-11 MED ORDER — TRULICITY 1.5 MG/0.5ML ~~LOC~~ SOAJ: 1.5000 mg | SUBCUTANEOUS | 4 refills | Status: DC

## 2023-06-12 ENCOUNTER — Other Ambulatory Visit: Payer: Self-pay

## 2023-06-12 ENCOUNTER — Ambulatory Visit
Admission: RE | Admit: 2023-06-12 | Discharge: 2023-06-12 | Disposition: A | Discharge status: Home / Self Care | Payer: No Typology Code available for payment source | Source: Ambulatory Visit | Attending: Radiation Oncology

## 2023-06-12 DIAGNOSIS — Z510 Encounter for antineoplastic radiation therapy: Secondary | ICD-10-CM | POA: Diagnosis not present

## 2023-06-12 LAB — RAD ONC ARIA SESSION SUMMARY
Course Elapsed Days: 9
Plan Fractions Treated to Date: 4
Plan Fractions Treated to Date: 8
Plan Prescribed Dose Per Fraction: 1.8 Gy
Plan Prescribed Dose Per Fraction: 1.8 Gy
Plan Total Fractions Prescribed: 14
Plan Total Fractions Prescribed: 28
Plan Total Prescribed Dose: 25.2 Gy
Plan Total Prescribed Dose: 50.4 Gy
Reference Point Dosage Given to Date: 14.4 Gy
Reference Point Dosage Given to Date: 7.2 Gy
Reference Point Session Dosage Given: 1.8 Gy
Reference Point Session Dosage Given: 1.8 Gy
Session Number: 8

## 2023-06-13 ENCOUNTER — Other Ambulatory Visit: Payer: Self-pay

## 2023-06-13 ENCOUNTER — Ambulatory Visit
Admission: RE | Admit: 2023-06-13 | Discharge: 2023-06-13 | Disposition: A | Discharge status: Home / Self Care | Payer: No Typology Code available for payment source | Source: Ambulatory Visit | Attending: Radiation Oncology | Admitting: Radiation Oncology

## 2023-06-13 DIAGNOSIS — Z510 Encounter for antineoplastic radiation therapy: Secondary | ICD-10-CM | POA: Diagnosis not present

## 2023-06-13 LAB — RAD ONC ARIA SESSION SUMMARY
Course Elapsed Days: 10
Plan Fractions Treated to Date: 5
Plan Fractions Treated to Date: 9
Plan Prescribed Dose Per Fraction: 1.8 Gy
Plan Prescribed Dose Per Fraction: 1.8 Gy
Plan Total Fractions Prescribed: 14
Plan Total Fractions Prescribed: 28
Plan Total Prescribed Dose: 25.2 Gy
Plan Total Prescribed Dose: 50.4 Gy
Reference Point Dosage Given to Date: 16.2 Gy
Reference Point Dosage Given to Date: 9 Gy
Reference Point Session Dosage Given: 1.8 Gy
Reference Point Session Dosage Given: 1.8 Gy
Session Number: 9

## 2023-06-14 ENCOUNTER — Ambulatory Visit
Admission: RE | Admit: 2023-06-14 | Discharge: 2023-06-14 | Disposition: A | Discharge status: Home / Self Care | Payer: No Typology Code available for payment source | Source: Ambulatory Visit | Attending: Radiation Oncology | Admitting: Radiation Oncology

## 2023-06-14 ENCOUNTER — Other Ambulatory Visit: Payer: Self-pay

## 2023-06-14 DIAGNOSIS — Z171 Estrogen receptor negative status [ER-]: Secondary | ICD-10-CM | POA: Diagnosis not present

## 2023-06-14 DIAGNOSIS — Z510 Encounter for antineoplastic radiation therapy: Secondary | ICD-10-CM | POA: Diagnosis not present

## 2023-06-14 DIAGNOSIS — C50811 Malignant neoplasm of overlapping sites of right female breast: Secondary | ICD-10-CM | POA: Diagnosis not present

## 2023-06-14 LAB — RAD ONC ARIA SESSION SUMMARY
Course Elapsed Days: 11
Plan Fractions Treated to Date: 10
Plan Fractions Treated to Date: 5
Plan Prescribed Dose Per Fraction: 1.8 Gy
Plan Prescribed Dose Per Fraction: 1.8 Gy
Plan Total Fractions Prescribed: 14
Plan Total Fractions Prescribed: 28
Plan Total Prescribed Dose: 25.2 Gy
Plan Total Prescribed Dose: 50.4 Gy
Reference Point Dosage Given to Date: 18 Gy
Reference Point Dosage Given to Date: 9 Gy
Reference Point Session Dosage Given: 1.8 Gy
Reference Point Session Dosage Given: 1.8 Gy
Session Number: 10

## 2023-06-17 ENCOUNTER — Ambulatory Visit
Admission: RE | Admit: 2023-06-17 | Discharge: 2023-06-17 | Disposition: A | Discharge status: Home / Self Care | Payer: No Typology Code available for payment source | Source: Ambulatory Visit | Attending: Radiation Oncology | Admitting: Radiation Oncology

## 2023-06-17 ENCOUNTER — Other Ambulatory Visit: Payer: Self-pay

## 2023-06-17 DIAGNOSIS — Z510 Encounter for antineoplastic radiation therapy: Secondary | ICD-10-CM | POA: Diagnosis not present

## 2023-06-17 LAB — RAD ONC ARIA SESSION SUMMARY
Course Elapsed Days: 14
Plan Fractions Treated to Date: 11
Plan Fractions Treated to Date: 6
Plan Prescribed Dose Per Fraction: 1.8 Gy
Plan Prescribed Dose Per Fraction: 1.8 Gy
Plan Total Fractions Prescribed: 14
Plan Total Fractions Prescribed: 28
Plan Total Prescribed Dose: 25.2 Gy
Plan Total Prescribed Dose: 50.4 Gy
Reference Point Dosage Given to Date: 10.8 Gy
Reference Point Dosage Given to Date: 19.8 Gy
Reference Point Session Dosage Given: 1.8 Gy
Reference Point Session Dosage Given: 1.8 Gy
Session Number: 11

## 2023-06-18 ENCOUNTER — Other Ambulatory Visit: Payer: Self-pay

## 2023-06-18 ENCOUNTER — Ambulatory Visit
Admission: RE | Admit: 2023-06-18 | Discharge: 2023-06-18 | Discharge status: Home / Self Care | Payer: No Typology Code available for payment source | Source: Ambulatory Visit | Attending: Radiation Oncology

## 2023-06-18 DIAGNOSIS — Z510 Encounter for antineoplastic radiation therapy: Secondary | ICD-10-CM | POA: Diagnosis not present

## 2023-06-18 LAB — RAD ONC ARIA SESSION SUMMARY
Course Elapsed Days: 15
Plan Fractions Treated to Date: 12
Plan Fractions Treated to Date: 6
Plan Prescribed Dose Per Fraction: 1.8 Gy
Plan Prescribed Dose Per Fraction: 1.8 Gy
Plan Total Fractions Prescribed: 14
Plan Total Fractions Prescribed: 28
Plan Total Prescribed Dose: 25.2 Gy
Plan Total Prescribed Dose: 50.4 Gy
Reference Point Dosage Given to Date: 10.8 Gy
Reference Point Dosage Given to Date: 21.6 Gy
Reference Point Session Dosage Given: 1.8 Gy
Reference Point Session Dosage Given: 1.8 Gy
Session Number: 12

## 2023-06-18 NOTE — Therapy (Signed)
OUTPATIENT PHYSICAL THERAPY   ONCOLOGY EVALUATION  Patient Name: Marilyn Ford MRN: [id number] DOB:Feb 06, 1961, 63 y.o., female Today's Date: 06/21/2023  END OF SESSION:    Past Medical History:  Diagnosis Date   Allergy    Anxiety    Breast cancer (HCC)    Diabetes mellitus without complication (HCC)    GERD (gastroesophageal reflux disease)    Heart murmur    as a child   History of kidney stones    Hypertension    Pneumonia    Stroke [facility])    Vaginal Pap smear, abnormal    Past Surgical History:  Procedure Laterality Date   BREAST BIOPSY  10/31/2022   [place] RT RADIOACTIVE SEED LOC 10/31/2022 [facility] MAMMOGRAPHY   CESAREAN SECTION     CHOLECYSTECTOMY     COLONOSCOPY WITH PROPOFOL N/A 07/19/2020   Procedure: COLONOSCOPY WITH PROPOFOL;  Surgeon: Dolores Frame, MD;  [facility];  Service: Gastroenterology;  Laterality: N/A;  AM   POLYPECTOMY  07/19/2020   Procedure: POLYPECTOMY;  Surgeon: Dolores Frame, MD;  [facility];  Service: Gastroenterology;;   PORTACATH PLACEMENT N/A 03/20/2022   Procedure: INSERTION PORT-A-CATH;  Surgeon: Abigail Miyamoto, MD;  [facility];  Service: General;  Laterality: N/A;   RADIOACTIVE SEED GUIDED AXILLARY SENTINEL LYMPH NODE Right 11/01/2022   Procedure: RADIOACTIVE SEED GUIDED RIGHT AXILLARY SENTINEL LYMPH NODE DISSECTION;  Surgeon: Abigail Miyamoto, MD;  Location: [facility];  Service: General;  Laterality: Right;   SIMPLE MASTECTOMY WITH AXILLARY SENTINEL NODE BIOPSY Right 11/01/2022   Procedure: RIGHT SIMPLE MASTECTOMY;  Surgeon: Abigail Miyamoto, MD;  Location: [facility];  Service: General;  Laterality: Right;   Patient Active Problem List   Diagnosis Date Noted   S/P mastectomy, right 11/01/2022   Genetic testing 06/05/2022   Neutropenia, drug-induced (HCC) 05/28/2022   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) 03/05/2022    Encounter for screening fecal occult blood testing 03/01/2022   Routine cervical smear 03/01/2022   Axillary adenopathy 02/20/2022   Mass of lower outer quadrant of right breast 01/30/2022   Cat bite of index finger 09/12/2021   Gastroesophageal reflux disease without esophagitis 10/20/2019   GAD (generalized anxiety disorder) 10/20/2019   Insomnia due to medical condition 10/20/2019   Hemiparesis affecting right side as late effect of cerebrovascular accident (CVA) (HCC) 04/22/2015   Diabetes mellitus type II, controlled (HCC) 12/23/2014   Hypertension 12/23/2014    PCP: Mechele Claude, MD  REFERRING PROVIDER: Lillard Anes, NP  REFERRING DIAG:  Diagnosis  C50.811,Z17.1 (ICD-10-CM) - Malignant neoplasm of overlapping sites of right breast in female, estrogen receptor negative (HCC)  C50.811 (ICD-10-CM) - Malignant neoplasm of overlapping sites of right female breast, unspecified estrogen receptor status (HCC)   THERAPY DIAG:  Aftercare following surgery for neoplasm  Difficulty in walking, not elsewhere classified  ONSET DATE: 02/20/22  Rationale for Evaluation and Treatment: Rehabilitation  SUBJECTIVE:  SUBJECTIVE STATEMENT: I have 3 more weeks of radiation.  Its not as bad as I thought it would be.  The only time I have trouble is my balance.  It started with the chemo.  It is not numb.  I do get sharp pains in both feet.  I can't take the gabapentin.   I did have to use a RW and a SPC with the stroke in 2015 and then I got back to walking with nothing.  Then the chemo started and I was back to the cane.  Never the walker.  I feel like my arm is fine for right now.    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 It measures 3.8 cm and is located in the inner lower  quadrant. 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 2 nodes taken at biopsy - 1 was negative the other was positive. Had blood transfusion on 10/05/22. Rt mastectomy and SLNB on 11/01/22 2/6 nodes positive. Currently in radiation and getting Keytruda.  Hx of DVT and stroke in 2015.    PAIN:  Are you having pain? No  PRECAUTIONS: Right lymphedema risk   RED FLAGS: None   WEIGHT BEARING RESTRICTIONS: No  FALLS:  Has patient fallen in last 6 months? No I do have a fear of falling.  I can feel like my legs with crossed up,  LIVING ENVIRONMENT: Lives with: lives with their family - husband and son Lives in: House/apartment Has following equipment at home: Single point cane, Counselling psychologist, Environmental consultant - 2 wheeled, shower chair, and bed side commode  OCCUPATION: on disability since stroke 2015  LEISURE: I get up and down the stairs, trying to walk outside   HAND DOMINANCE: right   PRIOR LEVEL OF FUNCTION: Independent  PATIENT GOALS: Improve my balance - when I get up I sway a little bit      OBJECTIVE: Note: Objective measures were completed at Evaluation unless otherwise noted.  COGNITION: Overall cognitive status: Within functional limits for tasks assessed   SENSATION: WNL with darcofoot testing bil WNL vibration testing  POSTURE: rounded shoulders   MMT: Seated:  knees and ankles 4+/5 Hip Flexion 4+/5 bil Hip abduction 4/5 bil  FUNCTIONAL TESTS:  Reports no trouble with endurance - omitted 6 min walk   BERG: 45/56 See flow sheet for details  37-45 = Significant (>80%) fall risk  Score 44 - 46.5 = patient should use cane indoor   DGI: 22: See flow sheet for detials:  Dynamic Gait Index Scores of 19 or less are predictive of falls in older community living adults    GAIT: Distance walked: in clinic Assistive device utilized: none Level of assistance: Complete  Independence Comments: appearance of Rt knee flexion during stance time, Slight wide BOS  L-DEX LYMPHEDEMA SCREENING: Currently getting SOZO - in the green  TREATMENT DATE:  04/17/24 Eval performed Discussed evaluation findings per above including Assistive device recommendations of  SPC in the community.  Pt reports she knows she should do this and does have a cane.  Discussed radiation skin healing and allowing for this and completion of appointments before starting PT for balance due to daily nature of these.  Discussed walking, posture, and gentle motion until return.     PATIENT EDUCATION:  Education details: per today's note Person educated: Patient Education method: Explanation Education comprehension: verbalized understanding  HOME EXERCISE PROGRAM: Walking, posture  ASSESSMENT:  CLINICAL IMPRESSION: Patient is a 64 y.o. 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  She does have good sensation and vibration testing to bil feet which is encouraging.  She demonstrates difficulty with narrow BOS, eyes closed on foam stance, and has mild weakness in the hips bilaterally.  She will benefit from PT after radiation stops to improve gait and balance.    OBJECTIVE IMPAIRMENTS: Abnormal gait, decreased balance, and difficulty walking.   ACTIVITY LIMITATIONS: locomotion level  PARTICIPATION LIMITATIONS: none  PERSONAL FACTORS: Age, Fitness, Time since onset of injury/illness/exacerbation, and 1-2 comorbidities: chemo hx  are also affecting patient's functional outcome.   REHAB POTENTIAL: Excellent  CLINICAL DECISION MAKING: Stable/uncomplicated  EVALUATION COMPLEXITY: Low  GOALS: Goals reviewed with patient?  Yes  SHORT TERM GOALS=LTGs: Target date: 09/11/23  Pt will  improve BERG balance score to 50/56 to decrease risk of falling  Baseline:45 Goal status: INITIAL  2.  Pt will improve single leg stance to at least 6" on bil legs to decrease risk of falling and improve gait  Baseline: 1-2" bil  Goal status: INITIAL  3.  Pt will be ind with final HEP for continued strength and mobility  Baseline:  Goal status: INITIAL   PLAN:  PT FREQUENCY: 2x/week  PT DURATION: 6 weeks - after radiation ends  PLANNED INTERVENTIONS: [id number]- PT Re-evaluation, 97110-Therapeutic exercises, 97530- Therapeutic activity, 97112- Neuromuscular re-education, 97535- Self Care, 16109- Manual therapy, [id number]- Gait training, Patient/Family education, Balance training, Joint mobilization, Therapeutic exercises, Therapeutic activity, Neuromuscular re-education, Gait training, and Self Care  PLAN FOR NEXT SESSION: how did radiation finish? Arm doing okay? Balance work and general TE  Idamae Lusher, PT 06/21/2023, 9:16 AM

## 2023-06-19 ENCOUNTER — Other Ambulatory Visit: Payer: Self-pay

## 2023-06-19 ENCOUNTER — Ambulatory Visit: Payer: No Typology Code available for payment source | Attending: Adult Health | Admitting: Rehabilitation

## 2023-06-19 ENCOUNTER — Ambulatory Visit
Admission: RE | Admit: 2023-06-19 | Discharge: 2023-06-19 | Disposition: A | Discharge status: Home / Self Care | Payer: No Typology Code available for payment source | Source: Ambulatory Visit | Attending: Radiation Oncology

## 2023-06-19 ENCOUNTER — Encounter: Payer: Self-pay | Admitting: Rehabilitation

## 2023-06-19 DIAGNOSIS — Z483 Aftercare following surgery for neoplasm: Secondary | ICD-10-CM | POA: Diagnosis not present

## 2023-06-19 DIAGNOSIS — R262 Difficulty in walking, not elsewhere classified: Secondary | ICD-10-CM | POA: Diagnosis not present

## 2023-06-19 DIAGNOSIS — Z510 Encounter for antineoplastic radiation therapy: Secondary | ICD-10-CM | POA: Diagnosis not present

## 2023-06-19 DIAGNOSIS — C50811 Malignant neoplasm of overlapping sites of right female breast: Secondary | ICD-10-CM | POA: Diagnosis not present

## 2023-06-19 DIAGNOSIS — Z171 Estrogen receptor negative status [ER-]: Secondary | ICD-10-CM | POA: Diagnosis not present

## 2023-06-19 LAB — RAD ONC ARIA SESSION SUMMARY
Course Elapsed Days: 16
Plan Fractions Treated to Date: 13
Plan Fractions Treated to Date: 7
Plan Prescribed Dose Per Fraction: 1.8 Gy
Plan Prescribed Dose Per Fraction: 1.8 Gy
Plan Total Fractions Prescribed: 14
Plan Total Fractions Prescribed: 28
Plan Total Prescribed Dose: 25.2 Gy
Plan Total Prescribed Dose: 50.4 Gy
Reference Point Dosage Given to Date: 12.6 Gy
Reference Point Dosage Given to Date: 23.4 Gy
Reference Point Session Dosage Given: 1.8 Gy
Reference Point Session Dosage Given: 1.8 Gy
Session Number: 13

## 2023-06-20 ENCOUNTER — Other Ambulatory Visit: Payer: Self-pay

## 2023-06-20 ENCOUNTER — Ambulatory Visit
Admission: RE | Admit: 2023-06-20 | Discharge: 2023-06-20 | Disposition: A | Discharge status: Home / Self Care | Payer: No Typology Code available for payment source | Source: Ambulatory Visit | Attending: Radiation Oncology | Admitting: Radiation Oncology

## 2023-06-20 ENCOUNTER — Ambulatory Visit: Payer: No Typology Code available for payment source | Admitting: Family Medicine

## 2023-06-20 ENCOUNTER — Encounter: Payer: Self-pay | Admitting: Family Medicine

## 2023-06-20 VITALS — BP 134/69 | HR 65 | Temp 97.4°F | Ht 62.0 in | Wt 151.0 lb

## 2023-06-20 DIAGNOSIS — E782 Mixed hyperlipidemia: Secondary | ICD-10-CM

## 2023-06-20 DIAGNOSIS — E118 Type 2 diabetes mellitus with unspecified complications: Secondary | ICD-10-CM

## 2023-06-20 DIAGNOSIS — I10 Essential (primary) hypertension: Secondary | ICD-10-CM | POA: Diagnosis not present

## 2023-06-20 DIAGNOSIS — Z7985 Long-term (current) use of injectable non-insulin antidiabetic drugs: Secondary | ICD-10-CM

## 2023-06-20 DIAGNOSIS — Z510 Encounter for antineoplastic radiation therapy: Secondary | ICD-10-CM | POA: Diagnosis not present

## 2023-06-20 LAB — RAD ONC ARIA SESSION SUMMARY
Course Elapsed Days: 17
Plan Fractions Treated to Date: 14
Plan Fractions Treated to Date: 7
Plan Prescribed Dose Per Fraction: 1.8 Gy
Plan Prescribed Dose Per Fraction: 1.8 Gy
Plan Total Fractions Prescribed: 14
Plan Total Fractions Prescribed: 28
Plan Total Prescribed Dose: 25.2 Gy
Plan Total Prescribed Dose: 50.4 Gy
Reference Point Dosage Given to Date: 12.6 Gy
Reference Point Dosage Given to Date: 25.2 Gy
Reference Point Session Dosage Given: 1.8 Gy
Reference Point Session Dosage Given: 1.8 Gy
Session Number: 14

## 2023-06-20 LAB — BAYER DCA HB A1C WAIVED: HB A1C (BAYER DCA - WAIVED): 4.5 % — ABNORMAL LOW (ref 4.8–5.6)

## 2023-06-20 NOTE — Progress Notes (Signed)
Subjective:  Patient ID: Marilyn Ford, female    DOB: Sep 11, 1960  Age: 63 y.o. MRN: [id number]  CC: Medical Management of Chronic Issues (No concerns at this time. )   HPI Marilyn Ford presents forFollow-up of diabetes. Patient checks blood sugar at home.   100 fasting and 150 postprandial Patient denies symptoms such as polyuria, polydipsia, excessive hunger, nausea No significant hypoglycemic spells noted. Medications reviewed. Pt reports taking them regularly without complication/adverse reaction being reported today.   Continues to take chemo and radiation for breast ca.    History Marilyn Ford has a past medical history of Allergy, Anxiety, Breast cancer (HCC), Diabetes mellitus without complication (HCC), GERD (gastroesophageal reflux disease), Heart murmur, History of kidney stones, Hypertension, Pneumonia, Stroke (HCC), and Vaginal Pap smear, abnormal.   She has a past surgical history that includes Cholecystectomy; Cesarean section; Colonoscopy with propofol (N/A, 07/19/2020); polypectomy (07/19/2020); Portacath placement (N/A, 03/20/2022); Breast biopsy (10/31/2022); Simple mastectomy with axillary sentinel node biopsy (Right, 11/01/2022); and Radioactive seed guided axillary sentinel lymph node (Right, 11/01/2022).   Her family history includes Breast cancer in her maternal aunt; Cancer in her maternal grandmother; Diabetes in her brother and mother; Uterine cancer (age of onset: 64 - 37) in her mother.She reports that she quit smoking about 8 years ago. Her smoking use included cigarettes. She started smoking about 23 years ago. She has a 12.5 pack-year smoking history. She has never used smokeless tobacco. She reports current alcohol use of about 2.0 - 3.0 standard drinks of alcohol per week. She reports that she does not use drugs.  Current Outpatient Medications on File Prior to Visit  Medication Sig Dispense Refill   Alcohol Swabs (B-D SINGLE USE SWABS REGULAR) PADS Test BS  daily and as needed Dx E11.9 100 each 3   aluminum-magnesium hydroxide-simethicone (MAALOX) 200-200-20 MG/5ML SUSP Take 30 mLs by mouth 4 (four) times daily -  before meals and at bedtime. 480 mL 2   amLODipine (NORVASC) 10 MG tablet 1 tablet daily 90 tablet 3   Apple Cider Vinegar 500 MG TABS Take 500 mg by mouth in the morning.     aspirin 81 MG EC tablet Take 1 tablet (81 mg total) by mouth daily. 30 tablet 0   atorvastatin (LIPITOR) 80 MG tablet TAKE 1 TABLET EVERY DAY AT 6PM 90 tablet 3   betamethasone dipropionate 0.05 % cream Apply topically 2 (two) times daily. 30 g 0   Blood Glucose Calibration (TRUE METRIX LEVEL 1) Low SOLN Use with glucometer Dx E11.9 3 each 0   Blood Glucose Monitoring Suppl (TRUE METRIX AIR GLUCOSE METER) w/Device KIT Test BS daily and as needed Dx E11.9 1 kit 0   Calcium Carb-Cholecalciferol (CALCIUM-VITAMIN D) 600-400 MG-UNIT TABS Take 1 tablet by mouth in the morning.     Coenzyme Q10 (COQ10) 100 MG CAPS Take 100 mg by mouth in the morning.     Cranberry 425 MG CAPS Take 425 mg by mouth in the morning and at bedtime.     diphenoxylate-atropine (LOMOTIL) 2.5-0.025 MG tablet Take 1 tablet by mouth 4 (four) times daily as needed for diarrhea or loose stools. 30 tablet 0   Dulaglutide (TRULICITY) 1.5 MG/0.5ML SOAJ Inject 1.5 mg into the skin once a week. 6 mL 4   escitalopram (LEXAPRO) 10 MG tablet Take 1 tablet (10 mg total) by mouth daily. 90 tablet 1   Flaxseed, Linseed, (FLAXSEED OIL) 1000 MG CAPS Take 1,000 mg by mouth in the morning.  glucose blood (TRUE METRIX BLOOD GLUCOSE TEST) test strip Test BS daily and as needed Dx E11.9 100 each 3   icosapent Ethyl (VASCEPA) 1 g capsule Take 2 g by mouth 2 (two) times daily.     Krill Oil 500 MG CAPS Take 3 capsules (1,500 mg total) by mouth in the morning and at bedtime.     lidocaine (XYLOCAINE) 2 % solution Use as directed 15 mLs in the mouth or throat every 6 (six) hours as needed for mouth pain. 250 mL 1    lidocaine-prilocaine (EMLA) cream Apply 1 Application topically as needed. 30 g 0   magnesium oxide (MAG-OX) 400 (240 Mg) MG tablet TAKE 1 TABLET BY MOUTH TWICE A DAY 60 tablet 1   meloxicam (MOBIC) 15 MG tablet TAKE 1 TABLET (15 MG TOTAL) BY MOUTH DAILY FOR JOINT AND MUSCLE PAIN 90 tablet 0   metoprolol (TOPROL-XL) 200 MG 24 hr tablet TAKE 1 TABLET ONE TIME DAILY, WITH OR IMMEDIATELY FOLLOWING A MEAL 90 tablet 3   Multiple Vitamin (MULTIVITAMIN) capsule Take 1 capsule by mouth in the morning.     ondansetron (ZOFRAN) 8 MG tablet Take 1 tablet (8 mg total) by mouth every 8 (eight) hours as needed for nausea or vomiting. 30 tablet 3   pantoprazole (PROTONIX) 40 MG tablet TAKE 1 TABLET twice daily FOR STOMACH 180 tablet 3   traZODone (DESYREL) 150 MG tablet TAKE 1 OR 2 TABLETS AT BEDTIME FOR SLEEP 180 tablet 3   TRUEplus Lancets 33G MISC Test BS daily and as needed Dx E11.9 100 each 3   valsartan (DIOVAN) 320 MG tablet Take 1 tablet (320 mg total) by mouth daily. For blood pressure. 90 tablet 3   zinc gluconate 50 MG tablet Take 50 mg by mouth daily.     Zinc Oxide 20 % PSTE Apply 1 Application topically 3 (three) times daily. 57 g 1   capecitabine (XELODA) 500 MG tablet Take 2 tablets (1,000 mg total) by mouth 2 (two) times daily after a meal. Take for 14 days, then hold for 7 days. Repeat every 21 days. 56 tablet 5   cetirizine (ZYRTEC) 10 MG tablet Take 1 tablet (10 mg total) by mouth daily. 30 tablet 3   gabapentin (NEURONTIN) 300 MG capsule Take 1 capsule (300 mg total) by mouth 2 (two) times daily. 60 capsule 0   LORazepam (ATIVAN) 1 MG tablet Take 1 tablet (1 mg total) by mouth 2 (two) times daily as needed for anxiety. 10 tablet 0   No current facility-administered medications on file prior to visit.    ROS Review of Systems  Constitutional: Negative.   HENT: Negative.    Eyes:  Negative for visual disturbance.  Respiratory:  Negative for shortness of breath.   Cardiovascular:   Negative for chest pain.  Gastrointestinal:  Negative for abdominal pain.  Musculoskeletal:  Negative for arthralgias.    Objective:  BP 134/69   Pulse 65   Temp (!) 97.4 F (36.3 C)   Ht 5\' 2"  (1.575 m)   Wt 151 lb (68.5 kg)   SpO2 100%   BMI 27.62 kg/m   BP Readings from Last 3 Encounters:  06/20/23 134/69  06/03/23 (!) 140/75  05/16/23 (!) 147/67    Wt Readings from Last 3 Encounters:  06/20/23 151 lb (68.5 kg)  06/10/23 150 lb 6 oz (68.2 kg)  06/03/23 151 lb (68.5 kg)     Physical Exam Constitutional:      General: She  is not in acute distress.    Appearance: She is well-developed.  Cardiovascular:     Rate and Rhythm: Normal rate and regular rhythm.  Pulmonary:     Breath sounds: Normal breath sounds.  Musculoskeletal:        General: Normal range of motion.  Skin:    General: Skin is warm and dry.  Neurological:     Mental Status: She is alert and oriented to person, place, and time.       Assessment & Plan:   Marilyn Ford was seen today for medical management of chronic issues.  Diagnoses and all orders for this visit:  Controlled type 2 diabetes mellitus with complication, without long-term current use of insulin (HCC) -     Bayer DCA Hb A1c Waived  Primary hypertension -     Lipid panel  Mixed hyperlipidemia -     Lipid panel      I am having Marilyn Ford maintain her multivitamin, aspirin EC, Flaxseed Oil, Apple Cider Vinegar, Calcium-Vitamin D, zinc gluconate, CoQ10, Cranberry, Krill Oil, True Metrix Air Glucose Meter, True Metrix Blood Glucose Test, TRUEplus Lancets 33G, True Metrix Level 1, B-D SINGLE USE SWABS REGULAR, icosapent Ethyl, amLODipine, atorvastatin, metoprolol, traZODone, magnesium oxide, ondansetron, LORazepam, pantoprazole, diphenoxylate-atropine, Zinc Oxide, valsartan, capecitabine, gabapentin, cetirizine, lidocaine, betamethasone dipropionate, escitalopram, aluminum-magnesium hydroxide-simethicone, lidocaine-prilocaine,  meloxicam, and Trulicity.  No orders of the defined types were placed in this encounter.    Follow-up: No follow-ups on file.  Mechele Claude, M.D.

## 2023-06-21 ENCOUNTER — Ambulatory Visit
Admission: RE | Admit: 2023-06-21 | Discharge: 2023-06-21 | Disposition: A | Discharge status: Home / Self Care | Payer: No Typology Code available for payment source | Source: Ambulatory Visit | Attending: Radiation Oncology | Admitting: Radiation Oncology

## 2023-06-21 ENCOUNTER — Encounter: Payer: Self-pay | Admitting: Family Medicine

## 2023-06-21 ENCOUNTER — Other Ambulatory Visit: Payer: Self-pay

## 2023-06-21 DIAGNOSIS — Z510 Encounter for antineoplastic radiation therapy: Secondary | ICD-10-CM | POA: Diagnosis not present

## 2023-06-21 LAB — RAD ONC ARIA SESSION SUMMARY
Course Elapsed Days: 18
Plan Fractions Treated to Date: 15
Plan Fractions Treated to Date: 8
Plan Prescribed Dose Per Fraction: 1.8 Gy
Plan Prescribed Dose Per Fraction: 1.8 Gy
Plan Total Fractions Prescribed: 14
Plan Total Fractions Prescribed: 28
Plan Total Prescribed Dose: 25.2 Gy
Plan Total Prescribed Dose: 50.4 Gy
Reference Point Dosage Given to Date: 14.4 Gy
Reference Point Dosage Given to Date: 27 Gy
Reference Point Session Dosage Given: 1.8 Gy
Reference Point Session Dosage Given: 1.8 Gy
Session Number: 15

## 2023-06-21 LAB — LIPID PANEL
Chol/HDL Ratio: 3.4 {ratio} (ref 0.0–4.4)
Cholesterol, Total: 171 mg/dL (ref 100–199)
HDL: 51 mg/dL (ref >39)
LDL Chol Calc (NIH): 72 mg/dL (ref 0–99)
Triglycerides: 303 mg/dL — ABNORMAL HIGH (ref 0–149)
VLDL Cholesterol Cal: 48 mg/dL — ABNORMAL HIGH (ref 5–40)

## 2023-06-22 ENCOUNTER — Other Ambulatory Visit: Payer: Self-pay

## 2023-06-24 ENCOUNTER — Ambulatory Visit
Admission: RE | Admit: 2023-06-24 | Discharge: 2023-06-24 | Disposition: A | Discharge status: Home / Self Care | Payer: No Typology Code available for payment source | Source: Ambulatory Visit | Attending: Radiation Oncology | Admitting: Radiation Oncology

## 2023-06-24 ENCOUNTER — Other Ambulatory Visit: Payer: Self-pay

## 2023-06-24 DIAGNOSIS — Z510 Encounter for antineoplastic radiation therapy: Secondary | ICD-10-CM | POA: Diagnosis not present

## 2023-06-24 LAB — RAD ONC ARIA SESSION SUMMARY
Course Elapsed Days: 21
Plan Fractions Treated to Date: 16
Plan Fractions Treated to Date: 8
Plan Prescribed Dose Per Fraction: 1.8 Gy
Plan Prescribed Dose Per Fraction: 1.8 Gy
Plan Total Fractions Prescribed: 14
Plan Total Fractions Prescribed: 28
Plan Total Prescribed Dose: 25.2 Gy
Plan Total Prescribed Dose: 50.4 Gy
Reference Point Dosage Given to Date: 14.4 Gy
Reference Point Dosage Given to Date: 28.8 Gy
Reference Point Session Dosage Given: 1.8 Gy
Reference Point Session Dosage Given: 1.8 Gy
Session Number: 16

## 2023-06-25 ENCOUNTER — Other Ambulatory Visit: Payer: Self-pay

## 2023-06-25 ENCOUNTER — Ambulatory Visit
Admission: RE | Admit: 2023-06-25 | Discharge: 2023-06-25 | Disposition: A | Discharge status: Home / Self Care | Payer: No Typology Code available for payment source | Source: Ambulatory Visit | Attending: Radiation Oncology | Admitting: Radiation Oncology

## 2023-06-25 DIAGNOSIS — Z510 Encounter for antineoplastic radiation therapy: Secondary | ICD-10-CM | POA: Diagnosis not present

## 2023-06-25 LAB — RAD ONC ARIA SESSION SUMMARY
Course Elapsed Days: 22
Plan Fractions Treated to Date: 17
Plan Fractions Treated to Date: 9
Plan Prescribed Dose Per Fraction: 1.8 Gy
Plan Prescribed Dose Per Fraction: 1.8 Gy
Plan Total Fractions Prescribed: 14
Plan Total Fractions Prescribed: 28
Plan Total Prescribed Dose: 25.2 Gy
Plan Total Prescribed Dose: 50.4 Gy
Reference Point Dosage Given to Date: 16.2 Gy
Reference Point Dosage Given to Date: 30.6 Gy
Reference Point Session Dosage Given: 1.8 Gy
Reference Point Session Dosage Given: 1.8 Gy
Session Number: 17

## 2023-06-26 ENCOUNTER — Ambulatory Visit: Payer: No Typology Code available for payment source

## 2023-06-27 ENCOUNTER — Ambulatory Visit: Payer: No Typology Code available for payment source

## 2023-06-28 ENCOUNTER — Ambulatory Visit
Admission: RE | Admit: 2023-06-28 | Discharge: 2023-06-28 | Disposition: A | Discharge status: Home / Self Care | Payer: No Typology Code available for payment source | Source: Ambulatory Visit | Attending: Radiation Oncology | Admitting: Radiation Oncology

## 2023-06-28 ENCOUNTER — Other Ambulatory Visit: Payer: Self-pay

## 2023-06-28 DIAGNOSIS — Z510 Encounter for antineoplastic radiation therapy: Secondary | ICD-10-CM | POA: Diagnosis not present

## 2023-06-28 LAB — RAD ONC ARIA SESSION SUMMARY
Course Elapsed Days: 25
Plan Fractions Treated to Date: 18
Plan Fractions Treated to Date: 9
Plan Prescribed Dose Per Fraction: 1.8 Gy
Plan Prescribed Dose Per Fraction: 1.8 Gy
Plan Total Fractions Prescribed: 14
Plan Total Fractions Prescribed: 28
Plan Total Prescribed Dose: 25.2 Gy
Plan Total Prescribed Dose: 50.4 Gy
Reference Point Dosage Given to Date: 16.2 Gy
Reference Point Dosage Given to Date: 32.4 Gy
Reference Point Session Dosage Given: 1.8 Gy
Reference Point Session Dosage Given: 1.8 Gy
Session Number: 18

## 2023-07-01 ENCOUNTER — Other Ambulatory Visit: Payer: Self-pay

## 2023-07-01 ENCOUNTER — Ambulatory Visit
Admission: RE | Admit: 2023-07-01 | Discharge: 2023-07-01 | Disposition: A | Discharge status: Home / Self Care | Payer: No Typology Code available for payment source | Source: Ambulatory Visit | Attending: Radiation Oncology | Admitting: Radiation Oncology

## 2023-07-01 DIAGNOSIS — Z510 Encounter for antineoplastic radiation therapy: Secondary | ICD-10-CM | POA: Diagnosis not present

## 2023-07-01 LAB — RAD ONC ARIA SESSION SUMMARY
Course Elapsed Days: 28
Plan Fractions Treated to Date: 10
Plan Fractions Treated to Date: 19
Plan Prescribed Dose Per Fraction: 1.8 Gy
Plan Prescribed Dose Per Fraction: 1.8 Gy
Plan Total Fractions Prescribed: 14
Plan Total Fractions Prescribed: 28
Plan Total Prescribed Dose: 25.2 Gy
Plan Total Prescribed Dose: 50.4 Gy
Reference Point Dosage Given to Date: 18 Gy
Reference Point Dosage Given to Date: 34.2 Gy
Reference Point Session Dosage Given: 1.8 Gy
Reference Point Session Dosage Given: 1.8 Gy
Session Number: 19

## 2023-07-02 ENCOUNTER — Other Ambulatory Visit: Payer: Self-pay

## 2023-07-02 ENCOUNTER — Ambulatory Visit
Admission: RE | Admit: 2023-07-02 | Discharge: 2023-07-02 | Disposition: A | Discharge status: Home / Self Care | Payer: No Typology Code available for payment source | Source: Ambulatory Visit | Attending: Radiation Oncology

## 2023-07-02 DIAGNOSIS — Z171 Estrogen receptor negative status [ER-]: Secondary | ICD-10-CM | POA: Diagnosis not present

## 2023-07-02 DIAGNOSIS — Z510 Encounter for antineoplastic radiation therapy: Secondary | ICD-10-CM | POA: Diagnosis not present

## 2023-07-02 DIAGNOSIS — C50811 Malignant neoplasm of overlapping sites of right female breast: Secondary | ICD-10-CM | POA: Diagnosis not present

## 2023-07-02 LAB — RAD ONC ARIA SESSION SUMMARY
Course Elapsed Days: 29
Plan Fractions Treated to Date: 10
Plan Fractions Treated to Date: 20
Plan Prescribed Dose Per Fraction: 1.8 Gy
Plan Prescribed Dose Per Fraction: 1.8 Gy
Plan Total Fractions Prescribed: 14
Plan Total Fractions Prescribed: 28
Plan Total Prescribed Dose: 25.2 Gy
Plan Total Prescribed Dose: 50.4 Gy
Reference Point Dosage Given to Date: 18 Gy
Reference Point Dosage Given to Date: 36 Gy
Reference Point Session Dosage Given: 1.8 Gy
Reference Point Session Dosage Given: 1.8 Gy
Session Number: 20

## 2023-07-03 ENCOUNTER — Other Ambulatory Visit: Payer: Self-pay

## 2023-07-03 ENCOUNTER — Ambulatory Visit
Admission: RE | Admit: 2023-07-03 | Discharge: 2023-07-03 | Disposition: A | Discharge status: Home / Self Care | Payer: No Typology Code available for payment source | Source: Ambulatory Visit | Attending: Radiation Oncology

## 2023-07-03 DIAGNOSIS — Z510 Encounter for antineoplastic radiation therapy: Secondary | ICD-10-CM | POA: Diagnosis not present

## 2023-07-03 DIAGNOSIS — Z171 Estrogen receptor negative status [ER-]: Secondary | ICD-10-CM

## 2023-07-03 LAB — RAD ONC ARIA SESSION SUMMARY
Course Elapsed Days: 30
Plan Fractions Treated to Date: 11
Plan Fractions Treated to Date: 21
Plan Prescribed Dose Per Fraction: 1.8 Gy
Plan Prescribed Dose Per Fraction: 1.8 Gy
Plan Total Fractions Prescribed: 14
Plan Total Fractions Prescribed: 28
Plan Total Prescribed Dose: 25.2 Gy
Plan Total Prescribed Dose: 50.4 Gy
Reference Point Dosage Given to Date: 19.8 Gy
Reference Point Dosage Given to Date: 37.8 Gy
Reference Point Session Dosage Given: 1.8 Gy
Reference Point Session Dosage Given: 1.8 Gy
Session Number: 21

## 2023-07-03 MED ORDER — SONAFINE EX EMUL
1.0000 | Freq: Two times a day (BID) | CUTANEOUS | Status: DC
  Administered 2023-07-03: 1 via TOPICAL

## 2023-07-04 ENCOUNTER — Other Ambulatory Visit: Payer: Self-pay

## 2023-07-04 ENCOUNTER — Ambulatory Visit
Admission: RE | Admit: 2023-07-04 | Discharge: 2023-07-04 | Disposition: A | Discharge status: Home / Self Care | Payer: No Typology Code available for payment source | Source: Ambulatory Visit | Attending: Radiation Oncology

## 2023-07-04 DIAGNOSIS — Z510 Encounter for antineoplastic radiation therapy: Secondary | ICD-10-CM | POA: Diagnosis not present

## 2023-07-04 DIAGNOSIS — Z171 Estrogen receptor negative status [ER-]: Secondary | ICD-10-CM | POA: Diagnosis not present

## 2023-07-04 DIAGNOSIS — C50811 Malignant neoplasm of overlapping sites of right female breast: Secondary | ICD-10-CM | POA: Diagnosis not present

## 2023-07-04 LAB — RAD ONC ARIA SESSION SUMMARY
Course Elapsed Days: 31
Plan Fractions Treated to Date: 11
Plan Fractions Treated to Date: 22
Plan Prescribed Dose Per Fraction: 1.8 Gy
Plan Prescribed Dose Per Fraction: 1.8 Gy
Plan Total Fractions Prescribed: 14
Plan Total Fractions Prescribed: 28
Plan Total Prescribed Dose: 25.2 Gy
Plan Total Prescribed Dose: 50.4 Gy
Reference Point Dosage Given to Date: 19.8 Gy
Reference Point Dosage Given to Date: 39.6 Gy
Reference Point Session Dosage Given: 1.8 Gy
Reference Point Session Dosage Given: 1.8 Gy
Session Number: 22

## 2023-07-05 ENCOUNTER — Ambulatory Visit
Admission: RE | Admit: 2023-07-05 | Discharge: 2023-07-05 | Disposition: A | Discharge status: Home / Self Care | Payer: No Typology Code available for payment source | Source: Ambulatory Visit | Attending: Radiation Oncology | Admitting: Radiation Oncology

## 2023-07-05 ENCOUNTER — Other Ambulatory Visit: Payer: Self-pay

## 2023-07-05 DIAGNOSIS — Z510 Encounter for antineoplastic radiation therapy: Secondary | ICD-10-CM | POA: Diagnosis not present

## 2023-07-05 LAB — RAD ONC ARIA SESSION SUMMARY
Course Elapsed Days: 32
Plan Fractions Treated to Date: 12
Plan Fractions Treated to Date: 23
Plan Prescribed Dose Per Fraction: 1.8 Gy
Plan Prescribed Dose Per Fraction: 1.8 Gy
Plan Total Fractions Prescribed: 14
Plan Total Fractions Prescribed: 28
Plan Total Prescribed Dose: 25.2 Gy
Plan Total Prescribed Dose: 50.4 Gy
Reference Point Dosage Given to Date: 21.6 Gy
Reference Point Dosage Given to Date: 41.4 Gy
Reference Point Session Dosage Given: 1.8 Gy
Reference Point Session Dosage Given: 1.8 Gy
Session Number: 23

## 2023-07-08 ENCOUNTER — Ambulatory Visit
Admission: RE | Admit: 2023-07-08 | Discharge: 2023-07-08 | Disposition: A | Discharge status: Home / Self Care | Payer: No Typology Code available for payment source | Source: Ambulatory Visit | Attending: Radiation Oncology | Admitting: Radiation Oncology

## 2023-07-08 ENCOUNTER — Other Ambulatory Visit: Payer: Self-pay

## 2023-07-08 DIAGNOSIS — Z510 Encounter for antineoplastic radiation therapy: Secondary | ICD-10-CM | POA: Insufficient documentation

## 2023-07-08 DIAGNOSIS — C50811 Malignant neoplasm of overlapping sites of right female breast: Secondary | ICD-10-CM | POA: Insufficient documentation

## 2023-07-08 DIAGNOSIS — Z171 Estrogen receptor negative status [ER-]: Secondary | ICD-10-CM | POA: Insufficient documentation

## 2023-07-08 DIAGNOSIS — Z1732 Human epidermal growth factor receptor 2 negative status: Secondary | ICD-10-CM | POA: Diagnosis not present

## 2023-07-08 DIAGNOSIS — Z1722 Progesterone receptor negative status: Secondary | ICD-10-CM | POA: Diagnosis not present

## 2023-07-08 LAB — RAD ONC ARIA SESSION SUMMARY
Course Elapsed Days: 35
Plan Fractions Treated to Date: 12
Plan Fractions Treated to Date: 24
Plan Prescribed Dose Per Fraction: 1.8 Gy
Plan Prescribed Dose Per Fraction: 1.8 Gy
Plan Total Fractions Prescribed: 14
Plan Total Fractions Prescribed: 28
Plan Total Prescribed Dose: 25.2 Gy
Plan Total Prescribed Dose: 50.4 Gy
Reference Point Dosage Given to Date: 21.6 Gy
Reference Point Dosage Given to Date: 43.2 Gy
Reference Point Session Dosage Given: 1.8 Gy
Reference Point Session Dosage Given: 1.8 Gy
Session Number: 24

## 2023-07-09 ENCOUNTER — Ambulatory Visit
Admission: RE | Admit: 2023-07-09 | Discharge: 2023-07-09 | Disposition: A | Discharge status: Home / Self Care | Payer: No Typology Code available for payment source | Source: Ambulatory Visit | Attending: Radiation Oncology

## 2023-07-09 ENCOUNTER — Other Ambulatory Visit: Payer: Self-pay

## 2023-07-09 ENCOUNTER — Ambulatory Visit: Payer: No Typology Code available for payment source

## 2023-07-09 DIAGNOSIS — Z510 Encounter for antineoplastic radiation therapy: Secondary | ICD-10-CM | POA: Diagnosis not present

## 2023-07-09 DIAGNOSIS — C50811 Malignant neoplasm of overlapping sites of right female breast: Secondary | ICD-10-CM | POA: Diagnosis not present

## 2023-07-09 DIAGNOSIS — Z171 Estrogen receptor negative status [ER-]: Secondary | ICD-10-CM | POA: Diagnosis not present

## 2023-07-09 LAB — RAD ONC ARIA SESSION SUMMARY
Course Elapsed Days: 36
Plan Fractions Treated to Date: 13
Plan Fractions Treated to Date: 25
Plan Prescribed Dose Per Fraction: 1.8 Gy
Plan Prescribed Dose Per Fraction: 1.8 Gy
Plan Total Fractions Prescribed: 14
Plan Total Fractions Prescribed: 28
Plan Total Prescribed Dose: 25.2 Gy
Plan Total Prescribed Dose: 50.4 Gy
Reference Point Dosage Given to Date: 23.4 Gy
Reference Point Dosage Given to Date: 45 Gy
Reference Point Session Dosage Given: 1.8 Gy
Reference Point Session Dosage Given: 1.8 Gy
Session Number: 25

## 2023-07-10 ENCOUNTER — Other Ambulatory Visit: Payer: Self-pay

## 2023-07-10 ENCOUNTER — Ambulatory Visit: Payer: No Typology Code available for payment source

## 2023-07-10 ENCOUNTER — Ambulatory Visit
Admission: RE | Admit: 2023-07-10 | Discharge: 2023-07-10 | Disposition: A | Discharge status: Home / Self Care | Payer: No Typology Code available for payment source | Source: Ambulatory Visit | Attending: Radiation Oncology

## 2023-07-10 DIAGNOSIS — Z510 Encounter for antineoplastic radiation therapy: Secondary | ICD-10-CM | POA: Diagnosis not present

## 2023-07-10 LAB — RAD ONC ARIA SESSION SUMMARY
Course Elapsed Days: 37
Plan Fractions Treated to Date: 13
Plan Fractions Treated to Date: 26
Plan Prescribed Dose Per Fraction: 1.8 Gy
Plan Prescribed Dose Per Fraction: 1.8 Gy
Plan Total Fractions Prescribed: 14
Plan Total Fractions Prescribed: 28
Plan Total Prescribed Dose: 25.2 Gy
Plan Total Prescribed Dose: 50.4 Gy
Reference Point Dosage Given to Date: 23.4 Gy
Reference Point Dosage Given to Date: 46.8 Gy
Reference Point Session Dosage Given: 1.8 Gy
Reference Point Session Dosage Given: 1.8 Gy
Session Number: 26

## 2023-07-11 ENCOUNTER — Ambulatory Visit: Payer: No Typology Code available for payment source

## 2023-07-11 ENCOUNTER — Ambulatory Visit
Admission: RE | Admit: 2023-07-11 | Discharge: 2023-07-11 | Disposition: A | Discharge status: Home / Self Care | Source: Ambulatory Visit | Attending: Radiation Oncology | Admitting: Radiation Oncology

## 2023-07-11 ENCOUNTER — Ambulatory Visit

## 2023-07-11 ENCOUNTER — Other Ambulatory Visit: Payer: Self-pay

## 2023-07-11 DIAGNOSIS — Z510 Encounter for antineoplastic radiation therapy: Secondary | ICD-10-CM | POA: Diagnosis not present

## 2023-07-11 LAB — RAD ONC ARIA SESSION SUMMARY
Course Elapsed Days: 38
Plan Fractions Treated to Date: 14
Plan Fractions Treated to Date: 27
Plan Prescribed Dose Per Fraction: 1.8 Gy
Plan Prescribed Dose Per Fraction: 1.8 Gy
Plan Total Fractions Prescribed: 14
Plan Total Fractions Prescribed: 28
Plan Total Prescribed Dose: 25.2 Gy
Plan Total Prescribed Dose: 50.4 Gy
Reference Point Dosage Given to Date: 25.2 Gy
Reference Point Dosage Given to Date: 48.6 Gy
Reference Point Session Dosage Given: 1.8 Gy
Reference Point Session Dosage Given: 1.8 Gy
Session Number: 27

## 2023-07-12 ENCOUNTER — Ambulatory Visit: Payer: No Typology Code available for payment source

## 2023-07-12 ENCOUNTER — Ambulatory Visit
Admission: RE | Admit: 2023-07-12 | Discharge: 2023-07-12 | Disposition: A | Discharge status: Home / Self Care | Source: Ambulatory Visit | Attending: Radiation Oncology | Admitting: Radiation Oncology

## 2023-07-12 ENCOUNTER — Other Ambulatory Visit: Payer: Self-pay

## 2023-07-12 DIAGNOSIS — Z510 Encounter for antineoplastic radiation therapy: Secondary | ICD-10-CM | POA: Diagnosis not present

## 2023-07-12 LAB — RAD ONC ARIA SESSION SUMMARY
Course Elapsed Days: 39
Plan Fractions Treated to Date: 14
Plan Fractions Treated to Date: 28
Plan Prescribed Dose Per Fraction: 1.8 Gy
Plan Prescribed Dose Per Fraction: 1.8 Gy
Plan Total Fractions Prescribed: 14
Plan Total Fractions Prescribed: 28
Plan Total Prescribed Dose: 25.2 Gy
Plan Total Prescribed Dose: 50.4 Gy
Reference Point Dosage Given to Date: 25.2 Gy
Reference Point Dosage Given to Date: 50.4 Gy
Reference Point Session Dosage Given: 1.8 Gy
Reference Point Session Dosage Given: 1.8 Gy
Session Number: 28

## 2023-07-15 ENCOUNTER — Ambulatory Visit: Payer: No Typology Code available for payment source

## 2023-07-15 ENCOUNTER — Ambulatory Visit
Admission: RE | Admit: 2023-07-15 | Discharge: 2023-07-15 | Disposition: A | Discharge status: Home / Self Care | Source: Ambulatory Visit | Attending: Radiation Oncology | Admitting: Radiation Oncology

## 2023-07-15 ENCOUNTER — Other Ambulatory Visit: Payer: Self-pay

## 2023-07-15 ENCOUNTER — Telehealth: Payer: Self-pay | Admitting: Radiation Oncology

## 2023-07-15 DIAGNOSIS — Z510 Encounter for antineoplastic radiation therapy: Secondary | ICD-10-CM | POA: Diagnosis not present

## 2023-07-15 LAB — RAD ONC ARIA SESSION SUMMARY
Course Elapsed Days: 42
Plan Fractions Treated to Date: 1
Plan Prescribed Dose Per Fraction: 2 Gy
Plan Total Fractions Prescribed: 5
Plan Total Prescribed Dose: 10 Gy
Reference Point Dosage Given to Date: 2 Gy
Reference Point Session Dosage Given: 2 Gy
Session Number: 29

## 2023-07-15 NOTE — Telephone Encounter (Signed)
 3/10 @ 9:36 am Patient called to confirm today's appt time for her treatments.

## 2023-07-16 ENCOUNTER — Ambulatory Visit: Payer: No Typology Code available for payment source

## 2023-07-16 ENCOUNTER — Other Ambulatory Visit: Payer: Self-pay

## 2023-07-16 ENCOUNTER — Ambulatory Visit
Admission: RE | Admit: 2023-07-16 | Discharge: 2023-07-16 | Disposition: A | Discharge status: Home / Self Care | Source: Ambulatory Visit | Attending: Radiation Oncology | Admitting: Radiation Oncology

## 2023-07-16 ENCOUNTER — Ambulatory Visit

## 2023-07-16 DIAGNOSIS — Z510 Encounter for antineoplastic radiation therapy: Secondary | ICD-10-CM | POA: Diagnosis not present

## 2023-07-16 LAB — RAD ONC ARIA SESSION SUMMARY
Course Elapsed Days: 43
Plan Fractions Treated to Date: 2
Plan Prescribed Dose Per Fraction: 2 Gy
Plan Total Fractions Prescribed: 5
Plan Total Prescribed Dose: 10 Gy
Reference Point Dosage Given to Date: 4 Gy
Reference Point Session Dosage Given: 2 Gy
Session Number: 30

## 2023-07-17 ENCOUNTER — Ambulatory Visit

## 2023-07-17 ENCOUNTER — Ambulatory Visit: Payer: No Typology Code available for payment source

## 2023-07-17 ENCOUNTER — Ambulatory Visit
Admission: RE | Admit: 2023-07-17 | Discharge: 2023-07-17 | Disposition: A | Discharge status: Home / Self Care | Source: Ambulatory Visit | Attending: Radiation Oncology

## 2023-07-17 ENCOUNTER — Other Ambulatory Visit: Payer: Self-pay

## 2023-07-17 DIAGNOSIS — Z510 Encounter for antineoplastic radiation therapy: Secondary | ICD-10-CM | POA: Diagnosis not present

## 2023-07-17 LAB — RAD ONC ARIA SESSION SUMMARY
Course Elapsed Days: 44
Plan Fractions Treated to Date: 3
Plan Prescribed Dose Per Fraction: 2 Gy
Plan Total Fractions Prescribed: 5
Plan Total Prescribed Dose: 10 Gy
Reference Point Dosage Given to Date: 6 Gy
Reference Point Session Dosage Given: 2 Gy
Session Number: 31

## 2023-07-18 ENCOUNTER — Other Ambulatory Visit: Payer: Self-pay

## 2023-07-18 ENCOUNTER — Ambulatory Visit: Payer: No Typology Code available for payment source

## 2023-07-18 ENCOUNTER — Ambulatory Visit
Admission: RE | Admit: 2023-07-18 | Discharge: 2023-07-18 | Discharge status: Home / Self Care | Source: Ambulatory Visit | Attending: Radiation Oncology

## 2023-07-18 ENCOUNTER — Ambulatory Visit

## 2023-07-18 DIAGNOSIS — Z510 Encounter for antineoplastic radiation therapy: Secondary | ICD-10-CM | POA: Diagnosis not present

## 2023-07-18 LAB — RAD ONC ARIA SESSION SUMMARY
Course Elapsed Days: 45
Plan Fractions Treated to Date: 4
Plan Prescribed Dose Per Fraction: 2 Gy
Plan Total Fractions Prescribed: 5
Plan Total Prescribed Dose: 10 Gy
Reference Point Dosage Given to Date: 8 Gy
Reference Point Session Dosage Given: 2 Gy
Session Number: 32

## 2023-07-19 ENCOUNTER — Other Ambulatory Visit: Payer: Self-pay

## 2023-07-19 ENCOUNTER — Ambulatory Visit

## 2023-07-19 ENCOUNTER — Ambulatory Visit: Payer: No Typology Code available for payment source

## 2023-07-19 ENCOUNTER — Ambulatory Visit
Admission: RE | Admit: 2023-07-19 | Discharge: 2023-07-19 | Disposition: A | Discharge status: Home / Self Care | Source: Ambulatory Visit | Attending: Radiation Oncology | Admitting: Radiation Oncology

## 2023-07-19 DIAGNOSIS — Z171 Estrogen receptor negative status [ER-]: Secondary | ICD-10-CM | POA: Diagnosis not present

## 2023-07-19 DIAGNOSIS — C50811 Malignant neoplasm of overlapping sites of right female breast: Secondary | ICD-10-CM | POA: Diagnosis not present

## 2023-07-19 DIAGNOSIS — Z510 Encounter for antineoplastic radiation therapy: Secondary | ICD-10-CM | POA: Diagnosis not present

## 2023-07-19 LAB — RAD ONC ARIA SESSION SUMMARY
Course Elapsed Days: 46
Plan Fractions Treated to Date: 5
Plan Prescribed Dose Per Fraction: 2 Gy
Plan Total Fractions Prescribed: 5
Plan Total Prescribed Dose: 10 Gy
Reference Point Dosage Given to Date: 10 Gy
Reference Point Session Dosage Given: 2 Gy
Session Number: 33

## 2023-07-22 ENCOUNTER — Ambulatory Visit

## 2023-07-22 ENCOUNTER — Telehealth: Payer: Self-pay | Admitting: Family Medicine

## 2023-07-22 NOTE — Radiation Completion Notes (Addendum)
  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   Intent: Curative     ==========DELIVERED PLANS==========  First Treatment Date: 2023-06-03 Last Treatment Date: 2023-07-19   Plan Name: CW_R_BO Site: Chest Wall, Right Technique: 3D Mode: Photon Dose Per Fraction: 1.8 Gy Prescribed Dose (Delivered / Prescribed): 25.2 Gy / 25.2 Gy Prescribed Fxs (Delivered / Prescribed): 14 / 14   Plan Name: CW_R_PAB_SCV Site: Chest Wall, Right Technique: 3D Mode: Photon Dose Per Fraction: 1.8 Gy Prescribed Dose (Delivered / Prescribed): 50.4 Gy / 50.4 Gy Prescribed Fxs (Delivered / Prescribed): 28 / 28   Plan Name: CW_R_Bst Site: Chest Wall, Right Technique: Electron Mode: Electron Dose Per Fraction: 2 Gy Prescribed Dose (Delivered / Prescribed): 10 Gy / 10 Gy Prescribed Fxs (Delivered / Prescribed): 5 / 5   Plan Name: CW_R Site: Chest Wall, Right Technique: 3D Mode: Photon Dose Per Fraction: 1.8 Gy Prescribed Dose (Delivered / Prescribed): 25.2 Gy / 25.2 Gy Prescribed Fxs (Delivered / Prescribed): 14 / 14     ==========ON TREATMENT VISIT DATES========== 2023-06-07, 2023-06-14, 2023-06-20, 2023-06-28, 2023-07-05, 2023-07-12, 2023-07-19   See weekly On Treatment Notes in Epic for details in the Media tab (listed as Progress notes on the On Treatment Visit Dates listed above).The patient tolerated radiation. She developed fatigue and anticipated skin changes in the treatment field.   The patient will receive a call in about one month from the radiation oncology department. She will continue follow up with Dr. Ellin Saba as well.      Osker Mason, PAC

## 2023-07-24 ENCOUNTER — Other Ambulatory Visit: Payer: Self-pay | Admitting: Family Medicine

## 2023-07-24 DIAGNOSIS — C50811 Malignant neoplasm of overlapping sites of right female breast: Secondary | ICD-10-CM

## 2023-07-24 NOTE — Telephone Encounter (Signed)
Referral placed, as requested WS 

## 2023-07-31 ENCOUNTER — Other Ambulatory Visit: Payer: Self-pay | Admitting: Family Medicine

## 2023-07-31 ENCOUNTER — Ambulatory Visit: Payer: No Typology Code available for payment source | Attending: Radiation Oncology

## 2023-07-31 ENCOUNTER — Other Ambulatory Visit: Payer: Self-pay

## 2023-07-31 DIAGNOSIS — R262 Difficulty in walking, not elsewhere classified: Secondary | ICD-10-CM | POA: Diagnosis not present

## 2023-07-31 DIAGNOSIS — E118 Type 2 diabetes mellitus with unspecified complications: Secondary | ICD-10-CM

## 2023-07-31 DIAGNOSIS — I10 Essential (primary) hypertension: Secondary | ICD-10-CM

## 2023-07-31 DIAGNOSIS — E782 Mixed hyperlipidemia: Secondary | ICD-10-CM

## 2023-07-31 DIAGNOSIS — Z483 Aftercare following surgery for neoplasm: Secondary | ICD-10-CM | POA: Diagnosis not present

## 2023-07-31 DIAGNOSIS — C50811 Malignant neoplasm of overlapping sites of right female breast: Secondary | ICD-10-CM

## 2023-07-31 DIAGNOSIS — G4701 Insomnia due to medical condition: Secondary | ICD-10-CM

## 2023-07-31 NOTE — Patient Instructions (Signed)
 Heel Raise: Bilateral (Standing)   Cancer Rehab [phone]    Stand near counter for fingertip support if needed. Rise on balls of feet.Then try with one leg at a time.  Repeat __10-20__ times per set. Do _1-2___ sets per session. Do __2__ sessions per day.  SINGLE LIMB STANCE    Stand at counter (corner if you have one) for minimal arm support. Raise leg. Hold _10-20__ seconds. Repeat with other leg. Once this becomes easier, for increased challenge, close eyes with fingertips on counter for safety. _3-5__ reps per set, _2-3__ sets per day.   Tandem Stance    Stand at counter (corner if you have one). Right foot in front of left, heel touching toe both feet "straight ahead". Stand on Foot Triangle of Support with both feet. Balance in this position _10-20__ seconds. Do with left foot in front of right. Once this becomes easier, for increased challenge, close eyes with fingertips on counter for safety.  Step-Up: Forward    Move step-stool to counter or hold rail if at steps, but as little as able. Step up forward keeping opposite foot off step. Keep pelvis level and back straight. Hold the single leg stand for 3 seconds, then come back down slowly.  Do _10__ times, do _1-2_ sets, on each leg, _1-2__ times per day.         HIP: Flexion Standing    Holding onto counter lift leg straight in front keeping knee straight. Slow and controlled! _10__ reps per set, _2-3__ sets per day. Then repeat with other leg.   Hip Extension (Standing)    Stand with support at counter. Squeeze pelvic floor and hold so as not to twist hips and don't lean forward.  Move right leg backward with straight knee. Slow and controlled! Repeat _10__ times. Do _2-3__ times a day. Repeat with other leg.  Hip Abduction (Standing)    Stand with support. Squeeze pelvic floor and hold. Lift right leg out to side, keeping toe forward.  Repeat _10__ times. Do _2-3__ times a day. Repeat with other leg.     Hamstring Stretch    Sit on edge of chair. Inhale and straighten spine. Exhale and lean forward toward extended leg. Hold position for _3__ breaths. Inhale and come back to center. Repeat with other leg extended. Repeat _3__ times, alternating legs. Do _2-3__ times per day.  Gastroc Stretch    Stand with right foot back, leg straight, forward leg bent. Keeping heel on floor, turned slightly out, lean into wall until stretch is felt in calf. Hold _10-20___ seconds. Repeat __3__ times per set.  Do _2-3___ sessions per day.

## 2023-07-31 NOTE — Therapy (Signed)
 OUTPATIENT PHYSICAL THERAPY   ONCOLOGY TREATMENT  Patient Name: Marilyn Ford MRN: [id number] DOB:[birth year], 63 y.o., female Today's Date: 07/31/2023  END OF SESSION:  PT End of Session - 07/31/23 1012     Visit Number 2    Number of Visits 13    Date for PT Re-Evaluation 09/11/23    PT Start Time 1008    PT Stop Time 1055    PT Time Calculation (min) 47 min    Activity Tolerance Patient tolerated treatment well    Behavior During Therapy WFL for tasks assessed/performed              Past Medical History:  Diagnosis Date   Allergy    Anxiety    Breast cancer (HCC)    Diabetes mellitus without complication (HCC)    GERD (gastroesophageal reflux disease)    Heart murmur    as a child   History of kidney stones    Hypertension    Pneumonia    Stroke [facility])    Vaginal Pap smear, abnormal    Past Surgical History:  Procedure Laterality Date   BREAST BIOPSY  10/31/2022   [place] RT RADIOACTIVE SEED LOC 10/31/2022 [facility]   CESAREAN SECTION     CHOLECYSTECTOMY     COLONOSCOPY WITH PROPOFOL N/A 07/19/2020   Procedure: COLONOSCOPY WITH PROPOFOL;  Surgeon: Dolores Frame, MD;  [facility];  Service: Gastroenterology;  Laterality: N/A;  AM   POLYPECTOMY  07/19/2020   Procedure: POLYPECTOMY;  Surgeon: Dolores Frame, MD;  [facility];  Service: Gastroenterology;;   PORTACATH PLACEMENT N/A 03/20/2022   Procedure: INSERTION PORT-A-CATH;  Surgeon: Abigail Miyamoto, MD;  Location: [facility];  Service: General;  Laterality: N/A;   RADIOACTIVE SEED GUIDED AXILLARY SENTINEL LYMPH NODE Right 11/01/2022   Procedure: RADIOACTIVE SEED GUIDED RIGHT AXILLARY SENTINEL LYMPH NODE DISSECTION;  Surgeon: Abigail Miyamoto, MD;  Location: [facility];  Service: General;  Laterality: Right;   SIMPLE MASTECTOMY WITH AXILLARY SENTINEL NODE BIOPSY Right 11/01/2022   Procedure: RIGHT SIMPLE MASTECTOMY;  Surgeon: Abigail Miyamoto, MD;   Location: [facility];  Service: General;  Laterality: Right;   Patient Active Problem List   Diagnosis Date Noted   S/P mastectomy, right 11/01/2022   Genetic testing 06/05/2022   Neutropenia, drug-induced (HCC) 05/28/2022   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) 03/05/2022   Encounter for screening fecal occult blood testing 03/01/2022   Routine cervical smear 03/01/2022   Axillary adenopathy 02/20/2022   Mass of lower outer quadrant of right breast 01/30/2022   Cat bite of index finger 09/12/2021   Gastroesophageal reflux disease without esophagitis 10/20/2019   GAD (generalized anxiety disorder) 10/20/2019   Insomnia due to medical condition 10/20/2019   Hemiparesis affecting right side as late effect of cerebrovascular accident (CVA) (HCC) 04/22/2015   Diabetes mellitus type II, controlled (HCC) 12/23/2014   Hypertension 12/23/2014    PCP: Mechele Claude, MD  REFERRING PROVIDER: Lillard Anes, NP  REFERRING DIAG:  Diagnosis  C50.811,Z17.1 (ICD-10-CM) - Malignant neoplasm of overlapping sites of right breast in female, estrogen receptor negative (HCC)  C50.811 (ICD-10-CM) - Malignant neoplasm of overlapping sites of right female breast, unspecified estrogen receptor status (HCC)   THERAPY DIAG:  Aftercare following surgery for neoplasm  Difficulty in walking, not elsewhere classified  ONSET DATE: 02/20/22  Rationale for Evaluation and Treatment: Rehabilitation  SUBJECTIVE:  SUBJECTIVE STATEMENT: I finished radiation about a week and a half ago. My balance is my biggest issue right now. I'm going to keep working on my Rt shoulder at home.  I stopped taking the gabapentin because it was making me feel crazy.   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 It measures 3.8 cm and is located in the inner lower quadrant. 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 2 nodes taken at biopsy - 1 was negative the other was positive. Had blood transfusion on 10/05/22. Rt mastectomy and SLNB on 11/01/22 2/6 nodes positive. Currently in radiation and getting Keytruda.  Hx of DVT and stroke in 2015.    PAIN:  Are you having pain? No  PRECAUTIONS: Right lymphedema risk   RED FLAGS: None   WEIGHT BEARING RESTRICTIONS: No  FALLS:  Has patient fallen in last 6 months? No I do have a fear of falling.  I can feel like my legs with crossed up,  LIVING ENVIRONMENT: Lives with: lives with their family - husband and son Lives in: House/apartment Has following equipment at home: Single point cane, Counselling psychologist, Environmental consultant - 2 wheeled, shower chair, and bed side commode  OCCUPATION: on disability since stroke 2015  LEISURE: I get up and down the stairs, trying to walk outside   HAND DOMINANCE: right   PRIOR LEVEL OF FUNCTION: Independent  PATIENT GOALS: Improve my balance - when I get up I sway a little bit      OBJECTIVE: Note: Objective measures were completed at Evaluation unless otherwise noted.  COGNITION: Overall cognitive status: Within functional limits for tasks assessed   SENSATION: WNL with darcofoot testing bil WNL vibration testing  POSTURE: rounded shoulders   MMT: Seated:  knees and ankles 4+/5 Hip Flexion 4+/5 bil Hip abduction 4/5 bil  FUNCTIONAL TESTS:  Reports no trouble with endurance - omitted 6 min walk   BERG: 45/56 See flow sheet for details  37-45 = Significant (>80%) fall risk  Score 44 - 46.5 = patient should use cane indoor   DGI: 22: See flow sheet for detials:  Dynamic Gait Index Scores of 19 or less are predictive of falls in older community living adults    GAIT: Distance  walked: in clinic Assistive device utilized: none Level of assistance: Complete Independence Comments: appearance of Rt knee flexion during stance time, Slight wide BOS  L-DEX LYMPHEDEMA SCREENING: Currently getting SOZO - in the green                                                                                                                            TREATMENT DATE:  07/31/23: Therapeutic Exercises NuStep Level 4 UE 9/LE 5 x 7 mins, 270 steps. Discussed importance of daily walking routine with pt and encouraged her to resume this, even if she needs to start in  her hallway until she feels safe to walk outside again.  Bil gastroc stretch x 2, 20 sec each in lunge position, next seated HS stretch x 2, 20 sec holds Neuro Re Ed In // bars: Front and retro heel-toe walking x 2 laps, 6" step up x 10 each leg with 3 sec SLS with each rep; bil tandem stance practice x 30", bil heel raises x 15, then SLS heel raise: challenging on Rt so Lt toe down for stability, then Lt x 10 all done with fingertip support and returning therapist demo.  Back to // bars for bil hip 3 way raises into flex, abd and ext x 10 each returning therapist demo and VC's for correct UE position. Handout issued for all exs done today.  06/19/23 Eval performed Discussed evaluation findings per above including Assistive device recommendations of  SPC in the community.  Pt reports she knows she should do this and does have a cane.  Discussed radiation skin healing and allowing for this and completion of appointments before starting PT for balance due to daily nature of these.  Discussed walking, posture, and gentle motion until return.     PATIENT EDUCATION:  Education details: Standing bil LE strength and balance Person educated: Patient Education method: Explanation, demonstration, and handouts issued Education comprehension: verbalized understanding, returned demonstration, VC's and will benefit from further  review  HOME EXERCISE PROGRAM: Walking, posture 07/31/23 - Standing bil LE strength and balance activities  ASSESSMENT:  CLINICAL IMPRESSION: Patient returns to physical therapy after completing radiation and is ready to resume working on her balance. Her SLS has slightly improved from 1-2" to 2-3" at counter. Progressed her HEP to include balance activities that she was challenged by at clinic today. Also encouraged pt to work on resuming her daily walking, even if she has to start with 5 mins, 3x/day for now due to balance and until she feels safe walking outside again on more uneven terrain. Pt able to verbalize good understanding of all.    OBJECTIVE IMPAIRMENTS: Abnormal gait, decreased balance, and difficulty walking.   ACTIVITY LIMITATIONS: locomotion level  PARTICIPATION LIMITATIONS: none  PERSONAL FACTORS: Age, Fitness, Time since onset of injury/illness/exacerbation, and 1-2 comorbidities: chemo hx  are also affecting patient's functional outcome.   REHAB POTENTIAL: Excellent  CLINICAL DECISION MAKING: Stable/uncomplicated  EVALUATION COMPLEXITY: Low  GOALS: Goals reviewed with patient? Yes  SHORT TERM GOALS=LTGs: Target date: 09/11/23  Pt will  improve BERG balance score to 50/56 to decrease risk of falling  Baseline:45 Goal status: INITIAL  2.  Pt will improve single leg stance to at least 6" on bil legs to decrease risk of falling and improve gait  Baseline: 1-2" bil; 07/31/23 - 2-3" bil Goal status: ONGOING  3.  Pt will be ind with final HEP for continued strength and mobility  Baseline:  Goal status: INITIAL   PLAN:  PT FREQUENCY: 2x/week  PT DURATION: 6 weeks - after radiation ends  PLANNED INTERVENTIONS: 97164- PT Re-evaluation, 97110-Therapeutic exercises, 97530- Therapeutic activity, 97112- Neuromuscular re-education, 97535- Self Care, 29562- Manual therapy, [phone]- Gait training, Patient/Family education, Balance training, Joint mobilization,  Therapeutic exercises, Therapeutic activity, Neuromuscular re-education, Gait training, and Self Care  PLAN FOR NEXT SESSION: Cont balance work and general TE. Review HEP issued today assessing her technique and answering any pt questions.   Hermenia Bers, PTA 07/31/2023, 11:01 AM

## 2023-08-02 ENCOUNTER — Other Ambulatory Visit: Payer: Self-pay | Admitting: Family Medicine

## 2023-08-02 DIAGNOSIS — K219 Gastro-esophageal reflux disease without esophagitis: Secondary | ICD-10-CM

## 2023-08-05 ENCOUNTER — Inpatient Hospital Stay: Payer: No Typology Code available for payment source | Attending: Hematology and Oncology

## 2023-08-05 VITALS — BP 148/85 | HR 75 | Temp 98.2°F | Resp 18

## 2023-08-05 DIAGNOSIS — C50811 Malignant neoplasm of overlapping sites of right female breast: Secondary | ICD-10-CM | POA: Diagnosis not present

## 2023-08-05 DIAGNOSIS — Z1722 Progesterone receptor negative status: Secondary | ICD-10-CM | POA: Diagnosis not present

## 2023-08-05 DIAGNOSIS — Z1732 Human epidermal growth factor receptor 2 negative status: Secondary | ICD-10-CM | POA: Diagnosis not present

## 2023-08-05 DIAGNOSIS — Z171 Estrogen receptor negative status [ER-]: Secondary | ICD-10-CM | POA: Insufficient documentation

## 2023-08-05 DIAGNOSIS — D72819 Decreased white blood cell count, unspecified: Secondary | ICD-10-CM | POA: Insufficient documentation

## 2023-08-05 DIAGNOSIS — Z95828 Presence of other vascular implants and grafts: Secondary | ICD-10-CM

## 2023-08-05 DIAGNOSIS — D696 Thrombocytopenia, unspecified: Secondary | ICD-10-CM | POA: Insufficient documentation

## 2023-08-05 LAB — COMPREHENSIVE METABOLIC PANEL WITH GFR
ALT: 41 U/L (ref 0–44)
AST: 39 U/L (ref 15–41)
Albumin: 3.9 g/dL (ref 3.5–5.0)
Alkaline Phosphatase: 126 U/L (ref 38–126)
Anion gap: 10 (ref 5–15)
BUN: 18 mg/dL (ref 8–23)
CO2: 25 mmol/L (ref 22–32)
Calcium: 9.2 mg/dL (ref 8.9–10.3)
Chloride: 101 mmol/L (ref 98–111)
Creatinine, Ser: 0.99 mg/dL (ref 0.44–1.00)
GFR, Estimated: >60 mL/min (ref >60)
Glucose, Bld: 175 mg/dL — ABNORMAL HIGH (ref 70–99)
Potassium: 4 mmol/L (ref 3.5–5.1)
Sodium: 136 mmol/L (ref 135–145)
Total Bilirubin: 1.1 mg/dL (ref 0.0–1.2)
Total Protein: 7.1 g/dL (ref 6.5–8.1)

## 2023-08-05 LAB — CBC WITH DIFFERENTIAL/PLATELET
Abs Immature Granulocytes: 0 10*3/uL (ref 0.00–0.07)
Basophils Absolute: 0 10*3/uL (ref 0.0–0.1)
Basophils Relative: 1 %
Eosinophils Absolute: 0.1 10*3/uL (ref 0.0–0.5)
Eosinophils Relative: 2 %
HCT: 31 % — ABNORMAL LOW (ref 36.0–46.0)
Hemoglobin: 10.8 g/dL — ABNORMAL LOW (ref 12.0–15.0)
Immature Granulocytes: 0 %
Lymphocytes Relative: 17 %
Lymphs Abs: 0.5 10*3/uL — ABNORMAL LOW (ref 0.7–4.0)
MCH: 35.9 pg — ABNORMAL HIGH (ref 26.0–34.0)
MCHC: 34.8 g/dL (ref 30.0–36.0)
MCV: 103 fL — ABNORMAL HIGH (ref 80.0–100.0)
Monocytes Absolute: 0.4 10*3/uL (ref 0.1–1.0)
Monocytes Relative: 11 %
Neutro Abs: 2.2 10*3/uL (ref 1.7–7.7)
Neutrophils Relative %: 69 %
Platelets: 100 10*3/uL — ABNORMAL LOW (ref 150–400)
RBC: 3.01 MIL/uL — ABNORMAL LOW (ref 3.87–5.11)
RDW: 13.1 % (ref 11.5–15.5)
WBC: 3.2 10*3/uL — ABNORMAL LOW (ref 4.0–10.5)
nRBC: 0 % (ref 0.0–0.2)

## 2023-08-05 LAB — MAGNESIUM: Magnesium: 1.8 mg/dL (ref 1.7–2.4)

## 2023-08-05 MED ORDER — HEPARIN SOD (PORK) LOCK FLUSH 100 UNIT/ML IV SOLN
500.0000 [IU] | Freq: Once | INTRAVENOUS | Status: AC
  Administered 2023-08-05: 500 [IU] via INTRAVENOUS

## 2023-08-05 MED ORDER — SODIUM CHLORIDE 0.9% FLUSH
10.0000 mL | INTRAVENOUS | Status: DC | PRN
  Administered 2023-08-05: 10 mL via INTRAVENOUS

## 2023-08-05 NOTE — Progress Notes (Signed)
 Patients port flushed without difficulty.  Good blood return noted with no bruising or swelling noted at site.  Band aid applied.  VSS with discharge and left in satisfactory condition with no s/s of distress noted.

## 2023-08-06 ENCOUNTER — Ambulatory Visit: Payer: No Typology Code available for payment source | Attending: Radiation Oncology

## 2023-08-06 DIAGNOSIS — R262 Difficulty in walking, not elsewhere classified: Secondary | ICD-10-CM | POA: Diagnosis not present

## 2023-08-06 DIAGNOSIS — Z483 Aftercare following surgery for neoplasm: Secondary | ICD-10-CM | POA: Insufficient documentation

## 2023-08-06 LAB — CANCER ANTIGEN 27.29: CA 27.29: 35.7 U/mL (ref 0.0–38.6)

## 2023-08-06 LAB — CANCER ANTIGEN 15-3: CA 15-3: 33.4 U/mL — ABNORMAL HIGH (ref 0.0–25.0)

## 2023-08-06 NOTE — Therapy (Signed)
 OUTPATIENT PHYSICAL THERAPY   ONCOLOGY TREATMENT  Patient Name: Marilyn Ford MRN: [id number] DOB:05/19/1960, 63 y.o., female Today's Date: 08/06/2023  END OF SESSION:  PT End of Session - 08/06/23 1011     Visit Number 3    Number of Visits 13    Date for PT Re-Evaluation 09/11/23    PT Start Time 1006    PT Stop Time 1050    PT Time Calculation (min) 44 min    Activity Tolerance Patient tolerated treatment well              Past Medical History:  Diagnosis Date   Allergy    Anxiety    Breast cancer (HCC)    Diabetes mellitus without complication (HCC)    GERD (gastroesophageal reflux disease)    Heart murmur    as a child   History of kidney stones    Hypertension    Pneumonia    Stroke [facility])    Vaginal Pap smear, abnormal    Past Surgical History:  Procedure Laterality Date   BREAST BIOPSY  10/31/2022   [place] RT RADIOACTIVE SEED LOC 10/31/2022 [facility]   CESAREAN SECTION     CHOLECYSTECTOMY     COLONOSCOPY WITH PROPOFOL N/A 07/19/2020   Procedure: COLONOSCOPY WITH PROPOFOL;  Surgeon: Dolores Frame, MD;  [facility];  Service: Gastroenterology;  Laterality: N/A;  AM   POLYPECTOMY  07/19/2020   Procedure: POLYPECTOMY;  Surgeon: Dolores Frame, MD;  [facility];  Service: Gastroenterology;;   PORTACATH PLACEMENT N/A 03/20/2022   Procedure: INSERTION PORT-A-CATH;  Surgeon: Abigail Miyamoto, MD;  Location: [facility];  Service: General;  Laterality: N/A;   RADIOACTIVE SEED GUIDED AXILLARY SENTINEL LYMPH NODE Right 11/01/2022   Procedure: RADIOACTIVE SEED GUIDED RIGHT AXILLARY SENTINEL LYMPH NODE DISSECTION;  Surgeon: Abigail Miyamoto, MD;  Location: [facility];  Service: General;  Laterality: Right;   SIMPLE MASTECTOMY WITH AXILLARY SENTINEL NODE BIOPSY Right 11/01/2022   Procedure: RIGHT SIMPLE MASTECTOMY;  Surgeon: Abigail Miyamoto, MD;  Location: [facility];  Service: General;   Laterality: Right;   Patient Active Problem List   Diagnosis Date Noted   S/P mastectomy, right 11/01/2022   Genetic testing 06/05/2022   Neutropenia, drug-induced (HCC) 05/28/2022   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) 03/05/2022   Encounter for screening fecal occult blood testing 03/01/2022   Routine cervical smear 03/01/2022   Axillary adenopathy 02/20/2022   Mass of lower outer quadrant of right breast 01/30/2022   Cat bite of index finger 09/12/2021   Gastroesophageal reflux disease without esophagitis 10/20/2019   GAD (generalized anxiety disorder) 10/20/2019   Insomnia due to medical condition 10/20/2019   Hemiparesis affecting right side as late effect of cerebrovascular accident (CVA) (HCC) 04/22/2015   Diabetes mellitus type II, controlled (HCC) 12/23/2014   Hypertension 12/23/2014    PCP: Mechele Claude, MD  REFERRING PROVIDER: Lillard Anes, NP  REFERRING DIAG:  Diagnosis  C50.811,Z17.1 (ICD-10-CM) - Malignant neoplasm of overlapping sites of right breast in female, estrogen receptor negative (HCC)  C50.811 (ICD-10-CM) - Malignant neoplasm of overlapping sites of right female breast, unspecified estrogen receptor status (HCC)   THERAPY DIAG:  Aftercare following surgery for neoplasm  Difficulty in walking, not elsewhere classified  ONSET DATE: 02/20/22  Rationale for Evaluation and Treatment: Rehabilitation  SUBJECTIVE:  SUBJECTIVE STATEMENT: I've been doing the HEP exs you gave me last time and I've been working on walking my 5 mins at a time a few times a day. I'm still limited with this but working on consistency right now.   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 It measures 3.8 cm and is located in the  inner lower quadrant. 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 2 nodes taken at biopsy - 1 was negative the other was positive. Had blood transfusion on 10/05/22. Rt mastectomy and SLNB on 11/01/22 2/6 nodes positive. Currently in radiation and getting Keytruda.  Hx of DVT and stroke in 2015.    PAIN:  Are you having pain? No  PRECAUTIONS: Right lymphedema risk   RED FLAGS: None   WEIGHT BEARING RESTRICTIONS: No  FALLS:  Has patient fallen in last 6 months? No I do have a fear of falling.  I can feel like my legs with crossed up,  LIVING ENVIRONMENT: Lives with: lives with their family - husband and son Lives in: House/apartment Has following equipment at home: Single point cane, Counselling psychologist, Environmental consultant - 2 wheeled, shower chair, and bed side commode  OCCUPATION: on disability since stroke 2015  LEISURE: I get up and down the stairs, trying to walk outside   HAND DOMINANCE: right   PRIOR LEVEL OF FUNCTION: Independent  PATIENT GOALS: Improve my balance - when I get up I sway a little bit      OBJECTIVE: Note: Objective measures were completed at Evaluation unless otherwise noted.  COGNITION: Overall cognitive status: Within functional limits for tasks assessed   SENSATION: WNL with darcofoot testing bil WNL vibration testing  POSTURE: rounded shoulders   MMT: Seated:  knees and ankles 4+/5 Hip Flexion 4+/5 bil Hip abduction 4/5 bil  FUNCTIONAL TESTS:  Reports no trouble with endurance - omitted 6 min walk   BERG: 45/56 See flow sheet for details  37-45 = Significant (>80%) fall risk  Score 44 - 46.5 = patient should use cane indoor   DGI: 22: See flow sheet for detials:  Dynamic Gait Index Scores of 19 or less are predictive of falls in older community living adults    GAIT: Distance walked: in clinic Assistive device utilized: none Level of assistance: Complete  Independence Comments: appearance of Rt knee flexion during stance time, Slight wide BOS  L-DEX LYMPHEDEMA SCREENING: Currently getting SOZO - in the green                                                                                                                            TREATMENT DATE:  08/06/23: Therapeutic Exercises NuStep Level 4 UE 9/LE 5 x 7 mins, 546 steps Bil leg press 50# 2 x 10 with short rest between sets Gastroc Stretch in // bars x 2 reps, 20 sec  Seated  HS stretch x 2 reps, 20 sec  Neuro Re Ed In // bars: Front and retro tandem walking x 4 laps each with VC's throughout to remind pt to look straight ahead and for slower pace, she improved with more reps 6" step ups with 3" SLS on step with each rep x 10 each leg SLS on blue oval with 1# on each ankle for bil LE 3 way raises 2 x 10 each   07/31/23: Therapeutic Exercises NuStep Level 4 UE 9/LE 5 x 7 mins, 270 steps. Discussed importance of daily walking routine with pt and encouraged her to resume this, even if she needs to start in her hallway until she feels safe to walk outside again.  Bil gastroc stretch x 2, 20 sec each in lunge position, next seated HS stretch x 2, 20 sec holds Neuro Re Ed In // bars: Front and retro heel-toe walking x 2 laps, 6" step up x 10 each leg with 3 sec SLS with each rep; bil tandem stance practice x 30", bil heel raises x 15, then SLS heel raise: challenging on Rt so Lt toe down for stability, then Lt x 10 all done with fingertip support and returning therapist demo.  Back to // bars for bil hip 3 way raises into flex, abd and ext x 10 each returning therapist demo and VC's for correct UE position. Handout issued for all exs done today.  06/19/23 Eval performed Discussed evaluation findings per above including Assistive device recommendations of  SPC in the community.  Pt reports she knows she should do this and does have a cane.  Discussed radiation skin healing and allowing for this and  completion of appointments before starting PT for balance due to daily nature of these.  Discussed walking, posture, and gentle motion until return.     PATIENT EDUCATION:  Education details: Standing bil LE strength and balance Person educated: Patient Education method: Explanation, demonstration, and handouts issued Education comprehension: verbalized understanding, returned demonstration, VC's and will benefit from further review  HOME EXERCISE PROGRAM: Walking, posture 07/31/23 - Standing bil LE strength and balance activities  ASSESSMENT:  CLINICAL IMPRESSION: Continued with bil LE strength and balance activities. Pt was challenged by the addition weights today and reports will continuing to work on progressing her daily walking routine.    OBJECTIVE IMPAIRMENTS: Abnormal gait, decreased balance, and difficulty walking.   ACTIVITY LIMITATIONS: locomotion level  PARTICIPATION LIMITATIONS: none  PERSONAL FACTORS: Age, Fitness, Time since onset of injury/illness/exacerbation, and 1-2 comorbidities: chemo hx  are also affecting patient's functional outcome.   REHAB POTENTIAL: Excellent  CLINICAL DECISION MAKING: Stable/uncomplicated  EVALUATION COMPLEXITY: Low  GOALS: Goals reviewed with patient? Yes  SHORT TERM GOALS=LTGs: Target date: 09/11/23  Pt will  improve BERG balance score to 50/56 to decrease risk of falling  Baseline:45 Goal status: INITIAL  2.  Pt will improve single leg stance to at least 6" on bil legs to decrease risk of falling and improve gait  Baseline: 1-2" bil; 07/31/23 - 2-3" bil Goal status: ONGOING  3.  Pt will be ind with final HEP for continued strength and mobility  Baseline:  Goal status: INITIAL   PLAN:  PT FREQUENCY: 2x/week  PT DURATION: 6 weeks - after radiation ends  PLANNED INTERVENTIONS: 97164- PT Re-evaluation, 97110-Therapeutic exercises, 97530- Therapeutic activity, 97112- Neuromuscular re-education, 97535- Self Care, [id number]-  Manual therapy, [phone]- Gait training, Patient/Family education, Balance training, Joint mobilization, Therapeutic exercises, Therapeutic activity, Neuromuscular re-education, Gait training, and  Self Care  PLAN FOR NEXT SESSION: Cont balance work and general TE. Progress HEP as pt tolerates.   Hermenia Bers, PTA 08/06/2023, 10:59 AM

## 2023-08-12 ENCOUNTER — Inpatient Hospital Stay: Payer: No Typology Code available for payment source | Admitting: Hematology

## 2023-08-13 ENCOUNTER — Ambulatory Visit: Payer: No Typology Code available for payment source

## 2023-08-13 DIAGNOSIS — Z483 Aftercare following surgery for neoplasm: Secondary | ICD-10-CM | POA: Diagnosis not present

## 2023-08-13 DIAGNOSIS — R262 Difficulty in walking, not elsewhere classified: Secondary | ICD-10-CM

## 2023-08-13 NOTE — Therapy (Signed)
 OUTPATIENT PHYSICAL THERAPY   ONCOLOGY TREATMENT  Patient Name: Marilyn Ford MRN: [id number] DOB:August 08, 1960, 63 y.o., female Today's Date: 08/13/2023  END OF SESSION:  PT End of Session - 08/13/23 1011     Visit Number 4    Number of Visits 13    Date for PT Re-Evaluation 09/11/23    PT Start Time 1009    PT Stop Time 1053    PT Time Calculation (min) 44 min    Activity Tolerance Patient tolerated treatment well    Behavior During Therapy WFL for tasks assessed/performed              Past Medical History:  Diagnosis Date   Allergy    Anxiety    Breast cancer (HCC)    Diabetes mellitus without complication (HCC)    GERD (gastroesophageal reflux disease)    Heart murmur    as a child   History of kidney stones    Hypertension    Pneumonia    Stroke [facility])    Vaginal Pap smear, abnormal    Past Surgical History:  Procedure Laterality Date   BREAST BIOPSY  10/31/2022   [place] RT RADIOACTIVE SEED LOC 10/31/2022 [facility]   CESAREAN SECTION     CHOLECYSTECTOMY     COLONOSCOPY WITH PROPOFOL N/A 07/19/2020   Procedure: COLONOSCOPY WITH PROPOFOL;  Surgeon: Dolores Frame, MD;  [facility]: [facility];  Service: Gastroenterology;  Laterality: N/A;  AM   POLYPECTOMY  07/19/2020   Procedure: POLYPECTOMY;  Surgeon: Dolores Frame, MD;  Location: [facility];  Service: Gastroenterology;;   PORTACATH PLACEMENT N/A 03/20/2022   Procedure: INSERTION PORT-A-CATH;  Surgeon: Abigail Miyamoto, MD;  Location: [facility];  Service: General;  Laterality: N/A;   RADIOACTIVE SEED GUIDED AXILLARY SENTINEL LYMPH NODE Right 11/01/2022   Procedure: RADIOACTIVE SEED GUIDED RIGHT AXILLARY SENTINEL LYMPH NODE DISSECTION;  Surgeon: Abigail Miyamoto, MD;  Location: [facility];  Service: General;  Laterality: Right;   SIMPLE MASTECTOMY WITH AXILLARY SENTINEL NODE BIOPSY Right 11/01/2022   Procedure: RIGHT SIMPLE MASTECTOMY;  Surgeon: Abigail Miyamoto, MD;   Location: [facility];  Service: General;  Laterality: Right;   Patient Active Problem List   Diagnosis Date Noted   S/P mastectomy, right 11/01/2022   Genetic testing 06/05/2022   Neutropenia, drug-induced (HCC) 05/28/2022   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) 03/05/2022   Encounter for screening fecal occult blood testing 03/01/2022   Routine cervical smear 03/01/2022   Axillary adenopathy 02/20/2022   Mass of lower outer quadrant of right breast 01/30/2022   Cat bite of index finger 09/12/2021   Gastroesophageal reflux disease without esophagitis 10/20/2019   GAD (generalized anxiety disorder) 10/20/2019   Insomnia due to medical condition 10/20/2019   Hemiparesis affecting right side as late effect of cerebrovascular accident (CVA) (HCC) 04/22/2015   Diabetes mellitus type II, controlled (HCC) 12/23/2014   Hypertension 12/23/2014    PCP: Mechele Claude, MD  REFERRING PROVIDER: Lillard Anes, NP  REFERRING DIAG:  Diagnosis  C50.811,Z17.1 (ICD-10-CM) - Malignant neoplasm of overlapping sites of right breast in female, estrogen receptor negative (HCC)  C50.811 (ICD-10-CM) - Malignant neoplasm of overlapping sites of right female breast, unspecified estrogen receptor status (HCC)   THERAPY DIAG:  Aftercare following surgery for neoplasm  Difficulty in walking, not elsewhere classified  ONSET DATE: 02/20/22  Rationale for Evaluation and Treatment: Rehabilitation  SUBJECTIVE:  SUBJECTIVE STATEMENT: I walk a lot now. I've been doing the HEP too. I just get up and go outside and walk. My dog jumped up on the couch where I was laying trying to look out the window and he ran right into my face and now I have a black eye and he cut my eyebrow. My cancer  doc wants to see me to check on me from that so I'm going over there Thursday.   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 It measures 3.8 cm and is located in the inner lower quadrant. 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 2 nodes taken at biopsy - 1 was negative the other was positive. Had blood transfusion on 10/05/22. Rt mastectomy and SLNB on 11/01/22 2/6 nodes positive. Currently in radiation and getting Keytruda.  Hx of DVT and stroke in 2015.    PAIN:  Are you having pain? No  PRECAUTIONS: Right lymphedema risk   RED FLAGS: None   WEIGHT BEARING RESTRICTIONS: No  FALLS:  Has patient fallen in last 6 months? No I do have a fear of falling.  I can feel like my legs with crossed up,  LIVING ENVIRONMENT: Lives with: lives with their family - husband and son Lives in: House/apartment Has following equipment at home: Single point cane, Counselling psychologist, Environmental consultant - 2 wheeled, shower chair, and bed side commode  OCCUPATION: on disability since stroke 2015  LEISURE: I get up and down the stairs, trying to walk outside   HAND DOMINANCE: right   PRIOR LEVEL OF FUNCTION: Independent  PATIENT GOALS: Improve my balance - when I get up I sway a little bit      OBJECTIVE: Note: Objective measures were completed at Evaluation unless otherwise noted.  COGNITION: Overall cognitive status: Within functional limits for tasks assessed   SENSATION: WNL with darcofoot testing bil WNL vibration testing  POSTURE: rounded shoulders   MMT: Seated:  knees and ankles 4+/5 Hip Flexion 4+/5 bil Hip abduction 4/5 bil  FUNCTIONAL TESTS:  Reports no trouble with endurance - omitted 6 min walk   BERG: 45/56 See flow sheet for details  37-45 = Significant (>80%) fall risk  Score 44 - 46.5 = patient should use cane indoor   DGI: 22: See flow sheet for detials:  Dynamic  Gait Index Scores of 19 or less are predictive of falls in older community living adults    GAIT: Distance walked: in clinic Assistive device utilized: none Level of assistance: Complete Independence Comments: appearance of Rt knee flexion during stance time, Slight wide BOS  L-DEX LYMPHEDEMA SCREENING: Currently getting SOZO - in the green                                                                                                                            TREATMENT DATE:  08/13/23: Therapeutic  Exercises NuStep Level 4 UE 9/LE 5 x 9 mins, 523 steps Bil leg press 50# 2 x 10 with short rest between sets Gastroc Stretch in // bars x 2 reps, 20 sec  Seated HS stretch x 2 reps, 20 sec  Neuro Re Ed Free Motion machine: Rows with 7# with core engaged and for postural strength to promote improved balance In // bars: Front and retro tandem walking x 3 laps each with VC's initially to remind pt to look straight ahead and for slower pace but this was some improved today At side of TM for handrail: 6" step ups with 3" SLS on step with each rep x 10 each leg with +1 HHA SLS on blue oval with 1# on each ankle for bil LE 3 way raises x 10 each with demo and tactile cues during to remind pt of correct technique, increased LE fatigue at this point so stopped  08/06/23: Therapeutic Exercises NuStep Level 4 UE 9/LE 5 x 9 mins, 546 steps Bil leg press 50# 2 x 10 with short rest between sets Gastroc Stretch in // bars x 2 reps, 20 sec  Seated HS stretch x 2 reps, 20 sec  Neuro Re Ed In // bars: Front and retro tandem walking x 4 laps each with VC's throughout to remind pt to look straight ahead and for slower pace, she improved with more reps 6" step ups with 3" SLS on step with each rep x 10 each leg SLS on blue oval with 1# on each ankle for bil LE 3 way raises 2 x 10 each   07/31/23: Therapeutic Exercises NuStep Level 4 UE 9/LE 5 x 7 mins, 270 steps. Discussed importance of daily walking  routine with pt and encouraged her to resume this, even if she needs to start in her hallway until she feels safe to walk outside again.  Bil gastroc stretch x 2, 20 sec each in lunge position, next seated HS stretch x 2, 20 sec holds Neuro Re Ed In // bars: Front and retro heel-toe walking x 2 laps, 6" step up x 10 each leg with 3 sec SLS with each rep; bil tandem stance practice x 30", bil heel raises x 15, then SLS heel raise: challenging on Rt so Lt toe down for stability, then Lt x 10 all done with fingertip support and returning therapist demo.  Back to // bars for bil hip 3 way raises into flex, abd and ext x 10 each returning therapist demo and VC's for correct UE position. Handout issued for all exs done today.     PATIENT EDUCATION:  Education details: Standing bil LE strength and balance Person educated: Patient Education method: Explanation, demonstration, and handouts issued Education comprehension: verbalized understanding, returned demonstration, VC's and will benefit from further review  HOME EXERCISE PROGRAM: Walking, posture 07/31/23 - Standing bil LE strength and balance activities  ASSESSMENT:  CLINICAL IMPRESSION: Pt comes in reporting she has been feeling more energetic at home. She has been walking daily, a few times a day. She is also doing well with being compliant with HEP issued at last session. Today continued with bil LE strength and endurance. Also spent time working on dynamic balance. Did not progress pt except with increased time on NuStep as she reports she was very fatigued after last session. Also she has been more active with her new walking routine and HEP so will see how she tolerates todays session before progressing at next visit. Spent a few  mins at end of session reviewing importance of continuing daily walking routine and HEP while she is still undergoing chemo. Also not to overdo on days she feels good, and try to do a little on her weaker days. Pt  able to verbalize good understanding.    OBJECTIVE IMPAIRMENTS: Abnormal gait, decreased balance, and difficulty walking.   ACTIVITY LIMITATIONS: locomotion level  PARTICIPATION LIMITATIONS: none  PERSONAL FACTORS: Age, Fitness, Time since onset of injury/illness/exacerbation, and 1-2 comorbidities: chemo hx  are also affecting patient's functional outcome.   REHAB POTENTIAL: Excellent  CLINICAL DECISION MAKING: Stable/uncomplicated  EVALUATION COMPLEXITY: Low  GOALS: Goals reviewed with patient? Yes  SHORT TERM GOALS=LTGs: Target date: 09/11/23  Pt will  improve BERG balance score to 50/56 to decrease risk of falling  Baseline:45 Goal status: INITIAL  2.  Pt will improve single leg stance to at least 6" on bil legs to decrease risk of falling and improve gait  Baseline: 1-2" bil; 07/31/23 - 2-3" bil Goal status: ONGOING  3.  Pt will be ind with final HEP for continued strength and mobility  Baseline:  Goal status: INITIAL   PLAN:  PT FREQUENCY: 2x/week  PT DURATION: 6 weeks - after radiation ends  PLANNED INTERVENTIONS: 97164- PT Re-evaluation, 97110-Therapeutic exercises, 97530- Therapeutic activity, 97112- Neuromuscular re-education, 97535- Self Care, 82956- Manual therapy, [phone]- Gait training, Patient/Family education, Balance training, Joint mobilization, Therapeutic exercises, Therapeutic activity, Neuromuscular re-education, Gait training, and Self Care  PLAN FOR NEXT SESSION: Cont balance work and general TE. Progress HEP as pt tolerates.   Hermenia Bers, PTA 08/13/2023, 11:06 AM

## 2023-08-15 ENCOUNTER — Other Ambulatory Visit: Payer: Self-pay | Admitting: Family Medicine

## 2023-08-15 ENCOUNTER — Inpatient Hospital Stay: Attending: Hematology and Oncology | Admitting: Hematology

## 2023-08-15 VITALS — BP 140/68 | HR 71 | Temp 98.0°F | Resp 16 | Wt 164.2 lb

## 2023-08-15 DIAGNOSIS — Z87891 Personal history of nicotine dependence: Secondary | ICD-10-CM | POA: Insufficient documentation

## 2023-08-15 DIAGNOSIS — D508 Other iron deficiency anemias: Secondary | ICD-10-CM

## 2023-08-15 DIAGNOSIS — Z1722 Progesterone receptor negative status: Secondary | ICD-10-CM | POA: Diagnosis not present

## 2023-08-15 DIAGNOSIS — Z923 Personal history of irradiation: Secondary | ICD-10-CM | POA: Diagnosis not present

## 2023-08-15 DIAGNOSIS — Z9221 Personal history of antineoplastic chemotherapy: Secondary | ICD-10-CM | POA: Diagnosis not present

## 2023-08-15 DIAGNOSIS — C50811 Malignant neoplasm of overlapping sites of right female breast: Secondary | ICD-10-CM | POA: Insufficient documentation

## 2023-08-15 DIAGNOSIS — D72819 Decreased white blood cell count, unspecified: Secondary | ICD-10-CM | POA: Diagnosis not present

## 2023-08-15 DIAGNOSIS — D539 Nutritional anemia, unspecified: Secondary | ICD-10-CM | POA: Insufficient documentation

## 2023-08-15 DIAGNOSIS — Z171 Estrogen receptor negative status [ER-]: Secondary | ICD-10-CM | POA: Insufficient documentation

## 2023-08-15 DIAGNOSIS — D696 Thrombocytopenia, unspecified: Secondary | ICD-10-CM | POA: Insufficient documentation

## 2023-08-15 DIAGNOSIS — Z1732 Human epidermal growth factor receptor 2 negative status: Secondary | ICD-10-CM | POA: Diagnosis not present

## 2023-08-15 DIAGNOSIS — Z79899 Other long term (current) drug therapy: Secondary | ICD-10-CM | POA: Diagnosis not present

## 2023-08-15 NOTE — Patient Instructions (Addendum)
 [facility] at [facility] Discharge Instructions   You were seen and examined today by Dr. Ellin Saba.  He reviewed the results of your lab work which are normal/stable.   We will see back in 3 months.   Return as scheduled.    Thank you for choosing [facility] at [facility] to provide your oncology and hematology care.  To afford each patient quality time with our provider, please arrive at least 15 minutes before your scheduled appointment time.   If you have a lab appointment with the Cancer Center please come in thru the Main Entrance and check in at the main information desk.  You need to re-schedule your appointment should you arrive 10 or more minutes late.  We strive to give you quality time with our providers, and arriving late affects you and other patients whose appointments are after yours.  Also, if you no show three or more times for appointments you may be dismissed from the clinic at the providers discretion.     Again, thank you for choosing [facility].  Our hope is that these requests will decrease the amount of time that you wait before being seen by our physicians.       _____________________________________________________________  Should you have questions after your visit to [facility], please contact our office at [phone] and follow the prompts.  Our office hours are 8:00 a.m. and 4:30 p.m. Monday - Friday.  Please note that voicemails left after 4:00 p.m. may not be returned until the following business day.  We are closed weekends and major holidays.  You do have access to a nurse 24-7, just call the main number to the clinic [phone] and do not press any options, hold on the line and a nurse will answer the phone.    For prescription refill requests, have your pharmacy contact our office and allow 72 hours.    Due to Covid, you will need to wear a mask upon entering the hospital.  If you do not have a mask, a mask will be given to you at the Main Entrance upon arrival. For doctor visits, patients may have 1 support person age 65 or older with them. For treatment visits, patients can not have anyone with them due to social distancing guidelines and our immunocompromised population.

## 2023-08-15 NOTE — Progress Notes (Signed)
 [facility] [address], [place] [address]    Clinic Day:  08/15/2023  Referring physician: [name], MD  Patient Care Team: Mechele Claude, MD as PCP - General (Family Medicine) Mechele Claude, MD (Family Medicine) Danella Maiers, [facility] as Pharmacist (Family Medicine) Marguerita Merles, Reuel Boom, MD as Consulting Physician (Gastroenterology) Franky Macho, MD as Consulting Physician (General Surgery) Pershing Proud, RN as Oncology Nurse Navigator [name], RN as Oncology Nurse Navigator [name], MD as Medical Oncologist (Medical Oncology) Therese Sarah, RN as Oncology Nurse Navigator (Medical Oncology) [name], MD as Consulting Physician (Hematology) Mechele Claude, MD as Referring Physician (Family Medicine)   ASSESSMENT & PLAN:   Assessment: 1.  Inflammatory right breast cancer: - Bilateral diagnostic mammogram (02/20/2022): Suspicious right axillary lymphadenopathy.  Indeterminate intramammary lymph node in the right breast at 9:00.  New right breast skin and trabecular thickening, related to vascular congestion from enlarged lymph nodes in the right axilla. - Right axillary lymph node core biopsy (02/20/2022): Morphology and IHC compatible with primary breast cancer with differential diagnosis of urothelial carcinoma and primary lung carcinoma (squamous).  Grade 3.  ER 0%, PR 0%, HER2 2+, Ki-67 60%, HER2 negative by FISH. - Right breast lymph node biopsy at 9:00 (02/20/2022): Negative for carcinoma. - MRI breast (03/02/2022): In the upper outer right breast, mid to posterior depth there is a patchy clumped non-mass enhancement in a linear orientation spanning approximately 3.8 cm.  There is diffuse thickening of the skin in the right breast with skin enhancement.  Left breast with no mass or abnormal enhancement.  Numerous bulky matted lymph nodes in the right axilla. - Right breast UOQ biopsy (03/19/2022):  Benign breast with fibrocystic changes including stromal fibrosis, adenosis, usual ductal hyperplasia.  Negative for carcinoma. - PET scan (03/22/2022): Bulky hypermetabolic right axillary and subpectoral lymphadenopathy.  No other definite new sites of metastatic disease.  6 mm right lower lobe lung nodule with no FDG uptake. - Neoadjuvant chemotherapy with weekly paclitaxel and pembrolizumab from 04/02/2022 through 06/21/2022,  AC and pembrolizumab from 07/12/2022 through 09/27/2022 - Right mastectomy and lymph node excision (11/01/2022): YpT0, YPN1A, 2/3 lymph nodes positive for metastatic disease, no ECE.  Surgical margins negative. - Germline mutation testing: Negative - Adjuvant pembrolizumab and Xeloda (CREATE-X) started on 12/05/2022, Xeloda dose reduced to 2 tablets twice daily with cycle 2 due to mucositis and feet hurting with skin peeling.  Completed Xeloda around 05/23/2023.  Last dose of Keytruda on 06/03/2023. - XRT to the right chest wall from 06/03/2023 - 07/19/2023 under the direction of Dr. Mitzi Hansen.    Plan: 1.  Triple negative right breast cancer: - She has completed right chest wall radiation under the direction of Dr. Mitzi Hansen from 06/03/2023 through 07/19/2023. - She denies any new onset pains. - Physical exam: Right mastectomy site is within normal limits.  No palpable adenopathy. - She will have a left breast mammogram on Sep 10, 2022. - Reviewed labs from 08/05/2023: Normal LFTs.  CBC shows leukopenia and thrombocytopenia with mild macrocytic anemia.  CA 15-3 is minimally elevated at 33.4 which will be her baseline. - Recommend follow-up in 3 months with repeat labs including tumor markers.   2.  Hypomagnesemia: - She is taking magnesium twice daily.  Magnesium is 1.8.  She may cut back to once daily.   3.  Mild leukopenia and thrombocytopenia: - Mild leukopenia and thrombocytopenia with macrocytosis.  ANC is normal.  Will check B12  and folic acid prior to next visit.    Orders Placed  This Encounter  Procedures   CBC with Differential    Standing Status:   Future    Expected Date:   11/11/2023    Expiration Date:   08/14/2024   Comprehensive metabolic panel    Standing Status:   Future    Expected Date:   11/11/2023    Expiration Date:   08/14/2024   Magnesium    Standing Status:   Future    Expected Date:   11/11/2023    Expiration Date:   08/14/2024   Cancer antigen 15-3    Standing Status:   Future    Expected Date:   11/11/2023    Expiration Date:   08/14/2024   Cancer antigen 27.29    Standing Status:   Future    Expected Date:   11/11/2023    Expiration Date:   08/14/2024   Iron and TIBC ([facility] ONLY)    Standing Status:   Future    Expected Date:   11/11/2023    Expiration Date:   08/14/2024   Ferritin    Standing Status:   Future    Expected Date:   11/11/2023    Expiration Date:   08/14/2024   Vitamin B12    Standing Status:   Future    Expected Date:   11/11/2023    Expiration Date:   08/14/2024   Folate    Standing Status:   Future    Expected Date:   11/11/2023    Expiration Date:   08/14/2024      Mikeal Hawthorne R Teague,acting as a scribe for [name], MD.,have documented all relevant documentation on the behalf of [name], MD,as directed by  [name], MD while in the presence of [name], MD.  I, [name] MD, have reviewed the above documentation for accuracy and completeness, and I agree with the above.    [name], MD   4/10/20254:12 PM  CHIEF COMPLAINT:   Diagnosis: locally advanced TNBC    Cancer Staging  Malignant neoplasm of overlapping sites of right female breast [facility] Staging form: Breast, AJCC 8th Edition - Clinical stage from 03/08/2022: Stage IIIC (cT4d, cN2, cM0, G3, ER-, PR-, HER2-) - Signed by Malachy Mood, MD on 03/08/2022    Prior Therapy: 1. carboplatin and paclitaxel, 4 cycles, 04/02/22 - 06/06/22 2. Pembrolizumab, 04/02/22 - 09/27/22 3. Adriamycin and  Cytoxan, 4 cycles, 07/12/22 - 09/27/22 4. Right mastectomy, 11/01/22  Current Therapy:  Capecitabine and pembrolizumab    HISTORY OF PRESENT ILLNESS:   Oncology History Overview Note   Cancer Staging  Malignant neoplasm of overlapping sites of right female breast [facility]) Staging form: Breast, AJCC 8th Edition - Clinical stage from 03/08/2022: Stage IIIC (cT2, cN2, cM0, G3, ER-, PR-, HER2-) - Signed by Malachy Mood, MD on 03/08/2022    Malignant neoplasm of overlapping sites of right female breast [facility])  02/20/2022 Mammogram   CLINICAL DATA:  63 year old female recalled from screening mammography 03/01/2021 for right breast calcifications and subsequent benign discordant biopsy of these calcifications in the central posterior right breast December 2022 with excision recommended. The patient initially followed up with surgery in April however canceled her scheduled surgery and most recently followed up with Dr. Henreitta Leber September 2023 with diagnostic imaging, possible RF tag placement and subsequent excision recommended.   EXAM: DIGITAL DIAGNOSTIC BILATERAL MAMMOGRAM WITH TOMOSYNTHESIS; ULTRASOUND RIGHT BREAST LIMITED  MPRESSION: 1.  Suspicious right axillary lymphadenopathy.  2. Indeterminate intramammary lymph node in the right breast at 9 o'clock.   3. 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   4. Decreased calcifications noted at prior benign biopsy site in the lower central posterior right breast at site of X shaped biopsy marking clip.   02/20/2022 Initial Biopsy   FINAL MICROSCOPIC DIAGNOSIS:   A. AXILLA, RIGHT, LYMPH NODE, NEEDLE CORE BIOPSY:  - Positive for carcinoma (see Comment)   B. LYMPH NODE, RIGHT BREAST, BIOPSY:  - Negative for carcinoma   COMMENT:  Part A: Morphology and immunohistochemical staining are most  compatible with primary breast carcinoma with metaplastic changes, however differential diagnosis also includes urothelial carcinoma and less likely primary lung carcinoma (squamous).  No lymphoid tissue is identified.  Clinical and radiologic correlation is suggested.   ADDENDUM:  In case of a breast origin, the appropriate grade would be grade 3  (3+3+2)   ADDENDUM:  PROGNOSTIC INDICATOR RESULTS:  The tumor cells are EQUIVOCAL for Her2 (2+).  Estrogen Receptor:       0%, NEGATIVE  Progesterone Receptor:   0%, NEGATIVE  Proliferation Marker Ki-67:   60%   ADDENDUM:  FLOURESCENCE IN-SITU HYBRIDIZATION RESULTS:  GROUP 5:   HER2 **NEGATIVE**    03/02/2022 Imaging   EXAM: BILATERAL BREAST MRI WITH AND WITHOUT CONTRAST  IMPRESSION: 1. There is a suspicious 3.8 cm area of patchy non mass enhancement in a linear orientation in the slightly upper outer right breast in the mid to posterior depth spanning 3.8 cm.   2. Diffuse skin thickening with enhancement of the skin, concerning for inflammatory breast cancer.   3. Numerous bulky matted lymph nodes in the right axilla, one of which corresponds with the biopsy-proven metastatic lymph node.   4.  No evidence of left breast malignancy.   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)   03/08/2022 Cancer Staging   Staging form: Breast, AJCC 8th Edition - Clinical stage from 03/08/2022: Stage IIIC (cT4d, cN2, cM0, G3, ER-, PR-, HER2-) - Signed by Malachy Mood, MD on 03/08/2022 Histologic grading system: 3 grade system   03/19/2022 Pathology Results   Diagnosis Breast, right, needle core biopsy, upper outer quadrant, barbell clip BENIGN BREAST WITH FIBROCYSTIC CHANGES INCLUDING STROMAL FIBROSIS, ADENOSIS AND USUAL DUCT HYPERPLASIA BENIGN FIBROMATOID CHANGE NEGATIVE FOR MICROCALCIFICATIONS NEGATIVE FOR CARCINOMA   03/22/2022 PET scan   IMPRESSION: Bulky hypermetabolic right axillary  and subpectoral lymphadenopathy, consistent with metastatic disease.   No other definite sites of metastatic disease identified.   6 mm right lower lobe pulmonary nodule shows no FDG uptake, but is too small to definitively characterize by PET. Recommend continued follow-up by chest CT in 3-4 months.   Aortic Atherosclerosis (ICD10-I70.0).   04/02/2022 - 09/28/2022 Chemotherapy   Patient is on Treatment Plan : BREAST Pembrolizumab (200) D1 + Carboplatin (5) D1 + Paclitaxel (80) D1,8,15 q21d X 4 cycles / Pembrolizumab (200) D1 + AC D1 q21d x 4 cycles      Genetic Testing   Negative genetic testing. No pathogenic variants identified on the [facility] Common Hereditary Cancers+RNA panel. The report date is 05/31/2022.  The Common Hereditary Cancers Panel + RNA offered by [facility] includes sequencing and/or deletion duplication testing of the following 48 genes: APC*, ATM*, AXIN2, BAP1, BARD1, BMPR1A, BRCA1, BRCA2, BRIP1, CDH1, CDK4, CDKN2A (p14ARF), CDKN2A (p16INK4a), CHEK2,  CTNNA1, DICER1*, EPCAM*, FH*, GREM1*, HOXB13, KIT, MBD4, MEN1*, MLH1*, MSH2*, MSH3*, MSH6*, MUTYH, NF1*, NTHL1, PALB2, PDGFRA, PMS2*, POLD1*, POLE, PTEN*, RAD51C, RAD51D, SDHA*, SDHB, SDHC*, SDHD, SMAD4, SMARCA4, STK11, TP53, TSC1*, TSC2, VHL.    12/12/2022 -  Chemotherapy   Patient is on Treatment Plan : BREAST Pembrolizumab (200) q21d x 27 weeks        INTERVAL HISTORY:   Marilyn Ford is a 63 y.o. female presenting to clinic today for follow up of locally advanced TNBC. She was last seen by me on 05/13/23.  Since her last visit, she finished XRT on 07/19/23. Jaileigh has a right mammogram scheduled for 09/10/2023.   Today, she states that she is doing well overall. Her appetite level is at 80%. Her energy level is at 80%. She is accompanied by her husband. Lizza states she tolerated radiation therapy well and denies any side effects. She is taking Magnesium BID.  Rosamary states she has diarrhea after eating, particularly when eating out. She  has been taking magnesium and zinc supplements at the same time.   PAST MEDICAL HISTORY:   Past Medical History: Past Medical History:  Diagnosis Date   Allergy    Anxiety    Breast cancer (HCC)    Diabetes mellitus without complication (HCC)    GERD (gastroesophageal reflux disease)    Heart murmur    as a child   History of kidney stones    Hypertension    Pneumonia    Stroke [facility])    Vaginal Pap smear, abnormal     Surgical History: Past Surgical History:  Procedure Laterality Date   BREAST BIOPSY  10/31/2022   [place] RT RADIOACTIVE SEED LOC 10/31/2022 [facility]AMMO[facility]   CESAREAN SECTION     CHOLECYSTECTOMY     COLONOSCOPY WITH PROPOFOL N/A 07/19/2020   Procedure: COLONOSCOPY WITH PROPOFOL;  Surgeon: Dolores Frame, MD;  Location: [facility];  Service: Gastroenterology;  Laterality: N/A;  AM   POLYPECTOMY  07/19/2020   Procedure: POLYPECTOMY;  Surgeon: Marguerita Merles, Reuel Boom, MD;  Location: [facility];  Service: Gastroenterology;;   PORTACATH PLACEMENT N/A 03/20/2022   Procedure: INSERTION PORT-A-CATH;  Surgeon: Abigail Miyamoto, MD;  Location: [facility];  Service: General;  Laterality: N/A;   RADIOACTIVE SEED GUIDED AXILLARY SENTINEL LYMPH NODE Right 11/01/2022   Procedure: RADIOACTIVE SEED GUIDED RIGHT AXILLARY SENTINEL LYMPH NODE DISSECTION;  Surgeon: Abigail Miyamoto, MD;  Location: [facility];  Service: General;  Laterality: Right;   SIMPLE MASTECTOMY WITH AXILLARY SENTINEL NODE BIOPSY Right 11/01/2022   Procedure: RIGHT SIMPLE MASTECTOMY;  Surgeon: Abigail Miyamoto, MD;  Location: [facility];  Service: General;  Laterality: Right;    Social History: Social History   Socioeconomic History   Marital status: Married    Spouse name: Fayrene Fearing   Number of children: 2   Years of education: 10   Highest education level: 10th grade  Occupational History   Occupation: disabled  Tobacco Use   Smoking status: Former    Current  packs/day: 0.00    Average packs/day: 0.5 packs/day for 25.0 years (12.5 ttl pk-yrs)    Types: Cigarettes    Start date: 12/08/1999    Quit date: 03/12/2015    Years since quitting: 8.4   Smokeless tobacco: Never  Vaping Use   Vaping status: Never Used  Substance and Sexual Activity   Alcohol use: Yes    Alcohol/week: 2.0 - 3.0 standard drinks of alcohol    Types: 2 - 3  Glasses of wine per week   Drug use: No   Sexual activity: Not Currently    Birth control/protection: Abstinence, Post-menopausal  Other Topics Concern   Not on file  Social History Narrative   Volunteers at [employer] for a few hours every day.    She really enjoys getting out of of the house and working there.    Social Drivers of Corporate investment banker Strain: Low Risk  (06/17/2023)   Overall Financial Resource Strain (CARDIA)    Difficulty of Paying Living Expenses: Not very hard  Food Insecurity: No Food Insecurity (06/17/2023)   Hunger Vital Sign    Worried About Running Out of Food in the Last Year: Never true    Ran Out of Food in the Last Year: Never true  Transportation Needs: No Transportation Needs (06/17/2023)   PRAPARE - Administrator, Civil Service (Medical): No    Lack of Transportation (Non-Medical): No  Physical Activity: Insufficiently Active (06/17/2023)   Exercise Vital Sign    Days of Exercise per Week: 2 days    Minutes of Exercise per Session: 20 min  Stress: Stress Concern Present (06/17/2023)   [employer] of Occupational Health - Occupational Stress Questionnaire    Feeling of Stress : To some extent  Social Connections: Moderately Isolated (06/17/2023)   Social Connection and Isolation Panel [NHANES]    Frequency of Communication with Friends and Family: More than three times a week    Frequency of Social Gatherings with Friends and Family: Twice a week    Attends Religious Services: Never    Database administrator or Organizations: No    Attends Occupational hygienist Meetings: Never    Marital Status: Married  Catering manager Violence: Not At Risk (12/13/2022)   Humiliation, Afraid, Rape, and Kick questionnaire    Fear of Current or Ex-Partner: No    Emotionally Abused: No    Physically Abused: No    Sexually Abused: No    Family History: Family History  Problem Relation Age of Onset   Diabetes Mother    Uterine cancer Mother 70 - 61   Diabetes Brother    Breast cancer Maternal Aunt        dx >50, d. from cancer   Cancer Maternal Grandmother        unk type, "back cancer?"    Current Medications:  Current Outpatient Medications:    Alcohol Swabs (B-D SINGLE USE SWABS REGULAR) PADS, Test BS daily and as needed Dx E11.9, Disp: 100 each, Rfl: 3   aluminum-magnesium hydroxide-simethicone (MAALOX) 200-200-20 MG/5ML SUSP, Take 30 mLs by mouth 4 (four) times daily -  before meals and at bedtime., Disp: 480 mL, Rfl: 2   amLODipine (NORVASC) 10 MG tablet, 1 TABLET DAILY, Disp: 90 tablet, Rfl: 0   Apple Cider Vinegar 500 MG TABS, Take 500 mg by mouth in the morning., Disp: , Rfl:    aspirin 81 MG EC tablet, Take 1 tablet (81 mg total) by mouth daily., Disp: 30 tablet, Rfl: 0   atorvastatin (LIPITOR) 80 MG tablet, TAKE 1 TABLET EVERY DAY AT 6PM, Disp: 90 tablet, Rfl: 0   betamethasone dipropionate 0.05 % cream, Apply topically 2 (two) times daily., Disp: 30 g, Rfl: 0   Blood Glucose Calibration (TRUE METRIX LEVEL 1) Low SOLN, Use with glucometer Dx E11.9, Disp: 3 each, Rfl: 0   Blood Glucose Monitoring Suppl (TRUE METRIX AIR GLUCOSE METER) w/Device KIT, Test BS  daily and as needed Dx E11.9, Disp: 1 kit, Rfl: 0   Calcium Carb-Cholecalciferol (CALCIUM-VITAMIN D) 600-400 MG-UNIT TABS, Take 1 tablet by mouth in the morning., Disp: , Rfl:    Coenzyme Q10 (COQ10) 100 MG CAPS, Take 100 mg by mouth in the morning., Disp: , Rfl:    Cranberry 425 MG CAPS, Take 425 mg by mouth in the morning and at bedtime., Disp: , Rfl:    diphenoxylate-atropine  (LOMOTIL) 2.5-0.025 MG tablet, Take 1 tablet by mouth 4 (four) times daily as needed for diarrhea or loose stools., Disp: 30 tablet, Rfl: 0   escitalopram (LEXAPRO) 10 MG tablet, TAKE 1 TABLET BY MOUTH EVERY DAY, Disp: 90 tablet, Rfl: 0   Flaxseed, Linseed, (FLAXSEED OIL) 1000 MG CAPS, Take 1,000 mg by mouth in the morning., Disp: , Rfl:    glucose blood (TRUE METRIX BLOOD GLUCOSE TEST) test strip, Test BS daily and as needed Dx E11.9, Disp: 100 each, Rfl: 3   icosapent Ethyl (VASCEPA) 1 g capsule, Take 2 g by mouth 2 (two) times daily., Disp: , Rfl:    Krill Oil 500 MG CAPS, Take 3 capsules (1,500 mg total) by mouth in the morning and at bedtime., Disp: , Rfl:    lidocaine (XYLOCAINE) 2 % solution, Use as directed 15 mLs in the mouth or throat every 6 (six) hours as needed for mouth pain., Disp: 250 mL, Rfl: 1   lidocaine-prilocaine (EMLA) cream, Apply 1 Application topically as needed., Disp: 30 g, Rfl: 0   magnesium oxide (MAG-OX) 400 (240 Mg) MG tablet, TAKE 1 TABLET BY MOUTH TWICE A DAY, Disp: 60 tablet, Rfl: 1   meloxicam (MOBIC) 15 MG tablet, TAKE 1 TABLET (15 MG TOTAL) BY MOUTH DAILY FOR JOINT AND MUSCLE PAIN, Disp: 90 tablet, Rfl: 0   metoprolol (TOPROL-XL) 200 MG 24 hr tablet, TAKE 1 TABLET ONE TIME DAILY, WITH OR IMMEDIATELY FOLLOWING A MEAL, Disp: 90 tablet, Rfl: 0   Multiple Vitamin (MULTIVITAMIN) capsule, Take 1 capsule by mouth in the morning., Disp: , Rfl:    ondansetron (ZOFRAN) 8 MG tablet, Take 1 tablet (8 mg total) by mouth every 8 (eight) hours as needed for nausea or vomiting., Disp: 30 tablet, Rfl: 3   pantoprazole (PROTONIX) 40 MG tablet, TAKE 1 TABLET TWICE DAILY FOR STOMACH, Disp: 180 tablet, Rfl: 0   traZODone (DESYREL) 150 MG tablet, TAKE 1 OR 2 TABLETS AT BEDTIME FOR SLEEP, Disp: 180 tablet, Rfl: 0   TRUEplus Lancets 33G MISC, Test BS daily and as needed Dx E11.9, Disp: 100 each, Rfl: 3   valsartan (DIOVAN) 320 MG tablet, Take 1 tablet (320 mg total) by mouth daily. For  blood pressure., Disp: 90 tablet, Rfl: 3   zinc gluconate 50 MG tablet, Take 50 mg by mouth daily., Disp: , Rfl:    Zinc Oxide 20 % PSTE, Apply 1 Application topically 3 (three) times daily., Disp: 57 g, Rfl: 1   Allergies: Allergies  Allergen Reactions   Penicillins Shortness Of Breath   Sulfa Antibiotics Rash    REVIEW OF SYSTEMS:   Review of Systems  Constitutional:  Negative for chills, fatigue and fever.  HENT:   Positive for mouth sores. Negative for lump/mass, nosebleeds, sore throat and trouble swallowing.   Eyes:  Negative for eye problems.  Respiratory:  Negative for cough and shortness of breath.   Cardiovascular:  Negative for chest pain, leg swelling and palpitations.  Gastrointestinal:  Positive for diarrhea and nausea. Negative for abdominal pain,  constipation and vomiting.  Genitourinary:  Negative for bladder incontinence, difficulty urinating, dysuria, frequency, hematuria and nocturia.   Musculoskeletal:  Negative for arthralgias, back pain, flank pain, myalgias and neck pain.       +foot cramping  Skin:  Negative for itching and rash.  Neurological:  Positive for headaches. Negative for dizziness and numbness.  Hematological:  Does not bruise/bleed easily.  Psychiatric/Behavioral:  Positive for sleep disturbance. Negative for depression and suicidal ideas. The patient is not nervous/anxious.   All other systems reviewed and are negative.    VITALS:   Blood pressure (!) 140/68, pulse 71, temperature 98 F (36.7 C), temperature source Oral, resp. rate 16, weight 164 lb 3.9 oz (74.5 kg), SpO2 98%.  Wt Readings from Last 3 Encounters:  08/15/23 164 lb 3.9 oz (74.5 kg)  06/20/23 151 lb (68.5 kg)  06/10/23 150 lb 6 oz (68.2 kg)    Body mass index is 30.04 kg/m.  Performance status (ECOG): 1 - Symptomatic but completely ambulatory  PHYSICAL EXAM:   Physical Exam Vitals and nursing note reviewed. Exam conducted with a chaperone present.  Constitutional:       Appearance: Normal appearance.  Cardiovascular:     Rate and Rhythm: Normal rate and regular rhythm.     Pulses: Normal pulses.     Heart sounds: Normal heart sounds.  Pulmonary:     Effort: Pulmonary effort is normal.     Breath sounds: Normal breath sounds.  Chest:     Comments: +no palpable adenopathy +right mastectomy site WNL  Abdominal:     Palpations: Abdomen is soft. There is no hepatomegaly, splenomegaly or mass.     Tenderness: There is no abdominal tenderness.  Musculoskeletal:     Right lower leg: No edema.     Left lower leg: No edema.  Lymphadenopathy:     Cervical: No cervical adenopathy.     Right cervical: No superficial, deep or posterior cervical adenopathy.    Left cervical: No superficial, deep or posterior cervical adenopathy.     Upper Body:     Right upper body: No supraclavicular or axillary adenopathy.     Left upper body: No supraclavicular or axillary adenopathy.  Neurological:     General: No focal deficit present.     Mental Status: She is alert and oriented to person, place, and time.  Psychiatric:        Mood and Affect: Mood normal.        Behavior: Behavior normal.   Breast Exam Chaperone: Chapman Moss, RN   LABS:   CBC     Component Value Date/Time   WBC 3.2 (L) 08/05/2023 1450   RBC 3.01 (L) 08/05/2023 1450   HGB 10.8 (L) 08/05/2023 1450   HGB 8.8 (LL) 03/20/2023 1013   HCT 31.0 (L) 08/05/2023 1450   HCT 26.0 (L) 03/20/2023 1013   PLT 100 (L) 08/05/2023 1450   PLT 163 03/20/2023 1013   MCV 103.0 (H) 08/05/2023 1450   MCV 116 (H) 03/20/2023 1013   MCH 35.9 (H) 08/05/2023 1450   MCHC 34.8 08/05/2023 1450   RDW 13.1 08/05/2023 1450   RDW 15.7 (H) 03/20/2023 1013   LYMPHSABS 0.5 (L) 08/05/2023 1450   LYMPHSABS 0.6 (L) 03/20/2023 1013   MONOABS 0.4 08/05/2023 1450   EOSABS 0.1 08/05/2023 1450   EOSABS 0.1 03/20/2023 1013   BASOSABS 0.0 08/05/2023 1450   BASOSABS 0.0 03/20/2023 1013    CMP      Component Value  Date/Time   NA 136 08/05/2023 1450   NA 143 03/20/2023 1013   K 4.0 08/05/2023 1450   CL 101 08/05/2023 1450   CO2 25 08/05/2023 1450   GLUCOSE 175 (H) 08/05/2023 1450   BUN 18 08/05/2023 1450   BUN 20 03/20/2023 1013   CREATININE 0.99 08/05/2023 1450   CREATININE 0.97 04/02/2022 0924   CALCIUM 9.2 08/05/2023 1450   PROT 7.1 08/05/2023 1450   PROT 6.8 03/20/2023 1013   ALBUMIN 3.9 08/05/2023 1450   ALBUMIN 4.4 03/20/2023 1013   AST 39 08/05/2023 1450   AST 23 04/02/2022 0924   ALT 41 08/05/2023 1450   ALT 26 04/02/2022 0924   ALKPHOS 126 08/05/2023 1450   BILITOT 1.1 08/05/2023 1450   BILITOT 1.0 03/20/2023 1013   BILITOT 0.6 04/02/2022 0924   GFRNONAA >60 08/05/2023 1450   GFRNONAA >60 04/02/2022 0924   GFRAA 66 05/02/2020 1024     No results found for: "CEA1", "CEA" / No results found for: "CEA1", "CEA" No results found for: "PSA1" No results found for: "ZOX096" No results found for: "CAN125"  No results found for: "TOTALPROTELP", "ALBUMINELP", "A1GS", "A2GS", "BETS", "BETA2SER", "GAMS", "MSPIKE", "SPEI" Lab Results  Component Value Date   TIBC 350 02/14/2023   FERRITIN 599 (H) 02/14/2023   IRONPCTSAT 39 (H) 02/14/2023   No results found for: "LDH"   STUDIES:   No results found.

## 2023-08-16 ENCOUNTER — Other Ambulatory Visit: Payer: Self-pay

## 2023-08-16 NOTE — Progress Notes (Addendum)
  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 (as documented in provider EOT note)  The patient was available for call today.   Symptoms of fatigue have improved since completing therapy.  Symptoms of skin changes have improved since completing therapy.  Patient reports persistent diarrhea. Imodium is not helping. Patient recently started taking her Lomotil today and anticipates this will help.   The patient was encouraged to avoid sun exposure in the area of prior treatment for up to one year following radiation with either sunscreen or by the style of clothing worn in the sun.  The patient has scheduled follow up with her medical oncologist Dr. Ellin Saba for ongoing surveillance, and was encouraged to call if she develops concerns or questions regarding radiation.  This concludes the interaction.  Ruel Favors, LPN

## 2023-08-19 ENCOUNTER — Inpatient Hospital Stay
Admission: RE | Admit: 2023-08-19 | Discharge: 2023-08-19 | Disposition: A | Discharge status: Home / Self Care | Source: Ambulatory Visit

## 2023-08-20 ENCOUNTER — Ambulatory Visit: Payer: No Typology Code available for payment source

## 2023-08-20 DIAGNOSIS — Z483 Aftercare following surgery for neoplasm: Secondary | ICD-10-CM | POA: Diagnosis not present

## 2023-08-20 DIAGNOSIS — R262 Difficulty in walking, not elsewhere classified: Secondary | ICD-10-CM

## 2023-08-20 NOTE — Therapy (Signed)
 OUTPATIENT PHYSICAL THERAPY   ONCOLOGY TREATMENT  Patient Name: Marilyn Ford MRN: [id number] DOB:[birth year], 63 y.o., female Today's Date: 08/20/2023  END OF SESSION:  PT End of Session - 08/20/23 1008     Visit Number 5    Number of Visits 13    Date for PT Re-Evaluation 09/11/23    PT Start Time 1005    PT Stop Time 1055    PT Time Calculation (min) 50 min    Activity Tolerance Patient tolerated treatment well    Behavior During Therapy WFL for tasks assessed/performed              Past Medical History:  Diagnosis Date   Allergy    Anxiety    Breast cancer (HCC)    Diabetes mellitus without complication (HCC)    GERD (gastroesophageal reflux disease)    Heart murmur    as a child   History of kidney stones    Hypertension    Pneumonia    Stroke [facility])    Vaginal Pap smear, abnormal    Past Surgical History:  Procedure Laterality Date   BREAST BIOPSY  10/31/2022   [place] RT RADIOACTIVE SEED LOC 10/31/2022 [facility]   CESAREAN SECTION     CHOLECYSTECTOMY     COLONOSCOPY WITH PROPOFOL N/A 07/19/2020   Procedure: COLONOSCOPY WITH PROPOFOL;  Surgeon: Dolores Frame, MD;  [facility];  Service: Gastroenterology;  Laterality: N/A;  AM   POLYPECTOMY  07/19/2020   Procedure: POLYPECTOMY;  Surgeon: Dolores Frame, MD;  [facility];  Service: Gastroenterology;;   PORTACATH PLACEMENT N/A 03/20/2022   Procedure: INSERTION PORT-A-CATH;  Surgeon: Abigail Miyamoto, MD;  [facility]: [facility];  Service: General;  Laterality: N/A;   RADIOACTIVE SEED GUIDED AXILLARY SENTINEL LYMPH NODE Right 11/01/2022   Procedure: RADIOACTIVE SEED GUIDED RIGHT AXILLARY SENTINEL LYMPH NODE DISSECTION;  Surgeon: Abigail Miyamoto, MD;  Location: [facility];  Service: General;  Laterality: Right;   SIMPLE MASTECTOMY WITH AXILLARY SENTINEL NODE BIOPSY Right 11/01/2022   Procedure: RIGHT SIMPLE MASTECTOMY;  Surgeon: Abigail Miyamoto, MD;   Location: [facility];  Service: General;  Laterality: Right;   Patient Active Problem List   Diagnosis Date Noted   S/P mastectomy, right 11/01/2022   Genetic testing 06/05/2022   Neutropenia, drug-induced (HCC) 05/28/2022   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) 03/05/2022   Encounter for screening fecal occult blood testing 03/01/2022   Routine cervical smear 03/01/2022   Axillary adenopathy 02/20/2022   Mass of lower outer quadrant of right breast 01/30/2022   Cat bite of index finger 09/12/2021   Gastroesophageal reflux disease without esophagitis 10/20/2019   GAD (generalized anxiety disorder) 10/20/2019   Insomnia due to medical condition 10/20/2019   Hemiparesis affecting right side as late effect of cerebrovascular accident (CVA) (HCC) 04/22/2015   Diabetes mellitus type II, controlled (HCC) 12/23/2014   Hypertension 12/23/2014    PCP: Mechele Claude, MD  REFERRING PROVIDER: Lillard Anes, NP  REFERRING DIAG:  Diagnosis  C50.811,Z17.1 (ICD-10-CM) - Malignant neoplasm of overlapping sites of right breast in female, estrogen receptor negative (HCC)  C50.811 (ICD-10-CM) - Malignant neoplasm of overlapping sites of right female breast, unspecified estrogen receptor status (HCC)   THERAPY DIAG:  Aftercare following surgery for neoplasm  Difficulty in walking, not elsewhere classified  ONSET DATE: 02/20/22  Rationale for Evaluation and Treatment: Rehabilitation  SUBJECTIVE:  SUBJECTIVE STATEMENT:  My legs were really sore after last visit. I think it was from the leg press. I am finding myself doing a lot more now than I've been able to do in for awhile. I'm lifting cat and dog food bags and walking around my yard doing yard work. I'm happy to  find myself being able to do more again.   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 It measures 3.8 cm and is located in the inner lower quadrant. 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 2 nodes taken at biopsy - 1 was negative the other was positive. Had blood transfusion on 10/05/22. Rt mastectomy and SLNB on 11/01/22 2/6 nodes positive. Currently in radiation and getting Keytruda.  Hx of DVT and stroke in 2015.    PAIN:  Are you having pain? No  PRECAUTIONS: Right lymphedema risk   RED FLAGS: None   WEIGHT BEARING RESTRICTIONS: No  FALLS:  Has patient fallen in last 6 months? No I do have a fear of falling.  I can feel like my legs with crossed up,  LIVING ENVIRONMENT: Lives with: lives with their family - husband and son Lives in: House/apartment Has following equipment at home: Single point cane, Counselling psychologist, Environmental consultant - 2 wheeled, shower chair, and bed side commode  OCCUPATION: on disability since stroke 2015  LEISURE: I get up and down the stairs, trying to walk outside   HAND DOMINANCE: right   PRIOR LEVEL OF FUNCTION: Independent  PATIENT GOALS: Improve my balance - when I get up I sway a little bit      OBJECTIVE: Note: Objective measures were completed at Evaluation unless otherwise noted.  COGNITION: Overall cognitive status: Within functional limits for tasks assessed   SENSATION: WNL with darcofoot testing bil WNL vibration testing  POSTURE: rounded shoulders   MMT: Seated:  knees and ankles 4+/5 Hip Flexion 4+/5 bil Hip abduction 4/5 bil  FUNCTIONAL TESTS:  Reports no trouble with endurance - omitted 6 min walk   BERG: 45/56 See flow sheet for details  37-45 = Significant (>80%) fall risk  Score 44 - 46.5 = patient should use cane indoor   DGI: 22: See flow sheet for detials:  Dynamic Gait Index Scores of 19 or less are  predictive of falls in older community living adults    GAIT: Distance walked: in clinic Assistive device utilized: none Level of assistance: Complete Independence Comments: appearance of Rt knee flexion during stance time, Slight wide BOS  L-DEX LYMPHEDEMA SCREENING: Currently getting SOZO - in the green                                                                                                                            TREATMENT DATE:  08/20/23: Therapeutic Exercises NuStep Level 5 UE 9/LE 5 x 5:30 mins,, then level 4 x  3:30 mins for a total of 9 mins and 523 steps HS and piriformis stretch x 2 reps, 20-30 sec each leg, then calf stretch in // bars with foot on incline and in a lunge, x 2 reps, 20 sec each leg Wall Push Ups x 12 returning therapist demo for UE strength Neuro Re Ed In // bars: Heel - toe front and retro walking x 2 laps each, then grape[place] Rt and Lt x 2 laps each way, then slow and controlled high knee marching x 2 laps; then toe walking x 2 laps, and heel walking x 2 laps Pt stood in corner on purple balance pad x 2 mins for ball toss at varying heights 4" step up x 10 reps with 3" SLS with encouragement for decrease UE support as able   08/13/23: Therapeutic Exercises NuStep Level 4 UE 9/LE 5 x 9 mins, 523 steps Bil leg press 50# 2 x 10 with short rest between sets Gastroc Stretch in // bars x 2 reps, 20 sec  Seated HS stretch x 2 reps, 20 sec  Neuro Re Ed Free Motion machine: Rows with 7# with core engaged and for postural strength to promote improved balance In // bars: Front and retro tandem walking x 3 laps each with VC's initially to remind pt to look straight ahead and for slower pace but this was some improved today At side of TM for handrail: 6" step ups with 3" SLS on step with each rep x 10 each leg with +1 HHA SLS on blue oval with 1# on each ankle for bil LE 3 way raises x 10 each with demo and tactile cues during to remind pt of correct  technique, increased LE fatigue at this point so stopped  08/06/23: Therapeutic Exercises NuStep Level 4 UE 9/LE 5 x 9 mins, 546 steps Bil leg press 50# 2 x 10 with short rest between sets Gastroc Stretch in // bars x 2 reps, 20 sec  Seated HS stretch x 2 reps, 20 sec  Neuro Re Ed In // bars: Front and retro tandem walking x 4 laps each with VC's throughout to remind pt to look straight ahead and for slower pace, she improved with more reps 6" step ups with 3" SLS on step with each rep x 10 each leg SLS on blue oval with 1# on each ankle for bil LE 3 way raises 2 x 10 each      PATIENT EDUCATION:  Education details: Standing bil LE strength and balance Person educated: Patient Education method: Explanation, demonstration, and handouts issued Education comprehension: verbalized understanding, returned demonstration, VC's and will benefit from further review  HOME EXERCISE PROGRAM: Walking, posture 07/31/23 - Standing bil LE strength and balance activities  ASSESSMENT:  CLINICAL IMPRESSION: Continued with bil LE strength and high level dynamic and static balance activities. Pt was challenged by activities today. Reminded her of importance of incorporating stretches into her day to help decrease muscle soreness she's been feeling with increased activity. Pt is doing well with daily walking and reports was able to lift cat and dog food at store yesterday as she has been feeling stronger in general. Noticed pt struggles with upright posture due to not wearing a bra and she feels uneven since mastectomy. She reports bras from [facility] right after surgery don't help her at all as the prosthesis doesn't stay in place well. Suggested pt make another appt with [facility] to discuss other options and she suggested therapist  to call during session. So during her seated rest break called and left VM for them to call pt.    OBJECTIVE IMPAIRMENTS: Abnormal gait, decreased balance, and  difficulty walking.   ACTIVITY LIMITATIONS: locomotion level  PARTICIPATION LIMITATIONS: none  PERSONAL FACTORS: Age, Fitness, Time since onset of injury/illness/exacerbation, and 1-2 comorbidities: chemo hx  are also affecting patient's functional outcome.   REHAB POTENTIAL: Excellent  CLINICAL DECISION MAKING: Stable/uncomplicated  EVALUATION COMPLEXITY: Low  GOALS: Goals reviewed with patient? Yes  SHORT TERM GOALS=LTGs: Target date: 09/11/23  Pt will  improve BERG balance score to 50/56 to decrease risk of falling  Baseline:45 Goal status: INITIAL  2.  Pt will improve single leg stance to at least 6" on bil legs to decrease risk of falling and improve gait  Baseline: 1-2" bil; 07/31/23 - 2-3" bil Goal status: ONGOING  3.  Pt will be ind with final HEP for continued strength and mobility  Baseline:  Goal status: INITIAL   PLAN:  PT FREQUENCY: 2x/week  PT DURATION: 6 weeks - after radiation ends  PLANNED INTERVENTIONS: 97164- PT Re-evaluation, 97110-Therapeutic exercises, 97530- Therapeutic activity, 97112- Neuromuscular re-education, 97535- Self Care, 16109- Manual therapy, [phone]- Gait training, Patient/Family education, Balance training, Joint mobilization, Therapeutic exercises, Therapeutic activity, Neuromuscular re-education, Gait training, and Self Care  PLAN FOR NEXT SESSION: Cont balance work and general TE. Progress HEP as pt tolerates continuing with encouraging stretches during day. Hear back from [facility]?   Denyce Flank, PTA 08/20/2023, 11:00 AM

## 2023-08-27 ENCOUNTER — Ambulatory Visit: Payer: No Typology Code available for payment source

## 2023-08-27 DIAGNOSIS — Z483 Aftercare following surgery for neoplasm: Secondary | ICD-10-CM | POA: Diagnosis not present

## 2023-08-27 DIAGNOSIS — R262 Difficulty in walking, not elsewhere classified: Secondary | ICD-10-CM

## 2023-08-27 NOTE — Therapy (Signed)
 OUTPATIENT PHYSICAL THERAPY   ONCOLOGY TREATMENT  Patient Name: Marilyn Ford MRN: [id number] DOB:05-16-1960, 63 y.o., female Today's Date: 08/27/2023  END OF SESSION:  PT End of Session - 08/27/23 1007     Visit Number 6    Number of Visits 13    Date for PT Re-Evaluation 09/11/23    PT Start Time 1002    PT Stop Time 1058    PT Time Calculation (min) 56 min    Activity Tolerance Patient tolerated treatment well    Behavior During Therapy WFL for tasks assessed/performed              Past Medical History:  Diagnosis Date   Allergy    Anxiety    Breast cancer (HCC)    Diabetes mellitus without complication (HCC)    GERD (gastroesophageal reflux disease)    Heart murmur    as a child   History of kidney stones    Hypertension    Pneumonia    Stroke [facility])    Vaginal Pap smear, abnormal    Past Surgical History:  Procedure Laterality Date   BREAST BIOPSY  10/31/2022   [place]  RT RADIOACTIVE SEED LOC 10/31/2022 [facility]   CESAREAN SECTION     CHOLECYSTECTOMY     COLONOSCOPY WITH PROPOFOL  N/A 07/19/2020   Procedure: COLONOSCOPY WITH PROPOFOL ;  Surgeon: Urban Garden, MD;  [facility];  Service: Gastroenterology;  Laterality: N/A;  AM   POLYPECTOMY  07/19/2020   Procedure: POLYPECTOMY;  Surgeon: Urban Garden, MD;  [facility];  Service: Gastroenterology;;   PORTACATH PLACEMENT N/A 03/20/2022   Procedure: INSERTION PORT-A-CATH;  Surgeon: Oza Blumenthal, MD;  [facility];  Service: General;  Laterality: N/A;   RADIOACTIVE SEED GUIDED AXILLARY SENTINEL LYMPH NODE Right 11/01/2022   Procedure: RADIOACTIVE SEED GUIDED RIGHT AXILLARY SENTINEL LYMPH NODE DISSECTION;  Surgeon: Oza Blumenthal, MD;  Location: [facility];  Service: General;  Laterality: Right;   SIMPLE MASTECTOMY WITH AXILLARY SENTINEL NODE BIOPSY Right 11/01/2022   Procedure: RIGHT SIMPLE MASTECTOMY;  Surgeon: Oza Blumenthal, MD;   Location: [facility];  Service: General;  Laterality: Right;   Patient Active Problem List   Diagnosis Date Noted   S/P mastectomy, right 11/01/2022   Genetic testing 06/05/2022   Neutropenia, drug-induced (HCC) 05/28/2022   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) 03/05/2022   Encounter for screening fecal occult blood testing 03/01/2022   Routine cervical smear 03/01/2022   Axillary adenopathy 02/20/2022   Mass of lower outer quadrant of right breast 01/30/2022   Cat bite of index finger 09/12/2021   Gastroesophageal reflux disease without esophagitis 10/20/2019   GAD (generalized anxiety disorder) 10/20/2019   Insomnia due to medical condition 10/20/2019   Hemiparesis affecting right side as late effect of cerebrovascular accident (CVA) (HCC) 04/22/2015   Diabetes mellitus type II, controlled (HCC) 12/23/2014   Hypertension 12/23/2014    PCP: Roise Cleaver, MD  REFERRING PROVIDER: Alwin Baars, NP  REFERRING DIAG:  Diagnosis  C50.811,Z17.1 (ICD-10-CM) - Malignant neoplasm of overlapping sites of right breast in female, estrogen receptor negative (HCC)  C50.811 (ICD-10-CM) - Malignant neoplasm of overlapping sites of right female breast, unspecified estrogen receptor status (HCC)   THERAPY DIAG:  Aftercare following surgery for neoplasm  Difficulty in walking, not elsewhere classified  ONSET DATE: 02/20/22  Rationale for Evaluation and Treatment: Rehabilitation  SUBJECTIVE:  SUBJECTIVE STATEMENT:  I keep noticing I'm doing better and better walking around my yard and watering my plants. My neighbor even said she noticed an improvement with my walking. [facility] called me back and I have an appt May 6.  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 It measures 3.8 cm and is located in the inner lower quadrant. 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 2 nodes taken at biopsy - 1 was negative the other was positive. Had blood transfusion on 10/05/22. Rt mastectomy and SLNB on 11/01/22 2/6 nodes positive. Currently in radiation and getting Keytruda .  Hx of DVT and stroke in 2015.    PAIN:  Are you having pain? No  PRECAUTIONS: Right lymphedema risk   RED FLAGS: None   WEIGHT BEARING RESTRICTIONS: No  FALLS:  Has patient fallen in last 6 months? No I do have a fear of falling.  I can feel like my legs with crossed up,  LIVING ENVIRONMENT: Lives with: lives with their family - husband and son Lives in: House/apartment Has following equipment at home: Single point cane, Counselling psychologist, Environmental consultant - 2 wheeled, shower chair, and bed side commode  OCCUPATION: on disability since stroke 2015  LEISURE: I get up and down the stairs, trying to walk outside   HAND DOMINANCE: right   PRIOR LEVEL OF FUNCTION: Independent  PATIENT GOALS: Improve my balance - when I get up I sway a little bit      OBJECTIVE: Note: Objective measures were completed at Evaluation unless otherwise noted.  COGNITION: Overall cognitive status: Within functional limits for tasks assessed   SENSATION: WNL with darcofoot testing bil WNL vibration testing  POSTURE: rounded shoulders   MMT: Seated:  knees and ankles 4+/5 Hip Flexion 4+/5 bil Hip abduction 4/5 bil  FUNCTIONAL TESTS:  Reports no trouble with endurance - omitted 6 min walk   BERG: 45/56 See flow sheet for details  37-45 = Significant (>80%) fall risk  Score 44 - 46.5 = patient should use cane indoor   DGI: 22: See flow sheet for detials:  Dynamic Gait Index Scores of 19 or less are predictive of falls in older community living adults    GAIT: Distance  walked: in clinic Assistive device utilized: none Level of assistance: Complete Independence Comments: appearance of Rt knee flexion during stance time, Slight wide BOS  L-DEX LYMPHEDEMA SCREENING: Currently getting SOZO - in the green                                                                                                                            TREATMENT DATE:  08/27/23: Therapeutic Exercises NuStep Level 5 UE 9/LE level 4 x 3:30 mins for a total of 10:30 mins and 694 steps HS and piriformis stretch x 2 reps, 20-30 sec each leg, then calf stretch in //  bars with foot on incline and in a lunge, x 2 reps, 20 sec each leg Neuro Re Ed In // bars: Heel - toe front x 3 laps and retro walking x 2 laps, then grapevine Rt and Lt x 2 laps each way, then slow and controlled high knee marching x 2 laps; then toe walking x 7 laps (pt reports feeling stronger with this today than last session), lunge walking x 3 laps returning therapist demo 6" step up x 10 reps with 3" SLS  Pt stood in corner on purple balance pad x 2 mins for ball toss at varying heights Therapeutic Activities In // bars for bil hip 4 way raises against yellow theraband resistance x 10 each with returning therapist demo for each and VC's throughout for correct direction and position of LE. +2 UE support throughout required [facility] 7# for rows 2 x 10, VC's for core engagement throughout and for scapular retraction at end of motion  08/20/23: Therapeutic Exercises NuStep Level 5 UE 9/LE 5 x 5:30 mins,, then level 4 x 3:30 mins for a total of 9 mins and 523 steps HS and piriformis stretch x 2 reps, 20-30 sec each leg, then calf stretch in // bars with foot on incline and in a lunge, x 2 reps, 20 sec each leg Wall Push Ups x 12 returning therapist demo for UE strength Neuro Re Ed In // bars: Heel - toe front and retro walking x 2 laps each, then grapevine Rt and Lt x 2 laps each way, then slow and controlled high  knee marching x 2 laps; then toe walking x 2 laps, and heel walking x 2 laps Pt stood in corner on purple balance pad x 2 mins for ball toss at varying heights 4" step up x 10 reps with 3" SLS with encouragement for decrease UE support as able   08/13/23: Therapeutic Exercises NuStep Level 4 UE 9/LE 5 x 9 mins, 523 steps Bil leg press 50# 2 x 10 with short rest between sets Gastroc Stretch in // bars x 2 reps, 20 sec  Seated HS stretch x 2 reps, 20 sec  Neuro Re Ed Free Motion machine: Rows with 7# with core engaged and for postural strength to promote improved balance In // bars: Front and retro tandem walking x 3 laps each with VC's initially to remind pt to look straight ahead and for slower pace but this was some improved today At side of TM for handrail: 6" step ups with 3" SLS on step with each rep x 10 each leg with +1 HHA SLS on blue oval with 1# on each ankle for bil LE 3 way raises x 10 each with demo and tactile cues during to remind pt of correct technique, increased LE fatigue at this point so stopped      PATIENT EDUCATION:  Education details: Standing bil LE strength and balance Person educated: Patient Education method: Explanation, demonstration, and handouts issued Education comprehension: verbalized understanding, returned demonstration, VC's and will benefit from further review  HOME EXERCISE PROGRAM: Walking, posture 07/31/23 - Standing bil LE strength and balance activities  ASSESSMENT:  CLINICAL IMPRESSION: Pt has gotten an appt at [facility] for new bras and possibly prosthesis May 6. Today continued with high level static and dynamic balance activities. Pt was challenged by these and reports will cont working on walking on her toes at home as she likes the challenge of this. Encouraged her to cont stretching as well  as she is using her muscles more which can increase soreness. Pt is progressing very well overall with bil LE strength and endurance.     OBJECTIVE IMPAIRMENTS: Abnormal gait, decreased balance, and difficulty walking.   ACTIVITY LIMITATIONS: locomotion level  PARTICIPATION LIMITATIONS: none  PERSONAL FACTORS: Age, Fitness, Time since onset of injury/illness/exacerbation, and 1-2 comorbidities: chemo hx  are also affecting patient's functional outcome.   REHAB POTENTIAL: Excellent  CLINICAL DECISION MAKING: Stable/uncomplicated  EVALUATION COMPLEXITY: Low  GOALS: Goals reviewed with patient? Yes  SHORT TERM GOALS=LTGs: Target date: 09/11/23  Pt will  improve BERG balance score to 50/56 to decrease risk of falling  Baseline:45 Goal status: INITIAL  2.  Pt will improve single leg stance to at least 6" on bil legs to decrease risk of falling and improve gait  Baseline: 1-2" bil; 07/31/23 - 2-3" bil Goal status: ONGOING  3.  Pt will be ind with final HEP for continued strength and mobility  Baseline:  Goal status: INITIAL   PLAN:  PT FREQUENCY: 2x/week  PT DURATION: 6 weeks - after radiation ends  PLANNED INTERVENTIONS: 97164- PT Re-evaluation, 97110-Therapeutic exercises, 97530- Therapeutic activity, 97112- Neuromuscular re-education, 97535- Self Care, [id number]- Manual therapy, [phone]- Gait training, Patient/Family education, Balance training, Joint mobilization, Therapeutic exercises, Therapeutic activity, Neuromuscular re-education, Gait training, and Self Care  PLAN FOR NEXT SESSION: Cont balance work and general TE. Progress HEP as pt tolerates continuing with encouraging stretches during day. Hear back from [facility]?   Denyce Flank, PTA 08/27/2023, 12:10 PM

## 2023-09-03 ENCOUNTER — Encounter: Payer: Self-pay | Admitting: Rehabilitation

## 2023-09-03 ENCOUNTER — Ambulatory Visit: Payer: No Typology Code available for payment source | Admitting: Rehabilitation

## 2023-09-03 DIAGNOSIS — R262 Difficulty in walking, not elsewhere classified: Secondary | ICD-10-CM

## 2023-09-03 DIAGNOSIS — Z483 Aftercare following surgery for neoplasm: Secondary | ICD-10-CM

## 2023-09-03 NOTE — Therapy (Signed)
 OUTPATIENT PHYSICAL THERAPY   ONCOLOGY TREATMENT  Patient Name: Marilyn Ford MRN: [id number] DOB:10/30/1960, 62 y.o., female Today's Date: 09/03/2023  END OF SESSION:  PT End of Session - 09/03/23 1041     Visit Number 7    Number of Visits 19    Date for PT Re-Evaluation 10/15/23    Authorization Type none needed    PT Start Time 1000    PT Stop Time 1055    PT Time Calculation (min) 55 min    Activity Tolerance Patient tolerated treatment well    Behavior During Therapy WFL for tasks assessed/performed               Past Medical History:  Diagnosis Date   Allergy    Anxiety    Breast cancer (HCC)    Diabetes mellitus without complication (HCC)    GERD (gastroesophageal reflux disease)    Heart murmur    as a child   History of kidney stones    Hypertension    Pneumonia    Stroke [facility])    Vaginal Pap smear, abnormal    Past Surgical History:  Procedure Laterality Date   BREAST BIOPSY  10/31/2022   US  RT RADIOACTIVE SEED LOC 10/31/2022 [facility]   CESAREAN SECTION     CHOLECYSTECTOMY     COLONOSCOPY WITH PROPOFOL  N/A 07/19/2020   Procedure: COLONOSCOPY WITH PROPOFOL ;  Surgeon: Urban Garden, MD;  Location: [facility];  Service: Gastroenterology;  Laterality: N/A;  AM   POLYPECTOMY  07/19/2020   Procedure: POLYPECTOMY;  Surgeon: Urban Garden, MD;  Location: [facility];  Service: Gastroenterology;;   PORTACATH PLACEMENT N/A 03/20/2022   Procedure: INSERTION PORT-A-CATH;  Surgeon: Oza Blumenthal, MD;  Location: [facility];  Service: General;  Laterality: N/A;   RADIOACTIVE SEED GUIDED AXILLARY SENTINEL LYMPH NODE Right 11/01/2022   Procedure: RADIOACTIVE SEED GUIDED RIGHT AXILLARY SENTINEL LYMPH NODE DISSECTION;  Surgeon: Oza Blumenthal, MD;  Location: [facility];  Service: General;  Laterality: Right;   SIMPLE MASTECTOMY WITH AXILLARY SENTINEL NODE BIOPSY Right 11/01/2022   Procedure: RIGHT SIMPLE  MASTECTOMY;  Surgeon: Oza Blumenthal, MD;  Location: [facility];  Service: General;  Laterality: Right;   Patient Active Problem List   Diagnosis Date Noted   S/P mastectomy, right 11/01/2022   Genetic testing 06/05/2022   Neutropenia, drug-induced (HCC) 05/28/2022   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) 03/05/2022   Encounter for screening fecal occult blood testing 03/01/2022   Routine cervical smear 03/01/2022   Axillary adenopathy 02/20/2022   Mass of lower outer quadrant of right breast 01/30/2022   Cat bite of index finger 09/12/2021   Gastroesophageal reflux disease without esophagitis 10/20/2019   GAD (generalized anxiety disorder) 10/20/2019   Insomnia due to medical condition 10/20/2019   Hemiparesis affecting right side as late effect of cerebrovascular accident (CVA) (HCC) 04/22/2015   Diabetes mellitus type II, controlled (HCC) 12/23/2014   Hypertension 12/23/2014    PCP: Roise Cleaver, MD  REFERRING PROVIDER: Alwin Baars, NP  REFERRING DIAG:  Diagnosis  C50.811,Z17.1 (ICD-10-CM) - Malignant neoplasm of overlapping sites of right breast in female, estrogen receptor negative (HCC)  C50.811 (ICD-10-CM) - Malignant neoplasm of overlapping sites of right female breast, unspecified estrogen receptor status (HCC)   THERAPY DIAG:  Aftercare following surgery for neoplasm  Difficulty in walking, not elsewhere classified  ONSET DATE: 02/20/22  Rationale for Evaluation and Treatment: Rehabilitation  SUBJECTIVE:  SUBJECTIVE STATEMENT:  I am doing better but don't want to be done yet.   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 It measures 3.8 cm and is located in the inner lower quadrant. 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 2 nodes taken at biopsy - 1 was negative the other was positive. Had blood transfusion on 10/05/22. Rt mastectomy and SLNB on 11/01/22 2/6 nodes positive. Currently in radiation and getting Keytruda .  Hx of DVT and stroke in 2015.    PAIN:  Are you having pain? No  PRECAUTIONS: Right lymphedema risk   RED FLAGS: None   WEIGHT BEARING RESTRICTIONS: No  FALLS:  Has patient fallen in last 6 months? No I do have a fear of falling.  I can feel like my legs with crossed up,  LIVING ENVIRONMENT: Lives with: lives with their family - husband and son Lives in: House/apartment Has following equipment at home: Single point cane, Counselling psychologist, Environmental consultant - 2 wheeled, shower chair, and bed side commode  OCCUPATION: on disability since stroke 2015  LEISURE: I get up and down the stairs, trying to walk outside   HAND DOMINANCE: right   PRIOR LEVEL OF FUNCTION: Independent  PATIENT GOALS: Improve my balance - when I get up I sway a little bit      OBJECTIVE: Note: Objective measures were completed at Evaluation unless otherwise noted.  COGNITION: Overall cognitive status: Within functional limits for tasks assessed   SENSATION: WNL with darcofoot testing bil WNL vibration testing  POSTURE: rounded shoulders  09/03/23: Standing: bil genu recurvatum with most weight on the Left leg  MMT: Seated:  knees and ankles 4+/5 Hip Flexion 4+/5 bil Hip abduction 4/5 bil  Flexibility: 09/03/23: Hamstrings: Rt to 65, Lt to 45 Calf in supine: DF to neutral only bil  FUNCTIONAL TESTS:  Reports no trouble with endurance - omitted 6 min walk   BERG: 45/56 See flow sheet for details  37-45 = Significant (>80%) fall risk  Score 44 - 46.5 = patient should use cane indoor  09/03/23: remains 45/56   DGI: 22: See flow sheet for detials:  Dynamic Gait Index Scores of 19 or less are  predictive of falls in older community living adults    GAIT: Distance walked: in clinic Assistive device utilized: none Level of assistance: Complete Independence Comments: appearance of Rt knee flexion during stance time, Slight wide BOS  L-DEX LYMPHEDEMA SCREENING: Currently getting SOZO - in the green                                                                                                                            TREATMENT DATE:  09/03/23: Therapeutic Exercises NuStep UE 9/LE level 4 x 11 mins and 670 steps - noticing that pts left foot does not want to stay down on the plate and pt  discussing how she has trouble getting her shoes on because her foot doesn't want to go flat.   In supine checked hamstring and calf flexibility see above Negative clonus Performed hamstring stretch with strap 2x20" bil with PT minA holding heel - and trying to add some DF with the pull.  Seated hamstring stretch x 20" each Standing wall calf stretch 2x20" bil with education  Added both to HEP Assessed standing posture without shoes: pt stands in genu recurvatum with most weight on the left leg DL squat: loses balance posteriorly but able to correct Neuro Re Ed In parallel bars: redid Berg balance scale and checked goals Then heel walking x 2 lengths and 2 lengths of toe walking with HHA PRN Then on trampoline weight shift Rt / Lt without hands with pt having trouble shifting weight vs just leaning trunk Weight shifts in stance position forward and backward but this was hard so we moved to the floor to do this.   08/27/23: Therapeutic Exercises NuStep Level 5 UE 9/LE level 4 x 3:30 mins for a total of 10:30 mins and 694 steps HS and piriformis stretch x 2 reps, 20-30 sec each leg, then calf stretch in // bars with foot on incline and in a lunge, x 2 reps, 20 sec each leg Neuro Re Ed In // bars: Heel - toe front x 3 laps and retro walking x 2 laps, then [place] Rt and Lt x 2 laps each way,  then slow and controlled high knee marching x 2 laps; then toe walking x 7 laps (pt reports feeling stronger with this today than last session), lunge walking x 3 laps returning therapist demo 6" step up x 10 reps with 3" SLS  Pt stood in corner on purple balance pad x 2 mins for ball toss at varying heights Therapeutic Activities In // bars for bil hip 4 way raises against yellow theraband resistance x 10 each with returning therapist demo for each and VC's throughout for correct direction and position of LE. +2 UE support throughout required [facility] 7# for rows 2 x 10, VC's for core engagement throughout and for scapular retraction at end of motion  08/20/23: Therapeutic Exercises NuStep Level 5 UE 9/LE 5 x 5:30 mins,, then level 4 x 3:30 mins for a total of 9 mins and 523 steps HS and piriformis stretch x 2 reps, 20-30 sec each leg, then calf stretch in // bars with foot on incline and in a lunge, x 2 reps, 20 sec each leg Wall Push Ups x 12 returning therapist demo for UE strength Neuro Re Ed In // bars: Heel - toe front and retro walking x 2 laps each, then [place] Rt and Lt x 2 laps each way, then slow and controlled high knee marching x 2 laps; then toe walking x 2 laps, and heel walking x 2 laps Pt stood in corner on purple balance pad x 2 mins for ball toss at varying heights 4" step up x 10 reps with 3" SLS with encouragement for decrease UE support as able   08/13/23: Therapeutic Exercises NuStep Level 4 UE 9/LE 5 x 9 mins, 523 steps Bil leg press 50# 2 x 10 with short rest between sets Gastroc Stretch in // bars x 2 reps, 20 sec  Seated HS stretch x 2 reps, 20 sec  Neuro Re Ed Free Motion machine: Rows with 7# with core engaged and for postural strength to promote improved balance In // bars: [employer]  and retro tandem walking x 3 laps each with VC's initially to remind pt to look straight ahead and for slower pace but this was some improved today At side of TM for  handrail: 6" step ups with 3" SLS on step with each rep x 10 each leg with +1 HHA SLS on blue oval with 1# on each ankle for bil LE 3 way raises x 10 each with demo and tactile cues during to remind pt of correct technique, increased LE fatigue at this point so stopped  PATIENT EDUCATION:  Education details: Standing bil LE strength and balance Person educated: Patient Education method: Explanation, demonstration, and handouts issued Education comprehension: verbalized understanding, returned demonstration, VC's and will benefit from further review  HOME EXERCISE PROGRAM: Walking, posture 07/31/23 - Standing bil LE strength and balance activities Access Code: [email or web address] URL: [email or web address] Date: 09/03/2023 Prepared by: Judy Null  Exercises - Seated Hamstring Stretch  - 1-3 x daily - 7 x weekly - 1 sets - 3 reps - 20-30 seconds hold - Gastroc Stretch on Wall  - 1-3 x daily - 7 x weekly - 1 sets - 3 reps - 20-30 seconds hold  ASSESSMENT:  CLINICAL IMPRESSION: Will extend POC as pt feels like she is making progress but has yet to meet her goals. She continues to struggle with LE tightness especially the left calf and hamstring, general weakness, and balance difficulties especially with single leg.  Noted today pts trouble weight shifting and discussed how this needs to improve first before she get really improve her balance.  Extended POC x 6 weeks.   OBJECTIVE IMPAIRMENTS: Abnormal gait, decreased balance, and difficulty walking.   ACTIVITY LIMITATIONS: locomotion level  PARTICIPATION LIMITATIONS: none  PERSONAL FACTORS: Age, Fitness, Time since onset of injury/illness/exacerbation, and 1-2 comorbidities: chemo hx  are also affecting patient's functional outcome.   REHAB POTENTIAL: Excellent  CLINICAL DECISION MAKING: Stable/uncomplicated  EVALUATION COMPLEXITY: Low  GOALS: Goals reviewed with patient? Yes  SHORT TERM GOALS=LTGs: Target date: 10/15/23  Pt  will  improve BERG balance score to 50/56 to decrease risk of falling  Baseline:45 Goal status: ONGOING  2.  Pt will improve single leg stance to at least 6" on bil legs to decrease risk of falling and improve gait  Baseline: 1-2" bil; 07/31/23 - 2-3" bil, 09/03/23 1-2" Goal status: ONGOING  3.  Pt will be ind with final HEP for continued strength and mobility  Baseline:  Goal status: ONGOING   PLAN:  PT FREQUENCY: 2x/week  PT DURATION: 6 weeks - after radiation ends  PLANNED INTERVENTIONS: [id number]- PT Re-evaluation, 97110-Therapeutic exercises, 97530- Therapeutic activity, 97112- Neuromuscular re-education, 97535- Self Care, [id number]- Manual therapy, [phone]- Gait training, Patient/Family education, Balance training, Joint mobilization, Therapeutic exercises, Therapeutic activity, Neuromuscular re-education, Gait training, and Self Care  PLAN FOR NEXT SESSION: Cont balance work and general TE. Work on [employer] shifting and pregait activities. Stretching calf and hamstrings.    Encarnacion Harris, PT 09/03/2023, 11:07 AM

## 2023-09-05 ENCOUNTER — Encounter: Payer: No Typology Code available for payment source | Admitting: Rehabilitation

## 2023-09-09 ENCOUNTER — Ambulatory Visit (INDEPENDENT_AMBULATORY_CARE_PROVIDER_SITE_OTHER): Payer: No Typology Code available for payment source

## 2023-09-09 VITALS — Ht 62.0 in | Wt 164.0 lb

## 2023-09-09 DIAGNOSIS — Z0000 Encounter for general adult medical examination without abnormal findings: Secondary | ICD-10-CM

## 2023-09-09 DIAGNOSIS — C50911 Malignant neoplasm of unspecified site of right female breast: Secondary | ICD-10-CM | POA: Diagnosis not present

## 2023-09-09 NOTE — Progress Notes (Signed)
 Subjective:   Marilyn Ford is a 63 y.o. who presents for a Medicare Wellness preventive visit.  Visit Complete: Virtual I connected with  Marilyn Ford on 09/09/23 by a audio enabled telemedicine application and verified that I am speaking with the correct person using two identifiers.  Patient Location: Home  Provider Location: Home Office  I discussed the limitations of evaluation and management by telemedicine. The patient expressed understanding and agreed to proceed.  Vital Signs: Because this visit was a virtual/telehealth visit, some criteria may be missing or patient reported. Any vitals not documented were not able to be obtained and vitals that have been documented are patient reported.  VideoDeclined- This patient declined Librarian, academic. Therefore the visit was completed with audio only.  Persons Participating in Visit: Patient.  AWV Questionnaire: Yes: Patient Medicare AWV questionnaire was completed by the patient on 09/06/23; I have confirmed that all information answered by patient is correct and no changes since this date.  Cardiac Risk Factors include: dyslipidemia;diabetes mellitus;hypertension     Objective:    Today's Vitals   09/09/23 1004  Weight: 164 lb (74.4 kg)  Height: 5\' 2"  (1.575 m)   Body mass index is 30 kg/m.     09/09/2023   10:10 AM 08/15/2023    3:37 PM 06/19/2023   11:40 AM 05/16/2023    9:17 AM 05/13/2023   11:51 AM 03/28/2023    8:11 AM 03/07/2023   11:14 AM  Advanced Directives  Does Patient Have a Medical Advance Directive? No No No No No No No  Would patient like information on creating a medical advance directive? Yes (MAU/Ambulatory/Procedural Areas - Information given) No - Patient declined No - Patient declined Yes (ED - Information included in AVS) No - Patient declined No - Patient declined No - Patient declined    Current Medications (verified) Outpatient Encounter Medications as of  09/09/2023  Medication Sig   Alcohol Swabs (B-D SINGLE USE SWABS REGULAR) PADS Test BS daily and as needed Dx E11.9   aluminum -magnesium  hydroxide-simethicone  (MAALOX) 200-200-20 MG/5ML SUSP Take 30 mLs by mouth 4 (four) times daily -  before meals and at bedtime.   amLODipine  (NORVASC ) 10 MG tablet 1 TABLET DAILY   Apple Cider Vinegar 500 MG TABS Take 500 mg by mouth in the morning.   aspirin  81 MG EC tablet Take 1 tablet (81 mg total) by mouth daily.   atorvastatin  (LIPITOR ) 80 MG tablet TAKE 1 TABLET EVERY DAY AT 6PM   betamethasone  dipropionate 0.05 % cream Apply topically 2 (two) times daily.   Blood Glucose Calibration (TRUE METRIX LEVEL 1) Low SOLN Use with glucometer Dx E11.9   Blood Glucose Monitoring Suppl (TRUE METRIX AIR GLUCOSE METER) w/Device KIT Test BS daily and as needed Dx E11.9   Calcium  Carb-Cholecalciferol (CALCIUM -VITAMIN D) 600-400 MG-UNIT TABS Take 1 tablet by mouth in the morning.   Coenzyme Q10 (COQ10) 100 MG CAPS Take 100 mg by mouth in the morning.   Cranberry 425 MG CAPS Take 425 mg by mouth in the morning and at bedtime.   diphenoxylate -atropine  (LOMOTIL ) 2.5-0.025 MG tablet Take 1 tablet by mouth 4 (four) times daily as needed for diarrhea or loose stools.   Flaxseed, Linseed, (FLAXSEED OIL) 1000 MG CAPS Take 1,000 mg by mouth in the morning.   glucose blood (TRUE METRIX BLOOD GLUCOSE TEST) test strip Test BS daily and as needed Dx E11.9   icosapent  Ethyl (VASCEPA ) 1 g capsule Take 2  g by mouth 2 (two) times daily.   Krill Oil 500 MG CAPS Take 3 capsules (1,500 mg total) by mouth in the morning and at bedtime.   lidocaine  (XYLOCAINE ) 2 % solution Use as directed 15 mLs in the mouth or throat every 6 (six) hours as needed for mouth pain.   lidocaine -prilocaine  (EMLA ) cream Apply 1 Application topically as needed.   magnesium  oxide (MAG-OX) 400 (240 Mg) MG tablet TAKE 1 TABLET BY MOUTH TWICE A DAY   meloxicam  (MOBIC ) 15 MG tablet TAKE 1 TABLET (15 MG TOTAL) BY MOUTH  DAILY FOR JOINT AND MUSCLE PAIN   metoprolol  (TOPROL -XL) 200 MG 24 hr tablet TAKE 1 TABLET ONE TIME DAILY, WITH OR IMMEDIATELY FOLLOWING A MEAL   Multiple Vitamin (MULTIVITAMIN) capsule Take 1 capsule by mouth in the morning.   ondansetron  (ZOFRAN ) 8 MG tablet Take 1 tablet (8 mg total) by mouth every 8 (eight) hours as needed for nausea or vomiting.   pantoprazole  (PROTONIX ) 40 MG tablet TAKE 1 TABLET TWICE DAILY FOR STOMACH   traZODone  (DESYREL ) 150 MG tablet TAKE 1 OR 2 TABLETS AT BEDTIME FOR SLEEP   TRUEplus Lancets 33G MISC Test BS daily and as needed Dx E11.9   valsartan  (DIOVAN ) 320 MG tablet Take 1 tablet (320 mg total) by mouth daily. For blood pressure.   zinc  gluconate 50 MG tablet Take 50 mg by mouth daily.   Zinc  Oxide 20 % PSTE Apply 1 Application topically 3 (three) times daily.   [DISCONTINUED] escitalopram  (LEXAPRO ) 10 MG tablet TAKE 1 TABLET BY MOUTH EVERY DAY   No facility-administered encounter medications on file as of 09/09/2023.    Allergies (verified) Penicillins and Sulfa antibiotics   History: Past Medical History:  Diagnosis Date   Allergy    Anxiety    Arthritis    Blood transfusion without reported diagnosis    Breast cancer (HCC)    Diabetes mellitus without complication (HCC)    GERD (gastroesophageal reflux disease)    Heart murmur    as a child   History of kidney stones    Hypertension    Pneumonia    Stroke [facility])    Vaginal Pap smear, abnormal    Past Surgical History:  Procedure Laterality Date   BREAST BIOPSY  10/31/2022   US  RT RADIOACTIVE SEED LOC 10/31/2022 [facility] MAMMOGRAP[facility]   CESAREAN SECTION     CHOLECYSTECTOMY     COLONOSCOPY WITH PROPOFOL  N/A 07/19/2020   Procedure: COLONOSCOPY WITH PROPOFOL ;  Surgeon: Urban Garden, MD;  Location: [facility];  Service: Gastroenterology;  Laterality: N/A;  AM   POLYPECTOMY  07/19/2020   Procedure: POLYPECTOMY;  Surgeon: Urban Garden, MD;  Location: [facility];  Service:  Gastroenterology;;   PORTACATH PLACEMENT N/A 03/20/2022   Procedure: INSERTION PORT-A-CATH;  Surgeon: Oza Blumenthal, MD;  Location: [facility];  Service: General;  Laterality: N/A;   RADIOACTIVE SEED GUIDED AXILLARY SENTINEL LYMPH NODE Right 11/01/2022   Procedure: RADIOACTIVE SEED GUIDED RIGHT AXILLARY SENTINEL LYMPH NODE DISSECTION;  Surgeon: Oza Blumenthal, MD;  Location: [facility];  Service: General;  Laterality: Right;   SIMPLE MASTECTOMY WITH AXILLARY SENTINEL NODE BIOPSY Right 11/01/2022   Procedure: RIGHT SIMPLE MASTECTOMY;  Surgeon: Oza Blumenthal, MD;  Location: [facility];  Service: General;  Laterality: Right;   Family History  Problem Relation Age of Onset   Diabetes Mother    Uterine cancer Mother 63 - 82   Diabetes Brother  Breast cancer Maternal Aunt        dx >50, d. from cancer   Cancer Maternal Grandmother        unk type, "back cancer?"   Social History   Socioeconomic History   Marital status: Married    Spouse name: Royston Cornea   Number of children: 2   Years of education: 10   Highest education level: 10th grade  Occupational History   Occupation: disabled  Tobacco Use   Smoking status: Former    Current packs/day: 0.00    Average packs/day: 0.5 packs/day for 25.0 years (12.5 ttl pk-yrs)    Types: Cigarettes    Start date: 12/08/1999    Quit date: 03/12/2015    Years since quitting: 8.5   Smokeless tobacco: Never  Vaping Use   Vaping status: Never Used  Substance and Sexual Activity   Alcohol use: Yes    Alcohol/week: 2.0 - 3.0 standard drinks of alcohol    Types: 2 - 3 Glasses of wine per week   Drug use: No   Sexual activity: Not Currently    Birth control/protection: Abstinence, Post-menopausal  Other Topics Concern   Not on file  Social History Narrative   Volunteers at [employer] for a few hours every day.    She really enjoys getting out of of the house and working there.    Social Drivers of  Corporate investment banker Strain: Low Risk  (09/09/2023)   Overall Financial Resource Strain (CARDIA)    Difficulty of Paying Living Expenses: Not very hard  Food Insecurity: No Food Insecurity (09/09/2023)   Hunger Vital Sign    Worried About Running Out of Food in the Last Year: Never true    Ran Out of Food in the Last Year: Never true  Transportation Needs: No Transportation Needs (09/09/2023)   PRAPARE - Administrator, Civil Service (Medical): No    Lack of Transportation (Non-Medical): No  Physical Activity: Insufficiently Active (09/09/2023)   Exercise Vital Sign    Days of Exercise per Week: 3 days    Minutes of Exercise per Session: 20 min  Stress: No Stress Concern Present (09/09/2023)   [employer] of Occupational Health - Occupational Stress Questionnaire    Feeling of Stress : Only a little  Recent Concern: Stress - Stress Concern Present (06/17/2023)   [employer] of Occupational Health - Occupational Stress Questionnaire    Feeling of Stress : To some extent  Social Connections: Moderately Isolated (09/09/2023)   Social Connection and Isolation Panel [NHANES]    Frequency of Communication with Friends and Family: More than three times a week    Frequency of Social Gatherings with Friends and Family: Twice a week    Attends Religious Services: Never    Database administrator or Organizations: No    Attends Engineer, structural: Never    Marital Status: Married    Tobacco Counseling Counseling given: Not Answered    Clinical Intake:  Pre-visit preparation completed: Yes  Pain : No/denies pain     Diabetes: Yes CBG done?: No Did pt. bring in CBG monitor from home?: No  Lab Results  Component Value Date   HGBA1C 4.5 (L) 06/20/2023   HGBA1C 5.0 03/20/2023   HGBA1C 4.2 (L) 12/17/2022     How often do you need to have someone help you when you read instructions, pamphlets, or other written materials from your doctor or pharmacy?:  1 - Never  Interpreter Needed?: No  Information entered by :: Seabron Cypress LPN   Activities of Daily Living     09/06/2023   11:16 AM 11/01/2022    8:24 AM  In your present state of health, do you have any difficulty performing the following activities:  Hearing? 0 0  Vision? 0 0  Difficulty concentrating or making decisions? 1 0  Walking or climbing stairs? 0 0  Dressing or bathing? 0 0  Doing errands, shopping? 0   Preparing Food and eating ? N   Using the Toilet? N   In the past six months, have you accidently leaked urine? N   Do you have problems with loss of bowel control? N   Managing your Medications? N   Managing your Finances? N   Housekeeping or managing your Housekeeping? N     Patient Care Team: Roise Cleaver, MD as PCP - General (Family Medicine) Roise Cleaver, MD (Family Medicine) Delilah Fend, [facility] as Pharmacist (Family Medicine) Umberto Ganong, Bearl Limes, MD as Consulting Physician (Gastroenterology) Alanda Allegra, MD as Consulting Physician (General Surgery) Auther Bo, RN as Oncology Nurse Navigator Alane Hsu, RN as Oncology Nurse Navigator Paulett Boros, MD as Medical Oncologist (Medical Oncology) Gerhard Knuckles, RN as Oncology Nurse Navigator (Medical Oncology) Paulett Boros, MD as Consulting Physician (Hematology) Roise Cleaver, MD as Referring Physician (Family Medicine)  Indicate any recent Medical Services you may have received from other than [facility] providers in the past year (date may be approximate).     Assessment:   This is a routine wellness examination for Marilyn Ford.  Hearing/Vision screen Hearing Screening - Comments:: Denies hearing difficulties   Vision Screening - Comments:: Wears rx glasses - up to date with routine eye exams with Dr.Le    Goals Addressed             This Visit's Progress    COMPLETED: DIET - INCREASE WATER INTAKE       Try to drink 6-8 glasses of water daily.  Goals  Addressed             This Visit's Progress    DIET - INCREASE WATER INTAKE   On track    Try to drink 6-8 glasses of water daily.            DIET - INCREASE WATER INTAKE   On track      Depression Screen     09/09/2023   10:08 AM 06/20/2023    9:11 AM 03/20/2023   10:11 AM 03/20/2023   10:03 AM 12/17/2022    8:52 AM 12/17/2022    8:47 AM 09/12/2022    8:52 AM  PHQ 2/9 Scores  PHQ - 2 Score 1 1 2  0 3 0 3  PHQ- 9 Score 4 5 8  9  9     Fall Risk     09/06/2023   11:16 AM 03/20/2023   10:11 AM 03/20/2023   10:03 AM 12/17/2022    8:47 AM 09/12/2022    8:52 AM  Fall Risk   Falls in the past year? 0 0 0 0 1  Number falls in past yr: 0    0  Injury with Fall? 0    0  Risk for fall due to : Impaired mobility    History of fall(s)  Follow up Falls prevention discussed;Falls evaluation completed;Education provided    Falls evaluation completed    MEDICARE RISK AT HOME:  Medicare Risk at Home Any stairs in  or around the home?: (Patient-Rptd) Yes If so, are there any without handrails?: (Patient-Rptd) No Home free of loose throw rugs in walkways, pet beds, electrical cords, etc?: (Patient-Rptd) Yes Adequate lighting in your home to reduce risk of falls?: (Patient-Rptd) Yes Life alert?: (Patient-Rptd) No Use of a cane, walker or w/c?: (Patient-Rptd) No Grab bars in the bathroom?: (Patient-Rptd) No Shower chair or bench in shower?: (Patient-Rptd) No Elevated toilet seat or a handicapped toilet?: (Patient-Rptd) No  TIMED UP AND GO:  Was the test performed?  No  Cognitive Function: 6CIT completed        09/09/2023   10:10 AM 09/04/2022    8:53 AM 08/17/2021   12:48 PM 08/16/2020    4:36 PM 01/02/2019    8:58 AM  6CIT Screen  What Year? 0 points 0 points 0 points 0 points 0 points  What month? 0 points 0 points 0 points 0 points 0 points  What time? 0 points 0 points 0 points 0 points 0 points  Count back from 20 0 points 0 points 0 points 0 points 0 points  Months in  reverse 0 points 0 points 4 points 4 points 2 points  Repeat phrase 0 points 0 points 0 points 0 points 0 points  Total Score 0 points 0 points 4 points 4 points 2 points    Immunizations Immunization History  Administered Date(s) Administered   Influenza, Quadrivalent, Recombinant, Inj, Pf 01/18/2019   Influenza,inj,Quad PF,6+ Mos 03/22/2015, 01/30/2017, 02/04/2018, 03/07/2022   Influenza-Unspecified 01/28/2016   PFIZER(Purple Top)SARS-COV-2 Vaccination 08/26/2019, 08/28/2019, 05/04/2020    Screening Tests Health Maintenance  Topic Date Due   Pneumococcal Vaccine 22-2 Years old (1 of 2 - PCV) Never done   Zoster Vaccines- Shingrix (1 of 2) Never done   FOOT EXAM  08/11/2021   COVID-19 Vaccine (4 - 2024-25 season) 01/06/2023   MAMMOGRAM  03/03/2023   Hepatitis C Screening  12/17/2023 (Originally 12/30/1978)   HIV Screening  12/17/2023 (Originally 12[birth year]30/1975)   DTaP/Tdap/Td (1 - Tdap) 03/19/2024 (Originally 12[birth year]30/1979)   INFLUENZA VACCINE  12/06/2023   HEMOGLOBIN A1C  12/18/2023   Diabetic kidney evaluation - Urine ACR  03/19/2024   OPHTHALMOLOGY EXAM  05/24/2024   Diabetic kidney evaluation - eGFR measurement  08/04/2024   Medicare Annual Wellness (AWV)  09/08/2024   Colonoscopy  07/19/2025   DEXA SCAN  02/20/2026   Cervical Cancer Screening (HPV/Pap Cotest)  03/02/2027   HPV VACCINES  Aged Out   Meningococcal B Vaccine  Aged Out    Health Maintenance  Health Maintenance Due  Topic Date Due   Pneumococcal Vaccine 52-2 Years old (1 of 2 - PCV) Never done   Zoster Vaccines- Shingrix (1 of 2) Never done   FOOT EXAM  08/11/2021   COVID-19 Vaccine (4 - 2024-25 season) 01/06/2023   MAMMOGRAM  03/03/2023   Health Maintenance Items Addressed: Scheduled for mammogram 09/10/23; plans to get Shingrix   Additional Screening:  Vision Screening: Recommended annual ophthalmology exams for early detection of glaucoma and other disorders of the eye.  Dental Screening: Recommended  annual dental exams for proper oral hygiene  Community Resource Referral / Chronic Care Management: CRR required this visit?  No   CCM required this visit?  No     Plan:     I have personally reviewed and noted the following in the patient's chart:   Medical and social history Use of alcohol, tobacco or illicit drugs  Current medications and supplements including opioid prescriptions. Patient  is not currently taking opioid prescriptions. Functional ability and status Nutritional status Physical activity Advanced directives List of other physicians Hospitalizations, surgeries, and ER visits in previous 12 months Vitals Screenings to include cognitive, depression, and falls Referrals and appointments  In addition, I have reviewed and discussed with patient certain preventive protocols, quality metrics, and best practice recommendations. A written personalized care plan for preventive services as well as general preventive health recommendations were provided to patient.     Seabron Cypress [place], [place]   05/12/1094   After Visit Summary: (MyChart) Due to this being a telephonic visit, the after visit summary with patients personalized plan was offered to patient via MyChart   Notes: Nothing significant to report at this time.

## 2023-09-09 NOTE — Patient Instructions (Signed)
 Marilyn Ford , Thank you for taking time to come for your Medicare Wellness Visit. I appreciate your ongoing commitment to your health goals. Please review the following plan we discussed and let me know if I can assist you in the future.   Referrals/Orders/Follow-Ups/Clinician Recommendations: Aim for 30 minutes of exercise or brisk walking, 6-8 glasses of water, and 5 servings of fruits and vegetables each day.  This is a list of the screening recommended for you and due dates:  Health Maintenance  Topic Date Due   Pneumococcal Vaccination (1 of 2 - PCV) Never done   Zoster (Shingles) Vaccine (1 of 2) Never done   Complete foot exam   08/11/2021   COVID-19 Vaccine (4 - 2024-25 season) 01/06/2023   Mammogram  03/03/2023   Hepatitis C Screening  12/17/2023*   HIV Screening  12/17/2023*   DTaP/Tdap/Td vaccine (1 - Tdap) 03/19/2024*   Flu Shot  12/06/2023   Hemoglobin A1C  12/18/2023   Yearly kidney health urinalysis for diabetes  03/19/2024   Eye exam for diabetics  05/24/2024   Yearly kidney function blood test for diabetes  08/04/2024   Medicare Annual Wellness Visit  09/08/2024   Colon Cancer Screening  07/19/2025   DEXA scan (bone density measurement)  02/20/2026   Pap with HPV screening  03/02/2027   HPV Vaccine  Aged Out   Meningitis B Vaccine  Aged Out  *Topic was postponed. The date shown is not the original due date.    Advanced directives: (ACP Link)Information on Advanced Care Planning can be found at [place]  Secretary of [facility] Advance Health Care Directives Advance Health Care Directives. [email or web address]   Next Medicare Annual Wellness Visit scheduled for next year: Yes  Have you seen your provider in the last 6 months (3 months if uncontrolled diabetes)? Yes

## 2023-09-10 ENCOUNTER — Other Ambulatory Visit: Payer: Self-pay

## 2023-09-10 ENCOUNTER — Ambulatory Visit (HOSPITAL_COMMUNITY)
Admission: RE | Admit: 2023-09-10 | Discharge: 2023-09-10 | Disposition: A | Discharge status: Home / Self Care | Source: Ambulatory Visit | Attending: Family Medicine | Admitting: Family Medicine

## 2023-09-10 ENCOUNTER — Encounter (HOSPITAL_COMMUNITY): Payer: Self-pay

## 2023-09-10 ENCOUNTER — Other Ambulatory Visit: Payer: Self-pay | Admitting: Family Medicine

## 2023-09-10 DIAGNOSIS — R92312 Mammographic fatty tissue density, left breast: Secondary | ICD-10-CM | POA: Diagnosis not present

## 2023-09-10 DIAGNOSIS — C50811 Malignant neoplasm of overlapping sites of right female breast: Secondary | ICD-10-CM

## 2023-09-10 DIAGNOSIS — Z853 Personal history of malignant neoplasm of breast: Secondary | ICD-10-CM | POA: Diagnosis not present

## 2023-09-11 ENCOUNTER — Ambulatory Visit: Attending: Radiation Oncology

## 2023-09-11 ENCOUNTER — Encounter (HOSPITAL_COMMUNITY): Payer: Self-pay

## 2023-09-11 DIAGNOSIS — E669 Obesity, unspecified: Secondary | ICD-10-CM | POA: Diagnosis not present

## 2023-09-11 DIAGNOSIS — N1831 Chronic kidney disease, stage 3a: Secondary | ICD-10-CM | POA: Diagnosis not present

## 2023-09-11 DIAGNOSIS — F411 Generalized anxiety disorder: Secondary | ICD-10-CM | POA: Diagnosis not present

## 2023-09-11 DIAGNOSIS — E1122 Type 2 diabetes mellitus with diabetic chronic kidney disease: Secondary | ICD-10-CM | POA: Diagnosis not present

## 2023-09-11 DIAGNOSIS — E785 Hyperlipidemia, unspecified: Secondary | ICD-10-CM | POA: Diagnosis not present

## 2023-09-11 DIAGNOSIS — Z008 Encounter for other general examination: Secondary | ICD-10-CM | POA: Diagnosis not present

## 2023-09-11 DIAGNOSIS — R262 Difficulty in walking, not elsewhere classified: Secondary | ICD-10-CM | POA: Diagnosis not present

## 2023-09-11 DIAGNOSIS — Z853 Personal history of malignant neoplasm of breast: Secondary | ICD-10-CM | POA: Diagnosis not present

## 2023-09-11 DIAGNOSIS — Z483 Aftercare following surgery for neoplasm: Secondary | ICD-10-CM | POA: Diagnosis not present

## 2023-09-11 DIAGNOSIS — Z8589 Personal history of malignant neoplasm of other organs and systems: Secondary | ICD-10-CM | POA: Diagnosis not present

## 2023-09-11 DIAGNOSIS — Z6830 Body mass index (BMI) 30.0-30.9, adult: Secondary | ICD-10-CM | POA: Diagnosis not present

## 2023-09-11 DIAGNOSIS — E1169 Type 2 diabetes mellitus with other specified complication: Secondary | ICD-10-CM | POA: Diagnosis not present

## 2023-09-11 DIAGNOSIS — I69351 Hemiplegia and hemiparesis following cerebral infarction affecting right dominant side: Secondary | ICD-10-CM | POA: Diagnosis not present

## 2023-09-11 DIAGNOSIS — C50911 Malignant neoplasm of unspecified site of right female breast: Secondary | ICD-10-CM | POA: Diagnosis not present

## 2023-09-11 NOTE — Therapy (Signed)
 OUTPATIENT PHYSICAL THERAPY   ONCOLOGY TREATMENT  Patient Name: Marilyn Ford MRN: [id number] DOB:1961/05/06, 63 y.o., female Today's Date: 09/11/2023  END OF SESSION:  PT End of Session - 09/11/23 1012     Visit Number 8    Number of Visits 19    Date for PT Re-Evaluation 10/15/23    Authorization Type none needed    PT Start Time 1007    PT Stop Time 1056    PT Time Calculation (min) 49 min    Activity Tolerance Patient tolerated treatment well    Behavior During Therapy [facility] for tasks assessed/performed               Past Medical History:  Diagnosis Date   Allergy    Anxiety    Arthritis    Blood transfusion without reported diagnosis    Breast cancer (HCC) 10/2022   right breast   Diabetes mellitus without complication (HCC)    GERD (gastroesophageal reflux disease)    Heart murmur    as a child   History of kidney stones    Hypertension    Pneumonia    Stroke [facility])    Vaginal Pap smear, abnormal    Past Surgical History:  Procedure Laterality Date   BREAST BIOPSY  10/31/2022   US  RT RADIOACTIVE SEED LOC 10/31/2022 [facility]   CESAREAN SECTION     CHOLECYSTECTOMY     COLONOSCOPY WITH PROPOFOL  N/A 07/19/2020   Procedure: COLONOSCOPY WITH PROPOFOL ;  Surgeon: Urban Garden, MD;  Location: [facility];  Service: Gastroenterology;  Laterality: N/A;  AM   MASTECTOMY Right 2024   POLYPECTOMY  07/19/2020   Procedure: POLYPECTOMY;  Surgeon: Urban Garden, MD;  Location: [facility];  Service: Gastroenterology;;   PORTACATH PLACEMENT N/A 03/20/2022   Procedure: INSERTION PORT-A-CATH;  Surgeon: Oza Blumenthal, MD;  Location: [facility];  Service: General;  Laterality: N/A;   RADIOACTIVE SEED GUIDED AXILLARY SENTINEL LYMPH NODE Right 11/01/2022   Procedure: RADIOACTIVE SEED GUIDED RIGHT AXILLARY SENTINEL LYMPH NODE DISSECTION;  Surgeon: Oza Blumenthal, MD;  Location: [facility];  Service: General;  Laterality:  Right;   SIMPLE MASTECTOMY WITH AXILLARY SENTINEL NODE BIOPSY Right 11/01/2022   Procedure: RIGHT SIMPLE MASTECTOMY;  Surgeon: Oza Blumenthal, MD;  Location: [facility];  Service: General;  Laterality: Right;   Patient Active Problem List   Diagnosis Date Noted   S/P mastectomy, right 11/01/2022   Genetic testing 06/05/2022   Neutropenia, drug-induced (HCC) 05/28/2022   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) 03/05/2022   Encounter for screening fecal occult blood testing 03/01/2022   Routine cervical smear 03/01/2022   Axillary adenopathy 02/20/2022   Mass of lower outer quadrant of right breast 01/30/2022   Cat bite of index finger 09/12/2021   Gastroesophageal reflux disease without esophagitis 10/20/2019   GAD (generalized anxiety disorder) 10/20/2019   Insomnia due to medical condition 10/20/2019   Hemiparesis affecting right side as late effect of cerebrovascular accident (CVA) (HCC) 04/22/2015   Diabetes mellitus type II, controlled (HCC) 12/23/2014   Hypertension 12/23/2014    PCP: Roise Cleaver, MD  REFERRING PROVIDER: Alwin Baars, NP  REFERRING DIAG:  Diagnosis  C50.811,Z17.1 (ICD-10-CM) - Malignant neoplasm of overlapping sites of right breast in female, estrogen receptor negative (HCC)  C50.811 (ICD-10-CM) - Malignant neoplasm of overlapping sites of right female breast, unspecified estrogen receptor status (HCC)   THERAPY DIAG:  Aftercare following surgery for neoplasm  Difficulty in walking, not elsewhere classified  ONSET DATE: 02/20/22  Rationale for Evaluation and Treatment: Rehabilitation  SUBJECTIVE:                                                                                                                                                                                           SUBJECTIVE STATEMENT:  I was really sore after the last visit. I think it was just all the stretching we did.   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 It measures 3.8 cm and is located in the inner lower quadrant. 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 2 nodes taken at biopsy - 1 was negative the other was positive. Had blood transfusion on 10/05/22. Rt mastectomy and SLNB on 11/01/22 2/6 nodes positive. Currently in radiation and getting Keytruda .  Hx of DVT and stroke in 2015.    PAIN:  Are you having pain? No  PRECAUTIONS: Right lymphedema risk   RED FLAGS: None   WEIGHT BEARING RESTRICTIONS: No  FALLS:  Has patient fallen in last 6 months? No I do have a fear of falling.  I can feel like my legs with crossed up,  LIVING ENVIRONMENT: Lives with: lives with their family - husband and son Lives in: House/apartment Has following equipment at home: Single point cane, Counselling psychologist, Environmental consultant - 2 wheeled, shower chair, and bed side commode  OCCUPATION: on disability since stroke 2015  LEISURE: I get up and down the stairs, trying to walk outside   HAND DOMINANCE: right   PRIOR LEVEL OF FUNCTION: Independent  PATIENT GOALS: Improve my balance - when I get up I sway a little bit      OBJECTIVE: Note: Objective measures were completed at Evaluation unless otherwise noted.  COGNITION: Overall cognitive status: Within functional limits for tasks assessed   SENSATION: WNL with darcofoot testing bil WNL vibration testing  POSTURE: rounded shoulders  09/03/23: Standing: bil genu recurvatum with most weight on the Left leg  MMT: Seated:  knees and ankles 4+/5 Hip Flexion 4+/5 bil Hip abduction 4/5 bil  Flexibility: 09/03/23: Hamstrings: Rt to 65, Lt to 45 Calf in supine: DF to neutral only bil  FUNCTIONAL TESTS:  Reports no trouble with endurance - omitted 6 min walk   BERG: 45/56 See flow sheet for details  37-45 = Significant (>80%) fall risk  Score 44 - 46.5 = patient  should use cane indoor  09/03/23: remains 45/56   DGI: 22: See flow sheet for detials:  Dynamic Gait  Index Scores of 19 or less are predictive of falls in older community living adults    GAIT: Distance walked: in clinic Assistive device utilized: none Level of assistance: Complete Independence Comments: appearance of Rt knee flexion during stance time, Slight wide BOS  L-DEX LYMPHEDEMA SCREENING: Currently getting SOZO - in the green                                                                                                                            TREATMENT DATE:  09/11/23: Therapeutic Exercises  NuStep LE 5: level 5 x 11 mins, 510 steps with increased resistance Neuro Re Ed In // bars for following: Front heel-toe walking x 4, retro heel-toe walking x 3 laps Slow and controlled high knee marching 3 laps [place] to Rt 2 laps, Lt 1 laps On Airex in corner for ball throw at varying heights x 3 mins , 2 LOB but pt able to self correct 6" step ups x 10 each leg with 3" SLS on each rep Wall Push Ups x 10 with pt returning therapist demo  Free Motion Machine for bil scap retract with 3# while also standing on Airex x 15 reps with CGA throughout. VC's for for scap retract at end motion Seated EOB for bil LE stretching: HS stretch, then figure 4 piriformis stretch x 2 reps, 20 sec holds, then in // bars for holding lunge position for gastroc stretch x 3 each, 20 sec holds  09/03/23: Therapeutic Exercises NuStep UE 9/LE level 4 x 11 mins and 670 steps - noticing that pts left foot does not want to stay down on the plate and pt discussing how she has trouble getting her shoes on because her foot doesn't want to go flat.   In supine checked hamstring and calf flexibility see above Negative clonus Performed hamstring stretch with strap 2x20" bil with PT minA holding heel - and trying to add some DF with the pull.  Seated hamstring stretch x 20" each Standing wall calf stretch  2x20" bil with education  Added both to HEP Assessed standing posture without shoes: pt stands in genu recurvatum with most weight on the left leg DL squat: loses balance posteriorly but able to correct Neuro Re Ed In parallel bars: redid Berg balance scale and checked goals Then heel walking x 2 lengths and 2 lengths of toe walking with HHA PRN Then on trampoline weight shift Rt / Lt without hands with pt having trouble shifting weight vs just leaning trunk Weight shifts in stance position forward and backward but this was hard so we moved to the floor to do this.   08/27/23: Therapeutic Exercises NuStep Level 5 UE 9/LE level 4 x 3:30 mins for a total of 10:30 mins and 694 steps HS and piriformis stretch x 2 reps, 20-30 sec each leg, then calf stretch in // bars with foot on incline and in a lunge, x 2 reps, 20 sec each leg Neuro  Re Ed In // bars: Heel - toe front x 3 laps and retro walking x 2 laps, then grapevine Rt and Lt x 2 laps each way, then slow and controlled high knee marching x 2 laps; then toe walking x 7 laps (pt reports feeling stronger with this today than last session), lunge walking x 3 laps returning therapist demo 6" step up x 10 reps with 3" SLS  Pt stood in corner on purple balance pad x 2 mins for ball toss at varying heights Therapeutic Activities In // bars for bil hip 4 way raises against yellow theraband resistance x 10 each with returning therapist demo for each and VC's throughout for correct direction and position of LE. +2 UE support throughout required [employer] 7# for rows 2 x 10, VC's for core engagement throughout and for scapular retraction at end of motion    PATIENT EDUCATION:  Education details: Standing bil LE strength and balance Person educated: Patient Education method: Explanation, demonstration, and handouts issued Education comprehension: verbalized understanding, returned demonstration, VC's and will benefit from further  review  HOME EXERCISE PROGRAM: Walking, posture 07/31/23 - Standing bil LE strength and balance activities Access Code: [email or web address] URL: [email or web address] Date: 09/03/2023 Prepared by: Judy Null  Exercises - Seated Hamstring Stretch  - 1-3 x daily - 7 x weekly - 1 sets - 3 reps - 20-30 seconds hold - Gastroc Stretch on Wall  - 1-3 x daily - 7 x weekly - 1 sets - 3 reps - 20-30 seconds hold  ASSESSMENT:  CLINICAL IMPRESSION: Pt reports after stretching last week she felt like she was a bit unstable and had to rest for awhile after session as she didn't feel comfortable walking. Explained to pt that her body has been so tight for so long that some times after stretching it is possible to feel a bit unstable for a short time from being looser and her muscles haven't felt that way for a long time. Encouraged pt to cont to incorporate stretching into her day so her muscles can adjust to new length and less tightness. Pt verbalized understanding.   OBJECTIVE IMPAIRMENTS: Abnormal gait, decreased balance, and difficulty walking.   ACTIVITY LIMITATIONS: locomotion level  PARTICIPATION LIMITATIONS: none  PERSONAL FACTORS: Age, Fitness, Time since onset of injury/illness/exacerbation, and 1-2 comorbidities: chemo hx  are also affecting patient's functional outcome.   REHAB POTENTIAL: Excellent  CLINICAL DECISION MAKING: Stable/uncomplicated  EVALUATION COMPLEXITY: Low  GOALS: Goals reviewed with patient? Yes  SHORT TERM GOALS=LTGs: Target date: 10/15/23  Pt will  improve BERG balance score to 50/56 to decrease risk of falling  Baseline:45 Goal status: ONGOING  2.  Pt will improve single leg stance to at least 6" on bil legs to decrease risk of falling and improve gait  Baseline: 1-2" bil; 07/31/23 - 2-3" bil, 09/03/23 1-2" Goal status: ONGOING  3.  Pt will be ind with final HEP for continued strength and mobility  Baseline:  Goal status: ONGOING   PLAN:  PT  FREQUENCY: 2x/week  PT DURATION: 6 weeks - after radiation ends  PLANNED INTERVENTIONS: 97164- PT Re-evaluation, 97110-Therapeutic exercises, 97530- Therapeutic activity, 97112- Neuromuscular re-education, 97535- Self Care, [id number]- Manual therapy, [phone]- Gait training, Patient/Family education, Balance training, Joint mobilization, Therapeutic exercises, Therapeutic activity, Neuromuscular re-education, Gait training, and Self Care  PLAN FOR NEXT SESSION: Cont balance work and general TE. Work on [employer] shifting and pregait activities. Stretching calf and hamstrings and encouraging pt in same.    Martell Skinner,  [facility], PTA 09/11/2023, 11:11 AM

## 2023-09-12 DIAGNOSIS — C50911 Malignant neoplasm of unspecified site of right female breast: Secondary | ICD-10-CM | POA: Diagnosis not present

## 2023-09-16 ENCOUNTER — Ambulatory Visit: Payer: No Typology Code available for payment source

## 2023-09-17 ENCOUNTER — Ambulatory Visit

## 2023-09-17 VITALS — Wt 170.5 lb

## 2023-09-17 DIAGNOSIS — Z483 Aftercare following surgery for neoplasm: Secondary | ICD-10-CM | POA: Diagnosis not present

## 2023-09-17 DIAGNOSIS — R262 Difficulty in walking, not elsewhere classified: Secondary | ICD-10-CM
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
## 2023-09-18 ENCOUNTER — Ambulatory Visit: Payer: No Typology Code available for payment source | Admitting: Family Medicine

## 2023-09-18 ENCOUNTER — Encounter: Payer: Self-pay | Admitting: Family Medicine

## 2023-09-18 DIAGNOSIS — E782 Mixed hyperlipidemia: Secondary | ICD-10-CM | POA: Diagnosis not present

## 2023-09-18 DIAGNOSIS — G4701 Insomnia due to medical condition: Secondary | ICD-10-CM

## 2023-09-18 DIAGNOSIS — E118 Type 2 diabetes mellitus with unspecified complications: Secondary | ICD-10-CM | POA: Diagnosis not present

## 2023-09-18 DIAGNOSIS — K219 Gastro-esophageal reflux disease without esophagitis: Secondary | ICD-10-CM

## 2023-09-18 DIAGNOSIS — I10 Essential (primary) hypertension: Secondary | ICD-10-CM

## 2023-09-18 LAB — LIPID PANEL

## 2023-09-18 LAB — BAYER DCA HB A1C WAIVED: HB A1C (BAYER DCA - WAIVED): 5.6 % (ref 4.8–5.6)

## 2023-09-18 MED ORDER — ATORVASTATIN CALCIUM 80 MG PO TABS: ORAL_TABLET | ORAL | 3 refills | Status: AC

## 2023-09-18 MED ORDER — AMLODIPINE BESYLATE 10 MG PO TABS: 10.0000 mg | ORAL_TABLET | Freq: Every day | ORAL | 3 refills | Status: AC

## 2023-09-18 MED ORDER — TRAZODONE HCL 150 MG PO TABS
ORAL_TABLET | ORAL | 0 refills | Status: DC
Start: 2023-09-18 — End: 2023-12-19

## 2023-09-18 MED ORDER — METOPROLOL SUCCINATE ER 200 MG PO TB24: ORAL_TABLET | ORAL | 3 refills | Status: AC

## 2023-09-18 MED ORDER — PANTOPRAZOLE SODIUM 40 MG PO TBEC: DELAYED_RELEASE_TABLET | ORAL | 3 refills | Status: AC

## 2023-09-18 NOTE — Progress Notes (Signed)
 Subjective:  Patient ID: Marilyn Ford,  female    DOB: [birth year]  Age: 63 y.o.    CC: Medical Management of Chronic Issues (3 month follow up )   HPI Marilyn Ford presents for  follow-up of hypertension. Patient has no history of headache chest pain or shortness of breath or recent cough. Patient also denies symptoms of TIA such as numbness weakness lateralizing. Patient denies side effects from medication. States taking it regularly.  Patient also  in for follow-up of elevated cholesterol. Doing well without complaints on current medication. Denies side effects  including myalgia and arthralgia and nausea. Also in today for liver function testing. Currently no chest pain, shortness of breath or other cardiovascular related symptoms noted.  Follow-up of diabetes. Patient does check blood sugar at home. Readings run 140 postprandial Patient denies symptoms such as excessive hunger or urinary frequency, excessive hunger, nausea No significant hypoglycemic spells noted. Medications reviewed. Pt reports taking them regularly. Pt. denies complication/adverse reaction today.    History Marilyn Ford has a past medical history of Allergy, Anxiety, Arthritis, Blood transfusion without reported diagnosis, Breast cancer (HCC) (10/2022), Diabetes mellitus without complication (HCC), GERD (gastroesophageal reflux disease), Heart murmur, History of kidney stones, Hypertension, Pneumonia, Stroke (HCC), and Vaginal Pap smear, abnormal.   She has a past surgical history that includes Cholecystectomy; Cesarean section; Colonoscopy with propofol  (N/A, 07/19/2020); polypectomy (07/19/2020); Portacath placement (N/A, 03/20/2022); Breast biopsy (10/31/2022); Simple mastectomy with axillary sentinel node biopsy (Right, 11/01/2022); Radioactive seed guided axillary sentinel lymph node (Right, 11/01/2022); and Mastectomy (Right, 2024).   Her family history includes Breast cancer in her maternal aunt; Cancer in  her maternal grandmother; Diabetes in her brother and mother; Uterine cancer (age of onset: 73 - 93) in her mother.She reports that she quit smoking about 8 years ago. Her smoking use included cigarettes. She started smoking about 23 years ago. She has a 12.5 pack-year smoking history. She has never used smokeless tobacco. She reports current alcohol use of about 2.0 - 3.0 standard drinks of alcohol per week. She reports that she does not use drugs.  Current Outpatient Medications on File Prior to Visit  Medication Sig Dispense Refill   Alcohol Swabs (B-D SINGLE USE SWABS REGULAR) PADS Test BS daily and as needed Dx E11.9 100 each 3   aluminum -magnesium  hydroxide-simethicone  (MAALOX) 200-200-20 MG/5ML SUSP Take 30 mLs by mouth 4 (four) times daily -  before meals and at bedtime. 480 mL 2   Apple Cider Vinegar 500 MG TABS Take 500 mg by mouth in the morning.     aspirin  81 MG EC tablet Take 1 tablet (81 mg total) by mouth daily. 30 tablet 0   betamethasone  dipropionate 0.05 % cream Apply topically 2 (two) times daily. 30 g 0   Blood Glucose Calibration (TRUE METRIX LEVEL 1) Low SOLN Use with glucometer Dx E11.9 3 each 0   Blood Glucose Monitoring Suppl (TRUE METRIX AIR GLUCOSE METER) w/Device KIT Test BS daily and as needed Dx E11.9 1 kit 0   Calcium  Carb-Cholecalciferol (CALCIUM -VITAMIN D) 600-400 MG-UNIT TABS Take 1 tablet by mouth in the morning.     Coenzyme Q10 (COQ10) 100 MG CAPS Take 100 mg by mouth in the morning.     Cranberry 425 MG CAPS Take 425 mg by mouth in the morning and at bedtime.     diphenoxylate -atropine  (LOMOTIL ) 2.5-0.025 MG tablet Take 1 tablet by mouth 4 (four) times daily as needed for diarrhea or loose stools. 30  tablet 0   Flaxseed, Linseed, (FLAXSEED OIL) 1000 MG CAPS Take 1,000 mg by mouth in the morning.     glucose blood (TRUE METRIX BLOOD GLUCOSE TEST) test strip Test BS daily and as needed Dx E11.9 100 each 3   icosapent  Ethyl (VASCEPA ) 1 g capsule Take 2 g by mouth  2 (two) times daily.     Krill Oil 500 MG CAPS Take 3 capsules (1,500 mg total) by mouth in the morning and at bedtime.     lidocaine  (XYLOCAINE ) 2 % solution Use as directed 15 mLs in the mouth or throat every 6 (six) hours as needed for mouth pain. 250 mL 1   lidocaine -prilocaine  (EMLA ) cream Apply 1 Application topically as needed. 30 g 0   magnesium  oxide (MAG-OX) 400 (240 Mg) MG tablet TAKE 1 TABLET BY MOUTH TWICE A DAY 60 tablet 1   meloxicam  (MOBIC ) 15 MG tablet TAKE 1 TABLET (15 MG TOTAL) BY MOUTH DAILY FOR JOINT AND MUSCLE PAIN 90 tablet 0   Multiple Vitamin (MULTIVITAMIN) capsule Take 1 capsule by mouth in the morning.     ondansetron  (ZOFRAN ) 8 MG tablet Take 1 tablet (8 mg total) by mouth every 8 (eight) hours as needed for nausea or vomiting. 30 tablet 3   TRUEplus Lancets 33G MISC Test BS daily and as needed Dx E11.9 100 each 3   valsartan  (DIOVAN ) 320 MG tablet Take 1 tablet (320 mg total) by mouth daily. For blood pressure. 90 tablet 3   zinc  gluconate 50 MG tablet Take 50 mg by mouth daily.     Zinc  Oxide 20 % PSTE Apply 1 Application topically 3 (three) times daily. 57 g 1   No current facility-administered medications on file prior to visit.    ROS Review of Systems  Constitutional: Negative.   HENT: Negative.    Eyes:  Negative for visual disturbance.  Respiratory:  Negative for shortness of breath.   Cardiovascular:  Negative for chest pain.  Gastrointestinal:  Negative for abdominal pain.  Musculoskeletal:  Negative for arthralgias.    Objective:  BP 133/85   Pulse 67   Temp (!) 97 F (36.1 C)   Ht 5\' 2"  (1.575 m)   Wt 169 lb 6.4 oz (76.8 kg)   SpO2 94%   BMI 30.98 kg/m   BP Readings from Last 3 Encounters:  09/18/23 133/85  08/15/23 (!) 140/68  08/05/23 (!) 148/85    Wt Readings from Last 3 Encounters:  09/18/23 169 lb 6.4 oz (76.8 kg)  09/17/23 170 lb 8 oz (77.3 kg)  09/09/23 164 lb (74.4 kg)    Lab Results  Component Value Date   HGBA1C 4.5  (L) 06/20/2023   HGBA1C 5.0 03/20/2023   HGBA1C 4.2 (L) 12/17/2022    Physical Exam Constitutional:      General: She is not in acute distress.    Appearance: She is well-developed.  Cardiovascular:     Rate and Rhythm: Normal rate and regular rhythm.  Pulmonary:     Breath sounds: Normal breath sounds.  Musculoskeletal:        General: Normal range of motion.  Skin:    General: Skin is warm and dry.  Neurological:     Mental Status: She is alert and oriented to person, place, and time.         Assessment & Plan:  Gastroesophageal reflux disease without esophagitis -     Pantoprazole  Sodium; TAKE 1 TABLET twice daily FOR STOMACH  Dispense:  180 tablet; Refill: 3 -     CBC with Differential/Platelet -     CMP14+EGFR  Essential hypertension -     Metoprolol  Succinate ER; TAKE 1 TABLET ONE TIME DAILY, WITH OR IMMEDIATELY FOLLOWING A MEAL  Dispense: 90 tablet; Refill: 3 -     amLODIPine  Besylate; Take 1 tablet (10 mg total) by mouth daily.  Dispense: 90 tablet; Refill: 3 -     CBC with Differential/Platelet -     CMP14+EGFR  Mixed hyperlipidemia -     Atorvastatin  Calcium ; TAKE 1 TABLET EVERY DAY AT 6PM  Dispense: 90 tablet; Refill: 3 -     CBC with Differential/Platelet -     CMP14+EGFR -     Lipid panel  Controlled type 2 diabetes mellitus with complication, without long-term current use of insulin  (HCC) -     Atorvastatin  Calcium ; TAKE 1 TABLET EVERY DAY AT 6PM  Dispense: 90 tablet; Refill: 3 -     Bayer DCA Hb A1c Waived -     CBC with Differential/Platelet -     CMP14+EGFR  Insomnia due to medical condition -     traZODone  HCl; TAKE 1 OR 2 TABLETS AT BEDTIME FOR SLEEP  Dispense: 180 tablet; Refill: 0 -     CBC with Differential/Platelet -     CMP14+EGFR    Follow-up: Return in about 3 months (around 12/19/2023).  Marilyn Ford, M.D.

## 2023-09-19 ENCOUNTER — Encounter: Payer: Self-pay | Admitting: Hematology

## 2023-09-19 LAB — CBC WITH DIFFERENTIAL/PLATELET
Basophils Absolute: 0 10*3/uL (ref 0.0–0.2)
Basos: 1 %
EOS (ABSOLUTE): 0.1 10*3/uL (ref 0.0–0.4)
Eos: 2 %
Hematocrit: 36.5 % (ref 34.0–46.6)
Hemoglobin: 12 g/dL (ref 11.1–15.9)
Immature Grans (Abs): 0 10*3/uL (ref 0.0–0.1)
Immature Granulocytes: 0 %
Lymphocytes Absolute: 0.5 10*3/uL — ABNORMAL LOW (ref 0.7–3.1)
Lymphs: 13 %
MCH: 34.8 pg — ABNORMAL HIGH (ref 26.6–33.0)
MCHC: 32.9 g/dL (ref 31.5–35.7)
MCV: 106 fL — ABNORMAL HIGH (ref 79–97)
Monocytes Absolute: 0.3 10*3/uL (ref 0.1–0.9)
Monocytes: 7 %
Neutrophils Absolute: 3.1 10*3/uL (ref 1.4–7.0)
Neutrophils: 77 %
Platelets: 137 10*3/uL — ABNORMAL LOW (ref 150–450)
RBC: 3.45 x10E6/uL — ABNORMAL LOW (ref 3.77–5.28)
RDW: 14.2 % (ref 11.7–15.4)
WBC: 4 10*3/uL (ref 3.4–10.8)

## 2023-09-19 LAB — CMP14+EGFR
ALT: 25 IU/L (ref 0–32)
AST: 26 IU/L (ref 0–40)
Albumin: 4.3 g/dL (ref 3.9–4.9)
Alkaline Phosphatase: 152 IU/L — ABNORMAL HIGH (ref 44–121)
BUN/Creatinine Ratio: 18 (ref 12–28)
BUN: 18 mg/dL (ref 8–27)
Bilirubin Total: 0.7 mg/dL (ref 0.0–1.2)
CO2: 21 mmol/L (ref 20–29)
Calcium: 9.6 mg/dL (ref 8.7–10.3)
Chloride: 101 mmol/L (ref 96–106)
Creatinine, Ser: 1.01 mg/dL — ABNORMAL HIGH (ref 0.57–1.00)
Globulin, Total: 2.6 g/dL (ref 1.5–4.5)
Glucose: 163 mg/dL — ABNORMAL HIGH (ref 70–99)
Potassium: 4.5 mmol/L (ref 3.5–5.2)
Sodium: 140 mmol/L (ref 134–144)
Total Protein: 6.9 g/dL (ref 6.0–8.5)
eGFR: 63 mL/min/{1.73_m2} (ref >59)

## 2023-09-19 LAB — LIPID PANEL
Cholesterol, Total: 174 mg/dL (ref 100–199)
HDL: 45 mg/dL (ref >39)
LDL CALC COMMENT:: 3.9 ratio (ref 0.0–4.4)
LDL Chol Calc (NIH): 84 mg/dL (ref 0–99)
Triglycerides: 275 mg/dL — ABNORMAL HIGH (ref 0–149)
VLDL Cholesterol Cal: 45 mg/dL — ABNORMAL HIGH (ref 5–40)

## 2023-09-22 ENCOUNTER — Ambulatory Visit: Payer: Self-pay | Admitting: Family Medicine

## 2023-09-22 NOTE — Progress Notes (Signed)
Hello Jini,  Your lab result is normal and/or stable.Some minor variations that are not significant are commonly marked abnormal, but do not represent any medical problem for you.  Best regards, Mechele Claude, M.D.

## 2023-09-24 ENCOUNTER — Ambulatory Visit

## 2023-09-24 DIAGNOSIS — Z483 Aftercare following surgery for neoplasm: Secondary | ICD-10-CM | POA: Diagnosis not present

## 2023-09-24 DIAGNOSIS — R262 Difficulty in walking, not elsewhere classified: Secondary | ICD-10-CM

## 2023-09-24 NOTE — Therapy (Signed)
 OUTPATIENT PHYSICAL THERAPY   ONCOLOGY TREATMENT  Patient Name: Marilyn Ford MRN: [id number] DOB:06-26-1960, 63 y.o., female Today's Date: 09/24/2023  END OF SESSION:  PT End of Session - 09/24/23 1010     Visit Number 10    Number of Visits 19    Date for PT Re-Evaluation 10/15/23    Authorization Type none needed    PT Start Time 1003    PT Stop Time 1059    PT Time Calculation (min) 56 min    Activity Tolerance Patient tolerated treatment well    Behavior During Therapy [facility] for tasks assessed/performed               Past Medical History:  Diagnosis Date   Allergy    Anxiety    Arthritis    Blood transfusion without reported diagnosis    Breast cancer (HCC) 10/2022   right breast   Diabetes mellitus without complication (HCC)    GERD (gastroesophageal reflux disease)    Heart murmur    as a child   History of kidney stones    Hypertension    Pneumonia    Stroke [facility])    Vaginal Pap smear, abnormal    Past Surgical History:  Procedure Laterality Date   BREAST BIOPSY  10/31/2022   US  RT RADIOACTIVE SEED LOC 10/31/2022 [facility]   CESAREAN SECTION     CHOLECYSTECTOMY     COLONOSCOPY WITH PROPOFOL  N/A 07/19/2020   Procedure: COLONOSCOPY WITH PROPOFOL ;  Surgeon: Urban Garden, MD;  Location: [facility];  Service: Gastroenterology;  Laterality: N/A;  AM   MASTECTOMY Right 2024   POLYPECTOMY  07/19/2020   Procedure: POLYPECTOMY;  Surgeon: Urban Garden, MD;  Location: [facility];  Service: Gastroenterology;;   PORTACATH PLACEMENT N/A 03/20/2022   Procedure: INSERTION PORT-A-CATH;  Surgeon: Oza Blumenthal, MD;  Location: [facility];  Service: General;  Laterality: N/A;   RADIOACTIVE SEED GUIDED AXILLARY SENTINEL LYMPH NODE Right 11/01/2022   Procedure: RADIOACTIVE SEED GUIDED RIGHT AXILLARY SENTINEL LYMPH NODE DISSECTION;  Surgeon: Oza Blumenthal, MD;  Location: [facility];  Service: General;  Laterality:  Right;   SIMPLE MASTECTOMY WITH AXILLARY SENTINEL NODE BIOPSY Right 11/01/2022   Procedure: RIGHT SIMPLE MASTECTOMY;  Surgeon: Oza Blumenthal, MD;  Location: [facility];  Service: General;  Laterality: Right;   Patient Active Problem List   Diagnosis Date Noted   S/P mastectomy, right 11/01/2022   Genetic testing 06/05/2022   Neutropenia, drug-induced (HCC) 05/28/2022   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) 03/05/2022   Encounter for screening fecal occult blood testing 03/01/2022   Routine cervical smear 03/01/2022   Axillary adenopathy 02/20/2022   Mass of lower outer quadrant of right breast 01/30/2022   Cat bite of index finger 09/12/2021   Gastroesophageal reflux disease without esophagitis 10/20/2019   GAD (generalized anxiety disorder) 10/20/2019   Insomnia due to medical condition 10/20/2019   Hemiparesis affecting right side as late effect of cerebrovascular accident (CVA) (HCC) 04/22/2015   Diabetes mellitus type II, controlled (HCC) 12/23/2014   Hypertension 12/23/2014    PCP: Roise Cleaver, MD  REFERRING PROVIDER: Alwin Baars, NP  REFERRING DIAG:  Diagnosis  C50.811,Z17.1 (ICD-10-CM) - Malignant neoplasm of overlapping sites of right breast in female, estrogen receptor negative (HCC)  C50.811 (ICD-10-CM) - Malignant neoplasm of overlapping sites of right female breast, unspecified estrogen receptor status (HCC)   THERAPY DIAG:  Aftercare following surgery for neoplasm  Difficulty in walking, not elsewhere classified  ONSET DATE: 02/20/22  Rationale for Evaluation and Treatment: Rehabilitation  SUBJECTIVE:                                                                                                                                                                                           SUBJECTIVE STATEMENT:  I just finished radiation 2-3 weeks ago and my Rt armpit is just starting to feel swollen. The burn  on that arm is healing well though. I fell in my room the other day and I couldn't get off the floor but it wasn't really my strength that was the problem. My bed is on wheels though and I couldn't lock it so I couldn't pull myself up with it. My husband ended up helping me up. I want to try the leg press again to ge my legs stronger. Let's just start on a lighter weight this time.   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 It measures 3.8 cm and is located in the inner lower quadrant. 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 2 nodes taken at biopsy - 1 was negative the other was positive. Had blood transfusion on 10/05/22. Rt mastectomy and SLNB on 11/01/22 2/6 nodes positive. Currently in radiation and getting Keytruda .  Hx of DVT and stroke in 2015.    PAIN:  Are you having pain? No  PRECAUTIONS: Right lymphedema risk   RED FLAGS: None   WEIGHT BEARING RESTRICTIONS: No  FALLS:  Has patient fallen in last 6 months? No I do have a fear of falling.  I can feel like my legs with crossed up,  LIVING ENVIRONMENT: Lives with: lives with their family - husband and son Lives in: House/apartment Has following equipment at home: Single point cane, Counselling psychologist, Environmental consultant - 2 wheeled, shower chair, and bed side commode  OCCUPATION: on disability since stroke 2015  LEISURE: I get up and down the stairs, trying to walk outside   HAND DOMINANCE: right   PRIOR LEVEL OF FUNCTION: Independent  PATIENT GOALS: Improve my balance - when I get up I sway a little bit      OBJECTIVE: Note: Objective measures were completed at Evaluation unless otherwise noted.  COGNITION: Overall cognitive status: Within functional limits for tasks assessed   SENSATION: WNL with darcofoot testing bil WNL vibration testing  POSTURE: rounded shoulders  09/03/23: Standing: bil genu recurvatum with  most weight on the Left leg  MMT: Seated:  knees and ankles 4+/5 Hip Flexion  4+/5 bil Hip abduction 4/5 bil  Flexibility: 09/03/23: Hamstrings: Rt to 65, Lt to 45 Calf in supine: DF to neutral only bil  FUNCTIONAL TESTS:  Reports no trouble with endurance - omitted 6 min walk   BERG: 45/56 See flow sheet for details  37-45 = Significant (>80%) fall risk  Score 44 - 46.5 = patient should use cane indoor  09/03/23: remains 45/56   DGI: 22: See flow sheet for detials:  Dynamic Gait Index Scores of 19 or less are predictive of falls in older community living adults    GAIT: Distance walked: in clinic Assistive device utilized: none Level of assistance: Complete Independence Comments: appearance of Rt knee flexion during stance time, Slight wide BOS  L-DEX LYMPHEDEMA SCREENING: Currently getting SOZO - in the red today, 09/17/23, see assessment                                                                                                                              TREATMENT DATE:  09/24/23: Therapeutic Exercises  NuStep LE 5: level 5 x 12 mins, 580 steps with slower pace today Bil leg press 30# 2 x 10 with 1 min rest between sets Hip Machine 25# flex x 10 each leg returning therapist demo Seated edge of chair for HS stretch x 2 reps, 20 sec each, then piriformis figure 4 stretch x 2 reps, 20 sec each Then in // bars for gastroc stretch in runners lunge x 2 reps, x 20 sec each Pulleys briefly to show pt proper technique about decreasing Rt scapular compensation into flex and abd so pt can use these properly at home Neuro Re Ed In // bars: Front and retro heel-toe walking on airex beam with VC's for less UE support, x 4 reps each Standing in corner on Airex with purple ball toss at varying heights x 2 mins. During this time spoke with pt about using her pulleys at home to help her decrease Rt axillary tightness. Also showed  On mini tramp for Weight shifting 3 ways x 1 min  each with except with Rt forward +2 HHA, then alt marching x 1:30 mins with VC's to decrease Rt side trunk lean, pt with improved technique to Lt side today Therapeutic Activities Free Motion Machine with pt standing on airex for bil scap retract with 3# x 10 reps, then bil shoulder ext 3# x 10 reps with pt returning demo and VC's to engage core.   09/17/23: Therapeutic Exercises  NuStep LE 5: level 5 x 12 mins, 778 steps with increased resistance Therapeutic Activities On mini tramp for following: Weight shifting 3 ways x 1 min each with +1 HHA except with Rt forward +2 HHA, then alt marching x 1:30 mins with VC's to decrease bil side trunk lean In // bars: Hip 3 way raises with contralateral SLS on blue oval 2 x 12 each with 2# on each ankle with VC's and demo during for correct  LE position and to limit trunk compensations; between sets HS stretch and figure 4 piriformis stretch 1x each, 15-20 sec holds Free Motion Machine for bil scap retract with 3# x 10 reps, then bil shoulder ext 3# x 10 reps with pt returning demo On Airex in corner for ball throw at varying heights x 2:30 mins 6" step up front with +1 HHA with 3" SLS on step with each rep x 12 each On Airex beam at side of TM for following with +1 HHA: Front and retro heel-toe walking, then crossover in front to Rt and Lt x 2 laps each  Gastroc stretch in runner's lunge x 3 reps, 20-30 sec, seated EOB for bil HS stretch and then figure 4 piriformis stretch x 2 reps, 20 sec holds The SOZO done at end of session for 3 month check up.  09/11/23: Therapeutic Exercises  NuStep LE 5: level 5 x 11 mins, 510 steps with increased resistance Neuro Re Ed In // bars for following: Front heel-toe walking x 4, retro heel-toe walking x 3 laps Slow and controlled high knee marching 3 laps [place] to Rt 2 laps, Lt 1 laps On Airex in corner for ball throw at varying heights x 3 mins , 2 LOB but pt able to self correct 6" step ups x 10 each leg with 3"  SLS on each rep Wall Push Ups x 10 with pt returning therapist demo  Free Motion Machine for bil scap retract with 3# while also standing on Airex x 15 reps with CGA throughout. VC's for for scap retract at end motion Seated EOB for bil LE stretching: HS stretch, then figure 4 piriformis stretch x 2 reps, 20 sec holds, then in // bars for holding lunge position for gastroc stretch x 3 each, 20 sec holds  09/03/23: Therapeutic Exercises NuStep UE 9/LE level 4 x 11 mins and 670 steps - noticing that pts left foot does not want to stay down on the plate and pt discussing how she has trouble getting her shoes on because her foot doesn't want to go flat.   In supine checked hamstring and calf flexibility see above Negative clonus Performed hamstring stretch with strap 2x20" bil with PT minA holding heel - and trying to add some DF with the pull.  Seated hamstring stretch x 20" each Standing wall calf stretch 2x20" bil with education  Added both to HEP Assessed standing posture without shoes: pt stands in genu recurvatum with most weight on the left leg DL squat: loses balance posteriorly but able to correct Neuro Re Ed In parallel bars: redid Berg balance scale and checked goals Then heel walking x 2 lengths and 2 lengths of toe walking with HHA PRN Then on trampoline weight shift Rt / Lt without hands with pt having trouble shifting weight vs just leaning trunk Weight shifts in stance position forward and backward but this was hard so we moved to the floor to do this.       PATIENT EDUCATION:  Education details: Standing bil LE strength and balance Person educated: Patient Education method: Explanation, demonstration, and handouts issued Education comprehension: verbalized understanding, returned demonstration, VC's and will benefit from further review  HOME EXERCISE PROGRAM: Walking, posture 07/31/23 - Standing bil LE strength and balance activities Access Code: [email or web address] URL:  [email or web address] Date: 09/03/2023 Prepared by: Judy Null  Exercises - Seated Hamstring Stretch  - 1-3 x daily - 7 x weekly - 1 sets - 3 reps - 20-30 seconds  hold - Gastroc Stretch on Wall  - 1-3 x daily - 7 x weekly - 1 sets - 3 reps - 20-30 seconds hold  ASSESSMENT:  CLINICAL IMPRESSION: Pt reports she fell over the weekend by just sliding off the EOB as her bed is on wheels and she has trouble locking them, Then she had trouble getting up due to the bed rolling when she tried to pull herself up. Pt was agreeable to resuming leg press and also added hip machine today to help improve bil LE strength and we discussed other ways to get up off floor if she falls again while she was on NuStep. Then continued with balance activities. Also showed pt proper technique with pulleys as she reports increased Rt axillary tightness since completing radiation a few weeks ago and has pulleys at home.   OBJECTIVE IMPAIRMENTS: Abnormal gait, decreased balance, and difficulty walking.   ACTIVITY LIMITATIONS: locomotion level  PARTICIPATION LIMITATIONS: none  PERSONAL FACTORS: Age, Fitness, Time since onset of injury/illness/exacerbation, and 1-2 comorbidities: chemo hx are also affecting patient's functional outcome.   REHAB POTENTIAL: Excellent  CLINICAL DECISION MAKING: Stable/uncomplicated  EVALUATION COMPLEXITY: Low  GOALS: Goals reviewed with patient? Yes  SHORT TERM GOALS=LTGs: Target date: 10/15/23  Pt will  improve BERG balance score to 50/56 to decrease risk of falling  Baseline:45 Goal status: ONGOING  2.  Pt will improve single leg stance to at least 6" on bil legs to decrease risk of falling and improve gait  Baseline: 1-2" bil; 07/31/23 - 2-3" bil, 09/03/23 1-2" Goal status: ONGOING  3.  Pt will be ind with final HEP for continued strength and mobility  Baseline:  Goal status: ONGOING   PLAN:  PT FREQUENCY: 2x/week  PT DURATION: 6 weeks - after radiation  ends  PLANNED INTERVENTIONS: 97164- PT Re-evaluation, 97110-Therapeutic exercises, 97530- Therapeutic activity, 97112- Neuromuscular re-education, 97535- Self Care, 10960- Manual therapy, [phone]- Gait training, Patient/Family education, Balance training, Joint mobilization, Therapeutic exercises, Therapeutic activity, Neuromuscular re-education, Gait training, and Self Care  PLAN FOR NEXT SESSION: Cont balance work and general TE and cont to progress pt as she tolerates. Cont weight shifting on mini tramp and pregait activities. Stretching calf and hamstrings and encouraging pt in same.    Denyce Flank, PTA 09/24/2023, 11:03 AM

## 2023-10-01 ENCOUNTER — Ambulatory Visit

## 2023-10-01 DIAGNOSIS — Z483 Aftercare following surgery for neoplasm: Secondary | ICD-10-CM

## 2023-10-01 DIAGNOSIS — R262 Difficulty in walking, not elsewhere classified: Secondary | ICD-10-CM

## 2023-10-01 NOTE — Therapy (Signed)
 OUTPATIENT PHYSICAL THERAPY   ONCOLOGY TREATMENT  Patient Name: Marilyn Ford MRN: [id number] DOB:1960/07/07, 63 y.o., female Today's Date: 10/01/2023  END OF SESSION:  PT End of Session - 10/01/23 1209     Visit Number 11    Number of Visits 19    Date for PT Re-Evaluation 10/15/23    Authorization Type none needed    PT Start Time 1205    PT Stop Time 1254    PT Time Calculation (min) 49 min    Activity Tolerance Patient tolerated treatment well    Behavior During Therapy [facility] for tasks assessed/performed               Past Medical History:  Diagnosis Date   Allergy    Anxiety    Arthritis    Blood transfusion without reported diagnosis    Breast cancer (HCC) 10/2022   right breast   Diabetes mellitus without complication (HCC)    GERD (gastroesophageal reflux disease)    Heart murmur    as a child   History of kidney stones    Hypertension    Pneumonia    Stroke [facility])    Vaginal Pap smear, abnormal    Past Surgical History:  Procedure Laterality Date   BREAST BIOPSY  10/31/2022   US  RT RADIOACTIVE SEED LOC 10/31/2022 [facility]   CESAREAN SECTION     CHOLECYSTECTOMY     COLONOSCOPY WITH PROPOFOL  N/A 07/19/2020   Procedure: COLONOSCOPY WITH PROPOFOL ;  Surgeon: Urban Garden, MD;  Location: [facility];  Service: Gastroenterology;  Laterality: N/A;  AM   MASTECTOMY Right 2024   POLYPECTOMY  07/19/2020   Procedure: POLYPECTOMY;  Surgeon: Urban Garden, MD;  Location: [facility];  Service: Gastroenterology;;   PORTACATH PLACEMENT N/A 03/20/2022   Procedure: INSERTION PORT-A-CATH;  Surgeon: Oza Blumenthal, MD;  Location: [facility];  Service: General;  Laterality: N/A;   RADIOACTIVE SEED GUIDED AXILLARY SENTINEL LYMPH NODE Right 11/01/2022   Procedure: RADIOACTIVE SEED GUIDED RIGHT AXILLARY SENTINEL LYMPH NODE DISSECTION;  Surgeon: Oza Blumenthal, MD;  Location: [facility];  Service: General;  Laterality:  Right;   SIMPLE MASTECTOMY WITH AXILLARY SENTINEL NODE BIOPSY Right 11/01/2022   Procedure: RIGHT SIMPLE MASTECTOMY;  Surgeon: Oza Blumenthal, MD;  Location: [facility];  Service: General;  Laterality: Right;   Patient Active Problem List   Diagnosis Date Noted   S/P mastectomy, right 11/01/2022   Genetic testing 06/05/2022   Neutropenia, drug-induced (HCC) 05/28/2022   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) 03/05/2022   Encounter for screening fecal occult blood testing 03/01/2022   Routine cervical smear 03/01/2022   Axillary adenopathy 02/20/2022   Mass of lower outer quadrant of right breast 01/30/2022   Cat bite of index finger 09/12/2021   Gastroesophageal reflux disease without esophagitis 10/20/2019   GAD (generalized anxiety disorder) 10/20/2019   Insomnia due to medical condition 10/20/2019   Hemiparesis affecting right side as late effect of cerebrovascular accident (CVA) (HCC) 04/22/2015   Diabetes mellitus type II, controlled (HCC) 12/23/2014   Hypertension 12/23/2014    PCP: Roise Cleaver, MD  REFERRING PROVIDER: Alwin Baars, NP  REFERRING DIAG:  Diagnosis  C50.811,Z17.1 (ICD-10-CM) - Malignant neoplasm of overlapping sites of right breast in female, estrogen receptor negative (HCC)  C50.811 (ICD-10-CM) - Malignant neoplasm of overlapping sites of right female breast, unspecified estrogen receptor status (HCC)   THERAPY DIAG:  Aftercare following surgery for neoplasm  Difficulty in walking, not elsewhere classified  ONSET DATE: 02/20/22  Rationale for Evaluation and Treatment: Rehabilitation  SUBJECTIVE:                                                                                                                                                                                           SUBJECTIVE STATEMENT:  I felt good after last visit, the weights were better. I really like the hip machine. I also have  been working on using my pulleys like you showed me at home.   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 It measures 3.8 cm and is located in the inner lower quadrant. 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 2 nodes taken at biopsy - 1 was negative the other was positive. Had blood transfusion on 10/05/22. Rt mastectomy and SLNB on 11/01/22 2/6 nodes positive. Currently in radiation and getting Keytruda .  Hx of DVT and stroke in 2015.    PAIN:  Are you having pain? No  PRECAUTIONS: Right lymphedema risk   RED FLAGS: None   WEIGHT BEARING RESTRICTIONS: No  FALLS:  Has patient fallen in last 6 months? No I do have a fear of falling.  I can feel like my legs with crossed up,  LIVING ENVIRONMENT: Lives with: lives with their family - husband and son Lives in: House/apartment Has following equipment at home: Single point cane, Counselling psychologist, Environmental consultant - 2 wheeled, shower chair, and bed side commode  OCCUPATION: on disability since stroke 2015  LEISURE: I get up and down the stairs, trying to walk outside   HAND DOMINANCE: right   PRIOR LEVEL OF FUNCTION: Independent  PATIENT GOALS: Improve my balance - when I get up I sway a little bit      OBJECTIVE: Note: Objective measures were completed at Evaluation unless otherwise noted.  COGNITION: Overall cognitive status: Within functional limits for tasks assessed   SENSATION: WNL with darcofoot testing bil WNL vibration testing  POSTURE: rounded shoulders  09/03/23: Standing: bil genu recurvatum with most weight on the Left leg  MMT: Seated:  knees and ankles 4+/5 Hip Flexion 4+/5 bil Hip abduction 4/5 bil  Flexibility: 09/03/23: Hamstrings: Rt to 65, Lt to 45 Calf in supine: DF to neutral only bil  FUNCTIONAL TESTS:  Reports no trouble with endurance - omitted 6 min walk   BERG: 45/56 See flow sheet for  details  37-45 = Significant (>80%) fall risk  Score 44 - 46.5 = patient should use cane indoor  09/03/23: remains  45/56   DGI: 22: See flow sheet for detials:  Dynamic Gait Index Scores of 19 or less are predictive of falls in older community living adults    GAIT: Distance walked: in clinic Assistive device utilized: none Level of assistance: Complete Independence Comments: appearance of Rt knee flexion during stance time, Slight wide BOS  L-DEX LYMPHEDEMA SCREENING: Currently getting SOZO - in the red today, 09/17/23, see assessment                                                                                                                              TREATMENT DATE:  10/01/23: Therapeutic Exercises  NuStep LE 5: level 5 x 12 mins with 614 steps for bil LE strength and endurance Bil leg press 30# 2 x 10 (4 holes showing), pt reports this felt a little easier today so ready to increase next session Hip Machine 25#: flex x 10, and ext x 15, abd 10# x 10 Vibration plate for HS stretch x 2 each, 20-30 sec each; then heel raise x 30 sec heel raise, then lunge Lt, then Rt x 1 min each returning therapist demo; seated rest after Neuro Re Ed In // bars for following: Heel-toe front and then retro walking x 4 laps with less UE support required today, 2-3 laps without UE support; slow and controlled marching with focus on kinesthetic awareness of elevated foot 2 laps; grapevine Rt and Lt x 2 laps each returning therapist demo Standing in corner on Airex with purple ball toss at varying heights x 2 mins. Therapeutic Activities Free Motion Machine standing on airex for bil scap retract and then bil UE ext 3# x 10 reps and with core engaged/mini squat and with SBA-CGA  09/24/23: Therapeutic Exercises  NuStep LE 5: level 5 x 12 mins, 580 steps with slower pace today Bil leg press 30# 2 x 10 with 1 min rest between sets Hip Machine 25# flex x 10 each leg returning therapist demo Seated  edge of chair for HS stretch x 2 reps, 20 sec each, then piriformis figure 4 stretch x 2 reps, 20 sec each Then in // bars for gastroc stretch in runners lunge x 2 reps, x 20 sec each Pulleys briefly to show pt proper technique about decreasing Rt scapular compensation into flex and abd so pt can use these properly at home Neuro Re Ed In // bars: Front and retro heel-toe walking on airex beam with VC's for less UE support, x 4 reps each Standing in corner on Airex with purple ball toss at varying heights x 2 mins. During this time spoke with pt about using her pulleys at home to help her decrease Rt axillary tightness.  On mini tramp for Weight shifting 3 ways x 1 min each with except with Rt forward +2 HHA, then alt marching x 1:30 mins with VC's to decrease Rt side trunk lean, pt with improved technique to Lt side today Therapeutic  Activities Free Motion Machine with pt standing on airex for bil scap retract with 3# x 10 reps, then bil shoulder ext 3# x 10 reps with pt returning demo and VC's to engage core.   09/17/23: Therapeutic Exercises  NuStep LE 5: level 5 x 12 mins, 778 steps with increased resistance Therapeutic Activities On mini tramp for following: Weight shifting 3 ways x 1 min each with +1 HHA except with Rt forward +2 HHA, then alt marching x 1:30 mins with VC's to decrease bil side trunk lean In // bars: Hip 3 way raises with contralateral SLS on blue oval 2 x 12 each with 2# on each ankle with VC's and demo during for correct LE position and to limit trunk compensations; between sets HS stretch and figure 4 piriformis stretch 1x each, 15-20 sec holds Free Motion Machine for bil scap retract with 3# x 10 reps, then bil shoulder ext 3# x 10 reps with pt returning demo On Airex in corner for ball throw at varying heights x 2:30 mins 6" step up front with +1 HHA with 3" SLS on step with each rep x 12 each On Airex beam at side of TM for following with +1 HHA: Front and retro heel-toe  walking, then crossover in front to Rt and Lt x 2 laps each  Gastroc stretch in runner's lunge x 3 reps, 20-30 sec, seated EOB for bil HS stretch and then figure 4 piriformis stretch x 2 reps, 20 sec holds The SOZO done at end of session for 3 month check up.  09/11/23: Therapeutic Exercises  NuStep LE 5: level 5 x 11 mins, 510 steps with increased resistance Neuro Re Ed In // bars for following: Front heel-toe walking x 4, retro heel-toe walking x 3 laps Slow and controlled high knee marching 3 laps Grapevine to Rt 2 laps, Lt 1 laps On Airex in corner for ball throw at varying heights x 3 mins , 2 LOB but pt able to self correct 6" step ups x 10 each leg with 3" SLS on each rep Wall Push Ups x 10 with pt returning therapist demo  Free Motion Machine for bil scap retract with 3# while also standing on Airex x 15 reps with CGA throughout. VC's for for scap retract at end motion Seated EOB for bil LE stretching: HS stretch, then figure 4 piriformis stretch x 2 reps, 20 sec holds, then in // bars for holding lunge position for gastroc stretch x 3 each, 20 sec holds  09/03/23: Therapeutic Exercises NuStep UE 9/LE level 4 x 11 mins and 670 steps - noticing that pts left foot does not want to stay down on the plate and pt discussing how she has trouble getting her shoes on because her foot doesn't want to go flat.   In supine checked hamstring and calf flexibility see above Negative clonus Performed hamstring stretch with strap 2x20" bil with PT minA holding heel - and trying to add some DF with the pull.  Seated hamstring stretch x 20" each Standing wall calf stretch 2x20" bil with education  Added both to HEP Assessed standing posture without shoes: pt stands in genu recurvatum with most weight on the left leg DL squat: loses balance posteriorly but able to correct Neuro Re Ed In parallel bars: redid Berg balance scale and checked goals Then heel walking x 2 lengths and 2 lengths of toe  walking with HHA PRN Then on trampoline weight shift Rt / Lt without hands  with pt having trouble shifting weight vs just leaning trunk Weight shifts in stance position forward and backward but this was hard so we moved to the floor to do this.       PATIENT EDUCATION:  Education details: Standing bil LE strength and balance Person educated: Patient Education method: Explanation, demonstration, and handouts issued Education comprehension: verbalized understanding, returned demonstration, VC's and will benefit from further review  HOME EXERCISE PROGRAM: Walking, posture 07/31/23 - Standing bil LE strength and balance activities Access Code: [email or web address] URL: [email or web address] Date: 09/03/2023 Prepared by: Judy Null  Exercises - Seated Hamstring Stretch  - 1-3 x daily - 7 x weekly - 1 sets - 3 reps - 20-30 seconds hold - Gastroc Stretch on Wall  - 1-3 x daily - 7 x weekly - 1 sets - 3 reps - 20-30 seconds hold  ASSESSMENT:  CLINICAL IMPRESSION: Pt reports feeling better this week from fall last week. She wanted to cont with leg press and hip machine and was able to tolerate more exs on hip machine today. 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 Pt reports this felt very good and she did feel some sensation in her mid foot after. She conts to be challenged by balance activities.   OBJECTIVE IMPAIRMENTS: Abnormal gait, decreased balance, and difficulty walking.   ACTIVITY LIMITATIONS: locomotion level  PARTICIPATION LIMITATIONS: none  PERSONAL FACTORS: Age, Fitness, Time since onset of injury/illness/exacerbation, and 1-2 comorbidities: chemo hx are also affecting patient's functional outcome.   REHAB POTENTIAL: Excellent  CLINICAL DECISION MAKING: Stable/uncomplicated  EVALUATION COMPLEXITY: Low  GOALS: Goals reviewed with patient? Yes  SHORT  TERM GOALS=LTGs: Target date: 10/15/23  Pt will  improve BERG balance score to 50/56 to decrease risk of falling  Baseline:45 Goal status: ONGOING  2.  Pt will improve single leg stance to at least 6" on bil legs to decrease risk of falling and improve gait  Baseline: 1-2" bil; 07/31/23 - 2-3" bil, 09/03/23 1-2" Goal status: ONGOING  3.  Pt will be ind with final HEP for continued strength and mobility  Baseline:  Goal status: ONGOING   PLAN:  PT FREQUENCY: 2x/week  PT DURATION: 6 weeks - after radiation ends  PLANNED INTERVENTIONS: [id number]- PT Re-evaluation, 97110-Therapeutic exercises, 97530- Therapeutic activity, 97112- Neuromuscular re-education, 97535- Self Care, 16109- Manual therapy, [phone]- Gait training, Patient/Family education, Balance training, Joint mobilization, Therapeutic exercises, Therapeutic activity, Neuromuscular re-education, Gait training, and Self Care  PLAN FOR NEXT SESSION: Increase leg press to 35# .Cont balance work and general TE and cont to progress pt as she tolerates. Cont weight shifting on mini tramp and pregait activities. Stretching calf and hamstrings and encouraging pt in same.    Denyce Flank, PTA 10/01/2023, 1:14 PM

## 2023-10-08 ENCOUNTER — Ambulatory Visit: Attending: Radiation Oncology

## 2023-10-08 DIAGNOSIS — Z483 Aftercare following surgery for neoplasm: Secondary | ICD-10-CM

## 2023-10-08 DIAGNOSIS — R262 Difficulty in walking, not elsewhere classified: Secondary | ICD-10-CM | POA: Diagnosis not present

## 2023-10-08 NOTE — Therapy (Signed)
 OUTPATIENT PHYSICAL THERAPY   ONCOLOGY TREATMENT  Patient Name: Marilyn Ford MRN: [id number] DOB:05-20-1960, 63 y.o., female Today's Date: 10/08/2023  END OF SESSION:  PT End of Session - 10/08/23 1009     Visit Number 12    Number of Visits 19    Date for PT Re-Evaluation 10/15/23    Authorization Type none needed    PT Start Time 1004    PT Stop Time 1102    PT Time Calculation (min) 58 min    Activity Tolerance Patient tolerated treatment well    Behavior During Therapy [facility] for tasks assessed/performed               Past Medical History:  Diagnosis Date   Allergy    Anxiety    Arthritis    Blood transfusion without reported diagnosis    Breast cancer (HCC) 10/2022   right breast   Diabetes mellitus without complication (HCC)    GERD (gastroesophageal reflux disease)    Heart murmur    as a child   History of kidney stones    Hypertension    Pneumonia    Stroke [facility])    Vaginal Pap smear, abnormal    Past Surgical History:  Procedure Laterality Date   BREAST BIOPSY  10/31/2022   US  RT RADIOACTIVE SEED LOC 10/31/2022 [facility]AMMOGRAP[facility]   CESAREAN SECTION     CHOLECYSTECTOMY     COLONOSCOPY WITH PROPOFOL  N/A 07/19/2020   Procedure: COLONOSCOPY WITH PROPOFOL ;  Surgeon: Urban Garden, MD;  Location: [facility];  Service: Gastroenterology;  Laterality: N/A;  AM   MASTECTOMY Right 2024   POLYPECTOMY  07/19/2020   Procedure: POLYPECTOMY;  Surgeon: Urban Garden, MD;  Location: [facility];  Service: Gastroenterology;;   PORTACATH PLACEMENT N/A 03/20/2022   Procedure: INSERTION PORT-A-CATH;  Surgeon: Oza Blumenthal, MD;  Location: [facility];  Service: General;  Laterality: N/A;   RADIOACTIVE SEED GUIDED AXILLARY SENTINEL LYMPH NODE Right 11/01/2022   Procedure: RADIOACTIVE SEED GUIDED RIGHT AXILLARY SENTINEL LYMPH NODE DISSECTION;  Surgeon: Oza Blumenthal, MD;  Location: [facility];  Service: General;  Laterality:  Right;   SIMPLE MASTECTOMY WITH AXILLARY SENTINEL NODE BIOPSY Right 11/01/2022   Procedure: RIGHT SIMPLE MASTECTOMY;  Surgeon: Oza Blumenthal, MD;  Location: [facility];  Service: General;  Laterality: Right;   Patient Active Problem List   Diagnosis Date Noted   S/P mastectomy, right 11/01/2022   Genetic testing 06/05/2022   Neutropenia, drug-induced (HCC) 05/28/2022   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) 03/05/2022   Encounter for screening fecal occult blood testing 03/01/2022   Routine cervical smear 03/01/2022   Axillary adenopathy 02/20/2022   Mass of lower outer quadrant of right breast 01/30/2022   Cat bite of index finger 09/12/2021   Gastroesophageal reflux disease without esophagitis 10/20/2019   GAD (generalized anxiety disorder) 10/20/2019   Insomnia due to medical condition 10/20/2019   Hemiparesis affecting right side as late effect of cerebrovascular accident (CVA) (HCC) 04/22/2015   Diabetes mellitus type II, controlled (HCC) 12/23/2014   Hypertension 12/23/2014    PCP: Roise Cleaver, MD  REFERRING PROVIDER: Alwin Baars, NP  REFERRING DIAG:  Diagnosis  C50.811,Z17.1 (ICD-10-CM) - Malignant neoplasm of overlapping sites of right breast in female, estrogen receptor negative (HCC)  C50.811 (ICD-10-CM) - Malignant neoplasm of overlapping sites of right female breast, unspecified estrogen receptor status (HCC)   THERAPY DIAG:  Aftercare following surgery for neoplasm  Difficulty in walking, not elsewhere classified  ONSET DATE: 02/20/22  Rationale for Evaluation and Treatment: Rehabilitation  SUBJECTIVE:                                                                                                                                                                                           SUBJECTIVE STATEMENT:  I felt good after last visit. I really liked the vibration plate. I felt really good after we did  that last time. I could tell my feet were contacting the ground better.   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 It measures 3.8 cm and is located in the inner lower quadrant. 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 2 nodes taken at biopsy - 1 was negative the other was positive. Had blood transfusion on 10/05/22. Rt mastectomy and SLNB on 11/01/22 2/6 nodes positive. Currently in radiation and getting Keytruda .  Hx of DVT and stroke in 2015.    PAIN:  Are you having pain? No  PRECAUTIONS: Right lymphedema risk   RED FLAGS: None   WEIGHT BEARING RESTRICTIONS: No  FALLS:  Has patient fallen in last 6 months? No I do have a fear of falling.  I can feel like my legs with crossed up,  LIVING ENVIRONMENT: Lives with: lives with their family - husband and son Lives in: House/apartment Has following equipment at home: Single point cane, Counselling psychologist, Environmental consultant - 2 wheeled, shower chair, and bed side commode  OCCUPATION: on disability since stroke 2015  LEISURE: I get up and down the stairs, trying to walk outside   HAND DOMINANCE: right   PRIOR LEVEL OF FUNCTION: Independent  PATIENT GOALS: Improve my balance - when I get up I sway a little bit      OBJECTIVE: Note: Objective measures were completed at Evaluation unless otherwise noted.  COGNITION: Overall cognitive status: Within functional limits for tasks assessed   SENSATION: WNL with darcofoot testing bil WNL vibration testing  POSTURE: rounded shoulders  09/03/23: Standing: bil genu recurvatum with most weight on the Left leg  MMT: Seated:  knees and ankles 4+/5 Hip Flexion 4+/5 bil Hip abduction 4/5 bil  Flexibility: 09/03/23: Hamstrings: Rt to 65, Lt to 45 Calf in supine: DF to neutral only bil  FUNCTIONAL TESTS:  Reports no trouble with endurance - omitted 6 min walk   BERG: 45/56 See flow  sheet for details  37-45 = Significant (>80%) fall risk  Score 44 - 46.5 = patient should use cane indoor  09/03/23:  remains 45/56   DGI: 22: See flow sheet for detials:  Dynamic Gait Index Scores of 19 or less are predictive of falls in older community living adults    GAIT: Distance walked: in clinic Assistive device utilized: none Level of assistance: Complete Independence Comments: appearance of Rt knee flexion during stance time, Slight wide BOS  L-DEX LYMPHEDEMA SCREENING: Currently getting SOZO - in the red today, 09/17/23, see assessment                                                                                                                              TREATMENT DATE:  10/08/23: Therapeutic Exercises  NuStep LE 5: level 5 x 12 mins with 599 steps for bil LE strength and endurance Bil leg press 35#, 2 x 10 (4 holes showing) Hip Machine 25#: Bil LE 3 way raises into flex, abd (except Rt LE x 8 reps) and ext x 12 each Vibration plate for HS stretch x 2 each, x 30 sec each; then heel raise x 30 sec heel raise, then lunge Lt, then Rt x 30 sec each returning therapist demo Had pt return demo of calf stretch hanging heels off step for her to do at home.  Neuro Re Ed In // bars for following: Heel-toe front and then retro walking x 4 laps each with fingertip support of just one hand; Toe walking front and retro x 4 each; slow and controlled marching with focus on kinesthetic awareness of elevated foot 2 laps; grapevine Rt and Lt x 2 laps each returning therapist demo as this is still very challenging for pt Standing in corner on Airex with purple ball toss at varying heights x 2 mins. Therapeutic Activities Free Motion Machine standing on airex for bil scap retract 2 x 10 and then bil UE ext 3# 2 x 15 reps and with core engaged/mini squat and with SBA.  10/01/23: Therapeutic Exercises  NuStep LE 5: level 5 x 12 mins with 614 steps for bil LE strength and endurance Bil leg  press 30# 2 x 10 (4 holes showing), pt reports this felt a little easier today so ready to increase next session Hip Machine 25#: flex x 10, and ext x 15, abd 10# x 10 Vibration plate for HS stretch x 2 each, 20-30 sec each; then heel raise x 30 sec heel raise, then lunge Lt, then Rt x 1 min each returning therapist demo; seated rest after Neuro Re Ed In // bars for following: Heel-toe front and then retro walking x 4 laps with less UE support required today, 2-3 laps without UE support; slow and controlled marching with focus on kinesthetic awareness of elevated foot 2 laps; grapevine Rt and Lt x 2 laps each returning therapist demo Standing in corner on Airex with purple ball toss at varying heights x 2 mins. Therapeutic Activities Free Motion Machine standing on airex for bil scap retract and then bil UE ext 3# x  10 reps and with core engaged/mini squat and with SBA-CGA  09/24/23: Therapeutic Exercises  NuStep LE 5: level 5 x 12 mins, 580 steps with slower pace today Bil leg press 30# 2 x 10 with 1 min rest between sets Hip Machine 25# flex x 10 each leg returning therapist demo Seated edge of chair for HS stretch x 2 reps, 20 sec each, then piriformis figure 4 stretch x 2 reps, 20 sec each Then in // bars for gastroc stretch in runners lunge x 2 reps, x 20 sec each Pulleys briefly to show pt proper technique about decreasing Rt scapular compensation into flex and abd so pt can use these properly at home Neuro Re Ed In // bars: Front and retro heel-toe walking on airex beam with VC's for less UE support, x 4 reps each Standing in corner on Airex with purple ball toss at varying heights x 2 mins. During this time spoke with pt about using her pulleys at home to help her decrease Rt axillary tightness.  On mini tramp for Weight shifting 3 ways x 1 min each with except with Rt forward +2 HHA, then alt marching x 1:30 mins with VC's to decrease Rt side trunk lean, pt with improved technique to Lt  side today Therapeutic Activities Free Motion Machine with pt standing on airex for bil scap retract with 3# x 10 reps, then bil shoulder ext 3# x 10 reps with pt returning demo and VC's to engage core.        PATIENT EDUCATION:  Education details: Standing bil LE strength and balance Person educated: Patient Education method: Explanation, demonstration, and handouts issued Education comprehension: verbalized understanding, returned demonstration, VC's and will benefit from further review  HOME EXERCISE PROGRAM: Walking, posture 07/31/23 - Standing bil LE strength and balance activities Access Code: [email or web address] URL: [email or web address] Date: 09/03/2023 Prepared by: Judy Null  Exercises - Seated Hamstring Stretch  - 1-3 x daily - 7 x weekly - 1 sets - 3 reps - 20-30 seconds hold - Gastroc Stretch on Wall  - 1-3 x daily - 7 x weekly - 1 sets - 3 reps - 20-30 seconds hold  ASSESSMENT:  CLINICAL IMPRESSION: Cont to progress pt as she tolerates. Increased leg press weights and then reps with hip machine. Pt conts to be challenged by balance activities. Educated her about TENS unit and that this may be something she would like to get for home use since she is noticing good improvements with vibration plate at clinic. Showed her some on [employer] at end of session during rest break.   OBJECTIVE IMPAIRMENTS: Abnormal gait, decreased balance, and difficulty walking.   ACTIVITY LIMITATIONS: locomotion level  PARTICIPATION LIMITATIONS: none  PERSONAL FACTORS: Age, Fitness, Time since onset of injury/illness/exacerbation, and 1-2 comorbidities: chemo hx are also affecting patient's functional outcome.   REHAB POTENTIAL: Excellent  CLINICAL DECISION MAKING: Stable/uncomplicated  EVALUATION COMPLEXITY: Low  GOALS: Goals reviewed with patient? Yes  SHORT TERM GOALS=LTGs: Target date: 10/15/23  Pt will  improve BERG balance score to 50/56 to decrease risk of falling   Baseline:45 Goal status: ONGOING  2.  Pt will improve single leg stance to at least 6" on bil legs to decrease risk of falling and improve gait  Baseline: 1-2" bil; 07/31/23 - 2-3" bil, 09/03/23 1-2" Goal status: ONGOING  3.  Pt will be ind with final HEP for continued strength and mobility  Baseline:  Goal status: ONGOING   PLAN:  PT FREQUENCY: 2x/week  PT DURATION: 6 weeks - after radiation ends  PLANNED INTERVENTIONS: [id number]- PT Re-evaluation, 97110-Therapeutic exercises, 97530- Therapeutic activity, 97112- Neuromuscular re-education, 97535- Self Care, [id number]- Manual therapy, [phone]- Gait training, Patient/Family education, Balance training, Joint mobilization, Therapeutic exercises, Therapeutic activity, Neuromuscular re-education, Gait training, and Self Care  PLAN FOR NEXT SESSION: Interested in pursuing TENS unit? Cont balance work and general TE and cont to progress pt as she tolerates. Cont weight shifting on mini tramp and pregait activities. Stretching calf and hamstrings and encouraging pt in same.    Denyce Flank, PTA 10/08/2023, 11:06 AM

## 2023-10-15 ENCOUNTER — Ambulatory Visit

## 2023-10-15 DIAGNOSIS — Z483 Aftercare following surgery for neoplasm: Secondary | ICD-10-CM

## 2023-10-15 DIAGNOSIS — R262 Difficulty in walking, not elsewhere classified: Secondary | ICD-10-CM

## 2023-10-15 NOTE — Therapy (Signed)
 OUTPATIENT PHYSICAL THERAPY   ONCOLOGY TREATMENT  Patient Name: Marilyn Ford MRN: [id number] DOB:07-31-1960, 63 y.o., female Today's Date: 10/15/2023  END OF SESSION:  PT End of Session - 10/15/23 1021     Visit Number 13    Number of Visits 19    Date for PT Re-Evaluation 10/15/23    Authorization Type none needed    PT Start Time 1003    PT Stop Time 1100    PT Time Calculation (min) 57 min    Activity Tolerance Patient tolerated treatment well    Behavior During Therapy [facility] for tasks assessed/performed               Past Medical History:  Diagnosis Date   Allergy    Anxiety    Arthritis    Blood transfusion without reported diagnosis    Breast cancer (HCC) 10/2022   right breast   Diabetes mellitus without complication (HCC)    GERD (gastroesophageal reflux disease)    Heart murmur    as a child   History of kidney stones    Hypertension    Pneumonia    Stroke [facility])    Vaginal Pap smear, abnormal    Past Surgical History:  Procedure Laterality Date   BREAST BIOPSY  10/31/2022   US  RT RADIOACTIVE SEED LOC 10/31/2022 [facility]MO[facility]   CESAREAN SECTION     CHOLECYSTECTOMY     COLONOSCOPY WITH PROPOFOL  N/A 07/19/2020   Procedure: COLONOSCOPY WITH PROPOFOL ;  Surgeon: Urban Garden, MD;  Location: [facility];  Service: Gastroenterology;  Laterality: N/A;  AM   MASTECTOMY Right 2024   POLYPECTOMY  07/19/2020   Procedure: POLYPECTOMY;  Surgeon: Urban Garden, MD;  Location: [facility];  Service: Gastroenterology;;   PORTACATH PLACEMENT N/A 03/20/2022   Procedure: INSERTION PORT-A-CATH;  Surgeon: Oza Blumenthal, MD;  Location: [facility];  Service: General;  Laterality: N/A;   RADIOACTIVE SEED GUIDED AXILLARY SENTINEL LYMPH NODE Right 11/01/2022   Procedure: RADIOACTIVE SEED GUIDED RIGHT AXILLARY SENTINEL LYMPH NODE DISSECTION;  Surgeon: Oza Blumenthal, MD;  Location: [facility];  Service: General;  Laterality:  Right;   SIMPLE MASTECTOMY WITH AXILLARY SENTINEL NODE BIOPSY Right 11/01/2022   Procedure: RIGHT SIMPLE MASTECTOMY;  Surgeon: Oza Blumenthal, MD;  Location: [facility];  Service: General;  Laterality: Right;   Patient Active Problem List   Diagnosis Date Noted   S/P mastectomy, right 11/01/2022   Genetic testing 06/05/2022   Neutropenia, drug-induced (HCC) 05/28/2022   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) 03/05/2022   Encounter for screening fecal occult blood testing 03/01/2022   Routine cervical smear 03/01/2022   Axillary adenopathy 02/20/2022   Mass of lower outer quadrant of right breast 01/30/2022   Cat bite of index finger 09/12/2021   Gastroesophageal reflux disease without esophagitis 10/20/2019   GAD (generalized anxiety disorder) 10/20/2019   Insomnia due to medical condition 10/20/2019   Hemiparesis affecting right side as late effect of cerebrovascular accident (CVA) (HCC) 04/22/2015   Diabetes mellitus type II, controlled (HCC) 12/23/2014   Hypertension 12/23/2014    PCP: Roise Cleaver, MD  REFERRING PROVIDER: Alwin Baars, NP  REFERRING DIAG:  Diagnosis  C50.811,Z17.1 (ICD-10-CM) - Malignant neoplasm of overlapping sites of right breast in female, estrogen receptor negative (HCC)  C50.811 (ICD-10-CM) - Malignant neoplasm of overlapping sites of right female breast, unspecified estrogen receptor status (HCC)   THERAPY DIAG:  Aftercare following surgery for neoplasm  Difficulty in walking, not elsewhere classified  ONSET DATE: 02/20/22  Rationale for Evaluation and Treatment: Rehabilitation  SUBJECTIVE:                                                                                                                                                                                           SUBJECTIVE STATEMENT:  I went to grandsons graduation.I didn't have a problem with the walking. My biggest issue is going  down my steps. I used to be able to do that without holding on. I just don't feel steady enough to do it without holding on yet. Other than that, I'm doing good with ADLs around my house now.   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 It measures 3.8 cm and is located in the inner lower quadrant. 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 2 nodes taken at biopsy - 1 was negative the other was positive. Had blood transfusion on 10/05/22. Rt mastectomy and SLNB on 11/01/22 2/6 nodes positive. Currently in radiation and getting Keytruda .  Hx of DVT and stroke in 2015.    PAIN:  Are you having pain? No  PRECAUTIONS: Right lymphedema risk   RED FLAGS: None   WEIGHT BEARING RESTRICTIONS: No  FALLS:  Has patient fallen in last 6 months? No I do have a fear of falling.  I can feel like my legs with crossed up,  LIVING ENVIRONMENT: Lives with: lives with their family - husband and son Lives in: House/apartment Has following equipment at home: Single point cane, Counselling psychologist, Environmental consultant - 2 wheeled, shower chair, and bed side commode  OCCUPATION: on disability since stroke 2015  LEISURE: I get up and down the stairs, trying to walk outside   HAND DOMINANCE: right   PRIOR LEVEL OF FUNCTION: Independent  PATIENT GOALS: Improve my balance - when I get up I sway a little bit      OBJECTIVE: Note: Objective measures were completed at Evaluation unless otherwise noted.  COGNITION: Overall cognitive status: Within functional limits for tasks assessed   SENSATION: WNL with darcofoot testing bil WNL vibration testing  POSTURE: rounded shoulders  09/03/23: Standing: bil genu recurvatum with most weight on the Left leg  MMT: Seated:  knees and ankles 4+/5 Hip Flexion 4+/5 bil Hip abduction 4/5 bil  Flexibility: 09/03/23: Hamstrings: Rt to 65, Lt to 45 Calf in supine: DF to  neutral only bil  FUNCTIONAL TESTS:  Reports no trouble with endurance - omitted 6 min walk   BERG: 45/56 See  flow sheet for details  37-45 = Significant (>80%) fall risk  Score 44 - 46.5 = patient should use cane indoor  09/03/23: remains 45/56   DGI: 22: See flow sheet for detials:  Dynamic Gait Index Scores of 19 or less are predictive of falls in older community living adults    GAIT: Distance walked: in clinic Assistive device utilized: none Level of assistance: Complete Independence Comments: appearance of Rt knee flexion during stance time, Slight wide BOS  L-DEX LYMPHEDEMA SCREENING: Currently getting SOZO - in the red today, 09/17/23, see assessment                                                                                                                              TREATMENT DATE:  10/15/23: Therapeutic Exercises  NuStep LE 5: level 5 x 12 mins with 578 steps for bil LE strength and endurance Bil leg press 35#, x 25 (4 holes showing) Hip Machine 30#: Bil LE 3 way raises into flex, abd and ext x 12 each (except bil abd x 8 reps due to hip pain at lateral superior thigh) Vibration plate for HS stretch x 30 sec each; then heel raise x 1 min sec heel raise, then lunge Lt, then Rt x 1 min each returning therapist demo Neuro Re Ed In // bars for following: Heel-toe front and then retro walking x 4 laps each with fingertip support of just one hand to no HHA intermittently as able, improvement noted with this today with front; Slow and controlled marching x 2; Toe walking front and retro x 4 each; slow and controlled marching with focus on kinesthetic awareness of elevated foot 2 laps; grapevine Rt and Lt x 2 laps each returning therapist demo as this is still very challenging for pt Therapeutic Activities Free Motion Machine standing on airex for bil scap retract 3# x 20 and then bil UE ext 3# 2 x 15 reps and with core engaged and SBA.  10/08/23: Therapeutic Exercises   NuStep LE 5: level 5 x 12 mins with 599 steps for bil LE strength and endurance Bil leg press 35#, 2 x 10 (4 holes showing) Hip Machine 25#: Bil LE 3 way raises into flex, abd (except Rt LE x 8 reps) and ext x 12 each Vibration plate for HS stretch x 2 each, x 30 sec each; then heel raise x 30 sec heel raise, then lunge Lt, then Rt x 30 sec each returning therapist demo Had pt return demo of calf stretch hanging heels off step for her to do at home.  Neuro Re Ed In // bars for following: Heel-toe front and then retro walking x 4 laps each with fingertip support of just one hand; Toe walking front and retro x 4 each; slow and controlled marching with focus on kinesthetic awareness of elevated foot 2 laps; grapevine Rt and Lt x 2 laps each returning therapist demo as this is still very challenging  for pt Standing in corner on Airex with purple ball toss at varying heights x 2 mins. Therapeutic Activities Free Motion Machine standing on airex for bil scap retract 2 x 10 and then bil UE ext 3# 2 x 15 reps and with core engaged/mini squat and with SBA.  10/01/23: Therapeutic Exercises  NuStep LE 5: level 5 x 12 mins with 614 steps for bil LE strength and endurance Bil leg press 30# 2 x 10 (4 holes showing), pt reports this felt a little easier today so ready to increase next session Hip Machine 25#: flex x 10, and ext x 15, abd 10# x 10 Vibration plate for HS stretch x 2 each, 20-30 sec each; then heel raise x 30 sec heel raise, then lunge Lt, then Rt x 1 min each returning therapist demo; seated rest after Neuro Re Ed In // bars for following: Heel-toe front and then retro walking x 4 laps with less UE support required today, 2-3 laps without UE support; slow and controlled marching with focus on kinesthetic awareness of elevated foot 2 laps; grapevine Rt and Lt x 2 laps each returning therapist demo Standing in corner on Airex with purple ball toss at varying heights x 2 mins. Therapeutic  Activities Free Motion Machine standing on airex for bil scap retract and then bil UE ext 3# x 10 reps and with core engaged/mini squat and with SBA-CGA       PATIENT EDUCATION:  Education details: Standing bil LE strength and balance Person educated: Patient Education method: Explanation, demonstration, and handouts issued Education comprehension: verbalized understanding, returned demonstration, VC's and will benefit from further review  HOME EXERCISE PROGRAM: Walking, posture 07/31/23 - Standing bil LE strength and balance activities Access Code: [email or web address] URL: [email or web address] Date: 09/03/2023 Prepared by: Judy Null  Exercises - Seated Hamstring Stretch  - 1-3 x daily - 7 x weekly - 1 sets - 3 reps - 20-30 seconds hold - Gastroc Stretch on Wall  - 1-3 x daily - 7 x weekly - 1 sets - 3 reps - 20-30 seconds hold  ASSESSMENT:  CLINICAL IMPRESSION: Progressed weights with hip machine. Overall pt is feeling stronger and tolerates therapy without rest breaks. She does still struggle with unsteady gait but reports this has improved overall since start of care. Her biggest complaint is still needing to hold the rail when going downstairs at her home. Advised her that is okay to do for safety. Pt is progressing well and will be ready for D/C at next session per POC. Issued handout about TENS units so she could purchase one at home and try to have to bring with her to next session for instruction to try on her feet.   OBJECTIVE IMPAIRMENTS: Abnormal gait, decreased balance, and difficulty walking.   ACTIVITY LIMITATIONS: locomotion level  PARTICIPATION LIMITATIONS: none  PERSONAL FACTORS: Age, Fitness, Time since onset of injury/illness/exacerbation, and 1-2 comorbidities: chemo hx are also affecting patient's functional outcome.   REHAB POTENTIAL: Excellent  CLINICAL DECISION MAKING: Stable/uncomplicated  EVALUATION COMPLEXITY: Low  GOALS: Goals reviewed  with patient? Yes  SHORT TERM GOALS=LTGs: Target date: 10/15/23  Pt will  improve BERG balance score to 50/56 to decrease risk of falling  Baseline:45 Goal status: ONGOING  2.  Pt will improve single leg stance to at least 6" on bil legs to decrease risk of falling and improve gait  Baseline: 1-2" bil; 07/31/23 - 2-3" bil, 09/03/23 1-2" Goal status: ONGOING  3.  Pt will be ind  with final HEP for continued strength and mobility  Baseline:  Goal status: ONGOING   PLAN:  PT FREQUENCY: 2x/week  PT DURATION: 6 weeks - after radiation ends  PLANNED INTERVENTIONS: 97164- PT Re-evaluation, 97110-Therapeutic exercises, 97530- Therapeutic activity, 97112- Neuromuscular re-education, 97535- Self Care, 16109- Manual therapy, [phone]- Gait training, Patient/Family education, Balance training, Joint mobilization, Therapeutic exercises, Therapeutic activity, Neuromuscular re-education, Gait training, and Self Care  PLAN FOR NEXT SESSION: Reassess goals and D/C next. If pt brings TENS unit instruct on use of this for her CIPN since she was noting improvement with vibration plate. Add step downs and add to HEP to help improvement stability with going down stairs.     Denyce Flank, PTA 10/15/2023, 12:22 PM

## 2023-10-22 ENCOUNTER — Ambulatory Visit: Payer: Self-pay

## 2023-10-22 VITALS — Wt 175.0 lb

## 2023-10-22 DIAGNOSIS — R262 Difficulty in walking, not elsewhere classified: Secondary | ICD-10-CM

## 2023-10-22 DIAGNOSIS — Z483 Aftercare following surgery for neoplasm: Secondary | ICD-10-CM | POA: Diagnosis not present

## 2023-10-22 NOTE — Therapy (Addendum)
 OUTPATIENT PHYSICAL THERAPY   ONCOLOGY TREATMENT  Patient Name: Marilyn Ford MRN: [id number] DOB:Jun 22, 1960, 63 y.o., female Today's Date: 10/22/2023  END OF SESSION:  PT End of Session - 10/22/23 1107     Visit Number 14    Number of Visits 19    Date for PT Re-Evaluation 10/15/23   last session today   Authorization Type none needed    PT Start Time 1103    PT Stop Time 1159    PT Time Calculation (min) 56 min    Activity Tolerance Patient tolerated treatment well    Behavior During Therapy [facility] for tasks assessed/performed            Past Medical History:  Diagnosis Date   Allergy    Anxiety    Arthritis    Blood transfusion without reported diagnosis    Breast cancer (HCC) 10/2022   right breast   Diabetes mellitus without complication (HCC)    GERD (gastroesophageal reflux disease)    Heart murmur    as a child   History of kidney stones    Hypertension    Pneumonia    Stroke [facility])    Vaginal Pap smear, abnormal    Past Surgical History:  Procedure Laterality Date   BREAST BIOPSY  10/31/2022   US  RT RADIOACTIVE SEED LOC 10/31/2022 [facility] MAMMOGRAPHY   CESAREAN SECTION     CHOLECYSTECTOMY     COLONOSCOPY WITH PROPOFOL  N/A 07/19/2020   Procedure: COLONOSCOPY WITH PROPOFOL ;  Surgeon: Eartha Angelia Sieving, MD;  Location: [facility];  Service: Gastroenterology;  Laterality: N/A;  AM   MASTECTOMY Right 2024   POLYPECTOMY  07/19/2020   Procedure: POLYPECTOMY;  Surgeon: Eartha Angelia Sieving, MD;  Location: [facility];  Service: Gastroenterology;;   PORTACATH PLACEMENT N/A 03/20/2022   Procedure: INSERTION PORT-A-CATH;  Surgeon: Vernetta Berg, MD;  Location: [facility];  Service: General;  Laterality: N/A;   RADIOACTIVE SEED GUIDED AXILLARY SENTINEL LYMPH NODE Right 11/01/2022   Procedure: RADIOACTIVE SEED GUIDED RIGHT AXILLARY SENTINEL LYMPH NODE DISSECTION;  Surgeon: Vernetta Berg, MD;  Location: [facility];  Service:  General;  Laterality: Right;   SIMPLE MASTECTOMY WITH AXILLARY SENTINEL NODE BIOPSY Right 11/01/2022   Procedure: RIGHT SIMPLE MASTECTOMY;  Surgeon: Vernetta Berg, MD;  Location: [facility];  Service: General;  Laterality: Right;   Patient Active Problem List   Diagnosis Date Noted   S/P mastectomy, right 11/01/2022   Genetic testing 06/05/2022   Neutropenia, drug-induced (HCC) 05/28/2022   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) 03/05/2022   Encounter for screening fecal occult blood testing 03/01/2022   Routine cervical smear 03/01/2022   Axillary adenopathy 02/20/2022   Mass of lower outer quadrant of right breast 01/30/2022   Cat bite of index finger 09/12/2021   Gastroesophageal reflux disease without esophagitis 10/20/2019   GAD (generalized anxiety disorder) 10/20/2019   Insomnia due to medical condition 10/20/2019   Hemiparesis affecting right side as late effect of cerebrovascular accident (CVA) (HCC) 04/22/2015   Diabetes mellitus type II, controlled (HCC) 12/23/2014   Hypertension 12/23/2014    PCP: Butler Der, MD  REFERRING PROVIDER: Morna Kendall, NP  REFERRING DIAG:  Diagnosis  C50.811,Z17.1 (ICD-10-CM) - Malignant neoplasm of overlapping sites of right breast in female, estrogen receptor negative (HCC)  C50.811 (ICD-10-CM) - Malignant neoplasm of overlapping sites of right female breast, unspecified estrogen receptor status (HCC)   THERAPY DIAG:  Aftercare following surgery for  neoplasm  Difficulty in walking, not elsewhere classified  ONSET DATE: 02/20/22  Rationale for Evaluation and Treatment: Rehabilitation  SUBJECTIVE:                                                                                                                                                                                           SUBJECTIVE STATEMENT:  I am doing well overall, I'm ready to make today my last day.   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 It measures 3.8 cm and is located in the inner lower quadrant. 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 2 nodes taken at biopsy - 1 was negative the other was positive. Had blood transfusion on 10/05/22. Rt mastectomy and SLNB on 11/01/22 2/6 nodes positive. Currently in radiation and getting Keytruda .  Hx of DVT and stroke in 2015.    PAIN:  Are you having pain? No  PRECAUTIONS: Right lymphedema risk   RED FLAGS: None   WEIGHT BEARING RESTRICTIONS: No  FALLS:  Has patient fallen in last 6 months? No I do have a fear of falling.  I can feel like my legs with crossed up  LIVING ENVIRONMENT: Lives with: lives with their family - husband and son Lives in: House/apartment Has following equipment at home: Single point cane, Counselling psychologist, Environmental consultant - 2 wheeled, shower chair, and bed side commode  OCCUPATION: on disability since stroke 2015  LEISURE: I get up and down the stairs, trying to walk outside   HAND DOMINANCE: right   PRIOR LEVEL OF FUNCTION: Independent  PATIENT GOALS: Improve my balance - when I get up I sway a little bit      OBJECTIVE: Note: Objective measures were completed at Evaluation unless otherwise noted.  COGNITION: Overall cognitive status: Within functional limits for tasks assessed   SENSATION: WNL with darcofoot testing bil WNL vibration testing  POSTURE: rounded shoulders  09/03/23: Standing: bil genu recurvatum with most weight on the Left leg  MMT: Seated:  knees and ankles 4+/5 Hip Flexion 4+/5 bil Hip abduction 4/5 bil  Flexibility: 09/03/23: Hamstrings: Rt to 65, Lt to 45 Calf in supine: DF to neutral only bil  FUNCTIONAL TESTS:  Reports no trouble with endurance - omitted 6 min walk   BERG: 45/56 See flow sheet for details ; 10/22/23 - 51/56 37-45 = Significant (>80%) fall risk  Score 44 -  46.5 = patient should use cane indoor  09/03/23: remains 45/56   DGI: 22: See flow sheet for detials:  Dynamic Gait  Index Scores of 19 or less are predictive of falls in older community living adults    GAIT: Distance walked: in clinic Assistive device utilized: none Level of assistance: Complete Independence Comments: appearance of Rt knee flexion during stance time, Slight wide BOS  L-DEX LYMPHEDEMA SCREENING: Currently getting SOZO - in the red today, 09/17/23, see assessment  L-DEX FLOWSHEETS - 10/22/23 1100       L-DEX LYMPHEDEMA SCREENING   Measurement Type Unilateral    L-DEX MEASUREMENT EXTREMITY Upper Extremity    POSITION  Standing    DOMINANT SIDE Right    At Risk Side Right    BASELINE SCORE (UNILATERAL) -6.8    L-DEX SCORE (UNILATERAL) 9.1    VALUE CHANGE (UNILAT) 15.9                                                                                                                                     TREATMENT DATE:  10/22/23: Therapeutic Exercises  NuStep LE 5: level 5 x 11 mins with 769 steps for bil LE strength and endurance Hip Machine 25#: Bil LE 3 way raises into flex (30# with Rt LE flex), abd (Lt LE x 5) and ext x 10 each,  Vibration plate for HS stretch x 30 sec each; then heel raise x 30 sec sec heel raise, then lunge Lt, then Rt x 30 sec each Neuro Re Ed In // bars: Heel-toe front and retro walking x 3 laps BERG balance test redone for goal assess. 51/56 now, goal met. Self Care SOZO redone. Pt has a high change from baseline (in red) but has also burnt herself 2 different times on oven so swelling may be healing from that. Pt measured for a compression sleeve (size III/ew, sand) and issued this to her and she will return x 1 month. Educated her about wearing compression sleeve, see below and answered her questions regarding this.  Assessed pts goals and discussed current functional status in preparation for D/C.   10/15/23: Therapeutic Exercises   NuStep LE 5: level 5 x 12 mins with 578 steps for bil LE strength and endurance Bil leg press 35#, x 25 (4 holes showing) Hip Machine 30#: Bil LE 3 way raises into flex, abd and ext x 12 each (except bil abd x 8 reps due to hip pain at lateral superior thigh) Vibration plate for HS stretch x 30 sec each; then heel raise x 1 min sec heel raise, then lunge Lt, then Rt x 1 min each returning therapist demo Neuro Re Ed In // bars for following: Heel-toe front and then retro walking x 4 laps each with fingertip support of just one hand to no HHA intermittently as able, improvement noted with this today with front; Slow and controlled marching x 2; Toe walking front and retro x 4 each; slow and controlled marching with focus on kinesthetic awareness of elevated foot 2 laps; grapevine Rt  and Lt x 2 laps each returning therapist demo as this is still very challenging for pt Therapeutic Activities Free Motion Machine standing on airex for bil scap retract 3# x 20 and then bil UE ext 3# 2 x 15 reps and with core engaged and SBA.  10/08/23: Therapeutic Exercises  NuStep LE 5: level 5 x 12 mins with 599 steps for bil LE strength and endurance Bil leg press 35#, 2 x 10 (4 holes showing) Hip Machine 25#: Bil LE 3 way raises into flex, abd (except Rt LE x 8 reps) and ext x 12 each Vibration plate for HS stretch x 2 each, x 30 sec each; then heel raise x 30 sec heel raise, then lunge Lt, then Rt x 30 sec each returning therapist demo Had pt return demo of calf stretch hanging heels off step for her to do at home.  Neuro Re Ed In // bars for following: Heel-toe front and then retro walking x 4 laps each with fingertip support of just one hand; Toe walking front and retro x 4 each; slow and controlled marching with focus on kinesthetic awareness of elevated foot 2 laps; grapevine Rt and Lt x 2 laps each returning therapist demo as this is still very challenging for pt Standing in corner on Airex with purple ball  toss at varying heights x 2 mins. Therapeutic Activities Free Motion Machine standing on airex for bil scap retract 2 x 10 and then bil UE ext 3# 2 x 15 reps and with core engaged/mini squat and with SBA.  10/01/23: Therapeutic Exercises  NuStep LE 5: level 5 x 12 mins with 614 steps for bil LE strength and endurance Bil leg press 30# 2 x 10 (4 holes showing), pt reports this felt a little easier today so ready to increase next session Hip Machine 25#: flex x 10, and ext x 15, abd 10# x 10 Vibration plate for HS stretch x 2 each, 20-30 sec each; then heel raise x 30 sec heel raise, then lunge Lt, then Rt x 1 min each returning therapist demo; seated rest after Neuro Re Ed In // bars for following: Heel-toe front and then retro walking x 4 laps with less UE support required today, 2-3 laps without UE support; slow and controlled marching with focus on kinesthetic awareness of elevated foot 2 laps; grapevine Rt and Lt x 2 laps each returning therapist demo Standing in corner on Airex with purple ball toss at varying heights x 2 mins. Therapeutic Activities Free Motion Machine standing on airex for bil scap retract and then bil UE ext 3# x 10 reps and with core engaged/mini squat and with SBA-CGA       PATIENT EDUCATION:  Education details: Standing bil LE strength and balance Person educated: Patient Education method: Explanation, demonstration, and handouts issued Education comprehension: verbalized understanding, returned demonstration, VC's and will benefit from further review  HOME EXERCISE PROGRAM: Walking, posture 07/31/23 - Standing bil LE strength and balance activities Access Code: [email or web address] URL: [email or web address] Date: 09/03/2023 Prepared by: Saddie Raw  Exercises - Seated Hamstring Stretch  - 1-3 x daily - 7 x weekly - 1 sets - 3 reps - 20-30 seconds hold - Gastroc Stretch on Wall  - 1-3 x daily - 7 x weekly - 1 sets - 3 reps - 20-30 seconds  hold  ASSESSMENT:  CLINICAL IMPRESSION: D/C this visit. Pt has met 2/3 goals and showed progress to remaining of improving her bil SLS. Pt is ready  for D/C at this time from this episode of care. She was issued a compression sleeve due to having a high change from baseline and will return x 1 month after wearing her compression sleeve daily x 10 hrs/day as she was instructed. May need to have PT to learn self MLD if her change from baseline does not reduce back to green. Pt verbalizes understanding.   OBJECTIVE IMPAIRMENTS: Abnormal gait, decreased balance, and difficulty walking.   ACTIVITY LIMITATIONS: locomotion level  PARTICIPATION LIMITATIONS: none  PERSONAL FACTORS: Age, Fitness, Time since onset of injury/illness/exacerbation, and 1-2 comorbidities: chemo hx are also affecting patient's functional outcome.   REHAB POTENTIAL: Excellent  CLINICAL DECISION MAKING: Stable/uncomplicated  EVALUATION COMPLEXITY: Low  GOALS: Goals reviewed with patient? Yes  SHORT TERM GOALS=LTGs: Target date: 10/15/23  Pt will  improve BERG balance score to 50/56 to decrease risk of falling  Baseline:45; 10/22/23 - 51/56 Goal status: MET  2.  Pt will improve single leg stance to at least 6 on bil legs to decrease risk of falling and improve gait  Baseline: 1-2 bil; 07/31/23 - 2-3 bil, 09/03/23 1-2; 10/22/23 - Lt SLS 5, Rt 2-3  Goal status: UNMET     3.  Pt will be ind with final HEP for continued strength and mobility  Baseline:  Goal status: MET   PLAN:  PT FREQUENCY: 2x/week  PT DURATION: 6 weeks - after radiation ends  PLANNED INTERVENTIONS: 97164- PT Re-evaluation, 97110-Therapeutic exercises, 97530- Therapeutic activity, 97112- Neuromuscular re-education, 97535- Self Care, 02859- Manual therapy, [phone]- Gait training, Patient/Family education, Balance training, Joint mobilization, Therapeutic exercises, Therapeutic activity, Neuromuscular re-education, Gait training, and Self  Care  PLAN FOR NEXT SESSION: D/C this visit. Pt to return in 1 month for SOZO reassess due to high change from baseline that is possibly due to pt has burnt her arm on oven x 2 in last month which caused increased swelling.  If still elevated at that time pt will benefit from PT to learn self MLD.   Aden Berwyn Caldron, PTA 10/22/2023, 1:29 PM    PLEASE KEEP YOUR COMPRESSION GARMENT ON DURING THE DAY TO GET THE BEST SWELLING REDUCTION. HERE ARE SOME ADDITIONAL TIPS: Do not sleep in your garment. If you have pain or notice swelling in your hand or at the top of your shoulder, call your therapist. This may be a sign that you need a different garment. 3.  Take good care of your garment so it lasts longer: Follow washing instructions on your garment label or box. Wash periodically using a mild detergent in warm water.  Do not use fabric softener or bleach.  Place garment in a mesh lingerie bag and use the gentle cycle of the washing machine or hand wash. Tumble dry low or lay flat to dry. TAKE CARE OF YOUR SKIN Apply a low pH moisturizing lotion to your skin daily Avoid scratching your skin Treat skin irritations quickly  Know the 5 warning signs of infection: redness, pain, warmth to touch, fever and increased swelling.  Call your physician immediately if you notice any of these signs of a possible infection.  Cancer Rehab [phone]  PHYSICAL THERAPY DISCHARGE SUMMARY  Visits from Start of Care: 14  Current functional level related to goals / functional outcomes: See above   Remaining deficits: Lymphedema risk, balance deficits,    Education / Equipment: HEP  Plan: Patient agrees to discharge.

## 2023-10-27 ENCOUNTER — Other Ambulatory Visit: Payer: Self-pay | Admitting: Family Medicine

## 2023-10-31 ENCOUNTER — Other Ambulatory Visit: Payer: Self-pay | Admitting: Hematology

## 2023-11-02 IMAGING — DX DG FINGER INDEX 2+V*R*
3 series · 3 of 3 positions shown · non-contrast
Comparison: None.

CLINICAL DATA: Infection of the right index finger.

EXAM:
RIGHT INDEX FINGER 2+V

[finger ap]
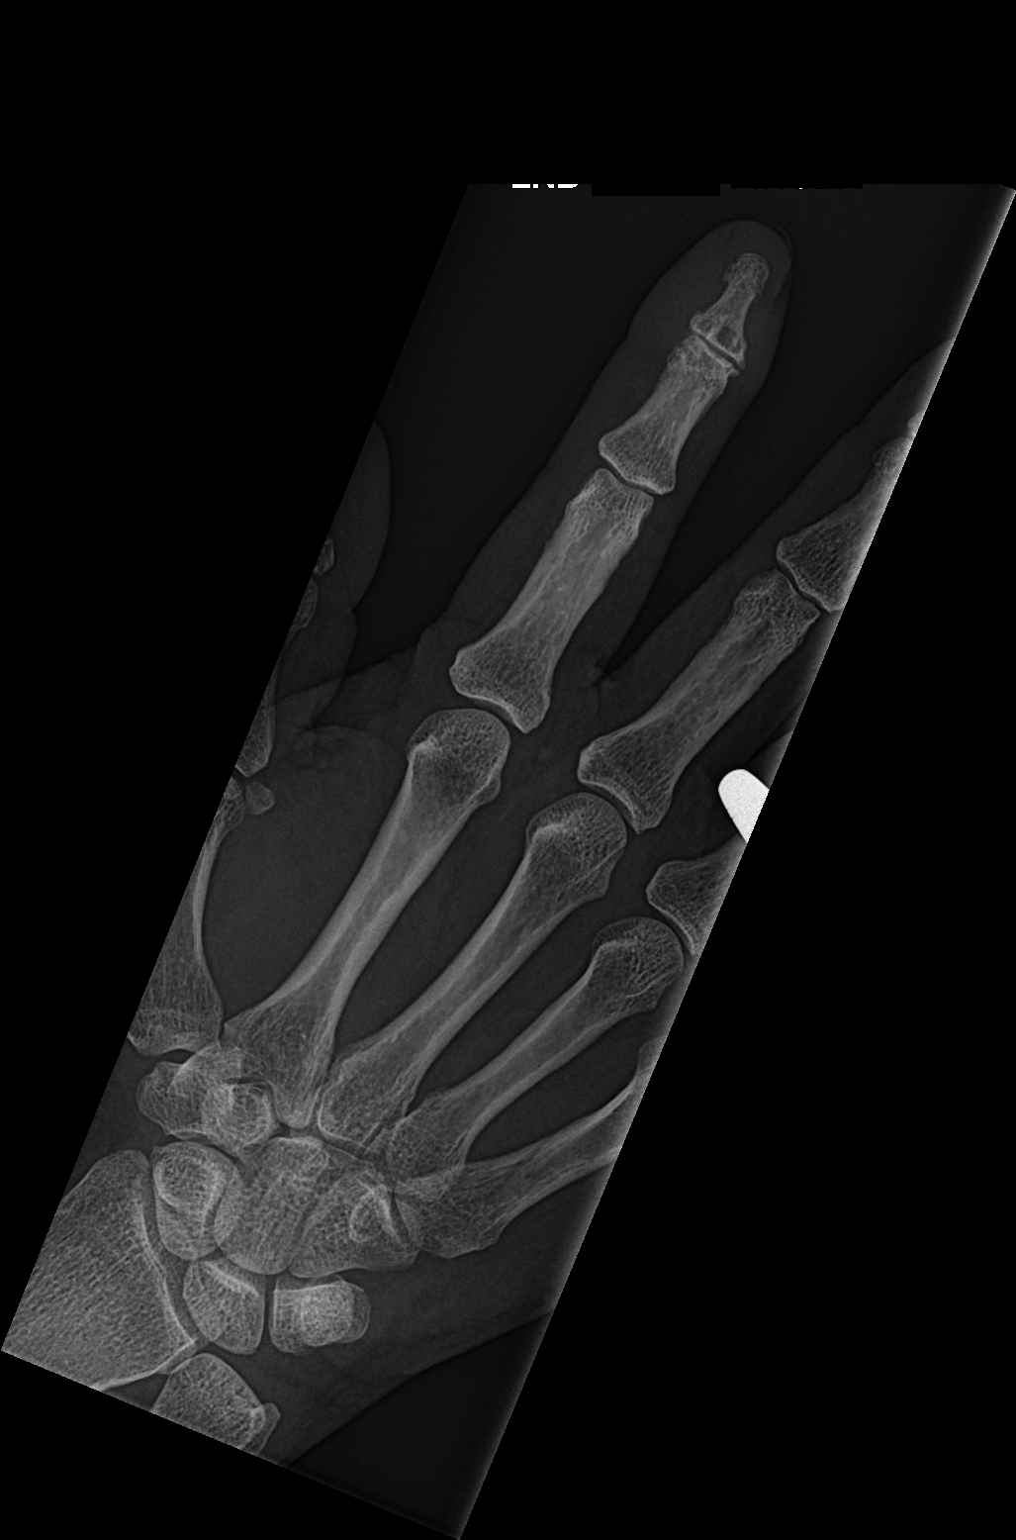

[finger obl]
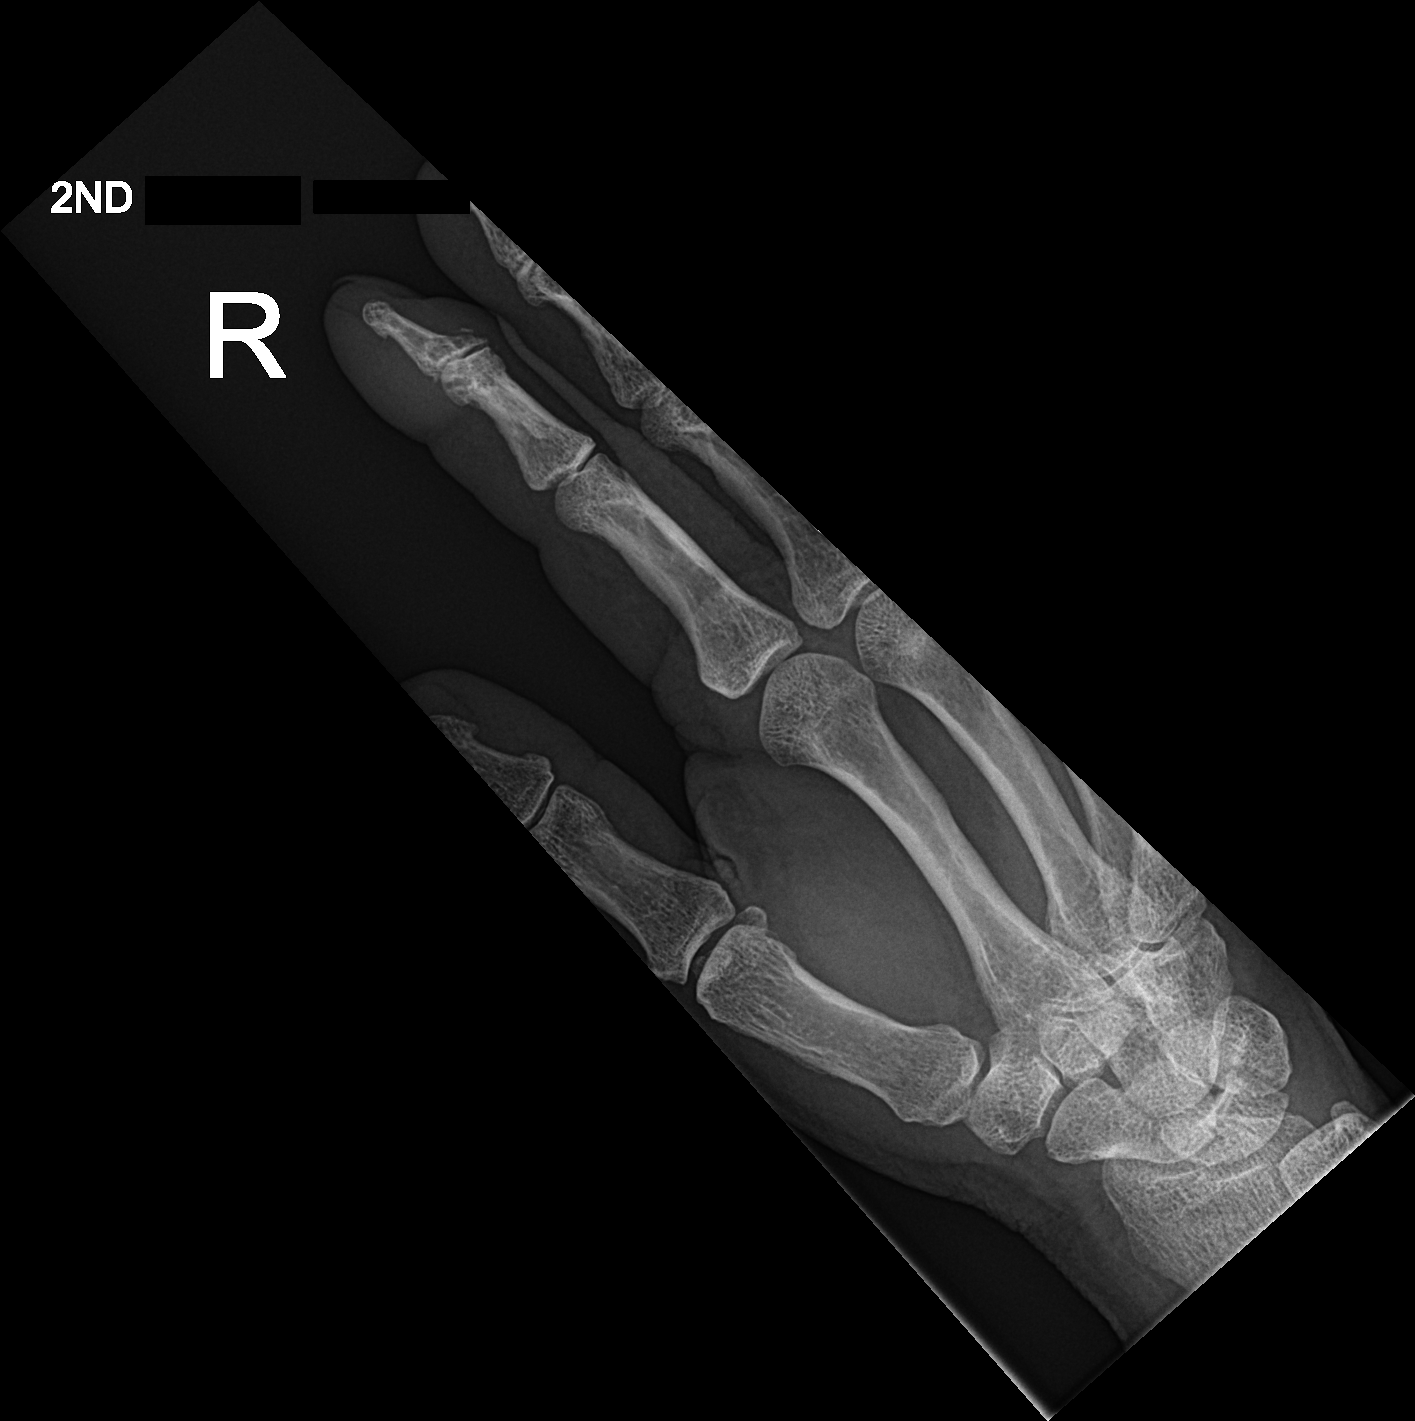

[finger lat]
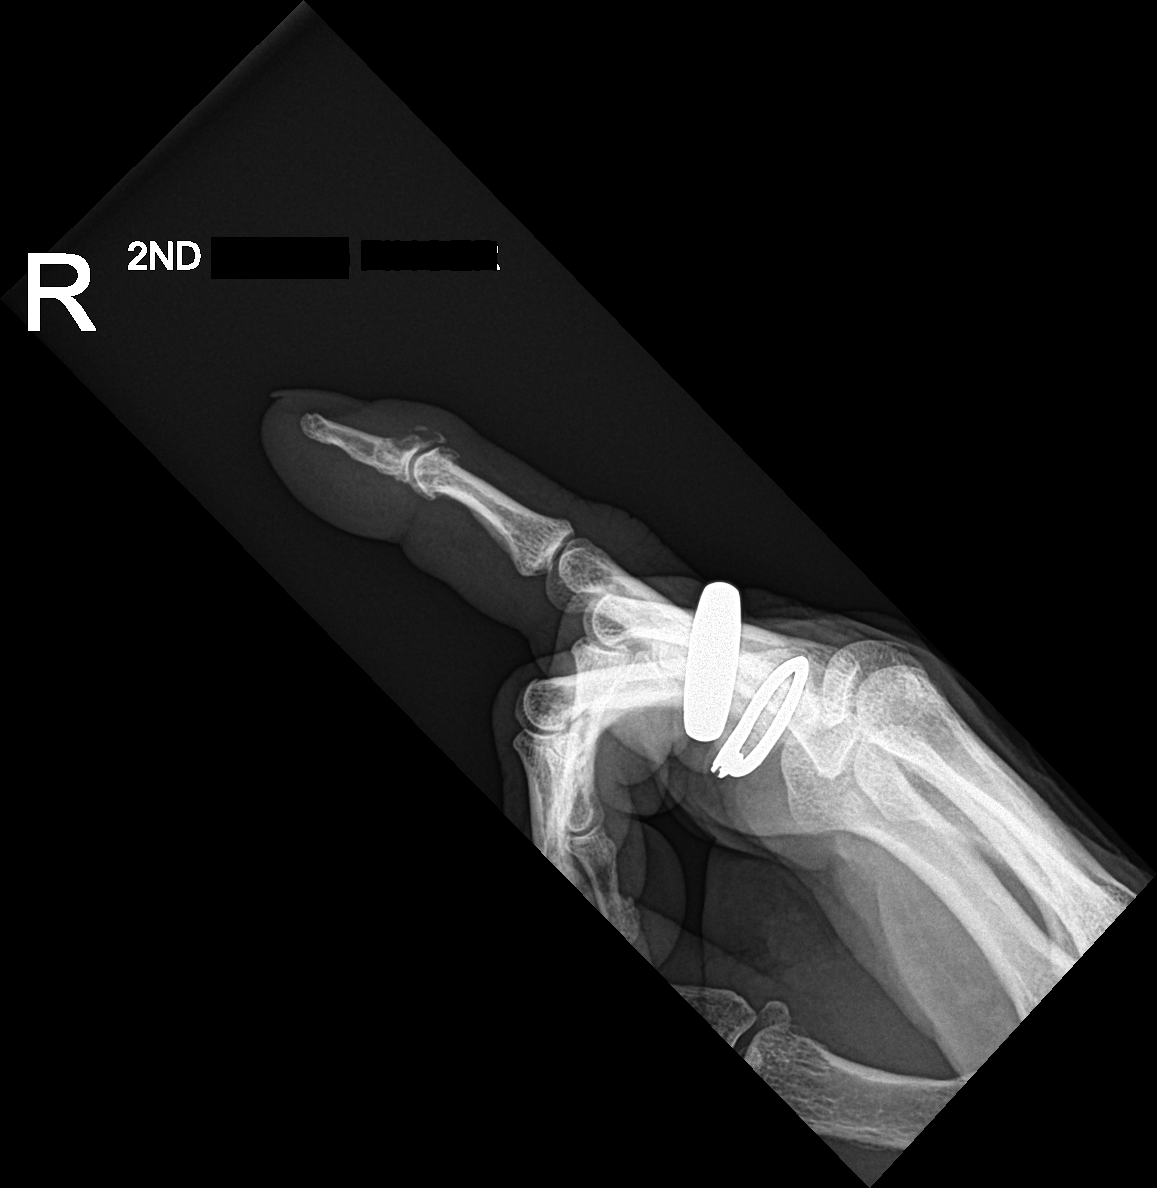

[3 of 3 positions shown; findings below may reference images not displayed]

FINDINGS: There is osteopenia with mild bone destruction of the proximal
aspect of the second distal phalanx. Osteomyelitis is not excluded.
Surrounding soft tissue swelling is noted.
IMPRESSION: There is osteopenia with mild bone destruction of the proximal
aspect of the second distal phalanx. Osteomyelitis is not excluded.
Surrounding soft tissue swelling is noted. No soft tissue gas is
noted.

## 2023-11-09 ENCOUNTER — Other Ambulatory Visit: Payer: Self-pay | Admitting: Family Medicine

## 2023-11-14 ENCOUNTER — Inpatient Hospital Stay: Attending: Hematology and Oncology

## 2023-11-14 DIAGNOSIS — Z9221 Personal history of antineoplastic chemotherapy: Secondary | ICD-10-CM | POA: Insufficient documentation

## 2023-11-14 DIAGNOSIS — C773 Secondary and unspecified malignant neoplasm of axilla and upper limb lymph nodes: Secondary | ICD-10-CM | POA: Insufficient documentation

## 2023-11-14 DIAGNOSIS — Z1732 Human epidermal growth factor receptor 2 negative status: Secondary | ICD-10-CM | POA: Diagnosis not present

## 2023-11-14 DIAGNOSIS — Z808 Family history of malignant neoplasm of other organs or systems: Secondary | ICD-10-CM | POA: Insufficient documentation

## 2023-11-14 DIAGNOSIS — Z1722 Progesterone receptor negative status: Secondary | ICD-10-CM | POA: Diagnosis not present

## 2023-11-14 DIAGNOSIS — Z803 Family history of malignant neoplasm of breast: Secondary | ICD-10-CM | POA: Insufficient documentation

## 2023-11-14 DIAGNOSIS — I890 Lymphedema, not elsewhere classified: Secondary | ICD-10-CM | POA: Diagnosis not present

## 2023-11-14 DIAGNOSIS — Z923 Personal history of irradiation: Secondary | ICD-10-CM | POA: Diagnosis not present

## 2023-11-14 DIAGNOSIS — D696 Thrombocytopenia, unspecified: Secondary | ICD-10-CM | POA: Diagnosis not present

## 2023-11-14 DIAGNOSIS — Z79899 Other long term (current) drug therapy: Secondary | ICD-10-CM | POA: Insufficient documentation

## 2023-11-14 DIAGNOSIS — Z87891 Personal history of nicotine dependence: Secondary | ICD-10-CM | POA: Diagnosis not present

## 2023-11-14 DIAGNOSIS — Z809 Family history of malignant neoplasm, unspecified: Secondary | ICD-10-CM | POA: Insufficient documentation

## 2023-11-14 DIAGNOSIS — C50811 Malignant neoplasm of overlapping sites of right female breast: Secondary | ICD-10-CM | POA: Insufficient documentation

## 2023-11-14 DIAGNOSIS — Z171 Estrogen receptor negative status [ER-]: Secondary | ICD-10-CM | POA: Diagnosis not present

## 2023-11-14 DIAGNOSIS — D508 Other iron deficiency anemias: Secondary | ICD-10-CM

## 2023-11-14 LAB — COMPREHENSIVE METABOLIC PANEL WITH GFR
ALT: 38 U/L (ref 0–44)
AST: 34 U/L (ref 15–41)
Albumin: 3.7 g/dL (ref 3.5–5.0)
Alkaline Phosphatase: 99 U/L (ref 38–126)
Anion gap: 13 (ref 5–15)
BUN: 17 mg/dL (ref 8–23)
CO2: 25 mmol/L (ref 22–32)
Calcium: 9.2 mg/dL (ref 8.9–10.3)
Chloride: 99 mmol/L (ref 98–111)
Creatinine, Ser: 0.9 mg/dL (ref 0.44–1.00)
GFR, Estimated: >60 mL/min (ref >60)
Glucose, Bld: 176 mg/dL — ABNORMAL HIGH (ref 70–99)
Potassium: 4.2 mmol/L (ref 3.5–5.1)
Sodium: 137 mmol/L (ref 135–145)
Total Bilirubin: 0.8 mg/dL (ref 0.0–1.2)
Total Protein: 7 g/dL (ref 6.5–8.1)

## 2023-11-14 LAB — IRON AND TIBC
Iron: 89 ug/dL (ref 28–170)
Saturation Ratios: 30 % (ref 10.4–31.8)
TIBC: 300 ug/dL (ref 250–450)
UIBC: 211 ug/dL

## 2023-11-14 LAB — CBC WITH DIFFERENTIAL/PLATELET
Abs Immature Granulocytes: 0.01 K/uL (ref 0.00–0.07)
Basophils Absolute: 0 K/uL (ref 0.0–0.1)
Basophils Relative: 1 %
Eosinophils Absolute: 0.1 K/uL (ref 0.0–0.5)
Eosinophils Relative: 2 %
HCT: 32.4 % — ABNORMAL LOW (ref 36.0–46.0)
Hemoglobin: 11.2 g/dL — ABNORMAL LOW (ref 12.0–15.0)
Immature Granulocytes: 0 %
Lymphocytes Relative: 17 %
Lymphs Abs: 0.6 K/uL — ABNORMAL LOW (ref 0.7–4.0)
MCH: 35.2 pg — ABNORMAL HIGH (ref 26.0–34.0)
MCHC: 34.6 g/dL (ref 30.0–36.0)
MCV: 101.9 fL — ABNORMAL HIGH (ref 80.0–100.0)
Monocytes Absolute: 0.5 K/uL (ref 0.1–1.0)
Monocytes Relative: 12 %
Neutro Abs: 2.7 K/uL (ref 1.7–7.7)
Neutrophils Relative %: 68 %
Platelets: 132 K/uL — ABNORMAL LOW (ref 150–400)
RBC: 3.18 MIL/uL — ABNORMAL LOW (ref 3.87–5.11)
RDW: 13.9 % (ref 11.5–15.5)
WBC: 3.9 K/uL — ABNORMAL LOW (ref 4.0–10.5)
nRBC: 0 % (ref 0.0–0.2)

## 2023-11-14 LAB — MAGNESIUM: Magnesium: 1.7 mg/dL (ref 1.7–2.4)

## 2023-11-14 LAB — FOLATE: Folate: >40 ng/mL (ref >5.9)

## 2023-11-14 LAB — VITAMIN B12: Vitamin B-12: 395 pg/mL (ref 180–914)

## 2023-11-14 LAB — FERRITIN: Ferritin: 537 ng/mL — ABNORMAL HIGH (ref 11–307)

## 2023-11-14 MED ORDER — HEPARIN SOD (PORK) LOCK FLUSH 100 UNIT/ML IV SOLN
500.0000 [IU] | Freq: Once | INTRAVENOUS | Status: AC
  Administered 2023-11-14: 500 [IU] via INTRAVENOUS

## 2023-11-14 MED ORDER — SODIUM CHLORIDE 0.9% FLUSH
10.0000 mL | Freq: Once | INTRAVENOUS | Status: AC
  Administered 2023-11-14: 10 mL via INTRAVENOUS

## 2023-11-14 NOTE — Progress Notes (Signed)
 Patients port flushed without difficulty.  Good blood return noted with no bruising or swelling noted at site.  Band aid applied.  VSS with discharge and left in satisfactory condition with no s/s of distress noted.

## 2023-11-14 NOTE — Patient Instructions (Signed)
 [facility] [place] - A DEPT OF [facility]  Discharge Instructions: Thank you for choosing [facility] to provide your oncology and hematology care.  If you have a lab appointment with the Cancer Center - please note that after April 8th, 2024, all labs will be drawn in the cancer center.  You do not have to check in or register with the main entrance as you have in the past but will complete your check-in in the cancer center.  Wear comfortable clothing and clothing appropriate for easy access to any Portacath or PICC line.   We strive to give you quality time with your provider. You may need to reschedule your appointment if you arrive late (15 or more minutes).  Arriving late affects you and other patients whose appointments are after yours.  Also, if you miss three or more appointments without notifying the office, you may be dismissed from the clinic at the provider's discretion.      For prescription refill requests, have your pharmacy contact our office and allow 72 hours for refills to be completed.    Today you received the following chemotherapy and/or immunotherapy agents Port flush      To help prevent nausea and vomiting after your treatment, we encourage you to take your nausea medication as directed.  BELOW ARE SYMPTOMS THAT SHOULD BE REPORTED IMMEDIATELY: *FEVER GREATER THAN 100.4 F (38 C) OR HIGHER *CHILLS OR SWEATING *NAUSEA AND VOMITING THAT IS NOT CONTROLLED WITH YOUR NAUSEA MEDICATION *UNUSUAL SHORTNESS OF BREATH *UNUSUAL BRUISING OR BLEEDING *URINARY PROBLEMS (pain or burning when urinating, or frequent urination) *BOWEL PROBLEMS (unusual diarrhea, constipation, pain near the anus) TENDERNESS IN MOUTH AND THROAT WITH OR WITHOUT PRESENCE OF ULCERS (sore throat, sores in mouth, or a toothache) UNUSUAL RASH, SWELLING OR PAIN  UNUSUAL VAGINAL DISCHARGE OR ITCHING   Items with * indicate a potential emergency and should be followed  up as soon as possible or go to the Emergency Department if any problems should occur.  Please show the CHEMOTHERAPY ALERT CARD or IMMUNOTHERAPY ALERT CARD at check-in to the Emergency Department and triage nurse.  Should you have questions after your visit or need to cancel or reschedule your appointment, please contact [facility] [place] - A DEPT OF [facility] [phone]  and follow the prompts.  Office hours are 8:00 a.m. to 4:30 p.m. Monday - Friday. Please note that voicemails left after 4:00 p.m. may not be returned until the following business day.  We are closed weekends and major holidays. You have access to a nurse at all times for urgent questions. Please call the main number to the clinic [phone] and follow the prompts.  For any non-urgent questions, you may also contact your provider using MyChart. We now offer e-Visits for anyone 86 and older to request care online for non-urgent symptoms. For details visit [email or web address].   Also download the MyChart app! Go to the app store, search "MyChart", open the app, select [place], and log in with your MyChart username and password.

## 2023-11-15 LAB — CANCER ANTIGEN 15-3: CA 15-3: 37.1 U/mL — ABNORMAL HIGH (ref 0.0–25.0)

## 2023-11-15 LAB — CANCER ANTIGEN 27.29: CA 27.29: 51 U/mL — ABNORMAL HIGH (ref 0.0–38.6)

## 2023-11-19 ENCOUNTER — Ambulatory Visit

## 2023-11-21 ENCOUNTER — Inpatient Hospital Stay (HOSPITAL_BASED_OUTPATIENT_CLINIC_OR_DEPARTMENT_OTHER): Admitting: Hematology

## 2023-11-21 VITALS — BP 137/73 | HR 68 | Temp 98.0°F | Resp 16 | Wt 174.6 lb

## 2023-11-21 DIAGNOSIS — C773 Secondary and unspecified malignant neoplasm of axilla and upper limb lymph nodes: Secondary | ICD-10-CM | POA: Diagnosis not present

## 2023-11-21 DIAGNOSIS — C50811 Malignant neoplasm of overlapping sites of right female breast: Secondary | ICD-10-CM | POA: Diagnosis not present

## 2023-11-21 DIAGNOSIS — Z171 Estrogen receptor negative status [ER-]: Secondary | ICD-10-CM

## 2023-11-21 NOTE — Patient Instructions (Addendum)
 [facility] at [facility] Discharge Instructions   You were seen and examined today by Dr. Rogers.  He reviewed the results of your lab work which are mostly normal/stable. Your tumor markers are elevated. We will get a PET scan prior to your next.   We will see you back after PET.  Return as scheduled.    Thank you for choosing [facility] at [facility] to provide your oncology and hematology care.  To afford each patient quality time with our provider, please arrive at least 15 minutes before your scheduled appointment time.   If you have a lab appointment with the Cancer Center please come in thru the Main Entrance and check in at the main information desk.  You need to re-schedule your appointment should you arrive 10 or more minutes late.  We strive to give you quality time with our providers, and arriving late affects you and other patients whose appointments are after yours.  Also, if you no show three or more times for appointments you may be dismissed from the clinic at the providers discretion.     Again, thank you for choosing [facility].  Our hope is that these requests will decrease the amount of time that you wait before being seen by our physicians.       _____________________________________________________________  Should you have questions after your visit to [facility], please contact our office at [phone] and follow the prompts.  Our office hours are 8:00 a.m. and 4:30 p.m. Monday - Friday.  Please note that voicemails left after 4:00 p.m. may not be returned until the following business day.  We are closed weekends and major holidays.  You do have access to a nurse 24-7, just call the main number to the clinic [phone] and do not press any options, hold on the line and a nurse will answer the phone.    For prescription refill requests, have your pharmacy contact our office and allow 72  hours.    Due to Covid, you will need to wear a mask upon entering the hospital. If you do not have a mask, a mask will be given to you at the Main Entrance upon arrival. For doctor visits, patients may have 1 support person age 56 or older with them. For treatment visits, patients can not have anyone with them due to social distancing guidelines and our immunocompromised population.
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
## 2023-11-22 DIAGNOSIS — Z008 Encounter for other general examination: Secondary | ICD-10-CM | POA: Diagnosis not present

## 2023-11-22 DIAGNOSIS — G629 Polyneuropathy, unspecified: Secondary | ICD-10-CM | POA: Diagnosis not present

## 2023-11-28 ENCOUNTER — Ambulatory Visit: Payer: Self-pay

## 2023-11-28 ENCOUNTER — Encounter (HOSPITAL_COMMUNITY): Payer: Self-pay

## 2023-11-28 ENCOUNTER — Encounter (HOSPITAL_COMMUNITY)
Admission: RE | Admit: 2023-11-28 | Discharge: 2023-11-28 | Disposition: A | Discharge status: Home / Self Care | Source: Ambulatory Visit | Attending: Hematology | Admitting: Hematology

## 2023-11-28 ENCOUNTER — Ambulatory Visit: Admitting: Family Medicine

## 2023-11-28 VITALS — BP 124/73 | HR 80 | Temp 97.9°F | Ht 62.0 in | Wt 175.0 lb

## 2023-11-28 DIAGNOSIS — E1165 Type 2 diabetes mellitus with hyperglycemia: Secondary | ICD-10-CM

## 2023-11-28 DIAGNOSIS — C50811 Malignant neoplasm of overlapping sites of right female breast: Secondary | ICD-10-CM | POA: Insufficient documentation

## 2023-11-28 DIAGNOSIS — H04123 Dry eye syndrome of bilateral lacrimal glands: Secondary | ICD-10-CM

## 2023-11-28 DIAGNOSIS — Z171 Estrogen receptor negative status [ER-]: Secondary | ICD-10-CM | POA: Insufficient documentation

## 2023-11-28 NOTE — Telephone Encounter (Signed)
  FYI Only or Action Required?: FYI only for provider.  Patient was last seen in primary care on 09/18/2023 by [name], MD.  Called Nurse Triage reporting Hyperglycemia. BS is 327 - pt on steroids.   Symptoms began today. Pt is taking steroids for eyes.  Interventions attempted: Nothing.  Symptoms are: stable.  Triage Disposition: Call PCP Now  Patient/caregiver understands and will follow disposition?: Yes               Copied from CRM #[id number]. Topic: Clinical - Red Word Triage >> Nov 28, 2023  7:59 AM Kaylen BRAVO wrote: Red Word that prompted transfer to Nurse Triage: patient went to PET scan, they would not do the scan, Glucose is  327 Reason for Disposition  [1] Blood glucose > 300 mg/dL (83.2 mmol/L) AND [7] two or more times in a row  Answer Assessment - Initial Assessment Questions 1. BLOOD GLUCOSE: What is your blood glucose level?      327 2. ONSET: When did you check the blood glucose?     At the PET scan center 3. USUAL RANGE: What is your glucose level usually? (e.g., usual fasting morning value, usual evening value)     unsure  5. TYPE 1 or 2:  Do you know what type of diabetes you have?  (e.g., Type 1, Type 2, Gestational; doesn't know)      Type 2 6. INSULIN : Do you take insulin ? What type of insulin (s) do you use? What is the mode of delivery? (syringe, pen; injection or pump)?      Not now 7. DIABETES PILLS: Do you take any pills for your diabetes? If Yes, ask: Have you missed taking any pills recently?     no 8. OTHER SYMPTOMS: Do you have any symptoms? (e.g., fever, frequent urination, difficulty breathing, dizziness, weakness, vomiting)     No - HA  Protocols used: Diabetes - High Blood Sugar-A-AH

## 2023-11-28 NOTE — Progress Notes (Signed)
 Subjective:  Patient ID: Marilyn Ford, female    DOB: 30-Sep-1960  Age: 63 y.o. MRN: [id number]  CC: Hyperglycemia (Checked today and BS was 327 and might have been due to steroids that she is on. Has to be on steroids for two more months. Pt felt weird and nauseous. )  HPI Marilyn Ford presents for elevated glucose based on steroid eyedrops prescribed by her local optometrist.  She started taking eyedrops 5 days ago.  The patient's diabetes has been in remission due to a fairly dramatic weight loss.  As a result she is off all medicines and her A1c has been in a excellent range.  As a result she has not been checking her blood sugar.  However she started feeling bad this morning and checked her blood sugar and found it was 327. She relates that an ophthalmologist that she spoke with unofficially and informally told her that she did not need the steroid she just needed moisturizing drops which she already has at home.  She would like to stop taking the prednisone  drops     11/28/2023    1:01 PM 11/21/2023    2:20 PM 09/18/2023   10:26 AM  Depression screen PHQ 2/9  Decreased Interest 2 2 2   Down, Depressed, Hopeless 2 1 1   PHQ - 2 Score 4 3 3   Altered sleeping 2 2 2   Tired, decreased energy 2 2 2   Change in appetite 1 2 2   Feeling bad or failure about yourself  0 0 0  Trouble concentrating 1 1 1   Moving slowly or fidgety/restless 1 0 1  Suicidal thoughts 0 0 0  PHQ-9 Score 11 10 11   Difficult doing work/chores Somewhat difficult Somewhat difficult Somewhat difficult    History Marilyn Ford has a past medical history of Allergy, Anxiety, Arthritis, Blood transfusion without reported diagnosis, Breast cancer (HCC) (10/2022), Diabetes mellitus without complication (HCC), GERD (gastroesophageal reflux disease), Heart murmur, History of kidney stones, Hypertension, Pneumonia, Stroke (HCC), and Vaginal Pap smear, abnormal.   She has a past surgical history that includes Cholecystectomy;  Cesarean section; Colonoscopy with propofol  (N/A, 07/19/2020); polypectomy (07/19/2020); Portacath placement (N/A, 03/20/2022); Breast biopsy (10/31/2022); Simple mastectomy with axillary sentinel node biopsy (Right, 11/01/2022); Radioactive seed guided axillary sentinel lymph node (Right, 11/01/2022); and Mastectomy (Right, 2024).   Her family history includes Breast cancer in her maternal aunt; Cancer in her maternal grandmother; Diabetes in her brother and mother; Uterine cancer (age of onset: 68 - 9) in her mother.She reports that she quit smoking about 8 years ago. Her smoking use included cigarettes. She started smoking about 23 years ago. She has a 12.5 pack-year smoking history. She has never used smokeless tobacco. She reports current alcohol use of about 2.0 - 3.0 standard drinks of alcohol per week. She reports that she does not use drugs.    ROS Review of Systems  Constitutional: Negative.   HENT: Negative.    Eyes:  Negative for visual disturbance.  Respiratory:  Negative for shortness of breath.   Cardiovascular:  Negative for chest pain.  Gastrointestinal:  Negative for abdominal pain.  Musculoskeletal:  Negative for arthralgias.    Objective:  BP 124/73   Pulse 80   Temp 97.9 F (36.6 C)   Ht 5' 2 (1.575 m)   Wt 175 lb (79.4 kg)   SpO2 98%   BMI 32.01 kg/m   BP Readings from Last 3 Encounters:  11/28/23 124/73  11/21/23 137/73  11/14/23 (!) 163/74  Wt Readings from Last 3 Encounters:  11/28/23 175 lb (79.4 kg)  11/21/23 174 lb 9.7 oz (79.2 kg)  10/22/23 175 lb (79.4 kg)     Physical Exam Constitutional:      General: She is not in acute distress.    Appearance: She is well-developed.  Cardiovascular:     Rate and Rhythm: Normal rate and regular rhythm.  Pulmonary:     Breath sounds: Normal breath sounds.  Musculoskeletal:        General: Normal range of motion.  Skin:    General: Skin is warm and dry.  Neurological:     Mental Status: She is  alert and oriented to person, place, and time.      Assessment & Plan:  Type 2 diabetes mellitus with hyperglycemia, without long-term current use of insulin  (HCC)  Dry eye syndrome of both eyes   She needs to go back on her Trulicity  1.5 temporarily until her sugar comes back down she is going to start monitoring it closely.  Additionally she is going to discontinue the steroid drops.  Should her symptoms not resolve with the over-the-counter gel that she had discussed with ophthalmology, will do a formal referral to ophthalmology.  Follow-up: No follow-ups on file.  Butler Der, M.D.

## 2023-11-28 NOTE — Telephone Encounter (Signed)
 Patient has appointment today

## 2023-12-02 ENCOUNTER — Ambulatory Visit: Attending: Surgery

## 2023-12-02 VITALS — Wt 177.5 lb

## 2023-12-02 DIAGNOSIS — Z483 Aftercare following surgery for neoplasm: Secondary | ICD-10-CM | POA: Insufficient documentation

## 2023-12-02 NOTE — Therapy (Signed)
 OUTPATIENT PHYSICAL THERAPY SOZO SCREENING NOTE   Patient Name: Marilyn Ford MRN: [id number] DOB:12/12/1960, 63 y.o., female Today's Date: 12/02/2023  PCP: [name], MD REFERRING PROVIDER: Vernetta Berg, MD   PT End of Session - 12/02/23 1545     Visit Number 14   # unchanged due to screen only   PT Start Time 1543    PT Stop Time 1548    PT Time Calculation (min) 5 min    Activity Tolerance Patient tolerated treatment well    Behavior During Therapy [facility] for tasks assessed/performed          Past Medical History:  Diagnosis Date   Allergy    Anxiety    Arthritis    Blood transfusion without reported diagnosis    Breast cancer (HCC) 10/2022   right breast   Diabetes mellitus without complication (HCC)    GERD (gastroesophageal reflux disease)    Heart murmur    as a child   History of kidney stones    Hypertension    Pneumonia    Stroke [facility])    Vaginal Pap smear, abnormal    Past Surgical History:  Procedure Laterality Date   BREAST BIOPSY  10/31/2022   US  RT RADIOACTIVE SEED LOC 10/31/2022 [facility] MAMMOGRAP[facility]   CESAREAN SECTION     CHOLECYSTECTOMY     COLONOSCOPY WITH PROPOFOL  N/A 07/19/2020   Procedure: COLONOSCOPY WITH PROPOFOL ;  Surgeon: Eartha Angelia Sieving, MD;  [facility];  Service: Gastroenterology;  Laterality: N/A;  AM   MASTECTOMY Right 2024   POLYPECTOMY  07/19/2020   Procedure: POLYPECTOMY;  Surgeon: Eartha Angelia Sieving, MD;  [facility];  Service: Gastroenterology;;   PORTACATH PLACEMENT N/A 03/20/2022   Procedure: INSERTION PORT-A-CATH;  Surgeon: Vernetta Berg, MD;  Location: [facility];  Service: General;  Laterality: N/A;   RADIOACTIVE SEED GUIDED AXILLARY SENTINEL LYMPH NODE Right 11/01/2022   Procedure: RADIOACTIVE SEED GUIDED RIGHT AXILLARY SENTINEL LYMPH NODE DISSECTION;  Surgeon: Vernetta Berg, MD;  Location: [facility];  Service: General;  Laterality: Right;   SIMPLE  MASTECTOMY WITH AXILLARY SENTINEL NODE BIOPSY Right 11/01/2022   Procedure: RIGHT SIMPLE MASTECTOMY;  Surgeon: Vernetta Berg, MD;  Location: [facility];  Service: General;  Laterality: Right;   Patient Active Problem List   Diagnosis Date Noted   S/P mastectomy, right 11/01/2022   Genetic testing 06/05/2022   Neutropenia, drug-induced (HCC) 05/28/2022   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) 03/05/2022   Encounter for screening fecal occult blood testing 03/01/2022   Routine cervical smear 03/01/2022   Axillary adenopathy 02/20/2022   Mass of lower outer quadrant of right breast 01/30/2022   Cat bite of index finger 09/12/2021   Gastroesophageal reflux disease without esophagitis 10/20/2019   GAD (generalized anxiety disorder) 10/20/2019   Insomnia due to medical condition 10/20/2019   Hemiparesis affecting right side as late effect of cerebrovascular accident (CVA) (HCC) 04/22/2015   Diabetes mellitus type II, controlled (HCC) 12/23/2014   Hypertension 12/23/2014    REFERRING DIAG: right breast cancer at risk for lymphedema  THERAPY DIAG:  Aftercare following surgery for neoplasm  PERTINENT HISTORY: 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 It measures 3.8 cm and is located in the inner lower quadrant. 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 2 nodes taken at biopsy - 1 was negative the other was  positive. Had blood transfusion on 10/05/22. Rt mastectomy and SLNB on 11/01/22 2/6 nodes positive. Currently in radiation and getting Keytruda .  Hx of DVT and stroke in 2015.  PRECAUTIONS: right UE Lymphedema risk, None  SUBJECTIVE: Pt returns for her 1 month L-Dex screen. I've worn my sleeve every day. And I'm walking better too. My doctor wants me to come back for my shoulder.  PAIN:  Are you having pain? No  SOZO SCREENING: Patient was  assessed today using the SOZO machine to determine the lymphedema index score. This was compared to her baseline score. It was determined that she is within the recommended range when compared to her baseline and no further action is needed at this time. She will continue SOZO screenings. These are done every 3 months for 2 years post operatively followed by every 6 months for 2 years, and then annually.  Patient reported a change in status to PTA which initiated the PTA consulting with a PT. PT determined it would be appropriate to initiate therapy at this time. PT requested a referral from patient's provider.    L-DEX FLOWSHEETS - 12/02/23 1500       L-DEX LYMPHEDEMA SCREENING   Measurement Type Unilateral    L-DEX MEASUREMENT EXTREMITY Upper Extremity    POSITION  Standing    DOMINANT SIDE Right    At Risk Side Right    BASELINE SCORE (UNILATERAL) -6.8    L-DEX SCORE (UNILATERAL) -5    VALUE CHANGE (UNILAT) 1.8          P: Resume 3 month SOZO and eval for shoulder ROM deficits.   Aden Berwyn Caldron, PTA 12/02/2023, 3:47 PM

## 2023-12-03 ENCOUNTER — Other Ambulatory Visit: Payer: Self-pay | Admitting: *Deleted

## 2023-12-05 ENCOUNTER — Inpatient Hospital Stay: Admitting: Hematology

## 2023-12-05 ENCOUNTER — Encounter (HOSPITAL_COMMUNITY)
Admission: RE | Admit: 2023-12-05 | Discharge: 2023-12-05 | Disposition: A | Discharge status: Home / Self Care | Source: Ambulatory Visit | Attending: Hematology | Admitting: Hematology

## 2023-12-05 DIAGNOSIS — Z171 Estrogen receptor negative status [ER-]: Secondary | ICD-10-CM | POA: Diagnosis not present

## 2023-12-05 DIAGNOSIS — C761 Malignant neoplasm of thorax: Secondary | ICD-10-CM | POA: Diagnosis not present

## 2023-12-05 DIAGNOSIS — C50811 Malignant neoplasm of overlapping sites of right female breast: Secondary | ICD-10-CM | POA: Diagnosis not present

## 2023-12-05 MED ORDER — FLUDEOXYGLUCOSE F - 18 (FDG) INJECTION
8.7400 | Freq: Once | INTRAVENOUS | Status: AC | PRN
  Administered 2023-12-05: 8.74 via INTRAVENOUS

## 2023-12-09 ENCOUNTER — Other Ambulatory Visit: Payer: Self-pay

## 2023-12-09 DIAGNOSIS — Z171 Estrogen receptor negative status [ER-]: Secondary | ICD-10-CM

## 2023-12-10 NOTE — Therapy (Addendum)
 OUTPATIENT PHYSICAL THERAPY  UPPER EXTREMITY ONCOLOGY EVALUATION  Patient Name: Marilyn Ford MRN: [id number] DOB:12-07-1960, 63 y.o., female Today's Date: 12/11/2023  END OF SESSION:  PT End of Session - 12/11/23 1139     Visit Number 1    Number of Visits 12    Date for PT Re-Evaluation 01/22/24    PT Start Time 1103    PT Stop Time 1139    PT Time Calculation (min) 36 min    Activity Tolerance Patient tolerated treatment well    Behavior During Therapy [facility] for tasks assessed/performed          Past Medical History:  Diagnosis Date   Allergy    Anxiety    Arthritis    Blood transfusion without reported diagnosis    Breast cancer (HCC) 10/2022   right breast   Diabetes mellitus without complication (HCC)    GERD (gastroesophageal reflux disease)    Heart murmur    as a child   History of kidney stones    Hypertension    Pneumonia    Stroke [facility])    Vaginal Pap smear, abnormal    Past Surgical History:  Procedure Laterality Date   BREAST BIOPSY  10/31/2022   US  RT RADIOACTIVE SEED LOC 10/31/2022 [facility] MAMMOGRAPHY   CESAREAN SECTION     CHOLECYSTECTOMY     COLONOSCOPY WITH PROPOFOL  N/A 07/19/2020   Procedure: COLONOSCOPY WITH PROPOFOL ;  Surgeon: Eartha Angelia Sieving, MD;  [facility];  Service: Gastroenterology;  Laterality: N/A;  AM   MASTECTOMY Right 2024   POLYPECTOMY  07/19/2020   Procedure: POLYPECTOMY;  Surgeon: Eartha Angelia Sieving, MD;  [facility];  Service: Gastroenterology;;   PORTACATH PLACEMENT N/A 03/20/2022   Procedure: INSERTION PORT-A-CATH;  Surgeon: Vernetta Berg, MD;  [facility];  Service: General;  Laterality: N/A;   RADIOACTIVE SEED GUIDED AXILLARY SENTINEL LYMPH NODE Right 11/01/2022   Procedure: RADIOACTIVE SEED GUIDED RIGHT AXILLARY SENTINEL LYMPH NODE DISSECTION;  Surgeon: Vernetta Berg, MD;  Location: [facility];  Service: General;  Laterality: Right;   SIMPLE MASTECTOMY  WITH AXILLARY SENTINEL NODE BIOPSY Right 11/01/2022   Procedure: RIGHT SIMPLE MASTECTOMY;  Surgeon: Vernetta Berg, MD;  Location: [facility];  Service: General;  Laterality: Right;   Patient Active Problem List   Diagnosis Date Noted   S/P mastectomy, right 11/01/2022   Genetic testing 06/05/2022   Neutropenia, drug-induced (HCC) 05/28/2022   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) 03/05/2022   Encounter for screening fecal occult blood testing 03/01/2022   Routine cervical smear 03/01/2022   Axillary adenopathy 02/20/2022   Mass of lower outer quadrant of right breast 01/30/2022   Cat bite of index finger 09/12/2021   Gastroesophageal reflux disease without esophagitis 10/20/2019   GAD (generalized anxiety disorder) 10/20/2019   Insomnia due to medical condition 10/20/2019   Hemiparesis affecting right side as late effect of cerebrovascular accident (CVA) (HCC) 04/22/2015   Diabetes mellitus type II, controlled (HCC) 12/23/2014   Hypertension 12/23/2014    PCP:   REFERRING PROVIDER: Dr. 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   Its difficult to get things out of the cabinet. She feels swollen in the right axillary region. Pt is wearing her compression sleeve on the right UE. Her last SOSO screen is well within the green.  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 It measures 3.8 cm and is located in the inner lower quadrant. 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 She completed neoadjuvant chemo for possible inflammatory breast cancer. 2 nodes taken at biopsy - 1 was negative the other was positive. Had blood transfusion on 10/05/22. Rt mastectomy and SLNB on 11/01/22 2/6 nodes positive.s/p radiation completed 07/2023 and Keytruda .  Hx of DVT and stroke in 2015. Concerned about stiffness in her shoulder  PAIN:  Are you having pain? No, discomfort with reaching   PRECAUTIONS: Right UE lymphedema risk  RED FLAGS: None   WEIGHT BEARING RESTRICTIONS: No  FALLS:  Has patient fallen in last 6 months? No  LIVING ENVIRONMENT: Lives with: lives with their spouse  Lives in: House/apartment Has following equipment at home: Counselling psychologist, Environmental consultant - 2 wheeled, shower chair, and bed side commode  OCCUPATION: on disability since stroke 2015  LEISURE: work in garden, take walks  HAND DOMINANCE: right   PRIOR LEVEL OF FUNCTION: Independent  PATIENT GOALS: Improve ROM and use of right arm   OBJECTIVE: Note: Objective measures were completed at Evaluation unless otherwise noted.  COGNITION: Overall cognitive status: Within functional limits for tasks assessed   PALPATION: Tightness noted right pectorals with mild tenderness  OBSERVATIONS / OTHER ASSESSMENTS: mild right axillary swelling  SENSATION: Light touch: Deficits    POSTURE: forward head, rounded shoulders  UPPER EXTREMITY AROM/PROM:  A/PROM RIGHT   eval   Shoulder extension 48  Shoulder flexion 110, pulls under  Shoulder abduction 86 pulls under  Shoulder internal rotation   Shoulder external rotation     (Blank rows = not tested)  A/PROM LEFT   eval  Shoulder extension 65  Shoulder flexion 136  Shoulder abduction 125, more scaption  Shoulder internal rotation 60  Shoulder external rotation 108     (Blank rows = not tested)  CERVICAL AROM: All within fxl limits:      UPPER EXTREMITY STRENGTH:   LYMPHEDEMA ASSESSMENTS:   SURGERY TYPE/DATE: Rt mastectomy and SLNB on 11/01/22   NUMBER OF LYMPH NODES REMOVED: 1/2 with initial biopsy, 2/6 with surgery  CHEMOTHERAPY: yes Neoadjuvant  RADIATION:YES  HORMONE TREATMENT: No Triple Negative  INFECTIONS:    LYMPHEDEMA ASSESSMENTS:   LANDMARK RIGHT  eval  At axilla    15 cm proximal to olecranon process   10 cm proximal to olecranon process   Olecranon process   15 cm proximal to ulnar styloid process   10 cm proximal to ulnar styloid process   Just proximal to ulnar styloid process   Across hand at thumb web space   At base of 2nd digit   (Blank rows = not tested)  LANDMARK LEFT  eval  At axilla    15 cm proximal to olecranon process   10 cm proximal to olecranon process   Olecranon process   15 cm proximal to ulnar styloid process   10 cm proximal to ulnar styloid process   Just proximal to ulnar styloid process   Across hand at thumb web space   At base of 2nd digit   (  Blank rows = not tested)   L-DEX LYMPHEDEMA SCREENING: The patient was assessed using the L-Dex machine today to produce a lymphedema index baseline score. The patient will be reassessed on a regular basis (typically every 3 months) to obtain new L-Dex scores. If the score is > 6.5 points away from his/her baseline score indicating onset of subclinical lymphedema, it will be recommended to wear a compression garment for 4 weeks, 12 hours per day and then be reassessed. If the score continues to be > 6.5 points from baseline at reassessment, we will initiate lymphedema treatment. Assessing in this manner has a 95% rate of preventing clinically significant lymphedema. RECENT SCREEN 12/02/2023 1.8 above baseline; in Green  QUICK DASH SURVEY: 25%                                                                                                                             TREATMENT DATE:  8/6/205 Pt was educated in 4 post op exercises and completed each x 4-5 reps with 5 second hold. Performed supine flexion and star gazer, and standing Scapular retraction and wall slide for abd. Discussed POC, LOS, Treatment interventions. Pt preference is to come 1x/week.  Pt was given information for ABC video to watch as she is not sure if she has done that yet.   PATIENT EDUCATION:  Education details: Educated in 4 post op exercises and being sure to take to gentle stretch Person educated: Patient Education method: Programmer, multimedia, Facilities manager, and Handouts Education comprehension: verbalized understanding and returned demonstration  HOME EXERCISE PROGRAM: 4 post op exercises  ASSESSMENT:  CLINICAL IMPRESSION: Patient is a 63 y.o. female who was seen today for physical therapy evaluation and treatment for complaints of right shoulder stiffness and right axillary swelling s/p Right Mastectomy with SLNB on 11/01/2022. Stiffness in shoulder started at completion of her radiation in March and she wasn't sure what to do. She presents with limitations in Right shoulder ROM and function, and mild axillary swelling with pectoral tightness on the right. She was instructed in an initial HEP today. She will benefit from skilled PT to address deficits and return to PLOF.    OBJECTIVE IMPAIRMENTS: decreased knowledge of condition, decreased ROM, decreased strength, increased edema, increased fascial restrictions, impaired sensation, and impaired UE functional use.   ACTIVITY LIMITATIONS: sleeping and reach over head  PARTICIPATION LIMITATIONS: cleaning, yard work, and reaching activities  PERSONAL FACTORS: 3+ comorbidities: Right Mastectomy with SLNB s/p chemo and radiation are also affecting patient's functional outcome.   REHAB POTENTIAL: Good  CLINICAL DECISION MAKING: Stable/uncomplicated  EVALUATION COMPLEXITY: Low  GOALS: Goals reviewed with patient?  Yes  SHORT TERM GOALS=LONG TERM GOALS: Target date: 01/22/2024  Pt will be independent and compliant with HEP for right shoulder ROM and strength Baseline: Goal status: INITIAL  2.  Pt will have right shoulder flexion and abd atleast 135 degrees for improved ability to reach cabinets Baseline:  Goal status: INITIAL  3.  Pt will improve quick dash to no greater than 12 % to demonstrate improved function Baseline:  Goal status: INITIAL  4.  Pt will report decreased swelling in axilla by 30-50% Baseline:  Goal status: INITIAL PLAN:  PT FREQUENCY: 1-2x/week, pt prefers to start 1x/week  PT DURATION: 6 weeks  PLANNED INTERVENTIONS: 97164- PT Re-evaluation, 97110-Therapeutic exercises, 97530- Therapeutic activity, 97112- Neuromuscular re-education, 97535- Self Care, [id number]- Manual therapy, and 97760- Orthotic Initial  PLAN FOR NEXT SESSION: supine wand in place of clasped hands, STM right pecs, lateral trunk, UT, PROM, progress to strength when ready PHYSICAL THERAPY DISCHARGE SUMMARY  Visits from Start of Care: 1  Current functional level related to goals / functional outcomes: Unknown. Pt called to cancel appts indicating her arm is better and she is doing her HEP   Remaining deficits: Unknown.   Education / Equipment: HEP   Patient agrees to discharge. Patient goals were not assessed. Patient is being discharged due to being pleased with the current functional level. Per phone call pt called to cancel all appts saying she was doing well and wanted to save visits.  Grayce JINNY Sheldon, PT 12/11/2023, 11:40 AM

## 2023-12-11 ENCOUNTER — Ambulatory Visit: Attending: Hematology

## 2023-12-11 ENCOUNTER — Other Ambulatory Visit: Payer: Self-pay

## 2023-12-11 DIAGNOSIS — C50811 Malignant neoplasm of overlapping sites of right female breast: Secondary | ICD-10-CM | POA: Insufficient documentation

## 2023-12-11 DIAGNOSIS — Z483 Aftercare following surgery for neoplasm: Secondary | ICD-10-CM | POA: Diagnosis not present

## 2023-12-11 DIAGNOSIS — Z171 Estrogen receptor negative status [ER-]: Secondary | ICD-10-CM | POA: Insufficient documentation

## 2023-12-11 DIAGNOSIS — M25611 Stiffness of right shoulder, not elsewhere classified: Secondary | ICD-10-CM | POA: Insufficient documentation

## 2023-12-11 DIAGNOSIS — R229 Localized swelling, mass and lump, unspecified: Secondary | ICD-10-CM | POA: Insufficient documentation

## 2023-12-12 ENCOUNTER — Inpatient Hospital Stay: Attending: Hematology and Oncology | Admitting: Hematology

## 2023-12-12 VITALS — BP 117/73 | HR 80 | Temp 97.8°F | Resp 18 | Wt 181.9 lb

## 2023-12-12 DIAGNOSIS — Z87891 Personal history of nicotine dependence: Secondary | ICD-10-CM | POA: Insufficient documentation

## 2023-12-12 DIAGNOSIS — Z79899 Other long term (current) drug therapy: Secondary | ICD-10-CM | POA: Diagnosis not present

## 2023-12-12 DIAGNOSIS — C50811 Malignant neoplasm of overlapping sites of right female breast: Secondary | ICD-10-CM | POA: Insufficient documentation

## 2023-12-12 DIAGNOSIS — Z809 Family history of malignant neoplasm, unspecified: Secondary | ICD-10-CM | POA: Insufficient documentation

## 2023-12-12 DIAGNOSIS — Z1732 Human epidermal growth factor receptor 2 negative status: Secondary | ICD-10-CM | POA: Insufficient documentation

## 2023-12-12 DIAGNOSIS — Z803 Family history of malignant neoplasm of breast: Secondary | ICD-10-CM | POA: Diagnosis not present

## 2023-12-12 DIAGNOSIS — D696 Thrombocytopenia, unspecified: Secondary | ICD-10-CM | POA: Insufficient documentation

## 2023-12-12 DIAGNOSIS — Z923 Personal history of irradiation: Secondary | ICD-10-CM | POA: Insufficient documentation

## 2023-12-12 DIAGNOSIS — Z808 Family history of malignant neoplasm of other organs or systems: Secondary | ICD-10-CM | POA: Diagnosis not present

## 2023-12-12 DIAGNOSIS — I890 Lymphedema, not elsewhere classified: Secondary | ICD-10-CM | POA: Diagnosis not present

## 2023-12-12 DIAGNOSIS — Z9011 Acquired absence of right breast and nipple: Secondary | ICD-10-CM | POA: Diagnosis not present

## 2023-12-12 DIAGNOSIS — Z9221 Personal history of antineoplastic chemotherapy: Secondary | ICD-10-CM | POA: Insufficient documentation

## 2023-12-12 DIAGNOSIS — Z171 Estrogen receptor negative status [ER-]: Secondary | ICD-10-CM | POA: Diagnosis not present

## 2023-12-12 DIAGNOSIS — D72819 Decreased white blood cell count, unspecified: Secondary | ICD-10-CM | POA: Diagnosis not present

## 2023-12-12 DIAGNOSIS — C773 Secondary and unspecified malignant neoplasm of axilla and upper limb lymph nodes: Secondary | ICD-10-CM | POA: Insufficient documentation

## 2023-12-12 DIAGNOSIS — Z1722 Progesterone receptor negative status: Secondary | ICD-10-CM | POA: Diagnosis not present

## 2023-12-12 NOTE — Progress Notes (Signed)
 [facility] [address], [place] [address]    Clinic Day:  12/12/2023  Referring physician: [name], MD  Patient Care Team: [name], MD as PCP - General (Family Medicine) [name], MD (Family Medicine) Billee Mliss BIRCH, [facility] as Pharmacist (Family Medicine) Eartha Flavors, Toribio, MD as Consulting Physician (Gastroenterology) Mavis Anes, MD as Consulting Physician (General Surgery) Rogers Hai, MD as Medical Oncologist (Medical Oncology) Celestia Joesph SQUIBB, RN as Oncology Nurse Navigator (Medical Oncology) Rogers Hai, MD as Consulting Physician (Hematology) [name], MD as Referring Physician (Family Medicine)   ASSESSMENT & PLAN:   Assessment: 1.  Inflammatory right breast cancer: - Bilateral diagnostic mammogram (02/20/2022): Suspicious right axillary lymphadenopathy.  Indeterminate intramammary lymph node in the right breast at 9:00.  New right breast skin and trabecular thickening, related to vascular congestion from enlarged lymph nodes in the right axilla. - Right axillary lymph node core biopsy (02/20/2022): Morphology and IHC compatible with primary breast cancer with differential diagnosis of urothelial carcinoma and primary lung carcinoma (squamous).  Grade 3.  ER 0%, PR 0%, HER2 2+, Ki-67 60%, HER2 negative by FISH. - Right breast lymph node biopsy at 9:00 (02/20/2022): Negative for carcinoma. - MRI breast (03/02/2022): In the upper outer right breast, mid to posterior depth there is a patchy clumped non-mass enhancement in a linear orientation spanning approximately 3.8 cm.  There is diffuse thickening of the skin in the right breast with skin enhancement.  Left breast with no mass or abnormal enhancement.  Numerous bulky matted lymph nodes in the right axilla. - Right breast UOQ biopsy (03/19/2022): Benign breast with fibrocystic changes including stromal fibrosis, adenosis, usual ductal hyperplasia.   Negative for carcinoma. - PET scan (03/22/2022): Bulky hypermetabolic right axillary and subpectoral lymphadenopathy.  No other definite new sites of metastatic disease.  6 mm right lower lobe lung nodule with no FDG uptake. - Neoadjuvant chemotherapy with weekly paclitaxel  and pembrolizumab  from 04/02/2022 through 06/21/2022,  AC and pembrolizumab  from 07/12/2022 through 09/27/2022 - Right mastectomy and lymph node excision (11/01/2022): YpT0, YPN1A, 2/3 lymph nodes positive for metastatic disease, no ECE.  Surgical margins negative. - Germline mutation testing: Negative - Adjuvant pembrolizumab  and Xeloda  (CREATE-X) started on 12/05/2022, Xeloda  dose reduced to 2 tablets twice daily with cycle 2 due to mucositis and feet hurting with skin peeling.  Completed Xeloda  around 05/23/2023.  Last dose of Keytruda  on 06/03/2023. - XRT to the right chest wall from 06/03/2023 - 07/19/2023 under the direction of Dr. Dewey.    Plan: 1.  Triple negative right breast cancer: - She is wearing a sleeve for right upper extremity lymphedema. - She denies any new onset pains.  She has occasional bitemporal headaches for which she takes Tylenol . - Labs from 11/14/2023: Normal LFTs and creatinine.  CBC with mild leukopenia and thrombocytopenia stable. - Physical exam: Right mastectomy site is within normal limits with no evidence of recurrence.  No palpable adenopathy. - Left breast mammogram (09/10/2023): BI-RADS Category 1. - CA 15-3 and CA 27-29 both have increased to 37 and 51 respectively. - We reviewed PET CT scan from 12/05/2023: No evidence of local or distant recurrence. - Recommend follow-up in 4 months with repeat exam and labs including tumor markers.  She was advised to keep the port until at least end of next year.  She does not report any problems from it.   2.  Hypomagnesemia: - Continue magnesium  once daily.  Magnesium  is normal at 1.7.  3.  Mild leukopenia and thrombocytopenia: - Mild leukopenia and  thrombocytopenia with macrocytosis is stable.  ANC is normal. - Ferritin and iron panel was normal.  B12 and folic acid  was normal.  Will closely monitor.    Orders Placed This Encounter  Procedures   CBC with Differential/Platelet    Standing Status:   Future    Expected Date:   04/12/2024    Expiration Date:   07/11/2024    Release to patient:   Immediate   Comprehensive metabolic panel with GFR    Standing Status:   Future    Expected Date:   04/12/2024    Expiration Date:   07/11/2024    Release to patient:   Immediate   Magnesium     Standing Status:   Future    Expected Date:   04/12/2024    Expiration Date:   07/11/2024    Release to patient:   Immediate   Cancer antigen 15-3    Standing Status:   Future    Expected Date:   04/12/2024    Expiration Date:   07/11/2024   Cancer antigen 27.29    Standing Status:   Future    Expected Date:   04/12/2024    Expiration Date:   07/11/2024      LILLETTE Hummingbird R Teague,acting as a scribe for Marilyn Stands, MD.,have documented all relevant documentation on the behalf of Marilyn Stands, MD,as directed by  Marilyn Stands, MD while in the presence of Marilyn Stands, MD.  I, Marilyn Stands MD, have reviewed the above documentation for accuracy and completeness, and I agree with the above.      Marilyn Stands, MD   8/7/202512:51 PM  CHIEF COMPLAINT:   Diagnosis: locally advanced TNBC    Cancer Staging  Malignant neoplasm of overlapping sites of right female breast [facility]) Staging form: Breast, AJCC 8th Edition - Clinical stage from 03/08/2022: Stage IIIC (cT4d, cN2, cM0, G3, ER-, PR-, HER2-) - Signed by Lanny Callander, MD on 03/08/2022    Prior Therapy: 1. carboplatin  and paclitaxel , 4 cycles, 04/02/22 - 06/06/22 2. Pembrolizumab , 04/02/22 - 09/27/22 3. Adriamycin  and Cytoxan , 4 cycles, 07/12/22 - 09/27/22 4. Right mastectomy, 11/01/22  Current Therapy:  Capecitabine  and pembrolizumab     HISTORY OF PRESENT ILLNESS:    Oncology History Overview Note   Cancer Staging  Malignant neoplasm of overlapping sites of right female breast [facility]) Staging form: Breast, AJCC 8th Edition - Clinical stage from 03/08/2022: Stage IIIC (cT2, cN2, cM0, G3, ER-, PR-, HER2-) - Signed by Lanny Callander, MD on 03/08/2022    Malignant neoplasm of overlapping sites of right female breast [facility])  02/20/2022 Mammogram   CLINICAL DATA:  63 year old female recalled from screening mammography 03/01/2021 for right breast calcifications and subsequent benign discordant biopsy of these calcifications in the central posterior right breast December 2022 with excision recommended. The patient initially followed up with surgery in April however canceled her scheduled surgery and most recently followed up with Dr. Kallie September 2023 with diagnostic imaging, possible RF tag placement and subsequent excision recommended.   EXAM: DIGITAL DIAGNOSTIC BILATERAL MAMMOGRAM WITH TOMOSYNTHESIS; ULTRASOUND RIGHT BREAST LIMITED  MPRESSION: 1.  Suspicious right axillary lymphadenopathy.   2. Indeterminate intramammary lymph node in the right breast at 9 o'clock.   3. 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   4. Decreased calcifications noted at prior benign  biopsy site in the lower central posterior right breast at site of X shaped biopsy marking clip.   02/20/2022 Initial Biopsy   FINAL MICROSCOPIC DIAGNOSIS:   A. AXILLA, RIGHT, LYMPH NODE, NEEDLE CORE BIOPSY:  - Positive for carcinoma (see Comment)   B. LYMPH NODE, RIGHT BREAST, BIOPSY:  - Negative for carcinoma   COMMENT:  Part A: Morphology and immunohistochemical staining are most compatible with primary breast carcinoma with metaplastic changes, however differential diagnosis also includes urothelial carcinoma and less likely primary  lung carcinoma (squamous).  No lymphoid tissue is identified.  Clinical and radiologic correlation is suggested.   ADDENDUM:  In case of a breast origin, the appropriate grade would be grade 3  (3+3+2)   ADDENDUM:  PROGNOSTIC INDICATOR RESULTS:  The tumor cells are EQUIVOCAL for Her2 (2+).  Estrogen Receptor:       0%, NEGATIVE  Progesterone Receptor:   0%, NEGATIVE  Proliferation Marker Ki-67:   60%   ADDENDUM:  FLOURESCENCE IN-SITU HYBRIDIZATION RESULTS:  GROUP 5:   HER2 **NEGATIVE**    03/02/2022 Imaging   EXAM: BILATERAL BREAST MRI WITH AND WITHOUT CONTRAST  IMPRESSION: 1. There is a suspicious 3.8 cm area of patchy non mass enhancement in a linear orientation in the slightly upper outer right breast in the mid to posterior depth spanning 3.8 cm.   2. Diffuse skin thickening with enhancement of the skin, concerning for inflammatory breast cancer.   3. Numerous bulky matted lymph nodes in the right axilla, one of which corresponds with the biopsy-proven metastatic lymph node.   4.  No evidence of left breast malignancy.   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)   03/08/2022 Cancer Staging   Staging form: Breast, AJCC 8th Edition - Clinical stage from 03/08/2022: Stage IIIC (cT4d, cN2, cM0, G3, ER-, PR-, HER2-) - Signed by Lanny Callander, MD on 03/08/2022 Histologic grading system: 3 grade system   03/19/2022 Pathology Results   Diagnosis Breast, right, needle core biopsy, upper outer quadrant, barbell clip BENIGN BREAST WITH FIBROCYSTIC CHANGES INCLUDING STROMAL FIBROSIS, ADENOSIS AND USUAL DUCT HYPERPLASIA BENIGN FIBROMATOID CHANGE NEGATIVE FOR MICROCALCIFICATIONS NEGATIVE FOR CARCINOMA   03/22/2022 PET scan   IMPRESSION: Bulky hypermetabolic right axillary and subpectoral lymphadenopathy, consistent with metastatic disease.   No other definite sites of metastatic disease identified.   6 mm right lower lobe  pulmonary nodule shows no FDG uptake, but is too small to definitively characterize by PET. Recommend continued follow-up by chest CT in 3-4 months.   Aortic Atherosclerosis (ICD10-I70.0).   04/02/2022 - 09/28/2022 Chemotherapy   Patient is on Treatment Plan : BREAST Pembrolizumab  (200) D1 + Carboplatin  (5) D1 + Paclitaxel  (80) D1,8,15 q21d X 4 cycles / Pembrolizumab  (200) D1 + AC D1 q21d x 4 cycles      Genetic Testing   Negative genetic testing. No pathogenic variants identified on the [facility] Common Hereditary Cancers+RNA panel. The report date is 05/31/2022.  The Common Hereditary Cancers Panel + RNA offered by [facility] includes sequencing and/or deletion duplication testing of the following 48 genes: APC*, ATM*, AXIN2, BAP1, BARD1, BMPR1A, BRCA1, BRCA2, BRIP1, CDH1, CDK4, CDKN2A (p14ARF), CDKN2A (p16INK4a), CHEK2, CTNNA1, DICER1*, EPCAM*, FH*, GREM1*, HOXB13, KIT, MBD4, MEN1*, MLH1*, MSH2*, MSH3*, MSH6*, MUTYH, NF1*, NTHL1, PALB2, PDGFRA, PMS2*, POLD1*, POLE, PTEN*, RAD51C, RAD51D, SDHA*, SDHB, SDHC*, SDHD, SMAD4, SMARCA4, STK11, TP53, TSC1*, TSC2, VHL.    12/12/2022 -  Chemotherapy   Patient is on Treatment Plan : BREAST  Pembrolizumab  (200) q21d x 27 weeks        INTERVAL HISTORY:   Cathie is a 63 y.o. female presenting to clinic today for follow up of locally advanced TNBC. She was last seen by me on 11/21/2023.  Since her last visit, she underwent restaging PET on 12/05/2023 that found: Interval mastectomy with expected posttreatment changes at the surgical bed and postradiation changes in subpleural right upper lobe. No suspicious focal hypermetabolic finding to suggest recurrent malignancy, nodal or distant metastasis.  Today, she states that she is doing well overall. Her appetite level is at 100%. Her energy level is at 0%. She is accompanied by her husband.   Shelbia reports fatigue and would like to know what can be done to improve energy. She is currently doing physical therapy and  walking steps.   PAST MEDICAL HISTORY:   Past Medical History: Past Medical History:  Diagnosis Date   Allergy    Anxiety    Arthritis    Blood transfusion without reported diagnosis    Breast cancer (HCC) 10/2022   right breast   Diabetes mellitus without complication (HCC)    GERD (gastroesophageal reflux disease)    Heart murmur    as a child   History of kidney stones    Hypertension    Pneumonia    Stroke [facility])    Vaginal Pap smear, abnormal     Surgical History: Past Surgical History:  Procedure Laterality Date   BREAST BIOPSY  10/31/2022   US  RT RADIOACTIVE SEED LOC 10/31/2022 [facility] MAMMOGRAPHY   CESAREAN SECTION     CHOLECYSTECTOMY     COLONOSCOPY WITH PROPOFOL  N/A 07/19/2020   Procedure: COLONOSCOPY WITH PROPOFOL ;  Surgeon: Eartha Angelia Sieving, MD;  Location: [facility];  Service: Gastroenterology;  Laterality: N/A;  AM   MASTECTOMY Right 2024   POLYPECTOMY  07/19/2020   Procedure: POLYPECTOMY;  Surgeon: Eartha Angelia Sieving, MD;  Location: [facility];  Service: Gastroenterology;;   PORTACATH PLACEMENT N/A 03/20/2022   Procedure: INSERTION PORT-A-CATH;  Surgeon: Vernetta Berg, MD;  Location: [facility];  Service: General;  Laterality: N/A;   RADIOACTIVE SEED GUIDED AXILLARY SENTINEL LYMPH NODE Right 11/01/2022   Procedure: RADIOACTIVE SEED GUIDED RIGHT AXILLARY SENTINEL LYMPH NODE DISSECTION;  Surgeon: Vernetta Berg, MD;  Location: [facility];  Service: General;  Laterality: Right;   SIMPLE MASTECTOMY WITH AXILLARY SENTINEL NODE BIOPSY Right 11/01/2022   Procedure: RIGHT SIMPLE MASTECTOMY;  Surgeon: Vernetta Berg, MD;  Location: [facility];  Service: General;  Laterality: Right;    Social History: Social History   Socioeconomic History   Marital status: Married    Spouse name: Lynwood   Number of children: 2   Years of education: 10   Highest education level: Never attended school  Occupational History    Occupation: disabled  Tobacco Use   Smoking status: Former    Current packs/day: 0.00    Average packs/day: 0.5 packs/day for 25.0 years (12.5 ttl pk-yrs)    Types: Cigarettes    Start date: 12/08/1999    Quit date: 03/12/2015    Years since quitting: 8.7   Smokeless tobacco: Never  Vaping Use   Vaping status: Never Used  Substance and Sexual Activity   Alcohol use: Yes    Alcohol/week: 2.0 - 3.0 standard drinks of alcohol    Types: 2 - 3 Glasses of wine per week   Drug use: No   Sexual activity: Not Currently  Birth control/protection: Abstinence, Post-menopausal  Other Topics Concern   Not on file  Social History Narrative   Volunteers at [employer] for a few hours every day.    She really enjoys getting out of of the house and working there.    Social Drivers of Corporate investment banker Strain: Low Risk  (11/28/2023)   Overall Financial Resource Strain (CARDIA)    Difficulty of Paying Living Expenses: Not very hard  Food Insecurity: No Food Insecurity (11/28/2023)   Hunger Vital Sign    Worried About Running Out of Food in the Last Year: Never true    Ran Out of Food in the Last Year: Never true  Transportation Needs: No Transportation Needs (11/28/2023)   PRAPARE - Administrator, Civil Service (Medical): No    Lack of Transportation (Non-Medical): No  Physical Activity: Insufficiently Active (11/28/2023)   Exercise Vital Sign    Days of Exercise per Week: 5 days    Minutes of Exercise per Session: 20 min  Stress: Stress Concern Present (11/28/2023)   [employer] of Occupational Health - Occupational Stress Questionnaire    Feeling of Stress: To some extent  Social Connections: Moderately Integrated (11/28/2023)   Social Connection and Isolation Panel    Frequency of Communication with Friends and Family: More than three times a week    Frequency of Social Gatherings with Friends and Family: Three times a week    Attends Religious  Services: Patient declined    Active Member of Clubs or Organizations: Yes    Attends Banker Meetings: More than 4 times per year    Marital Status: Married  Recent Concern: Social Connections - Moderately Isolated (09/09/2023)   Social Connection and Isolation Panel    Frequency of Communication with Friends and Family: More than three times a week    Frequency of Social Gatherings with Friends and Family: Twice a week    Attends Religious Services: Never    Database administrator or Organizations: No    Attends Banker Meetings: Never    Marital Status: Married  Catering manager Violence: Not At Risk (09/09/2023)   Humiliation, Afraid, Rape, and Kick questionnaire    Fear of Current or Ex-Partner: No    Emotionally Abused: No    Physically Abused: No    Sexually Abused: No    Family History: Family History  Problem Relation Age of Onset   Diabetes Mother    Uterine cancer Mother 19 - 67   Diabetes Brother    Breast cancer Maternal Aunt        dx >50, d. from cancer   Cancer Maternal Grandmother        unk type, back cancer?    Current Medications:  Current Outpatient Medications:    Alcohol Swabs (B-D SINGLE USE SWABS REGULAR) PADS, Test BS daily and as needed Dx E11.9, Disp: 100 each, Rfl: 3   aluminum -magnesium  hydroxide-simethicone  (MAALOX) 200-200-20 MG/5ML SUSP, Take 30 mLs by mouth 4 (four) times daily -  before meals and at bedtime., Disp: 480 mL, Rfl: 2   amLODipine  (NORVASC ) 10 MG tablet, Take 1 tablet (10 mg total) by mouth daily., Disp: 90 tablet, Rfl: 3   Apple Cider Vinegar 500 MG TABS, Take 500 mg by mouth in the morning., Disp: , Rfl:    aspirin  81 MG EC tablet, Take 1 tablet (81 mg total) by mouth daily., Disp: 30 tablet, Rfl: 0   atorvastatin  (  LIPITOR ) 80 MG tablet, TAKE 1 TABLET EVERY DAY AT 6PM, Disp: 90 tablet, Rfl: 3   betamethasone  dipropionate 0.05 % cream, Apply topically 2 (two) times daily., Disp: 30 g, Rfl: 0   Blood  Glucose Calibration (TRUE METRIX LEVEL 1) Low SOLN, Use with glucometer Dx E11.9, Disp: 3 each, Rfl: 0   Blood Glucose Monitoring Suppl (TRUE METRIX AIR GLUCOSE METER) w/Device KIT, Test BS daily and as needed Dx E11.9, Disp: 1 kit, Rfl: 0   Calcium  Carb-Cholecalciferol (CALCIUM -VITAMIN D) 600-400 MG-UNIT TABS, Take 1 tablet by mouth in the morning., Disp: , Rfl:    Coenzyme Q10 (COQ10) 100 MG CAPS, Take 100 mg by mouth in the morning., Disp: , Rfl:    Cranberry 425 MG CAPS, Take 425 mg by mouth in the morning and at bedtime., Disp: , Rfl:    diphenoxylate -atropine  (LOMOTIL ) 2.5-0.025 MG tablet, Take 1 tablet by mouth 4 (four) times daily as needed for diarrhea or loose stools., Disp: 30 tablet, Rfl: 0   Flaxseed, Linseed, (FLAXSEED OIL) 1000 MG CAPS, Take 1,000 mg by mouth in the morning., Disp: , Rfl:    gabapentin  (NEURONTIN ) 300 MG capsule, TAKE 1 CAPSULE BY MOUTH TWICE A DAY, Disp: 60 capsule, Rfl: 3   glucose blood (TRUE METRIX BLOOD GLUCOSE TEST) test strip, Test BS daily and as needed Dx E11.9, Disp: 100 each, Rfl: 3   icosapent  Ethyl (VASCEPA ) 1 g capsule, Take 2 g by mouth 2 (two) times daily., Disp: , Rfl:    Krill Oil 500 MG CAPS, Take 3 capsules (1,500 mg total) by mouth in the morning and at bedtime., Disp: , Rfl:    lidocaine  (XYLOCAINE ) 2 % solution, Use as directed 15 mLs in the mouth or throat every 6 (six) hours as needed for mouth pain., Disp: 250 mL, Rfl: 1   lidocaine -prilocaine  (EMLA ) cream, Apply 1 Application topically as needed., Disp: 30 g, Rfl: 0   magnesium  oxide (MAG-OX) 400 (240 Mg) MG tablet, TAKE 1 TABLET BY MOUTH TWICE A DAY, Disp: 60 tablet, Rfl: 1   meloxicam  (MOBIC ) 15 MG tablet, TAKE 1 TABLET (15 MG TOTAL) BY MOUTH DAILY FOR JOINT AND MUSCLE PAIN, Disp: 90 tablet, Rfl: 0   metoprolol  (TOPROL -XL) 200 MG 24 hr tablet, TAKE 1 TABLET ONE TIME DAILY, WITH OR IMMEDIATELY FOLLOWING A MEAL, Disp: 90 tablet, Rfl: 3   Multiple Vitamin (MULTIVITAMIN) capsule, Take 1 capsule  by mouth in the morning., Disp: , Rfl:    ondansetron  (ZOFRAN ) 8 MG tablet, Take 1 tablet (8 mg total) by mouth every 8 (eight) hours as needed for nausea or vomiting., Disp: 30 tablet, Rfl: 3   pantoprazole  (PROTONIX ) 40 MG tablet, TAKE 1 TABLET twice daily FOR STOMACH, Disp: 180 tablet, Rfl: 3   traZODone  (DESYREL ) 150 MG tablet, TAKE 1 OR 2 TABLETS AT BEDTIME FOR SLEEP, Disp: 180 tablet, Rfl: 0   TRUEplus Lancets 33G MISC, Test BS daily and as needed Dx E11.9, Disp: 100 each, Rfl: 3   valsartan  (DIOVAN ) 320 MG tablet, Take 1 tablet (320 mg total) by mouth daily. For blood pressure., Disp: 90 tablet, Rfl: 3   zinc  gluconate 50 MG tablet, Take 50 mg by mouth daily., Disp: , Rfl:    Zinc  Oxide 20 % PSTE, Apply 1 Application topically 3 (three) times daily., Disp: 57 g, Rfl: 1   Allergies: Allergies  Allergen Reactions   Penicillins Shortness Of Breath   Sulfa Antibiotics Rash    REVIEW OF SYSTEMS:  Review of Systems  Constitutional:  Positive for fatigue. Negative for chills and fever.  HENT:   Negative for lump/mass, mouth sores, nosebleeds, sore throat and trouble swallowing.   Eyes:  Negative for eye problems.  Respiratory:  Negative for cough and shortness of breath.   Cardiovascular:  Positive for leg swelling (left). Negative for chest pain and palpitations.  Gastrointestinal:  Positive for constipation. Negative for abdominal pain, diarrhea, nausea and vomiting.  Genitourinary:  Negative for bladder incontinence, difficulty urinating, dysuria, frequency, hematuria and nocturia.   Musculoskeletal:  Negative for arthralgias, back pain, flank pain, myalgias and neck pain.  Skin:  Negative for itching and rash.  Neurological:  Negative for dizziness, headaches and numbness.  Hematological:  Does not bruise/bleed easily.  Psychiatric/Behavioral:  Negative for depression, sleep disturbance and suicidal ideas. The patient is not nervous/anxious.   All other systems reviewed and are  negative.    VITALS:   Blood pressure 117/73, pulse 80, temperature 97.8 F (36.6 C), temperature source Oral, resp. rate 18, weight 181 lb 14.1 oz (82.5 kg), SpO2 96%.  Wt Readings from Last 3 Encounters:  12/12/23 181 lb 14.1 oz (82.5 kg)  12/02/23 177 lb 8 oz (80.5 kg)  11/28/23 175 lb (79.4 kg)    Body mass index is 33.27 kg/m.  Performance status (ECOG): 1 - Symptomatic but completely ambulatory  PHYSICAL EXAM:   Physical Exam Vitals and nursing note reviewed. Exam conducted with a chaperone present.  Constitutional:      Appearance: Normal appearance.  Cardiovascular:     Rate and Rhythm: Normal rate and regular rhythm.     Pulses: Normal pulses.     Heart sounds: Normal heart sounds.  Pulmonary:     Effort: Pulmonary effort is normal.     Breath sounds: Normal breath sounds.  Abdominal:     Palpations: Abdomen is soft. There is no hepatomegaly, splenomegaly or mass.     Tenderness: There is no abdominal tenderness.  Musculoskeletal:     Right lower leg: No edema.     Left lower leg: No edema.  Lymphadenopathy:     Cervical: No cervical adenopathy.     Right cervical: No superficial, deep or posterior cervical adenopathy.    Left cervical: No superficial, deep or posterior cervical adenopathy.     Upper Body:     Right upper body: No supraclavicular or axillary adenopathy.     Left upper body: No supraclavicular or axillary adenopathy.  Neurological:     General: No focal deficit present.     Mental Status: She is alert and oriented to person, place, and time.  Psychiatric:        Mood and Affect: Mood normal.        Behavior: Behavior normal.   Breast Exam Chaperone: Isaiah Piety, RN   LABS:   CBC     Component Value Date/Time   WBC 3.9 (L) 11/14/2023 1350   RBC 3.18 (L) 11/14/2023 1350   HGB 11.2 (L) 11/14/2023 1350   HGB 12.0 09/18/2023 1101   HCT 32.4 (L) 11/14/2023 1350   HCT 36.5 09/18/2023 1101   PLT 132 (L) 11/14/2023 1350   PLT 137  (L) 09/18/2023 1101   MCV 101.9 (H) 11/14/2023 1350   MCV 106 (H) 09/18/2023 1101   MCH 35.2 (H) 11/14/2023 1350   MCHC 34.6 11/14/2023 1350   RDW 13.9 11/14/2023 1350   RDW 14.2 09/18/2023 1101   LYMPHSABS 0.6 (L) 11/14/2023 1350   LYMPHSABS  0.5 (L) 09/18/2023 1101   MONOABS 0.5 11/14/2023 1350   EOSABS 0.1 11/14/2023 1350   EOSABS 0.1 09/18/2023 1101   BASOSABS 0.0 11/14/2023 1350   BASOSABS 0.0 09/18/2023 1101    CMP      Component Value Date/Time   NA 137 11/14/2023 1350   NA 140 09/18/2023 1101   K 4.2 11/14/2023 1350   CL 99 11/14/2023 1350   CO2 25 11/14/2023 1350   GLUCOSE 176 (H) 11/14/2023 1350   BUN 17 11/14/2023 1350   BUN 18 09/18/2023 1101   CREATININE 0.90 11/14/2023 1350   CREATININE 0.97 04/02/2022 0924   CALCIUM  9.2 11/14/2023 1350   PROT 7.0 11/14/2023 1350   PROT 6.9 09/18/2023 1101   ALBUMIN 3.7 11/14/2023 1350   ALBUMIN 4.3 09/18/2023 1101   AST 34 11/14/2023 1350   AST 23 04/02/2022 0924   ALT 38 11/14/2023 1350   ALT 26 04/02/2022 0924   ALKPHOS 99 11/14/2023 1350   BILITOT 0.8 11/14/2023 1350   BILITOT 0.7 09/18/2023 1101   BILITOT 0.6 04/02/2022 0924   GFRNONAA >60 11/14/2023 1350   GFRNONAA >60 04/02/2022 0924   GFRAA 66 05/02/2020 1024     No results found for: CEA1, CEA / No results found for: CEA1, CEA No results found for: PSA1 No results found for: CAN199 No results found for: CAN125  No results found for: TOTALPROTELP, ALBUMINELP, A1GS, A2GS, BETS, BETA2SER, GAMS, MSPIKE, SPEI Lab Results  Component Value Date   TIBC 300 11/14/2023   TIBC 350 02/14/2023   FERRITIN 537 (H) 11/14/2023   FERRITIN 599 (H) 02/14/2023   IRONPCTSAT 30 11/14/2023   IRONPCTSAT 39 (H) 02/14/2023   No results found for: LDH   STUDIES:   NM PET Image Restage (PS) Skull Base to Thigh (F-18 FDG) Result Date: 12/11/2023 CLINICAL DATA:  Subsequent treatment strategy for right breast cancer status post mastectomy and  axillary nodal dissection . EXAM: NUCLEAR MEDICINE PET SKULL BASE TO THIGH TECHNIQUE: 8.74 mCi F-18 FDG was injected intravenously. Full-ring PET imaging was performed from the skull base to thigh after the radiotracer. CT data was obtained and used for attenuation correction and anatomic localization. Fasting blood glucose: 153 mg/dl COMPARISON:  PET-CT March 22, 2022 6. FINDINGS: Mediastinal blood pool activity: SUV max 3 Liver activity: SUV max 3.5 NECK: No suspicious hypermetabolic lymphadenopathy Incidental CT findings: Heterogeneous nodular thyroid  without abnormal uptake. CHEST: Interval right mastectomy without suspicious focal metabolic activity to suggest recurrent malignancy. Mild diffuse chest wall activity likely posttreatment changes max SUV 2.9 (slice 75) left wrist is unremarkable. Surgical clips in right subpectoral and axilla. No hypermetabolic axillary lymphadenopathy. Few subcentimeter left axillary lymph nodes likely reactive. Postradiation changes identified in subpleural right upper lobe with mild FDG uptake max SUV 3.1. No new or suspicious pulmonary nodule. Partially calcified right lower lobe nodules suggestive of prior granulomatous disease. No pleural effusion. Incidental CT findings: Left-sided porta catheter tip terminates in cavoatrial junction. Atherosclerotic calcifications of coronary arteries. The heart size is borderline normal to mildly enlarged. No pericardial fluid. ABDOMEN/PELVIS: No abnormal hypermetabolic activity within the liver, pancreas, adrenal glands, or spleen. No hypermetabolic lymph nodes in the abdomen or pelvis. Incidental CT findings: Cholecystectomy. Hepatic steatosis. Mild atrophic changes of the pancreas. Colonic diverticulosis without diverticulitis. Calcified uterine fibroid similar to prior. No adnexal mass. Stable left renal cyst without FDG uptake. SKELETON: No suspicious hypermetabolic osseous lesion. Mild nonspecific metabolic activity of the left  humeral proximal bone marrow without definite  CT correlate, similar to prior. Incidental CT findings: None. IMPRESSION: Interval mastectomy with expected posttreatment changes at the surgical bed and postradiation changes in subpleural right upper lobe. No suspicious focal hypermetabolic finding to suggest recurrent malignancy, nodal or distant metastasis. Electronically Signed   By: Megan  Zare M.D.   On: 12/11/2023 15:47

## 2023-12-12 NOTE — Patient Instructions (Addendum)
 [facility]  Discharge Instructions  You were seen and examined today by Dr. Rogers.  Dr. Rogers discussed your most recent lab work and CT scan which revealed that everything looks good and stable.  Dr. Katragadda will repeat labs in 4 months.  Follow-up as scheduled.    Thank you for choosing [facility] to provide your oncology and hematology care.   To afford each patient quality time with our provider, please arrive at least 15 minutes before your scheduled appointment time. You may need to reschedule your appointment if you arrive late (10 or more minutes). Arriving late affects you and other patients whose appointments are after yours.  Also, if you miss three or more appointments without notifying the office, you may be dismissed from the clinic at the provider's discretion.    Again, thank you for choosing [facility].  Our hope is that these requests will decrease the amount of time that you wait before being seen by our physicians.   If you have a lab appointment with the Cancer Center - please note that after April 8th, all labs will be drawn in the cancer center.  You do not have to check in or register with the main entrance as you have in the past but will complete your check-in at the cancer center.            _____________________________________________________________  Should you have questions after your visit to [facility], please contact our office at [phone] and follow the prompts.  Our office hours are 8:00 a.m. to 4:30 p.m. Monday - Thursday and 8:00 a.m. to 2:30 p.m. Friday.  Please note that voicemails left after 4:00 p.m. may not be returned until the following business day.  We are closed weekends and all major holidays.  You do have access to a nurse 24-7, just call the main number to the clinic [phone] and do not press any options, hold on the line and a nurse  will answer the phone.    For prescription refill requests, have your pharmacy contact our office and allow 72 hours.    Masks are no longer required in the cancer centers. If you would like for your care team to wear a mask while they are taking care of you, please let them know. You may have one support person who is at least 63 years old accompany you for your appointments.

## 2023-12-19 ENCOUNTER — Encounter: Payer: Self-pay | Admitting: Family Medicine

## 2023-12-19 ENCOUNTER — Other Ambulatory Visit (HOSPITAL_COMMUNITY): Payer: Self-pay

## 2023-12-19 ENCOUNTER — Ambulatory Visit: Admitting: Family Medicine

## 2023-12-19 VITALS — BP 130/74 | HR 75 | Temp 97.3°F | Ht 62.0 in | Wt 177.2 lb

## 2023-12-19 DIAGNOSIS — I10 Essential (primary) hypertension: Secondary | ICD-10-CM | POA: Diagnosis not present

## 2023-12-19 DIAGNOSIS — Z23 Encounter for immunization: Secondary | ICD-10-CM | POA: Diagnosis not present

## 2023-12-19 DIAGNOSIS — E782 Mixed hyperlipidemia: Secondary | ICD-10-CM | POA: Diagnosis not present

## 2023-12-19 DIAGNOSIS — E1165 Type 2 diabetes mellitus with hyperglycemia: Secondary | ICD-10-CM | POA: Diagnosis not present

## 2023-12-19 DIAGNOSIS — G4701 Insomnia due to medical condition: Secondary | ICD-10-CM

## 2023-12-19 LAB — BAYER DCA HB A1C WAIVED: HB A1C (BAYER DCA - WAIVED): 6.3 % — ABNORMAL HIGH (ref 4.8–5.6)

## 2023-12-19 MED ORDER — TRAZODONE HCL 150 MG PO TABS: ORAL_TABLET | ORAL | 3 refills | Status: AC

## 2023-12-19 MED ORDER — PREGABALIN 50 MG PO CAPS
ORAL_CAPSULE | ORAL | 1 refills | Status: DC
Start: 2023-12-19 — End: 2024-03-23

## 2023-12-19 MED ORDER — VALSARTAN 320 MG PO TABS: 320.0000 mg | ORAL_TABLET | Freq: Every day | ORAL | 3 refills | Status: AC

## 2023-12-19 NOTE — Progress Notes (Addendum)
 Subjective:  Patient ID: Marilyn Ford, female    DOB: 1960/12/22  Age: 63 y.o. MRN: [id number]  CC: 3 MONTH FOLLOW UP   HPI  Discussed the use of AI scribe software for clinical note transcription with the patient, who gave verbal consent to proceed.  History of Present Illness   Marilyn Ford is a 63 year old female with a history of breast cancer and stroke who presents for a follow-up visit.  She has a history of breast cancer and recently had a clear PET scan. She recently completed a mammogram. An ultrasound was also part of her surveillance.  She has diabetes with a recent A1c of 6.3%. Her recent blood pressure reading was 130/74 mmHg. She regularly checks her blood pressure and reports that her readings have been good.  She experienced a stroke in 2016, after which she quit smoking. She has not smoked since. She has neuropathy in her feet, for which she takes gabapentin  once daily, but she dislikes the side effects and is interested in trying a different medication.  She has gained weight after recovering from a period of not being able to eat. She attributes part of her weight gain to an artificial limb.  Socially, she is retired but previously worked part-time after her stroke. 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      *ERROR Pt. Does NOT have an artificial limb       12/19/2023    8:43 AM 12/12/2023   11:48 AM 11/28/2023    1:01 PM  Depression screen PHQ 2/9  Decreased Interest 1 0 2  Down, Depressed, Hopeless 0 0 2  PHQ - 2 Score 1 0 4  Altered sleeping 1 0 2  Tired, decreased energy 1 0 2  Change in appetite 1 0 1  Feeling bad or failure about yourself  0 0 0  Trouble concentrating 1 0 1  Moving slowly or fidgety/restless 0 0 1  Suicidal thoughts 0 0 0  PHQ-9 Score 5 0 11  Difficult doing work/chores Somewhat difficult  Somewhat  difficult    History Marilyn Ford has a past medical history of Allergy, Anxiety, Arthritis, Blood transfusion without reported diagnosis, Breast cancer (HCC) (10/2022), Diabetes mellitus without complication (HCC), GERD (gastroesophageal reflux disease), Heart murmur, History of kidney stones, Hypertension, Pneumonia, Stroke (HCC), and Vaginal Pap smear, abnormal.   She has a past surgical history that includes Cholecystectomy; Cesarean section; Colonoscopy with propofol  (N/A, 07/19/2020); polypectomy (07/19/2020); Portacath placement (N/A, 03/20/2022); Breast biopsy (10/31/2022); Simple mastectomy with axillary sentinel node biopsy (Right, 11/01/2022); Radioactive seed guided axillary sentinel lymph node (Right, 11/01/2022); and Mastectomy (Right, 2024).   Her family history includes Breast cancer in her maternal aunt; Cancer in her maternal grandmother; Diabetes in her brother and mother; Uterine cancer (age of onset: 104 - 16) in her mother.She reports that she quit smoking about 8 years ago. Her smoking use included cigarettes. She started smoking about 24 years ago. She has a 12.5 pack-year smoking history. She has never used smokeless tobacco. She reports current alcohol use of about 2.0 - 3.0 standard drinks of alcohol per week. She reports that she does not use drugs.    ROS Review of Systems  Constitutional: Negative.   HENT:  Negative for congestion.   Eyes:  Negative for visual disturbance.  Respiratory:  Negative for shortness  of breath.   Cardiovascular:  Negative for chest pain.  Gastrointestinal:  Negative for abdominal pain, constipation, diarrhea, nausea and vomiting.  Genitourinary:  Negative for difficulty urinating.  Musculoskeletal:  Negative for arthralgias and myalgias.  Neurological:  Negative for headaches.  Psychiatric/Behavioral:  Negative for sleep disturbance.     Objective:  BP 130/74   Pulse 75   Temp (!) 97.3 F (36.3 C)   Ht 5' 2 (1.575 m)   Wt 177 lb 3.2 oz  (80.4 kg)   SpO2 96%   BMI 32.41 kg/m   BP Readings from Last 3 Encounters:  12/19/23 130/74  12/12/23 117/73  11/28/23 124/73    Wt Readings from Last 3 Encounters:  12/19/23 177 lb 3.2 oz (80.4 kg)  12/12/23 181 lb 14.1 oz (82.5 kg)  12/02/23 177 lb 8 oz (80.5 kg)     Physical Exam Constitutional:      General: She is not in acute distress.    Appearance: She is well-developed.  Cardiovascular:     Rate and Rhythm: Normal rate and regular rhythm.  Pulmonary:     Breath sounds: Normal breath sounds.  Musculoskeletal:        General: Normal range of motion.  Skin:    General: Skin is warm and dry.  Neurological:     Mental Status: She is alert and oriented to person, place, and time.      Assessment & Plan:  Type 2 diabetes mellitus with hyperglycemia, without long-term current use of insulin  (HCC) -     Bayer DCA Hb A1c Waived -     CBC with Differential/Platelet  Essential hypertension -     CBC with Differential/Platelet -     CMP14+EGFR  Mixed hyperlipidemia -     Lipid panel  Insomnia due to medical condition -     traZODone  HCl; TAKE 1 OR 2 TABLETS AT BEDTIME FOR SLEEP  Dispense: 180 tablet; Refill: 3  Need for vaccination against Streptococcus pneumoniae -     Pneumococcal conjugate vaccine 20-valent  Other orders -     Valsartan ; Take 1 tablet (320 mg total) by mouth daily. For blood pressure.  Dispense: 90 tablet; Refill: 3 -     Pregabalin ; 1 qhs X7 days , then 2 qhs X 7d, then 3 qhs X 7d, then 4 qhs  Dispense: 120 capsule; Refill: 1    Assessment and Plan    Adult Wellness Visit Emphasized vaccinations due to cancer history and potential immune suppression. Highlighted risk of serious infections like pneumonia and disseminated herpes zoster. Encouraged regular health screenings, including mammograms, due to breast cancer history. - Administer pneumonia vaccine today.  Peripheral neuropathy Peripheral neuropathy causing discomfort in feet.  Transitioning from gabapentin  to pregabalin  for better tolerance. - Continue gabapentin  1 tablet daily for one month while titrating pregabalin . - Initiate pregabalin : 1 tablet daily for the first week, 2 tablets daily for the second week, 3 tablets daily for the third week, 4 tablets daily for the fourth week. - Discontinue gabapentin  after reaching 4 tablets of pregabalin  daily. - Schedule follow-up appointment in 4-5 weeks.  Type 2 diabetes mellitus Well-controlled with an A1c of 6.3%.  Essential hypertension Blood pressure well-controlled at 130/74 mmHg with consistent good readings at home.  Obesity Weight increase noted as a sign of recovery post-cancer treatment, but management is necessary to prevent long-term health issues. - Encourage dietary modifications and increased physical activity.          Follow-up:  Return in about 5 weeks (around 01/23/2024).  Butler Der, M.D.

## 2023-12-19 NOTE — Addendum Note (Signed)
 Addended by: JODENE CORNERS B on: 12/19/2023 09:51 AM   Modules accepted: Orders

## 2023-12-20 ENCOUNTER — Encounter: Payer: Self-pay | Admitting: Oncology

## 2023-12-20 ENCOUNTER — Other Ambulatory Visit (HOSPITAL_COMMUNITY): Payer: Self-pay

## 2023-12-20 ENCOUNTER — Telehealth: Payer: Self-pay

## 2023-12-20 LAB — CMP14+EGFR
ALT: 51 IU/L — ABNORMAL HIGH (ref 0–32)
AST: 33 IU/L (ref 0–40)
Albumin: 4.4 g/dL (ref 3.9–4.9)
Alkaline Phosphatase: 113 IU/L (ref 44–121)
BUN/Creatinine Ratio: 17 (ref 12–28)
BUN: 18 mg/dL (ref 8–27)
Bilirubin Total: 0.6 mg/dL (ref 0.0–1.2)
CO2: 20 mmol/L (ref 20–29)
Calcium: 9.5 mg/dL (ref 8.7–10.3)
Chloride: 101 mmol/L (ref 96–106)
Creatinine, Ser: 1.05 mg/dL — ABNORMAL HIGH (ref 0.57–1.00)
Globulin, Total: 2.6 g/dL (ref 1.5–4.5)
Glucose: 211 mg/dL — ABNORMAL HIGH (ref 70–99)
Potassium: 4.6 mmol/L (ref 3.5–5.2)
Sodium: 139 mmol/L (ref 134–144)
Total Protein: 7 g/dL (ref 6.0–8.5)
eGFR: 60 mL/min/1.73 (ref >59)

## 2023-12-20 LAB — CBC WITH DIFFERENTIAL/PLATELET
Basophils Absolute: 0 x10E3/uL (ref 0.0–0.2)
Basos: 1 %
EOS (ABSOLUTE): 0.1 x10E3/uL (ref 0.0–0.4)
Eos: 3 %
Hematocrit: 36.1 % (ref 34.0–46.6)
Hemoglobin: 11.8 g/dL (ref 11.1–15.9)
Immature Grans (Abs): 0 x10E3/uL (ref 0.0–0.1)
Immature Granulocytes: 0 %
Lymphocytes Absolute: 0.5 x10E3/uL — ABNORMAL LOW (ref 0.7–3.1)
Lymphs: 14 %
MCH: 34.5 pg — ABNORMAL HIGH (ref 26.6–33.0)
MCHC: 32.7 g/dL (ref 31.5–35.7)
MCV: 106 fL — ABNORMAL HIGH (ref 79–97)
Monocytes Absolute: 0.3 x10E3/uL (ref 0.1–0.9)
Monocytes: 9 %
Neutrophils Absolute: 2.6 x10E3/uL (ref 1.4–7.0)
Neutrophils: 73 %
Platelets: 140 x10E3/uL — ABNORMAL LOW (ref 150–450)
RBC: 3.42 x10E6/uL — ABNORMAL LOW (ref 3.77–5.28)
RDW: 14.2 % (ref 11.7–15.4)
WBC: 3.5 x10E3/uL (ref 3.4–10.8)

## 2023-12-20 LAB — LIPID PANEL
Chol/HDL Ratio: 4.4 ratio (ref 0.0–4.4)
Cholesterol, Total: 163 mg/dL (ref 100–199)
HDL: 37 mg/dL — ABNORMAL LOW (ref >39)
LDL Chol Calc (NIH): 64 mg/dL (ref 0–99)
Triglycerides: 402 mg/dL — ABNORMAL HIGH (ref 0–149)
VLDL Cholesterol Cal: 62 mg/dL — ABNORMAL HIGH (ref 5–40)

## 2023-12-20 NOTE — Telephone Encounter (Signed)
 Pharmacy Patient Advocate Encounter   Received notification from CoverMyMeds that prior authorization for Pregabalin  50MG  capsules  is required/requested.   Insurance verification completed.   The patient is insured through [facility] .   Per test claim: PA required; PA submitted to above mentioned insurance via Latent Key/confirmation #/EOC [facility] Status is pending

## 2023-12-23 ENCOUNTER — Other Ambulatory Visit (HOSPITAL_COMMUNITY): Payer: Self-pay

## 2023-12-23 ENCOUNTER — Ambulatory Visit: Payer: Self-pay | Admitting: Family Medicine

## 2023-12-23 NOTE — Telephone Encounter (Signed)
 Pharmacy Patient Advocate Encounter  Received notification from [facility] that Prior Authorization for Pregabalin  50MG  capsules  has been APPROVED from 09/21/2023 to 12/19/2024   PA #/Case ID/Reference #: [id number]

## 2023-12-25 ENCOUNTER — Ambulatory Visit

## 2024-01-01 DIAGNOSIS — Z8589 Personal history of malignant neoplasm of other organs and systems: Secondary | ICD-10-CM | POA: Diagnosis not present

## 2024-01-01 DIAGNOSIS — Z853 Personal history of malignant neoplasm of breast: Secondary | ICD-10-CM | POA: Diagnosis not present

## 2024-01-01 DIAGNOSIS — Z008 Encounter for other general examination: Secondary | ICD-10-CM | POA: Diagnosis not present

## 2024-01-02 ENCOUNTER — Encounter

## 2024-01-02 DIAGNOSIS — C50911 Malignant neoplasm of unspecified site of right female breast: Secondary | ICD-10-CM | POA: Diagnosis not present

## 2024-01-03 ENCOUNTER — Ambulatory Visit: Payer: Self-pay

## 2024-01-03 NOTE — Telephone Encounter (Signed)
Noted and appt made

## 2024-01-03 NOTE — Telephone Encounter (Signed)
 FYI Only or Action Required?: FYI only for provider.  Patient was last seen in primary care on 12/19/2023 by [name], MD.  Called Nurse Triage reporting No chief complaint on file..  Symptoms began several days ago.  Interventions attempted: Rest, hydration, or home remedies.  Symptoms are: gradually worsening.  Triage Disposition: Home Care  Patient/caregiver understands and will follow disposition?: Yes  Copied from CRM [id number]. Topic: Clinical - Red Word Triage >> Jan 03, 2024  1:53 PM [name] wrote: Red Word that prompted transfer to Nurse Triage: dizzy spells for about three days, BP earlier this week was 157-90 and sugar was 300. [name] hurts as well. Reason for Disposition  [1] Blood glucose 240 - 300 mg/dL (13.3 - 16.7 mmol/L) AND [2] does not use insulin  (e.g., not insulin -dependent; most people with type 2 diabetes)  Answer Assessment - Initial Assessment Questions 1. BLOOD GLUCOSE: What is your blood glucose level?      300  2. ONSET: When did you check the blood glucose?      Tuesday Morning,   3. USUAL RANGE: What is your glucose level usually? (e.g., usual fasting morning value, usual evening value)     157 usually  4. KETONES: Do you check for ketones (urine or blood test strips)? If Yes, ask: What does the test show now?      Has not checked  5. TYPE 1 or 2:  Do you know what type of diabetes you have?  (e.g., Type 1, Type 2, Gestational; doesn't know)      Type 2 Diabetic  6. INSULIN : Do you take insulin ? What type of insulin (s) do you use? What is the mode of delivery? (syringe, pen; injection or pump)?      No  7. DIABETES PILLS: Do you take any pills for your diabetes? If Yes, ask: Have you missed taking any pills recently?     No  8. OTHER SYMPTOMS: Do you have any symptoms? (e.g., fever, frequent urination, difficulty breathing, dizziness, weakness, vomiting)     Dizziness  9. PREGNANCY: Is there any chance you are  pregnant? When was your last menstrual period?     No and No  Protocols used: Diabetes - High Blood Sugar-A-AH

## 2024-01-07 ENCOUNTER — Ambulatory Visit: Admitting: Family Medicine

## 2024-01-07 ENCOUNTER — Encounter: Payer: Self-pay | Admitting: Family Medicine

## 2024-01-07 VITALS — BP 100/67 | HR 83 | Temp 98.8°F | Ht 62.0 in | Wt 176.0 lb

## 2024-01-07 DIAGNOSIS — E118 Type 2 diabetes mellitus with unspecified complications: Secondary | ICD-10-CM

## 2024-01-07 DIAGNOSIS — I10 Essential (primary) hypertension: Secondary | ICD-10-CM | POA: Diagnosis not present

## 2024-01-07 DIAGNOSIS — E1165 Type 2 diabetes mellitus with hyperglycemia: Secondary | ICD-10-CM | POA: Diagnosis not present

## 2024-01-07 DIAGNOSIS — E756 Lipid storage disorder, unspecified: Secondary | ICD-10-CM | POA: Diagnosis not present

## 2024-01-07 DIAGNOSIS — K219 Gastro-esophageal reflux disease without esophagitis: Secondary | ICD-10-CM

## 2024-01-07 DIAGNOSIS — Z23 Encounter for immunization: Secondary | ICD-10-CM | POA: Diagnosis not present

## 2024-01-07 DIAGNOSIS — E1169 Type 2 diabetes mellitus with other specified complication: Secondary | ICD-10-CM

## 2024-01-07 DIAGNOSIS — E782 Mixed hyperlipidemia: Secondary | ICD-10-CM | POA: Diagnosis not present

## 2024-01-07 NOTE — Progress Notes (Addendum)
 Subjective:  Patient ID: Marilyn Ford, female    DOB: Jul 15, 1960  Age: 63 y.o. MRN: [id number]  CC: Medical Management of Chronic Issues   HPI  Discussed the use of AI scribe software for clinical note transcription with the patient, who gave verbal consent to proceed.  History of Present Illness Marilyn Ford is a 63 year old female with diabetes and hypertension who presents with dizziness and elevated blood sugar levels.  She has been experiencing dizziness, which she attributes to elevated blood sugar levels. Last Thursday, her blood sugar was 300 mg/dL upon waking, despite fasting. This morning, her blood sugar was 198 mg/dL after initially being 699 mg/dL. She takes Trulicity , which she receives from [employer], and had stopped during chemotherapy.  Her blood pressure was recorded at 168/92 mmHg last week.  She has a history of chemotherapy for cancer, which she has completed. She experiences reflux symptoms, including burping and food regurgitation, which she associates with the chemotherapy.  She is on atorvastatin  for high cholesterol, which was noted to be elevated during her last blood work. She has regular meetings with her insurance company to monitor her health post-chemotherapy, including checks for swollen feet.  No excessive nausea or low blood sugar symptoms since resuming Trulicity . Reports dizziness associated with high blood sugar levels.          01/07/2024   10:09 AM 12/19/2023    8:43 AM 12/12/2023   11:48 AM  Depression screen PHQ 2/9  Decreased Interest 1 1 0  Down, Depressed, Hopeless 1 0 0  PHQ - 2 Score 2 1 0  Altered sleeping 1 1 0  Tired, decreased energy 1 1 0  Change in appetite 1 1 0  Feeling bad or failure about yourself  1 0 0  Trouble concentrating 1 1 0  Moving slowly or fidgety/restless 0 0 0  Suicidal thoughts 0 0 0  PHQ-9 Score 7 5 0  Difficult doing work/chores  Somewhat difficult     History Marilyn Ford has a past medical  history of Allergy, Anxiety, Arthritis, Blood transfusion without reported diagnosis, Breast cancer (HCC), Diabetes mellitus without complication (HCC), GERD (gastroesophageal reflux disease), Heart murmur, History of kidney stones, Hypertension, Pneumonia, Stroke (HCC), and Vaginal Pap smear, abnormal.   She has a past surgical history that includes Cholecystectomy; Cesarean section; Colonoscopy with propofol  (N/A, 07/19/2020); polypectomy (07/19/2020); Portacath placement (N/A, 03/20/2022); Breast biopsy (10/31/2022); Simple mastectomy with axillary sentinel node biopsy (Right, 11/01/2022); Radioactive seed guided axillary sentinel lymph node (Right, 11/01/2022); and Mastectomy (Right, 2024).   Her family history includes Breast cancer in her maternal aunt; Cancer in her maternal grandmother; Diabetes in her brother and mother; Uterine cancer (age of onset: 57 - 4) in her mother.She reports that she quit smoking about 8 years ago. Her smoking use included cigarettes. She started smoking about 24 years ago. She has a 12.5 pack-year smoking history. She has never used smokeless tobacco. She reports current alcohol use of about 2.0 - 3.0 standard drinks of alcohol per week. She reports that she does not use drugs.    ROS Review of Systems  Constitutional: Negative.   HENT: Negative.    Eyes:  Negative for visual disturbance.  Respiratory:  Negative for shortness of breath.   Cardiovascular:  Negative for chest pain.  Gastrointestinal:  Negative for abdominal pain.  Musculoskeletal:  Negative for arthralgias.    Objective:  BP 100/67   Pulse 83   Temp 98.8 F (37.1  C)   Ht 5' 2 (1.575 m)   Wt 176 lb (79.8 kg)   SpO2 96%   BMI 32.19 kg/m   BP Readings from Last 3 Encounters:  01/07/24 100/67  12/19/23 130/74  12/12/23 117/73    Wt Readings from Last 3 Encounters:  01/07/24 176 lb (79.8 kg)  12/19/23 177 lb 3.2 oz (80.4 kg)  12/12/23 181 lb 14.1 oz (82.5 kg)     Physical  Exam Constitutional:      General: She is not in acute distress.    Appearance: She is well-developed.  Cardiovascular:     Rate and Rhythm: Normal rate and regular rhythm.  Pulmonary:     Breath sounds: Normal breath sounds.  Musculoskeletal:        General: Normal range of motion.  Skin:    General: Skin is warm and dry.  Neurological:     Mental Status: She is alert and oriented to person, place, and time.      Assessment & Plan:  Encounter for immunization -     Flu vaccine trivalent PF, 6mos and older(Flulaval,Afluria,Fluarix,Fluzone)  Gastroesophageal reflux disease without esophagitis  Controlled type 2 diabetes mellitus with complication, without long-term current use of insulin  (HCC)  Primary hypertension  Mixed hyperlipidemia    Assessment and Plan Assessment & Plan Type 2 diabetes mellitus with hyperglycemia   She experienced elevated blood glucose levels, with a reading of 300 mg/dL last Thursday and 801 mg/dL this morning. She had not been on Trulicity  for about a year due to chemotherapy but resumed it last Thursday. Dizziness associated with hyperglycemia resolved after resuming Trulicity . No significant side effects have been reported since resuming the medication. Resume Trulicity  1.5 mg weekly. Monitor blood glucose levels twice daily, fasting and two hours postprandial after the largest meal. Contact [facility] for Trulicity  supply if needed. Follow up in six weeks to assess blood glucose control and Trulicity  efficacy.  Gastroesophageal reflux disease   She reports persistent symptoms of reflux, including burping and regurgitation of food, attributed to the effects of chemotherapy. These symptoms are expected to improve over time as the effects of chemotherapy diminish.  Hyperlipidemia   She is on the maximum dose of atorvastatin . Recent cholesterol levels were high, but the plan is to re-evaluate cholesterol management after the next blood work. Continue  atorvastatin  at the current dose. Reassess cholesterol levels with the next blood work.  General Health Maintenance   She is due for a flu shot.         Follow-up: Return in about 6 weeks (around 02/18/2024).  Butler Der, M.D.

## 2024-01-08 ENCOUNTER — Encounter

## 2024-01-15 ENCOUNTER — Encounter

## 2024-01-22 ENCOUNTER — Encounter

## 2024-01-29 ENCOUNTER — Encounter

## 2024-01-29 DIAGNOSIS — Z008 Encounter for other general examination: Secondary | ICD-10-CM | POA: Diagnosis not present

## 2024-01-29 DIAGNOSIS — G620 Drug-induced polyneuropathy: Secondary | ICD-10-CM | POA: Diagnosis not present

## 2024-01-29 DIAGNOSIS — E785 Hyperlipidemia, unspecified: Secondary | ICD-10-CM | POA: Diagnosis not present

## 2024-01-29 DIAGNOSIS — E1122 Type 2 diabetes mellitus with diabetic chronic kidney disease: Secondary | ICD-10-CM | POA: Diagnosis not present

## 2024-01-30 ENCOUNTER — Encounter: Payer: Self-pay | Admitting: Family Medicine

## 2024-01-30 ENCOUNTER — Ambulatory Visit: Admitting: Family Medicine

## 2024-01-30 VITALS — BP 122/75 | HR 81 | Temp 98.0°F | Ht 62.0 in | Wt 171.0 lb

## 2024-01-30 DIAGNOSIS — C50811 Malignant neoplasm of overlapping sites of right female breast: Secondary | ICD-10-CM

## 2024-01-30 DIAGNOSIS — Z171 Estrogen receptor negative status [ER-]: Secondary | ICD-10-CM | POA: Diagnosis not present

## 2024-01-30 DIAGNOSIS — Z7985 Long-term (current) use of injectable non-insulin antidiabetic drugs: Secondary | ICD-10-CM | POA: Diagnosis not present

## 2024-01-30 DIAGNOSIS — E118 Type 2 diabetes mellitus with unspecified complications: Secondary | ICD-10-CM

## 2024-01-30 DIAGNOSIS — E1165 Type 2 diabetes mellitus with hyperglycemia: Secondary | ICD-10-CM | POA: Diagnosis not present

## 2024-01-30 DIAGNOSIS — T451X5A Adverse effect of antineoplastic and immunosuppressive drugs, initial encounter: Secondary | ICD-10-CM

## 2024-01-30 DIAGNOSIS — G620 Drug-induced polyneuropathy: Secondary | ICD-10-CM

## 2024-01-30 MED ORDER — TRULICITY 3 MG/0.5ML ~~LOC~~ SOAJ: 3.0000 mg | SUBCUTANEOUS | 1 refills | Status: DC

## 2024-01-30 NOTE — Progress Notes (Signed)
 "  Subjective:  Patient ID: Marilyn Ford, female    DOB: 04-16-1961  Age: 63 y.o. MRN: [id number]  CC: Medical Management of Chronic Issues   HPI  Discussed the use of AI scribe software for clinical note transcription with the patient, who gave verbal consent to proceed.  History of Present Illness Marilyn Ford is a 63 year old female with diabetes and neuropathy who presents for follow-up on her blood sugar levels and medication management.  Her blood sugar levels have been ranging between 167 and 187 mg/dL in the mornings, although she did not bring her log. She recalls these numbers and notes that her blood sugar is good two hours after meals, with a recent reading of 143 mg/dL. She is currently on Trulicity  1.5 mg, which she takes at night to avoid nausea, as suggested by her daughter. She missed her doses on Monday and Tuesday due to trying Zepbound, which caused significant side effects for three days.  She experiences neuropathy, which she attributes to chemotherapy, and has been told it could also be related to diabetes. She is hesitant to take pregabalin  (Lyrica ) due to concerns about side effects, particularly drowsiness. Despite having the prescription filled, she has not started the medication. She experiences drowsiness, which she attributes to radiation treatment, and is concerned that pregabalin  might exacerbate this.  Her social history includes a recent trip to the beach, where she enjoyed eating crab legs. She has a history of stroke and cancer recurrence, and she quit smoking after her stroke. No current dizziness is reported.          01/30/2024   11:08 AM 01/07/2024   10:09 AM 12/19/2023    8:43 AM  Depression screen PHQ 2/9  Decreased Interest 1 1 1   Down, Depressed, Hopeless 1 1 0  PHQ - 2 Score 2 2 1   Altered sleeping 1 1 1   Tired, decreased energy 1 1 1   Change in appetite 1 1 1   Feeling bad or failure about yourself  0 1 0  Trouble concentrating 0 1 1   Moving slowly or fidgety/restless 1 0 0  Suicidal thoughts 0 0 0  PHQ-9 Score 6 7 5   Difficult doing work/chores   Somewhat difficult    History Marilyn Ford has a past medical history of Allergy, Anxiety, Arthritis, Blood transfusion without reported diagnosis, Breast cancer (HCC), Diabetes mellitus without complication (HCC), GERD (gastroesophageal reflux disease), Heart murmur, History of kidney stones, Hypertension, Pneumonia, Stroke (HCC), and Vaginal Pap smear, abnormal.   She has a past surgical history that includes Cholecystectomy; Cesarean section; Colonoscopy with propofol  (N/A, 07/19/2020); polypectomy (07/19/2020); Portacath placement (N/A, 03/20/2022); Breast biopsy (10/31/2022); Simple mastectomy with axillary sentinel node biopsy (Right, 11/01/2022); Radioactive seed guided axillary sentinel lymph node (Right, 11/01/2022); and Mastectomy (Right, 2024).   Her family history includes Breast cancer in her maternal aunt; Cancer in her maternal grandmother; Diabetes in her brother and mother; Uterine cancer (age of onset: 73 - 16) in her mother.She reports that she quit smoking about 8 years ago. Her smoking use included cigarettes. She started smoking about 24 years ago. She has a 12.5 pack-year smoking history. She has never used smokeless tobacco. She reports current alcohol use of about 2.0 - 3.0 standard drinks of alcohol per week. She reports that she does not use drugs.    ROS Review of Systems  Constitutional: Negative.   HENT: Negative.    Eyes:  Negative for visual disturbance.  Respiratory:  Negative  for shortness of breath.   Cardiovascular:  Negative for chest pain.  Gastrointestinal:  Negative for abdominal pain.  Musculoskeletal:  Negative for arthralgias.    Objective:  BP 122/75   Pulse 81   Temp 98 F (36.7 C)   Ht 5' 2 (1.575 m)   Wt 171 lb (77.6 kg)   SpO2 95%   BMI 31.28 kg/m   BP Readings from Last 3 Encounters:  01/30/24 122/75  01/07/24 100/67   12/19/23 130/74    Wt Readings from Last 3 Encounters:  01/30/24 171 lb (77.6 kg)  01/07/24 176 lb (79.8 kg)  12/19/23 177 lb 3.2 oz (80.4 kg)     Physical Exam Physical Exam GENERAL: Alert, cooperative, well developed, no acute distress HEENT: Normocephalic, normal oropharynx, moist mucous membranes CHEST: Clear to auscultation bilaterally, No wheezes, rhonchi, or crackles CARDIOVASCULAR: Normal heart rate and rhythm, S1 and S2 normal without murmurs ABDOMEN: Soft, non-tender, non-distended, without organomegaly, Normal bowel sounds EXTREMITIES: No cyanosis or edema NEUROLOGICAL: Cranial nerves grossly intact, Moves all extremities without gross motor or sensory deficit   Assessment & Plan:  Controlled type 2 diabetes mellitus with complication, without long-term current use of insulin  (HCC)  Malignant neoplasm of overlapping sites of right breast in female, estrogen receptor negative (HCC)  Chemotherapy-induced neuropathy  Other orders -     Trulicity ; Inject 3 mg as directed once a week.  Dispense: 6 mL; Refill: 1    Assessment and Plan Assessment & Plan Type 2 diabetes mellitus with hyperglycemia   Morning blood glucose levels range from 167 to 187 mg/dL, with postprandial readings around 143 mg/dL. There have been no recent episodes of severe hyperglycemia. Current management includes Trulicity  1.5 mg, taken at night to minimize nausea. Increase Trulicity  dosage to improve glycemic control. Record blood glucose readings for a month and bring them to the next appointment.  Chemotherapy-induced peripheral neuropathy   She experiences neuropathic pain likely due to chemotherapy and is hesitant to take pregabalin  (Lyrica ) due to concerns about side effects, primarily drowsiness. Discussed potential side effects and strategies to manage them, emphasizing the importance of trying the medication to assess personal tolerance and effectiveness. Initiate pregabalin  with a gradual  increase in dosage: start with one pill daily, then increase to two, three, and four as tolerated. Monitor for drowsiness and adjust dosage accordingly.       Follow-up: No follow-ups on file.  Butler Der, M.D. "

## 2024-01-31 ENCOUNTER — Encounter: Payer: Self-pay | Admitting: Family Medicine

## 2024-01-31 DIAGNOSIS — G620 Drug-induced polyneuropathy: Secondary | ICD-10-CM | POA: Insufficient documentation

## 2024-02-06 ENCOUNTER — Other Ambulatory Visit: Payer: Self-pay | Admitting: Family Medicine

## 2024-02-06 ENCOUNTER — Telehealth: Payer: Self-pay | Admitting: Family Medicine

## 2024-02-06 MED ORDER — TRULICITY 3 MG/0.5ML ~~LOC~~ SOAJ: 3.0000 mg | SUBCUTANEOUS | 1 refills | Status: DC

## 2024-02-06 NOTE — Telephone Encounter (Signed)
 DONE

## 2024-02-06 NOTE — Telephone Encounter (Signed)
 Copied from CRM [id number]. Topic: Clinical - Prescription Issue >> Feb 05, 2024  4:08 PM Zane F wrote: Reason for CRM:   Prescription in Question: Dulaglutide  (TRULICITY ) 3 MG/0.5ML SOAJ  Patient is calling in to request her prescription above be resubmitted to her preferred pharmacy listed below after the prescription was sent to [facility] by mistake. The patient prefers for her prescriptions to be sent to the pharmacy below due to not having to pay a copay for the prescription when it is received from [facility].    Preferred Pharmacy:   [facility] [place], [place] - [address] [place] [address] Phone: [phone] Fax: [phone] Hours: Not open 24 hours   Callback Number: [id number]

## 2024-02-11 ENCOUNTER — Other Ambulatory Visit: Payer: Self-pay

## 2024-02-19 ENCOUNTER — Ambulatory Visit (INDEPENDENT_AMBULATORY_CARE_PROVIDER_SITE_OTHER): Admitting: Family Medicine

## 2024-02-19 ENCOUNTER — Encounter: Payer: Self-pay | Admitting: Family Medicine

## 2024-02-19 VITALS — BP 107/71 | HR 86 | Temp 98.2°F | Ht 62.0 in | Wt 172.8 lb

## 2024-02-19 DIAGNOSIS — E1165 Type 2 diabetes mellitus with hyperglycemia: Secondary | ICD-10-CM | POA: Diagnosis not present

## 2024-02-19 DIAGNOSIS — E119 Type 2 diabetes mellitus without complications: Secondary | ICD-10-CM

## 2024-02-19 DIAGNOSIS — G620 Drug-induced polyneuropathy: Secondary | ICD-10-CM

## 2024-02-19 DIAGNOSIS — Z171 Estrogen receptor negative status [ER-]: Secondary | ICD-10-CM

## 2024-02-19 DIAGNOSIS — C50811 Malignant neoplasm of overlapping sites of right female breast: Secondary | ICD-10-CM

## 2024-02-19 DIAGNOSIS — I10 Essential (primary) hypertension: Secondary | ICD-10-CM

## 2024-02-19 MED ORDER — TRULICITY 3 MG/0.5ML ~~LOC~~ SOAJ: 3.0000 mg | SUBCUTANEOUS | 1 refills | Status: AC

## 2024-02-19 MED ORDER — TRULICITY 3 MG/0.5ML ~~LOC~~ SOAJ: 3.0000 mg | SUBCUTANEOUS | 1 refills | Status: DC

## 2024-02-19 MED ORDER — LIDOCAINE 5 % EX PTCH: 1.0000 | MEDICATED_PATCH | CUTANEOUS | 3 refills | Status: AC

## 2024-02-19 NOTE — Progress Notes (Signed)
 Subjective:  Patient ID: Marilyn Ford, female    DOB: [birth year]  Age: 63 y.o. MRN: [id number]  CC: Medical Management of Chronic Issues (Med recheck)   HPI  Discussed the use of AI scribe software for clinical note transcription with the patient, who gave verbal consent to proceed.  History of Present Illness Marilyn Ford is a 63 year old female with diabetes who presents for follow-up after a medication change.  She was supposed to switch from 1.5 mg of Trulicity  to 3 mg, but the prescription was sent to her regular pharmacy instead of the patient assistance pharmacy, preventing her from making the change. She continues to use Trulicity  1.5 mg. Over the past week, her fasting blood sugar readings have ranged from 160 to 180 mg/dL, with one reading dropping to 148 mg/dL. She has been checking her blood sugar postprandially but only provided one reading of 186 mg/dL.  She discusses her experience with Lyrica , which she was supposed to take for a month. However, she stopped after three weeks because it was not helping, acknowledging that she did not titrate the dose fully. She reports paresthesia in both feet, describing them as feeling numb, tingling, and burning, especially when walking and sometimes at night.          02/19/2024    9:59 AM 01/30/2024   11:08 AM 01/07/2024   10:09 AM  Depression screen PHQ 2/9  Decreased Interest 1 1 1   Down, Depressed, Hopeless 1 1 1   PHQ - 2 Score 2 2 2   Altered sleeping 1 1 1   Tired, decreased energy 1 1 1   Change in appetite 1 1 1   Feeling bad or failure about yourself  0 0 1  Trouble concentrating 0 0 1  Moving slowly or fidgety/restless 1 1 0  Suicidal thoughts 0 0 0  PHQ-9 Score 6 6 7     History Marilyn Ford has a past medical history of Allergy, Anxiety, Arthritis, Blood transfusion without reported diagnosis, Breast cancer (HCC), Diabetes mellitus without complication (HCC), GERD (gastroesophageal reflux disease), Heart murmur,  History of kidney stones, Hypertension, Pneumonia, Stroke (HCC), and Vaginal Pap smear, abnormal.   She has a past surgical history that includes Cholecystectomy; Cesarean section; Colonoscopy with propofol  (N/A, 07/19/2020); polypectomy (07/19/2020); Portacath placement (N/A, 03/20/2022); Breast biopsy (10/31/2022); Simple mastectomy with axillary sentinel node biopsy (Right, 11/01/2022); Radioactive seed guided axillary sentinel lymph node (Right, 11/01/2022); and Mastectomy (Right, 2024).   Her family history includes Breast cancer in her maternal aunt; Cancer in her maternal grandmother; Diabetes in her brother and mother; Uterine cancer (age of onset: 5 - 13) in her mother.She reports that she quit smoking about 8 years ago. Her smoking use included cigarettes. She started smoking about 24 years ago. She has a 12.5 pack-year smoking history. She has never used smokeless tobacco. She reports current alcohol use of about 2.0 - 3.0 standard drinks of alcohol per week. She reports that she does not use drugs.    ROS Review of Systems  Constitutional: Negative.   HENT: Negative.    Eyes:  Negative for visual disturbance.  Respiratory:  Negative for shortness of breath.   Cardiovascular:  Negative for chest pain.  Gastrointestinal:  Negative for abdominal pain.  Musculoskeletal:  Negative for arthralgias.    Objective:  BP 107/71   Pulse 86   Temp 98.2 F (36.8 C)   Ht 5' 2 (1.575 m)   Wt 172 lb 12.8 oz (78.4 kg)   SpO2  95%   BMI 31.61 kg/m   BP Readings from Last 3 Encounters:  02/19/24 107/71  01/30/24 122/75  01/07/24 100/67    Wt Readings from Last 3 Encounters:  02/19/24 172 lb 12.8 oz (78.4 kg)  01/30/24 171 lb (77.6 kg)  01/07/24 176 lb (79.8 kg)     Physical Exam Physical Exam GENERAL: Alert, cooperative, well developed, no acute distress HEENT: Normocephalic, normal oropharynx, moist mucous membranes CHEST: Clear to auscultation bilaterally, No wheezes,  rhonchi, or crackles CARDIOVASCULAR: Normal heart rate and rhythm, S1 and S2 normal without murmurs ABDOMEN: Soft, non-tender, non-distended, without organomegaly, Normal bowel sounds EXTREMITIES: No cyanosis or edema NEUROLOGICAL: Cranial nerves grossly intact, Moves all extremities without gross motor or sensory deficit   Assessment & Plan:  Chemotherapy-induced neuropathy  Malignant neoplasm of overlapping sites of right breast in female, estrogen receptor negative (HCC)  Primary hypertension  Diabetes mellitus type II, controlled (HCC)  Other orders -     Lidocaine ; Place 1 patch onto the skin daily. Remove & Discard patch within 12 hours or as directed by MD  Dispense: 90 patch; Refill: 3 -     Trulicity ; Inject 3 mg as directed once a week.  Dispense: 6 mL; Refill: 1    Assessment and Plan Assessment & Plan Type 2 diabetes mellitus with hyperglycemia   Blood sugar levels are elevated, with fasting readings between 160 to 180 mg/dL and one at 851 mg/dL. Postprandial reading is 186 mg/dL. The prescription for Trulicity  3 mg was not filled due to a pharmacy error, so she continues on Trulicity  1.5 mg.  Peripheral neuropathy (likely diabetic)   She experiences paresthesia in both feet, with numbness, tingling, and burning sensations, especially when walking and sometimes at night. Lyrica  was discontinued after three weeks due to perceived lack of efficacy without titrating to the full dose. She prefers to try Lidoderm  patches. Prescribe Lidoderm  patches for neuropathic pain in her feet.       Follow-up: Return in about 1 month (around 03/21/2024) for diabetes.  Butler Der, M.D.

## 2024-03-02 ENCOUNTER — Ambulatory Visit: Attending: Surgery

## 2024-03-02 VITALS — Wt 170.5 lb

## 2024-03-02 DIAGNOSIS — Z483 Aftercare following surgery for neoplasm: Secondary | ICD-10-CM | POA: Insufficient documentation

## 2024-03-02 NOTE — Therapy (Signed)
 OUTPATIENT PHYSICAL THERAPY SOZO SCREENING NOTE   Patient Name: Marilyn Ford MRN: [id number] DOB:Feb 18, 1961, 63 y.o., female Today's Date: 03/02/2024  PCP: [name], MD REFERRING PROVIDER: Vernetta Berg, MD   PT End of Session - 03/02/24 [phone]     Visit Number 14   # unchanged due to screen only   PT Start Time 0946    PT Stop Time 0950    PT Time Calculation (min) 4 min    Activity Tolerance Patient tolerated treatment well    Behavior During Therapy WFL for tasks assessed/performed          Past Medical History:  Diagnosis Date   Allergy    Anxiety    Arthritis    Blood transfusion without reported diagnosis    Breast cancer (HCC)    right breast   Diabetes mellitus without complication (HCC)    GERD (gastroesophageal reflux disease)    Heart murmur    as a child   History of kidney stones    Hypertension    Pneumonia    Stroke [facility])    Vaginal Pap smear, abnormal    Past Surgical History:  Procedure Laterality Date   BREAST BIOPSY  10/31/2022   US  RT RADIOACTIVE SEED LOC 10/31/2022 [facility]   CESAREAN SECTION     CHOLECYSTECTOMY     COLONOSCOPY WITH PROPOFOL  N/A 07/19/2020   Procedure: COLONOSCOPY WITH PROPOFOL ;  Surgeon: Eartha Angelia Sieving, MD;  Location: [facility];  Service: Gastroenterology;  Laterality: N/A;  AM   MASTECTOMY Right 2024   POLYPECTOMY  07/19/2020   Procedure: POLYPECTOMY;  Surgeon: Eartha Angelia Sieving, MD;  Location: [facility];  Service: Gastroenterology;;   PORTACATH PLACEMENT N/A 03/20/2022   Procedure: INSERTION PORT-A-CATH;  Surgeon: Vernetta Berg, MD;  Location: [facility];  Service: General;  Laterality: N/A;   RADIOACTIVE SEED GUIDED AXILLARY SENTINEL LYMPH NODE Right 11/01/2022   Procedure: RADIOACTIVE SEED GUIDED RIGHT AXILLARY SENTINEL LYMPH NODE DISSECTION;  Surgeon: Vernetta Berg, MD;  Location: [facility];  Service: General;  Laterality: Right;   SIMPLE MASTECTOMY  WITH AXILLARY SENTINEL NODE BIOPSY Right 11/01/2022   Procedure: RIGHT SIMPLE MASTECTOMY;  Surgeon: Vernetta Berg, MD;  Location:  SURGERY CENTER;  Service: General;  Laterality: Right;   Patient Active Problem List   Diagnosis Date Noted   Chemotherapy-induced neuropathy 01/31/2024   S/P mastectomy, right 11/01/2022   Genetic testing 06/05/2022   Neutropenia, drug-induced 05/28/2022   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) 03/05/2022   Encounter for screening fecal occult blood testing 03/01/2022   Routine cervical smear 03/01/2022   Axillary adenopathy 02/20/2022   Mass of lower outer quadrant of right breast 01/30/2022   Cat bite of index finger 09/12/2021   Gastroesophageal reflux disease without esophagitis 10/20/2019   GAD (generalized anxiety disorder) 10/20/2019   Insomnia due to medical condition 10/20/2019   Hemiparesis affecting right side as late effect of cerebrovascular accident (CVA) (HCC) 04/22/2015   Diabetes mellitus type II, controlled (HCC) 12/23/2014   Hypertension 12/23/2014    REFERRING DIAG: right breast cancer at risk for lymphedema  THERAPY DIAG: Aftercare following surgery for neoplasm  PERTINENT HISTORY: 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 It measures 3.8 cm and is located in the inner lower quadrant. 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 She completed neoadjuvant chemo for possible inflammatory breast cancer. 2 nodes taken at biopsy - 1 was negative the  other was positive. Had blood transfusion on 10/05/22. Rt mastectomy and SLNB on 11/01/22 2/6 nodes positive.s/p radiation completed 07/2023 and Keytruda .  Hx of DVT and stroke in 2015. Concerned about stiffness in her shoulder  PRECAUTIONS: right UE Lymphedema risk, None  SUBJECTIVE: Pt returns for her 3 month L-Dex screen.   PAIN:  Are you having pain? No  SOZO SCREENING: Patient was assessed today using the SOZO machine to determine  the lymphedema index score. This was compared to her baseline score. It was determined that she is within the recommended range when compared to her baseline and no further action is needed at this time. She will continue SOZO screenings. These are done every 3 months for 2 years post operatively followed by every 6 months for 2 years, and then annually.   L-DEX FLOWSHEETS - 03/02/24 0900       L-DEX LYMPHEDEMA SCREENING   Measurement Type Unilateral    L-DEX MEASUREMENT EXTREMITY Upper Extremity    POSITION  Standing    DOMINANT SIDE Right    At Risk Side Right    BASELINE SCORE (UNILATERAL) -6.8    L-DEX SCORE (UNILATERAL) -8.3    VALUE CHANGE (UNILAT) -1.5          P: Cont every 3 month L-Dex screens until 10/2024, then transition to 6 months until 10/2026.   Aden Berwyn Caldron, PTA 03/02/2024, 9:49 AM

## 2024-03-12 ENCOUNTER — Telehealth: Payer: Self-pay | Admitting: Pharmacy Technician

## 2024-03-12 ENCOUNTER — Other Ambulatory Visit (HOSPITAL_COMMUNITY): Payer: Self-pay

## 2024-03-12 NOTE — Telephone Encounter (Signed)
 Pharmacy Patient Advocate Encounter   Received notification from Onbase that prior authorization for Lidocaine  5% patches  is required/requested.   Insurance verification completed.   The patient is insured through [facility].   Per test claim: PA required; PA submitted to above mentioned insurance via Latent Key/confirmation #/[id number] [place] Status is pending

## 2024-03-12 NOTE — Telephone Encounter (Signed)
 Pharmacy Patient Advocate Encounter  Received notification from [place] [employer] that Prior Authorization for Lidocaine  5% patches has been APPROVED from 03/12/24 to 03/12/25. Unable to obtain price due to refill too soon rejection, last fill date 03/12/24 next available fill date01/13/26   PA #/Case ID/Reference #: [id number]

## 2024-03-12 NOTE — Telephone Encounter (Signed)
 Approved.

## 2024-03-23 ENCOUNTER — Ambulatory Visit: Payer: Self-pay | Admitting: Family Medicine

## 2024-03-23 ENCOUNTER — Encounter: Payer: Self-pay | Admitting: Family Medicine

## 2024-03-23 ENCOUNTER — Ambulatory Visit (INDEPENDENT_AMBULATORY_CARE_PROVIDER_SITE_OTHER): Payer: Self-pay | Admitting: Family Medicine

## 2024-03-23 VITALS — BP 118/78 | HR 89 | Temp 97.5°F | Ht 62.0 in | Wt 169.0 lb

## 2024-03-23 DIAGNOSIS — Z7985 Long-term (current) use of injectable non-insulin antidiabetic drugs: Secondary | ICD-10-CM

## 2024-03-23 DIAGNOSIS — E782 Mixed hyperlipidemia: Secondary | ICD-10-CM

## 2024-03-23 DIAGNOSIS — E119 Type 2 diabetes mellitus without complications: Secondary | ICD-10-CM

## 2024-03-23 DIAGNOSIS — E756 Lipid storage disorder, unspecified: Secondary | ICD-10-CM | POA: Diagnosis not present

## 2024-03-23 DIAGNOSIS — E1169 Type 2 diabetes mellitus with other specified complication: Secondary | ICD-10-CM | POA: Diagnosis not present

## 2024-03-23 DIAGNOSIS — I10 Essential (primary) hypertension: Secondary | ICD-10-CM

## 2024-03-23 LAB — CBC WITH DIFFERENTIAL/PLATELET
Basophils Absolute: 0 x10E3/uL (ref 0.0–0.2)
Basos: 1 %
EOS (ABSOLUTE): 0.1 x10E3/uL (ref 0.0–0.4)
Eos: 1 %
Hematocrit: 38.7 % (ref 34.0–46.6)
Hemoglobin: 12.6 g/dL (ref 11.1–15.9)
Immature Grans (Abs): 0 x10E3/uL (ref 0.0–0.1)
Immature Granulocytes: 0 %
Lymphocytes Absolute: 0.6 x10E3/uL — ABNORMAL LOW (ref 0.7–3.1)
Lymphs: 12 %
MCH: 32.6 pg (ref 26.6–33.0)
MCHC: 32.6 g/dL (ref 31.5–35.7)
MCV: 100 fL — ABNORMAL HIGH (ref 79–97)
Monocytes Absolute: 0.3 x10E3/uL (ref 0.1–0.9)
Monocytes: 6 %
Neutrophils Absolute: 4.3 x10E3/uL (ref 1.4–7.0)
Neutrophils: 80 %
Platelets: 154 x10E3/uL (ref 150–450)
RBC: 3.86 x10E6/uL (ref 3.77–5.28)
RDW: 14.3 % (ref 11.7–15.4)
WBC: 5.3 x10E3/uL (ref 3.4–10.8)

## 2024-03-23 LAB — COMPREHENSIVE METABOLIC PANEL WITH GFR
ALT: 29 IU/L (ref 0–32)
AST: 20 IU/L (ref 0–40)
Albumin: 4.3 g/dL (ref 3.9–4.9)
Alkaline Phosphatase: 117 IU/L (ref 49–135)
BUN/Creatinine Ratio: 17 (ref 12–28)
BUN: 16 mg/dL (ref 8–27)
Bilirubin Total: 0.6 mg/dL (ref 0.0–1.2)
CO2: 23 mmol/L (ref 20–29)
Calcium: 10 mg/dL (ref 8.7–10.3)
Chloride: 100 mmol/L (ref 96–106)
Creatinine, Ser: 0.96 mg/dL (ref 0.57–1.00)
Globulin, Total: 2.5 g/dL (ref 1.5–4.5)
Glucose: 146 mg/dL — ABNORMAL HIGH (ref 70–99)
Potassium: 4.2 mmol/L (ref 3.5–5.2)
Sodium: 140 mmol/L (ref 134–144)
Total Protein: 6.8 g/dL (ref 6.0–8.5)
eGFR: 66 mL/min/1.73 (ref >59)

## 2024-03-23 LAB — LIPID PANEL
Chol/HDL Ratio: 4 ratio (ref 0.0–4.4)
Cholesterol, Total: 148 mg/dL (ref 100–199)
HDL: 37 mg/dL — ABNORMAL LOW (ref >39)
LDL Chol Calc (NIH): 66 mg/dL (ref 0–99)
Triglycerides: 285 mg/dL — ABNORMAL HIGH (ref 0–149)
VLDL Cholesterol Cal: 45 mg/dL — ABNORMAL HIGH (ref 5–40)

## 2024-03-23 LAB — BAYER DCA HB A1C WAIVED: HB A1C (BAYER DCA - WAIVED): 5.6 % (ref 4.8–5.6)

## 2024-03-23 MED ORDER — ONDANSETRON HCL 8 MG PO TABS: 8.0000 mg | ORAL_TABLET | Freq: Three times a day (TID) | ORAL | 3 refills | Status: AC | PRN

## 2024-03-23 MED ORDER — MELOXICAM 15 MG PO TABS: 15.0000 mg | ORAL_TABLET | Freq: Every day | ORAL | 0 refills | Status: AC

## 2024-03-23 MED ORDER — PREGABALIN 50 MG PO CAPS: ORAL_CAPSULE | ORAL | 1 refills | Status: AC

## 2024-03-23 NOTE — Progress Notes (Signed)
 Subjective:  Patient ID: Arland ONEIDA Halo, female    DOB: [birth year]  Age: 63 y.o. MRN: [id number]  CC: Diabetes   HPI  Discussed the use of AI scribe software for clinical note transcription with the patient, who gave verbal consent to proceed.  History of Present Illness SAHIAN KERNEY is a 63 year old female with diabetes who presents for follow-up on her diabetes management and cancer surveillance.  She started taking Trulicity  three weeks ago, which has improved her fasting blood glucose levels from 180 mg/dL to 868 mg/dL in the mornings. She does not check her postprandial blood glucose levels due to time constraints. Her last hemoglobin A1c was 6.3%, and she is awaiting a new result.  She completed chemotherapy and radiation therapy for cancer, with radiation concluding in March. A PET scan conducted two to three months ago was part of her follow-up. She is scheduled for a blood test for cancer surveillance on December 1st, which she undergoes every three months.  She experiences constipation, having bowel movements every two to three days, and manages it with over-the-counter stool softeners and Miralax. She previously had diarrhea but now reports consistent constipation.  She takes krill oil three times a day for cholesterol management and is uncertain about her current use of Vascepa . She also takes CoQ10 to manage muscle aches associated with her cholesterol medication.  She has no current issues with heartburn, which resolved spontaneously. She discontinued pregabalin  after starting over-the-counter patches for pain management.  She quit smoking in 2015 and does not experience breathing difficulties.          03/23/2024   11:06 AM 02/19/2024    9:59 AM 01/30/2024   11:08 AM  Depression screen PHQ 2/9  Decreased Interest 1 1 1   Down, Depressed, Hopeless 1 1 1   PHQ - 2 Score 2 2 2   Altered sleeping 1 1 1   Tired, decreased energy 2 1 1   Change in appetite 1 1 1    Feeling bad or failure about yourself  0 0 0  Trouble concentrating 1 0 0  Moving slowly or fidgety/restless 1 1 1   Suicidal thoughts 0 0 0  PHQ-9 Score 8 6  6    Difficult doing work/chores Somewhat difficult       Data saved with a previous flowsheet row definition    History Tomasa has a past medical history of Allergy, Anxiety, Arthritis, Blood transfusion without reported diagnosis, Breast cancer (HCC), Diabetes mellitus without complication (HCC), GERD (gastroesophageal reflux disease), Heart murmur, History of kidney stones, Hypertension, Pneumonia, Stroke (HCC), and Vaginal Pap smear, abnormal.   She has a past surgical history that includes Cholecystectomy; Cesarean section; Colonoscopy with propofol  (N/A, 07/19/2020); polypectomy (07/19/2020); Portacath placement (N/A, 03/20/2022); Breast biopsy (10/31/2022); Simple mastectomy with axillary sentinel node biopsy (Right, 11/01/2022); Radioactive seed guided axillary sentinel lymph node (Right, 11/01/2022); and Mastectomy (Right, 2024).   Her family history includes Breast cancer in her maternal aunt; Cancer in her maternal grandmother; Diabetes in her brother and mother; Uterine cancer (age of onset: 20 - 6) in her mother.She reports that she quit smoking about 9 years ago. Her smoking use included cigarettes. She started smoking about 24 years ago. She has a 12.5 pack-year smoking history. She has never used smokeless tobacco. She reports current alcohol use of about 2.0 - 3.0 standard drinks of alcohol per week. She reports that she does not use drugs.    ROS Review of Systems  Constitutional: Negative.   HENT:  Negative for congestion.   Eyes:  Negative for visual disturbance.  Respiratory:  Negative for shortness of breath.   Cardiovascular:  Negative for chest pain.  Gastrointestinal:  Negative for abdominal pain, constipation, diarrhea, nausea and vomiting.  Genitourinary:  Negative for difficulty urinating.   Musculoskeletal:  Negative for arthralgias and myalgias.  Neurological:  Negative for headaches.  Psychiatric/Behavioral:  Negative for sleep disturbance.     Objective:  BP 118/78   Pulse 89   Temp (!) 97.5 F (36.4 C)   Ht 5' 2 (1.575 m)   Wt 169 lb (76.7 kg)   SpO2 99%   BMI 30.91 kg/m   BP Readings from Last 3 Encounters:  03/23/24 118/78  02/19/24 107/71  01/30/24 122/75    Wt Readings from Last 3 Encounters:  03/23/24 169 lb (76.7 kg)  03/02/24 170 lb 8 oz (77.3 kg)  02/19/24 172 lb 12.8 oz (78.4 kg)     Physical Exam Physical Exam GENERAL: Alert, cooperative, well developed, no acute distress HEENT: Normocephalic, normal oropharynx, moist mucous membranes CHEST: Clear to auscultation bilaterally, No wheezes, rhonchi, or crackles CARDIOVASCULAR: Normal heart rate and rhythm, S1 and S2 normal without murmurs ABDOMEN: Soft, non-tender, non-distended, without organomegaly, Normal bowel sounds EXTREMITIES: No cyanosis or edema NEUROLOGICAL: Cranial nerves grossly intact, Moves all extremities without gross motor or sensory deficit   Assessment & Plan:  Diabetes mellitus type II, controlled (HCC) -     Microalbumin / creatinine urine ratio -     Bayer DCA Hb A1c Waived  Primary hypertension -     CBC with Differential/Platelet  Diabetic lipidosis (HCC) -     Microalbumin / creatinine urine ratio -     Bayer DCA Hb A1c Waived -     CBC with Differential/Platelet -     Comprehensive metabolic panel with GFR -     Lipid panel  Mixed hyperlipidemia -     Comprehensive metabolic panel with GFR -     Lipid panel    Assessment and Plan Assessment & Plan Type 2 diabetes mellitus with hyperglycemia and diabetic polyneuropathy   Blood glucose levels have improved with Trulicity , with morning readings decreasing from 180 to 131 mg/dL over three weeks. A1c has improved to 5.6%, indicating excellent control. Continue Trulicity  1.5 mg subcutaneous once a week.  Encourage monitoring of postprandial blood glucose levels. Restart pregabalin  as previously prescribed.  Mixed hyperlipidemia   Currently taking krill oil for cholesterol management. Vascepa  was previously recommended for elevated triglycerides, but she is not currently taking it. Recheck cholesterol levels to determine the need for Vascepa . Continue krill oil supplementation. Consider Vascepa  if cholesterol levels remain elevated.  Essential hypertension   Blood pressure is well-controlled.  Constipation   Experiencing constipation with bowel movements every two to three days. Previously used stool softeners and Miralax with some improvement. Continue using stool softeners and Miralax as needed.  History of cancer, currently under surveillance   Currently in a holding state post-chemotherapy and radiation for breast cancer. Regular blood tests every three months to monitor for recurrence. Recent PET scan showed no suspicious findings. Continue regular blood tests every three months for cancer surveillance.       Follow-up: No follow-ups on file.  Butler Der, M.D.

## 2024-03-24 LAB — MICROALBUMIN / CREATININE URINE RATIO
Creatinine, Urine: 123.7 mg/dL
Microalb/Creat Ratio: 4 mg/g{creat} (ref 0–29)
Microalbumin, Urine: 5.2 ug/mL

## 2024-03-25 NOTE — Progress Notes (Signed)
Hello Jini,  Your lab result is normal and/or stable.Some minor variations that are not significant are commonly marked abnormal, but do not represent any medical problem for you.  Best regards, Mechele Claude, M.D.

## 2024-04-06 ENCOUNTER — Inpatient Hospital Stay: Attending: Hematology and Oncology

## 2024-04-06 DIAGNOSIS — T451X5A Adverse effect of antineoplastic and immunosuppressive drugs, initial encounter: Secondary | ICD-10-CM | POA: Insufficient documentation

## 2024-04-06 DIAGNOSIS — D6481 Anemia due to antineoplastic chemotherapy: Secondary | ICD-10-CM | POA: Diagnosis not present

## 2024-04-06 DIAGNOSIS — Z79899 Other long term (current) drug therapy: Secondary | ICD-10-CM | POA: Diagnosis not present

## 2024-04-06 DIAGNOSIS — Z9011 Acquired absence of right breast and nipple: Secondary | ICD-10-CM | POA: Diagnosis not present

## 2024-04-06 DIAGNOSIS — Z923 Personal history of irradiation: Secondary | ICD-10-CM | POA: Diagnosis not present

## 2024-04-06 DIAGNOSIS — Z1722 Progesterone receptor negative status: Secondary | ICD-10-CM | POA: Diagnosis not present

## 2024-04-06 DIAGNOSIS — D696 Thrombocytopenia, unspecified: Secondary | ICD-10-CM | POA: Diagnosis not present

## 2024-04-06 DIAGNOSIS — C773 Secondary and unspecified malignant neoplasm of axilla and upper limb lymph nodes: Secondary | ICD-10-CM | POA: Diagnosis present

## 2024-04-06 DIAGNOSIS — Z171 Estrogen receptor negative status [ER-]: Secondary | ICD-10-CM | POA: Insufficient documentation

## 2024-04-06 DIAGNOSIS — Z9221 Personal history of antineoplastic chemotherapy: Secondary | ICD-10-CM | POA: Diagnosis not present

## 2024-04-06 DIAGNOSIS — Z1732 Human epidermal growth factor receptor 2 negative status: Secondary | ICD-10-CM | POA: Insufficient documentation

## 2024-04-06 DIAGNOSIS — C50811 Malignant neoplasm of overlapping sites of right female breast: Secondary | ICD-10-CM | POA: Insufficient documentation

## 2024-04-06 LAB — CBC WITH DIFFERENTIAL/PLATELET
Abs Immature Granulocytes: 0.01 K/uL (ref 0.00–0.07)
Basophils Absolute: 0 K/uL (ref 0.0–0.1)
Basophils Relative: 1 %
Eosinophils Absolute: 0.1 K/uL (ref 0.0–0.5)
Eosinophils Relative: 1 %
HCT: 34.8 % — ABNORMAL LOW (ref 36.0–46.0)
Hemoglobin: 11.8 g/dL — ABNORMAL LOW (ref 12.0–15.0)
Immature Granulocytes: 0 %
Lymphocytes Relative: 14 %
Lymphs Abs: 0.6 K/uL — ABNORMAL LOW (ref 0.7–4.0)
MCH: 33 pg (ref 26.0–34.0)
MCHC: 33.9 g/dL (ref 30.0–36.0)
MCV: 97.2 fL (ref 80.0–100.0)
Monocytes Absolute: 0.4 K/uL (ref 0.1–1.0)
Monocytes Relative: 8 %
Neutro Abs: 3.4 K/uL (ref 1.7–7.7)
Neutrophils Relative %: 76 %
Platelets: 149 K/uL — ABNORMAL LOW (ref 150–400)
RBC: 3.58 MIL/uL — ABNORMAL LOW (ref 3.87–5.11)
RDW: 14 % (ref 11.5–15.5)
WBC: 4.5 K/uL (ref 4.0–10.5)
nRBC: 0 % (ref 0.0–0.2)

## 2024-04-06 LAB — COMPREHENSIVE METABOLIC PANEL WITH GFR
ALT: 26 U/L (ref 0–44)
AST: 24 U/L (ref 15–41)
Albumin: 4.3 g/dL (ref 3.5–5.0)
Alkaline Phosphatase: 118 U/L (ref 38–126)
Anion gap: 11 (ref 5–15)
BUN: 17 mg/dL (ref 8–23)
CO2: 28 mmol/L (ref 22–32)
Calcium: 9.5 mg/dL (ref 8.9–10.3)
Chloride: 102 mmol/L (ref 98–111)
Creatinine, Ser: 0.91 mg/dL (ref 0.44–1.00)
GFR, Estimated: >60 mL/min (ref >60)
Glucose, Bld: 121 mg/dL — ABNORMAL HIGH (ref 70–99)
Potassium: 4.3 mmol/L (ref 3.5–5.1)
Sodium: 140 mmol/L (ref 135–145)
Total Bilirubin: 0.5 mg/dL (ref 0.0–1.2)
Total Protein: 7.2 g/dL (ref 6.5–8.1)

## 2024-04-06 LAB — MAGNESIUM: Magnesium: 2 mg/dL (ref 1.7–2.4)

## 2024-04-06 NOTE — Progress Notes (Signed)
 Patients port flushed without difficulty.  Good blood return noted with no bruising or swelling noted at site.  Labs drawn per orders.  VSS with discharge and left in satisfactory condition with no s/s of distress noted. All follow ups as scheduled.       Kierre Deines

## 2024-04-07 ENCOUNTER — Telehealth: Payer: Self-pay

## 2024-04-07 LAB — CANCER ANTIGEN 15-3: CA 15-3: 38.3 U/mL — ABNORMAL HIGH (ref 0.0–25.0)

## 2024-04-07 LAB — CANCER ANTIGEN 27.29: CA 27.29: 60.7 U/mL — ABNORMAL HIGH (ref 0.0–38.6)

## 2024-04-07 NOTE — Telephone Encounter (Signed)
 Patient dropped off completed re-enrollment [facility] application for Trulicity  assistance.  In process of completing provider pages.

## 2024-04-08 NOTE — Telephone Encounter (Signed)
 PAP: re-enrollment application for Trulicity  has been submitted to [facility], via fax.

## 2024-04-13 ENCOUNTER — Inpatient Hospital Stay: Admitting: Oncology

## 2024-04-14 ENCOUNTER — Inpatient Hospital Stay

## 2024-04-14 ENCOUNTER — Inpatient Hospital Stay: Admitting: Hematology

## 2024-04-14 ENCOUNTER — Other Ambulatory Visit: Payer: Self-pay

## 2024-04-14 VITALS — BP 115/72 | HR 96 | Temp 97.7°F | Resp 16 | Wt 169.3 lb

## 2024-04-14 DIAGNOSIS — C773 Secondary and unspecified malignant neoplasm of axilla and upper limb lymph nodes: Secondary | ICD-10-CM | POA: Diagnosis not present

## 2024-04-14 DIAGNOSIS — Z171 Estrogen receptor negative status [ER-]: Secondary | ICD-10-CM | POA: Diagnosis not present

## 2024-04-14 DIAGNOSIS — C50811 Malignant neoplasm of overlapping sites of right female breast: Secondary | ICD-10-CM | POA: Diagnosis not present

## 2024-04-14 NOTE — Patient Instructions (Signed)
 [facility] - A DEPT OF . [facility]  Discharge Instructions: Thank you for choosing [facility] to provide your oncology and hematology care.  If you have a lab appointment with the Cancer Center - please note that after April 8th, 2024, all labs will be drawn in the cancer center.  You do not have to check in or register with the main entrance as you have in the past but will complete your check-in in the cancer center.  Wear comfortable clothing and clothing appropriate for easy access to any Portacath or PICC line.   We strive to give you quality time with your provider. You may need to reschedule your appointment if you arrive late (15 or more minutes).  Arriving late affects you and other patients whose appointments are after yours.  Also, if you miss three or more appointments without notifying the office, you may be dismissed from the clinic at the provider's discretion.      For prescription refill requests, have your pharmacy contact our office and allow 72 hours for refills to be completed.    Today you received the following Genetic labs drawn, return as scheduled.   To help prevent nausea and vomiting after your treatment, we encourage you to take your nausea medication as directed.  BELOW ARE SYMPTOMS THAT SHOULD BE REPORTED IMMEDIATELY: *FEVER GREATER THAN 100.4 F (38 C) OR HIGHER *CHILLS OR SWEATING *NAUSEA AND VOMITING THAT IS NOT CONTROLLED WITH YOUR NAUSEA MEDICATION *UNUSUAL SHORTNESS OF BREATH *UNUSUAL BRUISING OR BLEEDING *URINARY PROBLEMS (pain or burning when urinating, or frequent urination) *BOWEL PROBLEMS (unusual diarrhea, constipation, pain near the anus) TENDERNESS IN MOUTH AND THROAT WITH OR WITHOUT PRESENCE OF ULCERS (sore throat, sores in mouth, or a toothache) UNUSUAL RASH, SWELLING OR PAIN  UNUSUAL VAGINAL DISCHARGE OR ITCHING   Items with * indicate a potential emergency and should be followed up as soon as  possible or go to the Emergency Department if any problems should occur.  Please show the CHEMOTHERAPY ALERT CARD or IMMUNOTHERAPY ALERT CARD at check-in to the Emergency Department and triage nurse.  Should you have questions after your visit or need to cancel or reschedule your appointment, please contact [facility] - A DEPT OF [facility] [phone]  and follow the prompts.  Office hours are 8:00 a.m. to 4:30 p.m. Monday - Friday. Please note that voicemails left after 4:00 p.m. may not be returned until the following business day.  We are closed weekends and major holidays. You have access to a nurse at all times for urgent questions. Please call the main number to the clinic [phone] and follow the prompts.  For any non-urgent questions, you may also contact your provider using MyChart. We now offer e-Visits for anyone 81 and older to request care online for non-urgent symptoms. For details visit [email or web address].   Also download the MyChart app! Go to the app store, search MyChart, open the app, select [facility], and log in with your MyChart username and password.

## 2024-04-14 NOTE — Assessment & Plan Note (Signed)
 Bilateral diagnostic mammogram (02/20/2022): Suspicious right axillary lymphadenopathy.  Indeterminate intramammary lymph node in the right breast at 9:00.  New right breast skin and trabecular thickening, related to vascular congestion from enlarged lymph nodes in the right axilla. - Right axillary lymph node core biopsy (02/20/2022): Morphology and IHC compatible with primary breast cancer with differential diagnosis of urothelial carcinoma and primary lung carcinoma (squamous).  Grade 3.  ER 0%, PR 0%, HER2 2+, Ki-67 60%, HER2 negative by FISH. - Right breast lymph node biopsy at 9:00 (02/20/2022): Negative for carcinoma. - MRI breast (03/02/2022): In the upper outer right breast, mid to posterior depth there is a patchy clumped non-mass enhancement in a linear orientation spanning approximately 3.8 cm.  There is diffuse thickening of the skin in the right breast with skin enhancement.  Left breast with no mass or abnormal enhancement.  Numerous bulky matted lymph nodes in the right axilla. - Right breast UOQ biopsy (03/19/2022): Benign breast with fibrocystic changes including stromal fibrosis, adenosis, usual ductal hyperplasia.  Negative for carcinoma. - PET scan (03/22/2022): Bulky hypermetabolic right axillary and subpectoral lymphadenopathy.  No other definite new sites of metastatic disease.  6 mm right lower lobe lung nodule with no FDG uptake. - Neoadjuvant chemotherapy with weekly paclitaxel  and pembrolizumab  from 04/02/2022 through 06/21/2022,  [facility] and pembrolizumab  from 07/12/2022 through 09/27/2022 - Right mastectomy and lymph node excision (11/01/2022): YpT0, YPN1A, 2/3 lymph nodes positive for metastatic disease, no ECE.  Surgical margins negative. - Germline mutation testing: Negative - Adjuvant pembrolizumab  and Xeloda  (CREATE-X) started on 12/05/2022, Xeloda  dose reduced to 2 tablets twice daily with cycle 2 due to mucositis and feet hurting with skin peeling.  Completed Xeloda  around  05/23/2023.  Last dose of Keytruda  on 06/03/2023. - XRT to the right chest wall from 06/03/2023 - 07/19/2023 under the direction of Dr. Dewey.

## 2024-04-14 NOTE — Progress Notes (Signed)
 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  Indeterminate intramammary lymph node in the right breast at 9:00.  New right breast skin and trabecular thickening, related to vascular congestion from enlarged lymph nodes in the right axilla. - Right axillary lymph node core biopsy (02/20/2022): Morphology and IHC compatible with primary breast cancer with differential diagnosis of urothelial carcinoma and primary lung carcinoma (squamous).  Grade 3.  ER 0%, PR 0%, HER2 2+, Ki-67 60%, HER2 negative by FISH. - Right breast lymph node biopsy at 9:00 (02/20/2022): Negative for carcinoma. - MRI breast (03/02/2022): In the upper outer right breast, mid to posterior depth there is a patchy clumped non-mass enhancement in a linear orientation spanning approximately 3.8 cm.  There is diffuse thickening of the skin in the right breast with skin enhancement.  Left breast with no mass or abnormal enhancement.  Numerous bulky matted lymph nodes in the right axilla. - Right breast UOQ biopsy (03/19/2022): Benign breast with fibrocystic changes including stromal fibrosis, adenosis, usual ductal hyperplasia.  Negative for carcinoma. -  PET scan (03/22/2022): Bulky hypermetabolic right axillary and subpectoral lymphadenopathy.  No other definite new sites of metastatic disease.  6 mm right lower lobe lung nodule with no FDG uptake. - Neoadjuvant chemotherapy with weekly paclitaxel  and pembrolizumab  from 04/02/2022 through 06/21/2022,  AC and pembrolizumab  from 07/12/2022 through 09/27/2022 - Right mastectomy and lymph node excision (11/01/2022): YpT0, YPN1A, 2/3 lymph nodes positive for metastatic disease, no ECE.  Surgical margins negative. - Germline mutation testing: Negative - Adjuvant pembrolizumab  and Xeloda  (CREATE-X) started on 12/05/2022, Xeloda  dose reduced to 2 tablets twice daily with cycle 2 due to mucositis and feet hurting with skin peeling.  Completed Xeloda  around 05/23/2023.  Last dose of Keytruda  on 06/03/2023. - XRT to the right chest wall from 06/03/2023 - 07/19/2023 under the direction of Dr. Dewey.     Assessment & Plan Triple negative breast cancer, right, post-treatment Status post neoadjuvant chemotherapy, right mastectomy with lymph node dissection, adjuvant chemotherapy (including oral agents and immunotherapy), and radiation therapy completed earlier this year. Achieved complete response in the breast; two of six lymph nodes positive, indicating elevated recurrence risk. Currently asymptomatic with no evidence of recurrence on recent PET scan or physical examination. Tumor markers (CA 15-3, CA 27.29) remain mildly elevated but stable since last visit and are not specific for recurrence. Ongoing surveillance. - Reviewed recent tumor marker results and PET scan findings. - Performed physical examination including breast, chest wall, abdomen, and lymph nodes, she is unremarkable -Her left screening mammogram was normal in May 2025 - Ordered Guardian Reveal blood test (ctDNA) for early recurrence monitoring. - Scheduled follow-up in three months with repeat laboratory studies one week prior. - Advised continuation of  regular exercise and arm mobility to support post-mastectomy recovery. - Provided anticipatory guidance regarding persistent fatigue and  expected gradual improvement.  Anemia secondary to chemotherapy Mild anemia, likely residual from prior chemotherapy, with hemoglobin 11.8 g/dL. Asymptomatic, with other blood counts near normal. - Reviewed recent laboratory results including hemoglobin and platelet count. - Reassured regarding mild anemia as a likely residual effect of chemotherapy. - No additional intervention required; routine monitoring with next scheduled laboratory studies.  Plan - She is clinically doing very well, exam was unremarkable, no clinical concern for recurrence.  - Lab reviewed, she has slightly elevated tumor markers - Will obtain guardian reveal today and continue every 3 months - Follow-up in 3 months with lab, port flush 1 week before   SUMMARY OF ONCOLOGIC HISTORY: Oncology History Overview Note   Cancer Staging  Malignant neoplasm of overlapping sites of right female breast [facility]) Staging form: Breast, AJCC 8th Edition - Clinical stage from 03/08/2022: Stage IIIC (cT2, cN2, cM0, G3, ER-, PR-, HER2-) - Signed by Lanny Callander, MD on 03/08/2022    Malignant neoplasm of overlapping sites of right female breast [facility])  02/20/2022 Mammogram   CLINICAL DATA:  63 year old female recalled from screening mammography 03/01/2021 for right breast calcifications and subsequent benign discordant biopsy of these calcifications in the central posterior right breast December 2022 with excision recommended. The patient initially followed up with surgery in April however canceled her scheduled surgery and most recently followed up with Dr. Kallie September 2023 with diagnostic imaging, possible RF tag placement and subsequent excision recommended.   EXAM: DIGITAL DIAGNOSTIC BILATERAL MAMMOGRAM WITH TOMOSYNTHESIS; ULTRASOUND RIGHT BREAST LIMITED  MPRESSION: 1.  Suspicious right  axillary lymphadenopathy.   2. Indeterminate intramammary lymph node in the right breast at 9 o'clock.   3. 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   4. Decreased calcifications noted at prior benign biopsy site in the lower central posterior right breast at site of X shaped biopsy marking clip.   02/20/2022 Initial Biopsy   FINAL MICROSCOPIC DIAGNOSIS:   A. AXILLA, RIGHT, LYMPH NODE, NEEDLE CORE BIOPSY:  - Positive for carcinoma (see Comment)   B. LYMPH NODE, RIGHT BREAST, BIOPSY:  - Negative for carcinoma   COMMENT:  Part A: Morphology and immunohistochemical staining are most compatible with primary breast carcinoma with metaplastic changes, however differential diagnosis also includes urothelial carcinoma and less likely primary lung carcinoma (squamous).  No lymphoid tissue is identified.  Clinical and radiologic correlation is suggested.   ADDENDUM:  In case of a breast origin, the appropriate grade would be grade 3  (3+3+2)   ADDENDUM:  PROGNOSTIC INDICATOR RESULTS:  The tumor cells are EQUIVOCAL for Her2 (2+).  Estrogen Receptor:       0%, NEGATIVE  Progesterone Receptor:   0%, NEGATIVE  Proliferation Marker Ki-67:   60%   ADDENDUM:  FLOURESCENCE IN-SITU HYBRIDIZATION RESULTS:  GROUP 5:   HER2 **NEGATIVE**    03/02/2022 Imaging   EXAM: BILATERAL BREAST MRI WITH AND WITHOUT CONTRAST  IMPRESSION: 1. There is a suspicious 3.8 cm area of patchy non mass enhancement in a linear orientation in the slightly upper outer right breast in the mid to posterior depth spanning 3.8 cm.   2. Diffuse skin thickening with enhancement of the skin, concerning for inflammatory breast cancer.   3. Numerous bulky matted lymph nodes in the right axilla, one of which corresponds with the biopsy-proven metastatic lymph node.   4.  No  evidence of left  breast malignancy.   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)   03/08/2022 Cancer Staging   Staging form: Breast, AJCC 8th Edition - Clinical stage from 03/08/2022: Stage IIIC (cT4d, cN2, cM0, G3, ER-, PR-, HER2-) - Signed by Lanny Callander, MD on 03/08/2022 Histologic grading system: 3 grade system   03/19/2022 Pathology Results   Diagnosis Breast, right, needle core biopsy, upper outer quadrant, barbell clip BENIGN BREAST WITH FIBROCYSTIC CHANGES INCLUDING STROMAL FIBROSIS, ADENOSIS AND USUAL DUCT HYPERPLASIA BENIGN FIBROMATOID CHANGE NEGATIVE FOR MICROCALCIFICATIONS NEGATIVE FOR CARCINOMA   03/22/2022 PET scan   IMPRESSION: Bulky hypermetabolic right axillary and subpectoral lymphadenopathy, consistent with metastatic disease.   No other definite sites of metastatic disease identified.   6 mm right lower lobe pulmonary nodule shows no FDG uptake, but is too small to definitively characterize by PET. Recommend continued follow-up by chest CT in 3-4 months.   Aortic Atherosclerosis (ICD10-I70.0).   04/02/2022 - 09/28/2022 Chemotherapy   Patient is on Treatment Plan : BREAST Pembrolizumab  (200) D1 + Carboplatin  (5) D1 + Paclitaxel  (80) D1,8,15 q21d X 4 cycles / Pembrolizumab  (200) D1 + AC D1 q21d x 4 cycles      Genetic Testing   Negative genetic testing. No pathogenic variants identified on the [facility] Common Hereditary Cancers+RNA panel. The report date is 05/31/2022.  The Common Hereditary Cancers Panel + RNA offered by [facility] includes sequencing and/or deletion duplication testing of the following 48 genes: APC*, ATM*, AXIN2, BAP1, BARD1, BMPR1A, BRCA1, BRCA2, BRIP1, CDH1, CDK4, CDKN2A (p14ARF), CDKN2A (p16INK4a), CHEK2, CTNNA1, DICER1*, EPCAM*, FH*, GREM1*, HOXB13, KIT, MBD4, MEN1*, MLH1*, MSH2*, MSH3*, MSH6*, MUTYH, NF1*, NTHL1, PALB2, PDGFRA, PMS2*, POLD1*, POLE, PTEN*, RAD51C, RAD51D, SDHA*, SDHB, SDHC*,  SDHD, SMAD4, SMARCA4, STK11, TP53, TSC1*, TSC2, VHL.    12/12/2022 -  Chemotherapy   Patient is on Treatment Plan : BREAST Pembrolizumab  (200) q21d x 27 weeks        Discussed the use of AI scribe software for clinical note transcription with the patient, who gave verbal consent to proceed.  History of Present Illness Marilyn Ford is a 63 year old female with triple negative right-sided breast cancer, status post multimodal therapy, presenting for routine post-treatment surveillance and monitoring of mild chemotherapy-induced anemia.  She completed over a year of neoadjuvant and adjuvant chemotherapy with oral agents and immunotherapy, right mastectomy with axillary lymph node dissection (2 of 6 nodes positive), and adjuvant radiation, completed in spring 2025. A PET scan earlier in 2025 was unremarkable and a recent left breast mammogram was normal.  She feels well overall and believes she has recovered from treatment. She denies persistent toxicities from chemotherapy, radiation, or surgery, other than occasional sharp pain at the surgical site and prior intermittent photophobia during oral chemotherapy, which has improved. She denies chest pain, right arm numbness, tingling, or swelling. Appetite is normal. She has not undergone breast reconstruction and uses a prosthesis bra.  She has persistent fatigue and low energy and feels treatment took a lot out of her, but denies other symptoms concerning for anemia. Recent labs show mild anemia with hemoglobin 11.8 g/dL, mild thrombocytopenia, and slightly elevated CA 15-3 and CA 27.29. Renal and hepatic function are normal.     All other systems were reviewed with the patient and are negative.  MEDICAL HISTORY:  Past Medical History:  Diagnosis Date   Allergy    Anxiety    Arthritis    Blood transfusion without reported diagnosis  Breast cancer (HCC)    right breast   Diabetes mellitus without complication (HCC)    GERD  (gastroesophageal reflux disease)    Heart murmur    as a child   History of kidney stones    Hypertension    Pneumonia    Stroke [facility])    Vaginal Pap smear, abnormal     SURGICAL HISTORY: Past Surgical History:  Procedure Laterality Date   BREAST BIOPSY  10/31/2022   US  RT RADIOACTIVE SEED LOC 10/31/2022 [facility] MAMMOGRAP[facility]   CESAREAN SECTION     CHOLECYSTECTOMY     COLONOSCOPY WITH PROPOFOL  N/A 07/19/2020   Procedure: COLONOSCOPY WITH PROPOFOL ;  Surgeon: Eartha Angelia Sieving, MD;  Location: [facility];  Service: Gastroenterology;  Laterality: N/A;  AM   MASTECTOMY Right 2024   POLYPECTOMY  07/19/2020   Procedure: POLYPECTOMY;  Surgeon: Eartha Angelia Sieving, MD;  Location: [facility];  Service: Gastroenterology;;   PORTACATH PLACEMENT N/A 03/20/2022   Procedure: INSERTION PORT-A-CATH;  Surgeon: Vernetta Berg, MD;  Location: [facility];  Service: General;  Laterality: N/A;   RADIOACTIVE SEED GUIDED AXILLARY SENTINEL LYMPH NODE Right 11/01/2022   Procedure: RADIOACTIVE SEED GUIDED RIGHT AXILLARY SENTINEL LYMPH NODE DISSECTION;  Surgeon: Vernetta Berg, MD;  Location: [facility];  Service: General;  Laterality: Right;   SIMPLE MASTECTOMY WITH AXILLARY SENTINEL NODE BIOPSY Right 11/01/2022   Procedure: RIGHT SIMPLE MASTECTOMY;  Surgeon: Vernetta Berg, MD;  Location: [facility];  Service: General;  Laterality: Right;    I have reviewed the social history and family history with the patient and they are unchanged from previous note.  ALLERGIES:  is allergic to penicillins and sulfa antibiotics.  MEDICATIONS:  Current Outpatient Medications  Medication Sig Dispense Refill   Alcohol Swabs (B-D SINGLE USE SWABS REGULAR) PADS Test BS daily and as needed Dx E11.9 100 each 3   amLODipine  (NORVASC ) 10 MG tablet Take 1 tablet (10 mg total) by mouth daily. 90 tablet 3   Apple Cider Vinegar 500 MG TABS Take 500 mg by mouth in the morning.      aspirin  81 MG EC tablet Take 1 tablet (81 mg total) by mouth daily. 30 tablet 0   atorvastatin  (LIPITOR ) 80 MG tablet TAKE 1 TABLET EVERY DAY AT 6PM 90 tablet 3   betamethasone  dipropionate 0.05 % cream Apply topically 2 (two) times daily. 30 g 0   Blood Glucose Calibration (TRUE METRIX LEVEL 1) Low SOLN Use with glucometer Dx E11.9 3 each 0   Blood Glucose Monitoring Suppl (TRUE METRIX AIR GLUCOSE METER) w/Device KIT Test BS daily and as needed Dx E11.9 1 kit 0   Calcium  Carb-Cholecalciferol (CALCIUM -VITAMIN D) 600-400 MG-UNIT TABS Take 1 tablet by mouth in the morning.     Coenzyme Q10 (COQ10) 100 MG CAPS Take 100 mg by mouth in the morning.     Cranberry 425 MG CAPS Take 425 mg by mouth in the morning and at bedtime.     Dulaglutide  (TRULICITY ) 3 MG/0.5ML SOAJ Inject 3 mg as directed once a week. 6 mL 1   Flaxseed, Linseed, (FLAXSEED OIL) 1000 MG CAPS Take 1,000 mg by mouth in the morning.     glucose blood (TRUE METRIX BLOOD GLUCOSE TEST) test strip Test BS daily and as needed Dx E11.9 100 each 3   Krill Oil 500 MG CAPS Take 3 capsules (1,500 mg total) by mouth in the morning and at bedtime.     lidocaine  (LIDODERM ) 5 %  Place 1 patch onto the skin daily. Remove & Discard patch within 12 hours or as directed by MD 90 patch 3   lidocaine -prilocaine  (EMLA ) cream Apply 1 Application topically as needed. 30 g 0   meloxicam  (MOBIC ) 15 MG tablet Take 1 tablet (15 mg total) by mouth daily. 90 tablet 0   metoprolol  (TOPROL -XL) 200 MG 24 hr tablet TAKE 1 TABLET ONE TIME DAILY, WITH OR IMMEDIATELY FOLLOWING A MEAL 90 tablet 3   Multiple Vitamin (MULTIVITAMIN) capsule Take 1 capsule by mouth in the morning.     ondansetron  (ZOFRAN ) 8 MG tablet Take 1 tablet (8 mg total) by mouth every 8 (eight) hours as needed for nausea or vomiting. 30 tablet 3   pantoprazole  (PROTONIX ) 40 MG tablet TAKE 1 TABLET twice daily FOR STOMACH 180 tablet 3   traZODone  (DESYREL ) 150 MG tablet TAKE 1 OR 2 TABLETS AT BEDTIME  FOR SLEEP 180 tablet 3   TRUEplus Lancets 33G MISC Test BS daily and as needed Dx E11.9 100 each 3   valsartan  (DIOVAN ) 320 MG tablet Take 1 tablet (320 mg total) by mouth daily. For blood pressure. 90 tablet 3   zinc  gluconate 50 MG tablet Take 50 mg by mouth daily.     Zinc  Oxide 20 % PSTE Apply 1 Application topically 3 (three) times daily. 57 g 1   pregabalin  (LYRICA ) 50 MG capsule 1 qhs X7 days , then 2 qhs X 7d, then 3 qhs X 7d, then 4 qhs (Patient not taking: Reported on 04/14/2024) 120 capsule 1   No current facility-administered medications for this visit.    PHYSICAL EXAMINATION: ECOG PERFORMANCE STATUS: 0 - Asymptomatic  Vitals:   04/14/24 1247  BP: 115/72  Pulse: 96  Resp: 16  Temp: 97.7 F (36.5 C)  SpO2: 97%   Wt Readings from Last 3 Encounters:  04/14/24 169 lb 5 oz (76.8 kg)  03/23/24 169 lb (76.7 kg)  03/02/24 170 lb 8 oz (77.3 kg)     GENERAL:alert, no distress and comfortable SKIN: skin color, texture, turgor are normal, no rashes or significant lesions EYES: normal, Conjunctiva are pink and non-injected, sclera clear NECK: supple, thyroid  normal size, non-tender, without nodularity LYMPH:  no palpable lymphadenopathy in the cervical, axillary  LUNGS: clear to auscultation and percussion with normal breathing effort HEART: regular rate & rhythm and no murmurs and no lower extremity edema ABDOMEN:abdomen soft, non-tender and normal bowel sounds, no organomegaly. Musculoskeletal:no cyanosis of digits and no clubbing  NEURO: alert & oriented x 3 with fluent speech, no focal motor/sensory deficits BREAST: Right mastectomy incision healed well, no nodules on chest wall.  Physical Exam   LABORATORY DATA:  I have reviewed the data as listed    Latest Ref Rng & Units 04/06/2024    1:26 PM 03/23/2024   11:03 AM 12/19/2023    8:48 AM  CBC  WBC 4.0 - 10.5 K/uL 4.5  5.3  3.5   Hemoglobin 12.0 - 15.0 g/dL 88.1  87.3  88.1   Hematocrit 36.0 - 46.0 % 34.8  38.7   36.1   Platelets 150 - 400 K/uL 149  154  140         Latest Ref Rng & Units 04/06/2024    1:26 PM 03/23/2024   11:03 AM 12/19/2023    8:48 AM  CMP  Glucose 70 - 99 mg/dL 878  853  788   BUN 8 - 23 mg/dL 17  16  18    Creatinine  0.44 - 1.00 mg/dL 9.08  9.03  8.94   Sodium 135 - 145 mmol/L 140  140  139   Potassium 3.5 - 5.1 mmol/L 4.3  4.2  4.6   Chloride 98 - 111 mmol/L 102  100  101   CO2 22 - 32 mmol/L 28  23  20    Calcium  8.9 - 10.3 mg/dL 9.5  89.9  9.5   Total Protein 6.5 - 8.1 g/dL 7.2  6.8  7.0   Total Bilirubin 0.0 - 1.2 mg/dL 0.5  0.6  0.6   Alkaline Phos 38 - 126 U/L 118  117  113   AST 15 - 41 U/L 24  20  33   ALT 0 - 44 U/L 26  29  51       RADIOGRAPHIC STUDIES: I have personally reviewed the radiological images as listed and agreed with the findings in the report. No results found.    No orders of the defined types were placed in this encounter.  All questions were answered. The patient knows to call the clinic with any problems, questions or concerns. No barriers to learning was detected. The total time spent in the appointment was 30 minutes, including review of chart and various tests results, discussions about plan of care and coordination of care plan     Onita Mattock, MD 04/14/2024

## 2024-04-14 NOTE — Progress Notes (Signed)
 Port flushed with good blood return noted. No bruising or swelling at site. Bandaid applied and patient discharged in satisfactory condition. VVS stable with no signs or symptoms of distressed noted.

## 2024-04-15 NOTE — Telephone Encounter (Signed)
 PAP: Patient assistance RE-ENROLLMENT application for Trulicity  1.5MG  has been approved by PAP Companies: [employer] from 05/07/24 to 05/06/25.   Medication should be delivered to: Home.   For further shipping updates, please contact [employer] at [phone].

## 2024-04-29 ENCOUNTER — Other Ambulatory Visit (HOSPITAL_COMMUNITY): Payer: Self-pay

## 2024-05-01 ENCOUNTER — Ambulatory Visit: Payer: Self-pay

## 2024-05-01 NOTE — Telephone Encounter (Signed)
 FYI Only or Action Required?: Action required by provider: Requests Cough medication to [facility] today. Appointment scheduled on 05/04/24  Patient was last seen in primary care on 03/23/2024 by [name], MD.  Called Nurse Triage reporting Cough.  Symptoms began several weeks ago.  Interventions attempted: OTC medications: RobitussinDM and Rest, hydration, or home remedies.  Symptoms are: gradually improving.  Triage Disposition: Home Care  Patient/caregiver understands and will follow disposition?: Yes  Reason for Disposition  Cough  Answer Assessment - Initial Assessment Questions Additional info: Calling to schedule appointment on Monday and to request cough medication for this weekend. Cough onset approximately two weeks ago, denies breathing difficulty, she has been using Robitussin DM which helped control her cough but it is persisting and Robitussin is not as effective as it had been at the start of her illness. Her cough has improved since onset but is persisting and productive of discolored mucous. She denies all other symptoms.  She is requesting prescription cough medication to be sent to [facility] today.  Acute appointment scheduled on 05/04/24 as requested.    1. ONSET: When did the cough begin?      Couple weeks ago  2. SEVERITY: How bad is the cough today?      Moderate but improving since start 3. SPUTUM: Describe the color of your sputum (e.g., none, dry cough; clear, white, yellow, green)     green 4. HEMOPTYSIS: Are you coughing up any blood? If Yes, ask: How much? (e.g., flecks, streaks, tablespoons, etc.)     denies 5. DIFFICULTY BREATHING: Are you having difficulty breathing? If Yes, ask: How bad is it? (e.g., mild, moderate, severe)      Denies  6. FEVER: Do you have a fever? If Yes, ask: What is your temperature, how was it measured, and when did it start?     Denies  7. CARDIAC HISTORY: Do you have any history of heart  disease? (e.g., heart attack, congestive heart failure)       8. LUNG HISTORY: Do you have any history of lung disease?  (e.g., pulmonary embolus, asthma, emphysema)      9. PE RISK FACTORS: Do you have a history of blood clots? (or: recent major surgery, recent prolonged travel, bedridden)      10. OTHER SYMPTOMS: Do you have any other symptoms? (e.g., runny nose, wheezing, chest pain)       denies  Protocols used: Cough - Acute Productive-A-AH Message from [place] [facility] sent at 05/01/2024 10:36 AM EST  Summary: Patient has cancer, coughing up green stuff no other symptoms   Reason for Triage: Patient has cancer Patient is coughing up green stuff Asking for OTC cough medicine -no fever -no vomiting/diarrhea -no shortness of breath

## 2024-05-04 ENCOUNTER — Encounter: Payer: Self-pay | Admitting: *Deleted

## 2024-05-04 ENCOUNTER — Encounter: Payer: Self-pay | Admitting: Family Medicine

## 2024-05-04 ENCOUNTER — Ambulatory Visit (INDEPENDENT_AMBULATORY_CARE_PROVIDER_SITE_OTHER)

## 2024-05-04 ENCOUNTER — Ambulatory Visit: Payer: Self-pay | Admitting: Family Medicine

## 2024-05-04 ENCOUNTER — Ambulatory Visit: Admitting: Family Medicine

## 2024-05-04 VITALS — BP 113/74 | HR 83 | Temp 97.7°F | Ht 62.0 in | Wt 173.2 lb

## 2024-05-04 DIAGNOSIS — R051 Acute cough: Secondary | ICD-10-CM | POA: Diagnosis not present

## 2024-05-04 DIAGNOSIS — J208 Acute bronchitis due to other specified organisms: Secondary | ICD-10-CM | POA: Diagnosis not present

## 2024-05-04 DIAGNOSIS — B9689 Other specified bacterial agents as the cause of diseases classified elsewhere: Secondary | ICD-10-CM | POA: Diagnosis not present

## 2024-05-04 MED ORDER — DOXYCYCLINE HYCLATE 100 MG PO TABS: 100.0000 mg | ORAL_TABLET | Freq: Two times a day (BID) | ORAL | 0 refills | Status: AC

## 2024-05-04 MED ORDER — GUAIFENESIN-CODEINE 100-10 MG/5ML PO SOLN: 5.0000 mL | Freq: Two times a day (BID) | ORAL | 0 refills | Status: AC | PRN

## 2024-05-04 MED ORDER — BENZONATATE 200 MG PO CAPS: 200.0000 mg | ORAL_CAPSULE | Freq: Two times a day (BID) | ORAL | 0 refills | Status: AC | PRN

## 2024-05-04 NOTE — Patient Instructions (Signed)
 Acute Bronchitis, Adult  Acute bronchitis is when air tubes in the lungs (bronchi) suddenly get swollen. The condition can make it hard for you to breathe. In adults, acute bronchitis usually goes away within 2 weeks. A cough caused by bronchitis may last up to 3 weeks. Smoking, allergies, and asthma can make the condition worse. What are the causes? Germs that cause cold and flu (viruses). The most common cause of this condition is the virus that causes the common cold. Bacteria. Substances that bother (irritate) the lungs, including: Smoke from cigarettes and other types of tobacco. Dust and pollen. Fumes from chemicals, gases, or burned fuel. Indoor or outdoor air pollution. What increases the risk? A weak body's defense system. This is also called the immune system. Any condition that affects your lungs and breathing, such as asthma. What are the signs or symptoms? A cough. Coughing up clear, yellow, or green mucus. Making high-pitched whistling sounds when you breathe, most often when you breathe out (wheezing). Runny or stuffy nose. Having too much mucus in your lungs (chest congestion). Shortness of breath. Body aches. A sore throat. How is this treated? Acute bronchitis may go away over time without treatment. Your doctor may tell you to: Drink more fluids. This will help thin your mucus so it is easier to cough up. Use a device that gets medicine into your lungs (inhaler). Use a vaporizer or a humidifier. These are machines that add water to the air. This helps with coughing and poor breathing. Take a medicine that thins mucus and helps clear it from your lungs. Take a medicine that prevents or stops coughing. It is not common to take an antibiotic medicine for this condition. Follow these instructions at home:  Take over-the-counter and prescription medicines only as told by your doctor. Use an inhaler, vaporizer, or humidifier as told by your doctor. Take two teaspoons  (10 mL) of honey at bedtime. This helps lessen your coughing at night. Drink enough fluid to keep your pee (urine) pale yellow. Do not smoke or use any products that contain nicotine or tobacco. If you need help quitting, ask your doctor. Get a lot of rest. Return to your normal activities when your doctor says that it is safe. Keep all follow-up visits. How is this prevented?  Wash your hands often with soap and water for at least 20 seconds. If you cannot use soap and water, use hand sanitizer. Avoid contact with people who have cold symptoms. Try not to touch your mouth, nose, or eyes with your hands. Avoid breathing in smoke or chemical fumes. Make sure to get the flu shot every year. Contact a doctor if: Your symptoms do not get better in 2 weeks. You have trouble coughing up the mucus. Your cough keeps you awake at night. You have a fever. Get help right away if: You cough up blood. You have chest pain. You have very bad shortness of breath. You faint or keep feeling like you are going to faint. You have a very bad headache. Your fever or chills get worse. These symptoms may be an emergency. Get help right away. Call your local emergency services (911 in the [place] Do not wait to see if the symptoms will go away. Do not drive yourself to the hospital. Summary Acute bronchitis is when air tubes in the lungs (bronchi) suddenly get swollen. In adults, acute bronchitis usually goes away within 2 weeks. Drink more fluids. This will help thin your mucus so it is easier  to cough up. Take over-the-counter and prescription medicines only as told by your doctor. Contact a doctor if your symptoms do not improve after 2 weeks of treatment. This information is not intended to replace advice given to you by your health care provider. Make sure you discuss any questions you have with your health care provider. Document Revised: 08/24/2020 Document Reviewed: 08/24/2020 Elsevier Patient  Education  2024 [employer].

## 2024-05-04 NOTE — Progress Notes (Signed)
 "  Subjective: RR:Rnlhy PCP: [name], MD YEP:Inwwj Marilyn Ford is a 63 y.o. female presenting to clinic today for:  Patient reports that she has had a cough for about a month now.  It seemed to start after her husband was cutting some wood and is just a dry cough then but over the last week or so she has been having production with green sputum.  She reports fevers and chills at the beginning of the cough but that is since resolved.  She gets short of breath only when she has coughing spells but is not having any wheezing or shortness of breath with normal activities.  Not undergoing any active treatment for cancer but is being surveyed closely.  She denies any history of COPD or asthma and is a non-smoker.  She does report some mild drainage and is not taking any antihistamines but does have some at home that she can use.   ROS: Per HPI  Allergies[1] Past Medical History:  Diagnosis Date   Allergy    Anxiety    Arthritis    Blood transfusion without reported diagnosis    Breast cancer (HCC)    right breast   Diabetes mellitus without complication (HCC)    GERD (gastroesophageal reflux disease)    Heart murmur    as a child   History of kidney stones    Hypertension    Pneumonia    Stroke (HCC)    Vaginal Pap smear, abnormal    Current Medications[2] Social History   Socioeconomic History   Marital status: Married    Spouse name: Lynwood   Number of children: 2   Years of education: 10   Highest education level: 10th grade  Occupational History   Occupation: disabled  Tobacco Use   Smoking status: Former    Current packs/day: 0.00    Average packs/day: 0.5 packs/day for 25.0 years (12.5 ttl pk-yrs)    Types: Cigarettes    Start date: 12/08/1999    Quit date: 03/12/2015    Years since quitting: 9.1   Smokeless tobacco: Never  Vaping Use   Vaping status: Never Used  Substance and Sexual Activity   Alcohol use: Yes    Alcohol/week: 2.0 - 3.0 standard drinks of  alcohol    Types: 2 - 3 Glasses of wine per week   Drug use: No   Sexual activity: Not Currently    Birth control/protection: Abstinence, Post-menopausal  Other Topics Concern   Not on file  Social History Narrative   Volunteers at [employer] for a few hours every day.    She really enjoys getting out of of the house and working there.    Social Drivers of Health   Tobacco Use: Medium Risk (03/23/2024)   Patient History    Smoking Tobacco Use: Former    Smokeless Tobacco Use: Never    Passive Exposure: Not on file  Financial Resource Strain: Low Risk (02/18/2024)   Overall Financial Resource Strain (CARDIA)    Difficulty of Paying Living Expenses: Not very hard  Food Insecurity: No Food Insecurity (02/18/2024)   Epic    Worried About Programme Researcher, Broadcasting/film/video in the Last Year: Never true    Ran Out of Food in the Last Year: Never true  Transportation Needs: No Transportation Needs (02/18/2024)   Epic    Lack of Transportation (Medical): No    Lack of Transportation (Non-Medical): No  Physical Activity: Insufficiently Active (02/18/2024)   Exercise Vital Sign  Days of Exercise per Week: 4 days    Minutes of Exercise per Session: 30 min  Stress: No Stress Concern Present (02/18/2024)   [employer] of Occupational Health - Occupational Stress Questionnaire    Feeling of Stress: Not at all  Recent Concern: Stress - Stress Concern Present (11/28/2023)   [employer] of Occupational Health - Occupational Stress Questionnaire    Feeling of Stress: To some extent  Social Connections: Moderately Isolated (02/18/2024)   Social Connection and Isolation Panel    Frequency of Communication with Friends and Family: More than three times a week    Frequency of Social Gatherings with Friends and Family: Three times a week    Attends Religious Services: Patient declined    Active Member of Clubs or Organizations: No    Attends Banker Meetings: Not on file     Marital Status: Married  Intimate Partner Violence: Not At Risk (09/09/2023)   Humiliation, Afraid, Rape, and Kick questionnaire    Fear of Current or Ex-Partner: No    Emotionally Abused: No    Physically Abused: No    Sexually Abused: No  Depression (PHQ2-9): Medium Risk (04/14/2024)   Depression (PHQ2-9)    PHQ-2 Score: 8  Alcohol Screen: Low Risk (02/18/2024)   Alcohol Screen    Last Alcohol Screening Score (AUDIT): 1  Housing: Low Risk (02/18/2024)   Epic    Unable to Pay for Housing in the Last Year: No    Number of Times Moved in the Last Year: 0    Homeless in the Last Year: No  Utilities: Not At Risk (09/09/2023)   [facility] Utilities    Threatened with loss of utilities: No  Health Literacy: Adequate Health Literacy (09/09/2023)   B1300 Health Literacy    Frequency of need for help with medical instructions: Never   Family History  Problem Relation Age of Onset   Diabetes Mother    Uterine cancer Mother 8 - 24   Diabetes Brother    Breast cancer Maternal Aunt        dx >50, d. from cancer   Cancer Maternal Grandmother        unk type, back cancer?    Objective: Office vital signs reviewed. BP 113/74   Pulse 83   Temp 97.7 F (36.5 C)   Ht 5' 2 (1.575 m)   Wt 173 lb 4 oz (78.6 kg)   SpO2 96%   BMI 31.69 kg/m   Physical Examination:  General: Awake, alert, well nourished, No acute distress HEENT: Normal    Neck: No masses palpated. No lymphadenopathy    Ears: Tympanic membranes intact, normal light reflex, no erythema, no bulging    Eyes: PERRLA, extraocular membranes intact, sclera white    Nose: nasal turbinates moist, clear nasal discharge    Throat: moist mucus membranes, no erythema, no tonsillar exudate.  Airway is patent Cardio: regular rate and rhythm, S1S2 heard, no murmurs appreciated Pulm: clear to auscultation bilaterally, no wheezes, rhonchi or rales; normal work of breathing on room air.  Intermittent wet sounding cough  No results  found.   Assessment/ Plan: 63 y.o. female   Acute bacterial bronchitis - Plan: DG Chest 2 View, doxycycline  (VIBRA -TABS) 100 MG tablet, guaiFENesin -codeine 100-10 MG/5ML syrup, benzonatate  (TESSALON ) 200 MG capsule   Will obtain chest x-ray given cancer status to ensure that we do not have a pneumonia but I suspect based on pulmonary exam and symptomology this is likely an acute bacterial  bronchitis superimposed on what was likely a postviral cough.  Doxycycline  sent for treatment, guaifenesin  for cough suppressant at bedtime and Tessalon  Perles for daytime use.  We discussed red flag signs symptoms warranting further evaluation.  Follow-up as needed  Personal review of xray negative for PNA. Formal review by rads pending   Marilyn CHRISTELLA Fielding, DO [facility] [phone]     [1]  Allergies Allergen Reactions   Penicillins Shortness Of Breath   Sulfa Antibiotics Rash  [2]  Current Outpatient Medications:    Alcohol Swabs (B-D SINGLE USE SWABS REGULAR) PADS, Test BS daily and as needed Dx E11.9, Disp: 100 each, Rfl: 3   amLODipine  (NORVASC ) 10 MG tablet, Take 1 tablet (10 mg total) by mouth daily., Disp: 90 tablet, Rfl: 3   Apple Cider Vinegar 500 MG TABS, Take 500 mg by mouth in the morning., Disp: , Rfl:    aspirin  81 MG EC tablet, Take 1 tablet (81 mg total) by mouth daily., Disp: 30 tablet, Rfl: 0   atorvastatin  (LIPITOR ) 80 MG tablet, TAKE 1 TABLET EVERY DAY AT 6PM, Disp: 90 tablet, Rfl: 3   betamethasone  dipropionate 0.05 % cream, Apply topically 2 (two) times daily., Disp: 30 g, Rfl: 0   Blood Glucose Calibration (TRUE METRIX LEVEL 1) Low SOLN, Use with glucometer Dx E11.9, Disp: 3 each, Rfl: 0   Blood Glucose Monitoring Suppl (TRUE METRIX AIR GLUCOSE METER) w/Device KIT, Test BS daily and as needed Dx E11.9, Disp: 1 kit, Rfl: 0   Calcium  Carb-Cholecalciferol (CALCIUM -VITAMIN D) 600-400 MG-UNIT TABS, Take 1 tablet by mouth in the morning., Disp: , Rfl:     Coenzyme Q10 (COQ10) 100 MG CAPS, Take 100 mg by mouth in the morning., Disp: , Rfl:    Cranberry 425 MG CAPS, Take 425 mg by mouth in the morning and at bedtime., Disp: , Rfl:    Dulaglutide  (TRULICITY ) 3 MG/0.5ML SOAJ, Inject 3 mg as directed once a week., Disp: 6 mL, Rfl: 1   Flaxseed, Linseed, (FLAXSEED OIL) 1000 MG CAPS, Take 1,000 mg by mouth in the morning., Disp: , Rfl:    glucose blood (TRUE METRIX BLOOD GLUCOSE TEST) test strip, Test BS daily and as needed Dx E11.9, Disp: 100 each, Rfl: 3   Krill Oil 500 MG CAPS, Take 3 capsules (1,500 mg total) by mouth in the morning and at bedtime., Disp: , Rfl:    lidocaine  (LIDODERM ) 5 %, Place 1 patch onto the skin daily. Remove & Discard patch within 12 hours or as directed by MD, Disp: 90 patch, Rfl: 3   lidocaine -prilocaine  (EMLA ) cream, Apply 1 Application topically as needed., Disp: 30 g, Rfl: 0   meloxicam  (MOBIC ) 15 MG tablet, Take 1 tablet (15 mg total) by mouth daily., Disp: 90 tablet, Rfl: 0   metoprolol  (TOPROL -XL) 200 MG 24 hr tablet, TAKE 1 TABLET ONE TIME DAILY, WITH OR IMMEDIATELY FOLLOWING A MEAL, Disp: 90 tablet, Rfl: 3   Multiple Vitamin (MULTIVITAMIN) capsule, Take 1 capsule by mouth in the morning., Disp: , Rfl:    ondansetron  (ZOFRAN ) 8 MG tablet, Take 1 tablet (8 mg total) by mouth every 8 (eight) hours as needed for nausea or vomiting., Disp: 30 tablet, Rfl: 3   pantoprazole  (PROTONIX ) 40 MG tablet, TAKE 1 TABLET twice daily FOR STOMACH, Disp: 180 tablet, Rfl: 3   pregabalin  (LYRICA ) 50 MG capsule, 1 qhs X7 days , then 2 qhs X 7d, then 3 qhs X 7d, then 4 qhs (Patient  not taking: Reported on 04/14/2024), Disp: 120 capsule, Rfl: 1   traZODone  (DESYREL ) 150 MG tablet, TAKE 1 OR 2 TABLETS AT BEDTIME FOR SLEEP, Disp: 180 tablet, Rfl: 3   TRUEplus Lancets 33G MISC, Test BS daily and as needed Dx E11.9, Disp: 100 each, Rfl: 3   valsartan  (DIOVAN ) 320 MG tablet, Take 1 tablet (320 mg total) by mouth daily. For blood pressure., Disp:  90 tablet, Rfl: 3   zinc  gluconate 50 MG tablet, Take 50 mg by mouth daily., Disp: , Rfl:    Zinc  Oxide 20 % PSTE, Apply 1 Application topically 3 (three) times daily., Disp: 57 g, Rfl: 1  "

## 2024-05-04 NOTE — Telephone Encounter (Signed)
"  Appt made   "

## 2024-05-21 ENCOUNTER — Ambulatory Visit: Payer: Self-pay

## 2024-05-21 NOTE — Telephone Encounter (Signed)
 FYI Only or Action Required?: FYI only for provider: appointment scheduled on 1/16.  Patient was last seen in primary care on 05/04/2024 by Jolinda Norene HERO, DO.  Called Nurse Triage reporting Dysuria.  Symptoms began several days ago.  Interventions attempted: Nothing.  Symptoms are: gradually worsening.  Triage Disposition: See HCP Within 4 Hours (Or PCP Triage)  Patient/caregiver understands and will follow disposition?: No, refuses disposition   -Since the weekend has burning with urination, 7/10 burning pain -Dark brown urine, no blood, no fever. No flank pain.  -Advised to be seen today. Declines. Would like to schedule appt tomorrow. Scheduled appt with different provider at home office tomorrow d/t no PCP availability within timeframe.      Copied from CRM [id number]. Topic: Clinical - Red Word Triage >> May 21, 2024  4:30 PM Mercer PEDLAR wrote: Red Word that prompted transfer to Nurse Triage: blood in urine, pain with urination. Reason for Disposition  Diabetes mellitus or weak immune system (e.g., HIV positive, cancer chemo, splenectomy, organ transplant, chronic steroids)  Answer Assessment - Initial Assessment Questions 1. SEVERITY: How bad is the pain?  (e.g., Scale 1-10; mild, moderate, or severe)     7/10 pain  2. FREQUENCY: How many times have you had painful urination today?      Increased frequency, pain only occurs sometimes  3. PATTERN: Is pain present every time you urinate or just sometimes?      Pain only occurs sometimes  4. ONSET: When did the painful urination start?      Since the weekend  5. FEVER: Do you have a fever? If Yes, ask: What is your temperature, how was it measured, and when did it start?     Denies  6. PAST UTI: Have you had a urine infection before? If Yes, ask: When was the last time? and What happened that time?      Denies  7. CAUSE: What do you think is causing the painful urination?  (e.g., UTI, scratch,  Herpes sore)     Unsure  8. OTHER SYMPTOMS: Do you have any other symptoms? (e.g., blood in urine, flank pain, genital sores, urgency, vaginal discharge)     Dark brown urine  Protocols used: Urination Pain - Female-A-AH

## 2024-05-21 NOTE — Telephone Encounter (Signed)
 Apt scheduled.

## 2024-05-22 ENCOUNTER — Encounter: Payer: Self-pay | Admitting: Family Medicine

## 2024-05-22 ENCOUNTER — Ambulatory Visit (INDEPENDENT_AMBULATORY_CARE_PROVIDER_SITE_OTHER): Admitting: Family Medicine

## 2024-05-22 ENCOUNTER — Ambulatory Visit: Payer: Self-pay | Admitting: Family Medicine

## 2024-05-22 VITALS — BP 121/81 | HR 85 | Temp 97.3°F | Ht 62.0 in | Wt 171.0 lb

## 2024-05-22 DIAGNOSIS — R300 Dysuria: Secondary | ICD-10-CM | POA: Diagnosis not present

## 2024-05-22 LAB — URINALYSIS, ROUTINE W REFLEX MICROSCOPIC
Bilirubin, UA: NEGATIVE
Glucose, UA: NEGATIVE
Ketones, UA: NEGATIVE
Leukocytes,UA: NEGATIVE
Nitrite, UA: NEGATIVE
Protein,UA: NEGATIVE
RBC, UA: NEGATIVE
Specific Gravity, UA: 1.015 (ref 1.005–1.030)
Urobilinogen, Ur: 0.2 mg/dL (ref 0.2–1.0)
pH, UA: 7 (ref 5.0–7.5)

## 2024-05-22 NOTE — Progress Notes (Signed)
 "    Subjective:  Patient ID: Marilyn Ford, female    DOB: 1960-07-15, 64 y.o.   MRN: [id number]  Patient Care Team: [name], MD as PCP - General (Family Medicine) [name], MD (Family Medicine) Billee Mliss BIRCH, RPH-CPP as Pharmacist (Family Medicine) Eartha Flavors, Toribio, MD as Consulting Physician (Gastroenterology) Mavis Anes, MD as Consulting Physician (General Surgery) Celestia Joesph SQUIBB, RN as Oncology Nurse Navigator (Medical Oncology) [name], MD as Referring Physician (Family Medicine)   Chief Complaint:  Dysuria (X 1 week on and off.  )   HPI: Marilyn Ford is a 64 y.o. female presenting on 05/22/2024 for Dysuria (X 1 week on and off.  )    Marilyn Ford is a 64 year old female who presents with urinary symptoms and irritability.  For the past week, she has experienced irritability towards everyone, which is unusual for her.  She has been experiencing burning during urination and has noticed a small amount of brown discharge on her pad. She mentions that she has not had her period in 11 years and describes the discharge as similar to the beginning of a menstrual period. No vaginal discharge.  She reports frequent urination but denies fever, chills, or significant lower back pain, attributing any back discomfort to bending over frequently. No weakness or confusion.  She has been eating and drinking normally and has been urinating normally aside from the burning sensation.  She was advised by a nurse from [employer] to use over-the-counter Azo for her symptoms.          Relevant past medical, surgical, family, and social history reviewed and updated as indicated.  Allergies and medications reviewed and updated. Data reviewed: Chart in Epic.   Past Medical History:  Diagnosis Date   Allergy    Anxiety    Arthritis    Blood transfusion without reported diagnosis    Breast cancer (HCC)    right breast   Diabetes mellitus without  complication (HCC)    GERD (gastroesophageal reflux disease)    Heart murmur    as a child   History of kidney stones    Hypertension    Pneumonia    Stroke [facility])    Vaginal Pap smear, abnormal     Past Surgical History:  Procedure Laterality Date   BREAST BIOPSY  10/31/2022   US  RT RADIOACTIVE SEED LOC 10/31/2022 [facility]   CESAREAN SECTION     CHOLECYSTECTOMY     COLONOSCOPY WITH PROPOFOL  N/A 07/19/2020   Procedure: COLONOSCOPY WITH PROPOFOL ;  Surgeon: Eartha Flavors Toribio, MD;  Location: [facility];  Service: Gastroenterology;  Laterality: N/A;  AM   MASTECTOMY Right 2024   POLYPECTOMY  07/19/2020   Procedure: POLYPECTOMY;  Surgeon: Eartha Flavors Toribio, MD;  Location: [facility];  Service: Gastroenterology;;   PORTACATH PLACEMENT N/A 03/20/2022   Procedure: INSERTION PORT-A-CATH;  Surgeon: Vernetta Berg, MD;  Location: [facility];  Service: General;  Laterality: N/A;   RADIOACTIVE SEED GUIDED AXILLARY SENTINEL LYMPH NODE Right 11/01/2022   Procedure: RADIOACTIVE SEED GUIDED RIGHT AXILLARY SENTINEL LYMPH NODE DISSECTION;  Surgeon: Vernetta Berg, MD;  Location: [facility];  Service: General;  Laterality: Right;   SIMPLE MASTECTOMY WITH AXILLARY SENTINEL NODE BIOPSY Right 11/01/2022   Procedure: RIGHT SIMPLE MASTECTOMY;  Surgeon: Vernetta Berg, MD;  Location: [facility];  Service: General;  Laterality: Right;    Social History   Socioeconomic History   Marital status:  Married    Spouse name: Lynwood   Number of children: 2   Years of education: 10   Highest education level: 10th grade  Occupational History   Occupation: disabled  Tobacco Use   Smoking status: Former    Current packs/day: 0.00    Average packs/day: 0.5 packs/day for 25.0 years (12.5 ttl pk-yrs)    Types: Cigarettes    Start date: 12/08/1999    Quit date: 03/12/2015    Years since quitting: 9.2   Smokeless tobacco: Never  Vaping Use   Vaping  status: Never Used  Substance and Sexual Activity   Alcohol use: Yes    Alcohol/week: 2.0 - 3.0 standard drinks of alcohol    Types: 2 - 3 Glasses of wine per week   Drug use: No   Sexual activity: Not Currently    Birth control/protection: Abstinence, Post-menopausal  Other Topics Concern   Not on file  Social History Narrative   Volunteers at [employer] for a few hours every day.    She really enjoys getting out of of the house and working there.    Social Drivers of Health   Tobacco Use: Medium Risk (05/22/2024)   Patient History    Smoking Tobacco Use: Former    Smokeless Tobacco Use: Never    Passive Exposure: Not on file  Financial Resource Strain: Low Risk (02/18/2024)   Overall Financial Resource Strain (CARDIA)    Difficulty of Paying Living Expenses: Not very hard  Food Insecurity: No Food Insecurity (02/18/2024)   Epic    Worried About Programme Researcher, Broadcasting/film/video in the Last Year: Never true    Ran Out of Food in the Last Year: Never true  Transportation Needs: No Transportation Needs (02/18/2024)   Epic    Lack of Transportation (Medical): No    Lack of Transportation (Non-Medical): No  Physical Activity: Insufficiently Active (02/18/2024)   Exercise Vital Sign    Days of Exercise per Week: 4 days    Minutes of Exercise per Session: 30 min  Stress: No Stress Concern Present (02/18/2024)   [employer] of Occupational [employer] - Occupational Stress Questionnaire    Feeling of Stress: Not at all  Recent Concern: Stress - Stress Concern Present (11/28/2023)   [employer] of Occupational [employer] - Occupational Stress Questionnaire    Feeling of Stress: To some extent  Social Connections: Moderately Isolated (02/18/2024)   Social Connection and Isolation Panel    Frequency of Communication with Friends and Family: More than three times a week    Frequency of Social Gatherings with Friends and Family: Three times a week    Attends Religious Services:  Patient declined    Active Member of Clubs or Organizations: No    Attends Banker Meetings: Not on file    Marital Status: Married  Intimate Partner Violence: Not At Risk (09/09/2023)   Humiliation, Afraid, Rape, and Kick questionnaire    Fear of Current or Ex-Partner: No    Emotionally Abused: No    Physically Abused: No    Sexually Abused: No  Depression (PHQ2-9): Medium Risk (05/04/2024)   Depression (PHQ2-9)    PHQ-2 Score: 7  Alcohol Screen: Low Risk (02/18/2024)   Alcohol Screen    Last Alcohol Screening Score (AUDIT): 1  Housing: Low Risk (02/18/2024)   Epic    Unable to Pay for Housing in the Last Year: No    Number of Times Moved in the Last Year: 0  Homeless in the Last Year: No  Utilities: Not At Risk (09/09/2023)   AHC Utilities    Threatened with loss of utilities: No  Health Literacy: Adequate Health Literacy (09/09/2023)   B1300 Health Literacy    Frequency of need for help with medical instructions: Never    Outpatient Encounter Medications as of 05/22/2024  Medication Sig   Alcohol Swabs (B-D SINGLE USE SWABS REGULAR) PADS Test BS daily and as needed Dx E11.9   amLODipine  (NORVASC ) 10 MG tablet Take 1 tablet (10 mg total) by mouth daily.   Apple Cider Vinegar 500 MG TABS Take 500 mg by mouth in the morning.   aspirin  81 MG EC tablet Take 1 tablet (81 mg total) by mouth daily.   atorvastatin  (LIPITOR ) 80 MG tablet TAKE 1 TABLET EVERY DAY AT 6PM   benzonatate  (TESSALON ) 200 MG capsule Take 1 capsule (200 mg total) by mouth 2 (two) times daily as needed for cough.   betamethasone  dipropionate 0.05 % cream Apply topically 2 (two) times daily.   Blood Glucose Calibration (TRUE METRIX LEVEL 1) Low SOLN Use with glucometer Dx E11.9   Blood Glucose Monitoring Suppl (TRUE METRIX AIR GLUCOSE METER) w/Device KIT Test BS daily and as needed Dx E11.9   Calcium  Carb-Cholecalciferol (CALCIUM -VITAMIN D) 600-400 MG-UNIT TABS Take 1 tablet by mouth in the morning.    Coenzyme Q10 (COQ10) 100 MG CAPS Take 100 mg by mouth in the morning.   Cranberry 425 MG CAPS Take 425 mg by mouth in the morning and at bedtime.   Dulaglutide  (TRULICITY ) 3 MG/0.5ML SOAJ Inject 3 mg as directed once a week.   Flaxseed, Linseed, (FLAXSEED OIL) 1000 MG CAPS Take 1,000 mg by mouth in the morning.   glucose blood (TRUE METRIX BLOOD GLUCOSE TEST) test strip Test BS daily and as needed Dx E11.9   guaiFENesin -codeine  100-10 MG/5ML syrup Take 5 mLs by mouth 2 (two) times daily as needed for cough.   Krill Oil 500 MG CAPS Take 3 capsules (1,500 mg total) by mouth in the morning and at bedtime.   lidocaine  (LIDODERM ) 5 % Place 1 patch onto the skin daily. Remove & Discard patch within 12 hours or as directed by MD   lidocaine -prilocaine  (EMLA ) cream Apply 1 Application topically as needed.   meloxicam  (MOBIC ) 15 MG tablet Take 1 tablet (15 mg total) by mouth daily.   metoprolol  (TOPROL -XL) 200 MG 24 hr tablet TAKE 1 TABLET ONE TIME DAILY, WITH OR IMMEDIATELY FOLLOWING A MEAL   Multiple Vitamin (MULTIVITAMIN) capsule Take 1 capsule by mouth in the morning.   ondansetron  (ZOFRAN ) 8 MG tablet Take 1 tablet (8 mg total) by mouth every 8 (eight) hours as needed for nausea or vomiting.   pantoprazole  (PROTONIX ) 40 MG tablet TAKE 1 TABLET twice daily FOR STOMACH   pregabalin  (LYRICA ) 50 MG capsule 1 qhs X7 days , then 2 qhs X 7d, then 3 qhs X 7d, then 4 qhs   traZODone  (DESYREL ) 150 MG tablet TAKE 1 OR 2 TABLETS AT BEDTIME FOR SLEEP   TRUEplus Lancets 33G MISC Test BS daily and as needed Dx E11.9   valsartan  (DIOVAN ) 320 MG tablet Take 1 tablet (320 mg total) by mouth daily. For blood pressure.   zinc  gluconate 50 MG tablet Take 50 mg by mouth daily.   Zinc  Oxide 20 % PSTE Apply 1 Application topically 3 (three) times daily.   No facility-administered encounter medications on file as of 05/22/2024.    Allergies[1]  Pertinent ROS per HPI, otherwise unremarkable      Objective:  BP  121/81   Pulse 85   Temp (!) 97.3 F (36.3 C)   Ht 5' 2 (1.575 m)   Wt 171 lb (77.6 kg)   SpO2 97%   BMI 31.28 kg/m    Wt Readings from Last 3 Encounters:  05/22/24 171 lb (77.6 kg)  05/04/24 173 lb 4 oz (78.6 kg)  04/14/24 169 lb 5 oz (76.8 kg)    Physical Exam Vitals and nursing note reviewed.  Constitutional:      General: She is not in acute distress.    Appearance: Normal appearance. She is obese. She is not ill-appearing, toxic-appearing or diaphoretic.  HENT:     Head: Normocephalic and atraumatic.     Nose: Nose normal.     Mouth/Throat:     Mouth: Mucous membranes are moist.  Eyes:     Pupils: Pupils are equal, round, and reactive to light.  Cardiovascular:     Rate and Rhythm: Normal rate and regular rhythm.     Heart sounds: Normal heart sounds.  Pulmonary:     Effort: Pulmonary effort is normal.     Breath sounds: Normal breath sounds.  Abdominal:     General: Bowel sounds are normal.     Palpations: Abdomen is soft.     Tenderness: There is no right CVA tenderness or left CVA tenderness.  Skin:    General: Skin is warm and dry.     Capillary Refill: Capillary refill takes less than 2 seconds.  Neurological:     General: No focal deficit present.     Mental Status: She is oriented to person, place, and time.  Psychiatric:        Mood and Affect: Mood normal.        Behavior: Behavior normal.        Thought Content: Thought content normal.        Judgment: Judgment normal.      Results for orders placed or performed in visit on 04/06/24  Cancer antigen 27.29   Collection Time: 04/06/24  1:26 PM  Result Value Ref Range   CA 27.29 60.7 (H) 0.0 - 38.6 U/mL  Cancer antigen 15-3   Collection Time: 04/06/24  1:26 PM  Result Value Ref Range   CA 15-3 38.3 (H) 0.0 - 25.0 U/mL  Magnesium    Collection Time: 04/06/24  1:26 PM  Result Value Ref Range   Magnesium  2.0 1.7 - 2.4 mg/dL  Comprehensive metabolic panel with GFR   Collection Time: 04/06/24   1:26 PM  Result Value Ref Range   Sodium 140 135 - 145 mmol/L   Potassium 4.3 3.5 - 5.1 mmol/L   Chloride 102 98 - 111 mmol/L   CO2 28 22 - 32 mmol/L   Glucose, Bld 121 (H) 70 - 99 mg/dL   BUN 17 8 - 23 mg/dL   Creatinine, Ser 9.08 0.44 - 1.00 mg/dL   Calcium  9.5 8.9 - 10.3 mg/dL   Total Protein 7.2 6.5 - 8.1 g/dL   Albumin 4.3 3.5 - 5.0 g/dL   AST 24 15 - 41 U/L   ALT 26 0 - 44 U/L   Alkaline Phosphatase 118 38 - 126 U/L   Total Bilirubin 0.5 0.0 - 1.2 mg/dL   GFR, Estimated >39 >39 mL/min   Anion gap 11 5 - 15  CBC with Differential/Platelet   Collection Time: 04/06/24  1:26 PM  Result Value  Ref Range   WBC 4.5 4.0 - 10.5 K/uL   RBC 3.58 (L) 3.87 - 5.11 MIL/uL   Hemoglobin 11.8 (L) 12.0 - 15.0 g/dL   HCT 65.1 (L) 63.9 - 53.9 %   MCV 97.2 80.0 - 100.0 fL   MCH 33.0 26.0 - 34.0 pg   MCHC 33.9 30.0 - 36.0 g/dL   RDW 85.9 88.4 - 84.4 %   Platelets 149 (L) 150 - 400 K/uL   nRBC 0.0 0.0 - 0.2 %   Neutrophils Relative % 76 %   Neutro Abs 3.4 1.7 - 7.7 K/uL   Lymphocytes Relative 14 %   Lymphs Abs 0.6 (L) 0.7 - 4.0 K/uL   Monocytes Relative 8 %   Monocytes Absolute 0.4 0.1 - 1.0 K/uL   Eosinophils Relative 1 %   Eosinophils Absolute 0.1 0.0 - 0.5 K/uL   Basophils Relative 1 %   Basophils Absolute 0.0 0.0 - 0.1 K/uL   Immature Granulocytes 0 %   Abs Immature Granulocytes 0.01 0.00 - 0.07 K/uL   *Note: Due to a large number of results and/or encounters for the requested time period, some results have not been displayed. A complete set of results can be found in Results Review.       Pertinent labs & imaging results that were available during my care of the patient were reviewed by me and considered in my medical decision making.  Assessment & Plan:  Marilyn Ford was seen today for dysuria.  Diagnoses and all orders for this visit:  Dysuria -     Urine Culture -     Urinalysis, Routine w reflex microscopic     Dysuria Past week with burning sensation during urination and  brownish discharge on pad. No fever, chills, flank pain, vaginal discharge, weakness, or confusion. Urinalysis shows no blood or significant findings, but culture is pending to rule out bacterial infection. Differential includes urinary tract infection. - Ordered urine culture to check for bacterial infection - Advised to increase water intake - Advised to avoid caffeine to reduce bladder irritation - Instructed to report any worsening symptoms or new symptoms          Continue all other maintenance medications.  Follow up plan: Return if symptoms worsen or fail to improve.   Continue healthy lifestyle choices, including diet (rich in fruits, vegetables, and lean proteins, and low in salt and simple carbohydrates) and exercise (at least 30 minutes of moderate physical activity daily).   The above assessment and management plan was discussed with the patient. The patient verbalized understanding of and has agreed to the management plan. Patient is aware to call the clinic if they develop any new symptoms or if symptoms persist or worsen. Patient is aware when to return to the clinic for a follow-up visit. Patient educated on when it is appropriate to go to the emergency department.   Marilyn Bruns, FNP-C [facility] [phone]     [1]  Allergies Allergen Reactions   Penicillins Shortness Of Breath   Sulfa Antibiotics Rash   "

## 2024-05-24 LAB — URINE CULTURE

## 2024-06-01 ENCOUNTER — Ambulatory Visit

## 2024-06-24 ENCOUNTER — Ambulatory Visit: Admitting: Family Medicine

## 2024-06-29 ENCOUNTER — Ambulatory Visit

## 2024-07-07 ENCOUNTER — Inpatient Hospital Stay

## 2024-07-14 ENCOUNTER — Inpatient Hospital Stay: Admitting: Oncology

## 2024-08-24 ENCOUNTER — Inpatient Hospital Stay

## 2024-08-31 ENCOUNTER — Inpatient Hospital Stay: Admitting: Oncology

## 2024-09-09 ENCOUNTER — Ambulatory Visit

## 2024-09-10 ENCOUNTER — Ambulatory Visit: Payer: Self-pay
